# Patient Record
Sex: Female | Born: 1950
Health system: Southern US, Community
[De-identification: ages and names within clinical notes are randomized; demographics above are authoritative.]

## PROBLEM LIST (undated history)

## (undated) DIAGNOSIS — T7840XA Allergy, unspecified, initial encounter: Secondary | ICD-10-CM

## (undated) DIAGNOSIS — M545 Low back pain, unspecified: Secondary | ICD-10-CM

## (undated) DIAGNOSIS — E785 Hyperlipidemia, unspecified: Secondary | ICD-10-CM

## (undated) DIAGNOSIS — E1159 Type 2 diabetes mellitus with other circulatory complications: Secondary | ICD-10-CM

## (undated) DIAGNOSIS — K921 Melena: Secondary | ICD-10-CM

## (undated) DIAGNOSIS — R609 Edema, unspecified: Secondary | ICD-10-CM

## (undated) DIAGNOSIS — J302 Other seasonal allergic rhinitis: Secondary | ICD-10-CM

## (undated) DIAGNOSIS — M179 Osteoarthritis of knee, unspecified: Secondary | ICD-10-CM

## (undated) DIAGNOSIS — H8110 Benign paroxysmal vertigo, unspecified ear: Secondary | ICD-10-CM

## (undated) DIAGNOSIS — E669 Obesity, unspecified: Secondary | ICD-10-CM

## (undated) DIAGNOSIS — M1710 Unilateral primary osteoarthritis, unspecified knee: Secondary | ICD-10-CM

## (undated) DIAGNOSIS — K219 Gastro-esophageal reflux disease without esophagitis: Secondary | ICD-10-CM

## (undated) DIAGNOSIS — F32A Depression, unspecified: Secondary | ICD-10-CM

## (undated) DIAGNOSIS — E1169 Type 2 diabetes mellitus with other specified complication: Secondary | ICD-10-CM

## (undated) DIAGNOSIS — M7640 Tibial collateral bursitis [Pellegrini-Stieda], unspecified leg: Secondary | ICD-10-CM

## (undated) DIAGNOSIS — B029 Zoster without complications: Secondary | ICD-10-CM

## (undated) DIAGNOSIS — F329 Major depressive disorder, single episode, unspecified: Secondary | ICD-10-CM

## (undated) DIAGNOSIS — I10 Essential (primary) hypertension: Secondary | ICD-10-CM

## (undated) HISTORY — DX: Melena: K92.1

## (undated) HISTORY — DX: Low back pain: M54.5

## (undated) HISTORY — PX: OTHER SURGICAL HISTORY: SHX169

## (undated) HISTORY — PX: WISDOM TOOTH EXTRACTION: SHX21

## (undated) HISTORY — DX: Low back pain, unspecified: M54.50

## (undated) HISTORY — DX: Gastro-esophageal reflux disease without esophagitis: K21.9

## (undated) HISTORY — DX: Allergy, unspecified, initial encounter: T78.40XA

## (undated) HISTORY — DX: Unilateral primary osteoarthritis, unspecified knee: M17.10

## (undated) HISTORY — DX: Type 2 diabetes mellitus with other specified complication: E11.69

## (undated) HISTORY — DX: Essential (primary) hypertension: I10

## (undated) HISTORY — DX: Tibial collateral bursitis (Pellegrini-Stieda), unspecified leg: M76.40

## (undated) HISTORY — DX: Hyperlipidemia, unspecified: E78.5

## (undated) HISTORY — DX: Osteoarthritis of knee, unspecified: M17.9

## (undated) HISTORY — DX: Benign paroxysmal vertigo, unspecified ear: H81.10

## (undated) HISTORY — DX: Depression, unspecified: F32.A

## (undated) HISTORY — DX: Edema, unspecified: R60.9

## (undated) HISTORY — DX: Type 2 diabetes mellitus with other circulatory complications: E11.59

## (undated) HISTORY — DX: Obesity, unspecified: E66.9

## (undated) HISTORY — DX: Major depressive disorder, single episode, unspecified: F32.9

---

## 1975-08-28 HISTORY — PX: TONSILLECTOMY: SUR1361

## 1976-08-27 HISTORY — PX: TUBAL LIGATION: SHX77

## 1982-08-27 HISTORY — PX: ABDOMINAL HYSTERECTOMY: SHX81

## 1999-08-28 ENCOUNTER — Emergency Department (HOSPITAL_COMMUNITY): Admission: EM | Admit: 1999-08-28 | Discharge: 1999-08-28 | Payer: Self-pay | Admitting: Emergency Medicine

## 2000-09-20 ENCOUNTER — Emergency Department (HOSPITAL_COMMUNITY): Admission: EM | Admit: 2000-09-20 | Discharge: 2000-09-20 | Payer: Self-pay | Admitting: Emergency Medicine

## 2000-09-20 ENCOUNTER — Encounter: Payer: Self-pay | Admitting: Emergency Medicine

## 2001-11-30 ENCOUNTER — Emergency Department (HOSPITAL_COMMUNITY): Admission: EM | Admit: 2001-11-30 | Discharge: 2001-11-30 | Payer: Self-pay | Admitting: Emergency Medicine

## 2002-02-17 ENCOUNTER — Encounter: Payer: Self-pay | Admitting: Orthopedic Surgery

## 2002-02-23 ENCOUNTER — Inpatient Hospital Stay (HOSPITAL_COMMUNITY): Admission: RE | Admit: 2002-02-23 | Discharge: 2002-02-26 | Payer: Self-pay | Admitting: Orthopedic Surgery

## 2002-03-04 ENCOUNTER — Encounter: Admission: RE | Admit: 2002-03-04 | Discharge: 2002-05-07 | Payer: Self-pay | Admitting: Orthopedic Surgery

## 2002-03-06 ENCOUNTER — Ambulatory Visit (HOSPITAL_COMMUNITY): Admission: RE | Admit: 2002-03-06 | Discharge: 2002-03-06 | Payer: Self-pay | Admitting: Orthopedic Surgery

## 2002-03-23 ENCOUNTER — Inpatient Hospital Stay (HOSPITAL_COMMUNITY): Admission: RE | Admit: 2002-03-23 | Discharge: 2002-03-26 | Payer: Self-pay | Admitting: Orthopedic Surgery

## 2003-01-13 ENCOUNTER — Ambulatory Visit: Admission: RE | Admit: 2003-01-13 | Discharge: 2003-02-11 | Payer: Self-pay | Admitting: Radiation Oncology

## 2003-02-01 ENCOUNTER — Inpatient Hospital Stay (HOSPITAL_COMMUNITY): Admission: RE | Admit: 2003-02-01 | Discharge: 2003-02-05 | Payer: Self-pay | Admitting: Orthopedic Surgery

## 2003-02-10 ENCOUNTER — Encounter: Admission: RE | Admit: 2003-02-10 | Discharge: 2003-05-11 | Payer: Self-pay | Admitting: Orthopedic Surgery

## 2003-05-10 ENCOUNTER — Emergency Department (HOSPITAL_COMMUNITY): Admission: EM | Admit: 2003-05-10 | Discharge: 2003-05-11 | Payer: Self-pay | Admitting: Emergency Medicine

## 2003-05-12 ENCOUNTER — Encounter: Admission: RE | Admit: 2003-05-12 | Discharge: 2003-08-10 | Payer: Self-pay | Admitting: Orthopedic Surgery

## 2004-05-05 ENCOUNTER — Ambulatory Visit: Payer: Self-pay | Admitting: Internal Medicine

## 2004-06-01 ENCOUNTER — Ambulatory Visit (HOSPITAL_COMMUNITY): Admission: RE | Admit: 2004-06-01 | Discharge: 2004-06-01 | Payer: Self-pay | Admitting: Family Medicine

## 2004-08-04 ENCOUNTER — Ambulatory Visit: Payer: Self-pay | Admitting: Internal Medicine

## 2004-08-18 ENCOUNTER — Ambulatory Visit: Payer: Self-pay | Admitting: Internal Medicine

## 2004-09-01 ENCOUNTER — Encounter: Admission: RE | Admit: 2004-09-01 | Discharge: 2004-11-30 | Payer: Self-pay | Admitting: Internal Medicine

## 2004-12-01 ENCOUNTER — Ambulatory Visit: Payer: Self-pay | Admitting: Internal Medicine

## 2004-12-22 ENCOUNTER — Ambulatory Visit: Payer: Self-pay | Admitting: Internal Medicine

## 2005-01-09 ENCOUNTER — Ambulatory Visit: Payer: Self-pay | Admitting: Internal Medicine

## 2005-03-15 ENCOUNTER — Ambulatory Visit (HOSPITAL_COMMUNITY): Admission: RE | Admit: 2005-03-15 | Discharge: 2005-03-15 | Payer: Self-pay | Admitting: Internal Medicine

## 2005-03-15 ENCOUNTER — Ambulatory Visit: Payer: Self-pay

## 2005-06-04 ENCOUNTER — Ambulatory Visit (HOSPITAL_COMMUNITY): Admission: RE | Admit: 2005-06-04 | Discharge: 2005-06-04 | Payer: Self-pay | Admitting: Internal Medicine

## 2005-06-12 ENCOUNTER — Ambulatory Visit: Payer: Self-pay | Admitting: Internal Medicine

## 2005-06-18 ENCOUNTER — Ambulatory Visit: Payer: Self-pay | Admitting: Internal Medicine

## 2005-08-10 ENCOUNTER — Ambulatory Visit: Payer: Self-pay | Admitting: Internal Medicine

## 2005-10-11 ENCOUNTER — Ambulatory Visit: Payer: Self-pay | Admitting: Internal Medicine

## 2005-10-11 ENCOUNTER — Ambulatory Visit (HOSPITAL_COMMUNITY): Admission: RE | Admit: 2005-10-11 | Discharge: 2005-10-11 | Payer: Self-pay | Admitting: Internal Medicine

## 2005-11-17 ENCOUNTER — Emergency Department (HOSPITAL_COMMUNITY): Admission: EM | Admit: 2005-11-17 | Discharge: 2005-11-17 | Payer: Self-pay | Admitting: Emergency Medicine

## 2005-11-23 ENCOUNTER — Ambulatory Visit: Payer: Self-pay | Admitting: Hospitalist

## 2006-01-02 ENCOUNTER — Ambulatory Visit: Payer: Self-pay | Admitting: Internal Medicine

## 2006-05-22 ENCOUNTER — Ambulatory Visit: Payer: Self-pay | Admitting: Internal Medicine

## 2006-06-13 ENCOUNTER — Encounter (INDEPENDENT_AMBULATORY_CARE_PROVIDER_SITE_OTHER): Payer: Self-pay | Admitting: Pulmonary Disease

## 2006-06-13 ENCOUNTER — Ambulatory Visit: Payer: Self-pay | Admitting: Internal Medicine

## 2006-06-13 ENCOUNTER — Encounter (INDEPENDENT_AMBULATORY_CARE_PROVIDER_SITE_OTHER): Payer: Self-pay | Admitting: Internal Medicine

## 2006-06-13 LAB — CONVERTED CEMR LAB
Albumin: 4.2 g/dL (ref 3.5–5.2)
CO2: 30 meq/L (ref 19–32)
Glucose, Bld: 93 mg/dL (ref 70–99)
Nitrite: NEGATIVE
Potassium: 3.8 meq/L (ref 3.5–5.3)
Protein, ur: NEGATIVE mg/dL
Sodium: 147 meq/L — ABNORMAL HIGH (ref 135–145)
Total Protein: 7.3 g/dL (ref 6.0–8.3)
Urobilinogen, UA: 1 (ref 0.0–1.0)
WBC Urine, dipstick: NEGATIVE

## 2006-06-17 DIAGNOSIS — R609 Edema, unspecified: Secondary | ICD-10-CM

## 2006-06-17 DIAGNOSIS — Z9079 Acquired absence of other genital organ(s): Secondary | ICD-10-CM | POA: Insufficient documentation

## 2006-06-17 DIAGNOSIS — M1990 Unspecified osteoarthritis, unspecified site: Secondary | ICD-10-CM

## 2006-06-17 DIAGNOSIS — Z96659 Presence of unspecified artificial knee joint: Secondary | ICD-10-CM

## 2006-06-17 DIAGNOSIS — E1149 Type 2 diabetes mellitus with other diabetic neurological complication: Secondary | ICD-10-CM

## 2006-06-17 DIAGNOSIS — Z9089 Acquired absence of other organs: Secondary | ICD-10-CM | POA: Insufficient documentation

## 2006-06-20 ENCOUNTER — Encounter (INDEPENDENT_AMBULATORY_CARE_PROVIDER_SITE_OTHER): Payer: Self-pay | Admitting: Pulmonary Disease

## 2006-06-20 ENCOUNTER — Ambulatory Visit: Payer: Self-pay | Admitting: Internal Medicine

## 2006-06-20 LAB — CONVERTED CEMR LAB
Calcium: 9.5 mg/dL (ref 8.4–10.5)
Cholesterol: 139 mg/dL (ref 0–200)
HDL: 47 mg/dL (ref >39)
Sodium: 143 meq/L (ref 135–145)
Total CHOL/HDL Ratio: 3
Triglycerides: 87 mg/dL (ref <150)

## 2006-09-16 DIAGNOSIS — I10 Essential (primary) hypertension: Secondary | ICD-10-CM

## 2006-09-16 DIAGNOSIS — E785 Hyperlipidemia, unspecified: Secondary | ICD-10-CM

## 2006-09-16 DIAGNOSIS — K219 Gastro-esophageal reflux disease without esophagitis: Secondary | ICD-10-CM

## 2006-09-16 DIAGNOSIS — M545 Low back pain: Secondary | ICD-10-CM

## 2006-09-16 DIAGNOSIS — E1159 Type 2 diabetes mellitus with other circulatory complications: Secondary | ICD-10-CM | POA: Insufficient documentation

## 2006-09-20 ENCOUNTER — Telehealth (INDEPENDENT_AMBULATORY_CARE_PROVIDER_SITE_OTHER): Payer: Self-pay | Admitting: *Deleted

## 2006-09-20 ENCOUNTER — Telehealth: Payer: Self-pay | Admitting: *Deleted

## 2006-10-04 ENCOUNTER — Encounter (INDEPENDENT_AMBULATORY_CARE_PROVIDER_SITE_OTHER): Payer: Self-pay | Admitting: *Deleted

## 2006-10-04 ENCOUNTER — Ambulatory Visit: Payer: Self-pay | Admitting: Internal Medicine

## 2006-10-04 LAB — CONVERTED CEMR LAB
ALT: 22 units/L (ref 0–35)
BUN: 11 mg/dL (ref 6–23)
CO2: 28 meq/L (ref 19–32)
Calcium: 9.4 mg/dL (ref 8.4–10.5)
Chloride: 104 meq/L (ref 96–112)
Creatinine, Ser: 0.49 mg/dL (ref 0.40–1.20)
Hgb A1c MFr Bld: 6.1 %

## 2007-01-27 ENCOUNTER — Telehealth: Payer: Self-pay | Admitting: *Deleted

## 2007-02-06 ENCOUNTER — Telehealth: Payer: Self-pay | Admitting: *Deleted

## 2007-03-04 ENCOUNTER — Ambulatory Visit: Payer: Self-pay | Admitting: Internal Medicine

## 2007-03-04 LAB — CONVERTED CEMR LAB: Blood Glucose, Fingerstick: 102

## 2007-07-08 ENCOUNTER — Ambulatory Visit: Payer: Self-pay | Admitting: *Deleted

## 2007-07-08 LAB — CONVERTED CEMR LAB: Blood Glucose, Fingerstick: 97

## 2007-11-13 ENCOUNTER — Telehealth (INDEPENDENT_AMBULATORY_CARE_PROVIDER_SITE_OTHER): Payer: Self-pay | Admitting: Internal Medicine

## 2007-11-21 ENCOUNTER — Ambulatory Visit: Payer: Self-pay | Admitting: *Deleted

## 2008-05-17 ENCOUNTER — Telehealth (INDEPENDENT_AMBULATORY_CARE_PROVIDER_SITE_OTHER): Payer: Self-pay | Admitting: Internal Medicine

## 2008-05-27 ENCOUNTER — Ambulatory Visit: Payer: Self-pay | Admitting: Internal Medicine

## 2008-05-27 ENCOUNTER — Encounter: Payer: Self-pay | Admitting: Internal Medicine

## 2008-05-27 ENCOUNTER — Encounter (INDEPENDENT_AMBULATORY_CARE_PROVIDER_SITE_OTHER): Payer: Self-pay | Admitting: Internal Medicine

## 2008-05-27 LAB — CONVERTED CEMR LAB: Blood Glucose, Fingerstick: 101

## 2008-06-04 ENCOUNTER — Encounter: Payer: Self-pay | Admitting: Internal Medicine

## 2008-06-08 LAB — CONVERTED CEMR LAB
ALT: 29 units/L (ref 0–35)
Albumin: 4.3 g/dL (ref 3.5–5.2)
CO2: 26 meq/L (ref 19–32)
Calcium: 9.1 mg/dL (ref 8.4–10.5)
Chloride: 106 meq/L (ref 96–112)
Cholesterol: 190 mg/dL (ref 0–200)
Creatinine, Ser: 0.49 mg/dL (ref 0.40–1.20)
Total Protein: 7.1 g/dL (ref 6.0–8.3)

## 2009-02-14 ENCOUNTER — Telehealth: Payer: Self-pay | Admitting: *Deleted

## 2009-03-10 ENCOUNTER — Ambulatory Visit: Payer: Self-pay | Admitting: Internal Medicine

## 2009-03-10 ENCOUNTER — Encounter: Payer: Self-pay | Admitting: Internal Medicine

## 2009-03-10 DIAGNOSIS — F329 Major depressive disorder, single episode, unspecified: Secondary | ICD-10-CM

## 2009-03-10 LAB — CONVERTED CEMR LAB
ALT: 26 units/L (ref 0–35)
ALT: 26 units/L (ref 0–35)
AST: 17 units/L (ref 0–37)
Albumin: 4.2 g/dL (ref 3.5–5.2)
Alkaline Phosphatase: 63 units/L (ref 39–117)
Blood Glucose, Fingerstick: 155
CO2: 25 meq/L (ref 19–32)
Calcium: 9.2 mg/dL (ref 8.4–10.5)
Calcium: 9.2 mg/dL (ref 8.4–10.5)
Chloride: 105 meq/L (ref 96–112)
Chloride: 105 meq/L (ref 96–112)
Cholesterol: 129 mg/dL (ref 0–200)
Creatinine, Ser: 0.5 mg/dL (ref 0.40–1.20)
HDL: 45 mg/dL (ref >39)
LDL Cholesterol: 62 mg/dL (ref 0–99)
Microalb, Ur: 1.85 mg/dL (ref 0.00–1.89)
Potassium: 4.2 meq/L (ref 3.5–5.3)
Potassium: 4.2 meq/L (ref 3.5–5.3)
Sodium: 142 meq/L (ref 135–145)
Total CHOL/HDL Ratio: 2.9
Total CHOL/HDL Ratio: 2.9
Total Protein: 7 g/dL (ref 6.0–8.3)
VLDL: 22 mg/dL (ref 0–40)
VLDL: 22 mg/dL (ref 0–40)

## 2009-07-14 ENCOUNTER — Encounter: Payer: Self-pay | Admitting: Internal Medicine

## 2009-07-14 ENCOUNTER — Ambulatory Visit: Payer: Self-pay | Admitting: Internal Medicine

## 2009-07-14 LAB — CONVERTED CEMR LAB: Blood Glucose, Fingerstick: 97

## 2009-07-25 ENCOUNTER — Telehealth: Payer: Self-pay | Admitting: Internal Medicine

## 2009-08-10 ENCOUNTER — Encounter (INDEPENDENT_AMBULATORY_CARE_PROVIDER_SITE_OTHER): Payer: Self-pay | Admitting: *Deleted

## 2009-11-23 ENCOUNTER — Ambulatory Visit: Payer: Self-pay | Admitting: Internal Medicine

## 2009-11-23 ENCOUNTER — Encounter: Payer: Self-pay | Admitting: Internal Medicine

## 2010-03-28 ENCOUNTER — Encounter: Payer: Self-pay | Admitting: Internal Medicine

## 2010-03-28 ENCOUNTER — Ambulatory Visit: Payer: Self-pay | Admitting: Internal Medicine

## 2010-03-28 LAB — CONVERTED CEMR LAB
Blood Glucose, Fingerstick: 96
Hgb A1c MFr Bld: 7 %

## 2010-04-04 ENCOUNTER — Telehealth: Payer: Self-pay | Admitting: Internal Medicine

## 2010-04-05 ENCOUNTER — Ambulatory Visit: Payer: Self-pay | Admitting: Internal Medicine

## 2010-04-05 ENCOUNTER — Encounter: Payer: Self-pay | Admitting: Internal Medicine

## 2010-04-05 DIAGNOSIS — H00029 Hordeolum internum unspecified eye, unspecified eyelid: Secondary | ICD-10-CM

## 2010-04-05 LAB — CONVERTED CEMR LAB
AST: 17 units/L (ref 0–37)
Albumin: 3.8 g/dL (ref 3.5–5.2)
Alkaline Phosphatase: 66 units/L (ref 39–117)
Bilirubin, Direct: <0.1 mg/dL (ref 0.0–0.3)
CO2: 26 meq/L (ref 19–32)
Chloride: 106 meq/L (ref 96–112)
Creatinine, Ser: 0.46 mg/dL (ref 0.40–1.20)
HCT: 38.7 % (ref 36.0–46.0)
Hemoglobin: 13.2 g/dL (ref 12.0–15.0)
LDL Cholesterol: 95 mg/dL (ref 0–99)
MCHC: 34.1 g/dL (ref 30.0–36.0)
MCV: 89.6 fL (ref >=78.0)
Potassium: 3.6 meq/L (ref 3.5–5.3)
RBC: 4.32 M/uL (ref 3.87–5.11)
RDW: 13.4 % (ref 11.5–15.5)
Total Bilirubin: 0.7 mg/dL (ref 0.3–1.2)
Triglycerides: 108 mg/dL (ref <150)
VLDL: 22 mg/dL (ref 0–40)

## 2010-05-11 ENCOUNTER — Ambulatory Visit (HOSPITAL_COMMUNITY): Admission: RE | Admit: 2010-05-11 | Discharge: 2010-05-11 | Payer: Self-pay | Admitting: Internal Medicine

## 2010-07-18 ENCOUNTER — Ambulatory Visit: Payer: Self-pay | Admitting: Internal Medicine

## 2010-07-18 ENCOUNTER — Encounter: Payer: Self-pay | Admitting: Internal Medicine

## 2010-07-18 LAB — CONVERTED CEMR LAB
Hgb A1c MFr Bld: 6.5 %
Microalb Creat Ratio: 60.8 mg/g — ABNORMAL HIGH (ref 0.0–30.0)

## 2010-08-07 ENCOUNTER — Ambulatory Visit: Payer: Self-pay | Admitting: Family Medicine

## 2010-09-26 NOTE — Assessment & Plan Note (Signed)
Summary: EST-CK/FU/MEDS/CFB   Vital Signs:  Patient profile:   60 year old female Height:      65 inches (165.10 cm) Weight:      190.9 pounds (86.77 kg) BMI:     31.88 Temp:     98.0 degrees F (36.67 degrees C) oral Pulse rate:   75 / minute BP sitting:   111 / 73  (right arm)  Vitals Entered By: Chinita Pester RN (March 28, 2010 2:30 PM) CC: Check-up. Knee injection. Right groin tenderness. Dizziness last week with no energy and no appetite. Is Patient Diabetic? Yes Did you bring your meter with you today? No Pain Assessment Patient in pain? no      Nutritional Status BMI of > 30 = obese CBG Result 96  Have you ever been in a relationship where you felt threatened, hurt or afraid?Unable to ask   Does patient need assistance? Functional Status Self care Ambulation Normal   Diabetic Foot Exam Last Podiatry Exam Date: 11/21/2007 Foot Inspection Is there a history of a foot ulcer?              No Is there a foot ulcer now?              No Is there swelling or an abnormal foot shape?          No Are the toenails long?                No Are the toenails thick?                No Are the toenails ingrown?              No Is there heavy callous build-up?              No Is there pain in the calf muscle (Intermittent claudication) when walking?    NoIs there a claw toe deformity?              No Is there elevated skin temperature?            No Is there limited ankle dorsiflexion?            No Is there foot or ankle muscle weakness?            No  Diabetic Foot Care Education Pulse Check          Right Foot          Left Foot Posterior Tibial:        normal            normal Dorsalis Pedis:        normal            normal  High Risk Feet? No   Primary Care Provider:  [id number]  CC:  Check-up. Knee injection. Right groin tenderness. Dizziness last week with no energy and no appetite.Marland Kitchen  History of Present Illness: Follow up appointment: 1. DM --lost  10 lbs since  11/20/2009 by exercising and watching her diet. Denies any concerns. 2. Left knee OA --requests  a "steroid injection." Last one was 10/2009 --"help her a great deal."  Depression History:      The patient denies a depressed mood most of the day and a diminished interest in her usual daily activities.  The patient denies significant weight loss, significant weight gain, insomnia, hypersomnia, psychomotor agitation, psychomotor retardation, fatigue (loss of energy), feelings of worthlessness (guilt), impaired concentration (indecisiveness),  and recurrent thoughts of death or suicide.         Preventive Screening-Counseling & Management  Alcohol-Tobacco     Alcohol drinks/day: 0     Smoking Status: never  Caffeine-Diet-Exercise     Does Patient Exercise: yes     Type of exercise: walking     Exercise (avg: min/session): <30     Times/week: 3  Problems Prior to Update: 1)  Preventive Health Care  (ICD-V70.0) 2)  Depressive Disorder  (ICD-311) 3)  Aftercare, Long-term Use, Medications Nec  (ICD-V58.69) 4)  Antihyperlipidemic Use, Long Term  (ICD-V58.69) 5)  Low Back Pain  (ICD-724.2) 6)  Hypertension  (ICD-401.9) 7)  Hyperlipidemia  (ICD-272.4) 8)  Gerd  (ICD-530.81) 9)  Dependent Edema, Legs, Bilateral  (ICD-782.3) 10)  Osteoarthritis  (ICD-715.90) 11)  Knee Replacement, Hx of  (ICD-V43.65) 12)  Diabetes Mellitus, Type II  (ICD-250.00) 13)  Tubal Ligation, Hx of  (ICD-V26.51) 14)  Tonsillectomy, Hx of  (ICD-V45.79) 15)  Hysterectomy, Hx of  (ICD-V45.77)  Current Problems (verified): 1)  Special Screening For Malignant Neoplasms Colon  (ICD-V76.51) 2)  Preventive Health Care  (ICD-V70.0) 3)  Depressive Disorder  (ICD-311) 4)  Aftercare, Long-term Use, Medications Nec  (ICD-V58.69) 5)  Antihyperlipidemic Use, Long Term  (ICD-V58.69) 6)  Low Back Pain  (ICD-724.2) 7)  Hypertension  (ICD-401.9) 8)  Hyperlipidemia  (ICD-272.4) 9)  Gerd  (ICD-530.81) 10)  Dependent Edema, Legs,  Bilateral  (ICD-782.3) 11)  Osteoarthritis  (ICD-715.90) 12)  Knee Replacement, Hx of  (ICD-V43.65) 13)  Diabetes Mellitus, Type II  (ICD-250.00) 14)  Tubal Ligation, Hx of  (ICD-V26.51) 15)  Tonsillectomy, Hx of  (ICD-V45.79) 16)  Hysterectomy, Hx of  (ICD-V45.77)  Medications Prior to Update: 1)  Metformin Hcl 1000 Mg Tabs (Metformin Hcl) .... Take 1 Tablet By Mouth Two Times A Day 2)  Lisinopril 20 Mg Tabs (Lisinopril) .... Take 1 Tablet By Mouth Once A Day 3)  Simvastatin 40 Mg Tabs (Simvastatin) .... Take 1 Tab By Mouth At Bedtime 4)  Lasix 40 Mg Tabs (Furosemide) .... Take 1 Tablet By Mouth Once A Day 5)  Prilosec Otc 20 Mg  Tbec (Omeprazole Magnesium) .... Take 1 Tablet By Mouth Once A Day As Needed 6)  Tylenol Arthritis Pain 650 Mg Cr-Tabs (Acetaminophen) .... Take 1 Tablet By Mouth Four Times A Day As Needed 7)  Aspirin 81 Mg Chew (Aspirin) .... Take 1 Tablet By Mouth Once A Day With Meals For Heart Health 8)  Tylenol With Codeine #3 300-30 Mg Tabs (Acetaminophen-Codeine) .... Take One Tablet Four Times A Day As Needed  Current Medications (verified): 1)  Metformin Hcl 1000 Mg Tabs (Metformin Hcl) .... Take 1 Tablet By Mouth Two Times A Day 2)  Lisinopril 20 Mg Tabs (Lisinopril) .... Take 1 Tablet By Mouth Once A Day 3)  Simvastatin 40 Mg Tabs (Simvastatin) .... Take 1 Tab By Mouth At Bedtime 4)  Lasix 40 Mg Tabs (Furosemide) .... Take 1 Tablet By Mouth Once A Day 5)  Prilosec Otc 20 Mg  Tbec (Omeprazole Magnesium) .... Take 1 Tablet By Mouth Once A Day As Needed 6)  Tylenol Arthritis Pain 650 Mg Cr-Tabs (Acetaminophen) .... Take 1 Tablet By Mouth Four Times A Day As Needed 7)  Aspirin 81 Mg Chew (Aspirin) .... Take 1 Tablet By Mouth Once A Day With Meals For Heart Health 8)  Tylenol With Codeine #3 300-30 Mg Tabs (Acetaminophen-Codeine) .... Take One Tablet Four Times A Day As Needed  Allergies (verified): No Known Drug Allergies  Directives (verified): 1)  Full  Code   Past History:  Past Medical History: Last updated: 03/10/2009 Diabetes mellitus, type II dx0ed 2005 Osteoarthritis bilateral knees, s/p 3 total knee replacements on right (last 2005), Dr Shelle Iron GERD Hyperlipidemia Hypertension Low back pain Obesity Pellegrini-Stieda myositis ossificans  Past Surgical History: Last updated: 03/10/2009 Total knee replacement X 2 on right knee,  in 01/2002; and in 11/2005 Hysterectomy 1984 r/t DUB Tubal ligation Tonsillectomy  Family History: Last updated: 03/10/2009 Mother DM, HTN FAther Lung CA, CAD, HTN  Social History: Last updated: 03/10/2009 Married, 3 daughters. Works at a [employer]: completed high school Nikotine: never ETOH: no Drugs: no  Risk Factors: Alcohol Use: 0 (03/28/2010) Exercise: yes (03/28/2010)  Risk Factors: Smoking Status: never (03/28/2010)  Review of Systems       per HPI  Physical Exam  General:  alert, well-nourished, well-hydrated, and overweight-appearing.  alert, well-nourished, and well-hydrated.   Head:  no abnormalities observed.  no abnormalities observed.   Eyes:  PERRLA bialterally, no icterus bilaterally. Ears:  R ear normal and L ear normal.   Nose:  no external deformity and no nasal discharge.   Mouth:  good dentition, no gingival abnormalities, pharynx pink and moist, no erythema, and no exudates.   Neck:  supple, full ROM, and no masses.   Chest Wall:  no deformities.   Lungs:  normal respiratory effort and normal breath sounds.  no crackles and no wheezes.   Heart:  normal rate, regular rhythm, no murmur, and no JVD.   Abdomen:  Multiple old silvery striae; soft, non-tender, normal bowel sounds, no masses, and no hepatomegaly.   Msk:  Left knee valgus deformity is present; mild crepitus with flexionand extension; no redness over joints and no joint instability.   Pulses:  Pedal pulses 2+/4 bilaterally Extremities:  No edema or cyanosis  bilaterally. Neurologic:  alert & oriented X3  Diabetes Management Exam:    Foot Exam (with socks and/or shoes not present):       Sensory-Pinprick/Light touch:          Left medial foot (L-4): normal          Left dorsal foot (L-5): normal          Left lateral foot (S-1): normal          Right medial foot (L-4): normal          Right dorsal foot (L-5): normal          Right lateral foot (S-1): normal       Sensory-Monofilament:          Left foot: normal          Right foot: normal       Inspection:          Left foot: normal          Right foot: normal       Nails:          Left foot: normal          Right foot: normal    Eye Exam:       Eye Exam done elsewhere   Impression & Recommendations:  Problem # 1:  HYPERTENSION (ICD-401.9) Controlled. Her updated medication list for this problem includes:    Lisinopril 20 Mg Tabs (Lisinopril) .Marland Kitchen... Take 1 tablet by mouth once a day    Lasix 40 Mg Tabs (Furosemide) .Marland Kitchen... Take 1 tablet  by mouth once a day  BP today: 111/73 Prior BP: 114/73 (11/23/2009)  Labs Reviewed: K+: 4.2 (03/10/2009) Creat: : 0.50 (03/10/2009)   Chol: 129 (03/10/2009)   HDL: 45 (03/10/2009)   LDL: 62 (03/10/2009)   TG: 109 (03/10/2009)  Problem # 2:  OSTEOARTHRITIS (ICD-715.90)  Steroid injection with 40 mg/ml 1 ml left knee injection is given. Written concent obtained. All questions answere in full Her update medication list for this problem includes:    Tylenol Arthritis Pain 650 Mg Cr-tabs (Acetaminophen) .Marland Kitchen... Take 1 tablet by mouth four times a day as needed    Aspirin 81 Mg Chew (Aspirin) .Marland Kitchen... Take 1 tablet by mouth once a day with meals for heart health    Tylenol With Codeine #3 300-30 Mg Tabs (Acetaminophen-codeine) .Marland Kitchen... Take one tablet four times a day as needed  Discussed use of medications, application of heat or cold, and exercises.   Orders: Triamcinolone (Kenalog) 10mg  (J3301)  Problem # 3:  DIABETES MELLITUS, TYPE II  (ICD-250.00) Controlled. Diet, exercise and diet reviewed. Her updated medication list for this problem includes:    Metformin Hcl 1000 Mg Tabs (Metformin hcl) .Marland Kitchen... Take 1 tablet by mouth two times a day    Lisinopril 20 Mg Tabs (Lisinopril) .Marland Kitchen... Take 1 tablet by mouth once a day    Aspirin 81 Mg Chew (Aspirin) .Marland Kitchen... Take 1 tablet by mouth once a day with meals for heart health  Orders: T- Capillary Blood Glucose ([address]) T-Hgb A1C (in-house) ([id number])  Labs Reviewed: Creat: 0.50 (03/10/2009)     Last Eye Exam: normal (11/23/2009) Reviewed HgBA1c results: 7.0 (03/28/2010)  6.9 (11/23/2009)  Complete Medication List: 1)  Metformin Hcl 1000 Mg Tabs (Metformin hcl) .... Take 1 tablet by mouth two times a day 2)  Lisinopril 20 Mg Tabs (Lisinopril) .... Take 1 tablet by mouth once a day 3)  Simvastatin 40 Mg Tabs (Simvastatin) .... Take 1 tab by mouth at bedtime 4)  Lasix 40 Mg Tabs (Furosemide) .... Take 1 tablet by mouth once a day 5)  Prilosec Otc 20 Mg Tbec (Omeprazole magnesium) .... Take 1 tablet by mouth once a day as needed 6)  Tylenol Arthritis Pain 650 Mg Cr-tabs (Acetaminophen) .... Take 1 tablet by mouth four times a day as needed 7)  Aspirin 81 Mg Chew (Aspirin) .... Take 1 tablet by mouth once a day with meals for heart health 8)  Tylenol With Codeine #3 300-30 Mg Tabs (Acetaminophen-codeine) .... Take one tablet four times a day as needed  Other Orders: Joint Aspirate / Injection, Intermediate ([address]) Mammogram (Screening) (Mammo) Gastroenterology Referral (GI)  Patient Instructions: 1)  Please, come in in 3 months or sooner. 2)  Call with any questions. Prescriptions: TYLENOL WITH CODEINE #3 300-30 MG TABS (ACETAMINOPHEN-CODEINE) Take one tablet four times a day as needed  #60 x 2   Entered and Authorized by:   Deatra Robinson MD   Signed by:   Deatra Robinson MD on 03/28/2010   Method used:   Print then Give to Patient   RxID:   [id number] ASPIRIN 81 MG  CHEW (ASPIRIN) Take 1 tablet by mouth once a day with meals for heart health  #30 x 11   Entered and Authorized by:   Deatra Robinson MD   Signed by:   Deatra Robinson MD on 03/28/2010   Method used:   Faxed to ...       [facility] [phone]* (retail)  [address]       [place], [place]  [address]       Ph: [phone]       Fax: [phone]   RxID:   [id number] TYLENOL ARTHRITIS PAIN 650 MG CR-TABS (ACETAMINOPHEN) Take 1 tablet by mouth four times a day as needed  #90 x 11   Entered and Authorized by:   Deatra Robinson MD   Signed by:   Deatra Robinson MD on 03/28/2010   Method used:   Faxed to ...       [facility] [address] [phone]* (retail)       [address]       [place], [place]  [address]       Ph: [phone]       Fax: [phone]   RxID:   [id number] PRILOSEC OTC 20 MG  TBEC (OMEPRAZOLE MAGNESIUM) Take 1 tablet by mouth once a day as needed  #30 x 3   Entered and Authorized by:   Deatra Robinson MD   Signed by:   Deatra Robinson MD on 03/28/2010   Method used:   Faxed to ...       [facility] [address] [phone]* (retail)       [address]       [place], [place]  [address]       Ph: [phone]       Fax: [phone]   RxID:   [id number] LASIX 40 MG TABS (FUROSEMIDE) Take 1 tablet by mouth once a day  #90 x 3   Entered and Authorized by:   Deatra Robinson MD   Signed by:   Deatra Robinson MD on 03/28/2010   Method used:   Faxed to ...       [facility] [address] [phone]* (retail)       [address]       [place], [place]  [address]       Ph: [phone]       Fax: [phone]   RxID:   [id number] SIMVASTATIN 40 MG TABS (SIMVASTATIN) Take 1 tab by mouth at bedtime  #90 x 3   Entered and Authorized by:   Deatra Robinson MD   Signed by:   Deatra Robinson MD on 03/28/2010   Method used:   Faxed to ...       [facility] [address] [phone]* (retail)       [address]       [place], [place]  [address]       Ph: [phone]       Fax:  [phone]   RxID:   [id number] LISINOPRIL 20 MG TABS (LISINOPRIL) Take 1 tablet by mouth once a day  #90 x 3   Entered and Authorized by:   Deatra Robinson MD   Signed by:   Deatra Robinson MD on 03/28/2010   Method used:   Faxed to ...       [facility] [address] [phone]* (retail)       [address]       [place], [place]  [address]       Ph: [phone]       Fax: [phone]   RxID:   [id number] METFORMIN HCL 1000 MG TABS (METFORMIN HCL) Take 1 tablet by mouth two times a day  #180 Each x 3   Entered and Authorized by:   Deatra Robinson MD   Signed by:   Deatra Robinson MD on 03/28/2010   Method used:   Arneta Cliche  to ...       [facility] [address] [phone]* (retail)       [address]       [place], [place]  [address]       Ph: [phone]       Fax: [phone]   RxID:   [id number]   Prevention & Chronic Care Immunizations   Influenza vaccine: Fluvax 3+  (07/14/2009)   Influenza vaccine deferral: Deferred  (03/28/2010)   Influenza vaccine due: 04/27/2010    Tetanus booster: Not documented   Td booster deferral: Deferred  (03/28/2010)   Tetanus booster due: 03/11/2019    Pneumococcal vaccine: Not documented   Pneumococcal vaccine due: 03/13/2015    H. zoster vaccine: Not documented   H. zoster vaccine deferral: Not available  (03/28/2010)  Colorectal Screening   Hemoccult: Not documented    Colonoscopy: Not documented   Colonoscopy action/deferral: GI referral  (03/28/2010)  Other Screening   Pap smear: Not documented   Pap smear action/deferral: Not indicated S/P hysterectomy  (03/28/2010)    Mammogram: Not documented   Mammogram action/deferral: Ordered  (03/28/2010)    DXA bone density scan: Not documented   Smoking status: never  (03/28/2010)  Diabetes Mellitus   HgbA1C: 7.0  (03/28/2010)   Hemoglobin A1C due: 02/21/2008    Eye exam: normal  (11/23/2009)   Eye exam due: 11/2010    Foot exam: yes  (03/28/2010)   Foot exam  action/deferral: Do today   High risk foot: No  (03/28/2010)   Foot care education: Done  (03/10/2009)   Foot exam due: 03/10/2010    Urine microalbumin/creatinine ratio: 28.0  (03/10/2009)  Lipids   Total Cholesterol: 129  (03/10/2009)   LDL: 62  (03/10/2009)   LDL Direct: Not documented   HDL: 45  (03/10/2009)   Triglycerides: 109  (03/10/2009)   Lipid panel due: 06/21/2007    SGOT (AST): 17  (03/10/2009)   SGPT (ALT): 26  (03/10/2009)   Alkaline phosphatase: 63  (03/10/2009)   Total bilirubin: 0.5  (03/10/2009)  Hypertension   Last Blood Pressure: 111 / 73  (03/28/2010)   Serum creatinine: 0.50  (03/10/2009)   Serum potassium 4.2  (03/10/2009)  Self-Management Support :    Patient will work on the following items until the next clinic visit to reach self-care goals:     Medications and monitoring: bring all of my medications to every visit, examine my feet every day  (03/28/2010)     Eating: eat more vegetables, use fresh or frozen vegetables, eat foods that are low in salt, eat baked foods instead of fried foods  (03/28/2010)     Activity: take a 30 minute walk every day  (11/23/2009)    Diabetes self-management support: Written self-care plan  (03/28/2010)   Diabetes care plan printed    Hypertension self-management support: Written self-care plan  (03/28/2010)   Hypertension self-care plan printed.    Lipid self-management support: Written self-care plan  (03/28/2010)   Lipid self-care plan printed.   Nursing Instructions: Give Pneumovax today GI referral for screening colonoscopy (see order) Schedule screening mammogram (see order) Diabetic foot exam today     Laboratory Results   Blood Tests   Date/Time Received: March 28, 2010 2:45 PM  Date/Time Reported: Burke Keels  March 28, 2010 2:45 PM   HGBA1C: 7.0%   (Normal Range: Non-Diabetic - 3-6%   Control Diabetic - 6-8%) CBG Random:: 96mg /dL

## 2010-09-26 NOTE — Miscellaneous (Signed)
Summary: [facility] INFORMED CONSENT FOR MEDICAL/SURGICAL  [facility] INFORMED CONSENT FOR MEDICAL/SURGICAL   Imported By: Margie Billet 11/25/2009 11:36:40  _____________________________________________________________________  External Attachment:    Type:   Image     Comment:   External Document

## 2010-09-26 NOTE — Assessment & Plan Note (Signed)
Summary: CHECKUP/SB.   Vital Signs:  Patient profile:   60 year old female Height:      65 inches (165.10 cm) Weight:      197.6 pounds (89.82 kg) BMI:     33.00 Temp:     98.6 degrees F oral Pulse rate:   74 / minute BP sitting:   129 / 74  (right arm) Cuff size:   regular  Vitals Entered By: Chinita Pester RN (July 18, 2010 2:38 PM) CC: Check-up. Flu shot.  Steroid injection left knee. Is Patient Diabetic? Yes Did you bring your meter with you today? No Pain Assessment Patient in pain? yes     Location: left knee Intensity: 8 Type: sharp Onset of pain  Constant Nutritional Status BMI of > 30 = obese CBG Result 94  Have you ever been in a relationship where you felt threatened, hurt or afraid?No   Does patient need assistance? Functional Status Self care Ambulation Normal   CC:  Check-up. Flu shot.  Steroid injection left knee..  Depression History:      The patient denies a depressed mood most of the day and a diminished interest in her usual daily activities.         Preventive Screening-Counseling & Management  Alcohol-Tobacco     Alcohol drinks/day: 0     Smoking Status: never  Caffeine-Diet-Exercise     Does Patient Exercise: no  Allergies: No Known Drug Allergies  Social History: Does Patient Exercise:  no   Impression & Recommendations:  Problem # 1:  HYPERTENSION (ICD-401.9) Assessment Unchanged Stable. Low sodium diet discussed. Her updated medication list for this problem includes:    Lisinopril 20 Mg Tabs (Lisinopril) .Marland Kitchen... Take 1 tablet by mouth once a day    Lasix 40 Mg Tabs (Furosemide) .Marland Kitchen... Take 1 tablet by mouth once a day  Orders: Sports Medicine (Sports Med)  BP today: 129/74 Prior BP: 111/67 (04/05/2010)  Labs Reviewed: K+: 3.6 (04/05/2010) Creat: : 0.46 (04/05/2010)   Chol: 163 (04/05/2010)   HDL: 46 (04/05/2010)   LDL: 95 (04/05/2010)   TG: 108 (04/05/2010)  Problem # 2:  OSTEOARTHRITIS (ICD-715.90) Assessment:  Unchanged Per patient's requests and written consent, Left knee injected with 2 cc of dexamethasone and 2 cc of 1% lidocaine under Dr. Lamar Blinks supervision. patient tolearted procedure without any complications. Her updated medication list for this problem includes:     Aspirin 81 Mg Chew (Aspirin) .Marland Kitchen... Take 1 tablet by mouth once a day with meals for heart health    Tylenol With Codeine #3 300-30 Mg Tabs (Acetaminophen-codeine) .Marland Kitchen... Take one tablet four times a day as needed  Orders: Sports Medicine (Sports Med)  Discussed use of medications, application of heat or cold, and exercises.   Problem # 3:  DIABETES MELLITUS, TYPE II (ICD-250.00) Assessment: Unchanged Cconsider to decrease metformin down to 500 mg Po daily. Her updated medication list for this problem includes:    Metformin Hcl 1000 Mg Tabs (Metformin hcl) .Marland Kitchen... Take 1 tablet by mouth two times a day    Lisinopril 20 Mg Tabs (Lisinopril) .Marland Kitchen... Take 1 tablet by mouth once a day    Aspirin 81 Mg Chew (Aspirin) .Marland Kitchen... Take 1 tablet by mouth once a day with meals for heart health  Orders: T- Capillary Blood Glucose (16109) T-Hgb A1C (in-house) (60454UJ) T-Urine Microalbumin w/creat. ratio [phone])  Labs Reviewed: Creat: 0.46 (04/05/2010)     Last Eye Exam: normal (11/23/2009) Reviewed HgBA1c results: 6.5 (07/18/2010)  7.0 (  03/28/2010)  Complete Medication List: 1)  Metformin Hcl 1000 Mg Tabs (Metformin hcl) .... Take 1 tablet by mouth two times a day 2)  Lisinopril 20 Mg Tabs (Lisinopril) .... Take 1 tablet by mouth once a day 3)  Simvastatin 40 Mg Tabs (Simvastatin) .... Take 1 tab by mouth at bedtime 4)  Lasix 40 Mg Tabs (Furosemide) .... Take 1 tablet by mouth once a day 5)  Prilosec Otc 20 Mg Tbec (Omeprazole magnesium) .... Take 1 tablet by mouth once a day as needed 6)  Aspirin 81 Mg Chew (Aspirin) .... Take 1 tablet by mouth once a day with meals for heart health 7)  Tylenol With Codeine #3 300-30 Mg  Tabs (Acetaminophen-codeine) .... Take one tablet four times a day as needed  Other Orders: Tdap => 77yrs IM (16109) Pneumococcal Vaccine (60454) Admin 1st Vaccine (09811) Admin of Any Addtl Vaccine (91478) Influenza Vaccine NON MCR (29562)  Patient Instructions: 1)  Please, follow up with your referral for Synvisc injections at Sports Medicine. 2)  Please, return to clinic in 6 months or sooner. 3)  Call with any questions. 4)  Your next fasting blood work is in August, 20012 Prescriptions: TYLENOL WITH CODEINE #3 300-30 MG TABS (ACETAMINOPHEN-CODEINE) Take one tablet four times a day as needed  #60 x 2   Entered and Authorized by:   Deatra Robinson MD   Signed by:   Deatra Robinson MD on 07/18/2010   Method used:   Print then Give to Patient   RxID:   [id number]    Orders Added: 1)  T- Capillary Blood Glucose [82948] 2)  T-Hgb A1C (in-house) [84132GM] 3)  Sports Medicine [Sports Med] 4)  Est. Patient Level IV [01027] 5)  Tdap => 28yrs IM [90715] 6)  Pneumococcal Vaccine [90732] 7)  Admin 1st Vaccine [90471] 8)  Admin of Any Addtl Vaccine [90472] 9)  Influenza Vaccine NON MCR [00028] 10)  T-Urine Microalbumin w/creat. ratio [[id number]]   Immunizations Administered:  Tetanus Vaccine:    Vaccine Type: Tdap    Site: right deltoid    Mfr: GlaxoSmithKline    Dose: 0.5 ml    Route: IM    Given by: Chinita Pester RN    Exp. Date: 06/15/2012    Lot #: [id number]    VIS given: 07/14/08 version given July 18, 2010.  Pneumonia Vaccine:    Vaccine Type: Pneumovax    Site: left deltoid    Mfr: Merck    Dose: 0.5 ml    Route: IM    Given by: Chinita Pester RN    Exp. Date: 12/22/2011    Lot #: [id number]    VIS given: 08/01/09 version given July 18, 2010.  Influenza Vaccine # 1:    Vaccine Type: Fluvax Non-MCR    Site: left deltoid    Mfr: GlaxoSmithKline    Dose: 0.5 ml    Route: IM    Given by: Chinita Pester RN    Exp. Date: 02/24/2011    Lot #:  [id number]    VIS given: 03/21/10 version given July 18, 2010.  Flu Vaccine Consent Questions:    Do you have a history of severe allergic reactions to this vaccine? no    Any prior history of allergic reactions to egg and/or gelatin? no    Do you have a sensitivity to the preservative Thimersol? no    Do you have a past history of Guillan-Barre Syndrome? no    Do you currently have an acute  febrile illness? no    Have you ever had a severe reaction to latex? no    Vaccine information given and explained to patient? yes    Are you currently pregnant? no   Immunizations Administered:  Tetanus Vaccine:    Vaccine Type: Tdap    Site: right deltoid    Mfr: GlaxoSmithKline    Dose: 0.5 ml    Route: IM    Given by: Chinita Pester RN    Exp. Date: 06/15/2012    Lot #: [id number]    VIS given: 07/14/08 version given July 18, 2010.  Pneumonia Vaccine:    Vaccine Type: Pneumovax    Site: left deltoid    Mfr: Merck    Dose: 0.5 ml    Route: IM    Given by: Chinita Pester RN    Exp. Date: 12/22/2011    Lot #: [id number]    VIS given: 08/01/09 version given July 18, 2010.  Influenza Vaccine # 1:    Vaccine Type: Fluvax Non-MCR    Site: left deltoid    Mfr: GlaxoSmithKline    Dose: 0.5 ml    Route: IM    Given by: Chinita Pester RN    Exp. Date: 02/24/2011    Lot #: [id number]    VIS given: 03/21/10 version given July 18, 2010.  Prevention & Chronic Care Immunizations   Influenza vaccine: Fluvax Non-MCR  (07/18/2010)   Influenza vaccine deferral: Deferred  (03/28/2010)   Influenza vaccine due: 04/28/2011    Tetanus booster: 07/18/2010: Tdap   Td booster deferral: Deferred  (03/28/2010)   Tetanus booster due: 03/11/2019    Pneumococcal vaccine: Pneumovax  (07/18/2010)   Pneumococcal vaccine due: 03/13/2015    H. zoster vaccine: Not documented   H. zoster vaccine deferral: Not available  (03/28/2010)  Colorectal Screening   Hemoccult: Not documented    Hemoccult action/deferral: Not indicated  (07/18/2010)    Colonoscopy: Not documented   Colonoscopy action/deferral: GI referral  (03/28/2010)  Other Screening   Pap smear: Not documented   Pap smear action/deferral: Not indicated S/P hysterectomy  (03/28/2010)    Mammogram: ASSESSMENT: Negative - BI-RADS 1^MS DIGITAL SCREENING  (05/11/2010)   Mammogram action/deferral: Ordered  (03/28/2010)    DXA bone density scan: Not documented   Smoking status: never  (07/18/2010)  Diabetes Mellitus   HgbA1C: 6.5  (07/18/2010)   Hemoglobin A1C due: 01/16/2011    Eye exam: normal  (11/23/2009)   Eye exam due: 11/2010    Foot exam: yes  (04/05/2010)   Foot exam action/deferral: Do today   High risk foot: No  (03/28/2010)   Foot care education: Done  (03/10/2009)   Foot exam due: 07/19/2011    Urine microalbumin/creatinine ratio: 28.0  (03/10/2009)   Urine microalbumin action/deferral: Ordered   Urine microalbumin/cr due: 07/19/2011    Diabetes flowsheet reviewed?: Yes   Progress toward A1C goal: At goal    Stage of readiness to change (diabetes management): Action  Lipids   Total Cholesterol: 163  (04/05/2010)   LDL: 95  (04/05/2010)   LDL Direct: Not documented   HDL: 46  (04/05/2010)   Triglycerides: 108  (04/05/2010)   Lipid panel due: 04/06/2011    SGOT (AST): 17  (04/05/2010)   SGPT (ALT): 23  (04/05/2010)   Alkaline phosphatase: 66  (04/05/2010)   Total bilirubin: 0.7  (04/05/2010)   Liver panel due: 04/06/2011    Lipid flowsheet reviewed?: Yes   Progress toward LDL goal: Unchanged  Stage of readiness to change (lipid management): Action  Hypertension   Last Blood Pressure: 129 / 74  (07/18/2010)   Serum creatinine: 0.46  (04/05/2010)   Serum potassium 3.6  (04/05/2010)   Basic metabolic panel due: 04/06/2011    Hypertension flowsheet reviewed?: Yes   Progress toward BP goal: At goal    Stage of readiness to change (hypertension management):  Maintenance  Self-Management Support :   Personal Goals (by the next clinic visit) :     Personal A1C goal: 7  (04/05/2010)     Personal blood pressure goal: 130/80  (04/05/2010)     Personal LDL goal: 100  (04/05/2010)    Patient will work on the following items until the next clinic visit to reach self-care goals:     Medications and monitoring: take my medicines every day, bring all of my medications to every visit, examine my feet every day  (07/18/2010)     Eating: drink diet soda or water instead of juice or soda, eat more vegetables, use fresh or frozen vegetables, eat foods that are low in salt, eat baked foods instead of fried foods, eat fruit for snacks and desserts  (07/18/2010)     Activity: join a walking program  (04/05/2010)    Diabetes self-management support: Written self-care plan  (07/18/2010)   Diabetes care plan printed    Hypertension self-management support: Written self-care plan  (07/18/2010)   Hypertension self-care plan printed.    Lipid self-management support: Written self-care plan  (07/18/2010)   Lipid self-care plan printed.   Nursing Instructions: Give Flu vaccine today Give tetanus booster today Give Pneumovax today   Process Orders Check Orders Results:     [facility]: Order checked:     Deatra Robinson MD NOT AUTHORIZED TO ORDER Tests Sent for requisitioning (July 20, 2010 3:10 PM):     07/18/2010: [facility] -- T-Urine Microalbumin w/creat. ratio [[id number]] (signed)     Laboratory Results   Blood Tests   Date/Time Received: July 18, 2010 2:54 PM  Date/Time Reported: Burke Keels  July 18, 2010 2:54 PM   HGBA1C: 6.5%   (Normal Range: Non-Diabetic - 3-6%   Control Diabetic - 6-8%) CBG Random:: 94mg /dL

## 2010-09-26 NOTE — Progress Notes (Signed)
Summary: phone/gg  Phone Note Call from Patient   Caller: Patient Summary of Call: Pt called with c/o ? sty on eyelid. Will see tomorrow AM for evaluation.   She also stated she needs lab work. Please enter labs you want done. Initial call taken by: Merrie Roof RN,  April 04, 2010 11:55 AM  Follow-up for Phone Call        Provider Notified, Appt Scheduled Follow-up by: Deatra Robinson MD,  April 04, 2010 2:09 PM

## 2010-09-26 NOTE — Assessment & Plan Note (Signed)
Summary: NOT HFU-CALLED SEV TIMES/CFB   Vital Signs:  Patient profile:   60 year old female Height:      65 inches Weight:      199.9 pounds BMI:     33.39 Temp:     99.4 degrees F oral Pulse rate:   72 / minute BP sitting:   114 / 73  (right arm)  Vitals Entered By: Filomena Jungling NT II (November 23, 2009 2:39 PM) CC: NEED REFILLS, NEED SOMETHING ELSE FOR  PAIN DARVOCET TAKEN OFF THE MARKET,?KNEE INJECTION Is Patient Diabetic? Yes Did you bring your meter with you today? Yes Pain Assessment Patient in pain? yes     Location: knee Intensity: 7 Type: aching Onset of pain  Constant Nutritional Status BMI of > 30 = obese CBG Result 126  Does patient need assistance? Functional Status Self care Ambulation Normal   Primary Care Provider:  EAVWUJ  CC:  NEED REFILLS, NEED SOMETHING ELSE FOR  PAIN DARVOCET TAKEN OFF THE MARKET, and ?KNEE INJECTION.  History of Present Illness: F/u appointment for: 1. Left knee OA. Requests a "steroid injection." Last one on November 10th, 2011. 2. DM --refill on metformin. Checks her CBG's --run in 130's. Eye exam --scheduled for next Thursday. 3. Mammogram --scheduled; TAH in in 1984 2/2 DUB.   Depression History:      The patient denies a depressed mood most of the day and a diminished interest in her usual daily activities.         Preventive Screening-Counseling & Management  Alcohol-Tobacco     Alcohol drinks/day: 0     Smoking Status: never  Caffeine-Diet-Exercise     Does Patient Exercise: yes     Type of exercise: walking     Exercise (avg: min/session): <30     Times/week: 3  Problems Prior to Update: 1)  Preventive Health Care  (ICD-V70.0) 2)  Depressive Disorder  (ICD-311) 3)  Aftercare, Long-term Use, Medications Nec  (ICD-V58.69) 4)  Antihyperlipidemic Use, Long Term  (ICD-V58.69) 5)  Low Back Pain  (ICD-724.2) 6)  Hypertension  (ICD-401.9) 7)  Hyperlipidemia  (ICD-272.4) 8)  Gerd  (ICD-530.81) 9)  Dependent Edema,  Legs, Bilateral  (ICD-782.3) 10)  Osteoarthritis  (ICD-715.90) 11)  Knee Replacement, Hx of  (ICD-V43.65) 12)  Diabetes Mellitus, Type II  (ICD-250.00) 13)  Tubal Ligation, Hx of  (ICD-V26.51) 14)  Tonsillectomy, Hx of  (ICD-V45.79) 15)  Hysterectomy, Hx of  (ICD-V45.77)  Current Problems (verified): 1)  Preventive Health Care  (ICD-V70.0) 2)  Depressive Disorder  (ICD-311) 3)  Aftercare, Long-term Use, Medications Nec  (ICD-V58.69) 4)  Antihyperlipidemic Use, Long Term  (ICD-V58.69) 5)  Low Back Pain  (ICD-724.2) 6)  Hypertension  (ICD-401.9) 7)  Hyperlipidemia  (ICD-272.4) 8)  Gerd  (ICD-530.81) 9)  Dependent Edema, Legs, Bilateral  (ICD-782.3) 10)  Osteoarthritis  (ICD-715.90) 11)  Knee Replacement, Hx of  (ICD-V43.65) 12)  Diabetes Mellitus, Type II  (ICD-250.00) 13)  Tubal Ligation, Hx of  (ICD-V26.51) 14)  Tonsillectomy, Hx of  (ICD-V45.79) 15)  Hysterectomy, Hx of  (ICD-V45.77)  Medications Prior to Update: 1)  Metformin Hcl 1000 Mg Tabs (Metformin Hcl) .... Take 1 Tablet By Mouth Two Times A Day 2)  Lisinopril 20 Mg Tabs (Lisinopril) .... Take 1 Tablet By Mouth Once A Day 3)  Simvastatin 40 Mg Tabs (Simvastatin) .... Take 1 Tab By Mouth At Bedtime 4)  Lasix 40 Mg Tabs (Furosemide) .... Take 1 Tablet By Mouth Once A Day 5)  Prilosec Otc 20 Mg  Tbec (Omeprazole Magnesium) .... Take 1 Tablet By Mouth Once A Day As Needed 6)  Tylenol Arthritis Pain 650 Mg Cr-Tabs (Acetaminophen) .... Take 1 Tablet By Mouth Four Times A Day As Needed 7)  Aspirin 81 Mg Chew (Aspirin) .... Take 1 Tablet By Mouth Once A Day With Meals For Heart Health 8)  Tramadol Hcl 50 Mg Tabs (Tramadol Hcl) .... Four Times A Day Tab As Needed  For Pain  Current Medications (verified): 1)  Metformin Hcl 1000 Mg Tabs (Metformin Hcl) .... Take 1 Tablet By Mouth Two Times A Day 2)  Lisinopril 20 Mg Tabs (Lisinopril) .... Take 1 Tablet By Mouth Once A Day 3)  Simvastatin 40 Mg Tabs (Simvastatin) .... Take 1 Tab By  Mouth At Bedtime 4)  Lasix 40 Mg Tabs (Furosemide) .... Take 1 Tablet By Mouth Once A Day 5)  Prilosec Otc 20 Mg  Tbec (Omeprazole Magnesium) .... Take 1 Tablet By Mouth Once A Day As Needed 6)  Tylenol Arthritis Pain 650 Mg Cr-Tabs (Acetaminophen) .... Take 1 Tablet By Mouth Four Times A Day As Needed 7)  Aspirin 81 Mg Chew (Aspirin) .... Take 1 Tablet By Mouth Once A Day With Meals For Heart Health 8)  Fioricet 50-325-40 Mg Tabs (Butalbital-Apap-Caffeine) .... Take 1 Tablet By Mouth Four Times A Day As Needed For Pain  Allergies (verified): No Known Drug Allergies  Past History:  Family History: Last updated: 03/10/2009 Mother DM, HTN FAther Lung CA, CAD, HTN  Risk Factors: Alcohol Use: 0 (11/23/2009) Exercise: yes (11/23/2009)  Past medical, surgical, family and social histories (including risk factors) reviewed, and no changes noted (except as noted below).  Past Medical History: Reviewed history from 03/10/2009 and no changes required. Diabetes mellitus, type II dx0ed 2005 Osteoarthritis bilateral knees, s/p 3 total knee replacements on right (last 2005), Dr Shelle Iron GERD Hyperlipidemia Hypertension Low back pain Obesity Pellegrini-Stieda myositis ossificans  Past Surgical History: Reviewed history from 03/10/2009 and no changes required. Total knee replacement X 2 on right knee,  in 01/2002; and in 11/2005 Hysterectomy 1984 r/t DUB Tubal ligation Tonsillectomy  Family History: Reviewed history from 03/10/2009 and no changes required. Mother DM, HTN FAther Lung CA, CAD, HTN  Social History: Reviewed history from 03/10/2009 and no changes required. Married, 3 daughters. Works at a [employer] production pl[employer]: completed high school Nikotine: never ETOH: no Drugs: no  Review of Systems       per HPI   Physical Exam  General:  alert, well-nourished, well-hydrated, and overweight-appearing.  alert, well-nourished, and well-hydrated.   Head:  no  abnormalities observed.  no abnormalities observed.   Eyes:  PERRLA bialterally, no icterus bilaterally. Ears:  R ear normal and L ear normal.   Nose:  no external deformity and no nasal discharge.   Mouth:  good dentition, no gingival abnormalities, pharynx pink and moist, no erythema, and no exudates.   Neck:  supple, full ROM, and no masses.   Chest Wall:  no deformities.   Lungs:  normal respiratory effort and normal breath sounds.  no crackles and no wheezes.   Heart:  normal rate, regular rhythm, no murmur, and no JVD.   Abdomen:  Multiple old silvery striae; soft, non-tender, normal bowel sounds, no masses, and no hepatomegaly.   Msk:  Left knee valgus deformity is present; mild crepitus with flexionand extension; no redness over joints and no joint instability.   Pulses:  Pedal pulses 2+/4 bilaterally Extremities:  No edema or cyanosis bilaterally. Neurologic:  alert & oriented X3 Skin:  turgor normal and no rashes.   Cervical Nodes:  no anterior cervical adenopathy.   Psych:  Cognition and judgment appear intact. Alert and cooperative with normal attention span and concentration. No apparent delusions, illusions, hallucinations  Diabetes Management Exam:    Foot Exam (with socks and/or shoes not present):       Sensory-Pinprick/Light touch:          Left medial foot (L-4): normal          Left dorsal foot (L-5): normal          Left lateral foot (S-1): normal          Right medial foot (L-4): normal          Right dorsal foot (L-5): normal          Right lateral foot (S-1): normal       Sensory-Monofilament:          Left foot: normal          Right foot: normal       Inspection:          Left foot: normal          Right foot: normal       Nails:          Left foot: normal          Right foot: normal    Eye Exam:       Eye Exam done here today          Results: normal   Impression & Recommendations:  Problem # 1:  HYPERTENSION (ICD-401.9) controlled Her updated  medication list for this problem includes:    Lisinopril 20 Mg Tabs (Lisinopril) .Marland Kitchen... Take 1 tablet by mouth once a day    Lasix 40 Mg Tabs (Furosemide) .Marland Kitchen... Take 1 tablet by mouth once a day  BP today: 114/73 Prior BP: 117/72 (07/14/2009)  Labs Reviewed: K+: 4.2 (03/10/2009) Creat: : 0.50 (03/10/2009)   Chol: 129 (03/10/2009)   HDL: 45 (03/10/2009)   LDL: 62 (03/10/2009)   TG: 109 (03/10/2009)  Problem # 2:  OSTEOARTHRITIS (ICD-715.90) Intraarticluar injection with Kelog and lidocaine performed per patient's request. Concent form signed and all questions answerred in full prior to procedure. The following medications were removed from the medication list:    Tramadol Hcl 50 Mg Tabs (Tramadol hcl) .Marland Kitchen... Four times a day tab as needed  for pain Her updated medication list for this problem includes:    Tylenol Arthritis Pain 650 Mg Cr-tabs (Acetaminophen) .Marland Kitchen... Take 1 tablet by mouth four times a day as needed    Aspirin 81 Mg Chew (Aspirin) .Marland Kitchen... Take 1 tablet by mouth once a day with meals for heart health    Tylenol With Codeine #3 300-30 Mg Tabs (Acetaminophen-codeine) .Marland Kitchen... Take one tablet four times a day as needed  Orders: Joint Aspirate / Injection, Intermediate (16109)  Discussed use of medications, application of heat or cold, and exercises.   Problem # 3:  DIABETES MELLITUS, TYPE II (ICD-250.00) Weight management;diet, exercise; hypoglycemia, foot care reviewed with the patient. Her updated medication list for this problem includes:    Metformin Hcl 1000 Mg Tabs (Metformin hcl) .Marland Kitchen... Take 1 tablet by mouth two times a day    Lisinopril 20 Mg Tabs (Lisinopril) .Marland Kitchen... Take 1 tablet by mouth once a day    Aspirin 81 Mg Chew (Aspirin) .Marland Kitchen... Take 1  tablet by mouth once a day with meals for heart health  Orders: T- Capillary Blood Glucose (57846) T-Hgb A1C (in-house) (96295MW)  Labs Reviewed: Creat: 0.50 (03/10/2009)     Last Eye Exam: normal (11/23/2009) Reviewed HgBA1c  results: 6.9 (11/23/2009)  7.1 (07/14/2009)  Problem # 4:  PREVENTIVE HEALTH CARE (ICD-V70.0) Scheduled for pap; mammogram; UTD on vaccination.  Complete Medication List: 1)  Metformin Hcl 1000 Mg Tabs (Metformin hcl) .... Take 1 tablet by mouth two times a day 2)  Lisinopril 20 Mg Tabs (Lisinopril) .... Take 1 tablet by mouth once a day 3)  Simvastatin 40 Mg Tabs (Simvastatin) .... Take 1 tab by mouth at bedtime 4)  Lasix 40 Mg Tabs (Furosemide) .... Take 1 tablet by mouth once a day 5)  Prilosec Otc 20 Mg Tbec (Omeprazole magnesium) .... Take 1 tablet by mouth once a day as needed 6)  Tylenol Arthritis Pain 650 Mg Cr-tabs (Acetaminophen) .... Take 1 tablet by mouth four times a day as needed 7)  Aspirin 81 Mg Chew (Aspirin) .... Take 1 tablet by mouth once a day with meals for heart health 8)  Tylenol With Codeine #3 300-30 Mg Tabs (Acetaminophen-codeine) .... Take one tablet four times a day as needed Prescriptions: ASPIRIN 81 MG CHEW (ASPIRIN) Take 1 tablet by mouth once a day with meals for heart health  #30 x 11   Entered and Authorized by:   Deatra Robinson MD   Signed by:   Deatra Robinson MD on 11/23/2009   Method used:   Print then Give to Patient   RxID:   [id number] METFORMIN HCL 1000 MG TABS (METFORMIN HCL) Take 1 tablet by mouth two times a day  #180 Each x 3   Entered and Authorized by:   Deatra Robinson MD   Signed by:   Deatra Robinson MD on 11/23/2009   Method used:   Print then Give to Patient   RxID:   [id number] LISINOPRIL 20 MG TABS (LISINOPRIL) Take 1 tablet by mouth once a day  #90 x 3   Entered and Authorized by:   Deatra Robinson MD   Signed by:   Deatra Robinson MD on 11/23/2009   Method used:   Print then Give to Patient   RxID:   [id number] SIMVASTATIN 40 MG TABS (SIMVASTATIN) Take 1 tab by mouth at bedtime  #90 x 3   Entered and Authorized by:   Deatra Robinson MD   Signed by:   Deatra Robinson MD on 11/23/2009   Method used:   Print  then Give to Patient   RxID:   [id number] TYLENOL WITH CODEINE #3 300-30 MG TABS (ACETAMINOPHEN-CODEINE) Take one tablet four times a day as needed  #60 x 2   Entered and Authorized by:   Deatra Robinson MD   Signed by:   Deatra Robinson MD on 11/23/2009   Method used:   Print then Give to Patient   RxID:   [id number]   Prevention & Chronic Care Immunizations   Influenza vaccine: Fluvax 3+  (07/14/2009)    Tetanus booster: Not documented   Tetanus booster due: 03/11/2019    Pneumococcal vaccine: Not documented  Colorectal Screening   Hemoccult: Not documented    Colonoscopy: Not documented  Other Screening   Pap smear: Not documented    Mammogram: Not documented   Smoking status: never  (11/23/2009)  Diabetes Mellitus   HgbA1C: 6.9  (11/23/2009)   Hemoglobin A1C due: 02/21/2008    Eye exam: normal  (  11/23/2009)   Eye exam due: 11/2010    Foot exam: yes  (11/23/2009)   Foot exam action/deferral: Do today   High risk foot: No  (03/10/2009)   Foot care education: Done  (03/10/2009)   Foot exam due: 03/10/2010    Urine microalbumin/creatinine ratio: 28.0  (03/10/2009)  Lipids   Total Cholesterol: 129  (03/10/2009)   LDL: 62  (03/10/2009)   LDL Direct: Not documented   HDL: 45  (03/10/2009)   Triglycerides: 109  (03/10/2009)   Lipid panel due: 06/21/2007    SGOT (AST): 17  (03/10/2009)   SGPT (ALT): 26  (03/10/2009)   Alkaline phosphatase: 63  (03/10/2009)   Total bilirubin: 0.5  (03/10/2009)  Hypertension   Last Blood Pressure: 114 / 73  (11/23/2009)   Serum creatinine: 0.50  (03/10/2009)   Serum potassium 4.2  (03/10/2009)  Self-Management Support :    Patient will work on the following items until the next clinic visit to reach self-care goals:     Medications and monitoring: take my medicines every day, check my blood sugar  (11/23/2009)     Eating: drink diet soda or water instead of juice or soda, use fresh or frozen vegetables, eat  foods that are low in salt, eat baked foods instead of fried foods, eat fruit for snacks and desserts  (11/23/2009)     Activity: take a 30 minute walk every day  (11/23/2009)    Diabetes self-management support: Education handout, Resources for patients handout  (11/23/2009)   Diabetes education handout printed    Hypertension self-management support: Education handout, Resources for patients handout  (11/23/2009)   Hypertension education handout printed    Lipid self-management support: Education handout, Resources for patients handout  (11/23/2009)     Lipid education handout printed      Resource handout printed.     Laboratory Results   Blood Tests   Date/Time Received: November 23, 2009 2:58 PM  Date/Time Reported: Burke Keels  November 23, 2009 2:58 PM   HGBA1C: 6.9%   (Normal Range: Non-Diabetic - 3-6%   Control Diabetic - 6-8%) CBG Random:: 126mg /dL

## 2010-09-26 NOTE — Assessment & Plan Note (Signed)
Summary: ? sty on eyelid/gg   Vital Signs:  Patient profile:   60 year old female Height:      65 inches (165.10 cm) Weight:      192.5 pounds (87.50 kg) BMI:     32.15 Temp:     97.0 degrees F Pulse rate:   64 / minute BP sitting:   111 / 67  (left arm) Cuff size:   large  Vitals Entered By: Dorie Rank RN (April 05, 2010 8:52 AM)  Nutrition Counseling: Patient's BMI is greater than 25 and therefore counseled on weight management options. CC: stye OS eyelid started Sunday night - used some ice to decrease swelling - almost gone - denies dizziness now, Depression Is Patient Diabetic? Yes Did you bring your meter with you today? No Pain Assessment Patient in pain? no      Nutritional Status BMI of > 30 = obese  Does patient need assistance? Functional Status Self care Ambulation Normal   Primary Care Provider:  [place]  CC:  stye OS eyelid started Sunday night - used some ice to decrease swelling - almost gone - denies dizziness now and Depression.  History of Present Illness: 1. left eyelid "bump" for 3 days; itchy; no visual changes; pain or discharge. 2. Vaginal discharge.  Depression History:      The patient denies a depressed mood most of the day and a diminished interest in her usual daily activities.        Comments:  "just deal with my brother and mother - that's life".   Preventive Screening-Counseling & Management  Alcohol-Tobacco     Alcohol drinks/day: 0     Smoking Status: never  Caffeine-Diet-Exercise     Does Patient Exercise: yes     Type of exercise: walking     Exercise (avg: min/session): <30     Times/week: 3  Medications Prior to Update: 1)  Metformin Hcl 1000 Mg Tabs (Metformin Hcl) .... Take 1 Tablet By Mouth Two Times A Day 2)  Lisinopril 20 Mg Tabs (Lisinopril) .... Take 1 Tablet By Mouth Once A Day 3)  Simvastatin 40 Mg Tabs (Simvastatin) .... Take 1 Tab By Mouth At Bedtime 4)  Lasix 40 Mg Tabs (Furosemide) .... Take 1 Tablet By  Mouth Once A Day 5)  Prilosec Otc 20 Mg  Tbec (Omeprazole Magnesium) .... Take 1 Tablet By Mouth Once A Day As Needed 6)  Tylenol Arthritis Pain 650 Mg Cr-Tabs (Acetaminophen) .... Take 1 Tablet By Mouth Four Times A Day As Needed 7)  Aspirin 81 Mg Chew (Aspirin) .... Take 1 Tablet By Mouth Once A Day With Meals For Heart Health 8)  Tylenol With Codeine #3 300-30 Mg Tabs (Acetaminophen-Codeine) .... Take One Tablet Four Times A Day As Needed  Allergies (verified): No Known Drug Allergies  Past History:  Risk Factors: Smoking Status: never (04/05/2010)  Past medical history reviewed for relevance to current acute and chronic problems.  Past Medical History: Reviewed history from 03/10/2009 and no changes required. Diabetes mellitus, type II dx0ed 2005 Osteoarthritis bilateral knees, s/p 3 total knee replacements on right (last 2005), Dr Shelle Iron GERD Hyperlipidemia Hypertension Low back pain Obesity Pellegrini-Stieda myositis ossificans  Review of Systems       per HPI  Physical Exam  General:  Well-developed,well-nourished,in no acute distress; alert,appropriate and cooperative throughout examination Head:  no abnormalities observed.  no abnormalities observed.   Eyes:  vision grossly intact, pupils equal, pupils round, pupils reactive to light, and  chalazion to OS.   Ears:  R ear normal and L ear normal.   Nose:  no external deformity and no nasal discharge.   Mouth:  good dentition, no gingival abnormalities, pharynx pink and moist, no erythema, and no exudates.   Neck:  supple, full ROM, and no masses.   Lungs:  Normal respiratory effort, chest expands symmetrically. Lungs are clear to auscultation, no crackles or wheezes. Heart:  Normal rate and regular rhythm. S1 and S2 normal without gallop, murmur, click, rub or other extra sounds. Abdomen:  soft, non-tender, and normal bowel sounds.   Msk:  deferred Pulses:  eferred Skin:  Intact without suspicious lesions or  rashes Psych:  Cognition and judgment appear intact. Alert and cooperative with normal attention span and concentration. No apparent delusions, illusions, hallucinations  Diabetes Management Exam:    Foot Exam (with socks and/or shoes not present):       Sensory-Pinprick/Light touch:          Left medial foot (L-4): normal          Left dorsal foot (L-5): normal          Left lateral foot (S-1): normal          Right medial foot (L-4): normal          Right dorsal foot (L-5): normal          Right lateral foot (S-1): normal       Sensory-Monofilament:          Left foot: normal          Right foot: normal       Inspection:          Left foot: normal          Right foot: normal       Nails:          Left foot: normal          Right foot: normal    Foot Exam by Podiatrist:       Date: 04/05/2010       Results: no diabetic findings       Done by: Denton Meek   Impression & Recommendations:  Problem # 1:  HORDEOLUM, INTERNAL (ICD-373.12) Hygiene discussed. Warm compresses to OS. If not resolving or getting worse --may consider tol treat with augmentum for 7 days. Avoid exposure to cold temperatures (such sitting under air conditioning). follow up in 10 days if no improvement or worse. Given a Rx for Fluconazole given predisposition for moniliosis due to Abx use Called to the patient after she left to confrim that the patient understood instructions. Patient verbalized understanding of the plan of treatment.  Medications Added to Medication List This Visit: 1)  Amoxicillin-pot Clavulanate 875-125 Mg Tabs (Amoxicillin-pot clavulanate) .... Take 1 tablet by mouth two times a day with meals 2)  Fluconazole 150 Mg Tabs (Fluconazole) .... Take one tablet once after completing your treatment with antibiotics.  Complete Medication List: 1)  Metformin Hcl 1000 Mg Tabs (Metformin hcl) .... Take 1 tablet by mouth two times a day 2)  Lisinopril 20 Mg Tabs (Lisinopril) .... Take 1 tablet by  mouth once a day 3)  Simvastatin 40 Mg Tabs (Simvastatin) .... Take 1 tab by mouth at bedtime 4)  Lasix 40 Mg Tabs (Furosemide) .... Take 1 tablet by mouth once a day 5)  Prilosec Otc 20 Mg Tbec (Omeprazole magnesium) .... Take 1 tablet by mouth once a day as needed 6)  Tylenol Arthritis Pain 650 Mg Cr-tabs (Acetaminophen) .... Take 1 tablet by mouth four times a day as needed 7)  Aspirin 81 Mg Chew (Aspirin) .... Take 1 tablet by mouth once a day with meals for heart health 8)  Tylenol With Codeine #3 300-30 Mg Tabs (Acetaminophen-codeine) .... Take one tablet four times a day as needed 9)  Amoxicillin-pot Clavulanate 875-125 Mg Tabs (Amoxicillin-pot clavulanate) .... Take 1 tablet by mouth two times a day with meals 10)  Fluconazole 150 Mg Tabs (Fluconazole) .... Take one tablet once after completing your treatment with antibiotics.  Other Orders: T-Urine Microalbumin w/creat. ratio [id number])  Patient Instructions: 1)  Please, take medications as prescribed. 2)  Call with any questions. 3)  Follow up in 3-4 months or sooner. Prescriptions: FLUCONAZOLE 150 MG TABS (FLUCONAZOLE) Take one tablet once after completing your treatment with antibiotics.  #1 x 3   Entered and Authorized by:   Deatra Robinson MD   Signed by:   Deatra Robinson MD on 04/05/2010   Method used:   Electronically to        [facility] [phone]* (retail)       [address]       [place], [place]  [address]       Ph: [phone]       Fax: [phone]   RxID:   [id number] AMOXICILLIN-POT CLAVULANATE 875-125 MG TABS (AMOXICILLIN-POT CLAVULANATE) Take 1 tablet by mouth two times a day with meals  #14 x 0   Entered and Authorized by:   Deatra Robinson MD   Signed by:   Deatra Robinson MD on 04/05/2010   Method used:   Electronically to        [facility] [phone]* (retail)       [address]       [place], [place]  [address]       Ph: [phone]       Fax: [phone]   RxID:    [id number]  Process Orders Check Orders Results:     [facility]: ABN not required for this insurance Tests Sent for requisitioning (April 05, 2010 9:30 AM):     04/05/2010: [facility] -- T-Urine Microalbumin w/creat. ratio [[id number]] (signed)     Prevention & Chronic Care Immunizations   Influenza vaccine: Fluvax 3+  (07/14/2009)   Influenza vaccine deferral: Deferred  (03/28/2010)   Influenza vaccine due: 04/27/2010    Tetanus booster: Not documented   Td booster deferral: Deferred  (03/28/2010)   Tetanus booster due: 03/11/2019    Pneumococcal vaccine: Not documented   Pneumococcal vaccine due: 03/13/2015    H. zoster vaccine: Not documented   H. zoster vaccine deferral: Not available  (03/28/2010)  Colorectal Screening   Hemoccult: Not documented    Colonoscopy: Not documented   Colonoscopy action/deferral: GI referral  (03/28/2010)  Other Screening   Pap smear: Not documented   Pap smear action/deferral: Not indicated S/P hysterectomy  (03/28/2010)    Mammogram: Not documented   Mammogram action/deferral: Ordered  (03/28/2010)    DXA bone density scan: Not documented   Smoking status: never  (04/05/2010)  Diabetes Mellitus   HgbA1C: 7.0  (03/28/2010)   Hemoglobin A1C due: 02/21/2008    Eye exam: normal  (11/23/2009)   Eye exam due: 11/2010    Foot exam: yes  (04/05/2010)   Foot exam action/deferral: Do today   High risk foot: No  (03/28/2010)   Foot care education: Done  (03/10/2009)  Foot exam due: 03/29/2011    Urine microalbumin/creatinine ratio: 28.0  (03/10/2009)   Urine microalbumin action/deferral: Ordered    Diabetes flowsheet reviewed?: Yes   Progress toward A1C goal: Unchanged  Lipids   Total Cholesterol: 129  (03/10/2009)   LDL: 62  (03/10/2009)   LDL Direct: Not documented   HDL: 45  (03/10/2009)   Triglycerides: 109  (03/10/2009)   Lipid panel due: 04/06/2011    SGOT (AST): 17   (03/10/2009)   SGPT (ALT): 26  (03/10/2009)   Alkaline phosphatase: 63  (03/10/2009)   Total bilirubin: 0.5  (03/10/2009)   Liver panel due: 04/06/2011    Lipid flowsheet reviewed?: Yes   Progress toward LDL goal: Unchanged  Hypertension   Last Blood Pressure: 111 / 67  (04/05/2010)   Serum creatinine: 0.50  (03/10/2009)   Serum potassium 4.2  (03/10/2009)   Basic metabolic panel due: 04/06/2011    Hypertension flowsheet reviewed?: Yes   Progress toward BP goal: Unchanged  Self-Management Support :   Personal Goals (by the next clinic visit) :     Personal A1C goal: 7  (04/05/2010)     Personal blood pressure goal: 130/80  (04/05/2010)     Personal LDL goal: 100  (04/05/2010)    Patient will work on the following items until the next clinic visit to reach self-care goals:     Medications and monitoring: take my medicines every day, check my blood sugar, bring all of my medications to every visit, examine my feet every day  (04/05/2010)     Eating: drink diet soda or water instead of juice or soda, eat more vegetables, eat baked foods instead of fried foods, eat fruit for snacks and desserts  (04/05/2010)     Activity: join a walking program  (04/05/2010)    Diabetes self-management support: Resources for patients handout, Written self-care plan  (04/05/2010)   Diabetes care plan printed    Hypertension self-management support: Resources for patients handout, Written self-care plan  (04/05/2010)   Hypertension self-care plan printed.    Lipid self-management support: Resources for patients handout, Written self-care plan  (04/05/2010)   Lipid self-care plan printed.      Resource handout printed.   Process Orders Check Orders Results:     [facility]: ABN not required for this insurance Tests Sent for requisitioning (April 05, 2010 9:30 AM):     04/05/2010: [facility] -- T-Urine Microalbumin w/creat. ratio [[id number]] (signed)

## 2010-09-26 NOTE — Miscellaneous (Signed)
Summary: MED/SURGICAL/DIAGNOSTIC CONSENT  MED/SURGICAL/DIAGNOSTIC CONSENT   Imported By: Shon Hough 03/31/2010 13:54:59  _____________________________________________________________________  External Attachment:    Type:   Image     Comment:   External Document

## 2010-09-26 NOTE — Miscellaneous (Signed)
Summary: CONSENT FOR MEDICAL /SURGICAL   CONSENT FOR MEDICAL /SURGICAL   Imported By: Margie Billet 07/24/2010 10:48:01  _____________________________________________________________________  External Attachment:    Type:   Image     Comment:   External Document

## 2010-09-28 NOTE — Assessment & Plan Note (Signed)
Summary: NP,L KNEE PAIN,MC   Vital Signs:  Patient profile:   60 year old female BP sitting:   145 / 87  Vitals Entered By: Lillia Pauls CMA (August 07, 2010 2:01 PM)  Primary Care Ostin Mathey:  [place]   History of Present Illness: LEFT knee pain--worsening over last few months but present several years. Pain with walking, climbing stairs. Right in joint. Some stiffness. No swelling or erythema.  Was seen at [facility] [facility] and had a steroid injectin 07/17/2010. It helped for a couple of days only. has had x ray in past and she reports it was 'bone on bone'.  PERTINENT PMH/PSH: RIGHt knee TKR with multiple revisions  Allergies: No Known Drug Allergies  Past History:  Past Medical History: Last updated: 03/10/2009 Diabetes mellitus, type II dx0ed 2005 Osteoarthritis bilateral knees, s/p 3 total knee replacements on right (last 2005), Dr Shelle Iron GERD Hyperlipidemia Hypertension Low back pain Obesity Pellegrini-Stieda myositis ossificans  Past Surgical History: Last updated: 03/10/2009 Total knee replacement X 2 on right knee,  in 01/2002; and in 11/2005 Hysterectomy 1984 r/t DUB Tubal ligation Tonsillectomy  Review of Systems  The patient denies fever, weight loss, and weight gain.    Physical Exam  General:  alert, well-developed, well-nourished, and well-hydrated.   Msk:  RIGHT knee: well healed scar midline vertical knee. lacks full extension by 5 degrees, lacks full flexion by 30 degrees. no effusion. Nontender to palpation. Ligamentously intact.  LEFT KNEE: full extension and flexion. Ligamentously intact. TTP medial joint line. No effusion  Claves B soft   Impression & Recommendations:  Problem # 1:  OSTEOARTHRITIS (ICD-715.90) given her signs and report taht she previously was bone on bone x ray a few years ago, only option I would have woul dbe to try a styeroid injection, however she had shot < 1 m ago. So i woud not repeat or do another trial. Will return her  ot care of her PCp--next step woul dbe pain control and referral to ortho--she is not yet ready to consider TKR on this side. > 50% of 45 minute ov spent in education and counseling re DJD knee andoptions.  Complete Medication List: 1)  Metformin Hcl 1000 Mg Tabs (Metformin hcl) .... Take 1 tablet by mouth two times a day 2)  Lisinopril 20 Mg Tabs (Lisinopril) .... Take 1 tablet by mouth once a day 3)  Simvastatin 40 Mg Tabs (Simvastatin) .... Take 1 tab by mouth at bedtime 4)  Lasix 40 Mg Tabs (Furosemide) .... Take 1 tablet by mouth once a day 5)  Prilosec Otc 20 Mg Tbec (Omeprazole magnesium) .... Take 1 tablet by mouth once a day as needed 6)  Aspirin 81 Mg Chew (Aspirin) .... Take 1 tablet by mouth once a day with meals for heart health 7)  Tylenol With Codeine #3 300-30 Mg Tabs (Acetaminophen-codeine) .... Take one tablet four times a day as needed   Orders Added: 1)  Est. Patient Level IV [[address]]

## 2010-10-18 ENCOUNTER — Telehealth: Payer: Self-pay | Admitting: *Deleted

## 2010-10-18 NOTE — Telephone Encounter (Signed)
Pt called stating she has been breaking out in bumps over entire body.  It started before Christmas after eating pecans.  She has not had nuts since that time.   Denies new detergents.  No new foods and no new meds. Last bump yesterday, they pop up, she uses hydrocortisone cream and they resolve.  She takes benadryl for itching. Please advise Pt # [id number]

## 2010-10-18 NOTE — Telephone Encounter (Signed)
It would be good to see this rash while it is happening. If she can be seen here or at urgent care with an active rash, please arrange this. I cannot suggest anything else based on the data in your note.

## 2010-10-18 NOTE — Telephone Encounter (Signed)
Pt has been informed and will call in AM for appointment.

## 2010-11-19 LAB — GLUCOSE, CAPILLARY: Glucose-Capillary: 126 mg/dL — ABNORMAL HIGH (ref 70–99)

## 2010-11-29 ENCOUNTER — Encounter: Payer: Self-pay | Admitting: Internal Medicine

## 2010-11-29 ENCOUNTER — Telehealth: Payer: Self-pay | Admitting: *Deleted

## 2010-11-29 ENCOUNTER — Ambulatory Visit (INDEPENDENT_AMBULATORY_CARE_PROVIDER_SITE_OTHER): Payer: Self-pay | Admitting: Internal Medicine

## 2010-11-29 ENCOUNTER — Inpatient Hospital Stay (HOSPITAL_COMMUNITY)
Admission: AD | Admit: 2010-11-29 | Discharge: 2010-12-01 | DRG: 639 | Disposition: A | Discharge status: Home / Self Care | Payer: Self-pay | Source: Other Acute Inpatient Hospital | Attending: Internal Medicine | Admitting: Internal Medicine

## 2010-11-29 VITALS — BP 90/70 | HR 80 | Temp 99.0°F | Wt 175.9 lb

## 2010-11-29 DIAGNOSIS — N289 Disorder of kidney and ureter, unspecified: Secondary | ICD-10-CM | POA: Diagnosis present

## 2010-11-29 DIAGNOSIS — Z7982 Long term (current) use of aspirin: Secondary | ICD-10-CM

## 2010-11-29 DIAGNOSIS — E785 Hyperlipidemia, unspecified: Secondary | ICD-10-CM | POA: Diagnosis present

## 2010-11-29 DIAGNOSIS — I10 Essential (primary) hypertension: Secondary | ICD-10-CM | POA: Diagnosis present

## 2010-11-29 DIAGNOSIS — E119 Type 2 diabetes mellitus without complications: Secondary | ICD-10-CM

## 2010-11-29 DIAGNOSIS — F3289 Other specified depressive episodes: Secondary | ICD-10-CM | POA: Diagnosis present

## 2010-11-29 DIAGNOSIS — M1710 Unilateral primary osteoarthritis, unspecified knee: Secondary | ICD-10-CM | POA: Diagnosis present

## 2010-11-29 DIAGNOSIS — K219 Gastro-esophageal reflux disease without esophagitis: Secondary | ICD-10-CM

## 2010-11-29 DIAGNOSIS — F329 Major depressive disorder, single episode, unspecified: Secondary | ICD-10-CM | POA: Diagnosis present

## 2010-11-29 DIAGNOSIS — Z794 Long term (current) use of insulin: Secondary | ICD-10-CM

## 2010-11-29 DIAGNOSIS — E1101 Type 2 diabetes mellitus with hyperosmolarity with coma: Principal | ICD-10-CM | POA: Diagnosis present

## 2010-11-29 LAB — URINE MICROSCOPIC-ADD ON

## 2010-11-29 LAB — COMPREHENSIVE METABOLIC PANEL
ALT: 17 U/L (ref 0–35)
AST: 10 U/L (ref 0–37)
Calcium: 9.6 mg/dL (ref 8.4–10.5)
Creatinine, Ser: 0.86 mg/dL (ref 0.4–1.2)
GFR calc Af Amer: >60 mL/min (ref >60)
Sodium: 144 mEq/L (ref 135–145)
Total Protein: 7.1 g/dL (ref 6.0–8.3)

## 2010-11-29 LAB — URINALYSIS, MICROSCOPIC ONLY
Casts: NONE SEEN
Crystals: NONE SEEN
RBC / HPF: NONE SEEN RBC/hpf (ref <3)

## 2010-11-29 LAB — GLUCOSE, CAPILLARY
Glucose-Capillary: 97 mg/dL (ref 70–99)
Glucose-Capillary: >600 mg/dL (ref 70–99)
Glucose-Capillary: 284 mg/dL — ABNORMAL HIGH (ref 70–99)

## 2010-11-29 LAB — BASIC METABOLIC PANEL WITH GFR
Chloride: 98 mEq/L (ref 96–112)
Creat: 1.25 mg/dL — ABNORMAL HIGH (ref 0.40–1.20)
GFR, Est Non African American: 44 mL/min — ABNORMAL LOW (ref >60)
Potassium: 5.1 mEq/L (ref 3.5–5.3)

## 2010-11-29 LAB — URINALYSIS, ROUTINE W REFLEX MICROSCOPIC
Bilirubin Urine: NEGATIVE
Bilirubin Urine: NEGATIVE
Glucose, UA: >1000 mg/dL — AB
Hgb urine dipstick: NEGATIVE
Ketones, ur: 15 mg/dL — AB
Nitrite: NEGATIVE
Specific Gravity, Urine: 1.03 (ref 1.005–1.030)
Specific Gravity, Urine: 1.025 (ref 1.005–1.030)
Urobilinogen, UA: 1 mg/dL (ref 0.0–1.0)
pH: 5 (ref 5.0–8.0)

## 2010-11-29 LAB — CBC
MCHC: 34.7 g/dL (ref 30.0–36.0)
Platelets: 208 10*3/uL (ref 150–400)
RDW: 13.5 % (ref 11.5–15.5)

## 2010-11-29 LAB — DIFFERENTIAL
Basophils Absolute: 0.1 10*3/uL (ref 0.0–0.1)
Basophils Relative: 1 % (ref 0–1)
Eosinophils Absolute: 0 10*3/uL (ref 0.0–0.7)
Eosinophils Relative: 0 % (ref 0–5)
Monocytes Absolute: 0.8 10*3/uL (ref 0.1–1.0)
Neutro Abs: 7.3 10*3/uL (ref 1.7–7.7)

## 2010-11-29 LAB — TSH: TSH: 0.325 u[IU]/mL — ABNORMAL LOW (ref 0.350–4.500)

## 2010-11-29 LAB — HEMOGLOBIN A1C: Hgb A1c MFr Bld: 11.7 % — ABNORMAL HIGH (ref <5.7)

## 2010-11-29 MED ORDER — INSULIN ASPART 100 UNIT/ML ~~LOC~~ SOLN: 10.0000 [IU] | Freq: Once | SUBCUTANEOUS | Status: DC

## 2010-11-29 NOTE — Progress Notes (Signed)
IV started in rt hand with # 22 angiocath.   Hung 1 liter NS wide open per Dr order. Earney Hamburg RN

## 2010-11-29 NOTE — Telephone Encounter (Signed)
Pt has been not feeling well for 2 weeks. C/o   sinus infection and eye infection. She was seen today by eye MD and treated. She was told to come home and check CBG.   Checked cbg X 2  And meter  read high. Pt takes Metformin 1000 mg bid and has been taking that. Pt called in at 1130, I returned call at 1300 and told pt to come in. Appointment scheduled.

## 2010-11-29 NOTE — H&P (Signed)
Hospital Admission Note Date: 11/29/2010  Patient name: Renee Watts Medical record number: [id number] Date of birth: 1951/02/05 Age: 60 y.o. Gender: female PCP: Renee Robinson, NP  Medical Service: B-2 Medicine Teaching Service  Attending physician:  Renee Watts     Resident (R2/R3): Renee Watts   Pager: [phone] Resident (R1): Renee Watts   Pager: [phone]  Chief Complaint: "I am not feeling well."  History of Present Illness: Ms. Touch is a 60 y/o pleasant woman with a past medical history as delineated below, presents to Renee Watts at [facility] with a complaint of "not feeling well for 2 weeks." Patient reports having a sensation of cotton in her throat with increased thirst and urination. Patient states that she would drink lemonade "non-stop," 2 cartons of fruit punch, Dell fruit cups within 2 days prior to admission. She reports felling "warm," but denies any fever, chills, diaphoresis, HA, dizziness, cough, sore throat, neck pain, CP, SOB, palpitations, abdominal pain, constipation, diarrhea, dysuria, hematuria, leg swelling or rash. Patient reports one episode of blurred vision while attending Sunday mass at church on November 27, 2010. She also reports watery eyes with redness and was evaluated by an ophthalmologist "who gave her some eye drops" that relieved her symptoms. Patient denies any change in her medication regimen; compliance with all her medications, and denies any use of steroids, exposure to sick persons or recent travel. A  B-met was drawn at the clinic with blood glucose level of 718. Off note, the patient is type 2 diabetic with the last HgbA1C of 6.1 as of November, 2011.  Current Outpatient Prescriptions  Medication Sig Dispense Refill  . acetaminophen-codeine (TYLENOL #3) 300-30 MG per tablet Take 1 tablet by mouth every 6 (six) hours as needed.        Marland Kitchen aspirin 81 MG tablet Take 81 mg by mouth daily.        . furosemide (LASIX) 40 MG tablet Take 40 mg by mouth daily.        Marland Kitchen  lisinopril (PRINIVIL,ZESTRIL) 20 MG tablet Take 20 mg by mouth daily.        . metFORMIN (GLUCOPHAGE) 1000 MG tablet Take 1,000 mg by mouth 2 (two) times daily with a meal.        . omeprazole (PRILOSEC) 20 MG capsule Take 20 mg by mouth daily as needed.        . simvastatin (ZOCOR) 40 MG tablet Take 40 mg by mouth at bedtime.          Allergies: Review of patient's allergies indicates no known allergies.  Past Medical History  Diagnosis Date  . Hyperlipidemia   . Hypertension   . GERD (gastroesophageal reflux disease)   . Depression   . Arthritis   . Low back pain     Past Surgical History  Procedure Date  . Tubal ligation   . Abdominal hysterectomy   . Tonsillectomy     No family history on file.  History   Social History  . Marital Status: Married    Spouse Name: N/A    Number of Children: N/A  . Years of Education: N/A   Occupational History  . Not on file.   Social History Main Topics  . Smoking status: Not on file  . Smokeless tobacco: Not on file  . Alcohol Use: Not on file  . Drug Use: Not on file  . Sexually Active: Not on file   Other Topics Concern  . Not on file   Social History Narrative  .  No narrative on file    Review of Systems: Pertinent items are noted in HPI.  Physical Exam: VS: BP(supine): 100/70; HR 84;  (standing): 90/70 HR 80;  Temp 99.0; RR 17; Sats 97% on RA. General: Vital signs reviewed and noted. Well-developed,well-nourished but  looks dry,in no acute distress; alert,appropriate and cooperative throughout examination. Head: normocephalic, atraumatic. Eyes: EOMI and PERRL bilaterally; no conjunctival pallor or scleral icterus bilaterally. Neck: No deformities, masses, or tenderness noted. No JVD's bilaterally. Lungs: Normal respiratory effort. Clear to auscultation BL without crackles or wheezes.  Heart: RRR. S1 and S2 normal without gallop, murmur, or rubs.  Abdomen: BS normoactive. Soft, Nondistended, non-tender.  No masses  or organomegaly. Extremities: No pretibial edema. Right knee joint deformity with an overlying old and well-healed scar noted (chronic and r/t to hx of DJD). No joint erythema, effusion or tenderness bilaterally. Skin: poor turgor; no rashes. Neuro: Non-focal. Psych: memory intact to recent or remote; non-anxious or non-depressed affect.  Lab results: Na 137 K 5.1 chrolirde 98 CO2 25 Anion Gap 14 Glucose 718 BUN 24 Cr 1.25 Calcium 9.8 GFR >60   Imaging results: none  Other results: none  Assessment & Plan by Problem: 1. Patient presents with Nausea, Emesis, Orthostatic Hypotension and  Hyperglycemia with BG of 718 in a setting of DM, type 2. Unsure of an underlying cause. List of differential diagnoses include a severe pancreatic insufficiency due to a "burned out" pancrease vs pancreatitis vs gallbladder disease vs viral syndrome . Patient does not meet criteria for [facility] syndrome; Patient denies use of glucocorticoids, thiazides, OTC niacin  --therefore, worsened drug-induced hyperglycemia is unlikely. >Plan is to admit the patient and provide full supportive care with insulin SSI, IVF, Zofran; follow CBG's, electrolytes and renal function.  >Will hold Lasix and Lisinopril due to hypotension. >C-met, Lipase, TSH, HGBA1c, CBC, UA+micro and C&S. 2. Right Knee OA ->continue with Tylenol #3 on a PRN basis. 3. DVT prophylaxis: SCD's.

## 2010-11-29 NOTE — Assessment & Plan Note (Addendum)
Lab Results  Component Value Date   NA 142 04/05/2010   K 3.6 04/05/2010   CL 106 04/05/2010   CO2 26 04/05/2010   BUN 12 04/05/2010   CREATININE 0.46 04/05/2010    BP Readings from Last 3 Encounters:  11/29/10 90/70  08/07/10 145/87  07/18/10 129/74   Her BP soft, orthostatic vitals negative. Will hold her HTN meds and admit to hospital and rehydration during the hospitalization and monitor BP.

## 2010-11-29 NOTE — Assessment & Plan Note (Addendum)
Lab Results  Component Value Date   HGBA1C 11.6 11/29/2010   HGBA1C 6.5 07/18/2010   CREATININE 0.46 04/05/2010   MICROALBUR 3.59* 07/18/2010   MICRALBCREAT 60.8* 07/18/2010   CHOL 163 04/05/2010   HDL 46 04/05/2010   TRIG 108 04/05/2010    Last eye exam and foot exam: No results found for this basename: HMDIABEYEEXA, HMDIABFOOTEX   Her glucose 718 and calculated osmolality 322, has soft BP, likely due to dehydration from HHS. No significant precipitating factors, UA pending. Will give novolog 10 units now. Will admit to the hospital for rehydration and insulin while holding her metformin during her stay in hospital.

## 2010-11-29 NOTE — Progress Notes (Signed)
  Subjective:    Patient ID: Renee Watts, female    DOB: 20-Sep-1950, 60 y.o.   MRN: [id number]  HPI Patient is a 60 years old AAF with past medical history  as outlined here who comes to the Clinic for high CBG. She has been feeling polyuria, thirsty, also poor intake and fatigue, mild dizziness. She has not checked her CBG during the past 2 weeks until today and found her CBG >600. She also lost weight about 20 lbs since last visit in 06/2010. Before that she checked her CBG daily and usually less than 200. She has no fever, chill, chest pain, shortness of breath, hemoptysis, abdominal pain, nausea, vomiting, diarrhea, melena, dysuria. Denies recent smoking, alcohol or drug abuse. Has been taking all his medications as instructed.       Review of Systems Per HPI.  Current Outpatient Medications Current Outpatient Prescriptions  Medication Sig Dispense Refill  . acetaminophen-codeine (TYLENOL #3) 300-30 MG per tablet Take 1 tablet by mouth every 6 (six) hours as needed.        Marland Kitchen aspirin 81 MG tablet Take 81 mg by mouth daily.        . furosemide (LASIX) 40 MG tablet Take 40 mg by mouth daily.        Marland Kitchen lisinopril (PRINIVIL,ZESTRIL) 20 MG tablet Take 20 mg by mouth daily.        . metFORMIN (GLUCOPHAGE) 1000 MG tablet Take 1,000 mg by mouth 2 (two) times daily with a meal.        . omeprazole (PRILOSEC) 20 MG capsule Take 20 mg by mouth daily as needed.        . simvastatin (ZOCOR) 40 MG tablet Take 40 mg by mouth at bedtime.          Allergies Review of patient's allergies indicates no known allergies.  Past Medical History  Diagnosis Date  . Hyperlipidemia   . Hypertension   . GERD (gastroesophageal reflux disease)   . Depression   . Arthritis   . Low back pain     Past Surgical History  Procedure Date  . Tubal ligation   . Abdominal hysterectomy   . Tonsillectomy        Objective:   Physical Exam General: Vital signs reviewed and noted.  Well-developed,well-nourished,in no acute distress; alert,appropriate and cooperative throughout examination. Head: normocephalic, atraumatic. Neck: No deformities, masses, or tenderness noted. Lungs: Normal respiratory effort. Clear to auscultation BL without crackles or wheezes.  Heart: RRR. S1 and S2 normal without gallop, murmur, or rubs.  Abdomen: BS normoactive. Soft, Nondistended, non-tender.  No masses or organomegaly. Extremities: No pretibial edema. Neurol: A&O x3, no focal neurological findings.          Assessment & Plan:

## 2010-11-30 DIAGNOSIS — R7309 Other abnormal glucose: Secondary | ICD-10-CM

## 2010-11-30 LAB — BASIC METABOLIC PANEL
BUN: 13 mg/dL (ref 6–23)
BUN: 8 mg/dL (ref 6–23)
CO2: 26 mEq/L (ref 19–32)
CO2: 26 mEq/L (ref 19–32)
Calcium: 8.9 mg/dL (ref 8.4–10.5)
Chloride: 115 mEq/L — ABNORMAL HIGH (ref 96–112)
Creatinine, Ser: 0.73 mg/dL (ref 0.4–1.2)
Creatinine, Ser: 0.63 mg/dL (ref 0.4–1.2)
GFR calc non Af Amer: >60 mL/min (ref >60)
Glucose, Bld: 361 mg/dL — ABNORMAL HIGH (ref 70–99)
Glucose, Bld: 313 mg/dL — ABNORMAL HIGH (ref 70–99)
Potassium: 3.9 mEq/L (ref 3.5–5.1)

## 2010-11-30 LAB — GLUCOSE, CAPILLARY
Glucose-Capillary: 318 mg/dL — ABNORMAL HIGH (ref 70–99)
Glucose-Capillary: 288 mg/dL — ABNORMAL HIGH (ref 70–99)
Glucose-Capillary: 186 mg/dL — ABNORMAL HIGH (ref 70–99)

## 2010-12-01 ENCOUNTER — Other Ambulatory Visit: Payer: Self-pay | Admitting: Internal Medicine

## 2010-12-01 DIAGNOSIS — E119 Type 2 diabetes mellitus without complications: Secondary | ICD-10-CM

## 2010-12-01 DIAGNOSIS — R7309 Other abnormal glucose: Secondary | ICD-10-CM

## 2010-12-01 LAB — BASIC METABOLIC PANEL
BUN: 7 mg/dL (ref 6–23)
CO2: 24 mEq/L (ref 19–32)
Calcium: 8.6 mg/dL (ref 8.4–10.5)
Chloride: 113 mEq/L — ABNORMAL HIGH (ref 96–112)
Creatinine, Ser: 0.56 mg/dL (ref 0.4–1.2)

## 2010-12-01 LAB — URINE CULTURE
Colony Count: 75000
Culture  Setup Time: 201204042110

## 2010-12-01 LAB — GLUCOSE, CAPILLARY: Glucose-Capillary: 244 mg/dL — ABNORMAL HIGH (ref 70–99)

## 2010-12-03 NOTE — Discharge Summary (Signed)
Brief Summary of Hospitalization: 60yo W with DM2, previously well controlled on metformin, admitted with several week history of increasing fatigue and polydipsia. Serum glucose 718 with serum osmolality elevated at 322. She was volume depleted with orthostatic hypotension and acute renal insufficiency (creatinine 1.25). She was hydrated aggressively with IVF and started on subcutaneous insulin. CBGs came down to 200s, serum osmolality improved, and she was feeling better. She was given a sample Lantus flexpen from the clinic and started on 10units of Lantus QHS in addition to metformin. She was trained on use of flexpen and demonstrated ability to inject herself. She has no health insurance so, when she runs out of Lantus sample, the plan is to prescribe a 70/30 flexpen. We reviewed the cost of 70/30 flexpen with her and she says she will be able to afford it.   Labs to be drawn at follow-up: BMET  Follow-up issues to be addressed: 1. DM2. Patient discharged on 10units of Lantus QHS in addition to metformin 1000mg  po BID. She was given a sample Lantus flexpen from the clinic. Patient instructed to check CBG at home each morning before breakfast. When she follows up in clinic, plan is to prescribe a 70/30 flexpen, which she will be better able to afford than Lantus. She also needs to follow up with Jamison Neighbor as soon as possible for further diabetes education.  2. HTN. Home lisinopril (and Lasix, which it is unclear if she was taking) was held on admission because of volume depletion. She remained normotensive with SBP in the 120s despite no antihypertensives. She was discharged on no antihypertensives. On follow-up, it should be considered whether or not to restart lisinopril. 3. Acute renal insufficiency. Creatinine elevated on admission 2/2 dehydration, resolved with IVF. I recommend checking a BMET on follow-up.

## 2010-12-05 ENCOUNTER — Emergency Department (HOSPITAL_COMMUNITY)
Admission: EM | Admit: 2010-12-05 | Discharge: 2010-12-06 | Disposition: A | Discharge status: Home / Self Care | Payer: Self-pay | Attending: Emergency Medicine | Admitting: Emergency Medicine

## 2010-12-05 DIAGNOSIS — R509 Fever, unspecified: Secondary | ICD-10-CM | POA: Insufficient documentation

## 2010-12-05 DIAGNOSIS — E1169 Type 2 diabetes mellitus with other specified complication: Secondary | ICD-10-CM | POA: Insufficient documentation

## 2010-12-05 DIAGNOSIS — I10 Essential (primary) hypertension: Secondary | ICD-10-CM | POA: Insufficient documentation

## 2010-12-05 DIAGNOSIS — Z794 Long term (current) use of insulin: Secondary | ICD-10-CM | POA: Insufficient documentation

## 2010-12-05 LAB — GLUCOSE, CAPILLARY

## 2010-12-06 ENCOUNTER — Telehealth: Payer: Self-pay | Admitting: *Deleted

## 2010-12-06 LAB — GLUCOSE, CAPILLARY
Glucose-Capillary: 392 mg/dL — ABNORMAL HIGH (ref 70–99)
Glucose-Capillary: 395 mg/dL — ABNORMAL HIGH (ref 70–99)

## 2010-12-06 NOTE — Telephone Encounter (Signed)
Would have her take 15 units of Lantus today, push fluids and come in tomorrow as scheduled

## 2010-12-06 NOTE — Telephone Encounter (Signed)
Pt informed

## 2010-12-06 NOTE — Telephone Encounter (Signed)
Pt called and states she was seen in ED last night  for elevated CBG.  Reading 526 She was d/c at 0600 with CBG 186. She takes metformin 1000 mg bid and lantus 10  Daily.  She has appointment with Jamison Neighbor tomorrow and on 4/19 in clinic. She has not been feeling well and not eating much, this is not new. She states she has a foul taste in mouth.  She had insure yesterday, not sure if the elevated the CBG.  I will talk with Lupita Leash and see if she thinks pt needs to be seen sooner than the 19th. Pt has not checked CBG today.

## 2010-12-07 ENCOUNTER — Ambulatory Visit: Payer: Self-pay | Admitting: Dietician

## 2010-12-07 MED ORDER — INSULIN GLARGINE 100 UNIT/ML ~~LOC~~ SOLN
25.0000 [IU] | Freq: Every day | SUBCUTANEOUS | Status: DC
Start: 2010-12-07 — End: 2011-12-07

## 2010-12-07 NOTE — Patient Instructions (Signed)
Increase your lantus to 25 units tonight.  Try to keep carbohydrate intake between 30-60 grams for meals and 15 grams for snacks  Continue to check your blood sugar each morning.  Make follow up after you get orange card or at next week appointment

## 2010-12-07 NOTE — Progress Notes (Signed)
385 fasting this am after 15 units of lantus  Diabetes Self-Management Training (DSMT)  Initial Visit  12/07/2010 [name], identified by name and date of birth, is a 60 y.o. female with Type 2 Diabetes. Year of diabetes diagnosis: 2001 Other persons  present: no  ASSESSMENT Patient concerns are Glycemic control. Reports usual bowel movements, urine output back to normal, feels pretty good, but blood sugars still high. Eats fairly healthy diet, knows some foods that affect blood sugars, but lacking adequate knowledge. Little activity reported other than ADLs that keep her busy. Has been limiting food and carbs due to bad taste in mouth, poor appetite. Also limiting drinks with carbs since hospitalization. Has started looking at labels to see how food or beverage will affect blood sugar. Prefers to be on insulin pills, but will take insulin using pens if she has to( insisted she would not try vials) working on financial assistance.   There were no vitals taken for this visit. There is no height or weight on file to calculate BMI. Lab Results  Component Value Date   LDLCALC 95 04/05/2010   Lab Results  Component Value Date   HGBA1C  Value: 11.7 (NOTE)                                                                       According to the ADA Clinical Practice Recommendations for 2011, when HbA1c is used as a screening test:   >=6.5%   Diagnostic of Diabetes Mellitus           (if abnormal result  is confirmed)  5.7-6.4%   Increased risk of developing Diabetes Mellitus  References:Diagnosis and Classification of Diabetes Mellitus,Diabetes Care,2011,34(Suppl 1):S62-S69 and Standards of Medical Care in         Diabetes - 2011,Diabetes Care,2011,34  (Suppl 1):S11-S61.* 11/29/2010    Labs reviewed.  DIABETES BUNDLE: A1C in past 6 months? Yes.  Less than 7%? No LDL in past year? Yes.  Less than 100 mg/dL? Yes Microalbumin ratio in past year? Yes. Blood pressure less than 130/80? Yes. Foot  exam in last year? Yes. Eye exam in past year? Yes. Tobacco use? No. Pneumovax? Yes Flu vaccine? Yes Asprin? Yes  Family history of diabetes: Yes- mother, 3/8 siblings and nephew Support systems: friends and family Special needs: None Prior DM Education: Yes   Medications See Medications list.  Is interested in learning more and Needs skills/knowledge review  Patients belief/attitude about diabetes: Diabetes can be controlled.  Self foot exams daily: Yes  Diabetes Complications: None   Exercise Plan Doing walking for 30 minutesa day.   Self-Monitoring Frequency of testing: daily Breakfast: 385 this am and has been > 300  Patient reports she had stopped testing all together while on metformin   Hyperglycemia: Yes Daily Hypoglycemia: No   Meal Planning Some knowledge and No interest in improving   Assessment comments: patient reports feeling better, but not quite herself yet. Is limiting carbs for the most part currently, eating some fruit, lite foods, has lost weight from  195 to 175 in recent months. Reports recent eye infection but no retinopathy per Dr. Mitzi Davenport.   INDIVIDUAL DIABETES EDUCATION PLAN:  Nutrition management Medication _______________________________________________________________________  Intervention TOPICS COVERED  TODAY:  Nutrition management  Role of diet in the treatment of diabetes and the relationship between the three main macronutritents and blood glucose control. Food label reading, portion sizes and measuring food. Medication  Taught/evaluated insulin injection, site rotation, insulin storage and needle disposal. Reviewed patients medication for diabetes, action, purpose, timing of dose and side effects.  PATIENTS GOALS/PLAN (copy and paste in patient instructions so patient receives a copy): 1.  Learning Objective:       Patient will be able to identify food groups that contain significant carb content              Patient will  inject insulin as ordered. 2.  Behavioral Objective:         Nutrition: To improve blood glucose control I will follow meal plan of 30-60 grams carb per meal and 15 grams per snack Sometimes 25% Medications: To improve blood glucose levels, I will take my medication as prescribed Always 100%  Personalized Follow-Up Plan for Ongoing Self Management Support:  Doctor's Office, friends, family and CDE visits ______________________________________________________________________   Outcomes Expected outcomes: Demonstrated interest in learning.Expect positive changes in lifestyle.  Self-care Barriers: Lack of material resources  Education material provided: picture sheets on carb foods and portions  Patient to contact team via Phone if problems or questions.  Time in: 1015     Time out: 1100  Future DSMT - 4-6 wks   RILEY,DONNA

## 2010-12-08 ENCOUNTER — Telehealth: Payer: Self-pay | Admitting: Dietician

## 2010-12-08 NOTE — Telephone Encounter (Signed)
Called patient to follow up on blood sugars: patient says her blood sugar was in 400s last night before she took the 25 units of lantus and her 'sugar pill' (metformin). This morning her blood sugar was 254. We discussed that her lantus pen should last another 7 -10 days and she should discuss this with the doctor she is to see next Thursday as she has no insurance.

## 2010-12-14 ENCOUNTER — Ambulatory Visit (INDEPENDENT_AMBULATORY_CARE_PROVIDER_SITE_OTHER): Payer: Self-pay | Admitting: Internal Medicine

## 2010-12-14 VITALS — BP 112/77 | HR 84 | Ht 66.0 in | Wt 185.5 lb

## 2010-12-14 DIAGNOSIS — E876 Hypokalemia: Secondary | ICD-10-CM

## 2010-12-14 DIAGNOSIS — I10 Essential (primary) hypertension: Secondary | ICD-10-CM

## 2010-12-14 DIAGNOSIS — M1990 Unspecified osteoarthritis, unspecified site: Secondary | ICD-10-CM

## 2010-12-14 DIAGNOSIS — E119 Type 2 diabetes mellitus without complications: Secondary | ICD-10-CM

## 2010-12-14 DIAGNOSIS — R609 Edema, unspecified: Secondary | ICD-10-CM

## 2010-12-14 LAB — BASIC METABOLIC PANEL WITH GFR
BUN: 2 mg/dL — ABNORMAL LOW (ref 6–23)
CO2: 32 mEq/L (ref 19–32)
Calcium: 8.7 mg/dL (ref 8.4–10.5)
Chloride: 104 mEq/L (ref 96–112)
Creat: 0.45 mg/dL (ref 0.40–1.20)

## 2010-12-14 MED ORDER — POTASSIUM CHLORIDE CRYS ER 20 MEQ PO TBCR: EXTENDED_RELEASE_TABLET | ORAL | Status: DC

## 2010-12-14 MED ORDER — INSULIN GLARGINE 100 UNIT/ML ~~LOC~~ SOLN: 25.0000 [IU] | Freq: Every day | SUBCUTANEOUS | Status: DC

## 2010-12-14 MED ORDER — GLIPIZIDE 10 MG PO TABS: 10.0000 mg | ORAL_TABLET | Freq: Two times a day (BID) | ORAL | Status: DC

## 2010-12-14 NOTE — Assessment & Plan Note (Signed)
She's not taking any lisinopril at this time. This was discontinued at the time of hospital discharge. Today on her blood pressures were well controlled with Lasix alone. Lasix is given to her because of her pedal edema. On her next visit we can try to change her Lasix to at least twice a day dose and will benefit her pressure if needed. One may decide to just give small dose of lisinopril along with Lasix. This will help protect her kidneys.  As I am already changing medication for diabetes, I have kept Hypertension medications same. This would allow the patient to understand the changes and do them as instructed

## 2010-12-14 NOTE — Patient Instructions (Signed)
Return in two weeks with your meter. You are started on new medications for your diabetes control

## 2010-12-14 NOTE — Assessment & Plan Note (Signed)
Appears low worse today. She is on Lasix. Continue to follow.

## 2010-12-14 NOTE — Assessment & Plan Note (Signed)
Patient is continued on Lantus pen 25 units once nightly. Her sugars are elevated between 200-250. She has not brought her meter today. I have instructed her to do so in 2 weeks. She voices understanding. Heart was going to help her with the cost of Lantus pen. Patient does not want to use the needles and and therefore prefers Lantus pen. She's also started on glipizide. She has never been on some Augmentin in the past. This was done with the hope that it would decrease the need of insulin. This will allow her to continue her the Lantus pen. Continue to follow.

## 2010-12-14 NOTE — Progress Notes (Signed)
  Subjective:    Patient ID: Renee Watts, female    DOB: 04-Mar-1951, 60 y.o.   MRN: [id number]  HPI 60 year old female with past medical history type 2 diabetes, hypertension, hyperlipidemia and depressive disorder is here for hospital followup. She was recently admitted in the hospital for hyperosmolar diabetic nonketoic acidosis. Patient was discharged home on insulin which was started new for her. Before the patient was only taking metformin. Patient was also suffering for eye infection and allergy symptoms.  After the discharge patient has done well. She reports that she continues to feel fatigued but otherwise doing okay. Her sugars at home are as measured by the glucometer it ranges between 200-250. Patient is taking 25 units of Lantus as prescribed. She's also taking metformin. She's not taking lisinopril at this time.  Patient reports that she can afford getting Lantus pen with help of falls from her daughter. She will item good [employer] in and buy 5 pens at the time which decreases the cost. Patient also reports that she is suffering from severe arthritic pain on her left knee. In the past she has been provided Tylenol #3 and that helps. Patient inquires if she can go on prednisone as one of her friends given. In she's also been injected in the knee and for her last injection was in October of last year. The patient wonders if she can get another injection in her knee. Patient also reports that she had 3 or 4 injections done on the same knee in the past posterior last 2-3 years. She has been referred to sports medicine physician in the past and told that she might need knee replacement.   Review of Systems  All other systems reviewed and are negative.       Objective:   Physical Exam  Constitutional: She is oriented to person, place, and time. She appears well-developed and well-nourished.  HENT:  Head: Normocephalic and atraumatic.  Right Ear: External ear normal.  Left Ear:  External ear normal.  Eyes: Conjunctivae and EOM are normal. Pupils are equal, round, and reactive to light.  Neck: Normal range of motion. Neck supple. No tracheal deviation present. No thyromegaly present.  Cardiovascular: Normal rate, regular rhythm and intact distal pulses.   Pulmonary/Chest: Effort normal and breath sounds normal. No respiratory distress.  Abdominal: Soft. Bowel sounds are normal. She exhibits no distension. There is no tenderness.  Musculoskeletal: She exhibits tenderness.       Left knee: tenderness found. Medial joint line tenderness noted.  Neurological: She is alert and oriented to person, place, and time. She has normal reflexes.  Skin: Skin is warm.  Psychiatric: She has a normal mood and affect. Her behavior is normal. Thought content normal.          Assessment & Plan:

## 2010-12-14 NOTE — Assessment & Plan Note (Addendum)
This is likely related to her recent hyperglycemia. As patient has improved her glycemic control percussion was moving intracellularly creating hypokalemia. Repleted with potassium chloride tablets. I would give her total of 4 tablets to be taken every day one tablet for next 4 days. Then stop. Remeasurement on next visit. She's also on Lasix. She she might need to be repleted on a continued basis but in the past she has been hypokalemic. Decide on regular supplementation next visit and repeat metabolic profile

## 2010-12-14 NOTE — Assessment & Plan Note (Signed)
Patient has been injected several times in the left knee with steroid. The last injection according to the patient was about 6 months ago. Patient may need to receive another injection.

## 2010-12-20 ENCOUNTER — Encounter: Payer: Self-pay | Admitting: Internal Medicine

## 2010-12-20 NOTE — Discharge Summary (Signed)
Renee Watts, SABATINO             ACCOUNT NO.:  [id number]  MEDICAL RECORD NO.:  [id number]           PATIENT TYPE:  I  LOCATION:  5524                         FACILITY:  [facility]  PHYSICIAN:  C. Ulyess Mort, M.D.DATE OF BIRTH:  Sep 21, 1950  DATE OF ADMISSION:  11/29/2010 DATE OF DISCHARGE:  12/01/2010                              DISCHARGE SUMMARY   DISCHARGE DIAGNOSES: 1. Hyperosmolar hyperglycemic state. 2. Hypertension. 3. Hyperlipidemia. 4. Osteoarthritis. 5. Depression. 6. Gastroesophageal reflux disease.  DISCHARGE MEDICATIONS: 1. Lantus insulin 10 units injected subcutaneously daily at bedtime. 2. Aspirin 81 mg 1 tablet by mouth daily. 3. Metformin 1000 mg 1 tablet by mouth twice daily. 4. Prilosec 1 capsule by mouth daily as needed for acid reflux. 5. Simvastatin 40 mg 1 tablet by mouth daily. 6. Tylenol with Codeine No. 3 300/30 mg 1 tablet by mouth every 4     hours as needed for pain.  DISPOSITION AND FOLLOWUP:  Renee Watts was discharged from [facility] on December 01, 2010, in stable and improved condition.  Her symptoms of fatigue and polydipsia had improved and her blood glucose was under better control.  In addition to her previously prescribed dose of metformin, she was started on 10 units of Lantus insulin at bedtime for control of her diabetes.  She was given a sample Lantus pen from the clinic.  Because she has no health insurance, the plan is for her to switch to a 70/30 insulin FlexPen once she runs out of the sample.  The cost of the 70/30 FlexPen was discussed and the patient said that she would be able to afford it.  She will follow up in the clinic with Dr. Sherryll Burger on April 19, at which time her glycemic control and her current insulin regimen should be addressed.  She will also need a BMET to be checked at that visit and it should be considered whether or not to restart antihypertensives.  She was previously taking lisinopril 20 mg daily but  this was held on discharge.  She should also follow up with Jamison Neighbor for diabetes education as soon as possible.  CONSULTATIONS:  None.  PROCEDURES PERFORMED:  None.  ADMISSION HISTORY:  Renee Watts is a 60 year old pleasant woman with past medical history of type 2 diabetes who presented to Dr. Threasa Beards in the [facility] with complaints of not feeling well for the past 2 weeks.  She reported having a sensation of cotton in her throat with increased thirst and urination.  She stated that she would drink lemonade nonstop, 2 cartons of fruit punch, and [employer] fruit cups for the 2 days prior to admission.  She reported feeling warm but denied any fever, chills, diaphoresis, headache, dizziness, cough, sore throat, neck pain, chest pain, shortness of breath, palpitations, abdominal pain, constipation, diarrhea, dysuria, hematuria, leg swelling or rash. She reported 1 episode of blurred vision while attending a Sunday church service on April 2.  She also reported watery eyes with redness and was evaluated by an ophthalmologist who gave her some eyedrops that relieved her symptoms.  The patient denied any change in her  medication regimen, endorsed compliance with all her medications and denied any use of steroids, exposure to sick persons or recent travel.  A BMET was drawn at the clinic with blood glucose level of 718.  Of note, the patient is type 2 diabetic with last hemoglobin A1c of 6.1 in November 2011.  ADMITTING PHYSICAL EXAMINATION:  VITAL SIGNS:  Temperature 99.0, blood pressure standing 90/70, heart rate 80, blood pressure lying 100/70, heart rate 84, oxygen saturation 97% on room air, respiratory rate 17. GENERAL APPEARANCE:  Well developed, well nourished, but looks dry, in no acute distress, alert, appropriate and cooperative throughout examination. HEENT:  Head, normocephalic, atraumatic.  Eyes, extraocular motions intact and pupils equal, round and  reactive bilaterally.  No conjunctival pallor or scleral icterus bilaterally. NECK:  No deformities, masses or tenderness.  No JVD. LUNGS:  Normal respiratory effort.  Clear to auscultation bilaterally without crackles or wheezes. HEART:  Regular rate and rhythm.  S1 and S2 normal without gallop, murmur, or rub. ABDOMEN:  Bowel sounds normoactive, soft, nondistended, nontender.  No masses or organomegaly. EXTREMITIES:  No pretibial edema.  Right knee joint deformity with an overlying old and well-healed scar noted.  No joint erythema, effusion or tenderness bilaterally.  SKIN:  Poor turgor.  No rashes. NEURO:  Nonfocal. PSYCH:  Memory intact to recent and remote, nonanxious and not depressed appearing  ADMITTING LABORATORY DATA:  Sodium 137, potassium 5.1, chloride 98, bicarb 25, glucose 718, BUN 24, creatinine 1.25, calcium 9.8, anion gap 14.  HOSPITAL COURSE: 1. Hyperosmolar hyperglycemic state.  The patient presented with     symptoms of worsening fatigue and polydipsia over the past few     weeks.  She had orthostatic hypotension on exam.  Her type 2     diabetes had previously been well controlled on metformin with last     hemoglobin A1c 6.1 in November 2011.  On admission, however, serum     glucose was 718 and calculated serum osmolality was elevated at     322, anion gap was only slightly elevated at 14, hemoglobin A1c was     11.4.  She had a normal lipase.  Urinalysis, negative urine culture     and normal white blood cell count.  No precipitating etiology for     this hyperglycemic state, such as infection or medication, could be     identified; therefore, it was felt that this hyperglycemia     represented natural progression of diabetes.  She was hydrated     aggressively with IV fluids and started on subcutaneous insulin.     Her serum glucose came down to the 200s, serum osmolality improved     and she began feeling less fatigued.  Prior to discharge, she was      educated on how to inject herself with insulin using a FlexPen     which she demonstrated she could do comfortably.  She will use a     Lantus FlexPen sample initially.  However when she follows up in     the clinic, she should be prescribed a 70/30 FlexPen as this will     be more affordable for her given her lack of insurance.  She will     check her blood sugar daily in the morning before breakfast and     call the clinic if she notices any very high or low readings.  In     addition to following up with Dr. Sherryll Burger, she should  also be seen as     soon as possible by Jamison Neighbor for further diabetes education. 2. Hypertension.  The patient's home dose of lisinopril was held on     admission because of dehydration.  Blood pressure remained largely     normotensive with systolic  blood pressure in the 120s despite     holding this medication.  Therefore, she was instructed not to     restart lisinopril until she follows up in the clinic, at which     time this medication may be restarted if her blood pressure is     again elevated. 3. Osteoarthritis.  The patient continued on home dose of Tylenol 3     for pain management throughout admission. 4. Acute renal insufficiency.  On admission, creatinine was elevated to     1.25 which was felt to be prerenal secondary to dehydration,     creatinine returned to normal with IV fluid hydration.  A BMET     should be drawn to follow up on creatinine at followup in the     clinic.  DISCHARGE VITALS:  Temperature 98.0, blood pressure 122/77, heart rate 72, respiratory rate 20, oxygen saturation 97% on room air.  DISCHARGE LABORATORY DATA:  Sodium 144, potassium 4.4, chloride 113, bicarb 24, BUN 7, creatinine 0.56, glucose 304, calcium 8.6.    ______________________________ Whitney Post, MD   ______________________________ C. Ulyess Mort, M.D.    EB/MEDQ  D:  12/03/2010  T:  12/03/2010  Job:  [id number]  cc:   Dr. Otho Bellows  Electronically Signed by Whitney Post MD on 12/19/2010 05:06:57 PM Electronically Signed by Eliezer Lofts M.D. on 12/20/2010 12:43:31 PM

## 2010-12-26 ENCOUNTER — Encounter: Payer: Self-pay | Admitting: Internal Medicine

## 2010-12-26 ENCOUNTER — Ambulatory Visit (INDEPENDENT_AMBULATORY_CARE_PROVIDER_SITE_OTHER): Payer: Self-pay | Admitting: Internal Medicine

## 2010-12-26 VITALS — BP 124/79 | HR 83 | Temp 99.8°F | Ht 65.0 in | Wt 190.4 lb

## 2010-12-26 DIAGNOSIS — E119 Type 2 diabetes mellitus without complications: Secondary | ICD-10-CM

## 2010-12-26 DIAGNOSIS — E876 Hypokalemia: Secondary | ICD-10-CM

## 2010-12-26 DIAGNOSIS — I10 Essential (primary) hypertension: Secondary | ICD-10-CM

## 2010-12-26 DIAGNOSIS — M1710 Unilateral primary osteoarthritis, unspecified knee: Secondary | ICD-10-CM

## 2010-12-26 LAB — BASIC METABOLIC PANEL
BUN: 8 mg/dL (ref 6–23)
CO2: 24 mEq/L (ref 19–32)
Calcium: 9.4 mg/dL (ref 8.4–10.5)
Creat: 0.56 mg/dL (ref 0.40–1.20)
Glucose, Bld: 58 mg/dL — ABNORMAL LOW (ref 70–99)

## 2010-12-26 MED ORDER — INSULIN GLARGINE 100 UNIT/ML ~~LOC~~ SOLN: 20.0000 [IU] | Freq: Every day | SUBCUTANEOUS | Status: DC

## 2010-12-26 MED ORDER — POTASSIUM CHLORIDE CRYS ER 20 MEQ PO TBCR: 20.0000 meq | EXTENDED_RELEASE_TABLET | Freq: Every day | ORAL | Status: DC

## 2010-12-26 MED ORDER — DICLOFENAC SODIUM 75 MG PO TBEC: 75.0000 mg | DELAYED_RELEASE_TABLET | Freq: Two times a day (BID) | ORAL | Status: DC

## 2010-12-26 NOTE — Patient Instructions (Signed)
Please, reduce lantus dose to 20 units before bedtime. Please, call for lab results and further instructions on potassium in 2 days. Please, start taking diclofenac for your arthritis and call if it does not work. Follow up in 3 months and call with questions.

## 2010-12-28 ENCOUNTER — Ambulatory Visit: Payer: Self-pay | Admitting: Internal Medicine

## 2011-01-02 NOTE — Assessment & Plan Note (Signed)
Improved glycemic control with insuline therapy. Medications, diet, exercise, weight management reviewed with the patient.

## 2011-01-02 NOTE — Progress Notes (Signed)
  Subjective:    Patient ID: Renee Watts, female    DOB: Oct 04, 1950, 60 y.o.   MRN: [id number]  HPI follow up on DM. Denies any hypoglycemic or hyperglycemic events. Watches her diet and exercises daily by walking for 30-45 min/daily.    Review of Systems  Constitutional: Negative.   Eyes: Negative.   Respiratory: Negative.   Cardiovascular: Negative.   Gastrointestinal: Negative.   Genitourinary: Negative.   Musculoskeletal: Positive for arthralgias.  Neurological: Negative.   Hematological: Negative.   Psychiatric/Behavioral: Negative.        Objective:   Physical Exam     General: Vital signs reviewed and noted. Well-developed, well-nourished, in no acute distress; alert, appropriate and cooperative throughout examination.  Head: Normocephalic, atraumatic.  Neck: No deformities, masses, or tenderness noted.  Lungs:  Normal respiratory effort. Clear to auscultation BL without crackles or wheezes.  Heart: RRR. S1 and S2 normal without gallop, murmur, or rubs.  Abdomen:  BS normoactive. Soft, Nondistended, non-tender.  No masses or organomegaly.  Extremities: No pretibial edema.                               Assessment & Plan:

## 2011-01-11 ENCOUNTER — Telehealth: Payer: Self-pay | Admitting: *Deleted

## 2011-01-11 NOTE — Telephone Encounter (Signed)
Pt called to get results of lab work. Also c/o rt ankle swelling during day, swelling goes down at night. ( tender)  Denies injury. Onset 6 weeks ago. Would you want to see her for this? She also mentions that her CBG's have been running in low 90's.  She is questioning the  Insulin she is taking.    I will forward to Lupita Leash R to address insulin question. Do you want pt seen?

## 2011-01-11 NOTE — Telephone Encounter (Signed)
I schedule appointment for tomorrow as swelling has been going on for several weeks.  Pt is on lasix and K

## 2011-01-12 ENCOUNTER — Telehealth: Payer: Self-pay | Admitting: Dietician

## 2011-01-12 ENCOUNTER — Ambulatory Visit (INDEPENDENT_AMBULATORY_CARE_PROVIDER_SITE_OTHER): Payer: Self-pay | Admitting: Internal Medicine

## 2011-01-12 VITALS — BP 130/75 | HR 79 | Temp 98.7°F | Ht 65.0 in | Wt 190.0 lb

## 2011-01-12 DIAGNOSIS — R609 Edema, unspecified: Secondary | ICD-10-CM

## 2011-01-12 DIAGNOSIS — E119 Type 2 diabetes mellitus without complications: Secondary | ICD-10-CM

## 2011-01-12 DIAGNOSIS — R600 Localized edema: Secondary | ICD-10-CM

## 2011-01-12 NOTE — Discharge Summary (Signed)
[place]  Patient:    Renee Watts, Renee Watts Visit Number: [id number] MRN: [id number]          Service Type: SUR Location: 5000 5029 01 Attending Physician:  Carolan Shiver Ii Dictated by:   Jamelle Rushing, P.A.-C. Admit Date:  02/23/2002 Discharge Date: 02/26/2002   CC:         Bruce H. Swords, M.D. [facility]   Discharge Summary  ADMISSION DIAGNOSES: 1. End-stage osteoarthritis right knee. 2. Obesity.  DISCHARGE DIAGNOSES: 1. Right total knee arthroplasty. 2. Obesity.  HISTORY OF PRESENT ILLNESS:  The patient is a 60 year old black female with approximately one year history of right knee pain which has progressively worsened with time and the amount of activity.  The pain is located along the joint line.  Initially she was weak and falling due to the knee giving out. She does have popping and catching in the knee.  She does have night pain. She does have stiffness with activity and she is currently using a knee brace to assist in ambulation.  X-rays reveal medial bone on bone with slight valgus deformity.  ALLERGIES:  No known drug allergies.  MEDICATIONS: Premarin 0.625 mg p.o. q.d.  SURGICAL PROCEDURE:  On February 23, 2002, the patient was taken to the operating room by Dr. Erasmo Leventhal assisted by Dr. Vear Clock.  Under general anesthesia, the patient underwent a right LCS total knee replacement, fully cemented.  The patient tolerated the procedure well.  One medium Hemovac drain was left in place.  Operative time was one hour 10 minutes.  The patient tolerated the surgical procedure well, and the patient did receive a postoperative femoral nerve block for routine postoperative care management.  CONSULTATIONS:  Routine physical therapy, occupational therapy and case management consults were requested.  HOSPITAL COURSE: On February 23, 2002, the patient was admitted to [facility] under the care of Dr. Jonny Ruiz Rendall.  The  patient was taken to the operating room where a right LCS total knee replacement was performed.  The patient tolerated this procedure well, received a postoperative femoral nerve block and then was transferred to the recovery room and then to the orthopedic floor for routine postoperative care.  The patient then incurred a total of three days postoperative care in which she had no significant untoward events.  Her vital signs remained stable.  She remained afebrile.  Her labs and chemistries all remained in an acceptable range.  The patients wound remained benign for any signs of infection.  Her leg remained neurovascularly intact.  The patient worked well with physical therapy and was felt to be ready for discharge to home with routine outpatient physical therapy followup.  The patient was placed on a Arixtra for routine DVT prophylaxis.  LABORATORY DATA:  H&H on February 26, 2002, hemoglobin 11.8, hematocrit 34.2. Routine chemistries on February 17, 2002, sodium 140, potassium 3.8, glucose 135, BUN 10, creatinine 0.6.  Routine urinalysis was normal with the exception of a few epithelial cells. No bacteria was noted.  Electrocardiogram on admission was 86 beats per minute with some left atrial enlargement, PRT axes of 51, 38 and 12.  DISCHARGE MEDICATIONS: (Upon discharge from orthopedic floor) 1. Ambien 10 mg p.o. q.h.s. 2. Premarin 0.625 mg p.o. q.d. 3. Arixtra 2.5 mg subcu q.24h. 4. Colace 100 mg p.o. b.i.d. 5. Senokot 8.6 mg p.o. b.i.d. 6. OxyContin CR 20 mg p.o. q.12h. 7. Percocet one of two tablets every four to six hours p.r.n.  DISCHARGE INSTRUCTIONS: 1. Medications:  OxyContin CR 20 mg take one tablet every 12 hours.    Percocet take one or two tablets every four to six hours for    breakthrough pain.  Ambien 10 mg take one tablet at night for    sleep as needed.  Arixtra 2.5 mg subcutaneous injection once a day    until gone. 2. Pain management:  See the use of OxyContin and  Percocet. 3. Activity:  Weightbearing as tolerated with the use of a walker    as instructed by physical therapy. 4. Diet:  No restrictions. 5. Wound care:  Keep wound clean and dry.  Change dressing daily.  May    shower tomorrow.  Apply dry dressing after shower. 6. Followup:  See Dr. Priscille Kluver in one week.  Call for an appointment.  DISCHARGE CONDITION:  The patients condition on discharge to home is listed as good. Dictated by:   Jamelle Rushing, P.A.-C. Attending Physician:  Carolan Shiver Ii DD:  03/14/02 TD:  03/20/02 Job: [id number]

## 2011-01-12 NOTE — Op Note (Signed)
[place]. [facility]  Patient:    Renee Watts, Renee Watts Visit Number: [id number] MRN: [id number]          Service Type: SUR Location: 5000 5029 01 Attending Physician:  Carolan Shiver Ii Dictated by:   Carlisle Beers. Dorothyann Gibbs, M.D. Proc. Date: 03/23/02 Admit Date:  02/23/2002 Discharge Date: 02/26/2002                             Operative Report  PREOPERATIVE DIAGNOSIS:  Arthrofibrosis, right knee.  POSTOPERATIVE DIAGNOSIS:  Arthrofibrosis, right knee.  OPERATION PERFORMED:  Closed manipulation under general anesthesia.  SURGEON:  Excell Seltzer. Annabell Howells, M.D.  ANESTHESIA:  General.  INDICATIONS FOR PROCEDURE:  Four weeks posttotal knee with arthrofibrosis, flexion only to 58 degrees with therapy now with panic attacks.  JUSTIFICATION FOR INPATIENT STAY:  Epidural anesthesia to maintain CPM and control panic attacks with medication  DESCRIPTION OF PROCEDURE:  Under general anesthesia, the left femur was flexed at the hip and the knee flexed only to approximately 60 degrees.  Pressure was then applied to the front of the tibia below her total knee and slowly steadily flexion occurred to approximately 105 degrees.  Full extension was obtained without difficulty.  No loud crunches or pops heard as the adhesions were immature at this point.  A sterile six-inch Ace wrap was then applied and then Dr. Jacklynn Bue inserted an epidural catheter for epidural anesthesia while she is in her CPM.  The patient tolerated the procedure and returned to recovery doing well. Dictated by:   Carlisle Beers. Dorothyann Gibbs, M.D. Attending Physician:  Carolan Shiver Ii DD:  03/23/02 TD:  03/24/02 Job: [id number]

## 2011-01-12 NOTE — Discharge Summary (Signed)
NAME:  Renee Watts, Renee Watts                       ACCOUNT NO.:  [id number]   MEDICAL RECORD NO.:  [id number]                   PATIENT TYPE:  INP   LOCATION:  5027                                 FACILITY:  [facility]   PHYSICIAN:  John L. Rendall, M.D.               DATE OF BIRTH:  September 09, 1950   DATE OF ADMISSION:  02/01/2003  DATE OF DISCHARGE:  02/05/2003                                 DISCHARGE SUMMARY   Discharged to home.   ADMISSION DIAGNOSIS:  Scarred-down right total knee arthroplasty with loss  of range of motion.   DISCHARGE DIAGNOSIS:  Revision, right total knee arthroplasty with extensive  scar excision and debridement with patella poly exchange and postoperative  radiation treatment.   HISTORY OF PRESENT ILLNESS:  Patient is a 60 year old black female with a  history of right total knee arthroplasty in 2003.  Patient has had a  constant problem with pains and with range of motion due to scar tissue, had  a closed manipulation in July of 2003 with only short-term improvement.  The  patient, prior to this admission, was 0 to 30 degrees active range of motion  with extreme pain.  X-rays revealed the patella slowly traversing down  towards the tibial tray, indicating scar tissue, tightening quads, and  locking extensor mechanism.   ALLERGIES:  No known drug allergies.   CURRENT MEDICATIONS:  Premarin 0.625 mg p.o. daily.   SURGICAL PROCEDURE:  On February 01, 2003, patient was taken to the OR by Dr.  Jonny Ruiz L. Rendall, M.D., and Rexene Edison, P.A.-C.  Under general anesthesia, the  patient underwent revision of the total knee with a change out of the  patellar component and tibial tray and extensive release of arthrofibrosis.  Patient tolerated the procedure well, there were no complications, and the  patient was able to obtain 0 to 95 degrees at the end of the case without  difficulty.   CONSULTS:  The following routine consults were requested:  1. Physical therapy.  2.  Occupational therapy.  3. Case management.  4. 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   HOSPITAL COURSE:  On February 01, 2003, patient was admitted to [facility] under the care of Dr. Jonny Ruiz L. Rendall, M.D.  The patient was taken  to the OR where a revision of the right total knee arthroplasty with  exchange of patella component and tibial tray component and extensive  arthrofibrosis excision was performed.  The patient tolerated the procedure  well and was discharged to the recovery room and then to the orthopedic  floor for routine postop care.   On postop day number one, patient was evaluated by the radiation oncology  services and was radiated once with radiation to prevent reoccurrence of the  excessive scar tissue around the knee;  patient tolerated this procedure well  without any problems.  The patient then incurred a total of four days of  postoperative care on the orthopedic floor without any further problems.  The patient progressed very well with range of motion and decrease in pain.  The patient was very happy with her initial improvement in range of motion  without discomfort.  Patient's wound was benign for any signs of infection.  Her leg remained neuromotor vascularly intact.  Patient's labs also remained  within normal limits without any problems.  Patient was able to achieve 95  degrees on the CPM prior to discharge, and it was felt on postop day number  four that the patient was ready, and the patient was discharged to home with  outpatient physical therapy arrangements made.  The patient was also going  to receive an outpatient CPM for use for two weeks.   LABORATORY DATA:  CBC on February 04, 2003, WBC is 11.7, hemoglobin 12.1,  hematocrit 34.9, platelets 198.   On February 03, 2003, routine chemistries, sodium of 143, potassium of 4.0,  glucose 128,  BUN 5, creatinine 0.5.   MEDICATIONS:  Upon discharge from ortho floor:  1. ___________ 2.5 mg subcu q.24h.  2. Premarin 0.625 mg p.o. daily.  3. OxyContin CR 10 mg p.o. q.12h.  4. Percocet one or two tablets every four to six hours for breakthrough     pain.  5. Tylenol 650 mg p.o. q.4h. p.r.n.  6. Robaxin 500 mg p.o. q.6h. p.r.n.  7. Restoril 15 mg p.o. q.h.s. p.r.n.   DISCHARGE INSTRUCTIONS   MEDICATIONS:  1. Patient is to resume routine home meds.  2. OxyContin CR 10 mg one tablet twice per day for the next 10 days.  3. Percocet one or two tablets every four to six hours for breakthrough     pain.   ACTIVITY:  1. Patient may be weightbearing as tolerated.  2. Patient should continue with CPM at least 10 to 12 hours per day while at     home.  Patient is to achieve maximum amount of range of motion tolerable.   DIET:  No restrictions.   WOUND CARE:  1. Patient should keep wound clean.  2. Patient should call Dr. Sable Feil office if there are any questions of     infection.   FOLLOW UP:  Patient should call [phone] for a followup appointment with Dr.  Priscille Kluver in 14 days.   SPECIAL INSTRUCTIONS:  Patient is to call and set up outpatient physical  therapy; a prescription was provided with the patient on discharge.   CONDITION UPON DISCHARGE TO HOME:  Improved and good.     Jamelle Rushing, P.A.                      John L. Priscille Kluver, M.D.    RWK/MEDQ  D:  02/05/2003  T:  02/06/2003  Job:  [id number]

## 2011-01-12 NOTE — Op Note (Signed)
[place]. [facility]  Patient:    Renee Watts, Renee Watts Visit Number: [id number] MRN: [id number]          Service Type: SUR Location: 5000 5029 01 Attending Physician:  Carolan Shiver Ii Dictated by:   Burna Forts, M.D. Proc. Date: 03/23/02 Admit Date:  02/23/2002 Discharge Date: 02/26/2002                             Operative Report  PREOPERATIVE DIAGNOSIS:  Status post knee replacement with knee pain and decreased range of motion.  POSTOPERATIVE DIAGNOSIS:  Status post knee replacement with knee pain and decreased range of motion.  OPERATION PERFORMED:  Knee manipulation under general anesthesia.  ANESTHESIA PROCEDURE:  Placement of epidural catheter for postoperative analgesia for in hospital knee range of motion.  We discussed this with the patient Ms. Henrikson for her postoperative pain control while she will be in the continuous motion machine upstairs for the next day and a half.  She consented to placement of the epidural catheter for her postoperative analgesia.  DESCRIPTION OF PROCEDURE:  At the end of the manipulation procedure, the patient was turned to the left lateral decubitus position and sterile prep of the lumbar area was conducted using a #17 gauge Tuohy needle adjacent to the L2-3 interspace.  The epidural space was contacted with good loss of resistance technique.  The catheter threaded approximately 3 to 4 cm beyond the needle tip and the needle was removed.  After negative aspiration for obvious heme or CSF, the catheter was injected with 0.25% Marcaine.  A total of 6 cc.  She will be placed on a continuous infusion of fentanyl Marcaine for postoperative analgesia.  COMPLICATIONS:  None apparent. Dictated by:   Burna Forts, M.D. Attending Physician:  Carolan Shiver Ii DD:  03/23/02 TD:  03/24/02 Job: [id number]

## 2011-01-12 NOTE — Patient Instructions (Addendum)
We will contact you to get an ultrasound of your legs performed next week to make sure there are no blood clots. Continue to take Lasix daily.  Check your blood sugar at least twice daily. If you blood sugar continues to stay under 120 without insulin, then do not administer insulin. Please call if your blood sugars rise above 250 or drop below 70.   Please contact Rudell Cobb for information on the orange card and other financial assistance programs.

## 2011-01-12 NOTE — H&P (Signed)
NAME:  Renee Watts, Renee Watts                       ACCOUNT NO.:  [id number]   MEDICAL RECORD NO.:  [id number]                   PATIENT TYPE:  INP   LOCATION:  5036                                 FACILITY:  [facility]   PHYSICIAN:  John L. Rendall, M.D.               DATE OF BIRTH:  12-27-1950   DATE OF ADMISSION:  03/23/2002  DATE OF DISCHARGE:                                HISTORY & PHYSICAL   CHIEF COMPLAINT:  Pain.  Loss of  range of motion and panic attacks with  physical therapy.  Right total knee arthroplasty.   HISTORY OF PRESENT ILLNESS:  The patient is a 60 year old black female with  a history of right total knee arthroplasty on  February 23, 2002.  The patient  tolerated the procedure well, did well with physical therapy and was  discharged to home in good condition.  During the patient's routine postop  rehab phase, the patient developed pain and gradual loss of range of motion  to the point where she was only able to get 60 degrees with locking of the  knee.  The patient was diagnosed with arthrofibrosis of the knee with loss  of range of motion.  The patient also developed panic attacks with each  physical therapy due to discomfort.  After further evaluation and  discussion, the patient was felt to benefit from a manipulation of her right  total knee arthroplasty under anesthesia with epidural placed for  postoperative pain management for 24 to 48 hours.  The patient was to be  placed immediately in CPM to improve range of motion.   The patient's history and physical were reviewed.  There are no changes  other than the pain meds added to postoperative care.  The patient's  physical exam was unchanged with the exception of the following.   PHYSICAL EXAMINATION:  EXTREMITIES:  The right knee had a well-healed  midline surgical incision.  It was round and swollen.  It was tender  throughout.  She had no sign of erythema.  She had a trace effusion.  Range  of motion was about five  degrees short of full extension and flexion back to  60 degrees with mechanical blocking and discomfort.  The patient's calf was  nontender.  Distal leg was neurovascularly intact.   PLAN:  The patient will be admitted for a right knee closed manipulation  under anesthesia with epidural placement for postoperative pain management  and aggressive CPM and physical therapy.          Jamelle Rushing, P.A.                      John L. Priscille Kluver, M.D.    RWK/MEDQ  D:  03/24/2002  T:  03/25/2002  Job:  [id number]

## 2011-01-12 NOTE — Telephone Encounter (Signed)
Patient reports blood sugars are all 73-117 mostly < 100 before all meals during day. She takes metformin and glipizide twice daily at 730 am and 930 PM, has not taken insulin for 2 nights and CBG this am fasting was 117 but she attributes this to eating sandwich before bed last PM. Does not report symptoms of hypoglycemia.   Has 3.5 more lantus pens left, but wants to know if she can stop insulin now. Told patient that she should bring meter and records and discuss with physician this afternoon at her appointment for her foot/leg swelling. If she stops insulin - the open pen should be discarded and the others can be kept in refrigerator until expiration date.

## 2011-01-12 NOTE — Op Note (Signed)
. [facility]  Patient:    Renee Watts, Renee Watts Visit Number: [id number] MRN: [id number]          Service Type: SUR Location: 5000 5029 01 Attending Physician:  Carolan Shiver Ii Dictated by:   Carlisle Beers. Dorothyann Gibbs, M.D. Proc. Date: 02/23/02 Admit Date:  02/23/2002                             Operative Report  INDICATIONS:  Chronic pain right knee, nonremitting over the last year despite conservative measures with x-rays showing bone against bone.  PREOPERATIVE DIAGNOSIS:  End-stage osteoarthritis, right knee.  PROCEDURE:  Right LCS total knee replacement, fully cemented.  POSTOPERATIVE DIAGNOSIS:  End-stage osteoarthritis, right knee.  SURGEON:  John L. Dorothyann Gibbs, M.D.  ASSISTANTJerolyn Shin. Tresa Res, M.D.  ANESTHESIA:  General anesthesia.  PATHOLOGY:  Bone against bone right knee with end-stage osteoarthritis and good hard bone.  DESCRIPTION OF PROCEDURE:  Under general anesthesia the right leg is prepared with Duraprep and draped as a sterile field.  The leg was wrapped out with Esmarch and a sterile tourniquet is elevated at 350 mmHg.  A midline incision is made, the patella is everted, the femur is sized to the standard.  The first tibial guide is used and the proximal tibial resection is carried out. Using the first femoral guide, an intercondylar drillhole is placed.  Using the second guide, the anterior and posterior flare of the distal femur are resected with a 12/5 balanced flexion gap.  The intermedullary guide is then used and a distal femoral cut is made with a 12/5 extension gap.  The recessing guide is then used.  Lamina spreader is inserted.  Remnants of the menisci and cruciates are resected.  Spurs off the back of the femoral condyles are removed.  Following this, the McEllen Homoner inserted, the tibia is size as a 2.5.  Center peghole with fins is placed.  Trial reduction of a 2.5 tibia with a 12.5 bearing and a  standard femur reveals excellent fit, alignment, and stability.  Following this, the patella is osteotomized, three pegholes placed with the patella trial in place and all the other trial components, the retinaculum was closed with towelclips.  The knee has excellent stability and alignment.  Permanent components are obtained while bony surfaces are prepared with pulse irrigation.  Drillholes are placed in the hard bone of the tibial metaphysis to except the cement.  All components are cemented in place.  Excess cement is removed.  The tourniquet is let down at about 52 minutes.  Multiple small vessels are cauterized. A medium hemovac drain is inserted.  The wound is then closed in layers with #1 Tycron, 0 and 2-0 Vicryl, and skin clips.  Operative time 1 hour 10 minutes.  The patient tolerated the procedure well and returned to the recovery room in good condition. Dictated by:   Carlisle Beers. Dorothyann Gibbs, M.D. Attending Physician:  Carolan Shiver Ii DD:  02/23/02 TD:  02/24/02 Job: [id number]

## 2011-01-12 NOTE — Op Note (Signed)
NAME:  Renee Watts, Renee Watts                       ACCOUNT NO.:  [id number]   MEDICAL RECORD NO.:  [id number]                   PATIENT TYPE:  INP   LOCATION:  2873                                 FACILITY:  [facility]   PHYSICIAN:  John L. Rendall III, M.D.           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   OPERATION PERFORMED:  Revision of total knee with change out of patellar  component, tibial tray and extensive release arthrofibrosis.   SURGEON:  John L. Rendall, M.D.   ASSISTANT:  Madilyn Fireman, P.A.-C.   ANESTHESIA:  General.   INDICATIONS FOR PROCEDURE:  The patient is approximately one and a half to  two years postoperative to total knee replacement.  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  She at the time  of this surgery comes in with 0 to 30 degrees motion and pain from a stiff  tight knee.   DESCRIPTION OF PROCEDURE:  Under general anesthesia, the patient's leg was  prepared with DuraPrep and draped as a sterile field.  Sterile tourniquet  was applied to the proximal thigh.  0 to 30 degrees motion again is  documented.  Leg was wrapped out with the Esmarch and the tourniquet was put  up at 350 mmHg.  The previous skin incision was excised.  Dissection was  carried down to the quadriceps and joint capsule.  After careful exposure,  the old suture line is reopened and care was taken to peel fascial sleeve  from the medial tibial plateau.  The patella was found almost right at the  joint line  having migrated distally.  The patellar button was removed from  the metal backing.  The suprapatellar pouch was adherent right at the top of  the femoral component allowing no tracking whatsoever.  Extensive release of  contractures in the whole medial gutter and up to the suprapatellar pouch  was done, identical was then done laterally.  The knee only flexed  comfortably down to about 60 degrees, extensive release up the lateral  gutter and the lateral patellar release was then done allowing better  flexion.  At this point the patellar tendon had not lengthened and it was  obvious that if the standard LCS patellar component was used there was some  likelihood of it catching at the poly at the joint line.  It was then  removed and removing the metal backing from the patella, the tibial tray was  then removed and lysis of adhesions posteriorly off the back of the  femoral  condyle has been done including intercondylar adhesions as well.  With all  of these released, the knee was irrigated with antibiotic solution.  A new  tibial tray was then inserted, LCS complete RPN inserts are standard 12.5  mm.  The patella was repaired with 35 mm oval bone patella. This was then  cemented in place with Depuy 1 bone cement.  After this was hardened,  Hemovac drain was inserted.  Tourniquet was let down.  The wound was then  closed in  layers with #1 Tycron, 0 and 2-0 Vicryl and skin clips.  Operative time was  one hour.  Knee flexion 95 degrees at the end of the case.  The patient will  be sent for radiation therapy in the first 24 hours and she will receive CPM  short of 90 degrees.                                                John L. Dorothyann Gibbs, M.D.    Renato Gails  D:  02/01/2003  T:  02/01/2003  Job:  [id number]

## 2011-01-12 NOTE — Discharge Summary (Signed)
NAME:  Renee Watts, Renee Watts                       ACCOUNT NO.:  [id number]   MEDICAL RECORD NO.:  [id number]                   PATIENT TYPE:  INP   LOCATION:  5036                                 FACILITY:  [facility]   PHYSICIAN:  John L. Rendall, M.D.               DATE OF BIRTH:  10-18-1950   DATE OF ADMISSION:  03/23/2002  DATE OF DISCHARGE:  03/26/2002                                 DISCHARGE SUMMARY   ADMISSION DIAGNOSIS:  Loss of range of motion, right total knee arthroplasty  due to arthrofibrosis.   DISCHARGE DIAGNOSIS:  Closed manipulation of right total knee arthroplasty  with improved range of motion.   HISTORY OF PRESENT ILLNESS:  The patient is a 60 year old black female with  a history of right total knee arthroplasty on February 23, 2002.  The patient  initially had good results with range of motion and decreased discomfort,  but developed some loss of range of motion with her outpatient physical  therapy, and started to develop some panic attacks with physical therapy due  to discomfort.  The patient was brought back into the hospital for a closed  manipulation under anesthesia and epidural management for aggressive range  of motion on the CPM.   ALLERGIES:  No known drug allergies.   CURRENT MEDICATIONS:  Claritin p.r.n., Premarin 0.625 mg p.o. q.d., Ambien  10 mg p.o. q.h.s. p.r.n., Percocet one tablet p.o. q.4h., calcium 600 mg  p.o. t.i.d.   PROCEDURE:  On March 23, 2002, the patient was taken to the OR by Erasmo Leventhal, M.D., and under general anesthesia, the patient underwent a closed  manipulation of the right total knee arthroplasty for arthrofibrosis.  The  patient was initially only able to flex her knee to 58 degrees.  Under  anesthesia, the patient was able to reach approximately 105 degrees.  The  patient had an epidural anesthesia placement for CPM for the next 24 hours.   CONSULTATIONS:  Routine physical therapy consult was requested.   HOSPITAL COURSE:   On February 21, 2002, the patient was admitted to [facility] under the care of Erasmo Leventhal, M.D.  The patient was taken to the  OR for a closed manipulation under general anesthesia.  The patient was  manipulated an estimated 105 degrees was obtained.  Postoperatively epidural  anesthesia was placed for pain management for a burst of CPM therapy.  The  patient then incurred a total of three days postoperative care on the  orthopedic floor.  The epidural stayed in place for 24 hours and the patient  was transitioned over to p.o. pain medications on postoperative day #2 with  continued good improvement with the CPM.  On postoperative day #3, the  patient was able to obtain 100 degrees on the CPM with just some p.o.  medications, antianxiety medications, and a muscle relaxant.  Arrangements  were made for the patient to be  discharged home with continued p.o.  medications for pain, muscle relaxant, and anxiety.  The patient also was  arranged to have a CPM upon discharge.  The patient was also supposed to  have a follow up appointment the following Tuesday with Dr. Priscille Kluver.   LABORATORY DATA:  CBC on March 20, 2002 - WBC is 5.9, hemoglobin 13.0,  hematocrit 39.3, platelets 252.  Routine chemistries - sodium 141, potassium  3.9, glucose 108, BUN 5, creatinine 0.6.   DISCHARGE MEDICATIONS:  _________ , Ambien 10 mg p.o. q.h.s. p.r.n., Xanax  0.5 mg p.o. t.i.d., Percocet one or two tablets every four to six hours  p.r.n. pain, Robaxin 500 mg p.o. q.6h. P.r.n.   DISCHARGE INSTRUCTIONS:  The patient is to resume routine medications.  Xanax 0.5 mg p.o. q.8h., Robaxin 500 mg one tablet by mouth every eight  hours as needed.  Pain management.  The patient is to continue using  Percocet on a p.r.n. basis.  Activity - the patient may resume activities as  tolerated.  The patient should be in the CPM at least eight hours of day.  The patient should try to achieve the highest range of motion  possible.   DIET:  No restrictions.   WOUND CARE:  Not applicable.   FOLLOW UP:  The patient should call for a follow up appointment with Dr.  Priscille Kluver the following Tuesday after discharge.   CONDITION ON DISCHARGE:  To home is listed as improved.      Jamelle Rushing, P.A.                      John L. Priscille Kluver, M.D.    RWK/MEDQ  D:  03/26/2002  T:  04/01/2002  Job:  [id number]

## 2011-01-12 NOTE — H&P (Signed)
[place]  Patient:    ITHZEL, FEDORCHAK Visit Number: [id number] MRN: [id number]          Service Type: Attending:  Carlisle Beers. Dorothyann Gibbs, M.D. Dictated by:   Jamelle Rushing, P.A. Adm. Date:  02/23/02                           History and Physical  INCOMPLETE  DATE OF BIRTH: 1951-05-30  CHIEF COMPLAINT: Right knee pain for about one year.  HISTORY OF PRESENT ILLNESS: The patient is a 60 year old black female, who has been having problems with her right knee for a little over one year now.  The patient states it initially started with just some general weakness in the knee with occasionally her knee giving out on her, causing her to fall.  The patient indicates that she has been having some pain also with this which has intensified significantly over the last several months, causing her to the emergency room several times due to the discomfort.  The pain is located primarily along the joint line, with no significant radiation.  She describes it as sharp shooting pain with weightbearing activities.  She does have popping and catching in the knee.  She does have night pain,.  The patient states that her knee gets significantly stiff with short periods of inactivity.  She is currently using a wraparound brace to give her some support.  X-rays of the knee reveal bone-on-bone, medial compartment of right knee, with slight valgus deformity.  ALLERGIES: No known drug allergies.  CURRENT MEDICATIONS:  1. Premarin 0.625 mg p.o. q.d.  2. Multiple natural herbs.  PAST MEDICAL HISTORY: The patient denies any significant medical history at this time.  PAST SURGICAL HISTORY:  1. Tonsillectomy.  2. Tubal ligation.  3. Hysterectomy.  The patient denies any complications with any of the above-mentioned surgical procedures.  SOCIAL HISTORY: The patient is a 60 year old black slightly centrally obese female.  Denies any history of smoking or  alcohol use.  She does have several children.  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  FAMILY PHYSICIAN: Valetta Mole. Swords, M.D., [phone].  FAMILY MEDICAL HISTORY: Mother is alive at 55 years of age, with a history of hypertension, diabetes, and coronary artery disease, with a five-way bypass. Father is deceased from a heart attack and a history of cancer.  The patient has two brothers alive, both in good medical health.  The patient has six sisters alive, two of whom have diabetes - the other four in good health.  REVIEW OF SYSTEMS: Positive for reading glasses.  She does have occasional problems with reflux but it has improved since taking Coral Calcium.  She denies any other medical issues or complaints related to general, sensory, respiratory, cardiac, GI, GU, hematologic, musculoskeletal, neurologic mental status at this time.  PHYSICAL EXAMINATION:  VITAL SIGNS: Height is 5 feet 6 inches.  Weight is 210 pounds.  Pulse 88 and regular, respirations 12, temperature 98.6 degrees,  blood pressure 136/72.  GENERAL: This is a healthy-appearing, well-developed, slightly centrally obese black female.  She ambulates with a brace over her right knee, stiff-legged and with a slight limp.  She is able to get on and off the examination table without much difficulty.  HEENT: Head normocephalic, atraumatic.  Nontender over maxillary or frontal sinuses.  PERRLA.  EOMI.  Sclerae  nonicteric.  Conjunctivae pink and moist. External ears without deformities.  Canals were slightly erythematous but nontender with manipulation.  TMs were pearly gray and intact.  Gross hearing was intact.  The nasal septum was midline and mucous membranes pink and moist. No polyps noted.  Oral buccal mucosa was pink and moist without lesions. Dentition was in good repair.  Uvula midline.  The patient was able to swallow without any  difficulty.  NECK: Supple.  No palpable lymphadenopathy.  Thyroid gland was nontender.  She had good range of motion of her cervical spine without any difficulty or tenderness.  She had no tenderness along the spinous process of the entire spinal cord. Dictated by:   Jamelle Rushing, P.A. Attending:  Carlisle Beers. Dorothyann Gibbs, M.D. DD:  02/17/02 TD:  02/18/02 Job: [id number]

## 2011-01-12 NOTE — H&P (Signed)
[place]  Patient:    Renee Watts, Renee Watts Visit Number: [id number] MRN: [id number]          Service Type: Attending:  Carlisle Beers. Dorothyann Gibbs, M.D. Dictated by:   Jamelle Rushing, P.A. Adm. Date:  02/23/02                           History and Physical  CONTINUATION  DATE OF BIRTH:  01-16-1951  LUNGS:  Clear and equal bilaterally.  No wheezes, rales, rhonchi, rubs noted.  HEART:  Regular rate and rhythm.  S1, S2 auscultated.  No murmurs, rubs, or gallops noted.  ABDOMEN:  Round, soft.  Nontender to deep palpation.  No palpable hepatosplenomegaly.  CVA was nontender to percussion.  Bowel sounds were normoactive throughout.  EXTREMITIES:  Upper extremities were symmetrical in size and shape.  Excellent range of motion of her shoulders, elbows, and wrists, without any difficulty. Motor strength 5/5.  Lower extremities:  Right and left hip had full extension.  Flexion up to 120 degrees on the left, 95 degrees on the right, limited by knee pain.  There was 20 degrees of internal and external rotation without any difficulty, tenderness, or mechanical symptoms.  Right knee:  The patient had a wrap-around support brace in place.  There was no sign of erythema or ecchymosis.  She was extremely tender along the medial joint line, only slightly lateral.  No palpable effusion.  Range of motion was about 3 degrees of full extension.  Flexion back to 95 degrees.  No significant valgus or varus laxity.  No anterior or posterior drawer.  She had a significant amount of crepitus on the patella with range of motion.  The calf was nontender.  Left knee:  There was no sign of erythema or ecchymosis. There was no palpable effusion.  No medial or lateral joint line tenderness. Range of motion was normal, 0-120 degrees.  No valgus or varus laxity.  No anterior or posterior drawer.  The calf was nontender.  Bilateral ankles were symmetrical, with good  dorsoplantar flexion.  Peripheral vasculature:  Carotid pulses were 2+, no bruits.  Radial pulses 2+.  Femoral pulses nonpalpable. Dorsalis pedis and posterior tibial pulses 2+.  No significant lower extremity edema.  Various varicosities about the lower extremities.  NEUROLOGIC:  Conscious, alert, and appropriate.  Had an easy conversation with examiner.  Cranial nerves II-XII grossly intact.  Deep tendon reflexes of the upper and lower extremities were symmetrical right to left.  BREASTS, RECTAL, GENITOURINARY:  Deferred at this time.  IMPRESSION: 1. End-stage osteoarthritis right knee. 2. Obesity.  PLAN:  The patient will be admitted to [facility] on February 23, 2002, under the care of Dr. Jonny Ruiz L. Rendall.  The patient will undergo all routine laboratories and tests prior to having a right total knee arthroplasty performed. Dictated by:   Jamelle Rushing, P.A. Attending:  Carlisle Beers. Dorothyann Gibbs, M.D. DD:  02/17/02 TD:  02/18/02 Job: [id number]

## 2011-01-12 NOTE — H&P (Signed)
NAME:  Renee Watts, Renee Watts                ACCOUNT NO.:  [id number]   MEDICAL RECORD NO.:  [id number]                  PATIENT TYPE:  I   LOCATION:                                       FACILITY:  [facility]   PHYSICIAN:  John L. Rendall, M.D.               DATE OF BIRTH:  1950-10-17   DATE OF ADMISSION:  02/01/2003  DATE OF DISCHARGE:                                HISTORY & PHYSICAL   CHIEF COMPLAINT:  1. Loss of range of motion and pain.  2. Right total knee arthroplasty.   HISTORY OF PRESENT ILLNESS:  The patient is a 60 year old black female with  a history of right total knee arthroplasty in June of 2003.  The patient has  had constant problems with range of motion.  She was taken back to the OR in  July of 2003 for closed manipulation, epidural placement and extensive CPM  management of the patient's knee to maintain range of motion.  The patient  initially did fairly well, but gradually lost range of motion over this last  year to the point in which she currently only has about 30 degrees range of  motion from full extension.  The patient has pain with any attempts to force  her knee and with the minimal amounts of 30 degrees range of motion that she  has at the current time.   LABORATORY DATA:  X-rays at this time shows that on a lateral view, the  patella is slowly being forced down towards the tibial tray, indicating scar  tissue locking in the extensor mechanism and the quads to the point at which  she is unable to bend it.   ALLERGIES:  No known drug allergies.   CURRENT MEDICATIONS:  Premarin 0.625 mg p.o. every day   CURRENT MEDICAL HISTORY:  The patient denies any significant medical issues.   PAST SURGICAL HISTORY:  1. Tonsillectomy.  2. Tubal ligation.  3. Hysterectomy.  4. Right total knee arthroplasty.  5. Closed manipulation of right total knee arthroplasty.   The patient denies any complications of the above-mentioned surgical  procedures.   SOCIAL HISTORY:  This is a 60 year old black female who denies any history  of smoking or alcohol use.  She does have several children.  She lives in a  [place] house. She is currently self-employed as a Retail buyer.   FAMILY PHYSICIAN:  Valetta Mole. Swords, M.D.  [phone]).   FAMILY HISTORY:  Mother is alive, but she has a history of hypertension,  diabetes and coronary artery disease with five-vessel coronary artery bypass  graft.  Father is deceased from a heart attack and history of cancer.  The  patient has two brothers, both in good health, and six sisters, two of whom  have diabetes.  Four others are in good health.   REVIEW OF SYSTEMS:  Positive for glasses.  The patient still has problems  with occasional reflux.  Over the counter medications  do control her  symptoms.  Otherwise, all other review of systems categorized are  noncontributory.   PHYSICAL EXAMINATION:  VITAL SIGNS:  The patient's height is 5 feet, 6  inches.  Weight 216 pounds.  Pulses is 76 and regular.  Respirations 16.  Temperature 98.0.  Blood pressure is 144/88.  GENERAL:  This is a healthy-appearing, well-developed, slightly heavy-set  black female.  She ambulates with a cane in her right hand, and her right  leg, straight leg, with minimal range of motion.  She sits on the exam table  with her leg fully extended out forward, unable to fully flex her knee.  HEENT:  Head was normocephalic, atraumatic, nontender maxillary and frontal  sinuses.  Pupils equal, round and reactive, accommodating to light.  Extraocular motions are intact.  Sclerae is without icterus.  Conjunctivae  is pink and moist.  External ears without deformity, canal is patent. The  TM's were pearly gray and intact.  Gross hearing is intact.  Nasal septum is  midline.  Oral buccal mucosa is pink and moist and without lesions.  Dentition was in good repair.  Uvula is midline.  The patient is able to  swallow without any difficulty.   NECK:  Supple, no palpable lymphadenopathy.  Thyroid region was nontender.  She had excellent range of motion of the cervical spine without any  difficulty or tenderness.  She had no tenderness along the entire spinal  column to percussion.  CHEST:  Lungs sounds were clear and equal bilaterally.  No wheezes, rales,  rhonchi, or rubs noted.  HEART:  Regular rate and rhythm.  S1 and S2 auscultated.  No murmurs, rubs  or gallops noted.  ABDOMEN:  Round, soft, nontender.  Bowel sounds were normoactive in the  throat.  No hepatosplenomegaly.  Her CVA region was nontender to percussion.  EXTREMITIES:  Upper extremities were symmetric in size and shape.  Motor  strength 5/5 in all muscle groups tested.  Lower extremities right and left  hip had full extension and flexion up to 120 degrees with 20 degrees  internal and external rest.  Rotation without difficulty or mechanical  symptoms.  The right knee had a well-healed midline surgical incision with  another vertical incision along the distal aspect.  There was no palpable  effusion, no sign of erythema or ecchymosis.  Her range of motion was  limited from 0 degrees to 30 degrees, limited by mechanical locking.  Here  was no medical or collateral instability.  The calf was nontender.  The left  knee was without signs of erythema or ecchymosis.  It had good range of  motion, minimal crepitance on the patella.  She has no instability and the  calf was nontender.  The patient's ankles were symmetrical with good dorsi  plantar flexion.  PERIPHERAL VASCULAR:  Peripheral vascular and carotid pulses were 2+ bruits.  The radial pulses were 2+.  Posterior tibia and dorsalis pedis pulses were  2+.  She had no lower-extremity edema or venous stasis changes or  pigmentation changes.  NEUROLOGIC:  The patient was conscious, alert and appropriate.  Easy conversation with the examiner.  Cranial nerves II-XII were grossly intact.  The patient was grossly  intact to light touch sensation from head to toe.  There were no gross neurologic defects noted.  BREAST/RECTAL/GU:  Exams were deferred at this time.   IMPRESSION:  Failed right total knee arthroplasty secondary to excessive  scarring, locking down the quad muscles and extensor  mechanism.   DISPOSITION:  The patient will be admitted to [facility] on February 01, 2003, under the care of John L. Rendall, M.D.  The patient will undergo a  revision of her right total knee arthroplasty with extensive debridement of  scar tissue.  The patient will then undergo radiation treatment as directed  by Wynn Banker, M.D., for prevention of reoccurrence of excessive  scar tissue.  Otherwise, the patient will undergo all other routine labs and  tests prior to having her revision performed.    Jamelle Rushing, P.A.                      John L. Priscille Kluver, M.D.   RWK/MEDQ  D:  01/21/2003  T:  01/21/2003  Job:  [id number]

## 2011-01-13 ENCOUNTER — Encounter: Payer: Self-pay | Admitting: Internal Medicine

## 2011-01-13 NOTE — Assessment & Plan Note (Signed)
Patient has apparently had lower extremity edema in the past but this is clearly a dramatic change. She has significant lower extremity edema bilaterally but much greater on the R, which is most likely related to history of knee replacement on that side. She has no erythema, warmth, or pain. Although this edema is dramatic, she has no overlying skin changes, such as hemosiderosis, which further suggests that this is a fairly acute change over the past few weeks. She has no clotting history or family history of clotting; however, DVT is still possible. LFTs, including albumin, and renal function were all normal when she was admitted last month and unlikely to have changed dramatically in this time period. Discussed this case with Dr. Ulyess Mort who also examined the patient. Most likely etiology is acute worsening of chronic venous insufficiency. However, will schedule the patient for lower extremity doppler studies early next week to rule out DVT. If negative, may advise patient to use support stockings and/or increase Lasix.

## 2011-01-13 NOTE — Assessment & Plan Note (Signed)
Patient started on insulin in April during hospitalization for hyperglycemia. She had been previously well controlled on oral agents. No precipitating factor, such as infection or pancreatitis, identified at that time. Patient reports decreased need for insulin progressing over the past few weeks. Her meter could not be downloaded but I clicked through it with her and confirmed that CBGs have dropped in past few weeks from 300s to 90-120s. She has taken no insulin for the past 2 days and reports CBGs 90-120s without insulin during that time. It is possible that her insulin requirement has resolved or decreased. Patient advised to continue to check her blood sugars regularly and to administer a reduced dose of insulin (5 units) in she notes that her CBGs again creep above 130. She can further titrate this up depending on fasting am glucose. She should follow-up in 1 month for further evaluation of her need for insulin therapy. She is also instructed to call if she develops hypoglycemia (CBG<70) or hyperglycemia >250 on insulin.

## 2011-01-13 NOTE — Progress Notes (Signed)
Subjective:    Patient ID: Renee Watts, female    DOB: 1950/12/11, 60 y.o.   MRN: [id number]  HPI 60yo W with DM2 presents for acute evaluation of: 1. Lower extremity edema. For the past several weeks (since mid April), she has noted increased lower extremity edema, R>>L. She initially thought it might be secondary to eating lots of pickles but says that she quickly stopped eating them and has been careful to limit her salt consumption but the swelling has not improved. The swelling is not painful. She denies fever/chills or recent injury to either lower extremity. She has a history of R knee replacement 7 years ago. She has no history of blood clots or family history of abnormal clotting. She states that she had some lower extremity swelling in the past but never this nearly severe. She recently restarted taking Lasix 40mg  daily for the past few weeks but does not think it has helped swelling.  2. Change in blood sugars/insulin requirement. In April, she was hospitalized with hyperglycemia and started on Lantus insulin. She has been using 20units daily. Over the past few weeks, she has noticed that her sugars have been steadily trending down on their own from the 300s to 200s to 100s. She has not taken any insulin for the past 2 days because she noted blood sugars less than 100 and she was worried about developing low blood sugar.   Current Outpatient Prescriptions on File Prior to Visit  Medication Sig Dispense Refill  . aspirin 81 MG tablet Take 81 mg by mouth daily.        . diclofenac (VOLTAREN) 75 MG EC tablet Take 1 tablet (75 mg total) by mouth 2 (two) times daily with a meal.  60 tablet  2  . furosemide (LASIX) 40 MG tablet Take 40 mg by mouth daily.        Marland Kitchen glipiZIDE (GLUCOTROL) 10 MG tablet Take 1 tablet (10 mg total) by mouth 2 (two) times daily.  60 tablet  11  . insulin glargine (LANTUS SOLOSTAR) 100 UNIT/ML injection Inject 20 Units into the skin at bedtime.  3 mL  12  .  metFORMIN (GLUCOPHAGE) 1000 MG tablet Take 1,000 mg by mouth 2 (two) times daily with a meal.        . omeprazole (PRILOSEC) 20 MG capsule Take 20 mg by mouth daily as needed.        . potassium chloride SA (K-DUR,KLOR-CON) 20 MEQ tablet Take 1 tablet (20 mEq total) by mouth daily. Take one tablet every six hours today.Take one tablet daily thereafter till 4/25 and then stop.  30 tablet  11  . DISCONTD: insulin glargine (LANTUS SOLOSTAR) 100 UNIT/ML injection Inject 25 Units into the skin at bedtime.  15 mL  12    Past Medical History  Diagnosis Date  . Hyperlipidemia   . Hypertension   . GERD (gastroesophageal reflux disease)   . Depression   . Arthritis   . Low back pain   . Osteoarthritis, knee     Bilateral, s/p 3 total knee replacement surgerues on right, last 2005. Dr. Shelle Iron  . Pellegrini-Steida syndrome     Myositis ossificans  . Obesity   . Dependent edema     Bilateral    Review of Systems  Constitutional: Negative for fever, chills, activity change and fatigue.  HENT: Negative for congestion, sore throat, rhinorrhea, sneezing and neck pain.   Eyes: Negative for visual disturbance.  Respiratory: Negative for cough, chest  tightness, shortness of breath and wheezing.   Cardiovascular: Negative for chest pain and palpitations.  Gastrointestinal: Negative for nausea, vomiting, abdominal pain, diarrhea, constipation, blood in stool and abdominal distention.  Genitourinary: Negative for dysuria.  Musculoskeletal: Negative for joint swelling, arthralgias and gait problem.       [Bilateral lower extremity swelling, greatest around R ankle Skin: Negative for rash.  Neurological: Negative for dizziness, weakness and headaches.  Psychiatric/Behavioral: Negative for suicidal ideas and dysphoric mood. The patient is not nervous/anxious.   [all other systems reviewed and are negative       Objective:   Physical Exam  [vitalsreviewed. Constitutional: She is oriented to person,  place, and time. She appears well-developed and well-nourished. No distress.  HENT:  Head: Normocephalic and atraumatic.  Mouth/Throat: Oropharynx is clear and moist. No oropharyngeal exudate.  Eyes: EOM are normal. Pupils are equal, round, and reactive to light. No scleral icterus.  Neck: Normal range of motion. Neck supple. No tracheal deviation present. No thyromegaly present.  Cardiovascular: Normal rate and regular rhythm.  Exam reveals no gallop and no friction rub.   No murmur heard. Pulmonary/Chest: Effort normal and breath sounds normal. No respiratory distress. She has no wheezes. She has no rales. She exhibits no tenderness.  Abdominal: Soft. Bowel sounds are normal. There is no tenderness.  Musculoskeletal: Normal range of motion.       Pitting pretibial edema in bilateral lower legs, greater on the R. No evidence of stasis dermatitis, no hemosiderosis.   Lymphadenopathy:    She has no cervical adenopathy.  Neurological: She is alert and oriented to person, place, and time. No cranial nerve deficit.  Skin: Skin is warm and dry. No rash noted. She is not diaphoretic. No erythema.       No stasis skin changes.   Psychiatric: She has a normal mood and affect. Her behavior is normal. Judgment and thought content normal.          Assessment & Plan:

## 2011-03-20 ENCOUNTER — Ambulatory Visit (INDEPENDENT_AMBULATORY_CARE_PROVIDER_SITE_OTHER): Payer: Self-pay | Admitting: Internal Medicine

## 2011-03-20 ENCOUNTER — Encounter: Payer: Self-pay | Admitting: Internal Medicine

## 2011-03-20 VITALS — BP 117/72 | HR 74 | Temp 98.1°F | Wt 194.0 lb

## 2011-03-20 DIAGNOSIS — E119 Type 2 diabetes mellitus without complications: Secondary | ICD-10-CM

## 2011-03-20 DIAGNOSIS — R600 Localized edema: Secondary | ICD-10-CM

## 2011-03-20 DIAGNOSIS — M1990 Unspecified osteoarthritis, unspecified site: Secondary | ICD-10-CM

## 2011-03-20 DIAGNOSIS — R609 Edema, unspecified: Secondary | ICD-10-CM

## 2011-03-20 LAB — POCT GLYCOSYLATED HEMOGLOBIN (HGB A1C): Hemoglobin A1C: 6.5

## 2011-03-20 MED ORDER — METFORMIN HCL 1000 MG PO TABS: 1000.0000 mg | ORAL_TABLET | Freq: Every day | ORAL | Status: DC

## 2011-03-20 MED ORDER — HYDROCODONE-ACETAMINOPHEN 5-500 MG PO TABS: ORAL_TABLET | ORAL | Status: DC

## 2011-03-20 NOTE — Patient Instructions (Signed)
Please, return fasting for blood work

## 2011-03-20 NOTE — Assessment & Plan Note (Signed)
History of uncontrolled DM. Will check HGBA1C today. DM, etiology, complications, treatment, diet, exercise, foot and eye care reviewed with the patient.

## 2011-04-02 NOTE — Progress Notes (Signed)
  Subjective:    Patient ID: Renee Watts, female    DOB: Apr 13, 1951, 60 y.o.   MRN: [id number]  HPI 1. DM follow up. Patient states that she has improved her diet and adherent with diet and exercise; "working" on her weight management. Denies any hypoglycemia. Patient is up-to date on her preventive care. Denies any concerns.   Review of Systems  Constitutional: Negative.   Eyes: Negative for visual disturbance.  Respiratory: Negative for chest tightness and shortness of breath.   Cardiovascular: Negative.   Genitourinary: Negative for frequency.  Neurological: Negative for dizziness, weakness and numbness.       Objective:   Physical Exam General: Vital signs reviewed and noted. Well-developed, well-nourished, in no acute distress; alert, appropriate and cooperative throughout examination.  Head: Normocephalic, atraumatic.  Neck: No deformities, masses, or tenderness noted.  Lungs:  Normal respiratory effort. Clear to auscultation BL without crackles or wheezes.  Heart: RRR. S1 and S2 normal without gallop, murmur, or rubs.  Abdomen:  BS normoactive. Soft, Nondistended, non-tender.  No masses or organomegaly.  Extremities: No pretibial edema.           Assessment & Plan:  1. DM, type two. Excellent control.  I commended her dedication to her health. Encouraged to continue with diet, exercise and adherence with medication regimen. Will follow up in 6 months. Patient is encouraged to call with any questions and/or concerns.

## 2011-04-04 ENCOUNTER — Ambulatory Visit (INDEPENDENT_AMBULATORY_CARE_PROVIDER_SITE_OTHER): Payer: Self-pay | Admitting: Internal Medicine

## 2011-04-04 ENCOUNTER — Encounter: Payer: Self-pay | Admitting: Internal Medicine

## 2011-04-04 VITALS — BP 114/71 | HR 64 | Temp 98.2°F | Ht 66.0 in | Wt 192.1 lb

## 2011-04-04 DIAGNOSIS — E119 Type 2 diabetes mellitus without complications: Secondary | ICD-10-CM

## 2011-04-04 DIAGNOSIS — H579 Unspecified disorder of eye and adnexa: Secondary | ICD-10-CM | POA: Insufficient documentation

## 2011-04-04 DIAGNOSIS — M1990 Unspecified osteoarthritis, unspecified site: Secondary | ICD-10-CM

## 2011-04-04 DIAGNOSIS — I10 Essential (primary) hypertension: Secondary | ICD-10-CM

## 2011-04-04 DIAGNOSIS — E876 Hypokalemia: Secondary | ICD-10-CM

## 2011-04-04 LAB — LIPID PANEL
Cholesterol: 202 mg/dL — ABNORMAL HIGH (ref 0–200)
HDL: 41 mg/dL (ref >39)
Triglycerides: 149 mg/dL (ref <150)

## 2011-04-04 LAB — POCT GLYCOSYLATED HEMOGLOBIN (HGB A1C): Hemoglobin A1C: 6.4

## 2011-04-04 MED ORDER — METFORMIN HCL 1000 MG PO TABS: 1000.0000 mg | ORAL_TABLET | Freq: Two times a day (BID) | ORAL | Status: DC

## 2011-04-04 NOTE — Assessment & Plan Note (Signed)
The symptoms that the patient is describing may represent an allergic skin reaction, bleeding due to makeup or other lotions the patient using, although the exact source is unclear at this time. May otherwise represent just fatigue and inadequate sleep. No evidence of infectious etiology appreciated today. - Will recommend keeping eyes cleanse daily. - And use warm compress over her eyes as needed. - Asked the patient to try to identify if certain lotions or makeups may be here dictating for causing reaction to the skin surrounding her eyes. - Patient is advised to return to the clinic sooner if she is having worsening of her symptoms, or compromise of vision.

## 2011-04-04 NOTE — Progress Notes (Signed)
Subjective:    Patient ID: Renee Watts, female    DOB: 10/26/1950, 60 y.o.   MRN: [id number]  HPI Pt is a 60 y.o. female who  has a past medical history of HLD, HTN OA, DMII and presents to clinic today for the following:  1) DM - Patient checking blood sugars 2 times daily, before breakfast and dinner. Reports fasting blood sugars of  130-140 mg/dL. Currently taking metformin (Glucophage, Riomet) 1000 mg before breakfast. 0 hypoglycemic episodes since last visit. denies polyuria, polydipsia, nausea, vomiting, diarrhea.  does not request refills today.  2) HTN - Patient does not check blood pressure regularly at home. Currently taking furosemide (Lasix). denies headaches, dizziness, lightheadedness, chest pain, shortness of breath.  does not request refills today.  3) Dark circles under eyes - patient indicates that she has had darkened circles under her eyes recently, although unable to really specify duration. By evening time, will occasionally have some skin irritation and mild erythema. She does not recall using any new medications, lotions, makeups. Does not wear contacts or glasses. Denies vision changes, excessive tearing of or foriegn body sensation in eyes, vision changes, redness or pain of the eyes, eye discharge, fevers, chills.    Review of Systems Per HPI.  Current Outpatient Medications Medication Sig  . aspirin 81 MG tablet Take 81 mg by mouth daily.    . diclofenac (VOLTAREN) 75 MG EC tablet Take 1 tablet (75 mg total) by mouth 2 (two) times daily with a meal.  . furosemide (LASIX) 40 MG tablet Take 40 mg by mouth daily.    Marland Kitchen HYDROcodone-acetaminophen (VICODIN) 5-500 MG per tablet Take one tablet daily as needed.  Marland Kitchen omeprazole (PRILOSEC) 20 MG capsule Take 20 mg by mouth daily as needed.    . metFORMIN (GLUCOPHAGE) 1000 MG tablet Take 1 tablet (1,000 mg total) by mouth daily with breakfast.  . potassium chloride SA (K-DUR,KLOR-CON) 20 MEQ tablet Take 10 mEq by mouth  daily.      Allergies Review of patient's allergies indicates no known allergies.  Past Medical History  Diagnosis Date  . Hyperlipidemia   . Hypertension   . GERD (gastroesophageal reflux disease)   . Depression   . Arthritis   . Low back pain   . Osteoarthritis, knee     Bilateral, s/p 3 total knee replacement surgerues on right, last 2005. Dr. Shelle Iron  . Pellegrini-Steida syndrome     Myositis ossificans  . Obesity   . Dependent edema     Bilateral  . Diabetes mellitus     Past Surgical History  Procedure Date  . Tubal ligation   . Abdominal hysterectomy 1984  . Tonsillectomy   . Total knee arthroplasty     with multiple revisions, 2003, 2005, 2007       Objective:   Physical Exam   Filed Vitals:   04/04/11 0920  BP: 114/71  Pulse: 64  Temp: 98.2 F (36.8 C)     General: Vital signs reviewed and noted. Well-developed, well-nourished, in no acute distress; alert, appropriate and cooperative throughout examination.  Head: Normocephalic, atraumatic.  Eyes: PERRL, EOMI. Dark circle under eyes, no scleral injection, no inflammation, warmth, or tenderness of eye lids. No appreciable rash.  Ears: TM nonerythematous, not bulging, good light reflex bilaterally.  Nose: Mucous membranes moist, not inflammed, nonerythematous.  Throat: Oropharynx nonerythematous, no exudate appreciated.   Neck: No deformities, masses, or tenderness noted.  Lungs:  Normal respiratory effort. Clear to auscultation BL without  crackles or wheezes.  Heart: RRR. S1 and S2 normal without gallop, murmur, or rubs.  Abdomen:  BS normoactive. Soft, Nondistended, non-tender.  No masses or organomegaly.  Extremities: No pretibial edema.  Neurologic: A&O X3, CN II - XII are grossly intact.       Assessment & Plan:  Case and plan of care discussed with Dr. Doneen Poisson.

## 2011-04-04 NOTE — Assessment & Plan Note (Signed)
BP Readings from Last 3 Encounters:  04/04/11 114/71  03/20/11 117/72  01/12/11 130/75    Basic Metabolic Panel:    Component Value Date/Time   NA 141 12/26/2010 1621   K 3.2* 12/26/2010 1621   CL 103 12/26/2010 1621   CO2 24 12/26/2010 1621   BUN 8 12/26/2010 1621   CREATININE 0.56 12/26/2010 1621   CREATININE 0.56 12/01/2010 0601   GLUCOSE 58* 12/26/2010 1621   CALCIUM 9.4 12/26/2010 1621    Assessment: Hypertension control:   controlled  Progress toward goals:   at goal Barriers to meeting goals:  no barriers identified  Plan: Hypertension treatment:   - continue current medications - labs today to evaluate status of hypokalemia

## 2011-04-04 NOTE — Patient Instructions (Addendum)
   Please follow-up at the clinic in 1-2 months, at which time we will reevaluate your diabetes.  Keep you your great work towards weight loss.  If you are diabetic, please bring your meter to your next visit.  If symptoms worsen, or new symptoms arise, please call the clinic or go to the ER.  Diabetes  Check blood sugars 2x/ day and then if having sugars < 70, cut back to 1 pill in AM and 1/2 pill in PM - and call the clinic.  Refill sent to your pharmacy.   Please bring all of your medications in a bag to your next visit.

## 2011-04-04 NOTE — Assessment & Plan Note (Signed)
Lab Results  Component Value Date   HGBA1C 6.5 03/20/2011   HGBA1C 11.5 11/29/2010   HGBA1C 11.6 11/29/2010   Lab Results  Component Value Date   MICROALBUR 3.59* 07/18/2010   LDLCALC 95 04/05/2010   CREATININE 0.56 12/26/2010    Assessment: Diabetes control:   controlled Progress toward goals:  unable to assess  Barriers to meeting goals:  no barriers identified  Plan: Diabetes treatment:    - increase metformin to 1000 BID - will send in new prescription - check blood sugars 2x/ day and then if having sugars < 70, cut back to 1 pill in AM and 1/2 pill in PM - reminded to bring blood glucose meter & log to each visit

## 2011-04-05 LAB — COMPLETE METABOLIC PANEL WITH GFR
BUN: 12 mg/dL (ref 6–23)
CO2: 26 mEq/L (ref 19–32)
Calcium: 9.7 mg/dL (ref 8.4–10.5)
Chloride: 104 mEq/L (ref 96–112)
Creat: 0.5 mg/dL (ref 0.50–1.10)
GFR, Est African American: >60 mL/min (ref >60)
GFR, Est Non African American: >60 mL/min (ref >60)
Glucose, Bld: 116 mg/dL — ABNORMAL HIGH (ref 70–99)
Total Bilirubin: 0.6 mg/dL (ref 0.3–1.2)

## 2011-05-14 ENCOUNTER — Other Ambulatory Visit: Payer: Self-pay | Admitting: *Deleted

## 2011-05-14 DIAGNOSIS — E876 Hypokalemia: Secondary | ICD-10-CM

## 2011-05-14 DIAGNOSIS — M1710 Unilateral primary osteoarthritis, unspecified knee: Secondary | ICD-10-CM

## 2011-05-14 DIAGNOSIS — I10 Essential (primary) hypertension: Secondary | ICD-10-CM

## 2011-05-14 DIAGNOSIS — E119 Type 2 diabetes mellitus without complications: Secondary | ICD-10-CM

## 2011-05-14 DIAGNOSIS — M179 Osteoarthritis of knee, unspecified: Secondary | ICD-10-CM

## 2011-05-14 MED ORDER — DICLOFENAC SODIUM 75 MG PO TBEC: 75.0000 mg | DELAYED_RELEASE_TABLET | Freq: Two times a day (BID) | ORAL | Status: DC

## 2011-05-28 LAB — GLUCOSE, CAPILLARY: Glucose-Capillary: 101 — ABNORMAL HIGH

## 2011-06-04 ENCOUNTER — Encounter: Payer: Self-pay | Admitting: Internal Medicine

## 2011-06-04 ENCOUNTER — Ambulatory Visit (INDEPENDENT_AMBULATORY_CARE_PROVIDER_SITE_OTHER): Payer: Self-pay | Admitting: Internal Medicine

## 2011-06-04 DIAGNOSIS — M1990 Unspecified osteoarthritis, unspecified site: Secondary | ICD-10-CM

## 2011-06-04 DIAGNOSIS — Z299 Encounter for prophylactic measures, unspecified: Secondary | ICD-10-CM

## 2011-06-04 DIAGNOSIS — E119 Type 2 diabetes mellitus without complications: Secondary | ICD-10-CM

## 2011-06-04 DIAGNOSIS — Z23 Encounter for immunization: Secondary | ICD-10-CM

## 2011-06-04 MED ORDER — HYDROCODONE-ACETAMINOPHEN 5-500 MG PO TABS: ORAL_TABLET | ORAL | Status: DC

## 2011-06-04 NOTE — Patient Instructions (Signed)
Please, watch your diet and continue with walking/swimming. Please, call with any questions. Follow up in in 4-6 months.

## 2011-06-04 NOTE — Progress Notes (Signed)
Addended by: Youlanda Roys A on: 06/04/2011 04:26 PM   Modules accepted: Orders

## 2011-06-04 NOTE — Progress Notes (Signed)
  Subjective:    Patient ID: Renee Watts, female    DOB: [birth year], 60 y.o.   MRN: [id number]  HPI  1. Follow up appointment. Denies any concerns in r/t DM management. 2. Refill on pain medication for R knee OA. Denies excessive sedation. 3. Flu shot.  Review of Systems  Constitutional: Negative.   Eyes: Negative.   Respiratory: Negative.   Cardiovascular: Negative.   Genitourinary: Negative.   Musculoskeletal: Negative.   Neurological: Negative.   Hematological: Negative.   Psychiatric/Behavioral: Negative.        Objective:   Physical Exam   Vitals: reviewed General: alert, well-developed, and cooperative to examination.  Head: normocephalic and atraumatic.  Eyes: vision grossly intact, pupils equal, pupils round, pupils reactive to light, no injection and anicteric.  Mouth: pharynx pink and moist, no erythema, and no exudates.  Neck: supple, full ROM, no thyromegaly, no JVD, and no carotid bruits.  Lungs: normal respiratory effort, no accessory muscle use, normal breath sounds, no crackles, and no wheezes. Heart: normal rate, regular rhythm, no murmur, no gallop, and no rub.  Abdomen: soft, non-tender, normal bowel sounds, no distention, no guarding, no rebound tenderness, no hepatomegaly, and no splenomegaly.  Msk: no joint swelling, no joint warmth, and no redness over joints.  Pulses: 2+ DP/PT pulses bilaterally Extremities: No cyanosis, clubbing, edema Neurologic: alert & oriented X3, cranial nerves II-XII intact, strength normal in all extremities, sensation intact to light touch, and gait normal.  Skin: turgor normal and no rashes.  Psych: Oriented X3, memory intact for recent and remote, normally interactive, good eye contact, not anxious appearing, and not depressed appearing.        Assessment & Plan:  1. DM, type 2. -no recent fluctuations in CBG's -watches her diet -little exercise ->has been counseled. -continue with Metformin 1 gm PO  bid. -Urine microalbumin -flu shot

## 2011-06-05 LAB — MICROALBUMIN / CREATININE URINE RATIO
Creatinine, Urine: 50.5 mg/dL
Microalb Creat Ratio: 21.6 mg/g (ref 0.0–30.0)
Microalb, Ur: 1.09 mg/dL (ref 0.00–1.89)

## 2011-07-24 ENCOUNTER — Encounter: Payer: Self-pay | Admitting: Internal Medicine

## 2011-07-24 ENCOUNTER — Ambulatory Visit (INDEPENDENT_AMBULATORY_CARE_PROVIDER_SITE_OTHER): Payer: Self-pay | Admitting: Internal Medicine

## 2011-07-24 DIAGNOSIS — M79609 Pain in unspecified limb: Secondary | ICD-10-CM

## 2011-07-24 DIAGNOSIS — M25559 Pain in unspecified hip: Secondary | ICD-10-CM

## 2011-07-24 DIAGNOSIS — M79659 Pain in unspecified thigh: Secondary | ICD-10-CM | POA: Insufficient documentation

## 2011-07-24 DIAGNOSIS — M25551 Pain in right hip: Secondary | ICD-10-CM

## 2011-07-24 DIAGNOSIS — E119 Type 2 diabetes mellitus without complications: Secondary | ICD-10-CM

## 2011-07-24 LAB — POCT GLYCOSYLATED HEMOGLOBIN (HGB A1C): Hemoglobin A1C: 6.6

## 2011-07-24 NOTE — Assessment & Plan Note (Signed)
Patient advised to take over-the-counter Tylenol and/or Advil. Referral was made for sports medicine while we are awaiting financial assistance for orthopedic referral. Will get x-ray of bilateral hips for comparison today.

## 2011-07-24 NOTE — Progress Notes (Signed)
  Subjective:    Patient ID: Renee Watts, female    DOB: [birth year], 60 y.o.   MRN: [id number]  HPI  Patient presents today with complaints of right inner thigh pain. She reports that she's had a baseline level of right inner thigh discomfort for well over a year now. This was not a concern at her last visit in early October. 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 This requires her to physically pick them up frequently. She reports that the pain is a burning, stinging and occasionally sharp shooting pain without radiation to her back nor below her knee. She denies lower back pain but reports that she has had lower back achiness. She reports that the Vicodin prescribed for her left knee osteoarthritis helps take the "edge" off of her right thigh pain but does not totally resolve.  She reports that she has not tried any over the counter analgesics and that the Vicodin actually made her nauseous.    Review of Systems  Constitutional: Negative for fever.  HENT: Negative for neck pain.   Eyes: Negative for visual disturbance.  Cardiovascular: Negative for chest pain.  Musculoskeletal: Positive for arthralgias. Negative for back pain.       + left knee pain, + lower bach aches  Neurological: Negative for weakness.       Objective:   Physical Exam  Constitutional: She is oriented to person, place, and time. She appears well-developed and well-nourished. No distress.  HENT:  Head: Normocephalic and atraumatic.  Neck: Normal range of motion. Neck supple.  Musculoskeletal:       Right hip: She exhibits tenderness and bony tenderness. She exhibits normal range of motion, no swelling, no crepitus and no deformity.       Legs:      Normal strength in bilaterally lower extremities    Neurological: She is alert and oriented to person, place, and time. She has normal strength and normal reflexes.          Assessment & Plan:  #1 right hip pain: clinical picture concerning for trochanteric bursitis or osteoarthritis of the hip. Of concern is that she has degenerative joint disease in her left knee which is causing her to compensate when bearing weight is putting more pressure on her right hip and lower extremity. Patient is without health insurance at this time. Her last right knee surgery was in 2009. The plan is to refer her to sports medicine for further evaluation and management. We will get x-ray of bilateral hips for comparison and put in for referral to orthopedics while we consult with Jaynee Eagles for possible financial assistance. Patient will followup with clinic after evaluation by sports medicine.

## 2011-07-24 NOTE — Patient Instructions (Addendum)
Today we will XRay your hip. We will refer you to the Sports Medicine Clinic and an Orthopaedist for your hip pain.  Tylenol or Advil over the counter may help with your discomfort, especially before bed. Please continue to take your medications as prescribed.  Make an appointment to return to clinic for a follow-up after you have been to Sports Medicine.

## 2011-08-09 ENCOUNTER — Ambulatory Visit: Payer: Self-pay | Admitting: Family Medicine

## 2011-08-09 ENCOUNTER — Ambulatory Visit (INDEPENDENT_AMBULATORY_CARE_PROVIDER_SITE_OTHER): Payer: Self-pay | Admitting: Family Medicine

## 2011-08-09 VITALS — BP 138/70 | Ht 66.0 in | Wt 192.0 lb

## 2011-08-09 DIAGNOSIS — M25559 Pain in unspecified hip: Secondary | ICD-10-CM

## 2011-08-09 DIAGNOSIS — M25551 Pain in right hip: Secondary | ICD-10-CM

## 2011-08-09 MED ORDER — MELOXICAM 15 MG PO TABS: 15.0000 mg | ORAL_TABLET | Freq: Every day | ORAL | Status: DC

## 2011-08-09 NOTE — Patient Instructions (Signed)
Stop diclofenac Try meloxicam: it is only once a day  When you get Renee Watts, get hip xray, then follow-up with me after F/u 1 month

## 2011-08-12 NOTE — Progress Notes (Signed)
  Patient Name: Renee Watts Date of Birth: 10-04-1950 Age: 60 y.o. Medical Record Number: [id number] Gender: female  History of Present Illness:  Renee Watts is a 60 y.o. very pleasant female patient who presents with the following:  Pleasant pt s/p TKA on L R sided hip and groin pain ongoing for at least a month, but she has also had symptoms ongoing off and on for many months. No back pain, no buttocks pain. Mostly in the groin and limitation with motion at the R hip. No trauma. No hx of fx. Intermittently taking vicodin about 1/2 to 1 tab a day  Significant pain keeping her up at night.  No recent hip films.  Past Medical History, Surgical History, Social History, Family History, and Problem List have been reviewed in EHR and updated if relevant.  Review of Systems:  GEN: No fevers, chills. Nontoxic. Primarily MSK c/o today. MSK: Detailed in the HPI GI: tolerating PO intake without difficulty Neuro: No numbness, parasthesias, or tingling associated. Otherwise the pertinent positives of the ROS are noted above.    Physical Examination: Filed Vitals:   08/09/11 1531  BP: 138/70  Height: 5\' 6"  (1.676 m)  Weight: 192 lb (87.091 kg)    Body mass index is 30.99 kg/(m^2).   GEN: WDWN, NAD, Non-toxic, Alert & Oriented x 3 HEENT: Atraumatic, Normocephalic.  Ears and Nose: No external deformity. EXTR: No clubbing/cyanosis/edema NEURO: antalgic gait PSYCH: Normally interactive. Conversant. Not depressed or anxious appearing.  Calm demeanor.   HIP EXAM: SIDE: R ROM: Abduction, Flexion, Internal and External range of motion: markedly limited in all direction Pain with terminal IROM and EROM: very GTB: NT SLR: NEG Knees: No effusion FABER: unable to rotate enough to assess REVERSE FABER: NT, neg Piriformis: NT at direct palpation Str: flexion: 4+/5 abduction: 4+/5 adduction: +/5 Strength testing non-tender LL equal     Assessment and Plan: 1. Hip pain,  right  meloxicam (MOBIC) 15 MG tablet, DG Hip Complete Right  2. Pain, joint, pelvic region or thigh, right      Concern for significant OA or other hip pathology. Plain films to assess.  If mild-severe hip oa on film, guided injection of corticosteroid could be tried.

## 2011-08-16 ENCOUNTER — Encounter: Payer: Self-pay | Admitting: Internal Medicine

## 2011-08-16 ENCOUNTER — Ambulatory Visit (INDEPENDENT_AMBULATORY_CARE_PROVIDER_SITE_OTHER): Payer: Self-pay | Admitting: Internal Medicine

## 2011-08-16 ENCOUNTER — Ambulatory Visit (HOSPITAL_COMMUNITY)
Admission: RE | Admit: 2011-08-16 | Discharge: 2011-08-16 | Disposition: A | Discharge status: Home / Self Care | Payer: Self-pay | Source: Ambulatory Visit | Attending: Family Medicine | Admitting: Family Medicine

## 2011-08-16 VITALS — BP 129/80 | HR 65 | Temp 97.7°F | Ht 66.0 in | Wt 193.3 lb

## 2011-08-16 DIAGNOSIS — M1610 Unilateral primary osteoarthritis, unspecified hip: Secondary | ICD-10-CM | POA: Insufficient documentation

## 2011-08-16 DIAGNOSIS — M25559 Pain in unspecified hip: Secondary | ICD-10-CM | POA: Insufficient documentation

## 2011-08-16 DIAGNOSIS — I10 Essential (primary) hypertension: Secondary | ICD-10-CM

## 2011-08-16 DIAGNOSIS — M25551 Pain in right hip: Secondary | ICD-10-CM

## 2011-08-16 DIAGNOSIS — M169 Osteoarthritis of hip, unspecified: Secondary | ICD-10-CM | POA: Insufficient documentation

## 2011-08-16 DIAGNOSIS — M1990 Unspecified osteoarthritis, unspecified site: Secondary | ICD-10-CM

## 2011-08-16 LAB — GLUCOSE, CAPILLARY: Glucose-Capillary: 98 mg/dL (ref 70–99)

## 2011-08-16 MED ORDER — FUROSEMIDE 40 MG PO TABS
40.0000 mg | ORAL_TABLET | Freq: Every day | ORAL | Status: DC
Start: 2011-08-16 — End: 2012-01-23

## 2011-08-16 MED ORDER — HYDROCODONE-ACETAMINOPHEN 5-500 MG PO TABS: ORAL_TABLET | ORAL | Status: DC

## 2011-08-16 NOTE — Patient Instructions (Signed)
Please, go to a Radiology Department as was instructed for a Right hip X-ray. Please, pick up your prescription for Vicodin at your pharmacy. Please, call with any questions. And follow up in 3 months.

## 2011-08-16 NOTE — Progress Notes (Signed)
Copy of pain contract given to pt. 

## 2011-08-17 ENCOUNTER — Telehealth: Payer: Self-pay | Admitting: *Deleted

## 2011-08-17 NOTE — Telephone Encounter (Signed)
Message copied by Mora Bellman on Fri Aug 17, 2011 10:11 AM ------      Message from: Hannah Beat      Created: Thu Aug 16, 2011  1:31 PM       Call pt            Moderate degree of hip arthritis --- i think it is worthwhile trying an injection in her hip joint       Can you schedule a f/u appt at [facility] so we can try u/s guided

## 2011-08-17 NOTE — Telephone Encounter (Signed)
Spoke with pt, gave her x-ray results- scheduled her for appt for hip inj 09/11/11.

## 2011-08-24 NOTE — Progress Notes (Signed)
Patient ID: Renee Watts, female   DOB: 1951/01/19, 61 y.o.   MRN: [id number] Subjective:   Patient ID: Renee Watts female   DOB: 12/17/1950 60 y.o.   MRN: [id number]  HPI: Renee Watts is a 60 y.o.  Woman with a known history of OA. Patient is being seen by an Orthopedic surgeon. Patient is here "for  Refills on her medications." Also requests a new referral for a PT. Denies any other concerns.    Past Medical History  Diagnosis Date  . Hyperlipidemia   . Hypertension   . GERD (gastroesophageal reflux disease)   . Depression   . Arthritis   . Low back pain   . Osteoarthritis, knee     Bilateral, s/p 3 total knee replacement surgerues on right, last 2005. Dr. Shelle Iron  . Pellegrini-Steida syndrome     Myositis ossificans  . Obesity   . Dependent edema     Bilateral  . Diabetes mellitus    Current Outpatient Prescriptions  Medication Sig Dispense Refill  . aspirin 81 MG tablet Take 81 mg by mouth daily.        . furosemide (LASIX) 40 MG tablet Take 1 tablet (40 mg total) by mouth daily.  30 tablet  11  . HYDROcodone-acetaminophen (VICODIN) 5-500 MG per tablet Take one tablet  PO bid PRN  60 tablet  5  . meloxicam (MOBIC) 15 MG tablet Take 1 tablet (15 mg total) by mouth daily.  30 tablet  2  . metFORMIN (GLUCOPHAGE) 1000 MG tablet Take 1 tablet (1,000 mg total) by mouth 2 (two) times daily with a meal.  60 tablet  11  . omeprazole (PRILOSEC) 20 MG capsule Take 20 mg by mouth daily as needed.        . potassium chloride SA (K-DUR,KLOR-CON) 20 MEQ tablet Take 10 mEq by mouth daily.         Family History  Problem Relation Age of Onset  . Diabetes Mother   . Hypertension Mother   . Hypertension Father   . Cancer Father     Lung CA  . Coronary artery disease Father    History   Social History  . Marital Status: Married    Spouse Name: N/A    Number of Children: N/A  . Years of Education: N/A   Social History Main Topics  . Smoking status: Never Smoker     . Smokeless tobacco: None  . Alcohol Use: No  . Drug Use: None  . Sexually Active: None   Other Topics Concern  . None   Social History Narrative   Married, 3 daughters.Works at a Environmental consultant: completed high schoolNikotine: neverETOH: noDrugs: no   Review of Systems: Constitutional: Denies fever, chills, diaphoresis, appetite change and fatigue.  HEENT: Denies photophobia, eye pain, redness, hearing loss, ear pain, congestion, sore throat, rhinorrhea, sneezing, mouth sores, trouble swallowing, neck pain, neck stiffness and tinnitus.   Respiratory: Denies SOB, DOE, cough, chest tightness,  and wheezing.   Cardiovascular: Denies chest pain, palpitations and leg swelling.  Gastrointestinal: Denies nausea, vomiting, abdominal pain, diarrhea, constipation, blood in stool and abdominal distention.  Genitourinary: Denies dysuria, urgency, frequency, hematuria, flank pain and difficulty urinating.  Musculoskeletal: Denies myalgias, back pain, joint swelling, arthralgias and gait problem.  Skin: Denies pallor, rash and wound.  Neurological: Denies dizziness, seizures, syncope, weakness, light-headedness, numbness and headaches.  Hematological: Denies adenopathy. Easy bruising, personal or family bleeding history  Psychiatric/Behavioral: Denies suicidal ideation, mood  changes, confusion, nervousness, sleep disturbance and agitation  Objective:  Physical Exam: Filed Vitals:   08/16/11 1126  BP: 129/80  Pulse: 65  Temp: 97.7 F (36.5 C)  TempSrc: Oral  Height: 5\' 6"  (1.676 m)  Weight: 193 lb 4.8 oz (87.68 kg)   Constitutional: Vital signs reviewed.  Patient is in no acute distress and cooperative with exam. Alert and oriented x3.  Head: Normocephalic and atraumatic Ear: TM normal bilaterally Mouth: no erythema or exudates, MMM Eyes: PERRL, EOMI, conjunctivae normal, No scleral icterus.  Neck: Supple, Trachea midline normal ROM, No JVD, mass, thyromegaly, or  carotid bruit present.  Cardiovascular: RRR, S1 normal, S2 normal, no MRG, pulses symmetric and intact bilaterally Pulmonary/Chest: CTAB, no wheezes, rales, or rhonchi Abdominal: Soft. Non-tender, non-distended, bowel sounds are normal, no masses, organomegaly, or guarding present.  GU: no CVA tenderness Musculoskeletal: No joint deformities, erythema, or stiffness, ROM full and no nontender Hematology: no cervical, inginal, or axillary adenopathy.  Neurological: A&O x3, Strenght is normal and symmetric bilaterally, cranial nerve II-XII are grossly intact, no focal motor deficit, sensory intact to light touch bilaterally.  Skin: Warm, dry and intact. No rash, cyanosis, or clubbing.  Psychiatric: Normal mood and affect. speech and behavior is normal. Judgment and thought content normal. Cognition and memory are normal.   Assessment & Plan:  1. LBP/OA -sees Ortho -continue with meloxicam and vicodin (risks for taking both medications discussed with the patient). -referral to PT  2. HTN -controlled -Rf on Furosemide -B-met today -f/u in 3-4 months or sooner.

## 2011-09-11 ENCOUNTER — Ambulatory Visit (INDEPENDENT_AMBULATORY_CARE_PROVIDER_SITE_OTHER): Payer: Self-pay | Admitting: Family Medicine

## 2011-09-11 ENCOUNTER — Encounter: Payer: Self-pay | Admitting: Family Medicine

## 2011-09-11 DIAGNOSIS — M1990 Unspecified osteoarthritis, unspecified site: Secondary | ICD-10-CM

## 2011-09-11 DIAGNOSIS — M1610 Unilateral primary osteoarthritis, unspecified hip: Secondary | ICD-10-CM

## 2011-09-11 DIAGNOSIS — M1611 Unilateral primary osteoarthritis, right hip: Secondary | ICD-10-CM

## 2011-09-11 NOTE — Progress Notes (Signed)
Procedure Note:  Diagnostic Ultrasound Evalution: [employer] E, MSK ultrasound, MSK probe Anatomy scanned: R hip Indication: Pain / OA Findings: the joint spaces visualized anteriorly under sterile technique. The anterior hip was prepped with Betadine x3, then alcohol. Sterile gel was used and Tegaderm surrounding the ultrasound probe. The needle was visualized entering the joint capsule, and 8 cc of lidocaine 1% and 2 cc of Depo-Medrol 40 mg were injected without difficulty. Post procedure the patient had significantly diminished pain. No complications.  X-rays were reviewed with the patient. RIGHT hip series. There is evidence of moderate osteoarthritic change in the RIGHT hip.

## 2011-11-27 ENCOUNTER — Ambulatory Visit (HOSPITAL_COMMUNITY)
Admission: RE | Admit: 2011-11-27 | Discharge: 2011-11-27 | Disposition: A | Discharge status: Home / Self Care | Payer: Self-pay | Source: Ambulatory Visit | Attending: Internal Medicine | Admitting: Internal Medicine

## 2011-11-27 ENCOUNTER — Encounter: Payer: Self-pay | Admitting: Internal Medicine

## 2011-11-27 ENCOUNTER — Ambulatory Visit (INDEPENDENT_AMBULATORY_CARE_PROVIDER_SITE_OTHER): Payer: Self-pay | Admitting: Internal Medicine

## 2011-11-27 VITALS — BP 124/76 | HR 74 | Temp 98.1°F | Ht 66.0 in | Wt 193.8 lb

## 2011-11-27 DIAGNOSIS — G90511 Complex regional pain syndrome I of right upper limb: Secondary | ICD-10-CM

## 2011-11-27 DIAGNOSIS — M25519 Pain in unspecified shoulder: Secondary | ICD-10-CM | POA: Insufficient documentation

## 2011-11-27 DIAGNOSIS — M1990 Unspecified osteoarthritis, unspecified site: Secondary | ICD-10-CM

## 2011-11-27 DIAGNOSIS — Z79899 Other long term (current) drug therapy: Secondary | ICD-10-CM

## 2011-11-27 DIAGNOSIS — E119 Type 2 diabetes mellitus without complications: Secondary | ICD-10-CM

## 2011-11-27 MED ORDER — GABAPENTIN 100 MG PO CAPS: ORAL_CAPSULE | ORAL | Status: DC

## 2011-11-27 NOTE — Progress Notes (Signed)
Patient ID: Renee Watts, female   DOB: Jun 21, 1951, 61 y.o.   MRN: [id number] HPI:   Patient is here for: 1. Right shoulder pain, described as stiff, worse in am for approximately 40 min- 1 hr. Dull, achy, limits R shoulder ROM. Denies any trauma. Denies any paresthesias and weakness. 2. Dm,type 2. Follows her diet and medication regimen. Denies any side-effects including hypoglycemia. 3. No other concerns  Review of Systems: Negative except per history of present illness  Physical Exam:  Nursing notes and vitals reviewed General:  alert, well-developed, and cooperative to examination.   Lungs:  normal respiratory effort, no accessory muscle use, normal breath sounds, no crackles, and no wheezes. Heart:  normal rate, regular rhythm, no murmurs, no gallop, and no rub.   Abdomen:  soft, non-tender, normal bowel sounds, no distention, no guarding, no rebound tenderness, no hepatomegaly, and no splenomegaly.   Extremities:  No cyanosis, clubbing, edema; right should anterior aspect TTP; no crepitus; decreased ROM on abduction, internal and external rotations. Neurologic:  alert & oriented X3, nonfocal exam  Meds: Medications Prior to Admission  Medication Sig Dispense Refill  . aspirin 81 MG tablet Take 81 mg by mouth daily.        . furosemide (LASIX) 40 MG tablet Take 1 tablet (40 mg total) by mouth daily.  30 tablet  11  . HYDROcodone-acetaminophen (VICODIN) 5-500 MG per tablet Take one tablet  PO bid PRN  60 tablet  5  . meloxicam (MOBIC) 15 MG tablet Take 1 tablet (15 mg total) by mouth daily.  30 tablet  2  . metFORMIN (GLUCOPHAGE) 1000 MG tablet Take 1 tablet (1,000 mg total) by mouth 2 (two) times daily with a meal.  60 tablet  11  . omeprazole (PRILOSEC) 20 MG capsule Take 20 mg by mouth daily as needed.        . potassium chloride SA (K-DUR,KLOR-CON) 20 MEQ tablet Take 10 mEq by mouth daily.         No current facility-administered medications on file as of 11/27/2011.     Allergies: Review of patient's allergies indicates no known allergies. Past Medical History  Diagnosis Date  . Hyperlipidemia   . Hypertension   . GERD (gastroesophageal reflux disease)   . Depression   . Arthritis   . Low back pain   . Osteoarthritis, knee     Bilateral, s/p 3 total knee replacement surgerues on right, last 2005. Dr. Shelle Iron  . Pellegrini-Steida syndrome     Myositis ossificans  . Obesity   . Dependent edema     Bilateral  . Diabetes mellitus    Past Surgical History  Procedure Date  . Tubal ligation   . Abdominal hysterectomy 1984  . Tonsillectomy   . Total knee arthroplasty     with multiple revisions, 2003, 2005, 2007   Family History  Problem Relation Age of Onset  . Diabetes Mother   . Hypertension Mother   . Hypertension Father   . Cancer Father     Lung CA  . Coronary artery disease Father    History   Social History  . Marital Status: Married    Spouse Name: N/A    Number of Children: N/A  . Years of Education: N/A   Occupational History  . Not on file.   Social History Main Topics  . Smoking status: Never Smoker   . Smokeless tobacco: Never Used  . Alcohol Use: No  . Drug Use: Not on  file  . Sexually Active: Not on file   Other Topics Concern  . Not on file   Social History Narrative   Married, 3 daughters.Works at a Environmental consultant: completed high schoolNikotine: neverETOH: noDrugs: no    A/P: 1.Right shoulder pain, acute on chronic, likely OA - XR of right shoulder in a setting of  Worsened pain - consider intraarticular injection - Rf on Mobic  2. DM,type 2 -excellent control. - diet, exercise, foot care reviewed with the patient -S:S of hypo and hyperglycemia discussed -continue with current regimen

## 2011-11-27 NOTE — Patient Instructions (Addendum)
Please, continue with the therapy as instructed. Please, follow up with your annual eye exam and nerve conduction study. Please, call with any questions and follow up in 1-3 months or sooner.

## 2011-11-28 ENCOUNTER — Other Ambulatory Visit: Payer: Self-pay | Admitting: Internal Medicine

## 2011-11-28 DIAGNOSIS — M25551 Pain in right hip: Secondary | ICD-10-CM

## 2011-11-28 MED ORDER — MELOXICAM 15 MG PO TABS: 15.0000 mg | ORAL_TABLET | Freq: Every day | ORAL | Status: DC

## 2012-01-23 ENCOUNTER — Ambulatory Visit (INDEPENDENT_AMBULATORY_CARE_PROVIDER_SITE_OTHER): Payer: Self-pay | Admitting: Internal Medicine

## 2012-01-23 ENCOUNTER — Encounter: Payer: Self-pay | Admitting: Internal Medicine

## 2012-01-23 VITALS — BP 143/84 | HR 79 | Temp 98.4°F | Ht 66.0 in | Wt 196.5 lb

## 2012-01-23 DIAGNOSIS — M25551 Pain in right hip: Secondary | ICD-10-CM

## 2012-01-23 DIAGNOSIS — M1990 Unspecified osteoarthritis, unspecified site: Secondary | ICD-10-CM

## 2012-01-23 DIAGNOSIS — E785 Hyperlipidemia, unspecified: Secondary | ICD-10-CM

## 2012-01-23 DIAGNOSIS — I10 Essential (primary) hypertension: Secondary | ICD-10-CM

## 2012-01-23 DIAGNOSIS — M25559 Pain in unspecified hip: Secondary | ICD-10-CM

## 2012-01-23 DIAGNOSIS — E119 Type 2 diabetes mellitus without complications: Secondary | ICD-10-CM

## 2012-01-23 LAB — BASIC METABOLIC PANEL
CO2: 27 mEq/L (ref 19–32)
Chloride: 107 mEq/L (ref 96–112)
Creat: 0.47 mg/dL — ABNORMAL LOW (ref 0.50–1.10)
Sodium: 142 mEq/L (ref 135–145)

## 2012-01-23 LAB — GLUCOSE, CAPILLARY: Glucose-Capillary: 83 mg/dL (ref 70–99)

## 2012-01-23 LAB — LIPID PANEL
Cholesterol: 160 mg/dL (ref 0–200)
Triglycerides: 164 mg/dL — ABNORMAL HIGH (ref <150)
VLDL: 33 mg/dL (ref 0–40)

## 2012-01-23 MED ORDER — METFORMIN HCL 1000 MG PO TABS: 1000.0000 mg | ORAL_TABLET | Freq: Two times a day (BID) | ORAL | Status: DC

## 2012-01-23 MED ORDER — HYDROCODONE-ACETAMINOPHEN 5-500 MG PO TABS: ORAL_TABLET | ORAL | Status: DC

## 2012-01-23 MED ORDER — PRAVASTATIN SODIUM 40 MG PO TABS: 40.0000 mg | ORAL_TABLET | Freq: Every evening | ORAL | Status: DC

## 2012-01-23 MED ORDER — LISINOPRIL 20 MG PO TABS: 20.0000 mg | ORAL_TABLET | Freq: Every day | ORAL | Status: DC

## 2012-01-23 NOTE — Patient Instructions (Signed)
Please follow up with orthopedic surgery as scheduled. Please start taking pravastatin for your cholesterol. Please start taking Lisinopril for your blood pressure and kidneys.  Please follow up in 3 months.

## 2012-01-23 NOTE — Assessment & Plan Note (Signed)
Lab Results  Component Value Date   NA 142 04/04/2011   K 4.4 04/04/2011   CL 104 04/04/2011   CO2 26 04/04/2011   BUN 12 04/04/2011   CREATININE 0.50 04/04/2011   CREATININE 0.56 12/01/2010    BP Readings from Last 3 Encounters:  01/23/12 143/84  11/27/11 124/76  09/11/11 145/84    Assessment: Hypertension control:  mildly elevated  Progress toward goals:  deteriorated Barriers to meeting goals:  no barriers identified  Plan: Hypertension treatment:  Will stop Lasix since it is not a strong anti-hypertensive, no hx of CHF, or fluid overload. Will restart Lisinopril, as it will also serve as renally protective, considering history of diabetes. Have patient follow up in 2 week for BP recheck. Check BMet today.

## 2012-01-23 NOTE — Assessment & Plan Note (Addendum)
Patient with progressive osteoarthritis of the right hip. Patient already status post knee replacement of the right knee in 2003. Patient's gait abnormality is more consistent with decreased range of motion in the hip joint. History and clinical findings are not consistent with neuropathy, or myopathy. Will refer back to ortho for further evaluation. Given degree of impairment, I am concerned that she may need a hip replacement. Prescription for vicodin has been refilled.   Addendum: Patient cannot afford to see ortho. Currently in process of getting orange card. Once she gets orange card, referral can be made.

## 2012-01-23 NOTE — Progress Notes (Signed)
Subjective:     Patient ID: Renee Watts, female   DOB: [birth year], 61 y.o.   MRN: [id number]  HPI Renee Watts is a 61 year old woman with past medical history significant for type 2 diabetes, hyperlipidemia, hypertension and osteoarthritis of the knees and hip. Patient presents today for a general checkup.  1.) DM - patient reports that she is on metformin for her blood sugar control. She reports that her sugars are well controlled when she does indeed check them. She does not check her blood sugars on a daily basis. She denies any hypoglycemic episodes such as lightheadedness or dizziness.  2.) OA - patient has osteoarthritis status post 2 knee replacements of the right knee in 2003. More recently patient has developed significant right hip pain with an x-ray done in 2012 showing progressive osteoarthritis. Patient went to sports medicine where she had an injection of the right hip. Patient reports that the pain had subsided after the injection however it has returned. Patient reports that she's having significant difficulty with ambulation. She states that she often has to walk with a cane. She also reports that the speed of walking has been reduced significantly. No fevers or chills reported.   Review of Systems  Constitutional: Positive for activity change. Negative for fever, chills, appetite change and fatigue.  Musculoskeletal: Positive for joint swelling, arthralgias and gait problem.  Neurological: Negative for dizziness, speech difficulty, weakness, light-headedness and numbness.       Objective:   Physical Exam  Vitals reviewed. Constitutional: She appears well-developed.  HENT:  Head: Normocephalic and atraumatic.  Neck: Normal range of motion.  Cardiovascular: Normal rate and regular rhythm.   Pulmonary/Chest: Effort normal and breath sounds normal.  Abdominal: Soft.  Musculoskeletal:       Decreased range of motion. Trace edema bilaterally lower extremities Decreased  range of motion when moving the hip joint with internal and external rotation  Neurological: She has normal reflexes. She displays normal reflexes. No cranial nerve deficit. She exhibits normal muscle tone. Coordination normal.       Romberg negative Heel-to-shin normal Slow gait but within normal limits Proprioception intact

## 2012-01-23 NOTE — Assessment & Plan Note (Signed)
Lab Results  Component Value Date   CHOL 202* 04/04/2011   HDL 41 04/04/2011   LDLCALC 161* 04/04/2011   TRIG 149 04/04/2011   CHOLHDL 4.9 04/04/2011   Lipids are above goal considering diabetic. Will restart statin and check lipid panel today.

## 2012-01-23 NOTE — Assessment & Plan Note (Signed)
Lab Results  Component Value Date   HGBA1C 6.5 11/27/2011   HGBA1C  Value: 11.7 (NOTE)                                                                       According to the ADA Clinical Practice Recommendations for 2011, when HbA1c is used as a screening test:   >=6.5%   Diagnostic of Diabetes Mellitus           (if abnormal result  is confirmed)  5.7-6.4%   Increased risk of developing Diabetes Mellitus  References:Diagnosis and Classification of Diabetes Mellitus,Diabetes Care,2011,34(Suppl 1):S62-S69 and Standards of Medical Care in         Diabetes - 2011,Diabetes Care,2011,34  (Suppl 1):S11-S61.* 11/29/2010   CREATININE 0.50 04/04/2011   CREATININE 0.56 12/01/2010   MICROALBUR 1.09 06/04/2011   MICRALBCREAT 21.6 06/04/2011   CHOL 202* 04/04/2011   HDL 41 04/04/2011   TRIG 149 04/04/2011    Last eye exam and foot exam:    Component Value Date/Time   HMDIABEYEEXA normal 11/23/2009   HMDIABFOOTEX normal 04/05/2010    Assessment: Diabetes control: controlled Progress toward goals: at goal Barriers to meeting goals: no barriers identified  Plan: Diabetes treatment: continue current medications Refer to: none Instruction/counseling given: reminded to bring blood glucose meter & log to each visit, reminded to bring medications to each visit and discussed foot care

## 2012-01-24 NOTE — Progress Notes (Signed)
Addended by: Melida Quitter on: 01/24/2012 10:36 AM   Modules accepted: Orders

## 2012-03-10 ENCOUNTER — Encounter: Payer: Self-pay | Admitting: Internal Medicine

## 2012-03-10 ENCOUNTER — Ambulatory Visit (INDEPENDENT_AMBULATORY_CARE_PROVIDER_SITE_OTHER): Payer: Self-pay | Admitting: Internal Medicine

## 2012-03-10 ENCOUNTER — Telehealth: Payer: Self-pay | Admitting: *Deleted

## 2012-03-10 VITALS — BP 121/68 | HR 77 | Temp 97.8°F | Ht 66.0 in | Wt 188.9 lb

## 2012-03-10 DIAGNOSIS — K921 Melena: Secondary | ICD-10-CM

## 2012-03-10 DIAGNOSIS — K625 Hemorrhage of anus and rectum: Secondary | ICD-10-CM

## 2012-03-10 LAB — CBC
HCT: 37.3 % (ref 36.0–46.0)
Hemoglobin: 12.7 g/dL (ref 12.0–15.0)
MCH: 30 pg (ref 26.0–34.0)
MCHC: 34 g/dL (ref 30.0–36.0)
MCV: 88.2 fL (ref 78.0–100.0)

## 2012-03-10 NOTE — Telephone Encounter (Addendum)
Pt called with c/o bright red blood noted when she wipes herself.  None noted in stool.  No history of this.  No constipation, stool is soft. Denies pain. Pt states the only change she notes is starting a new med last week. It's OTC medicine for her joints.  Pt given appointment today for evaluation.

## 2012-03-10 NOTE — Patient Instructions (Addendum)
PLEASE STOP TAKING MELOXICAM and DO NOT TAKE ASPIRIN! PLEASE COME TO THE ER IF YOU HAVE ACTIVE BLEEDING FROM RECTUM. Please schedule a follow up appointment in 1 week for lab check . Please bring your medication bottles with your next appointment. Please take your medicines as prescribed.

## 2012-03-10 NOTE — Progress Notes (Signed)
Subjective:   Patient ID: Renee Watts female   DOB: 07-13-1951 61 y.o.   MRN: [id number]  HPI: [name] is a 61 y.o.  past medical history outlined below presents with episodes of blood in stools.  Patient states that she noticed small amount of blood for the first time when she moved her bowels on Saturday(days prior to the clinic appointment). She also noticed some blood today morning when she decided to call the clinic and she was advised to come for an evaluation. She moves her bowels about 4 times a day and her stools are usually loose she is pretty normal for her. Denies any abominant pain, constipation, weight loss, fever, family history of colon cancer or IBD.  Patient states that she has been having increased pain in her right hip for which although she was prescribed 1 tablet of meloxicam/ a day but she has been taking 2 tablets a day for last week. She also takes a daily aspirin.   Her last colonoscopy was in 2004 when she was found to have non-adenomatous polyps      Past Medical History  Diagnosis Date  . Hyperlipidemia   . Hypertension   . GERD (gastroesophageal reflux disease)   . Depression   . Arthritis   . Low back pain   . Osteoarthritis, knee     Bilateral, s/p 3 total knee replacement surgerues on right, last 2005. Dr. Shelle Iron  . Pellegrini-Steida syndrome     Myositis ossificans  . Obesity   . Dependent edema     Bilateral  . Diabetes mellitus   . KNEE REPLACEMENT, HX OF 06/17/2006    Annotation: Right multiple re-do's 2003 & 2004 Qualifier: Diagnosis of  By: Candis Musa MD, Ruben Reason.   . HYSTERECTOMY, HX OF 06/17/2006    Annotation: Secondary to Fibroids Qualifier: Diagnosis of  By: Candis Musa MD, Virgie Dad, HX OF 06/17/2006    Qualifier: Diagnosis of  By: Candis Musa MD, Ruben Reason.    Current Outpatient Prescriptions  Medication Sig Dispense Refill  . aspirin 81 MG tablet Take 81 mg by mouth daily.        Marland Kitchen gabapentin  (NEURONTIN) 100 MG capsule Please, take one tablet by mouth qhs x3 days, then 3 tablets by mouth qhs  90 capsule  2  . HYDROcodone-acetaminophen (VICODIN) 5-500 MG per tablet Take one tablet  PO bid PRN  60 tablet  3  . lisinopril (PRINIVIL,ZESTRIL) 20 MG tablet Take 1 tablet (20 mg total) by mouth daily.  30 tablet  11  . meloxicam (MOBIC) 15 MG tablet Take 1 tablet (15 mg total) by mouth daily.  30 tablet  11  . metFORMIN (GLUCOPHAGE) 1000 MG tablet Take 1 tablet (1,000 mg total) by mouth 2 (two) times daily with a meal.  60 tablet  11  . omeprazole (PRILOSEC) 20 MG capsule Take 20 mg by mouth daily as needed.        . pravastatin (PRAVACHOL) 40 MG tablet Take 1 tablet (40 mg total) by mouth every evening.  30 tablet  11   Family History  Problem Relation Age of Onset  . Diabetes Mother   . Hypertension Mother   . Hypertension Father   . Cancer Father     Lung CA  . Coronary artery disease Father    History   Social History  . Marital Status: Married    Spouse Name: N/A    Number of Children: N/A  .  Years of Education: N/A   Social History Main Topics  . Smoking status: Never Smoker   . Smokeless tobacco: Never Used  . Alcohol Use: No  . Drug Use: None  . Sexually Active: None   Other Topics Concern  . None   Social History Narrative   Married, 3 daughters.Works at a Environmental consultant: completed high schoolNikotine: neverETOH: noDrugs: no   Review of Systems: Constitutional: Denies fever, chills, diaphoresis, appetite change and fatigue.  HEENT: Denies photophobia, eye pain, redness, hearing loss, ear pain, congestion, sore throat, rhinorrhea, sneezing, mouth sores, trouble swallowing, neck pain, neck stiffness and tinnitus.   Respiratory: Denies SOB, DOE, cough, chest tightness,  and wheezing.   Cardiovascular: Denies chest pain, palpitations and leg swelling.  Gastrointestinal: Denies nausea, vomiting, abdominal pain, diarrhea, constipation, positive  for blood in stool and abdominal distention.  Genitourinary: Denies dysuria, urgency, frequency, hematuria, flank pain and difficulty urinating.  Musculoskeletal: Denies myalgias, back pain, joint swelling, arthralgias and gait problem.  Skin: Denies pallor, rash and wound.  Neurological: Denies dizziness, seizures, syncope, weakness, light-headedness, numbness and headaches.  Hematological: Denies adenopathy. Easy bruising, personal or family bleeding history  Psychiatric/Behavioral: Denies suicidal ideation, mood changes, confusion, nervousness, sleep disturbance and agitation  Objective:  Physical Exam: Filed Vitals:   03/10/12 1331  BP: 121/68  Pulse: 77  Temp: 97.8 F (36.6 C)  TempSrc: Oral  Height: 5\' 6"  (1.676 m)  Weight: 188 lb 14.4 oz (85.684 kg)  SpO2: 98%   Constitutional: Vital signs reviewed.  Patient is a well-developed and well-nourished  in no acute distress and cooperative with exam. Alert and oriented x3.  Head: Normocephalic and atraumatic Ear: TM normal bilaterally Mouth: no erythema or exudates, MMM Eyes: PERRL, EOMI, conjunctivae normal, No scleral icterus.  Neck: Supple, Trachea midline normal ROM, No JVD, mass, thyromegaly, or carotid bruit present.  Cardiovascular: RRR, S1 normal, S2 normal, no MRG, pulses symmetric and intact bilaterally Pulmonary/Chest: CTAB, no wheezes, rales, or rhonchi Abdominal: Soft. Non-tender, non-distended, bowel sounds are normal, no masses, organomegaly, or guarding present.  GU: no CVA tenderness Musculoskeletal: No joint deformities, erythema, or stiffness, ROM full and no nontender Hematology: no cervical, inginal, or axillary adenopathy.  Neurological: A&O x3, Strength is normal and symmetric bilaterally, cranial nerve II-XII are grossly intact, no focal motor deficit, sensory intact to light touch bilaterally.  Skin: Warm, dry and intact. No rash, cyanosis, or clubbing.  Psychiatric: Normal mood and affect. speech and  behavior is normal. Judgment and thought content normal. Cognition and memory are normal.  Rectal exam: normal tone, guaiac positive stools, no external hemorrhoids   Assessment & Plan:

## 2012-03-11 DIAGNOSIS — K921 Melena: Secondary | ICD-10-CM

## 2012-03-11 HISTORY — DX: Melena: K92.1

## 2012-03-11 NOTE — Assessment & Plan Note (Signed)
Patient presents with  couple of episodes of small amount of blood in her stools. On exam she was guaic positive. 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 Other differentials include diverticulosis versus internal hemorrhoids versus AVM versus colon cancer.  Her last colonoscopy report from 2004 was reviewed that showed non-adenomatous polyps. A stat CBC was obtained and her hemoglobin was found to be 12.7( her last Hb was ~ 14 about 1 year ago). Patient was advised to get a colonoscopy to rule out other possible causes of lower GI bleed. Unfortunately, she does not have any insurance or orange card. Patient states that she might be able to get a new insurance in October 2013. She was educated and explained the need to get a repeat colonoscopy at this time. She understands the situation and states that she would schedule an  appointment with our financial counselor Rudell Cobb tomorrow. -She was advised to come to the ER if she has active rectal bleeding or starts feeling dizzy, short of breath. -She was advised not to take any form of NSAID for her pain. -She was also advised not to take aspirin until this episode of bleeding is resolved( for at least 2 weeks for now) -An appointment was scheduled for next week for CBC check.

## 2012-03-17 ENCOUNTER — Other Ambulatory Visit (INDEPENDENT_AMBULATORY_CARE_PROVIDER_SITE_OTHER): Payer: Self-pay

## 2012-03-17 DIAGNOSIS — K625 Hemorrhage of anus and rectum: Secondary | ICD-10-CM

## 2012-03-17 LAB — CBC
MCH: 29.6 pg (ref 26.0–34.0)
MCHC: 34 g/dL (ref 30.0–36.0)
MCV: 87.2 fL (ref 78.0–100.0)
Platelets: 197 10*3/uL (ref 150–400)
RDW: 14.8 % (ref 11.5–15.5)

## 2012-03-18 ENCOUNTER — Telehealth: Payer: Self-pay | Admitting: Internal Medicine

## 2012-03-18 NOTE — Telephone Encounter (Signed)
I called the patient to discuss her lab results.  - She was again advised not to take NSAIDS - Physical therapy for her OA - Can resume her ASA in 1-2 weeks.

## 2012-05-05 ENCOUNTER — Telehealth: Payer: Self-pay | Admitting: *Deleted

## 2012-05-06 NOTE — Telephone Encounter (Signed)
review 

## 2012-05-07 ENCOUNTER — Ambulatory Visit (INDEPENDENT_AMBULATORY_CARE_PROVIDER_SITE_OTHER): Payer: Self-pay | Admitting: Internal Medicine

## 2012-05-07 ENCOUNTER — Encounter: Payer: Self-pay | Admitting: Internal Medicine

## 2012-05-07 VITALS — BP 129/78 | HR 70 | Temp 98.7°F | Wt 191.9 lb

## 2012-05-07 DIAGNOSIS — E119 Type 2 diabetes mellitus without complications: Secondary | ICD-10-CM

## 2012-05-07 DIAGNOSIS — M76899 Other specified enthesopathies of unspecified lower limb, excluding foot: Secondary | ICD-10-CM

## 2012-05-07 DIAGNOSIS — M1990 Unspecified osteoarthritis, unspecified site: Secondary | ICD-10-CM

## 2012-05-07 DIAGNOSIS — M7071 Other bursitis of hip, right hip: Secondary | ICD-10-CM

## 2012-05-07 DIAGNOSIS — Z23 Encounter for immunization: Secondary | ICD-10-CM

## 2012-05-07 DIAGNOSIS — Z79899 Other long term (current) drug therapy: Secondary | ICD-10-CM

## 2012-05-07 LAB — POCT GLYCOSYLATED HEMOGLOBIN (HGB A1C): Hemoglobin A1C: 6.3

## 2012-05-07 MED ORDER — NAPROXEN SODIUM 220 MG PO TABS: 220.0000 mg | ORAL_TABLET | Freq: Two times a day (BID) | ORAL | Status: DC

## 2012-05-07 MED ORDER — HYDROCODONE-ACETAMINOPHEN 5-325 MG PO TABS: 1.0000 | ORAL_TABLET | Freq: Four times a day (QID) | ORAL | Status: DC | PRN

## 2012-05-07 MED ORDER — HYDROCODONE-ACETAMINOPHEN 5-325 MG PO TABS: 1.0000 | ORAL_TABLET | Freq: Two times a day (BID) | ORAL | Status: AC | PRN

## 2012-05-07 NOTE — Patient Instructions (Signed)
1.  Take Aleve 220 mg tablets.  Take 1 tablet twice daily for your hip pain.  Take it only for 5 total days.  2.  You can use Ice

## 2012-05-07 NOTE — Progress Notes (Signed)
Subjective:   Patient ID: Renee Watts female   DOB: 05/20/1951 61 y.o.   MRN: [id number]  HPI: [name] is a 61 y.o. man who presents to clinic today for follow up on his chronic medical conditions as well as complaints about right hip pain.  See Problem focused Assessment and Plan for full details of the status of his chronic medical conditions.   He states that he has had right hip pain for the last week.  She states that the Monday prior to our visit she was unable to walk.  She describes the pain over the anterior thigh that is a jolting, sharp pain.  She states that it is worse when walking or hurts if she lays on it.  She denies fall or trauma to the leg.  She states that she has not had weakness in the leg, numbness, tingling, bowel or bladder incontinence. She has tried an OTC arthritis cream which hasn't helped.    Past Medical History  Diagnosis Date  . Hyperlipidemia   . Hypertension   . GERD (gastroesophageal reflux disease)   . Depression   . Arthritis   . Low back pain   . Osteoarthritis, knee     Bilateral, s/p 3 total knee replacement surgerues on right, last 2005. Dr. Shelle Iron  . Pellegrini-Steida syndrome     Myositis ossificans  . Obesity   . Dependent edema     Bilateral  . Diabetes mellitus   . KNEE REPLACEMENT, HX OF 06/17/2006    Annotation: Right multiple re-do's 2003 & 2004 Qualifier: Diagnosis of  By: Candis Musa MD, Ruben Reason.   . HYSTERECTOMY, HX OF 06/17/2006    Annotation: Secondary to Fibroids Qualifier: Diagnosis of  By: Candis Musa MD, Virgie Dad, HX OF 06/17/2006    Qualifier: Diagnosis of  By: Candis Musa MD, Ruben Reason.    Current Outpatient Prescriptions  Medication Sig Dispense Refill  . gabapentin (NEURONTIN) 100 MG capsule Please, take one tablet by mouth qhs x3 days, then 3 tablets by mouth qhs  90 capsule  2  . HYDROcodone-acetaminophen (NORCO/VICODIN) 5-325 MG per tablet Take 1 tablet by mouth every 6 (six)  hours as needed for pain.  30 tablet  0  . lisinopril (PRINIVIL,ZESTRIL) 20 MG tablet Take 1 tablet (20 mg total) by mouth daily.  30 tablet  11  . metFORMIN (GLUCOPHAGE) 1000 MG tablet Take 1 tablet (1,000 mg total) by mouth 2 (two) times daily with a meal.  60 tablet  11  . naproxen sodium (ALEVE) 220 MG tablet Take 1 tablet (220 mg total) by mouth 2 (two) times daily with a meal.      . omeprazole (PRILOSEC) 20 MG capsule Take 20 mg by mouth daily as needed.        . pravastatin (PRAVACHOL) 40 MG tablet Take 1 tablet (40 mg total) by mouth every evening.  30 tablet  11   Family History  Problem Relation Age of Onset  . Diabetes Mother   . Hypertension Mother   . Hypertension Father   . Cancer Father     Lung CA  . Coronary artery disease Father    History   Social History  . Marital Status: Married    Spouse Name: N/A    Number of Children: N/A  . Years of Education: N/A   Social History Main Topics  . Smoking status: Never Smoker   . Smokeless tobacco: Never Used  . Alcohol Use:  No  . Drug Use: None  . Sexually Active: None   Other Topics Concern  . None   Social History Narrative   Married, 3 daughters.Works at a Environmental consultant: completed high schoolNikotine: neverETOH: noDrugs: no   Review of Systems: Constitutional: Denies fever, chills, diaphoresis, appetite change and fatigue.  HEENT: Denies photophobia, eye pain, redness, hearing loss, ear pain, congestion, sore throat, rhinorrhea, sneezing, mouth sores, trouble swallowing, neck pain, neck stiffness and tinnitus.   Respiratory: Denies SOB, DOE, cough, chest tightness,  and wheezing.   Cardiovascular: Denies chest pain, palpitations and leg swelling.  Gastrointestinal: Denies nausea, vomiting, abdominal pain, diarrhea, constipation, blood in stool and abdominal distention.  Genitourinary: Denies dysuria, urgency, frequency, hematuria, flank pain and difficulty urinating.  Musculoskeletal:  Denies myalgias, back pain, joint swelling, arthralgias and gait problem.  Skin: Denies pallor, rash and wound.  Neurological: Denies dizziness, seizures, syncope, weakness, light-headedness, numbness and headaches.  Hematological: Denies adenopathy. Easy bruising, personal or family bleeding history  Psychiatric/Behavioral: Denies suicidal ideation, mood changes, confusion, nervousness, sleep disturbance and agitation  Objective:  Physical Exam: Filed Vitals:   05/07/12 1045  BP: 129/78  Pulse: 70  Temp: 98.7 F (37.1 C)  TempSrc: Oral  Weight: 191 lb 14.4 oz (87.045 kg)   Constitutional: Vital signs reviewed.  Patient is a well-developed and well-nourished woman in no acute distress and cooperative with exam. Alert and oriented x3.  Head: Normocephalic and atraumatic Ear: TM normal bilaterally Mouth: no erythema or exudates, MMM Eyes: PERRL, EOMI, conjunctivae normal, No scleral icterus.  Neck: Supple, Trachea midline normal ROM, No JVD, mass, thyromegaly, or carotid bruit present.  Cardiovascular: RRR, S1 normal, S2 normal, no MRG, pulses symmetric and intact bilaterally Pulmonary/Chest: CTAB, no wheezes, rales, or rhonchi Abdominal: Soft. Non-tender, non-distended, bowel sounds are normal, no masses, organomegaly, or guarding present.  GU: no CVA tenderness Musculoskeletal: There is moderate tenderness to palpation over the right greater trochanteric bursa.  ROM of the right and left hamstrings is less then 90 degrees.  There is no pain with internal or external rotation of the left hip.  There is mild pain with internal as well as external rotation on the right hip.  Knee ROM is full bilaterally.  SLR negative bilaterally.   Hematology: no cervical, inginal, or axillary adenopathy.  Neurological: A&O x3, Strength is normal and symmetric bilaterally, cranial nerve II-XII are grossly intact, no focal motor deficit, sensory intact to light touch bilaterally.  Skin: Warm, dry and  intact. No rash, cyanosis, or clubbing.  Psychiatric: Normal mood and affect. speech and behavior is normal. Judgment and thought content normal. Cognition and memory are normal.   Assessment & Plan:

## 2012-07-10 ENCOUNTER — Ambulatory Visit (INDEPENDENT_AMBULATORY_CARE_PROVIDER_SITE_OTHER): Payer: Self-pay | Admitting: Internal Medicine

## 2012-07-10 ENCOUNTER — Telehealth: Payer: Self-pay | Admitting: *Deleted

## 2012-07-10 ENCOUNTER — Encounter: Payer: Self-pay | Admitting: Internal Medicine

## 2012-07-10 VITALS — BP 121/71 | HR 72 | Temp 97.0°F | Wt 188.9 lb

## 2012-07-10 DIAGNOSIS — B029 Zoster without complications: Secondary | ICD-10-CM

## 2012-07-10 DIAGNOSIS — M1990 Unspecified osteoarthritis, unspecified site: Secondary | ICD-10-CM

## 2012-07-10 DIAGNOSIS — E119 Type 2 diabetes mellitus without complications: Secondary | ICD-10-CM

## 2012-07-10 DIAGNOSIS — I10 Essential (primary) hypertension: Secondary | ICD-10-CM

## 2012-07-10 HISTORY — DX: Zoster without complications: B02.9

## 2012-07-10 MED ORDER — ACYCLOVIR 200 MG PO CAPS: 800.0000 mg | ORAL_CAPSULE | Freq: Every day | ORAL | Status: DC

## 2012-07-10 MED ORDER — ACETAMINOPHEN 325 MG PO TABS: 650.0000 mg | ORAL_TABLET | Freq: Four times a day (QID) | ORAL | Status: DC | PRN

## 2012-07-10 MED ORDER — DICLOFENAC SODIUM 1 % TD GEL: 2.0000 g | Freq: Four times a day (QID) | TRANSDERMAL | Status: DC

## 2012-07-10 NOTE — Patient Instructions (Addendum)
Please take Acyclovir for the treatment of shingles. Please apply diclofenac gel on your right hip 3 times daily for pain Please take acetaminophen 325 mg 6 hourly as needed for pain We will continue to explore the options for the treatment of your right hip pain with possible referrals to [facility] for further care. Please stop taking the fluid pill, Furosemide. You do not need it now. Please come back to the clinic in 2 weeks for review

## 2012-07-10 NOTE — Assessment & Plan Note (Signed)
Mr Renee Watts has been struggling with pain in her right hip due to osteoarthritis. Unfortunately, she does not have insurance and, therefore, she can't be evaluated by orthopedics at this time. She tried to apply for the orange card, but she did not qualify.  Plan -Have written referral to [facility] as she might be able to see an orthopedic surgeon for her without insurance. -In the meantime, she will start Tylenol and diclofenac gel.

## 2012-07-10 NOTE — Telephone Encounter (Signed)
Pt calls and states she tried a new soap, in fact a body wash- caress body wash- and after using twice she developed a rash on her trunk, itchy, red welts, she has used benadryl cream with little relief, she wishes to be seen. appt per [place]. At 1315 11/14 dr Zada Girt

## 2012-07-10 NOTE — Assessment & Plan Note (Signed)
BP is will controlled on Lisinopril.   Plan  Will continue with Lisinopril  D/c Furosemide

## 2012-07-10 NOTE — Assessment & Plan Note (Addendum)
Clinical history and physical findings are in keeping with acute herpes zoster infection of the right T10-T11 dermatome. There is evidence of new vesicular eruptions.  Plan - Acyclovir 800 mg 5 times daily for 7 days - Acetaminophen  -review in 2 weeks

## 2012-07-10 NOTE — Progress Notes (Signed)
Patient ID: Renee Watts, female   DOB: 09/27/1950, 61 y.o.   MRN: [id number]  Subjective:   Patient ID: Renee Watts female   DOB: 1951-01-02 61 y.o.   MRN: [id number]  HPI: [name] is a 61 y.o. woman with past medical history of diabetes, hyperlipidemia, chronic moderate right hip osteoarthritis, and, hypertension presents to the clinic today with a history of a rash on her abdomen and back for 4 days.  Rash: She reports that rash was abrupt and she woke one morning and noticed that a rash had erupted on the right side of her torso. She reports that she noticed more new lesions popping out yesterday. She does not have fever or chills. She has not been in contact with another person, with a similar rash. She has been experiencing an urge to scratch the rash, but she has not scratched it. She has not used any medications for treating this rash. She experiences pain around the site of the distribution of the rash that his around 6/10 in severity.  Right hip pain: She has continued to experience moderate pain in her right hip which has affected her mobility. She now reports to be using a cane. She has been tried on the naproxen with some relief, but she ran out of it.    She has no other complaints, and she reports to be compliant with her medications. Further history also indicates that she is taking furosemide 20 mg once daily. She denies history of swelling of her lower extremities.    Past Medical History  Diagnosis Date  . Hyperlipidemia   . Hypertension   . GERD (gastroesophageal reflux disease)   . Depression   . Arthritis   . Low back pain   . Osteoarthritis, knee     Bilateral, s/p 3 total knee replacement surgerues on right, last 2005. Dr. Shelle Iron  . Pellegrini-Steida syndrome     Myositis ossificans  . Obesity   . Dependent edema     Bilateral  . Diabetes mellitus   . KNEE REPLACEMENT, HX OF 06/17/2006    Annotation: Right multiple re-do's 2003 & 2004  Qualifier: Diagnosis of  By: Candis Musa MD, Ruben Reason.   . HYSTERECTOMY, HX OF 06/17/2006    Annotation: Secondary to Fibroids Qualifier: Diagnosis of  By: Candis Musa MD, Virgie Dad, HX OF 06/17/2006    Qualifier: Diagnosis of  By: Candis Musa MD, Ruben Reason.    Current Outpatient Prescriptions  Medication Sig Dispense Refill  . furosemide (LASIX) 20 MG tablet Take 20 mg by mouth 2 (two) times daily.      Marland Kitchen lisinopril (PRINIVIL,ZESTRIL) 20 MG tablet Take 1 tablet (20 mg total) by mouth daily.  30 tablet  11  . metFORMIN (GLUCOPHAGE) 1000 MG tablet Take 1 tablet (1,000 mg total) by mouth 2 (two) times daily with a meal.  60 tablet  11  . omeprazole (PRILOSEC) 20 MG capsule Take 20 mg by mouth daily as needed.        . pravastatin (PRAVACHOL) 40 MG tablet Take 1 tablet (40 mg total) by mouth every evening.  30 tablet  11  . acetaminophen (TYLENOL) 325 MG tablet Take 2 tablets (650 mg total) by mouth every 6 (six) hours as needed for pain.  100 tablet  2  . acyclovir (ZOVIRAX) 200 MG capsule Take 4 capsules (800 mg total) by mouth 5 (five) times daily.  140 capsule  0  . diclofenac sodium (VOLTAREN) 1 %  GEL Apply 2 g topically 4 (four) times daily.  1 Tube  2  . gabapentin (NEURONTIN) 100 MG capsule Please, take one tablet by mouth qhs x3 days, then 3 tablets by mouth qhs  90 capsule  2  . naproxen sodium (ALEVE) 220 MG tablet Take 1 tablet (220 mg total) by mouth 2 (two) times daily with a meal.       Family History  Problem Relation Age of Onset  . Diabetes Mother   . Hypertension Mother   . Hypertension Father   . Cancer Father     Lung CA  . Coronary artery disease Father    History   Social History  . Marital Status: Married    Spouse Name: N/A    Number of Children: N/A  . Years of Education: N/A   Social History Main Topics  . Smoking status: Never Smoker   . Smokeless tobacco: Never Used  . Alcohol Use: No  . Drug Use: None  . Sexually Active: None    Other Topics Concern  . None   Social History Narrative   Married, 3 daughters.Works at a Environmental consultant: completed high schoolNikotine: neverETOH: noDrugs: no   Review of Systems: Review of Systems  Constitutional: Negative for fever, chills, weight loss, malaise/fatigue and diaphoresis.  HENT: Negative.  Negative for neck pain.   Eyes: Negative for blurred vision, double vision, photophobia, pain, discharge and redness.  Respiratory: Negative.   Cardiovascular: Negative.   Gastrointestinal: Negative.   Genitourinary: Negative.   Musculoskeletal: Positive for joint pain. Negative for myalgias, back pain and falls.  Skin: Positive for itching and rash.  Neurological: Negative.  Negative for weakness.  Endo/Heme/Allergies: Negative.     Objective:  Physical Exam: Filed Vitals:   07/10/12 1321  BP: 121/71  Pulse: 72  Temp: 97 F (36.1 C)  TempSrc: Oral  Weight: 188 lb 14.4 oz (85.684 kg)  SpO2: 97%  Physical Exam  Constitutional: She is oriented to person, place, and time and well-developed, well-nourished, and in no distress. No distress.  HENT:  Head: Normocephalic and atraumatic.  Eyes: Pupils are equal, round, and reactive to light.  Cardiovascular: Normal rate, regular rhythm, normal heart sounds and intact distal pulses.  Exam reveals no gallop and no friction rub.   No murmur heard. Pulmonary/Chest: Effort normal and breath sounds normal. No respiratory distress. She has no wheezes. She has no rales. She exhibits no tenderness.  Abdominal: Soft. Bowel sounds are normal.  Musculoskeletal: She exhibits tenderness. She exhibits no edema.  Neurological: She is alert and oriented to person, place, and time.  Skin: Rash noted. No purpura noted. Rash is vesicular. Rash is not papular and not nodular. She is not diaphoretic.       She has a vesicular rash in the in right T10 /T11 dermatome. There appears to be some new erupting vesicles.  - See picture         Assessment & Plan:  I have discussed the assessment and plan for the care of this patient with Dr. Rogelia Boga as documented in the problem-based chart.  In brief, the patient will be started on acyclovir for 5 days for the treatment of shingles. I have discontinued her Lasix. I have prescribed for her Tylenol and Diclofenac gel for the treatment of the hip pain. She will be referred to orthopedic service at [facility] as she might be able to get some assistance there. She is still working on her insurance. She  does not qualify for the orange card. She will come back to the clinic in 2 weeks for review.

## 2012-07-29 ENCOUNTER — Encounter: Payer: Self-pay | Admitting: Gastroenterology

## 2012-07-31 ENCOUNTER — Ambulatory Visit: Payer: Self-pay | Admitting: Radiation Oncology

## 2012-08-06 ENCOUNTER — Ambulatory Visit (INDEPENDENT_AMBULATORY_CARE_PROVIDER_SITE_OTHER): Payer: Self-pay | Admitting: Radiation Oncology

## 2012-08-06 ENCOUNTER — Encounter: Payer: Self-pay | Admitting: Radiation Oncology

## 2012-08-06 VITALS — BP 103/66 | HR 65 | Temp 98.0°F | Resp 20 | Ht 66.5 in | Wt 184.3 lb

## 2012-08-06 DIAGNOSIS — E119 Type 2 diabetes mellitus without complications: Secondary | ICD-10-CM

## 2012-08-06 DIAGNOSIS — I10 Essential (primary) hypertension: Secondary | ICD-10-CM

## 2012-08-06 DIAGNOSIS — B029 Zoster without complications: Secondary | ICD-10-CM

## 2012-08-06 DIAGNOSIS — Z79899 Other long term (current) drug therapy: Secondary | ICD-10-CM

## 2012-08-06 DIAGNOSIS — N390 Urinary tract infection, site not specified: Secondary | ICD-10-CM

## 2012-08-06 DIAGNOSIS — M1990 Unspecified osteoarthritis, unspecified site: Secondary | ICD-10-CM

## 2012-08-06 MED ORDER — HYDROCODONE-ACETAMINOPHEN 5-325 MG PO TABS: 1.0000 | ORAL_TABLET | Freq: Four times a day (QID) | ORAL | Status: DC | PRN

## 2012-08-06 NOTE — Assessment & Plan Note (Addendum)
Patient's signs/symptoms are much improved since previous exam on 07/10/2012. Patient complains only of very mild postherpetic neuralgia symptoms. She is taking gabapentin, and is also being prescribed norco today for osteoarthritis. Pt instructed to contact clinic after she receives insurance so that she may be prescribed the herpes zoster vaccination.

## 2012-08-06 NOTE — Assessment & Plan Note (Addendum)
Patient states that her pain in the right hip continues to limit her ambulation, and that Tylenol is minimally effective in reducing her symptoms. In the past, the patient has been prescribed Vicodin for her chronic pain, which appears to have been discontinued earlier this year for clear reasons. As the patient has not required chronic opioid therapy, we will try to avoid this if possible, and she'll be prescribed only a short course of Norco, with referral to sports medicine in 08/2012. If this does not resolve symptoms, however, patient may need to be on opioids long-term(little-to-no concern for narcotic seeking behavior in this patient, particularly as she does not request opioids). -Norco PRN -Continue diclofenac gel -Referral to sports medicine in 08/2012 (after patient obtains insurance) -Followup in 3 months

## 2012-08-06 NOTE — Assessment & Plan Note (Signed)
BP Readings from Last 3 Encounters:  08/06/12 103/66  07/10/12 121/71  05/07/12 129/78    Lab Results  Component Value Date   NA 142 01/23/2012   K 4.1 01/23/2012   CREATININE 0.47* 01/23/2012    Assessment:  Blood pressure control: controlled  Progress toward BP goal:  at goal   Plan:  Medications:  continue current medications

## 2012-08-06 NOTE — Progress Notes (Signed)
  Subjective:    Patient ID: Renee Watts, female    DOB: 1951-03-25, 61 y.o.   MRN: [id number]  HPI Patient is a 61 year old woman with past medical history of herpes zoster, osteoarthritis and chronic right hip pain who presents to clinic for followup of a recent herpes zoster episode. The patient states that she took her acyclovir as prescribed, and that her symptoms are nearly resolved. She complains only of mild discomfort caused by her clothes rubbing against the skin of her right abdomen (the involved shingles area). She states that her rash has very much improved.  The patient has not received a herpes zoster vaccination, but states that she will pursue this when she is able to obtain insurance in 08/2012. The patient complains today of worsening right hip osteoarthritic pain, which she states is not very well controlled with Tylenol. The patient states she would like to be referred to sports medicine for a possible joint injection(which she believes she may have had in the past). Patient also complains of some dysuria yesterday, and also a drop or 2 of blood with urination once yesterday as well. She denies any other complaints   Review of Systems  Constitutional: Negative for fever and chills.  HENT: Negative.   Eyes: Negative.   Respiratory: Negative for shortness of breath and wheezing.   Cardiovascular: Negative for chest pain and leg swelling.  Gastrointestinal: Negative for nausea, vomiting, diarrhea and blood in stool.  Genitourinary: Positive for dysuria and hematuria.  Musculoskeletal: Positive for arthralgias.  Neurological: Negative.   Hematological: Negative.   Psychiatric/Behavioral: Negative.        Objective:   Physical Exam  Constitutional: She is oriented to person, place, and time. She appears well-developed and well-nourished. No distress.  HENT:  Head: Normocephalic and atraumatic.  Mouth/Throat: Oropharynx is clear and moist.  Eyes: Pupils are equal,  round, and reactive to light. No scleral icterus.  Neck: Normal range of motion. Neck supple. No tracheal deviation present. No thyromegaly present.  Cardiovascular: Normal rate and regular rhythm.   No murmur heard. Pulmonary/Chest: Effort normal. She has no wheezes. She has no rales.  Abdominal: Soft. Bowel sounds are normal. She exhibits no distension. There is no tenderness.       Minimal vesiculation of skin overlying R abdomen, much improved from previous exam. No TTP.   Musculoskeletal: Normal range of motion. She exhibits no edema.  Neurological: She is alert and oriented to person, place, and time.  Skin: Skin is warm and dry.  Psychiatric: She has a normal mood and affect. Her behavior is normal.          Assessment & Plan:

## 2012-08-06 NOTE — Patient Instructions (Addendum)
Osteoarthritis Osteoarthritis is the most common form of arthritis. It is redness, soreness, and swelling (inflammation) affecting the cartilage. Cartilage acts as a cushion, covering the ends of bones where they meet to form a joint. CAUSES  Over time, the cartilage begins to wear away. This causes bone to rub on bone. This produces pain and stiffness in the affected joints. Factors that contribute to this problem are:  Excessive body weight.  Age.  Overuse of joints. SYMPTOMS   People with osteoarthritis usually experience joint pain, swelling, or stiffness.  Over time, the joint may lose its normal shape.  Small deposits of bone (osteophytes) may grow on the edges of the joint.  Bits of bone or cartilage can break off and float inside the joint space. This may cause more pain and damage.  Osteoarthritis can lead to depression, anxiety, feelings of helplessness, and limitations on daily activities. The most commonly affected joints are in the:  Ends of the fingers.  Thumbs.  Neck.  Lower back.  Knees.  Hips. DIAGNOSIS  Diagnosis is mostly based on your symptoms and exam. Tests may be helpful, including:  X-rays of the affected joint.  A computerized magnetic scan (MRI).  Blood tests to rule out other types of arthritis.  Joint fluid tests. This involves using a needle to draw fluid from the joint and examining the fluid under a microscope. TREATMENT  Goals of treatment are to control pain, improve joint function, maintain a normal body weight, and maintain a healthy lifestyle. Treatment approaches may include:  A prescribed exercise program with rest and joint relief.  Weight control with nutritional education.  Pain relief techniques such as:  Properly applied heat and cold.  Electric pulses delivered to nerve endings under the skin (transcutaneous electrical nerve stimulation, TENS).  Massage.  Certain supplements. Ask your caregiver before using any  supplements, especially in combination with prescribed drugs.  Medicines to control pain, such as:  Acetaminophen.  Nonsteroidal anti-inflammatory drugs (NSAIDs), such as naproxen.  Narcotic or central-acting agents, such as tramadol. This drug carries a risk of addiction and is generally prescribed for short-term use.  Corticosteroids. These can be given orally or as injection. This is a short-term treatment, not recommended for routine use.  Surgery to reposition the bones and relieve pain (osteotomy) or to remove loose pieces of bone and cartilage. Joint replacement may be needed in advanced states of osteoarthritis. HOME CARE INSTRUCTIONS  Your caregiver can recommend specific types of exercise. These may include:  Strengthening exercises. These are done to strengthen the muscles that support joints affected by arthritis. They can be performed with weights or with exercise bands to add resistance.  Aerobic activities. These are exercises, such as brisk walking or low-impact aerobics, that get your heart pumping. They can help keep your lungs and circulatory system in shape.  Range-of-motion activities. These keep your joints limber.  Balance and agility exercises. These help you maintain daily living skills. Learning about your condition and being actively involved in your care will help improve the course of your osteoarthritis. SEEK MEDICAL CARE IF:   You feel hot or your skin turns red.  You develop a rash in addition to your joint pain.  You have an oral temperature above 102 F (38.9 C). FOR MORE INFORMATION  National Institute of Arthritis and Musculoskeletal and Skin Diseases: www.niams.nih.gov National Institute on Aging: www.nia.nih.gov American College of Rheumatology: www.rheumatology.org Document Released: 08/13/2005 Document Revised: 11/05/2011 Document Reviewed: 11/24/2009 [employer] Patient Information 2013 [employer].    Shingles Shingles is caused by the  same virus that causes chickenpox. The first feelings may be pain or tingling. A rash will follow in a couple days. The rash may occur on any area of the body. Long-lasting pain is more likely in an elderly person. It can last months to years. There are medicines that can help prevent pain if you start taking them early. HOME CARE   Place cool cloths on the rash.  Only take medicine as told by your doctor.  You may use calamine lotion to relieve itchy skin.  Avoid touching:  Babies.  Children with inflamed skin (eczema).  People who have gotten transplanted organs.  People with chronic illnesses, such as leukemia and AIDS.  If the rash is on the face, you may need to see a specialist. Keep all appointments. Shingles must be kept away from the eyes, if possible.  Keep all appointments.  Avoid touching the eyes or eye area, if possible. GET HELP RIGHT AWAY IF:   You have any pain on the face or eye.  Your medicines do not help.  Your redness or puffiness (swelling) spreads.  You have a fever.  You notice any red lines going away from the rash area. MAKE SURE YOU:   Understand these instructions.  Will watch your condition.  Will get help right away if you are not doing well or get worse. Document Released: 01/30/2008 Document Revised: 11/05/2011 Document Reviewed: 01/30/2008 [facility] Patient Information 2013 [place], [place].

## 2012-08-06 NOTE — Assessment & Plan Note (Signed)
Lab Results  Component Value Date   HGBA1C 6.3 08/06/2012   HGBA1C 6.3 05/07/2012   HGBA1C 6.5 11/27/2011     Assessment:  Diabetes control: good control (HgbA1C at goal)  Progress toward A1C goal:  at goal   Plan:  Medications:  continue current medications  Instruction/counseling given: discussed foot care

## 2012-08-07 ENCOUNTER — Telehealth: Payer: Self-pay | Admitting: *Deleted

## 2012-08-07 DIAGNOSIS — N390 Urinary tract infection, site not specified: Secondary | ICD-10-CM | POA: Insufficient documentation

## 2012-08-07 LAB — URINALYSIS, ROUTINE W REFLEX MICROSCOPIC
Glucose, UA: NEGATIVE mg/dL
Protein, ur: 30 mg/dL — AB
Urobilinogen, UA: 0.2 mg/dL (ref 0.0–1.0)

## 2012-08-07 LAB — URINALYSIS, MICROSCOPIC ONLY

## 2012-08-07 MED ORDER — SULFAMETHOXAZOLE-TRIMETHOPRIM 800-160 MG PO TABS: 1.0000 | ORAL_TABLET | Freq: Two times a day (BID) | ORAL | Status: AC

## 2012-08-07 NOTE — Telephone Encounter (Signed)
I'll be sure to call the patient this AM regarding the issue. Thank you.

## 2012-08-07 NOTE — Telephone Encounter (Signed)
Pt calls and states she gave a urine sample 12/11 and had 2 more episodes of "spotting" after leaving the office, 1 last evening, 1 this am at 0300. She is concerned and would like a call from you, thanks, please advise

## 2012-09-01 ENCOUNTER — Ambulatory Visit (INDEPENDENT_AMBULATORY_CARE_PROVIDER_SITE_OTHER): Payer: Self-pay | Admitting: Internal Medicine

## 2012-09-01 ENCOUNTER — Other Ambulatory Visit: Payer: Self-pay | Admitting: Internal Medicine

## 2012-09-01 ENCOUNTER — Encounter: Payer: Self-pay | Admitting: Internal Medicine

## 2012-09-01 VITALS — BP 119/67 | HR 79 | Temp 99.0°F | Ht 66.5 in | Wt 184.8 lb

## 2012-09-01 DIAGNOSIS — I10 Essential (primary) hypertension: Secondary | ICD-10-CM

## 2012-09-01 DIAGNOSIS — Z0000 Encounter for general adult medical examination without abnormal findings: Secondary | ICD-10-CM

## 2012-09-01 DIAGNOSIS — E1142 Type 2 diabetes mellitus with diabetic polyneuropathy: Secondary | ICD-10-CM | POA: Insufficient documentation

## 2012-09-01 DIAGNOSIS — E785 Hyperlipidemia, unspecified: Secondary | ICD-10-CM

## 2012-09-01 DIAGNOSIS — E119 Type 2 diabetes mellitus without complications: Secondary | ICD-10-CM

## 2012-09-01 NOTE — Assessment & Plan Note (Signed)
-  Patient would like to postpone her mammogram and eye examination until she gets her insurance.

## 2012-09-01 NOTE — Assessment & Plan Note (Signed)
Lab Results  Component Value Date   HGBA1C 6.3 08/06/2012   HGBA1C 6.3 05/07/2012   HGBA1C 6.5 11/27/2011     Assessment:  Diabetes control: good control (HgbA1C at goal)  Progress toward A1C goal:  at goal  Comments:   Plan:  Medications:  continue current medications  Home glucose monitoring:   Frequency:     Timing:    Instruction/counseling given: reminded to bring blood glucose meter & log to each visit  Educational resources provided: brochure  Self management tools provided:    Other plans: will continue current regimen

## 2012-09-01 NOTE — Patient Instructions (Signed)
1. You have done great job in taking all your medications. I appreciate it very much. Please continue doing that. 2. Please take all medications as prescribed.  3. If you have worsening of your symptoms or new symptoms arise, please call the clinic ([phone]), or go to the ER immediately if symptoms are severe.     

## 2012-09-01 NOTE — Progress Notes (Signed)
Patient ID: Renee Watts, female   DOB: 02/18/1951, 62 y.o.   MRN: [id number]  Subjective:   Patient ID: Renee Watts female   DOB: 1951-02-03 62 y.o.   MRN: [id number]  HPI: Renee Watts is a 62 y.o. with past medical history as outlined below, who presents for a followup visit.  Regarding her DM-II, she is taking metformin. She does not have symptoms for hypo-or hyperglycemia. Her last A1c was 6.3 at 08/06/12. Patient had peripheral neuropathy. She has mild bilateral lower leg tingling sensations. She supposed to take gabapentin 100 mg 3 times a day, but she reports that she is taking it occasionally since her symptoms is very mild.  Regarding her HTN, she is taking Lasix and lisinopril. Her blood pressure is well controlled. Today blood pressure is normal.   Regarding her HLD, Patient is taking pravastatin 40 mg daily. Her recent LDL was 87 at 01/23/12. Her AST and ALT were normal at 04/04/11. She did not notice any effects,  such as muscle pain.  Denies fever, chills, fatigue, headaches,  cough, chest pain, SOB,  abdominal pain,diarrhea, constipation, dysuria, urgency, frequency or hematuria.     Past Medical History  Diagnosis Date  . Hyperlipidemia   . Hypertension   . GERD (gastroesophageal reflux disease)   . Depression   . Arthritis   . Low back pain   . Osteoarthritis, knee     Bilateral, s/p 3 total knee replacement surgerues on right, last 2005. Dr. Shelle Iron  . Pellegrini-Steida syndrome     Myositis ossificans  . Obesity   . Dependent edema     Bilateral  . Diabetes mellitus   . KNEE REPLACEMENT, HX OF 06/17/2006    Annotation: Right multiple re-do's 2003 & 2004 Qualifier: Diagnosis of  By: Candis Musa MD, Ruben Reason.   . HYSTERECTOMY, HX OF 06/17/2006    Annotation: Secondary to Fibroids Qualifier: Diagnosis of  By: Candis Musa MD, Virgie Dad, HX OF 06/17/2006    Qualifier: Diagnosis of  By: Candis Musa MD, Ruben Reason.    Current Outpatient  Prescriptions  Medication Sig Dispense Refill  . diclofenac sodium (VOLTAREN) 1 % GEL Apply 2 g topically 4 (four) times daily.  1 Tube  2  . furosemide (LASIX) 20 MG tablet Take 20 mg by mouth 2 (two) times daily.      Marland Kitchen gabapentin (NEURONTIN) 100 MG capsule Please, take one tablet by mouth qhs x3 days, then 3 tablets by mouth qhs  90 capsule  2  . HYDROcodone-acetaminophen (NORCO) 5-325 MG per tablet Take 1 tablet by mouth every 6 (six) hours as needed for pain.  60 tablet  0  . lisinopril (PRINIVIL,ZESTRIL) 20 MG tablet Take 1 tablet (20 mg total) by mouth daily.  30 tablet  11  . metFORMIN (GLUCOPHAGE) 1000 MG tablet Take 1 tablet (1,000 mg total) by mouth 2 (two) times daily with a meal.  60 tablet  11  . naproxen sodium (ALEVE) 220 MG tablet Take 1 tablet (220 mg total) by mouth 2 (two) times daily with a meal.      . omeprazole (PRILOSEC) 20 MG capsule Take 20 mg by mouth daily as needed.        . pravastatin (PRAVACHOL) 40 MG tablet Take 1 tablet (40 mg total) by mouth every evening.  30 tablet  11  . acyclovir (ZOVIRAX) 200 MG capsule Take 4 capsules (800 mg total) by mouth 5 (five) times daily.  140 capsule  0   Family History  Problem Relation Age of Onset  . Diabetes Mother   . Hypertension Mother   . Hypertension Father   . Cancer Father     Lung CA  . Coronary artery disease Father    History   Social History  . Marital Status: Married    Spouse Name: N/A    Number of Children: N/A  . Years of Education: N/A   Social History Main Topics  . Smoking status: Never Smoker   . Smokeless tobacco: Never Used  . Alcohol Use: No  . Drug Use: None  . Sexually Active: None   Other Topics Concern  . None   Social History Narrative   Married, 3 daughters.Works at a Environmental consultant: completed high schoolNikotine: neverETOH: noDrugs: no   Review of Systems: As per HPI  Objective:  Physical Exam: Filed Vitals:   09/01/12 0947  BP: 119/67  Pulse: 79    Temp: 99 F (37.2 C)  TempSrc: Oral  Height: 5' 6.5" (1.689 m)  Weight: 184 lb 12.8 oz (83.825 kg)  SpO2: 98%   General: resting in bed, not in acute distress HEENT: PERRL, EOMI, no scleral icterus Cardiac: S1/S2, RRR, No murmurs, gallops or rubs Pulm: Good air movement bilaterally, Clear to auscultation bilaterally, No rales, wheezing, rhonchi or rubs. Abd: Soft,  nondistended, nontender, no rebound pain, no organomegaly, BS present Ext: No rashes or edema, 2+DP/PT pulse bilaterally Musculoskeletal: No joint deformities, erythema, or stiffness, ROM full and nontender Skin: no rashes. No skin bruise. Neuro: alert and oriented X3, cranial nerves II-XII grossly intact, muscle strength 5/5 in all extremeties,  sensation to light touch intact.  Psych.: patient is not psychotic, no suicidal or hemocidal ideation.   Assessment & Plan:

## 2012-09-01 NOTE — Assessment & Plan Note (Signed)
Currently patient is on pravastatin 40 mg daily. She did not notice any side effects, such as muscle pain. Previous LDL was 87 at 01/23/12. Liver function is normal.  We'll continue current regimen.

## 2012-09-01 NOTE — Assessment & Plan Note (Signed)
BP Readings from Last 3 Encounters:  09/01/12 119/67  08/06/12 103/66  07/10/12 121/71    Lab Results  Component Value Date   NA 142 01/23/2012   K 4.1 01/23/2012   CREATININE 0.47* 01/23/2012    Assessment:  Blood pressure control: controlled  Progress toward BP goal:  at goal  Comments:   Plan:  Medications:  continue current medications  Educational resources provided: brochure  Self management tools provided: home blood pressure logbook  Other plans: continue current regimen

## 2012-09-08 NOTE — Assessment & Plan Note (Addendum)
05/07/2012:  She has documented Osteoarthritis in the right hip on X-ray in 2012.  She has been occasionally using her Norco to help with pain.  We will refill it for her today and discuss continuation of her chronic opiate therapy

## 2012-09-08 NOTE — Assessment & Plan Note (Signed)
She has point tenderness over the right greater trochanteric bursa.  We discussed conservative treatment including NSAID therapy as well as ice, heat, and stretching.  We will try Naproxen with the exercises and reassess her pain at her follow up.

## 2012-09-08 NOTE — Assessment & Plan Note (Signed)
Hemoglobin A1C (%)  Date Value  05/07/2012 6.3   11/27/2011 6.5   11/29/2010 11.7 (NOTE)                                       07/18/2010 6.5   03/28/2010 7.0    Her A1C has been well controlled.  We will continue to monitor.  She was encouraged to work on getting her orange card so we can get her over for an eye exam.

## 2012-09-10 ENCOUNTER — Other Ambulatory Visit: Payer: Self-pay

## 2012-09-10 ENCOUNTER — Encounter (HOSPITAL_COMMUNITY): Payer: Self-pay | Admitting: *Deleted

## 2012-09-10 ENCOUNTER — Emergency Department (INDEPENDENT_AMBULATORY_CARE_PROVIDER_SITE_OTHER)
Admission: EM | Admit: 2012-09-10 | Discharge: 2012-09-10 | Disposition: A | Discharge status: Home / Self Care | Payer: Self-pay | Source: Home / Self Care | Attending: Family Medicine | Admitting: Family Medicine

## 2012-09-10 ENCOUNTER — Telehealth: Payer: Self-pay | Admitting: *Deleted

## 2012-09-10 DIAGNOSIS — R1110 Vomiting, unspecified: Secondary | ICD-10-CM

## 2012-09-10 DIAGNOSIS — E860 Dehydration: Secondary | ICD-10-CM

## 2012-09-10 LAB — COMPREHENSIVE METABOLIC PANEL
Alkaline Phosphatase: 75 U/L (ref 39–117)
BUN: 7 mg/dL (ref 6–23)
Creatinine, Ser: 0.37 mg/dL — ABNORMAL LOW (ref 0.50–1.10)
GFR calc Af Amer: >90 mL/min (ref >90)
Glucose, Bld: 151 mg/dL — ABNORMAL HIGH (ref 70–99)
Potassium: 3.8 mEq/L (ref 3.5–5.1)
Total Protein: 8.6 g/dL — ABNORMAL HIGH (ref 6.0–8.3)

## 2012-09-10 LAB — POCT URINALYSIS DIP (DEVICE)
Glucose, UA: NEGATIVE mg/dL
Nitrite: NEGATIVE
Specific Gravity, Urine: >=1.03 (ref 1.005–1.030)
Urobilinogen, UA: 0.2 mg/dL (ref 0.0–1.0)

## 2012-09-10 LAB — POCT I-STAT, CHEM 8
Calcium, Ion: 1.19 mmol/L (ref 1.13–1.30)
Chloride: 104 mEq/L (ref 96–112)
HCT: 44 % (ref 36.0–46.0)
Sodium: 143 mEq/L (ref 135–145)
TCO2: 30 mmol/L (ref 0–100)

## 2012-09-10 LAB — LIPASE, BLOOD: Lipase: 25 U/L (ref 11–59)

## 2012-09-10 MED ORDER — SODIUM CHLORIDE 0.9 % IV BOLUS (SEPSIS)
1000.0000 mL | Freq: Once | INTRAVENOUS | Status: AC
  Administered 2012-09-10: 1000 mL via INTRAVENOUS

## 2012-09-10 MED ORDER — ONDANSETRON HCL 4 MG/2ML IJ SOLN
4.0000 mg | Freq: Once | INTRAMUSCULAR | Status: AC
  Administered 2012-09-10: 4 mg via INTRAVENOUS

## 2012-09-10 MED ORDER — ONDANSETRON 4 MG PO TBDP
ORAL_TABLET | ORAL | Status: AC
  Filled 2012-09-10: qty 1

## 2012-09-10 MED ORDER — OMEPRAZOLE 20 MG PO CPDR: DELAYED_RELEASE_CAPSULE | ORAL | Status: DC

## 2012-09-10 MED ORDER — ONDANSETRON 4 MG PO TBDP: 4.0000 mg | ORAL_TABLET | Freq: Three times a day (TID) | ORAL | Status: DC | PRN

## 2012-09-10 MED ORDER — ONDANSETRON HCL 4 MG/2ML IJ SOLN
INTRAMUSCULAR | Status: AC
  Filled 2012-09-10: qty 2

## 2012-09-10 NOTE — Telephone Encounter (Signed)
Agree, thank you

## 2012-09-10 NOTE — ED Notes (Signed)
Pt given ginger ale for po challenge - no episodes of nausea since iv fluids started and zofran given

## 2012-09-10 NOTE — Telephone Encounter (Signed)
Pt's daughter called stating pt has been having N&V > 24 hrs, nothing helps, eating makes worse, does not if fevers. Has not taken any new meds to help, nothing in routine has changed. No appts available in clinic, referred to urg care

## 2012-09-10 NOTE — ED Provider Notes (Addendum)
History     CSN: [id number]  Arrival date & time 09/10/12  1509   First MD Initiated Contact with Patient 09/10/12 1523      Chief Complaint  Patient presents with  . Nausea  . Emesis    (Consider location/radiation/quality/duration/timing/severity/associated sxs/prior treatment) HPI Comments: 62 year old female with history of diabetes among other comorbidities. Here complaining of nausea vomiting since 5 p.m. last night. Symptoms associated with dizziness, generalized malaise, subjective fever and chills since yesterday. Patient has been trying to drink fluids but has had more than 15 episodes of liquids emesis with no blood since last evening. Also reports diffuse abdominal pain although denies diarrhea. Patient had last bowel movement today normal color and consistency denies straining or constipation. Denies dysuria or hematuria. No known sick contacts with similar symptoms. Denies chest pain or shortness of breath.   Past Medical History  Diagnosis Date  . Hyperlipidemia   . Hypertension   . GERD (gastroesophageal reflux disease)   . Depression   . Arthritis   . Low back pain   . Osteoarthritis, knee     Bilateral, s/p 3 total knee replacement surgerues on right, last 2005. Dr. Shelle Iron  . Pellegrini-Steida syndrome     Myositis ossificans  . Obesity   . Dependent edema     Bilateral  . Diabetes mellitus   . KNEE REPLACEMENT, HX OF 06/17/2006    Annotation: Right multiple re-do's 2003 & 2004 Qualifier: Diagnosis of  By: Candis Musa MD, Ruben Reason.   . HYSTERECTOMY, HX OF 06/17/2006    Annotation: Secondary to Fibroids Qualifier: Diagnosis of  By: Candis Musa MD, Virgie Dad, HX OF 06/17/2006    Qualifier: Diagnosis of  By: Candis Musa MD, Ruben Reason.     Past Surgical History  Procedure Date  . Tubal ligation   . Abdominal hysterectomy 1984  . Tonsillectomy   . Total knee arthroplasty     with multiple revisions, 2003, 2005, 2007    Family History    Problem Relation Age of Onset  . Diabetes Mother   . Hypertension Mother   . Hypertension Father   . Cancer Father     Lung CA  . Coronary artery disease Father     History  Substance Use Topics  . Smoking status: Never Smoker   . Smokeless tobacco: Never Used  . Alcohol Use: No    OB History    Grav Para Term Preterm Abortions TAB SAB Ect Mult Living                  Review of Systems  Constitutional: Positive for chills.  HENT: Positive for congestion. Negative for sore throat, neck pain and neck stiffness.   Respiratory: Negative for chest tightness, shortness of breath and wheezing.   Cardiovascular: Negative for chest pain and leg swelling.  Gastrointestinal: Positive for nausea, vomiting and abdominal pain. Negative for diarrhea.  Genitourinary: Negative for dysuria, urgency, frequency, hematuria, flank pain, vaginal bleeding, vaginal discharge, difficulty urinating and pelvic pain.  Skin: Negative for rash.    Allergies  Review of patient's allergies indicates no known allergies.  Home Medications   Current Outpatient Rx  Name  Route  Sig  Dispense  Refill  . ACYCLOVIR 200 MG PO CAPS   Oral   Take 4 capsules (800 mg total) by mouth 5 (five) times daily.   140 capsule   0   . DICLOFENAC SODIUM 1 % TD GEL   Topical  Apply 2 g topically 4 (four) times daily.   1 Tube   2   . FUROSEMIDE 20 MG PO TABS   Oral   Take 20 mg by mouth 2 (two) times daily.         Marland Kitchen GABAPENTIN 100 MG PO CAPS      TAKE ONE CAPSULE BY MOUTH EVERY NIGHT AT BEDTIME FOR 3 DAYS THEN 3 CAPSULES BY MOUTH EVERY NIGHT AT BEDTIME   90 capsule   0   . HYDROCODONE-ACETAMINOPHEN 5-325 MG PO TABS   Oral   Take 1 tablet by mouth every 6 (six) hours as needed for pain.   60 tablet   0   . LISINOPRIL 20 MG PO TABS   Oral   Take 1 tablet (20 mg total) by mouth daily.   30 tablet   11   . METFORMIN HCL 1000 MG PO TABS   Oral   Take 1 tablet (1,000 mg total) by mouth 2 (two)  times daily with a meal.   60 tablet   11   . NAPROXEN SODIUM 220 MG PO TABS   Oral   Take 1 tablet (220 mg total) by mouth 2 (two) times daily with a meal.         . OMEPRAZOLE 20 MG PO CPDR   Oral   Take 20 mg by mouth daily as needed.           Marland Kitchen OMEPRAZOLE 20 MG PO CPDR      Take twice a day for the next week then daily   30 capsule   0   . ONDANSETRON 4 MG PO TBDP   Oral   Take 1 tablet (4 mg total) by mouth every 8 (eight) hours as needed for nausea.   10 tablet   0   . PRAVASTATIN SODIUM 40 MG PO TABS   Oral   Take 1 tablet (40 mg total) by mouth every evening.   30 tablet   11     BP 148/81  Pulse 111  Temp 99.2 F (37.3 C) (Oral)  Resp 16  SpO2 100%  Physical Exam  Nursing note and vitals reviewed. Constitutional: She is oriented to person, place, and time. No distress.       orthostatic  HENT:  Head: Normocephalic and atraumatic.  Right Ear: External ear normal.  Left Ear: External ear normal.  Mouth/Throat: Oropharynx is clear and moist. No oropharyngeal exudate.       Nasal congestion no rhinorrhea.  Eyes: Conjunctivae normal are normal. Right eye exhibits no discharge. Left eye exhibits no discharge. No scleral icterus.  Neck: Neck supple. No JVD present. No thyromegaly present.  Cardiovascular: Normal rate, regular rhythm and normal heart sounds.  Exam reveals no gallop and no friction rub.   No murmur heard. Pulmonary/Chest: Effort normal and breath sounds normal. No respiratory distress. She has no wheezes. She has no rales. She exhibits no tenderness.  Abdominal: Soft. Bowel sounds are normal. She exhibits no mass.       diffused tenderness with palpation worse at epigastric area with deep palpation. No guarding or rebound.  Lymphadenopathy:    She has no cervical adenopathy.  Neurological: She is alert and oriented to person, place, and time.  Skin: No rash noted. She is not diaphoretic.    ED Course  Procedures (including critical  care time)  Labs Reviewed  COMPREHENSIVE METABOLIC PANEL - Abnormal; Notable for the following:    Glucose, Bld  151 (*)     Creatinine, Ser 0.37 (*)     Total Protein 8.6 (*)     All other components within normal limits  POCT I-STAT, CHEM 8 - Abnormal; Notable for the following:    BUN 5 (*)     Glucose, Bld 149 (*)     All other components within normal limits  POCT URINALYSIS DIP (DEVICE) - Abnormal; Notable for the following:    Bilirubin Urine SMALL (*)     Ketones, ur 40 (*)     Hgb urine dipstick MODERATE (*)     Protein, ur >=300 (*)     Leukocytes, UA SMALL (*)  Biochemical Testing Only. Please order routine urinalysis from main lab if confirmatory testing is needed.   All other components within normal limits  LIPASE, BLOOD  LAB REPORT - SCANNED  URINE CULTURE   No results found.   1. Dehydration   2. Vomiting       MDM  63 year old diabetic female here with nausea vomiting since last evening. Orthostatic with signs of mild to moderate dehydration here. No findings on examination suggestive of acute abdomen. Normal electrolytes and glucose on complete metabolic panel, normal lipase. Patient had moderate blood and small LE in point-of-care urinalysis but no urinary symptoms. Urine was sent for urine with micro- and urine culture. Patient had administered 1 L of normal saline through an IV line. Also ondansetron 4 mg IV x1 was provided here. Patient symptoms significantly improved she was able to tolerate oral fluids by the time of discharge. Prescribed ondansetron and omeprazole. Supportive care and that should prompt her return to medical attention were discussed with patient her daughter and provided in writing. Specifically it was instructed to take patient to the emergency department if new or worsening symptoms like recurrent vomiting or abdominal pain despite following treatment.      Sharin Grave, MD 09/11/12 1035  Addendum note: Called patient home  to see how she was doing this morning talk to her daughter patient reports feeling better, is not longer vomiting, nausea has resolved; she still has some diffuse abdominal discomfort and headache. Denies urinary symptoms. Decided to cover with Keflex for possible UTI (urinalysis with micro-was canceled in the system last night) and urine culture still pending with no preliminary results yet. Patient and daughter agreeable to fill the prescription that was electronically sent to the pharmacy.   Sharin Grave, MD 09/11/12 1043

## 2012-09-10 NOTE — ED Notes (Signed)
Pt reports body aches,vomiting, fever , chills since yesterday

## 2012-09-11 MED ORDER — CEPHALEXIN 500 MG PO CAPS: 500.0000 mg | ORAL_CAPSULE | Freq: Three times a day (TID) | ORAL | Status: DC

## 2012-09-16 ENCOUNTER — Other Ambulatory Visit (HOSPITAL_COMMUNITY): Payer: Self-pay | Admitting: Orthopaedic Surgery

## 2012-09-22 ENCOUNTER — Telehealth: Payer: Self-pay | Admitting: *Deleted

## 2012-09-22 NOTE — Telephone Encounter (Signed)
Pt called to let you know she was seen by Dr Magnus Ivan for hip pain.   Pt was informed she needed a hip replacement and is scheduled for surgery on 2/14. She wanted to let you know.

## 2012-09-22 NOTE — Telephone Encounter (Signed)
Thank you. Renee Watts

## 2012-09-29 ENCOUNTER — Encounter (HOSPITAL_COMMUNITY): Payer: Self-pay | Admitting: Pharmacy Technician

## 2012-10-01 ENCOUNTER — Encounter (HOSPITAL_COMMUNITY)
Admission: RE | Admit: 2012-10-01 | Discharge: 2012-10-01 | Disposition: A | Discharge status: Home / Self Care | Payer: BC Managed Care – PPO | Source: Ambulatory Visit | Attending: Orthopaedic Surgery | Admitting: Orthopaedic Surgery

## 2012-10-01 ENCOUNTER — Encounter (HOSPITAL_COMMUNITY): Payer: Self-pay

## 2012-10-01 ENCOUNTER — Ambulatory Visit (HOSPITAL_COMMUNITY)
Admission: RE | Admit: 2012-10-01 | Discharge: 2012-10-01 | Disposition: A | Discharge status: Home / Self Care | Payer: BC Managed Care – PPO | Source: Ambulatory Visit | Attending: Orthopaedic Surgery | Admitting: Orthopaedic Surgery

## 2012-10-01 DIAGNOSIS — Z01818 Encounter for other preprocedural examination: Secondary | ICD-10-CM | POA: Insufficient documentation

## 2012-10-01 DIAGNOSIS — I10 Essential (primary) hypertension: Secondary | ICD-10-CM | POA: Insufficient documentation

## 2012-10-01 DIAGNOSIS — Z01812 Encounter for preprocedural laboratory examination: Secondary | ICD-10-CM | POA: Insufficient documentation

## 2012-10-01 HISTORY — DX: Zoster without complications: B02.9

## 2012-10-01 LAB — URINALYSIS, ROUTINE W REFLEX MICROSCOPIC
Bilirubin Urine: NEGATIVE
Glucose, UA: NEGATIVE mg/dL
Hgb urine dipstick: NEGATIVE
Ketones, ur: NEGATIVE mg/dL
Protein, ur: NEGATIVE mg/dL

## 2012-10-01 LAB — BASIC METABOLIC PANEL
BUN: 8 mg/dL (ref 6–23)
CO2: 27 mEq/L (ref 19–32)
GFR calc non Af Amer: >90 mL/min (ref >90)
Glucose, Bld: 95 mg/dL (ref 70–99)
Potassium: 3.9 mEq/L (ref 3.5–5.1)

## 2012-10-01 LAB — CBC
Hemoglobin: 12 g/dL (ref 12.0–15.0)
MCH: 29.3 pg (ref 26.0–34.0)
MCHC: 33 g/dL (ref 30.0–36.0)
MCV: 89 fL (ref 78.0–100.0)

## 2012-10-01 LAB — SURGICAL PCR SCREEN
MRSA, PCR: NEGATIVE
Staphylococcus aureus: NEGATIVE

## 2012-10-01 LAB — ABO/RH: ABO/RH(D): O POS

## 2012-10-01 LAB — URINE MICROSCOPIC-ADD ON

## 2012-10-01 NOTE — Patient Instructions (Signed)
YOUR SURGERY IS SCHEDULED AT [facility]  ON:  Friday  2/14  REPORT TO [place] [facility] AT:  9:30 AM      PHONE # FOR SHORT STAY IS [phone]  DO NOT EAT  ANYTHING AFTER MIDNIGHT THE NIGHT BEFORE YOUR SURGERY.  NO FOOD, NO CHEWING GUM, NO MINTS, NO CANDIES, NO CHEWING TOBACCO. YOU MAY HAVE CLEAR LIQUIDS TO DRINK FROM MIDNIGHT UNTIL 6:00 AM DAY OF SURGERY --LIKE WATER, SODA.       NOTHING TO DRINK AFTER 6:OO AM DAY OF SURGERY.  PLEASE TAKE THE FOLLOWING MEDICATIONS THE AM OF YOUR SURGERY WITH A FEW SIPS OF WATER:  OMEPRAZOLE  IF YOU USE INHALERS--USE YOUR INHALERS THE AM OF YOUR SURGERY AND BRING INHALERS TO THE HOSPITAL -TAKE TO SURGERY.    IF YOU ARE DIABETIC:  DO NOT TAKE ANY DIABETIC MEDICATIONS THE AM OF YOUR SURGERY.  IF YOU TAKE INSULIN IN THE EVENINGS--PLEASE ONLY TAKE 1/2 NORMAL EVENING DOSE THE NIGHT BEFORE YOUR SURGERY.  NO INSULIN THE AM OF YOUR SURGERY.  IF YOU HAVE SLEEP APNEA AND USE CPAP OR BIPAP--PLEASE BRING THE MASK AND THE TUBING.  DO NOT BRING YOUR MACHINE.  DO NOT BRING VALUABLES, MONEY, CREDIT CARDS.  DO NOT WEAR JEWELRY, MAKE-UP, NAIL POLISH AND NO METAL PINS OR CLIPS IN YOUR HAIR. CONTACT LENS, DENTURES / PARTIALS, GLASSES SHOULD NOT BE WORN TO SURGERY AND IN MOST CASES-HEARING AIDS WILL NEED TO BE REMOVED.  BRING YOUR GLASSES CASE, ANY EQUIPMENT NEEDED FOR YOUR CONTACT LENS. FOR PATIENTS ADMITTED TO THE HOSPITAL--CHECK OUT TIME THE DAY OF DISCHARGE IS 11:00 AM.  ALL INPATIENT ROOMS ARE PRIVATE - WITH BATHROOM, TELEPHONE, TELEVISION AND WIFI INTERNET.  IF YOU ARE BEING DISCHARGED THE SAME DAY OF YOUR SURGERY--YOU CAN NOT DRIVE YOURSELF HOME--AND SHOULD NOT GO HOME ALONE BY TAXI OR BUS.  NO DRIVING OR OPERATING MACHINERY FOR 24 HOURS FOLLOWING ANESTHESIA / PAIN MEDICATIONS.  PLEASE MAKE ARRANGEMENTS FOR SOMEONE TO BE WITH YOU AT HOME THE FIRST 24 HOURS AFTER SURGERY. RESPONSIBLE DRIVER'S NAME___________________________                    PHONE #   _______________________                               PLEASE READ OVER ANY  FACT SHEETS THAT YOU WERE GIVEN: MRSA INFORMATION, BLOOD TRANSFUSION INFORMATION, INCENTIVE SPIROMETER INFORMATION. FAILURE TO FOLLOW THESE INSTRUCTIONS MAY RESULT IN THE CANCELLATION OF YOUR SURGERY.   PATIENT SIGNATURE_________________________________

## 2012-10-01 NOTE — Pre-Procedure Instructions (Signed)
PREOP CBC, BMET, PT, PTT, UA, T/S AND CXR WERE DONE TODAY AT [facility] AS PER ORDERS DR. Maureen Ralphs AND ANESTHESIOLOGIST'S GUIDELINES.  PT HAS EKG REPORT IN EPIC FROM 09/10/12.

## 2012-10-02 LAB — URINE CULTURE

## 2012-10-02 NOTE — Addendum Note (Signed)
Addended by: Bufford Spikes on: 10/02/2012 10:01 AM   Modules accepted: Orders

## 2012-10-03 NOTE — Pre-Procedure Instructions (Signed)
PT'S PREOP UA AND URINE MICROSCOPIC REPORTS WERE FAXED TO DR. CEliberto Ivory OFFICE ON 10/01/12 -AND SHERRIE AT THE OFFICE NOTIFIED.  URINE CULTURE RESULTS BACK-FAXED REPORT TO DR. Alben Spittle OFFICE TODAY.

## 2012-10-10 ENCOUNTER — Inpatient Hospital Stay (HOSPITAL_COMMUNITY): Payer: BC Managed Care – PPO

## 2012-10-10 ENCOUNTER — Encounter (HOSPITAL_COMMUNITY): Payer: Self-pay | Admitting: *Deleted

## 2012-10-10 ENCOUNTER — Inpatient Hospital Stay (HOSPITAL_COMMUNITY): Payer: BC Managed Care – PPO | Admitting: *Deleted

## 2012-10-10 ENCOUNTER — Inpatient Hospital Stay (HOSPITAL_COMMUNITY)
Admission: RE | Admit: 2012-10-10 | Discharge: 2012-10-13 | DRG: 818 | Disposition: A | Discharge status: Home Health | Payer: BC Managed Care – PPO | Source: Ambulatory Visit | Attending: Orthopaedic Surgery | Admitting: Orthopaedic Surgery

## 2012-10-10 ENCOUNTER — Encounter (HOSPITAL_COMMUNITY)
Admission: RE | Disposition: A | Discharge status: Home Health | Payer: Self-pay | Source: Ambulatory Visit | Attending: Orthopaedic Surgery

## 2012-10-10 DIAGNOSIS — M169 Osteoarthritis of hip, unspecified: Secondary | ICD-10-CM

## 2012-10-10 DIAGNOSIS — D62 Acute posthemorrhagic anemia: Secondary | ICD-10-CM | POA: Diagnosis not present

## 2012-10-10 DIAGNOSIS — E119 Type 2 diabetes mellitus without complications: Secondary | ICD-10-CM | POA: Diagnosis present

## 2012-10-10 DIAGNOSIS — I10 Essential (primary) hypertension: Secondary | ICD-10-CM | POA: Diagnosis present

## 2012-10-10 DIAGNOSIS — M1610 Unilateral primary osteoarthritis, unspecified hip: Principal | ICD-10-CM | POA: Diagnosis present

## 2012-10-10 HISTORY — PX: TOTAL HIP ARTHROPLASTY: SHX124

## 2012-10-10 LAB — GLUCOSE, CAPILLARY
Glucose-Capillary: 87 mg/dL (ref 70–99)
Glucose-Capillary: 204 mg/dL — ABNORMAL HIGH (ref 70–99)
Glucose-Capillary: 140 mg/dL — ABNORMAL HIGH (ref 70–99)

## 2012-10-10 SURGERY — ARTHROPLASTY, HIP, TOTAL, ANTERIOR APPROACH
Anesthesia: General | Site: Hip | Laterality: Right | Wound class: Clean

## 2012-10-10 MED ORDER — METHOCARBAMOL 100 MG/ML IJ SOLN
500.0000 mg | Freq: Four times a day (QID) | INTRAVENOUS | Status: DC | PRN
  Administered 2012-10-10: 500 mg via INTRAVENOUS
  Filled 2012-10-10: qty 5

## 2012-10-10 MED ORDER — CEFAZOLIN SODIUM-DEXTROSE 2-3 GM-% IV SOLR
2.0000 g | INTRAVENOUS | Status: AC
  Administered 2012-10-10: 2 g via INTRAVENOUS

## 2012-10-10 MED ORDER — ROCURONIUM BROMIDE 100 MG/10ML IV SOLN
INTRAVENOUS | Status: DC | PRN
  Administered 2012-10-10: 50 mg via INTRAVENOUS
  Administered 2012-10-10: 10 mg via INTRAVENOUS

## 2012-10-10 MED ORDER — ACETAMINOPHEN 650 MG RE SUPP: 650.0000 mg | Freq: Four times a day (QID) | RECTAL | Status: DC | PRN

## 2012-10-10 MED ORDER — ZOLPIDEM TARTRATE 5 MG PO TABS: 5.0000 mg | ORAL_TABLET | Freq: Every evening | ORAL | Status: DC | PRN

## 2012-10-10 MED ORDER — FUROSEMIDE 20 MG PO TABS
20.0000 mg | ORAL_TABLET | Freq: Every day | ORAL | Status: DC
  Administered 2012-10-11 – 2012-10-13 (×3): 20 mg via ORAL
  Filled 2012-10-10 (×5): qty 1

## 2012-10-10 MED ORDER — LACTATED RINGERS IV SOLN: INTRAVENOUS | Status: DC

## 2012-10-10 MED ORDER — ASPIRIN EC 325 MG PO TBEC
325.0000 mg | DELAYED_RELEASE_TABLET | Freq: Two times a day (BID) | ORAL | Status: DC
  Administered 2012-10-11 – 2012-10-13 (×5): 325 mg via ORAL
  Filled 2012-10-10 (×7): qty 1

## 2012-10-10 MED ORDER — 0.9 % SODIUM CHLORIDE (POUR BTL) OPTIME
TOPICAL | Status: DC | PRN
  Administered 2012-10-10: 1000 mL

## 2012-10-10 MED ORDER — NEOSTIGMINE METHYLSULFATE 1 MG/ML IJ SOLN
INTRAMUSCULAR | Status: DC | PRN
  Administered 2012-10-10: 5 mg via INTRAVENOUS

## 2012-10-10 MED ORDER — HYDROMORPHONE HCL PF 1 MG/ML IJ SOLN
INTRAMUSCULAR | Status: AC
  Administered 2012-10-10: 1 mg via INTRAVENOUS
  Filled 2012-10-10: qty 1

## 2012-10-10 MED ORDER — MENTHOL 3 MG MT LOZG: 1.0000 | LOZENGE | OROMUCOSAL | Status: DC | PRN

## 2012-10-10 MED ORDER — HYDROMORPHONE HCL PF 1 MG/ML IJ SOLN
0.2500 mg | INTRAMUSCULAR | Status: DC | PRN
  Administered 2012-10-10: 0.5 mg via INTRAVENOUS
  Administered 2012-10-10 (×2): 0.25 mg via INTRAVENOUS

## 2012-10-10 MED ORDER — FERROUS SULFATE 325 (65 FE) MG PO TABS
325.0000 mg | ORAL_TABLET | Freq: Three times a day (TID) | ORAL | Status: DC
  Administered 2012-10-10 – 2012-10-13 (×7): 325 mg via ORAL
  Filled 2012-10-10 (×12): qty 1

## 2012-10-10 MED ORDER — MIDAZOLAM HCL 5 MG/5ML IJ SOLN
INTRAMUSCULAR | Status: DC | PRN
Start: 2012-10-10 — End: 2012-10-10
  Administered 2012-10-10: 2 mg via INTRAVENOUS

## 2012-10-10 MED ORDER — ONDANSETRON HCL 4 MG/2ML IJ SOLN: 4.0000 mg | Freq: Four times a day (QID) | INTRAMUSCULAR | Status: DC | PRN

## 2012-10-10 MED ORDER — METOCLOPRAMIDE HCL 10 MG PO TABS: 5.0000 mg | ORAL_TABLET | Freq: Three times a day (TID) | ORAL | Status: DC | PRN

## 2012-10-10 MED ORDER — LACTATED RINGERS IV SOLN
INTRAVENOUS | Status: DC
  Administered 2012-10-10 (×3): via INTRAVENOUS

## 2012-10-10 MED ORDER — PHENOL 1.4 % MT LIQD: 1.0000 | OROMUCOSAL | Status: DC | PRN

## 2012-10-10 MED ORDER — SODIUM CHLORIDE 0.9 % IR SOLN
Status: DC | PRN
  Administered 2012-10-10: 1000 mL

## 2012-10-10 MED ORDER — ONDANSETRON HCL 4 MG/2ML IJ SOLN
INTRAMUSCULAR | Status: DC | PRN
  Administered 2012-10-10: 4 mg via INTRAVENOUS

## 2012-10-10 MED ORDER — LISINOPRIL 20 MG PO TABS
20.0000 mg | ORAL_TABLET | Freq: Every evening | ORAL | Status: DC
  Administered 2012-10-10 – 2012-10-12 (×3): 20 mg via ORAL
  Filled 2012-10-10 (×4): qty 1

## 2012-10-10 MED ORDER — METOCLOPRAMIDE HCL 5 MG/ML IJ SOLN
INTRAMUSCULAR | Status: DC | PRN
  Administered 2012-10-10: 10 mg via INTRAVENOUS

## 2012-10-10 MED ORDER — KETOROLAC TROMETHAMINE 15 MG/ML IJ SOLN
15.0000 mg | Freq: Four times a day (QID) | INTRAMUSCULAR | Status: AC
  Administered 2012-10-10 – 2012-10-11 (×3): 15 mg via INTRAVENOUS
  Filled 2012-10-10 (×3): qty 1

## 2012-10-10 MED ORDER — METHOCARBAMOL 500 MG PO TABS
500.0000 mg | ORAL_TABLET | Freq: Four times a day (QID) | ORAL | Status: DC | PRN
  Administered 2012-10-10 – 2012-10-13 (×3): 500 mg via ORAL
  Filled 2012-10-10 (×3): qty 1

## 2012-10-10 MED ORDER — KETAMINE HCL 50 MG/ML IJ SOLN
INTRAMUSCULAR | Status: DC | PRN
  Administered 2012-10-10 (×2): 2 mg via INTRAMUSCULAR
  Administered 2012-10-10: 2 mg via INTRAVENOUS
  Administered 2012-10-10: 2 mg via INTRAMUSCULAR
  Administered 2012-10-10: 1 mg via INTRAMUSCULAR
  Administered 2012-10-10: 2 mg via INTRAMUSCULAR
  Administered 2012-10-10: 1 mg via INTRAMUSCULAR

## 2012-10-10 MED ORDER — DOCUSATE SODIUM 100 MG PO CAPS
100.0000 mg | ORAL_CAPSULE | Freq: Two times a day (BID) | ORAL | Status: DC
  Administered 2012-10-10 – 2012-10-13 (×7): 100 mg via ORAL
  Filled 2012-10-10: qty 1

## 2012-10-10 MED ORDER — GABAPENTIN 300 MG PO CAPS
300.0000 mg | ORAL_CAPSULE | Freq: Every evening | ORAL | Status: DC
  Administered 2012-10-10 – 2012-10-12 (×3): 300 mg via ORAL
  Filled 2012-10-10 (×4): qty 1

## 2012-10-10 MED ORDER — INSULIN ASPART 100 UNIT/ML ~~LOC~~ SOLN
0.0000 [IU] | Freq: Three times a day (TID) | SUBCUTANEOUS | Status: DC
  Administered 2012-10-10: 5 [IU] via SUBCUTANEOUS

## 2012-10-10 MED ORDER — PANTOPRAZOLE SODIUM 40 MG PO TBEC
40.0000 mg | DELAYED_RELEASE_TABLET | Freq: Every day | ORAL | Status: DC
  Administered 2012-10-11 – 2012-10-13 (×3): 40 mg via ORAL
  Filled 2012-10-10 (×3): qty 1

## 2012-10-10 MED ORDER — CEFAZOLIN SODIUM 1-5 GM-% IV SOLN
1.0000 g | Freq: Four times a day (QID) | INTRAVENOUS | Status: AC
  Administered 2012-10-10 (×2): 1 g via INTRAVENOUS
  Filled 2012-10-10 (×2): qty 50

## 2012-10-10 MED ORDER — PROPOFOL 10 MG/ML IV BOLUS
INTRAVENOUS | Status: DC | PRN
  Administered 2012-10-10: 150 mg via INTRAVENOUS

## 2012-10-10 MED ORDER — OXYCODONE HCL 5 MG PO TABS
5.0000 mg | ORAL_TABLET | ORAL | Status: DC | PRN
  Administered 2012-10-11 – 2012-10-13 (×4): 10 mg via ORAL
  Filled 2012-10-10 (×5): qty 2

## 2012-10-10 MED ORDER — SIMVASTATIN 20 MG PO TABS
20.0000 mg | ORAL_TABLET | Freq: Every day | ORAL | Status: DC
  Administered 2012-10-10 – 2012-10-12 (×3): 20 mg via ORAL
  Filled 2012-10-10 (×4): qty 1

## 2012-10-10 MED ORDER — ACETAMINOPHEN 325 MG PO TABS: 650.0000 mg | ORAL_TABLET | Freq: Four times a day (QID) | ORAL | Status: DC | PRN

## 2012-10-10 MED ORDER — METFORMIN HCL 500 MG PO TABS
1000.0000 mg | ORAL_TABLET | Freq: Two times a day (BID) | ORAL | Status: DC
  Administered 2012-10-10 – 2012-10-13 (×6): 1000 mg via ORAL
  Filled 2012-10-10 (×9): qty 2

## 2012-10-10 MED ORDER — DIPHENHYDRAMINE HCL 12.5 MG/5ML PO ELIX: 12.5000 mg | ORAL_SOLUTION | ORAL | Status: DC | PRN

## 2012-10-10 MED ORDER — ALUM & MAG HYDROXIDE-SIMETH 200-200-20 MG/5ML PO SUSP
30.0000 mL | ORAL | Status: DC | PRN
  Administered 2012-10-12: 30 mL via ORAL
  Filled 2012-10-10: qty 30

## 2012-10-10 MED ORDER — INSULIN ASPART 100 UNIT/ML ~~LOC~~ SOLN: 0.0000 [IU] | Freq: Every day | SUBCUTANEOUS | Status: DC

## 2012-10-10 MED ORDER — ACETAMINOPHEN 10 MG/ML IV SOLN
INTRAVENOUS | Status: DC | PRN
  Administered 2012-10-10: 1000 mg via INTRAVENOUS

## 2012-10-10 MED ORDER — SODIUM CHLORIDE 0.9 % IV SOLN
INTRAVENOUS | Status: DC
  Administered 2012-10-10: 15:00:00 via INTRAVENOUS

## 2012-10-10 MED ORDER — GLYCOPYRROLATE 0.2 MG/ML IJ SOLN
INTRAMUSCULAR | Status: DC | PRN
  Administered 2012-10-10: 0.6 mg via INTRAVENOUS

## 2012-10-10 MED ORDER — FENTANYL CITRATE 0.05 MG/ML IJ SOLN
INTRAMUSCULAR | Status: DC | PRN
  Administered 2012-10-10 (×2): 25 ug via INTRAVENOUS
  Administered 2012-10-10 (×2): 100 ug via INTRAVENOUS
  Administered 2012-10-10 (×2): 50 ug via INTRAVENOUS

## 2012-10-10 MED ORDER — OXYCODONE HCL ER 10 MG PO T12A
10.0000 mg | EXTENDED_RELEASE_TABLET | Freq: Two times a day (BID) | ORAL | Status: DC
  Administered 2012-10-10 – 2012-10-13 (×7): 10 mg via ORAL
  Filled 2012-10-10 (×7): qty 1

## 2012-10-10 MED ORDER — METOCLOPRAMIDE HCL 5 MG/ML IJ SOLN: 5.0000 mg | Freq: Three times a day (TID) | INTRAMUSCULAR | Status: DC | PRN

## 2012-10-10 MED ORDER — ONDANSETRON HCL 4 MG PO TABS: 4.0000 mg | ORAL_TABLET | Freq: Four times a day (QID) | ORAL | Status: DC | PRN

## 2012-10-10 MED ORDER — DEXAMETHASONE SODIUM PHOSPHATE 4 MG/ML IJ SOLN
INTRAMUSCULAR | Status: DC | PRN
  Administered 2012-10-10: 10 mg via INTRAVENOUS

## 2012-10-10 MED ORDER — LABETALOL HCL 5 MG/ML IV SOLN
INTRAVENOUS | Status: DC | PRN
  Administered 2012-10-10 (×2): 5 mg via INTRAVENOUS

## 2012-10-10 MED ORDER — HYDROMORPHONE HCL PF 1 MG/ML IJ SOLN
INTRAMUSCULAR | Status: DC | PRN
  Administered 2012-10-10: 2 mg via INTRAVENOUS

## 2012-10-10 MED ORDER — HYDROMORPHONE HCL PF 1 MG/ML IJ SOLN
1.0000 mg | INTRAMUSCULAR | Status: DC | PRN
  Administered 2012-10-10: 1 mg via INTRAVENOUS
  Filled 2012-10-10: qty 1

## 2012-10-10 SURGICAL SUPPLY — 45 items
ADH SKN CLS APL DERMABOND .7 (GAUZE/BANDAGES/DRESSINGS) ×1
BAG SPEC THK2 15X12 ZIP CLS (MISCELLANEOUS) ×2
BAG ZIPLOCK 12X15 (MISCELLANEOUS) ×4 IMPLANT
BLADE SAW SGTL 18X1.27X75 (BLADE) ×2 IMPLANT
CELLS DAT CNTRL 66122 CELL SVR (MISCELLANEOUS) ×1 IMPLANT
CLOTH BEACON ORANGE TIMEOUT ST (SAFETY) ×2 IMPLANT
DERMABOND ADVANCED (GAUZE/BANDAGES/DRESSINGS) ×1
DERMABOND ADVANCED .7 DNX12 (GAUZE/BANDAGES/DRESSINGS) ×1 IMPLANT
DRAPE C-ARM 42X72 X-RAY (DRAPES) ×2 IMPLANT
DRAPE STERI IOBAN 125X83 (DRAPES) ×2 IMPLANT
DRAPE U-SHAPE 47X51 STRL (DRAPES) ×6 IMPLANT
DRSG AQUACEL AG ADV 3.5X10 (GAUZE/BANDAGES/DRESSINGS) ×2 IMPLANT
DURAPREP 26ML APPLICATOR (WOUND CARE) ×2 IMPLANT
ELECT BLADE TIP CTD 4 INCH (ELECTRODE) ×2 IMPLANT
ELECT REM PT RETURN 9FT ADLT (ELECTROSURGICAL) ×2
ELECTRODE REM PT RTRN 9FT ADLT (ELECTROSURGICAL) ×1 IMPLANT
FACESHIELD LNG OPTICON STERILE (SAFETY) ×8 IMPLANT
GLOVE BIO SURGEON STRL SZ7 (GLOVE) ×2 IMPLANT
GLOVE BIO SURGEON STRL SZ7.5 (GLOVE) ×2 IMPLANT
GLOVE BIOGEL PI IND STRL 7.5 (GLOVE) IMPLANT
GLOVE BIOGEL PI IND STRL 8 (GLOVE) ×1 IMPLANT
GLOVE BIOGEL PI INDICATOR 7.5 (GLOVE) ×1
GLOVE BIOGEL PI INDICATOR 8 (GLOVE) ×1
GLOVE ECLIPSE 7.0 STRL STRAW (GLOVE) ×2 IMPLANT
GLOVE SURG SS PI 7.0 STRL IVOR (GLOVE) ×3 IMPLANT
GOWN STRL REIN XL XLG (GOWN DISPOSABLE) ×6 IMPLANT
HANDPIECE INTERPULSE COAX TIP (DISPOSABLE) ×2
KIT BASIN OR (CUSTOM PROCEDURE TRAY) ×2 IMPLANT
PACK TOTAL JOINT (CUSTOM PROCEDURE TRAY) ×2 IMPLANT
PADDING CAST COTTON 6X4 STRL (CAST SUPPLIES) ×2 IMPLANT
RETRACTOR WND ALEXIS 18 MED (MISCELLANEOUS) ×1 IMPLANT
RTRCTR WOUND ALEXIS 18CM MED (MISCELLANEOUS) ×2
SET HNDPC FAN SPRY TIP SCT (DISPOSABLE) ×1 IMPLANT
SUT ETHIBOND NAB CT1 #1 30IN (SUTURE) ×3 IMPLANT
SUT MNCRL AB 4-0 PS2 18 (SUTURE) ×2 IMPLANT
SUT VIC AB 0 CT1 27 (SUTURE) ×2
SUT VIC AB 0 CT1 27XBRD ANTBC (SUTURE) IMPLANT
SUT VIC AB 1 CT1 36 (SUTURE) ×3 IMPLANT
SUT VIC AB 2-0 CT1 27 (SUTURE) ×4
SUT VIC AB 2-0 CT1 TAPERPNT 27 (SUTURE) ×2 IMPLANT
SUT VLOC 180 0 24IN GS25 (SUTURE) ×2 IMPLANT
TOWEL OR 17X26 10 PK STRL BLUE (TOWEL DISPOSABLE) ×4 IMPLANT
TOWEL OR NON WOVEN STRL DISP B (DISPOSABLE) ×2 IMPLANT
TRAY FOLEY CATH 14FRSI W/METER (CATHETERS) ×2 IMPLANT
WATER STERILE IRR 1500ML POUR (IV SOLUTION) ×2 IMPLANT

## 2012-10-10 NOTE — Care Management Note (Signed)
  Page 1 of 1   10/10/2012     4:33:10 PM   CARE MANAGEMENT NOTE 10/10/2012  Patient:  Renee Watts, Renee Watts   Account Number:  [id number]  Date Initiated:  10/10/2012  Documentation initiated by:  Colleen Can  Subjective/Objective Assessment:   dx total rt hip replacemnt-anterior approach 10/10/2012     Action/Plan:   PT is pending- CM will follow after pt/ot eval completed.   Anticipated DC Date:  10/13/2012   Anticipated DC Plan:           Choice offered to / List presented to:             Status of service:  In process, will continue to follow Medicare Important Message given?   (If response is "NO", the following Medicare IM given date fields will be blank) Date Medicare IM given:   Date Additional Medicare IM given:    Discharge Disposition:    Per UR Regulation:  Reviewed for med. necessity/level of care/duration of stay  If discussed at Long Length of Stay Meetings, dates discussed:    Comments:

## 2012-10-10 NOTE — Progress Notes (Signed)
PT NOTE:  PT deferred POD 0 at request of RN 2* pt elevated pain level.  Will follow in am.

## 2012-10-10 NOTE — H&P (Signed)
TOTAL HIP ADMISSION H&P  Patient is admitted for right total hip arthroplasty.  Subjective:  Chief Complaint: right hip pain  HPI: Renee Watts, 62 y.o. female, has a history of pain and functional disability in the right hip(s) due to arthritis and patient has failed non-surgical conservative treatments for greater than 12 weeks to include activity modifaction and weight loss.  Onset of symptoms was gradual starting 6 years ago with gradually worsening course since that time.The patient noted no past surgery on the right hip(s).  Patient currently rates pain in the right hip at 10 out of 10 with activity. Patient has night pain, worsening of pain with activity and weight bearing, trendelenberg gait, pain that interfers with activities of daily living and pain with passive range of motion. Patient has evidence of subchondral cysts, subchondral sclerosis, periarticular osteophytes and joint space narrowing by imaging studies. This condition presents safety issues increasing the risk of falls.  There is no current active infection.  Patient Active Problem List   Diagnosis Date Noted  . Degenerative arthritis of hip 10/10/2012  . Diabetic peripheral neuropathy 09/01/2012  . Health care maintenance 09/01/2012  . Shingles rash 07/10/2012  . Bursitis of hip, right 05/07/2012  . Blood in stool 03/11/2012  . Pain, joint, pelvic region or thigh, right 07/24/2011  . HYPERLIPIDEMIA 09/16/2006  . HYPERTENSION 09/16/2006  . GERD 09/16/2006  . DIABETES MELLITUS, TYPE II 06/17/2006  . OSTEOARTHRITIS 06/17/2006  . DEPENDENT EDEMA, LEGS, BILATERAL 06/17/2006   Past Medical History  Diagnosis Date  . Hyperlipidemia   . Hypertension   . GERD (gastroesophageal reflux disease)   . Depression   . Arthritis   . Low back pain   . Osteoarthritis, knee     Bilateral, s/p 3 total knee replacement surgerues on right, last 2005. Dr. Shelle Iron  . Pellegrini-Steida syndrome     Myositis ossificans  . Obesity    . Dependent edema     Bilateral  . KNEE REPLACEMENT, HX OF 06/17/2006    Annotation: Right multiple re-do's 2003 & 2004 Qualifier: Diagnosis of  By: Candis Musa MD, Ruben Reason.   . HYSTERECTOMY, HX OF 06/17/2006    Annotation: Secondary to Fibroids Qualifier: Diagnosis of  By: Candis Musa MD, Ruben Reason TONSILLECTOMY, HX OF 06/17/2006    Qualifier: Diagnosis of  By: Candis Musa MD, Veronique D.   . Diabetes mellitus     ORAL MEDICATION  . Shingles Jul 10 2012    LESIONS CLEARED BUT NOT TOLERATING ANY THING TIGHT AGAINST BODY--SHINGLES WERE AROSS BACK AND ABDOMEN    Past Surgical History  Procedure Laterality Date  . Tubal ligation    . Abdominal hysterectomy  1984  . Tonsillectomy    . Total knee arthroplasty      with multiple revisions, 2003, 2005, 2007  . Joint replacement      No prescriptions prior to admission   No Known Allergies  History  Substance Use Topics  . Smoking status: Never Smoker   . Smokeless tobacco: Never Used  . Alcohol Use: No    Family History  Problem Relation Age of Onset  . Diabetes Mother   . Hypertension Mother   . Hypertension Father   . Cancer Father     Lung CA  . Coronary artery disease Father      Review of Systems  Musculoskeletal: Positive for joint pain.  All other systems reviewed and are negative.    Objective:  Physical Exam  Constitutional: She  is oriented to person, place, and time. She appears well-developed and well-nourished.  HENT:  Head: Normocephalic and atraumatic.  Eyes: EOM are normal. Pupils are equal, round, and reactive to light.  Neck: Normal range of motion. Neck supple.  Cardiovascular: Normal rate and regular rhythm.   Respiratory: Effort normal and breath sounds normal.  GI: Soft. Bowel sounds are normal.  Musculoskeletal:       Right hip: She exhibits decreased range of motion, decreased strength, bony tenderness and crepitus.  Neurological: She is alert and oriented to person, place, and time.   Skin: Skin is warm and dry.  Psychiatric: She has a normal mood and affect.    Vital signs in last 24 hours:    Labs:   Estimated body mass index is 29.38 kg/(m^2) as calculated from the following:   Height as of 09/01/12: 5' 6.5" (1.689 m).   Weight as of 09/01/12: 83.825 kg (184 lb 12.8 oz).   Imaging Review Plain radiographs demonstrate severe degenerative joint disease of the right hip(s). The bone quality appears to be good for age and reported activity level.  Assessment/Plan:  End stage arthritis, right hip(s)  The patient history, physical examination, clinical judgement of the provider and imaging studies are consistent with end stage degenerative joint disease of the right hip(s) and total hip arthroplasty is deemed medically necessary. The treatment options including medical management, injection therapy, arthroscopy and arthroplasty were discussed at length. The risks and benefits of total hip arthroplasty were presented and reviewed. The risks due to aseptic loosening, infection, stiffness, dislocation/subluxation,  thromboembolic complications and other imponderables were discussed.  The patient acknowledged the explanation, agreed to proceed with the plan and consent was signed. Patient is being admitted for inpatient treatment for surgery, pain control, PT, OT, prophylactic antibiotics, VTE prophylaxis, progressive ambulation and ADL's and discharge planning.The patient is planning to be discharged home with home health services

## 2012-10-10 NOTE — Transfer of Care (Signed)
Immediate Anesthesia Transfer of Care Note  Patient: Renee Watts  Procedure(s) Performed: Procedure(s) with comments: TOTAL HIP ARTHROPLASTY ANTERIOR APPROACH (Right) - Right Total Hip Arthroplasty, Anterior Approach (C-Arm)  Patient Location: PACU  Anesthesia Type:General  Level of Consciousness: awake, patient cooperative and responds to stimulation, drowsey, ventilating well  Airway & Oxygen Therapy: Patient Spontanous Breathing and Patient connected to face mask oxygen  Post-op Assessment: Report given to PACU RN, Post -op Vital signs reviewed and stable and Patient moving all extremities X 4  Post vital signs: Reviewed and stable  Complications: No apparent anesthesia complications

## 2012-10-10 NOTE — Preoperative (Signed)
Beta Blockers   Reason not to administer Beta Blockers:Not Applicable, not on home BB 

## 2012-10-10 NOTE — Anesthesia Postprocedure Evaluation (Signed)
  Anesthesia Post-op Note  Patient: Renee Watts  Procedure(s) Performed: Procedure(s) (LRB): TOTAL HIP ARTHROPLASTY ANTERIOR APPROACH (Right)  Patient Location: PACU  Anesthesia Type: General  Level of Consciousness: awake and alert   Airway and Oxygen Therapy: Patient Spontanous Breathing  Post-op Pain: mild  Post-op Assessment: Post-op Vital signs reviewed, Patient's Cardiovascular Status Stable, Respiratory Function Stable, Patent Airway and No signs of Nausea or vomiting  Last Vitals:  Filed Vitals:   10/10/12 1410  BP: 156/72  Pulse:   Temp: 37.3 C  Resp: 14    Post-op Vital Signs: stable   Complications: No apparent anesthesia complications

## 2012-10-10 NOTE — Op Note (Signed)
NAMETAMZIN, BERTLING NO.:  [id number]  MEDICAL RECORD NO.:  [id number]  LOCATION:  1614                         FACILITY:  [facility]  PHYSICIAN:  Vanita Panda. Magnus Ivan, M.D.DATE OF BIRTH:  07/20/1951  DATE OF PROCEDURE:  10/10/2012 DATE OF DISCHARGE:                              OPERATIVE REPORT   PREOPERATIVE DIAGNOSIS:  Severe end-stage arthritis, right hip.  POSTOPERATIVE DIAGNOSIS:  Severe end-stage arthritis, right hip.  PROCEDURE:  Right total hip arthroplasty through direct anterior approach.  IMPLANTS:  DePuy Sector Gription acetabular component size 50 with an apex hole eliminator guide at and 1 screw, size 32+ 4 neutral polyethylene liner, size 10 Corail femoral component with varus offset (KLA), size 32+ 1 ceramic hip ball.  SURGEON:  Kathryne Hitch, MD.  ASSISTANT:  Maud Deed, PA-C.  ANESTHESIA:  General.  BLOOD LOSS:  Less than 500 mL.  ANTIBIOTICS:  IV Ancef 2 g.  COMPLICATIONS:  None.  INDICATIONS:  This patient is a 62 year old female, who has severe end- stage arthritis of her right hip.  This has been verified with x-rays. Her daily activities are severely diminished because of her severe pain. At this point, she wished to proceed with a total hip arthroplasty.  The risks and benefits of this have been explained to her.  A well understood and documented in the history and physical exam.  She does wish to proceed.  PROCEDURE DESCRIPTION:  After informed consent was obtained, appropriate right hip was marked.  She was brought to the operating room.  General anesthesia was obtained while she was on her stretcher.  A Foley catheter was placed and then traction boots were placed on both her feet.  She was then placed supine on the Hana fracture table with the perineal post in place and both legs inline skeletal traction, but no traction applied.  We then assessed her right and left hips under direct fluoroscopy and gained  her pelvis center for assessing leg lengths.  We then prepped the right hip with DuraPrep and sterile drape.  A time-out was called to identify the correct patient, correct right hip.  I then made an incision just anterior posterior to the anterior superior iliac spine and carried this down to the tensor fascia lata.  I then divided the tissue just posterior to extensor fascia lata and did get into the IT band and once I recognized this, I closed that small incision, and then advanced my deep incision of the tensor fascia lata anteriorly and made a new deep incision.  Again this was all deep and not the superficial skin incision which was a single incision.  I then took a plane between the tensor fascia lata in the sartorius and was able to proceed with a direct anterior approach to the hip.  A Cobra retractor was placed around the lateral neck and one up underneath the rectus femoris, underneath the medial neck, I cauterized the lateral femoral circumflex vessels.  I then opened the hip capsule.  A final large joint effusion.  I then placed a Cobra retractor within the hip capsule.  Made my femoral neck cut just proximal to the lesser trochanter, with an oscillating saw  and completed this with an osteotome.  I then removed the femoral head in its entirety and found it to be significantly soft and devoid of cartilage and just crumbling away.  Next, I placed a bent Hohmann medially and a Cobra retractor laterally. I cleaned the acetabular debris and began reaming from a size 42 reamer in 2 mm increments up to size 50 with all reamers placed under direct visualization.  The last reamer also placed under direct fluoroscopy. Once, I felt comfort with of reaming my inclination anteversion.  I placed the real DePuy sector acetabular component size 50, I placed the apex hole eliminator guide we drilled for a single screw just fracture superior and this was easily done.  I then placed the real +4  neutral polyethylene liner.  Attention was then turned to the femur.  We externally rotated the leg to 90 degrees, extended and adducted.  I placed a Mueller retractor medially and a bent Hohmann behind the greater trochanter.  I released lateral joint capsule on the piriformis and delivered the leg a little more.  I then used a box of cutting guide to open the femoral canal and then broached from a size 8 broached up to a size 10, we used a calcar planer of size 10 and then trialed a standard neck, followed by a varus neck to short her up a little bit tight in her laterally.  I then trialed a 32+ 1 hip bone reduces in the acetabulum.  I did not feel like she was just slightly longer on that side, but stability was important.  It was definitely stable with internal and external rotation.  I then removed the trial components, dislocated the hip and placed a real size 10 Corail KLA femoral component and the real 32+ 1 ceramic hip ball.  We reduced this back in the acetabulum, it was stable.  We used pulsatile lavage to copiously irrigated the tissues.  I closed the capsule with #1 Ethibond suture followed by a running #0 V-Loc suture in the tensor fascia, #0 Vicryl deep, 2-0 Vicryl subcutaneous, and 4-0 Monocryl subcuticular stitch, Dermabond, and Aquacel dressing.  She was then taken off the Hana table, awakened, extubated, and taken to recovery room in stable condition. All final counts correct.  There were no complications noted.  Of note, Maud Deed, [facility], was present and assisted the entire case, and her assistance was crucial and the exposure and placement of implants and then closure of the wound.     Vanita Panda. Magnus Ivan, M.D.     CYB/MEDQ  D:  10/10/2012  T:  10/10/2012  Job:  [id number]

## 2012-10-10 NOTE — Brief Op Note (Signed)
10/10/2012  12:40 PM  PATIENT:  Lenetta Quaker  62 y.o. female  PRE-OPERATIVE DIAGNOSIS:  Severe osteoarthritis right hip  POST-OPERATIVE DIAGNOSIS:  Severe osteoarthritis right hip  PROCEDURE:  Procedure(s) with comments: TOTAL HIP ARTHROPLASTY ANTERIOR APPROACH (Right) - Right Total Hip Arthroplasty, Anterior Approach (C-Arm)  SURGEON:  Surgeon(s) and Role:    * Kathryne Hitch, MD - Primary  PHYSICIAN ASSISTANT:   ASSISTANTS: Maud Deed, PA-C   ANESTHESIA:   general  EBL:  Total I/O In: 2000 [I.V.:2000] Out: 950 [Urine:450; Blood:500]  BLOOD ADMINISTERED:none  DRAINS: none   LOCAL MEDICATIONS USED:  NONE  SPECIMEN:  No Specimen  DISPOSITION OF SPECIMEN:  N/A  COUNTS:  YES  TOURNIQUET:  * No tourniquets in log *  DICTATION: .Other Dictation: Dictation Number [id number]  PLAN OF CARE: Admit to inpatient   PATIENT DISPOSITION:  PACU - hemodynamically stable.   Delay start of Pharmacological VTE agent (>24hrs) due to surgical blood loss or risk of bleeding: no

## 2012-10-10 NOTE — Anesthesia Preprocedure Evaluation (Signed)
Anesthesia Evaluation  Patient identified by MRN, date of birth, ID band Patient awake    Reviewed: Allergy & Precautions, H&P , NPO status , Patient's Chart, lab work & pertinent test results  Airway Mallampati: II TM Distance: >3 FB Neck ROM: full    Dental no notable dental hx. (+) Teeth Intact and Dental Advisory Given   Pulmonary neg pulmonary ROS,  breath sounds clear to auscultation  Pulmonary exam normal       Cardiovascular hypertension, Pt. on medications Rhythm:regular Rate:Normal     Neuro/Psych negative neurological ROS  negative psych ROS   GI/Hepatic negative GI ROS, Neg liver ROS,   Endo/Other  diabetes, Well Controlled, Type 2, Oral Hypoglycemic Agents  Renal/GU negative Renal ROS  negative genitourinary   Musculoskeletal   Abdominal   Peds  Hematology negative hematology ROS (+)   Anesthesia Other Findings   Reproductive/Obstetrics negative OB ROS                           Anesthesia Physical Anesthesia Plan  ASA: III  Anesthesia Plan: General   Post-op Pain Management:    Induction:   Airway Management Planned:   Additional Equipment:   Intra-op Plan:   Post-operative Plan:   Informed Consent: I have reviewed the patients History and Physical, chart, labs and discussed the procedure including the risks, benefits and alternatives for the proposed anesthesia with the patient or authorized representative who has indicated his/her understanding and acceptance.   Dental Advisory Given  Plan Discussed with: CRNA  Anesthesia Plan Comments:         Anesthesia Quick Evaluation

## 2012-10-11 LAB — GLUCOSE, CAPILLARY
Glucose-Capillary: 108 mg/dL — ABNORMAL HIGH (ref 70–99)
Glucose-Capillary: 118 mg/dL — ABNORMAL HIGH (ref 70–99)

## 2012-10-11 LAB — BASIC METABOLIC PANEL
BUN: 6 mg/dL (ref 6–23)
Chloride: 106 mEq/L (ref 96–112)
GFR calc Af Amer: >90 mL/min (ref >90)
GFR calc non Af Amer: >90 mL/min (ref >90)
Potassium: 3.9 mEq/L (ref 3.5–5.1)

## 2012-10-11 LAB — CBC
HCT: 30 % — ABNORMAL LOW (ref 36.0–46.0)
MCHC: 34 g/dL (ref 30.0–36.0)
Platelets: 164 10*3/uL (ref 150–400)
RDW: 13.3 % (ref 11.5–15.5)
WBC: 9.1 10*3/uL (ref 4.0–10.5)

## 2012-10-11 NOTE — Evaluation (Signed)
Occupational Therapy Evaluation Patient Details Name: Renee Watts MRN: [id number] DOB: 06-08-1951 Today's Date: 10/11/2012 Time: 4098-1191 OT Time Calculation (min): 36 min  OT Assessment / Plan / Recommendation Clinical Impression  This 62 y.o. female admitted for Rt. THA (direct anterior approach).  Pt. demonstrates the below listed deficits and will benefit from OT to maximize safety and independence with BADLs to allow her to return home with supervision from family    OT Assessment  Patient needs continued OT Services    Follow Up Recommendations  No OT follow up;Supervision - Intermittent    Barriers to Discharge None    Equipment Recommendations  3 in 1 bedside comode    Recommendations for Other Services    Frequency  Min 2X/week    Precautions / Restrictions Precautions Precautions: None Restrictions Weight Bearing Restrictions: No Other Position/Activity Restrictions: WBAT R LE       ADL  Eating/Feeding: Independent Where Assessed - Eating/Feeding: Bed level Grooming: Wash/dry hands;Wash/dry face;Teeth care;Minimal assistance Where Assessed - Grooming: Supported standing Upper Body Bathing: Set up Where Assessed - Upper Body Bathing: Supported sitting Lower Body Bathing: Moderate assistance Where Assessed - Lower Body Bathing: Supported sit to stand Upper Body Dressing: Set up Where Assessed - Upper Body Dressing: Unsupported sitting Lower Body Dressing: Moderate assistance Where Assessed - Lower Body Dressing: Supported sit to Pharmacist, hospital: Moderate assistance Toilet Transfer Method: Sit to stand Toilet Transfer Equipment: Comfort height toilet;Grab bars Toileting - Clothing Manipulation and Hygiene: Minimal assistance Where Assessed - Glass blower/designer Manipulation and Hygiene: Standing Equipment Used: Rolling walker Transfers/Ambulation Related to ADLs: Pt ambulates with min guard assist into BR ADL Comments: Pt unable to access Rt. foot  due to pain.  She used a back scratcher to don socks PTA    OT Diagnosis: Generalized weakness;Acute pain  OT Problem List: Decreased strength;Decreased knowledge of use of DME or AE;Pain OT Treatment Interventions: Self-care/ADL training;DME and/or AE instruction;Therapeutic activities;Patient/family education   OT Goals Acute Rehab OT Goals OT Goal Formulation: With patient Time For Goal Achievement: 10/18/12 Potential to Achieve Goals: Good ADL Goals Pt Will Perform Grooming: with supervision;Standing at sink ADL Goal: Grooming - Progress: Goal set today Pt Will Perform Lower Body Bathing: with supervision;Sit to stand from chair;Sit to stand from bed ADL Goal: Lower Body Bathing - Progress: Goal set today Pt Will Perform Lower Body Dressing: with supervision;Sit to stand from chair;Sit to stand from bed;with adaptive equipment ADL Goal: Lower Body Dressing - Progress: Goal set today Pt Will Transfer to Toilet: with supervision;Ambulation;Maintaining hip precautions;3-in-1 ADL Goal: Toilet Transfer - Progress: Goal set today Pt Will Perform Toileting - Clothing Manipulation: with supervision;Standing ADL Goal: Toileting - Clothing Manipulation - Progress: Goal set today Pt Will Perform Tub/Shower Transfer: with supervision;Ambulation;with DME ADL Goal: Tub/Shower Transfer - Progress: Goal set today  Visit Information  Last OT Received On: 10/11/12 Assistance Needed: +1    Subjective Data  Subjective: "I'Watts going to do whatever I can do get better" Patient Stated Goal: To regain independence, pain free   Prior Functioning     Home Living Lives With: Spouse Available Help at Discharge: Family Type of Home: House Home Access: Stairs to enter Secretary/administrator of Steps: 2 Entrance Stairs-Rails: None Home Layout: One level Bathroom Shower/Tub: Health visitor: Handicapped height Home Adaptive Equipment: None Prior Function Level of Independence:  Independent with assistive device(s) Able to Take Stairs?: Yes Driving: Yes 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 Communication Communication: No difficulties         Vision/Perception     Cognition  Cognition Overall Cognitive Status: Appears within functional limits for tasks assessed/performed Arousal/Alertness: Awake/alert Orientation Level: Appears intact for tasks assessed Behavior During Session: [facility] for tasks performed    Extremity/Trunk Assessment Right Upper Extremity Assessment RUE ROM/Strength/Tone: [facility] for tasks assessed RUE Coordination: WFL - gross/fine motor Left Upper Extremity Assessment LUE ROM/Strength/Tone: WFL for tasks assessed LUE Coordination: WFL - gross/fine motor Trunk Assessment Trunk Assessment: Normal     Mobility Bed Mobility Bed Mobility: Supine to Sit;Sitting - Scoot to Edge of Bed;Sit to Supine Supine to Sit: 4: Min assist;HOB flat Sitting - Scoot to Edge of Bed: 5: Supervision Sit to Supine: 4: Min assist;HOB flat Details for Bed Mobility Assistance: assist for R LE, verbal cues for technique Transfers Transfers: Sit to Stand;Stand to Sit Sit to Stand: 4: Min assist;3: Mod assist;From toilet;From bed;With upper extremity assist Stand to Sit: 4: Min assist;To bed;With upper extremity assist;3: Mod assist;To toilet Details for Transfer Assistance: Pt requires min A from bed with the height elevated and mod A from comfort height toilet.  Pt fatigued     Exercise Total Joint Exercises Ankle Circles/Pumps: AROM;Both;20 reps Quad Sets: AROM;Both;15 reps Gluteal Sets: AROM;Both;10 reps Short Arc Quad: AROM;15 reps;Right Heel Slides: AAROM;Right;15 reps Hip ABduction/ADduction: AROM;Right;15 reps Straight Leg Raises: AAROM;Right;10 reps   Balance     End of Session OT - End of Session Activity Tolerance: Patient tolerated treatment well Patient left: in bed;with call bell/phone within reach  GO      Renee Watts 10/11/2012, 4:11 PM

## 2012-10-11 NOTE — Progress Notes (Signed)
Physical Therapy Treatment Note   10/11/12 1400  PT Visit Information  Last PT Received On 10/11/12  Assistance Needed +1  PT Time Calculation  PT Start Time 1345  PT Stop Time 1413  PT Time Calculation (min) 28 min  Subjective Data  Subjective I'm gonna do whatever I need to to get better.  Precautions  Precautions None  Restrictions  Other Position/Activity Restrictions WBAT R LE  Cognition  Overall Cognitive Status Appears within functional limits for tasks assessed/performed  Arousal/Alertness Awake/alert  Bed Mobility  Bed Mobility Sit to Supine  Sit to Supine 4: Min assist;HOB flat  Details for Bed Mobility Assistance assist for R LE, verbal cues for technique  Transfers  Transfers Sit to Stand;Stand to Sit  Sit to Stand 4: Min guard;With upper extremity assist;From chair/3-in-1  Stand to Sit 4: Min guard;With upper extremity assist;To bed  Details for Transfer Assistance verbal cues for technique  Ambulation/Gait  Ambulation/Gait Assistance 4: Min guard  Ambulation Distance (Feet) 65 Feet  Assistive device Rolling walker  Ambulation/Gait Assistance Details verbal cues for step length and sequence  Gait Pattern Step-to pattern;Decreased stance time - right  Gait velocity decreased  Exercises  Exercises Total Joint  Total Joint Exercises  Ankle Circles/Pumps AROM;Both;20 reps  Quad Sets AROM;Both;15 reps  Gluteal Sets AROM;Both;10 reps  Short Arc Quad AROM;15 reps;Right  Heel Slides AAROM;Right;15 reps  Hip ABduction/ADduction AROM;Right;15 reps  Straight Leg Raises AAROM;Right;10 reps  PT - End of Session  Activity Tolerance Patient tolerated treatment well  Patient left in bed;with call bell/phone within reach  PT - Assessment/Plan  Comments on Treatment Session Pt ambulated again this afternoon and then performed exercises in supine.    PT Plan Discharge plan remains appropriate;Frequency remains appropriate  Follow Up Recommendations Home health PT  PT  equipment Rolling walker with 5" wheels  Acute Rehab PT Goals  PT Goal: Sit to Supine/Side - Progress Progressing toward goal  PT Goal: Sit to Stand - Progress Progressing toward goal  PT Goal: Stand to Sit - Progress Progressing toward goal  PT Goal: Perform Home Exercise Program - Progress Progressing toward goal  PT General Charges  $$ ACUTE PT VISIT 1 Procedure  PT Treatments  $Gait Training 8-22 mins  $Therapeutic Exercise 8-22 mins   Zenovia Jarred, PT, DPT 10/11/2012 Pager: [phone]

## 2012-10-11 NOTE — Progress Notes (Signed)
Patient ID: Renee Watts, female   DOB: 11/22/1950, 62 y.o.   MRN: [id number] PATIENT ID: Renee Watts        MRN:  [id number]          DOB/AGE: March 17, 1951 / 62 y.o.    [name], MD   Jacqualine Code, PA-C [address] [place], [place]  [address]                             [phone]   PROGRESS NOTE  Subjective:  negative for Chest Pain  negative for Shortness of Breath  positive for Nausea/Vomiting   negative for Calf Pain    Tolerating Diet: yes         Patient reports pain as moderate.     Some nausea this am, better now, without SOB or chest pain, sitting up in chair   Objective: Vital signs in last 24 hours:   Patient Vitals for the past 24 hrs:  BP Temp Temp src Pulse Resp SpO2  10/12/12 1154 - - - - 20 96 %  10/12/12 0800 - - - - 20 94 %  10/12/12 0500 153/82 mmHg 98.4 F (36.9 C) Oral 79 20 93 %  10/12/12 0450 105/66 mmHg 98.4 F (36.9 C) Oral 70 20 96 %  10/12/12 0400 - - - - 18 94 %  10/12/12 0000 - - - - 16 98 %  10/11/12 2251 124/65 mmHg 99.2 F (37.3 C) Oral 88 17 97 %  10/11/12 2000 - - - - 16 98 %  10/11/12 1600 - - - - 18 98 %  10/11/12 1410 121/72 mmHg 98.2 F (36.8 C) Oral 89 18 97 %      Intake/Output from previous day:   02/15 0701 - 02/16 0700 In: 1320 [P.O.:1320] Out: 1250 [Urine:1250]   Intake/Output this shift:   02/16 0701 - 02/16 1900 In: 120 [P.O.:120] Out: -    Intake/Output     02/15 0701 - 02/16 0700 02/16 0701 - 02/17 0700   P.O. 1320 120   I.V. (mL/kg)     IV Piggyback     Total Intake(mL/kg) 1320 (16.3) 120 (1.5)   Urine (mL/kg/hr) 1250 (0.6)    Blood     Total Output 1250     Net +70 +120        Urine Occurrence 2 x 2 x   Stool Occurrence        LABORATORY DATA:  Recent Labs  10/11/12 0449 10/12/12 0415  WBC 9.1 8.6  HGB 10.2* 9.7*  HCT 30.0* 28.6*  PLT 164 160    Recent Labs  10/11/12 0449  NA 142  K 3.9  CL 106  CO2 28  BUN 6  CREATININE 0.39*  GLUCOSE 127*  CALCIUM 8.9    Lab Results  Component Value Date   INR 0.97 10/01/2012    Recent Radiographic Studies :  Dg Chest 2 View  10/01/2012  *RADIOLOGY REPORT*  Clinical Data: Right total hip arthroplasty.  Hypertension.  CHEST - 2 VIEW  Comparison: None  Findings: Heart size is upper limits of normal in size.  Lung volumes are normal.  No pleural effusion or edema.  No airspace consolidation identified.  Review of the visualized osseous structures is unremarkable.  IMPRESSION:  1.  No acute findings.   Original Report Authenticated By: Signa Kell, M.D.    Dg Pelvis Portable  10/10/2012  *RADIOLOGY REPORT*  Clinical Data: Postop  PORTABLE PELVIS  Comparison: C-arm views of the right hip  Findings: A right total hip replacement is in good position.  No complicating features are noted.  Air is noted in the soft tissues around the right hip postoperatively.  The pelvic rami appear intact.  The left hip joint space is unremarkable.  IMPRESSION: Right total hip replacement.  No complicating features.   Original Report Authenticated By: Dwyane Dee, M.D.    Dg Hip Portable 1 View Right  10/10/2012  *RADIOLOGY REPORT*  Clinical Data: Postop hip replacement  PORTABLE RIGHT HIP - 1 VIEW  Comparison: C-arm intraoperative films of 10/10/2012  Findings: A cross-table lateral view shows the femoral and acetabular components of the right total hip replacement to be in good position.  Alignment is normal.  No complicating features are seen.  IMPRESSION: Good position of right total hip replacement.  No complicating features.   Original Report Authenticated By: Dwyane Dee, M.D.    Dg C-arm 61-120 Min-no Report  10/10/2012  CLINICAL DATA: Surgery   C-ARM 61-120 MINUTES  Fluoroscopy was utilized by the requesting physician.  No radiographic  interpretation.       Examination:  General appearance: alert and mild distress  Wound Exam: clean, dry, intact   Drainage:  None: wound tissue dry  Motor Exam: EHL, FHL, Anterior Tibial  and Posterior Tibial Intact  Sensory Exam: Superficial Peroneal, Deep Peroneal and Tibial normal  Vascular Exam: Normal  Assessment:    2 Days Post-Op  Procedure(s) (LRB): TOTAL HIP ARTHROPLASTY ANTERIOR APPROACH (Right)  ADDITIONAL DIAGNOSIS:  Principal Problem:   Degenerative arthritis of hip    Plan: Physical Therapy as ordered Weight Bearing as Tolerated (WBAT)  DVT Prophylaxis:  Aspirin  DISCHARGE PLAN: Home  DISCHARGE NEEDS: HHPT and Walker  Hope for D/C in am pending ambulatory status       [name] W 10/12/2012 12:15 PM

## 2012-10-11 NOTE — Evaluation (Signed)
Physical Therapy Evaluation Patient Details Name: Renee Watts MRN: [id number] DOB: 11/03/1950 Today's Date: 10/11/2012 Time: 0272-5366 PT Time Calculation (min): 26 min  PT Assessment / Plan / Recommendation Clinical Impression  Pt s/p R direct anterior THR.  Pt would benefit from acute PT services in order to improve transfers, ambulation and stairs to prepare for d/c home with spouse and daughters to assist as needed.    PT Assessment  Patient needs continued PT services    Follow Up Recommendations  Home health PT    Does the patient have the potential to tolerate intense rehabilitation      Barriers to Discharge        Equipment Recommendations  Rolling walker with 5" wheels    Recommendations for Other Services     Frequency 7X/week    Precautions / Restrictions Precautions Precautions: None Restrictions Other Position/Activity Restrictions: WBAT R LE   Pertinent Vitals/Pain Premedicated, ice applied, repositioned in recliner      Mobility  Bed Mobility Bed Mobility: Supine to Sit Supine to Sit: 4: Min assist;HOB elevated Details for Bed Mobility Assistance: assist for R LE, verbal cues for technique Transfers Transfers: Sit to Stand;Stand to Sit Sit to Stand: 4: Min guard;With upper extremity assist;From bed Stand to Sit: 4: Min guard;With upper extremity assist;To chair/3-in-1 Details for Transfer Assistance: verbal cues for technique Ambulation/Gait Ambulation/Gait Assistance: 4: Min guard Ambulation Distance (Feet): 56 Feet Assistive device: Rolling walker Ambulation/Gait Assistance Details: verbal cues for sequence, RW distance, step length, pt with difficulty advancing R LE due to hip flexors Gait Pattern: Step-to pattern;Decreased stance time - right Gait velocity: decreased    Exercises     PT Diagnosis: Difficulty walking;Acute pain  PT Problem List: Decreased mobility;Pain;Decreased strength;Decreased range of motion;Decreased knowledge  of use of DME PT Treatment Interventions: DME instruction;Gait training;Functional mobility training;Therapeutic activities;Stair training;Therapeutic exercise;Patient/family education   PT Goals Acute Rehab PT Goals PT Goal Formulation: With patient Time For Goal Achievement: 10/18/12 Potential to Achieve Goals: Good Pt will go Supine/Side to Sit: with modified independence PT Goal: Supine/Side to Sit - Progress: Goal set today Pt will go Sit to Supine/Side: with modified independence PT Goal: Sit to Supine/Side - Progress: Goal set today Pt will go Sit to Stand: with modified independence PT Goal: Sit to Stand - Progress: Goal set today Pt will go Stand to Sit: with modified independence PT Goal: Stand to Sit - Progress: Goal set today Pt will Go Up / Down Stairs: 1-2 stairs;with least restrictive assistive device;with supervision PT Goal: Up/Down Stairs - Progress: Goal set today Pt will Perform Home Exercise Program: with supervision, verbal cues required/provided PT Goal: Perform Home Exercise Program - Progress: Goal set today  Visit Information  Last PT Received On: 10/11/12 Assistance Needed: +1    Subjective Data  Subjective: I'm on your time. Patient Stated Goal: home   Prior Functioning  Home Living Lives With: Spouse Available Help at Discharge: Family Type of Home: House Home Access: Stairs to enter Secretary/administrator of Steps: 2 Entrance Stairs-Rails: None Home Layout: One level Home Adaptive Equipment: None Prior Function Level of Independence: Independent with assistive device(s) Comments: Pt reports she was using her mother's cane prior to surgery Communication Communication: No difficulties    Cognition  Cognition Overall Cognitive Status: Appears within functional limits for tasks assessed/performed Arousal/Alertness: Awake/alert Orientation Level: Appears intact for tasks assessed Behavior During Session: [facility] for tasks performed     Extremity/Trunk Assessment Right Upper Extremity Assessment  RUE ROM/Strength/Tone: [facility] for tasks assessed Left Upper Extremity Assessment LUE ROM/Strength/Tone: WFL for tasks assessed Right Lower Extremity Assessment RLE ROM/Strength/Tone: Deficits RLE ROM/Strength/Tone Deficits: unable to lift well against gravity, at least 2+/5 hip flexion, assist for bed mobility Left Lower Extremity Assessment LLE ROM/Strength/Tone: [facility] for tasks assessed   Balance    End of Session PT - End of Session Activity Tolerance: Patient tolerated treatment well Patient left: with call bell/phone within reach;in chair  GP     Renee Watts,KATHrine E 10/11/2012, 9:47 AM Zenovia Jarred, PT, DPT 10/11/2012 Pager: [phone]

## 2012-10-11 NOTE — Progress Notes (Signed)
Patient ID: Renee Watts, female   DOB: 04/18/1951, 62 y.o.   MRN: [id number] PATIENT ID: Renee Watts        MRN:  [id number]          DOB/AGE: May 09, 1951 / 62 y.o.    Renee Campbell, MD   Jacqualine Code, PA-C [address] [place], [place]  [address]                             [phone]   PROGRESS NOTE  Subjective:  negative for Chest Pain  negative for Shortness of Breath  negative for Nausea/Vomiting   negative for Calf Pain    Tolerating Diet: yes         Patient reports pain as mild.    Sitting in chair eating meal without problem,"already feel better"  Objective: Vital signs in last 24 hours:   Patient Vitals for the past 24 hrs:  BP Temp Temp src Pulse Resp SpO2 Height Weight  10/11/12 1152 - - - - 18 100 % - -  10/11/12 1000 125/53 mmHg 98 F (36.7 C) Oral 64 17 100 % - -  10/11/12 0739 - - - - 18 100 % - -  10/11/12 0500 139/68 mmHg 98.9 F (37.2 C) Oral 71 18 100 % - -  10/11/12 0400 - - - - 18 94 % - -  10/11/12 0234 121/69 mmHg 98.1 F (36.7 C) Oral 69 18 100 % - -  10/11/12 0000 - - - - 20 100 % - -  10/10/12 2200 125/67 mmHg 98.1 F (36.7 C) Oral 67 18 100 % - -  10/10/12 1937 - - - - 20 100 % - -  10/10/12 1810 125/68 mmHg 98.7 F (37.1 C) Oral 97 16 100 % - -  10/10/12 1710 137/68 mmHg 98.5 F (36.9 C) Oral 61 16 100 % - -  10/10/12 1610 158/60 mmHg 98.9 F (37.2 C) Oral 72 16 100 % - -  10/10/12 1524 - - - - 16 100 % - -  10/10/12 1510 157/71 mmHg 97.9 F (36.6 C) Oral 61 15 100 % - -  10/10/12 1410 156/72 mmHg 99.1 F (37.3 C) Oral - 14 100 % 5\' 6"  (1.676 m) 81.194 kg (179 lb)  10/10/12 1345 161/64 mmHg 97.7 F (36.5 C) - 53 14 99 % - -  10/10/12 1330 156/70 mmHg - - 55 15 100 % - -  10/10/12 1315 157/48 mmHg - - 61 12 100 % - -  10/10/12 1300 137/76 mmHg - - 88 13 100 % - -  10/10/12 1256 151/78 mmHg 97.7 F (36.5 C) - 91 12 100 % - -      Intake/Output from previous day:   02/14 0701 - 02/15 0700 In: 2640 [P.O.:240;  I.V.:2295] Out: 2740 [Urine:2240]   Intake/Output this shift:   02/15 0701 - 02/15 1900 In: 240 [P.O.:240] Out: -    Intake/Output     02/14 0701 - 02/15 0700 02/15 0701 - 02/16 0700   P.O. 240 240   I.V. (mL/kg) 2295 (28.3)    IV Piggyback 105    Total Intake(mL/kg) 2640 (32.5) 240 (3)   Urine (mL/kg/hr) 2240    Blood 500    Total Output 2740     Net -100 +240           LABORATORY DATA:  Recent Labs  10/11/12 0449  WBC 9.1  HGB 10.2*  HCT 30.0*  PLT 164    Recent Labs  10/11/12 0449  NA 142  K 3.9  CL 106  CO2 28  BUN 6  CREATININE 0.39*  GLUCOSE 127*  CALCIUM 8.9   Lab Results  Component Value Date   INR 0.97 10/01/2012    Recent Radiographic Studies :  Dg Chest 2 View  10/01/2012  *RADIOLOGY REPORT*  Clinical Data: Right total hip arthroplasty.  Hypertension.  CHEST - 2 VIEW  Comparison: None  Findings: Heart size is upper limits of normal in size.  Lung volumes are normal.  No pleural effusion or edema.  No airspace consolidation identified.  Review of the visualized osseous structures is unremarkable.  IMPRESSION:  1.  No acute findings.   Original Report Authenticated By: Signa Kell, M.D.    Dg Pelvis Portable  10/10/2012  *RADIOLOGY REPORT*  Clinical Data: Postop  PORTABLE PELVIS  Comparison: C-arm views of the right hip  Findings: A right total hip replacement is in good position.  No complicating features are noted.  Air is noted in the soft tissues around the right hip postoperatively.  The pelvic rami appear intact.  The left hip joint space is unremarkable.  IMPRESSION: Right total hip replacement.  No complicating features.   Original Report Authenticated By: Dwyane Dee, M.D.    Dg Hip Portable 1 View Right  10/10/2012  *RADIOLOGY REPORT*  Clinical Data: Postop hip replacement  PORTABLE RIGHT HIP - 1 VIEW  Comparison: C-arm intraoperative films of 10/10/2012  Findings: A cross-table lateral view shows the femoral and acetabular components of the  right total hip replacement to be in good position.  Alignment is normal.  No complicating features are seen.  IMPRESSION: Good position of right total hip replacement.  No complicating features.   Original Report Authenticated By: Dwyane Dee, M.D.    Dg C-arm 61-120 Min-no Report  10/10/2012  CLINICAL DATA: Surgery   C-ARM 61-120 MINUTES  Fluoroscopy was utilized by the requesting physician.  No radiographic  interpretation.       Examination:  General appearance: alert and no distress  Wound Exam: clean, dry, intact   Drainage:  None: wound tissue dry  Motor Exam: EHL, FHL, Anterior Tibial and Posterior Tibial Intact  Sensory Exam: Superficial Peroneal, Deep Peroneal and Tibial normal  Vascular Exam: Normal  Assessment:    1 Day Post-Op  Procedure(s) (LRB): TOTAL HIP ARTHROPLASTY ANTERIOR APPROACH (Right)  ADDITIONAL DIAGNOSIS:  Principal Problem:   Degenerative arthritis of hip     Plan: Physical Therapy as ordered Weight Bearing as Tolerated (WBAT)  DVT Prophylaxis:  Aspirin  DISCHARGE PLAN: Home  DISCHARGE NEEDS: HHPT and Walker   no post op problems,OOB with PT      Renee Watts 10/11/2012 12:32 PM

## 2012-10-12 LAB — GLUCOSE, CAPILLARY: Glucose-Capillary: 118 mg/dL — ABNORMAL HIGH (ref 70–99)

## 2012-10-12 LAB — CBC
HCT: 28.6 % — ABNORMAL LOW (ref 36.0–46.0)
MCHC: 33.9 g/dL (ref 30.0–36.0)
MCV: 88.5 fL (ref 78.0–100.0)
RDW: 13.4 % (ref 11.5–15.5)

## 2012-10-12 NOTE — Progress Notes (Addendum)
Physical Therapy Treatment Patient Details Name: Renee Watts MRN: [id number] DOB: March 17, 1951 Today's Date: 10/12/2012 Time: 4098-1191 PT Time Calculation (min): 26 min  PT Assessment / Plan / Recommendation Comments on Treatment Session  Good progress with gait distance, stair training completed, ther ex completed. Expect will be ready to DC tomorrow     Follow Up Recommendations  Home health PT     Does the patient have the potential to tolerate intense rehabilitation     Barriers to Discharge        Equipment Recommendations  Rolling walker with 5" wheels    Recommendations for Other Services    Frequency     Plan Discharge plan remains appropriate;Frequency remains appropriate    Precautions / Restrictions Precautions Precautions: None Restrictions Other Position/Activity Restrictions: WBAT R LE   Pertinent Vitals/Pain **6/10 R hip Premedicated, ice applied*    Mobility  Bed Mobility Bed Mobility: Not assessed Transfers Transfers: Sit to Stand;Stand to Sit Sit to Stand: With upper extremity assist;5: Supervision;From chair/3-in-1;With armrests Stand to Sit: 5: Supervision;To chair/3-in-1;With upper extremity assist Details for Transfer Assistance: supervision for safety Ambulation/Gait Ambulation/Gait Assistance: 5: Supervision Ambulation Distance (Feet): 125 Feet Assistive device: Rolling walker Gait Pattern: Step-to pattern General Gait Details: frequent VCs for sequencing, VCs to correct flexed neck Stairs: Yes Stairs Assistance: 4: Min guard Stair Management Technique: No rails;Backwards;With walker;Step to pattern Number of Stairs: 2    Exercises Total Joint Exercises Ankle Circles/Pumps: AROM;Both;20 reps Quad Sets: AROM;Both;15 reps Short Arc Quad: AROM;15 reps;Right Heel Slides: AAROM;Right;15 reps Hip ABduction/ADduction: AROM;Right;15 reps Long Arc Quad: AROM;Right;10 reps   PT Diagnosis:    PT Problem List:   PT Treatment  Interventions:     PT Goals Acute Rehab PT Goals PT Goal Formulation: With patient Time For Goal Achievement: 10/18/12 Potential to Achieve Goals: Good Pt will go Supine/Side to Sit: with modified independence Pt will go Sit to Supine/Side: with modified independence Pt will go Sit to Stand: with modified independence PT Goal: Sit to Stand - Progress: Progressing toward goal Pt will go Stand to Sit: with modified independence PT Goal: Stand to Sit - Progress: Progressing toward goal Pt will Go Up / Down Stairs: 1-2 stairs;with least restrictive assistive device;with supervision PT Goal: Up/Down Stairs - Progress: Progressing toward goal Pt will Perform Home Exercise Program: with supervision, verbal cues required/provided PT Goal: Perform Home Exercise Program - Progress: Progressing toward goal  Visit Information  Last PT Received On: 10/12/12 Assistance Needed: +1    Subjective Data  Subjective: I'm motiviated. Patient Stated Goal: to go grocery shopping and to church without needing help   Cognition  Cognition Overall Cognitive Status: Appears within functional limits for tasks assessed/performed Arousal/Alertness: Awake/alert Orientation Level: Appears intact for tasks assessed Behavior During Session: [facility] for tasks performed    Balance     End of Session PT - End of Session Activity Tolerance: Patient tolerated treatment well Patient left: in bed;with call bell/phone within reach   GP     Tamala Ser 10/12/2012, 11:50 AM

## 2012-10-12 NOTE — Progress Notes (Signed)
Occupational Therapy Treatment Patient Details Name: EMBRIE MIKKELSEN MRN: [id number] DOB: 1950-09-17 Today's Date: 10/12/2012 Time: 7829-5621 OT Time Calculation (min): 27 min  OT Assessment / Plan / Recommendation Comments on Treatment Session Pt is very motivated and making excellent progress with OT    Follow Up Recommendations  No OT follow up;Supervision - Intermittent    Barriers to Discharge       Equipment Recommendations  3 in 1 bedside comode    Recommendations for Other Services    Frequency Min 2X/week   Plan Discharge plan remains appropriate    Precautions / Restrictions Precautions Precautions: None Restrictions Weight Bearing Restrictions: No Other Position/Activity Restrictions: WBAT R LE   Pertinent Vitals/Pain     ADL  Grooming: Wash/dry hands;Supervision/safety Where Assessed - Grooming: Supported standing Lower Body Bathing: Minimal assistance Where Assessed - Lower Body Bathing: Supported sit to stand Lower Body Dressing: Minimal assistance Where Assessed - Lower Body Dressing: Supported sit to Pharmacist, hospital: Radiographer, therapeutic Method: Sit to Barista: Raised toilet seat with arms (or 3-in-1 over toilet) Toileting - Clothing Manipulation and Hygiene: Supervision/safety Where Assessed - Engineer, mining and Hygiene: Standing Tub/Shower Transfer: Insurance risk surveyor Method: Science writer: Walk in shower (3-in-1) Equipment Used: Rolling walker Transfers/Ambulation Related to ADLs: supervision ADL Comments: Pt able to access top of Rt. foot to assist with donnind socks, pants (Discussed AE; however, pt feels like she has a viable option)    OT Diagnosis:    OT Problem List:   OT Treatment Interventions:     OT Goals ADL Goals ADL Goal: Grooming - Progress: Progressing toward goals ADL Goal: Lower Body Dressing - Progress: Progressing toward  goals ADL Goal: Toilet Transfer - Progress: Progressing toward goals ADL Goal: Toileting - Clothing Manipulation - Progress: Progressing toward goals ADL Goal: Tub/Shower Transfer - Progress: Progressing toward goals  Visit Information  Last OT Received On: 10/12/12 Assistance Needed: +1    Subjective Data      Prior Functioning       Cognition  Cognition Overall Cognitive Status: Appears within functional limits for tasks assessed/performed Arousal/Alertness: Awake/alert Orientation Level: Appears intact for tasks assessed Behavior During Session: [facility] for tasks performed    Mobility  Bed Mobility Bed Mobility: Not assessed Transfers Transfers: Sit to Stand;Stand to Sit Sit to Stand: 5: Supervision;With upper extremity assist;From chair/3-in-1 Stand to Sit: 5: Supervision;To chair/3-in-1;With upper extremity assist Details for Transfer Assistance: supervision for safety    Exercises  Total Joint Exercises Ankle Circles/Pumps: AROM;Both;20 reps Quad Sets: AROM;Both;15 reps Short Arc Quad: AROM;15 reps;Right Heel Slides: AAROM;Right;15 reps Hip ABduction/ADduction: AROM;Right;15 reps Long Arc Quad: AROM;Right;10 reps   Balance     End of Session OT - End of Session Activity Tolerance: Patient tolerated treatment well Patient left: in chair;with call bell/phone within reach  GO     Sherran Margolis M 10/12/2012, 11:55 AM

## 2012-10-12 NOTE — Progress Notes (Signed)
Physical Therapy Treatment Patient Details Name: Renee Watts MRN: [id number] DOB: [birth year] Today's Date: 10/12/2012 Time: 1308-6578 PT Time Calculation (min): 25 min  PT Assessment / Plan / Recommendation Comments on Treatment Session  Progressing well, ambulated 120' with RW, expect will be ready to DC home tomorrow.    Follow Up Recommendations  Home health PT     Does the patient have the potential to tolerate intense rehabilitation     Barriers to Discharge        Equipment Recommendations  Rolling walker with 5" wheels    Recommendations for Other Services    Frequency 7X/week   Plan Discharge plan remains appropriate    Precautions / Restrictions Precautions Precautions: None Restrictions Weight Bearing Restrictions: No Other Position/Activity Restrictions: WBAT R LE   Pertinent Vitals/Pain *7/10 R hip, ice applied, pain meds requested**    Mobility  Bed Mobility Bed Mobility: Not assessed Transfers Transfers: Sit to Stand;Stand to Sit Sit to Stand: With upper extremity assist;From chair/3-in-1;6: Modified independent (Device/Increase time);With armrests Stand to Sit: To chair/3-in-1;With upper extremity assist;6: Modified independent (Device/Increase time);With armrests Details for Transfer Assistance: supervision for safety Ambulation/Gait Ambulation/Gait Assistance: 5: Supervision Ambulation Distance (Feet): 120 Feet Assistive device: Rolling walker Gait Pattern: Step-to pattern Gait velocity: decreased General Gait Details: VCs sequencing and for flexed neck Stairs: Yes Stairs Assistance: 4: Min guard Stair Management Technique: No rails;Backwards;With walker;Step to pattern Number of Stairs: 2    Exercises Total Joint Exercises Ankle Circles/Pumps: AROM;Both;20 reps Quad Sets: AROM;Both;15 reps Short Arc Quad: AROM;15 reps;Right Heel Slides: AAROM;Right;15 reps Hip ABduction/ADduction: AROM;Right;15 reps Long Arc Quad: AROM;Right;10 reps    PT Diagnosis:    PT Problem List:   PT Treatment Interventions:     PT Goals Acute Rehab PT Goals PT Goal Formulation: With patient Time For Goal Achievement: 10/18/12 Potential to Achieve Goals: Good Pt will go Supine/Side to Sit: with modified independence Pt will go Sit to Supine/Side: with modified independence Pt will go Sit to Stand: with modified independence PT Goal: Sit to Stand - Progress: Met Pt will go Stand to Sit: with modified independence PT Goal: Stand to Sit - Progress: Met Pt will Go Up / Down Stairs: 1-2 stairs;with least restrictive assistive device;with supervision PT Goal: Up/Down Stairs - Progress: Progressing toward goal Pt will Perform Home Exercise Program: with supervision, verbal cues required/provided PT Goal: Perform Home Exercise Program - Progress: Progressing toward goal  Visit Information  Last PT Received On: 10/12/12 Assistance Needed: +1    Subjective Data  Subjective: I'm gonna do whatever I need to to get better. Patient Stated Goal: to go grocery shopping and to church without help   Cognition  Cognition Overall Cognitive Status: Appears within functional limits for tasks assessed/performed Arousal/Alertness: Awake/alert Orientation Level: Appears intact for tasks assessed Behavior During Session: [facility] for tasks performed    Balance     End of Session PT - End of Session Activity Tolerance: Patient tolerated treatment well Patient left: in chair;with call bell/phone within reach;with family/visitor present Nurse Communication: Mobility status   GP     [name] [name] 10/12/2012, 2:28 PM [phone]

## 2012-10-12 NOTE — Care Management (Signed)
CARE MANAGEMENT NOTE 10/12/2012  Patient:  Renee Watts, Renee Watts   Account Number:  [id number]  Date Initiated:  10/10/2012  Documentation initiated by:  Colleen Can  Subjective/Objective Assessment:   dx total rt hip replacemnt-anterior approach 10/10/2012     Action/Plan:   PT is pending- CM will follow after pt/ot eval completed.   Anticipated DC Date:  10/13/2012   Anticipated DC Plan:        DC Planning Services  CM consult      Choice offered to / List presented to:  C-1 Patient   DME arranged  3-N-1  WALKER - ROLLING  [facility]        HH arranged  HH-2 PT      [facility] agency  [facility]   Status of service:  Completed, signed off Medicare Important Message given?   (If response is "NO", the following Medicare IM given date fields will be blank) Date Medicare IM given:   Date Additional Medicare IM given:    Discharge Disposition:    Per UR Regulation:  Reviewed for med. necessity/level of care/duration of stay  If discussed at Long Length of Stay Meetings, dates discussed:    Comments:  10/12/12 1400 Braidon Chermak,RN,BSN [phone] Cm spoke with patient concerning discharge planning. Per pt choice [facility] to provide [facility] services upon discharge. Per [place] rep spoke with pt on 10/11/12. Pt request RW,BSC, [facility]. [facility] notified of DME referral. Orders faxed to [facility] at [phone]. confirmation received. Pt states adult children to assist with homecare.

## 2012-10-13 ENCOUNTER — Encounter (HOSPITAL_COMMUNITY): Payer: Self-pay | Admitting: Orthopaedic Surgery

## 2012-10-13 LAB — CBC
HCT: 27 % — ABNORMAL LOW (ref 36.0–46.0)
Hemoglobin: 9.1 g/dL — ABNORMAL LOW (ref 12.0–15.0)
RBC: 3.04 MIL/uL — ABNORMAL LOW (ref 3.87–5.11)
WBC: 8.7 10*3/uL (ref 4.0–10.5)

## 2012-10-13 MED ORDER — ASPIRIN 325 MG PO TBEC: 325.0000 mg | DELAYED_RELEASE_TABLET | Freq: Two times a day (BID) | ORAL | Status: DC

## 2012-10-13 MED ORDER — METHOCARBAMOL 500 MG PO TABS: 500.0000 mg | ORAL_TABLET | Freq: Four times a day (QID) | ORAL | Status: DC | PRN

## 2012-10-13 MED ORDER — OXYCODONE-ACETAMINOPHEN 5-325 MG PO TABS: 1.0000 | ORAL_TABLET | ORAL | Status: DC | PRN

## 2012-10-13 NOTE — Progress Notes (Signed)
Physical Therapy Treatment Patient Details Name: Renee Watts MRN: [id number] DOB: [birth year] Today's Date: 10/13/2012 Time: 4098-1191 PT Time Calculation (min): 25 min  PT Assessment / Plan / Recommendation Comments on Treatment Session  Continues to progress well with mobility. Ready to DC home with HHPT.    Follow Up Recommendations  Home health PT     Does the patient have the potential to tolerate intense rehabilitation     Barriers to Discharge        Equipment Recommendations  Rolling walker with 5" wheels    Recommendations for Other Services    Frequency 7X/week   Plan Discharge plan remains appropriate    Precautions / Restrictions Precautions Precautions: None Restrictions Weight Bearing Restrictions: No Other Position/Activity Restrictions: WBAT R LE   Pertinent Vitals/Pain *3/10 R hip Ice applied**    Mobility  Bed Mobility Bed Mobility: Supine to Sit;Sitting - Scoot to Edge of Bed;Sit to Supine Supine to Sit: HOB elevated;6: Modified independent (Device/Increase time) Sitting - Scoot to Edge of Bed: 5: Supervision;7: Independent Transfers Sit to Stand: With upper extremity assist;6: Modified independent (Device/Increase time);From chair/3-in-1;With armrests;From bed Stand to Sit: With upper extremity assist;To toilet;6: Modified independent (Device/Increase time);To chair/3-in-1;With armrests Ambulation/Gait Ambulation/Gait Assistance: 5: Supervision Ambulation Distance (Feet): 140 Feet Assistive device: Rolling walker Gait Pattern: Step-to pattern Gait velocity: decreased General Gait Details: VCs sequencing and for flexed neck Stairs: No    Exercises Total Joint Exercises Short Arc Quad: AROM;Right;20 reps Heel Slides: AAROM;Right;15 reps Hip ABduction/ADduction: AROM;Right;15 reps Long Arc Quad: AROM;Right;20 reps   PT Diagnosis:    PT Problem List:   PT Treatment Interventions:     PT Goals Acute Rehab PT Goals PT Goal Formulation:  With patient Time For Goal Achievement: 10/18/12 Potential to Achieve Goals: Good Pt will go Supine/Side to Sit: with modified independence PT Goal: Supine/Side to Sit - Progress: Met Pt will go Sit to Supine/Side: with modified independence Pt will go Sit to Stand: with modified independence PT Goal: Sit to Stand - Progress: Met Pt will go Stand to Sit: with modified independence PT Goal: Stand to Sit - Progress: Met Pt will Go Up / Down Stairs: 1-2 stairs;with least restrictive assistive device;with supervision Pt will Perform Home Exercise Program: with supervision, verbal cues required/provided PT Goal: Perform Home Exercise Program - Progress: Progressing toward goal  Visit Information  Last PT Received On: 10/13/12 Assistance Needed: +1    Subjective Data  Subjective: I'm gonna do whatever I need to to get better. Patient Stated Goal: to go grocery shopping and to church without help   Cognition  Cognition Overall Cognitive Status: Appears within functional limits for tasks assessed/performed Arousal/Alertness: Awake/alert Orientation Level: Appears intact for tasks assessed Behavior During Session: [facility] for tasks performed    Balance     End of Session PT - End of Session Activity Tolerance: Patient tolerated treatment well Patient left: in chair;with call bell/phone within reach   GP     Tamala Ser 10/13/2012, 9:56 AM [phone]

## 2012-10-13 NOTE — Discharge Summary (Signed)
Patient ID: Renee Watts MRN: [id number] DOB/AGE: 12-09-1950 62 y.o.  Admit date: 10/10/2012 Discharge date: 10/13/2012  Admission Diagnoses:  Principal Problem:   Degenerative arthritis of hip   Discharge Diagnoses:  Same  Past Medical History  Diagnosis Date  . Hyperlipidemia   . Hypertension   . GERD (gastroesophageal reflux disease)   . Depression   . Arthritis   . Low back pain   . Osteoarthritis, knee     Bilateral, s/p 3 total knee replacement surgerues on right, last 2005. Dr. Shelle Iron  . Pellegrini-Steida syndrome     Myositis ossificans  . Obesity   . Dependent edema     Bilateral  . KNEE REPLACEMENT, HX OF 06/17/2006    Annotation: Right multiple re-do's 2003 & 2004 Qualifier: Diagnosis of  By: Candis Musa MD, Ruben Reason.   . HYSTERECTOMY, HX OF 06/17/2006    Annotation: Secondary to Fibroids Qualifier: Diagnosis of  By: Candis Musa MD, Ruben Reason TONSILLECTOMY, HX OF 06/17/2006    Qualifier: Diagnosis of  By: Candis Musa MD, Veronique D.   . Diabetes mellitus     ORAL MEDICATION  . Shingles Jul 10 2012    LESIONS CLEARED BUT NOT TOLERATING ANY THING TIGHT AGAINST BODY--SHINGLES WERE AROSS BACK AND ABDOMEN    Surgeries: Procedure(s): TOTAL HIP ARTHROPLASTY ANTERIOR APPROACH on 10/10/2012   Consultants:    Discharged Condition: Improved  Hospital Course: Renee Watts is an 63 y.o. female who was admitted 10/10/2012 for operative treatment ofDegenerative arthritis of hip. Patient has severe unremitting pain that affects sleep, daily activities, and work/hobbies. After pre-op clearance the patient was taken to the operating room on 10/10/2012 and underwent  Procedure(s): TOTAL HIP ARTHROPLASTY ANTERIOR APPROACH.    Patient was given perioperative antibiotics: Anti-infectives   Start     Dose/Rate Route Frequency Ordered Stop   10/10/12 1700  ceFAZolin (ANCEF) IVPB 1 g/50 mL premix    Comments:  AET: 2/14 @ 1257.  Pre-op abx given at 1053   1 g 100 mL/hr  over 30 Minutes Intravenous Every 6 hours 10/10/12 1415 10/10/12 2314   10/10/12 0906  ceFAZolin (ANCEF) IVPB 2 g/50 mL premix     2 g 100 mL/hr over 30 Minutes Intravenous On call to O.R. 10/10/12 0906 10/10/12 1053       Patient was given sequential compression devices, early ambulation, and chemoprophylaxis to prevent DVT.  Patient benefited maximally from hospital stay and there were no complications.    Recent vital signs: Patient Vitals for the past 24 hrs:  BP Temp Temp src Pulse Resp SpO2  10/13/12 0530 135/75 mmHg 98.5 F (36.9 C) Oral 88 20 98 %  10/12/12 2158 133/62 mmHg 100.1 F (37.8 C) Oral 107 20 97 %  10/12/12 1600 - - - - 20 98 %  10/12/12 1447 136/68 mmHg 98.8 F (37.1 C) Oral 100 20 98 %  10/12/12 1154 - - - - 20 96 %  10/12/12 0800 - - - - 20 94 %     Recent laboratory studies:  Recent Labs  10/11/12 0449 10/12/12 0415 10/13/12 0422  WBC 9.1 8.6 8.7  HGB 10.2* 9.7* 9.1*  HCT 30.0* 28.6* 27.0*  PLT 164 160 168  NA 142  --   --   K 3.9  --   --   CL 106  --   --   CO2 28  --   --   BUN 6  --   --  CREATININE 0.39*  --   --   GLUCOSE 127*  --   --   CALCIUM 8.9  --   --      Discharge Medications:     Medication List    TAKE these medications       aspirin 325 MG EC tablet  Take 1 tablet (325 mg total) by mouth 2 (two) times daily.     furosemide 20 MG tablet  Commonly known as:  LASIX  Take 20 mg by mouth daily before breakfast.     gabapentin 100 MG capsule  Commonly known as:  NEURONTIN  Take 300 mg by mouth every evening.     lisinopril 20 MG tablet  Commonly known as:  PRINIVIL,ZESTRIL  Take 20 mg by mouth every evening.     metFORMIN 1000 MG tablet  Commonly known as:  GLUCOPHAGE  Take 1,000 mg by mouth 2 (two) times daily with a meal.     methocarbamol 500 MG tablet  Commonly known as:  ROBAXIN  Take 1 tablet (500 mg total) by mouth every 6 (six) hours as needed.     omeprazole 20 MG capsule  Commonly known as:   PRILOSEC  Take 20 mg by mouth daily as needed. Acid reflux     ondansetron 4 MG disintegrating tablet  Commonly known as:  ZOFRAN-ODT  Take 1 tablet (4 mg total) by mouth every 8 (eight) hours as needed for nausea.     oxyCODONE-acetaminophen 5-325 MG per tablet  Commonly known as:  ROXICET  Take 1-2 tablets by mouth every 4 (four) hours as needed for pain.     pravastatin 40 MG tablet  Commonly known as:  PRAVACHOL  Take 40 mg by mouth every evening.        Diagnostic Studies: Dg Chest 2 View  10/01/2012  *RADIOLOGY REPORT*  Clinical Data: Right total hip arthroplasty.  Hypertension.  CHEST - 2 VIEW  Comparison: None  Findings: Heart size is upper limits of normal in size.  Lung volumes are normal.  No pleural effusion or edema.  No airspace consolidation identified.  Review of the visualized osseous structures is unremarkable.  IMPRESSION:  1.  No acute findings.   Original Report Authenticated By: Signa Kell, M.D.    Dg Pelvis Portable  10/10/2012  *RADIOLOGY REPORT*  Clinical Data: Postop  PORTABLE PELVIS  Comparison: C-arm views of the right hip  Findings: A right total hip replacement is in good position.  No complicating features are noted.  Air is noted in the soft tissues around the right hip postoperatively.  The pelvic rami appear intact.  The left hip joint space is unremarkable.  IMPRESSION: Right total hip replacement.  No complicating features.   Original Report Authenticated By: Dwyane Dee, M.D.    Dg Hip Portable 1 View Right  10/10/2012  *RADIOLOGY REPORT*  Clinical Data: Postop hip replacement  PORTABLE RIGHT HIP - 1 VIEW  Comparison: C-arm intraoperative films of 10/10/2012  Findings: A cross-table lateral view shows the femoral and acetabular components of the right total hip replacement to be in good position.  Alignment is normal.  No complicating features are seen.  IMPRESSION: Good position of right total hip replacement.  No complicating features.   Original  Report Authenticated By: Dwyane Dee, M.D.    Dg C-arm 61-120 Min-no Report  10/10/2012  CLINICAL DATA: Surgery   C-ARM 61-120 MINUTES  Fluoroscopy was utilized by the requesting physician.  No radiographic  interpretation.  Disposition: 01-Home or Self Care      Discharge Orders   Future Orders Complete By Expires     Call MD / Call 911  As directed     Comments:      If you experience chest pain or shortness of breath, CALL 911 and be transported to the hospital emergency room.  If you develope a fever above 101 F, pus (white drainage) or increased drainage or redness at the wound, or calf pain, call your surgeon's office.    Constipation Prevention  As directed     Comments:      Drink plenty of fluids.  Prune juice may be helpful.  You may use a stool softener, such as Colace (over the counter) 100 mg twice a day.  Use MiraLax (over the counter) for constipation as needed.    Diet - low sodium heart healthy  As directed     Discharge instructions  As directed     Comments:      Increase your activities as comfort allows. Expect right thigh and leg swelling. Leave your current dressing in place on your right hip incision until this Thursday am 10/16/12. On Thurday, you can remove your dressing and get your incision wet in the shower, then a new dry dressing daily.    Discharge patient  As directed     Increase activity slowly as tolerated  As directed        Follow-up Information   Follow up with Kathryne Hitch, MD In 2 weeks.   Contact information:   [address] [place] [address] [phone]        Signed: Kathryne Hitch 10/13/2012, 7:11 AM

## 2012-10-13 NOTE — Progress Notes (Signed)
Subjective: 3 Days Post-Op Procedure(s) (LRB): TOTAL HIP ARTHROPLASTY ANTERIOR APPROACH (Right) Patient reports pain as mild.  Asymptomatic acute blood loss anemia.  Objective: Vital signs in last 24 hours: Temp:  [98.5 F (36.9 C)-100.1 F (37.8 C)] 98.5 F (36.9 C) (02/17 0530) Pulse Rate:  [88-107] 88 (02/17 0530) Resp:  [20] 20 (02/17 0530) BP: (133-136)/(62-75) 135/75 mmHg (02/17 0530) SpO2:  [94 %-98 %] 98 % (02/17 0530)  Intake/Output from previous day: 02/16 0701 - 02/17 0700 In: 480 [P.O.:480] Out: -  Intake/Output this shift:     Recent Labs  10/11/12 0449 10/12/12 0415 10/13/12 0422  HGB 10.2* 9.7* 9.1*    Recent Labs  10/12/12 0415 10/13/12 0422  WBC 8.6 8.7  RBC 3.23* 3.04*  HCT 28.6* 27.0*  PLT 160 168    Recent Labs  10/11/12 0449  NA 142  K 3.9  CL 106  CO2 28  BUN 6  CREATININE 0.39*  GLUCOSE 127*  CALCIUM 8.9   No results found for this basename: LABPT, INR,  in the last 72 hours  Sensation intact distally Intact pulses distally Dorsiflexion/Plantar flexion intact Incision: dressing C/D/I  Assessment/Plan: 3 Days Post-Op Procedure(s) (LRB): TOTAL HIP ARTHROPLASTY ANTERIOR APPROACH (Right) Discharge home with home health  Renee Watts 10/13/2012, 7:07 AM

## 2012-10-28 ENCOUNTER — Telehealth: Payer: Self-pay | Admitting: *Deleted

## 2012-10-28 NOTE — Telephone Encounter (Signed)
Pt calls and c/o increased acid reflux since her hip replacement surg in feb, she states she cannot get out until 3/11 or after that date. She states she has doubled her prilosec and it does not work, omeprazole does not work either. She wonders if you can call something else in for her. Please advise

## 2012-10-28 NOTE — Telephone Encounter (Signed)
Renee Watts, if taking a BID PPI is not working, she needs to be seen by a physician as soon as possible. I say this because she is post op and has DM, we need to make sure this discomfort is not an anginal equivalent. I have never met her so it is difficult to make this assessment based on this information. Thanks.

## 2012-10-29 NOTE — Telephone Encounter (Signed)
Ok thanks, as long as she understands recommendation to be evaluated as soon as possible.

## 2012-10-29 NOTE — Telephone Encounter (Signed)
ok 

## 2012-10-29 NOTE — Telephone Encounter (Signed)
Pt called back, she was told it would be fine to come to clinic, she is scheduled for 3/6 at 0915 dr Virgina Organ

## 2012-10-29 NOTE — Telephone Encounter (Signed)
i had called and left a message for pt to call me back, didn't hear from her so i continued to call til she answered, i ask her to come in for an appt, explained dr Kem Kays and klima's reasoning that she needed to be evaluated for all issues that could cause the issue, she states she will call the orthosurg. And ask him if she could make the trip to clinic, she will call thurs 3/6 and give his answer.

## 2012-10-30 ENCOUNTER — Encounter: Payer: Self-pay | Admitting: Internal Medicine

## 2012-10-30 ENCOUNTER — Ambulatory Visit (INDEPENDENT_AMBULATORY_CARE_PROVIDER_SITE_OTHER): Payer: BC Managed Care – PPO | Admitting: Internal Medicine

## 2012-10-30 VITALS — BP 121/68 | HR 66 | Temp 97.2°F | Wt 178.2 lb

## 2012-10-30 DIAGNOSIS — Z96649 Presence of unspecified artificial hip joint: Secondary | ICD-10-CM

## 2012-10-30 DIAGNOSIS — K219 Gastro-esophageal reflux disease without esophagitis: Secondary | ICD-10-CM

## 2012-10-30 DIAGNOSIS — E119 Type 2 diabetes mellitus without complications: Secondary | ICD-10-CM

## 2012-10-30 DIAGNOSIS — Z79899 Other long term (current) drug therapy: Secondary | ICD-10-CM

## 2012-10-30 DIAGNOSIS — Z1231 Encounter for screening mammogram for malignant neoplasm of breast: Secondary | ICD-10-CM

## 2012-10-30 DIAGNOSIS — Z1239 Encounter for other screening for malignant neoplasm of breast: Secondary | ICD-10-CM

## 2012-10-30 MED ORDER — SUCRALFATE 1 G PO TABS: 1.0000 g | ORAL_TABLET | Freq: Four times a day (QID) | ORAL | Status: DC

## 2012-10-30 MED ORDER — ESOMEPRAZOLE MAGNESIUM 40 MG PO CPDR: 40.0000 mg | DELAYED_RELEASE_CAPSULE | Freq: Every day | ORAL | Status: DC

## 2012-10-30 NOTE — Assessment & Plan Note (Signed)
Lab Results  Component Value Date   HGBA1C 6.1 10/30/2012   HGBA1C 6.3 08/06/2012   HGBA1C 6.3 05/07/2012    Assessment:  Diabetes control: good control (HgbA1C at goal)  Progress toward A1C goal:  at goal  Plan:  Medications:  Continue current medications: Metformin 1000mg  bid  Home glucose monitoring:daily  Instruction/counseling given: continue current regimen, reminded about foot care  Educational resources provided: brochure  Self management tools provided: home glucose logbook

## 2012-10-30 NOTE — Patient Instructions (Addendum)
Please stop taking Prilosec  Please START taking nexium daily and Carafate to help with acid reflux.  Try for one week, if no improvement or worsening, trouble swallowing, or pain, call clinic [phone] and we will refer to GI doctor  If you notice any diarrhea, call the clinic right away and stop taking the nexium  We have referred you for mammogram  I am glad you are doing well in your recovery! Please take care of yourself.   Thank you for coming in.

## 2012-10-30 NOTE — Progress Notes (Signed)
Subjective:   Patient ID: Renee Watts female   DOB: December 11, 1950 62 y.o.   MRN: [id number]  HPI: [name] is a 61 y.o. female with PMH of HTN, DM2, and GERD who is presenting to clinic today s/p total R hip arthroplasty 10/10/2012 for complaints of worsening acid reflux since surgery.  She claims she has had no relief with prilosec and has even tried doubling her daily dose. She had infrequent acid reflux in the past but since surgery it has just been getting worse.  Denies chest pain, odynophagia, trouble swallowing, but does report belching and burning sensation in esophagus region but not substernal and non-radiating.  Worse mid-afternoon and improves slightly during the day.  She denies eating fatty or fried foods, no coffee or chocolate, and has stopped drinking diet sodas as well.  She claims sometimes even certain smells can trigger the acid reflux.  She claims that prior to her surgery she had two days of nausea and vomiting which subsequently resolved and then after surgery she had some discomfort post extubation but did not think anything of it.  She then had spaghetti post-op and has been having acid reflux since then.    Her HTN and DM2 is well controlled.  Since surgery, she claims to be doing really well, improving daily, dressings have been removed and follows with her surgeon regularly.  She currently uses a wheelchair and cane for support and has been instructed for limited mobility at this time.  Her pain is well controlled.     She denies any chest pain, N/V/D, shortness of breath, abdominal pain, or any urinary complaints at this time.    Past Medical History  Diagnosis Date  . Hyperlipidemia   . Hypertension   . GERD (gastroesophageal reflux disease)   . Depression   . Arthritis   . Low back pain   . Osteoarthritis, knee     Bilateral, s/p 3 total knee replacement surgerues on right, last 2005. Dr. Shelle Iron  . Pellegrini-Steida syndrome     Myositis ossificans   . Obesity   . Dependent edema     Bilateral  . KNEE REPLACEMENT, HX OF 06/17/2006    Annotation: Right multiple re-do's 2003 & 2004 Qualifier: Diagnosis of  By: Candis Musa MD, Ruben Reason.   . HYSTERECTOMY, HX OF 06/17/2006    Annotation: Secondary to Fibroids Qualifier: Diagnosis of  By: Candis Musa MD, Ruben Reason TONSILLECTOMY, HX OF 06/17/2006    Qualifier: Diagnosis of  By: Candis Musa MD, Veronique D.   . Diabetes mellitus     ORAL MEDICATION  . Shingles Jul 10 2012    LESIONS CLEARED BUT NOT TOLERATING ANY THING TIGHT AGAINST BODY--SHINGLES WERE AROSS BACK AND ABDOMEN   Current Outpatient Prescriptions  Medication Sig Dispense Refill  . aspirin EC 325 MG EC tablet Take 1 tablet (325 mg total) by mouth 2 (two) times daily.  30 tablet  0  . furosemide (LASIX) 20 MG tablet Take 20 mg by mouth daily before breakfast.      . gabapentin (NEURONTIN) 100 MG capsule Take 300 mg by mouth every evening.      Marland Kitchen lisinopril (PRINIVIL,ZESTRIL) 20 MG tablet Take 20 mg by mouth every evening.      . metFORMIN (GLUCOPHAGE) 1000 MG tablet Take 1,000 mg by mouth 2 (two) times daily with a meal.      . methocarbamol (ROBAXIN) 500 MG tablet Take 1 tablet (500 mg total) by mouth every 6 (  six) hours as needed.  60 tablet  0  . omeprazole (PRILOSEC) 20 MG capsule Take 20 mg by mouth daily as needed. Acid reflux      . ondansetron (ZOFRAN-ODT) 4 MG disintegrating tablet Take 1 tablet (4 mg total) by mouth every 8 (eight) hours as needed for nausea.  10 tablet  0  . oxyCODONE-acetaminophen (ROXICET) 5-325 MG per tablet Take 1-2 tablets by mouth every 4 (four) hours as needed for pain.  60 tablet  0  . pravastatin (PRAVACHOL) 40 MG tablet Take 40 mg by mouth every evening.       No current facility-administered medications for this visit.   Family History  Problem Relation Age of Onset  . Diabetes Mother   . Hypertension Mother   . Hypertension Father   . Cancer Father     Lung CA  . Coronary artery  disease Father    History   Social History  . Marital Status: Married    Spouse Name: N/A    Number of Children: N/A  . Years of Education: N/A   Social History Main Topics  . Smoking status: Never Smoker   . Smokeless tobacco: Never Used  . Alcohol Use: No  . Drug Use: No  . Sexually Active: Not on file   Other Topics Concern  . Not on file   Social History Narrative   Married, 3 daughters.   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  HEENT: Denies photophobia, eye pain, redness, hearing loss, ear pain, congestion, sore throat, rhinorrhea, sneezing, mouth sores, trouble swallowing, neck pain, neck stiffness and tinnitus.   Respiratory: Denies SOB, DOE, cough, chest tightness,  and wheezing.   Cardiovascular: Denies chest pain, palpitations and leg swelling.  Gastrointestinal: acid reflux.  Denies nausea, vomiting, abdominal pain, diarrhea, constipation, blood in stool and abdominal distention.  Genitourinary: Denies dysuria, urgency, frequency, hematuria, flank pain and difficulty urinating.  Musculoskeletal: s/p hip arthroplasty.   Skin: Denies pallor, rash and wound.  Neurological: Denies dizziness, seizures, syncope, weakness, light-headedness, numbness and headaches.  Hematological: Denies adenopathy. Easy bruising, personal or family bleeding history  Psychiatric/Behavioral: Denies suicidal ideation, mood changes, confusion, nervousness, sleep disturbance and agitation  Objective:  Physical Exam: Filed Vitals:   10/30/12 0935  BP: 121/68  Pulse: 66  Temp: 97.2 F (36.2 C)  TempSrc: Oral  Weight: 178 lb 3.2 oz (80.831 kg)   Constitutional: Vital signs reviewed.  Patient is a well-developed and well-nourished female in no acute distress and cooperative with exam. Alert and oriented x3.   Head: Normocephalic and atraumatic Ear: TM normal bilaterally Mouth: no erythema or exudates, MMM.    Eyes: PERRL, EOMI, conjunctivae normal, No scleral icterus.  Neck: Supple, Trachea midline normal ROM, No mass.  Cardiovascular: RRR, S1 normal, S2 normal, no MRG, pulses symmetric and intact bilaterally, non-tender to palpation Pulmonary/Chest: CTAB, no wheezes, rales, or rhonchi Abdominal: Soft. Non-tender, non-distended, bowel sounds are normal, no masses, organomegaly, or guarding present.  GU: no CVA tenderness Musculoskeletal: No joint deformities, erythema, or stiffness, ROM full and nontender Hematology: no cervical, inginal, or axillary adenopathy.  Neurological: A&O x3, Strength is normal and symmetric bilaterally, cranial nerve II-XII are grossly intact, no focal motor deficit, sensory intact to light touch bilaterally.  Skin: Warm, dry and intact. No rash, cyanosis,  or clubbing.  Psychiatric: Normal mood and affect. speech and behavior is normal. Judgment and thought content normal. Cognition and memory are normal.   Assessment & Plan:  Discussed with Dr. Criselda Peaches  ?Erosive esophagitis--will try nexium qd and carafate.  No relief in 1 week will refer to GI

## 2012-10-30 NOTE — Assessment & Plan Note (Addendum)
Complaining of worsening acid reflux s/p hip surgery last month.  Burning sensation in throat, non radiating, not noted to be midsternal. Denies any chest pain, nausea, or vomiting.  +belching. Taking pain medication s/p surgery.  No relief with bid prilosec.  Denies odynophagia or any trouble swallowing or abdominal pain.    -concern for erosive esophagitis s/p surgery -will d/c prilosec -will do 1 week trial of 40mg  nexium daily, and bid if needed along with carafate -instructed if no relief or worsening of symptoms, to call clinic and will need GI consult.

## 2012-11-17 ENCOUNTER — Other Ambulatory Visit: Payer: Self-pay | Admitting: Internal Medicine

## 2012-11-18 ENCOUNTER — Other Ambulatory Visit: Payer: Self-pay | Admitting: *Deleted

## 2012-11-18 MED ORDER — FUROSEMIDE 20 MG PO TABS: 20.0000 mg | ORAL_TABLET | Freq: Every day | ORAL | Status: DC

## 2012-12-04 ENCOUNTER — Ambulatory Visit
Admission: RE | Admit: 2012-12-04 | Discharge: 2012-12-04 | Disposition: A | Discharge status: Home / Self Care | Payer: BC Managed Care – PPO | Source: Ambulatory Visit | Attending: Internal Medicine | Admitting: Internal Medicine

## 2012-12-04 DIAGNOSIS — Z1231 Encounter for screening mammogram for malignant neoplasm of breast: Secondary | ICD-10-CM

## 2013-01-01 ENCOUNTER — Encounter (HOSPITAL_COMMUNITY): Payer: Self-pay | Admitting: Pharmacy Technician

## 2013-01-01 ENCOUNTER — Other Ambulatory Visit (HOSPITAL_COMMUNITY): Payer: Self-pay | Admitting: Orthopaedic Surgery

## 2013-01-02 ENCOUNTER — Other Ambulatory Visit (HOSPITAL_COMMUNITY): Payer: Self-pay | Admitting: *Deleted

## 2013-01-05 ENCOUNTER — Encounter (HOSPITAL_COMMUNITY)
Admission: RE | Admit: 2013-01-05 | Discharge: 2013-01-05 | Disposition: A | Discharge status: Home / Self Care | Payer: BC Managed Care – PPO | Source: Ambulatory Visit | Attending: Orthopaedic Surgery | Admitting: Orthopaedic Surgery

## 2013-01-05 ENCOUNTER — Encounter (HOSPITAL_COMMUNITY): Payer: Self-pay

## 2013-01-05 DIAGNOSIS — Z01812 Encounter for preprocedural laboratory examination: Secondary | ICD-10-CM | POA: Insufficient documentation

## 2013-01-05 DIAGNOSIS — M1710 Unilateral primary osteoarthritis, unspecified knee: Secondary | ICD-10-CM | POA: Insufficient documentation

## 2013-01-05 HISTORY — DX: Other seasonal allergic rhinitis: J30.2

## 2013-01-05 LAB — BASIC METABOLIC PANEL
BUN: 6 mg/dL (ref 6–23)
Calcium: 9.5 mg/dL (ref 8.4–10.5)
Chloride: 105 mEq/L (ref 96–112)
Creatinine, Ser: 0.41 mg/dL — ABNORMAL LOW (ref 0.50–1.10)
GFR calc Af Amer: >90 mL/min (ref >90)

## 2013-01-05 LAB — URINALYSIS, ROUTINE W REFLEX MICROSCOPIC
Glucose, UA: NEGATIVE mg/dL
Hgb urine dipstick: NEGATIVE
Specific Gravity, Urine: 1.019 (ref 1.005–1.030)

## 2013-01-05 LAB — SURGICAL PCR SCREEN
MRSA, PCR: NEGATIVE
Staphylococcus aureus: NEGATIVE

## 2013-01-05 LAB — APTT: aPTT: 31 seconds (ref 24–37)

## 2013-01-05 LAB — PROTIME-INR: Prothrombin Time: 12.5 seconds (ref 11.6–15.2)

## 2013-01-05 LAB — CBC
HCT: 35.8 % — ABNORMAL LOW (ref 36.0–46.0)
MCHC: 33.2 g/dL (ref 30.0–36.0)
MCV: 81.5 fL (ref 78.0–100.0)
Platelets: 197 10*3/uL (ref 150–400)
RDW: 13.7 % (ref 11.5–15.5)
WBC: 5.9 10*3/uL (ref 4.0–10.5)

## 2013-01-05 LAB — URINE MICROSCOPIC-ADD ON

## 2013-01-05 NOTE — Patient Instructions (Addendum)
20 Renee Watts  01/05/2013   Your procedure is scheduled on: 01/09/13  Report to [facility] at 0515 AM.  Call this number if you have problems the morning of surgery [phone]: [phone]   Remember:   Do not eat food or drink liquids After Midnight.   Do not wear jewelry, make-up or nail polish.  Do not wear lotions, powders, or perfumes. You may wear deodorant.  Do not shave 48 hours prior to surgery. Men may shave face and neck.  Do not bring valuables to the hospital.  Contacts, dentures or bridgework may not be worn into surgery.  Leave suitcase in the car. After surgery it may be brought to your room.  For patients admitted to the hospital, checkout time is 11:00 AM the day of discharge.    Please read over the following fact sheets that you were given: MRSA Information, blood fact sheet Birdie Sons, RN  pre op nurse call if needed [phone]    FAILURE TO FOLLOW THESE INSTRUCTIONS MAY RESULT IN CANCELLATION OF YOUR SURGERY   Patient Signature: ___________________________________________

## 2013-01-05 NOTE — Progress Notes (Signed)
EKG 09/10/12 on EPIC, Chest x-ray 10/01/12 on EPIC

## 2013-01-06 LAB — URINE CULTURE

## 2013-01-08 NOTE — Anesthesia Preprocedure Evaluation (Addendum)
Anesthesia Evaluation  Patient identified by MRN, date of birth, ID band Patient awake    Reviewed: Allergy & Precautions, H&P , NPO status , Patient's Chart, lab work & pertinent test results  Airway Mallampati: II TM Distance: >3 FB Neck ROM: full    Dental no notable dental hx. (+) Teeth Intact and Dental Advisory Given,    Pulmonary neg pulmonary ROS,  breath sounds clear to auscultation  Pulmonary exam normal       Cardiovascular hypertension, Pt. on medications Rhythm:regular Rate:Normal     Neuro/Psych Depression  Neuromuscular disease negative neurological ROS     GI/Hepatic negative GI ROS, Neg liver ROS, GERD-  ,  Endo/Other  diabetes, Well Controlled, Type 2, Oral Hypoglycemic Agents  Renal/GU negative Renal ROS  negative genitourinary   Musculoskeletal   Abdominal   Peds  Hematology negative hematology ROS (+)   Anesthesia Other Findings   Reproductive/Obstetrics                          Anesthesia Physical Anesthesia Plan  ASA: II  Anesthesia Plan: General   Post-op Pain Management:    Induction: Intravenous  Airway Management Planned: Oral ETT  Additional Equipment:   Intra-op Plan:   Post-operative Plan: Extubation in OR  Informed Consent: I have reviewed the patients History and Physical, chart, labs and discussed the procedure including the risks, benefits and alternatives for the proposed anesthesia with the patient or authorized representative who has indicated his/her understanding and acceptance.   Dental advisory given  Plan Discussed with:   Anesthesia Plan Comments:        Anesthesia Quick Evaluation

## 2013-01-09 ENCOUNTER — Encounter (HOSPITAL_COMMUNITY): Payer: Self-pay | Admitting: Anesthesiology

## 2013-01-09 ENCOUNTER — Inpatient Hospital Stay (HOSPITAL_COMMUNITY): Payer: BC Managed Care – PPO

## 2013-01-09 ENCOUNTER — Encounter (HOSPITAL_COMMUNITY)
Admission: RE | Disposition: A | Discharge status: Home Health | Payer: Self-pay | Source: Ambulatory Visit | Attending: Orthopaedic Surgery

## 2013-01-09 ENCOUNTER — Inpatient Hospital Stay (HOSPITAL_COMMUNITY)
Admission: RE | Admit: 2013-01-09 | Discharge: 2013-01-12 | DRG: 209 | Disposition: A | Discharge status: Home Health | Payer: BC Managed Care – PPO | Source: Ambulatory Visit | Attending: Orthopaedic Surgery | Admitting: Orthopaedic Surgery

## 2013-01-09 ENCOUNTER — Encounter (HOSPITAL_COMMUNITY): Payer: Self-pay | Admitting: *Deleted

## 2013-01-09 ENCOUNTER — Inpatient Hospital Stay (HOSPITAL_COMMUNITY): Payer: BC Managed Care – PPO | Admitting: Anesthesiology

## 2013-01-09 DIAGNOSIS — E871 Hypo-osmolality and hyponatremia: Secondary | ICD-10-CM | POA: Diagnosis not present

## 2013-01-09 DIAGNOSIS — M545 Low back pain, unspecified: Secondary | ICD-10-CM | POA: Diagnosis present

## 2013-01-09 DIAGNOSIS — I10 Essential (primary) hypertension: Secondary | ICD-10-CM | POA: Diagnosis present

## 2013-01-09 DIAGNOSIS — D62 Acute posthemorrhagic anemia: Secondary | ICD-10-CM | POA: Diagnosis not present

## 2013-01-09 DIAGNOSIS — F411 Generalized anxiety disorder: Secondary | ICD-10-CM | POA: Diagnosis present

## 2013-01-09 DIAGNOSIS — K219 Gastro-esophageal reflux disease without esophagitis: Secondary | ICD-10-CM | POA: Diagnosis present

## 2013-01-09 DIAGNOSIS — E669 Obesity, unspecified: Secondary | ICD-10-CM | POA: Diagnosis present

## 2013-01-09 DIAGNOSIS — R000 Tachycardia, unspecified: Secondary | ICD-10-CM | POA: Diagnosis not present

## 2013-01-09 DIAGNOSIS — M1710 Unilateral primary osteoarthritis, unspecified knee: Principal | ICD-10-CM | POA: Diagnosis present

## 2013-01-09 DIAGNOSIS — F329 Major depressive disorder, single episode, unspecified: Secondary | ICD-10-CM | POA: Diagnosis present

## 2013-01-09 DIAGNOSIS — Z01812 Encounter for preprocedural laboratory examination: Secondary | ICD-10-CM

## 2013-01-09 DIAGNOSIS — M7640 Tibial collateral bursitis [Pellegrini-Stieda], unspecified leg: Secondary | ICD-10-CM | POA: Diagnosis present

## 2013-01-09 DIAGNOSIS — E785 Hyperlipidemia, unspecified: Secondary | ICD-10-CM | POA: Diagnosis present

## 2013-01-09 DIAGNOSIS — M1712 Unilateral primary osteoarthritis, left knee: Secondary | ICD-10-CM

## 2013-01-09 DIAGNOSIS — E119 Type 2 diabetes mellitus without complications: Secondary | ICD-10-CM | POA: Diagnosis present

## 2013-01-09 DIAGNOSIS — F3289 Other specified depressive episodes: Secondary | ICD-10-CM | POA: Diagnosis present

## 2013-01-09 HISTORY — PX: TOTAL KNEE ARTHROPLASTY: SHX125

## 2013-01-09 LAB — TYPE AND SCREEN

## 2013-01-09 LAB — GLUCOSE, CAPILLARY
Glucose-Capillary: 117 mg/dL — ABNORMAL HIGH (ref 70–99)
Glucose-Capillary: 172 mg/dL — ABNORMAL HIGH (ref 70–99)
Glucose-Capillary: 189 mg/dL — ABNORMAL HIGH (ref 70–99)
Glucose-Capillary: 115 mg/dL — ABNORMAL HIGH (ref 70–99)

## 2013-01-09 SURGERY — ARTHROPLASTY, KNEE, TOTAL
Anesthesia: General | Site: Knee | Laterality: Left | Wound class: Clean

## 2013-01-09 MED ORDER — MENTHOL 3 MG MT LOZG: 1.0000 | LOZENGE | OROMUCOSAL | Status: DC | PRN

## 2013-01-09 MED ORDER — PROMETHAZINE HCL 25 MG/ML IJ SOLN: 6.2500 mg | INTRAMUSCULAR | Status: DC | PRN

## 2013-01-09 MED ORDER — FENTANYL CITRATE 0.05 MG/ML IJ SOLN
INTRAMUSCULAR | Status: DC | PRN
  Administered 2013-01-09: 50 ug via INTRAVENOUS
  Administered 2013-01-09: 25 ug via INTRAVENOUS
  Administered 2013-01-09: 50 ug via INTRAVENOUS
  Administered 2013-01-09 (×2): 25 ug via INTRAVENOUS
  Administered 2013-01-09: 50 ug via INTRAVENOUS
  Administered 2013-01-09 (×5): 25 ug via INTRAVENOUS

## 2013-01-09 MED ORDER — MIDAZOLAM HCL 5 MG/5ML IJ SOLN
INTRAMUSCULAR | Status: DC | PRN
  Administered 2013-01-09: 2 mg via INTRAVENOUS

## 2013-01-09 MED ORDER — SODIUM CHLORIDE 0.9 % IR SOLN
Status: DC | PRN
  Administered 2013-01-09: 1000 mL

## 2013-01-09 MED ORDER — ONDANSETRON HCL 4 MG PO TABS: 4.0000 mg | ORAL_TABLET | Freq: Four times a day (QID) | ORAL | Status: DC | PRN

## 2013-01-09 MED ORDER — ACETAMINOPHEN 325 MG PO TABS: 650.0000 mg | ORAL_TABLET | Freq: Four times a day (QID) | ORAL | Status: DC | PRN

## 2013-01-09 MED ORDER — LISINOPRIL 20 MG PO TABS
20.0000 mg | ORAL_TABLET | Freq: Every day | ORAL | Status: DC
  Administered 2013-01-09 – 2013-01-11 (×3): 20 mg via ORAL
  Filled 2013-01-09 (×4): qty 1

## 2013-01-09 MED ORDER — CEFAZOLIN SODIUM 1-5 GM-% IV SOLN
1.0000 g | Freq: Four times a day (QID) | INTRAVENOUS | Status: AC
  Administered 2013-01-09 (×2): 1 g via INTRAVENOUS
  Filled 2013-01-09 (×2): qty 50

## 2013-01-09 MED ORDER — GABAPENTIN 300 MG PO CAPS
300.0000 mg | ORAL_CAPSULE | Freq: Every day | ORAL | Status: DC
  Administered 2013-01-09 – 2013-01-11 (×3): 300 mg via ORAL
  Filled 2013-01-09 (×4): qty 1

## 2013-01-09 MED ORDER — ROCURONIUM BROMIDE 100 MG/10ML IV SOLN
INTRAVENOUS | Status: DC | PRN
  Administered 2013-01-09: 30 mg via INTRAVENOUS

## 2013-01-09 MED ORDER — HYDROMORPHONE HCL PF 1 MG/ML IJ SOLN
INTRAMUSCULAR | Status: DC | PRN
  Administered 2013-01-09 (×2): 1 mg via INTRAVENOUS

## 2013-01-09 MED ORDER — PROPOFOL 10 MG/ML IV BOLUS
INTRAVENOUS | Status: DC | PRN
  Administered 2013-01-09: 120 mg via INTRAVENOUS

## 2013-01-09 MED ORDER — NEOSTIGMINE METHYLSULFATE 1 MG/ML IJ SOLN
INTRAMUSCULAR | Status: DC | PRN
  Administered 2013-01-09: 5 mg via INTRAVENOUS

## 2013-01-09 MED ORDER — OXYCODONE HCL ER 10 MG PO T12A
10.0000 mg | EXTENDED_RELEASE_TABLET | Freq: Two times a day (BID) | ORAL | Status: DC
  Administered 2013-01-09 – 2013-01-12 (×7): 10 mg via ORAL
  Filled 2013-01-09 (×7): qty 1

## 2013-01-09 MED ORDER — LACTATED RINGERS IV SOLN: INTRAVENOUS | Status: DC

## 2013-01-09 MED ORDER — CEFAZOLIN SODIUM-DEXTROSE 2-3 GM-% IV SOLR
2.0000 g | INTRAVENOUS | Status: AC
  Administered 2013-01-09: 2 g via INTRAVENOUS

## 2013-01-09 MED ORDER — GLYCOPYRROLATE 0.2 MG/ML IJ SOLN
INTRAMUSCULAR | Status: DC | PRN
  Administered 2013-01-09: 0.6 mg via INTRAVENOUS

## 2013-01-09 MED ORDER — INSULIN ASPART 100 UNIT/ML ~~LOC~~ SOLN
0.0000 [IU] | Freq: Three times a day (TID) | SUBCUTANEOUS | Status: DC
  Administered 2013-01-10: 3 [IU] via SUBCUTANEOUS
  Administered 2013-01-10: 2 [IU] via SUBCUTANEOUS

## 2013-01-09 MED ORDER — METFORMIN HCL 500 MG PO TABS
1000.0000 mg | ORAL_TABLET | Freq: Two times a day (BID) | ORAL | Status: DC
  Administered 2013-01-09 – 2013-01-12 (×6): 1000 mg via ORAL
  Filled 2013-01-09 (×8): qty 2

## 2013-01-09 MED ORDER — HYDROMORPHONE HCL PF 1 MG/ML IJ SOLN
0.2500 mg | INTRAMUSCULAR | Status: DC | PRN
  Administered 2013-01-09 (×4): 0.5 mg via INTRAVENOUS

## 2013-01-09 MED ORDER — ACETAMINOPHEN 650 MG RE SUPP: 650.0000 mg | Freq: Four times a day (QID) | RECTAL | Status: DC | PRN

## 2013-01-09 MED ORDER — SODIUM CHLORIDE 0.9 % IV SOLN
INTRAVENOUS | Status: DC
  Administered 2013-01-09: 75 mL/h via INTRAVENOUS
  Administered 2013-01-11: 15:00:00 via INTRAVENOUS

## 2013-01-09 MED ORDER — DEXAMETHASONE SODIUM PHOSPHATE 4 MG/ML IJ SOLN
INTRAMUSCULAR | Status: DC | PRN
  Administered 2013-01-09: 4 mg via INTRAVENOUS

## 2013-01-09 MED ORDER — ONDANSETRON HCL 4 MG/2ML IJ SOLN
INTRAMUSCULAR | Status: DC | PRN
  Administered 2013-01-09: 4 mg via INTRAVENOUS

## 2013-01-09 MED ORDER — OXYCODONE HCL 5 MG PO TABS
5.0000 mg | ORAL_TABLET | ORAL | Status: DC | PRN
  Administered 2013-01-09: 10 mg via ORAL
  Administered 2013-01-09: 5 mg via ORAL
  Administered 2013-01-09 (×2): 10 mg via ORAL
  Administered 2013-01-09 – 2013-01-10 (×2): 5 mg via ORAL
  Administered 2013-01-10 – 2013-01-12 (×13): 10 mg via ORAL
  Filled 2013-01-09 (×8): qty 2
  Filled 2013-01-09: qty 1
  Filled 2013-01-09 (×4): qty 2
  Filled 2013-01-09: qty 1
  Filled 2013-01-09 (×5): qty 2

## 2013-01-09 MED ORDER — ACETAMINOPHEN 10 MG/ML IV SOLN
INTRAVENOUS | Status: DC | PRN
  Administered 2013-01-09: 1000 mg via INTRAVENOUS

## 2013-01-09 MED ORDER — LACTATED RINGERS IV SOLN
INTRAVENOUS | Status: DC
  Administered 2013-01-09: 1000 mL via INTRAVENOUS

## 2013-01-09 MED ORDER — ALUM & MAG HYDROXIDE-SIMETH 200-200-20 MG/5ML PO SUSP: 30.0000 mL | ORAL | Status: DC | PRN

## 2013-01-09 MED ORDER — HYDROMORPHONE HCL PF 1 MG/ML IJ SOLN
1.0000 mg | INTRAMUSCULAR | Status: DC | PRN
  Administered 2013-01-09 – 2013-01-10 (×4): 1 mg via INTRAVENOUS
  Filled 2013-01-09 (×4): qty 1

## 2013-01-09 MED ORDER — INSULIN ASPART 100 UNIT/ML ~~LOC~~ SOLN: 0.0000 [IU] | Freq: Every day | SUBCUTANEOUS | Status: DC

## 2013-01-09 MED ORDER — SIMVASTATIN 20 MG PO TABS
20.0000 mg | ORAL_TABLET | Freq: Every day | ORAL | Status: DC
  Administered 2013-01-09 – 2013-01-11 (×3): 20 mg via ORAL
  Filled 2013-01-09 (×4): qty 1

## 2013-01-09 MED ORDER — METOCLOPRAMIDE HCL 5 MG/ML IJ SOLN
INTRAMUSCULAR | Status: DC | PRN
  Administered 2013-01-09: 10 mg via INTRAVENOUS

## 2013-01-09 MED ORDER — RIVAROXABAN 10 MG PO TABS
10.0000 mg | ORAL_TABLET | Freq: Every day | ORAL | Status: DC
  Filled 2013-01-09: qty 1

## 2013-01-09 MED ORDER — ONDANSETRON HCL 4 MG/2ML IJ SOLN
4.0000 mg | Freq: Four times a day (QID) | INTRAMUSCULAR | Status: DC | PRN
  Administered 2013-01-10: 4 mg via INTRAVENOUS
  Filled 2013-01-09: qty 2

## 2013-01-09 MED ORDER — LACTATED RINGERS IV SOLN
INTRAVENOUS | Status: DC | PRN
  Administered 2013-01-09 (×2): via INTRAVENOUS

## 2013-01-09 MED ORDER — RIVAROXABAN 10 MG PO TABS
10.0000 mg | ORAL_TABLET | Freq: Every day | ORAL | Status: DC
  Administered 2013-01-10 – 2013-01-12 (×3): 10 mg via ORAL
  Filled 2013-01-09 (×4): qty 1

## 2013-01-09 MED ORDER — STERILE WATER FOR IRRIGATION IR SOLN
Status: DC | PRN
  Administered 2013-01-09 (×2): 1500 mL

## 2013-01-09 MED ORDER — FUROSEMIDE 40 MG PO TABS
40.0000 mg | ORAL_TABLET | Freq: Every day | ORAL | Status: DC
  Administered 2013-01-09 – 2013-01-12 (×4): 40 mg via ORAL
  Filled 2013-01-09 (×4): qty 1

## 2013-01-09 MED ORDER — METOCLOPRAMIDE HCL 5 MG/ML IJ SOLN: 5.0000 mg | Freq: Three times a day (TID) | INTRAMUSCULAR | Status: DC | PRN

## 2013-01-09 MED ORDER — PHENOL 1.4 % MT LIQD: 1.0000 | OROMUCOSAL | Status: DC | PRN

## 2013-01-09 MED ORDER — METOCLOPRAMIDE HCL 5 MG PO TABS
5.0000 mg | ORAL_TABLET | Freq: Three times a day (TID) | ORAL | Status: DC | PRN
  Filled 2013-01-09: qty 2

## 2013-01-09 MED ORDER — KETAMINE HCL 10 MG/ML IJ SOLN
INTRAMUSCULAR | Status: DC | PRN
  Administered 2013-01-09 (×5): 1 mg via INTRAVENOUS

## 2013-01-09 SURGICAL SUPPLY — 58 items
ADH SKN CLS APL DERMABOND .7 (GAUZE/BANDAGES/DRESSINGS) ×1
BAG SPEC THK2 15X12 ZIP CLS (MISCELLANEOUS) ×1
BAG ZIPLOCK 12X15 (MISCELLANEOUS) ×2 IMPLANT
BANDAGE ELASTIC 6 VELCRO ST LF (GAUZE/BANDAGES/DRESSINGS) ×2 IMPLANT
BANDAGE ESMARK 6X9 LF (GAUZE/BANDAGES/DRESSINGS) ×1 IMPLANT
BLADE SAG 18X100X1.27 (BLADE) ×2 IMPLANT
BLADE SAW SGTL 13.0X1.19X90.0M (BLADE) ×2 IMPLANT
BNDG CMPR 9X6 STRL LF SNTH (GAUZE/BANDAGES/DRESSINGS) ×1
BNDG ESMARK 6X9 LF (GAUZE/BANDAGES/DRESSINGS) ×2
BOWL SMART MIX CTS (DISPOSABLE) ×2 IMPLANT
CEMENT HV SMART SET (Cement) ×4 IMPLANT
CLOTH BEACON ORANGE TIMEOUT ST (SAFETY) ×2 IMPLANT
CLSR STERI-STRIP ANTIMIC 1/2X4 (GAUZE/BANDAGES/DRESSINGS) ×4 IMPLANT
CUFF TOURN SGL QUICK 34 (TOURNIQUET CUFF) ×2
CUFF TRNQT CYL 34X4X40X1 (TOURNIQUET CUFF) ×1 IMPLANT
DERMABOND ADVANCED (GAUZE/BANDAGES/DRESSINGS) ×1
DERMABOND ADVANCED .7 DNX12 (GAUZE/BANDAGES/DRESSINGS) IMPLANT
DRAPE EXTREMITY T 121X128X90 (DRAPE) ×2 IMPLANT
DRAPE LG THREE QUARTER DISP (DRAPES) ×2 IMPLANT
DRAPE POUCH INSTRU U-SHP 10X18 (DRAPES) ×2 IMPLANT
DRAPE U-SHAPE 47X51 STRL (DRAPES) ×2 IMPLANT
DRSG PAD ABDOMINAL 8X10 ST (GAUZE/BANDAGES/DRESSINGS) ×2 IMPLANT
DURAPREP 26ML APPLICATOR (WOUND CARE) ×2 IMPLANT
ELECT REM PT RETURN 9FT ADLT (ELECTROSURGICAL) ×2
ELECTRODE REM PT RTRN 9FT ADLT (ELECTROSURGICAL) ×1 IMPLANT
EVACUATOR 1/8 PVC DRAIN (DRAIN) ×2 IMPLANT
FACESHIELD LNG OPTICON STERILE (SAFETY) ×10 IMPLANT
GAUZE XEROFORM 5X9 LF (GAUZE/BANDAGES/DRESSINGS) ×2 IMPLANT
GLOVE BIO SURGEON STRL SZ7.5 (GLOVE) ×2 IMPLANT
GLOVE BIOGEL PI IND STRL 8 (GLOVE) ×2 IMPLANT
GLOVE BIOGEL PI INDICATOR 8 (GLOVE) ×2
GLOVE ECLIPSE 8.0 STRL XLNG CF (GLOVE) ×2 IMPLANT
GOWN STRL REIN XL XLG (GOWN DISPOSABLE) ×4 IMPLANT
HANDPIECE INTERPULSE COAX TIP (DISPOSABLE) ×2
IMMOBILIZER KNEE 20 (SOFTGOODS)
IMMOBILIZER KNEE 20 THIGH 36 (SOFTGOODS) IMPLANT
KIT BASIN OR (CUSTOM PROCEDURE TRAY) ×2 IMPLANT
NS IRRIG 1000ML POUR BTL (IV SOLUTION) ×2 IMPLANT
PACK TOTAL JOINT (CUSTOM PROCEDURE TRAY) ×2 IMPLANT
PADDING CAST COTTON 6X4 STRL (CAST SUPPLIES) ×4 IMPLANT
POSITIONER SURGICAL ARM (MISCELLANEOUS) ×2 IMPLANT
SET HNDPC FAN SPRY TIP SCT (DISPOSABLE) ×1 IMPLANT
SET PAD KNEE POSITIONER (MISCELLANEOUS) ×2 IMPLANT
SPONGE GAUZE 4X4 12PLY (GAUZE/BANDAGES/DRESSINGS) ×2 IMPLANT
SPONGE LAP 18X18 X RAY DECT (DISPOSABLE) ×2 IMPLANT
SUCTION FRAZIER 12FR DISP (SUCTIONS) ×2 IMPLANT
SUT MNCRL AB 4-0 PS2 18 (SUTURE) ×2 IMPLANT
SUT VIC AB 0 CT1 27 (SUTURE) ×2
SUT VIC AB 0 CT1 27XBRD ANTBC (SUTURE) ×2 IMPLANT
SUT VIC AB 1 CT1 27 (SUTURE) ×2
SUT VIC AB 1 CT1 27XBRD ANTBC (SUTURE) ×3 IMPLANT
SUT VIC AB 2-0 CT1 27 (SUTURE) ×6
SUT VIC AB 2-0 CT1 TAPERPNT 27 (SUTURE) ×2 IMPLANT
TOWEL OR 17X26 10 PK STRL BLUE (TOWEL DISPOSABLE) ×4 IMPLANT
TOWEL OR NON WOVEN STRL DISP B (DISPOSABLE) ×2 IMPLANT
TRAY FOLEY CATH 14FRSI W/METER (CATHETERS) ×2 IMPLANT
WATER STERILE IRR 1500ML POUR (IV SOLUTION) ×3 IMPLANT
WRAP KNEE MAXI GEL POST OP (GAUZE/BANDAGES/DRESSINGS) ×2 IMPLANT

## 2013-01-09 NOTE — H&P (Signed)
TOTAL KNEE ADMISSION H&P  Patient is being admitted for left total knee arthroplasty.  Subjective:  Chief Complaint:left knee pain.  HPI: Renee Watts, 62 y.o. female, has a history of pain and functional disability in the left knee due to arthritis and has failed non-surgical conservative treatments for greater than 12 weeks to includeNSAID's and/or analgesics, corticosteriod injections, use of assistive devices and activity modification.  Onset of symptoms was gradual, starting 5 years ago with gradually worsening course since that time. The patient noted no past surgery on the left knee(s).  Patient currently rates pain in the left knee(s) at 7 out of 10 with activity. Patient has night pain, worsening of pain with activity and weight bearing, pain that interferes with activities of daily living, pain with passive range of motion, crepitus and joint swelling.  Patient has evidence of subchondral sclerosis, periarticular osteophytes and joint space narrowing by imaging studies. There is no active infection.  Patient Active Problem List   Diagnosis Date Noted  . Arthritis of knee, left 01/09/2013  . S/P total hip arthroplasty 10/30/2012  . Diabetic peripheral neuropathy 09/01/2012  . Health care maintenance 09/01/2012  . Shingles rash 07/10/2012  . Bursitis of hip, right 05/07/2012  . Blood in stool 03/11/2012  . Pain, joint, pelvic region or thigh, right 07/24/2011  . HYPERLIPIDEMIA 09/16/2006  . HYPERTENSION 09/16/2006  . GERD 09/16/2006  . DIABETES MELLITUS, TYPE II 06/17/2006  . OSTEOARTHRITIS 06/17/2006  . DEPENDENT EDEMA, LEGS, BILATERAL 06/17/2006   Past Medical History  Diagnosis Date  . Hyperlipidemia   . Hypertension   . GERD (gastroesophageal reflux disease)   . Depression   . Arthritis   . Low back pain   . Osteoarthritis, knee     Bilateral, s/p 3 total knee replacement surgerues on right, last 2005. Dr. Shelle Iron  . Pellegrini-Steida syndrome     Myositis  ossificans  . Obesity   . Dependent edema     Bilateral  . KNEE REPLACEMENT, HX OF 06/17/2006    Annotation: Right multiple re-do's 2003 & 2004 Qualifier: Diagnosis of  By: Candis Musa MD, Ruben Reason.   . HYSTERECTOMY, HX OF 06/17/2006    Annotation: Secondary to Fibroids Qualifier: Diagnosis of  By: Candis Musa MD, Ruben Reason TONSILLECTOMY, HX OF 06/17/2006    Qualifier: Diagnosis of  By: Candis Musa MD, Veronique D.   . Diabetes mellitus     ORAL MEDICATION  . Shingles Jul 10 2012    LESIONS CLEARED BUT NOT TOLERATING ANY THING TIGHT AGAINST BODY--SHINGLES WERE AROSS BACK AND ABDOMEN  . Seasonal allergies     Past Surgical History  Procedure Laterality Date  . Tubal ligation    . Abdominal hysterectomy  1984  . Tonsillectomy    . Total knee arthroplasty Right     with multiple revisions, 2003, 2005  . Joint replacement    . Total hip arthroplasty Right 10/10/2012    Procedure: R TOTAL HIP ARTHROPLASTY ANTERIOR APPROACH;  Surgeon: Kathryne Hitch, MD;  Location: [facility];  Service: Orthopedics;  Laterality: Right;  Right Total Hip Arthroplasty, Anterior Approach (C-Arm)  . Colonoscopy      Prescriptions prior to admission  Medication Sig Dispense Refill  . furosemide (LASIX) 40 MG tablet Take 40 mg by mouth daily.      Marland Kitchen gabapentin (NEURONTIN) 100 MG capsule Take 300 mg by mouth at bedtime.      Marland Kitchen lisinopril (PRINIVIL,ZESTRIL) 20 MG tablet Take 20 mg by mouth every  evening.      . metFORMIN (GLUCOPHAGE) 1000 MG tablet Take 1,000 mg by mouth 2 (two) times daily with a meal.      . pravastatin (PRAVACHOL) 40 MG tablet Take 40 mg by mouth every evening.      Marland Kitchen omeprazole (PRILOSEC) 10 MG capsule Take 10 mg by mouth daily as needed.       No Known Allergies  History  Substance Use Topics  . Smoking status: Never Smoker   . Smokeless tobacco: Never Used  . Alcohol Use: No    Family History  Problem Relation Age of Onset  . Diabetes Mother   . Hypertension Mother   .  Hypertension Father   . Cancer Father     Lung CA  . Coronary artery disease Father      Review of Systems  Musculoskeletal: Positive for joint pain.  All other systems reviewed and are negative.    Objective:  Physical Exam  Constitutional: She is oriented to person, place, and time. She appears well-developed and well-nourished.  HENT:  Head: Normocephalic and atraumatic.  Eyes: EOM are normal. Pupils are equal, round, and reactive to light.  Neck: Normal range of motion. Neck supple.  Cardiovascular: Normal rate and regular rhythm.   Respiratory: Effort normal and breath sounds normal.  GI: Soft. Bowel sounds are normal.  Musculoskeletal:       Left knee: She exhibits decreased range of motion and effusion. Tenderness found. Medial joint line and lateral joint line tenderness noted.  Neurological: She is alert and oriented to person, place, and time.  Skin: Skin is warm and dry.  Psychiatric: She has a normal mood and affect.    Vital signs in last 24 hours: Temp:  [98.2 F (36.8 C)] 98.2 F (36.8 C) (05/16 0541) Pulse Rate:  [68] 68 (05/16 0541) Resp:  [18] 18 (05/16 0541) BP: (126)/(68) 126/68 mmHg (05/16 0541) SpO2:  [98 %] 98 % (05/16 0541)  Labs:   Estimated body mass index is 28.78 kg/(m^2) as calculated from the following:   Height as of 01/05/13: 5\' 6"  (1.676 m).   Weight as of 10/30/12: 80.831 kg (178 lb 3.2 oz).   Imaging Review Plain radiographs demonstrate moderate degenerative joint disease of the left knee(s). The overall alignment ismild varus. The bone quality appears to be good for age and reported activity level.  Assessment/Plan:  End stage arthritis, left knee   The patient history, physical examination, clinical judgment of the provider and imaging studies are consistent with end stage degenerative joint disease of the left knee(s) and total knee arthroplasty is deemed medically necessary. The treatment options including medical management,  injection therapy arthroscopy and arthroplasty were discussed at length. The risks and benefits of total knee arthroplasty were presented and reviewed. The risks due to aseptic loosening, infection, stiffness, patella tracking problems, thromboembolic complications and other imponderables were discussed. The patient acknowledged the explanation, agreed to proceed with the plan and consent was signed. Patient is being admitted for inpatient treatment for surgery, pain control, PT, OT, prophylactic antibiotics, VTE prophylaxis, progressive ambulation and ADL's and discharge planning. The patient is planning to be discharged to skilled nursing facility

## 2013-01-09 NOTE — Anesthesia Postprocedure Evaluation (Signed)
Anesthesia Post Note  Patient: Renee Watts  Procedure(s) Performed: Procedure(s) (LRB): LEFT TOTAL KNEE ARTHROPLASTY (Left)  Anesthesia type: General  Patient location: PACU  Post pain: Pain level controlled  Post assessment: Post-op Vital signs reviewed  Last Vitals:  Filed Vitals:   01/09/13 1228  BP: 173/82  Pulse: 61  Temp: 36.9 C  Resp: 16    Post vital signs: Reviewed  Level of consciousness: sedated  Complications: No apparent anesthesia complications

## 2013-01-09 NOTE — Transfer of Care (Signed)
Immediate Anesthesia Transfer of Care Note  Patient: Renee Watts  Procedure(s) Performed: Procedure(s) (LRB): LEFT TOTAL KNEE ARTHROPLASTY (Left)  Patient Location: PACU  Anesthesia Type: General  Level of Consciousness: sedated, patient cooperative and responds to stimulaton  Airway & Oxygen Therapy: Patient Spontanous Breathing and Patient connected to face mask oxgen  Post-op Assessment: Report given to PACU RN and Post -op Vital signs reviewed and stable  Post vital signs: Reviewed and stable  Complications: No apparent anesthesia complications

## 2013-01-09 NOTE — Progress Notes (Signed)
Patient presents from PACU with SBP >190. Patient sleepy and having trouble keeping eyes open. Patient states pain is a 9 on numeric pain scale. Dr. Magnus Ivan notified about BP. No new orders given.

## 2013-01-09 NOTE — Brief Op Note (Signed)
01/09/2013  9:16 AM  PATIENT:  Renee Watts  62 y.o. female  PRE-OPERATIVE DIAGNOSIS:  Severe osteoarthritis left knee  POST-OPERATIVE DIAGNOSIS:  Severe osteoarthritis left knee  PROCEDURE:  Procedure(s): LEFT TOTAL KNEE ARTHROPLASTY (Left)  SURGEON:  Surgeon(s) and Role:    * Kathryne Hitch, MD - Primary  PHYSICIAN ASSISTANT: Rexene Edison, PA-C  ANESTHESIA:   regional and general  EBL:  Total I/O In: 1000 [I.V.:1000] Out: 300 [Urine:300]  BLOOD ADMINISTERED:none  DRAINS: (medium) Hemovact drain(s) in the knee joint with  Suction Open   LOCAL MEDICATIONS USED:  NONE  SPECIMEN:  No Specimen  DISPOSITION OF SPECIMEN:  N/A  COUNTS:  YES  TOURNIQUET:   Total Tourniquet Time Documented: Thigh (Left) - 50 minutes Total: Thigh (Left) - 50 minutes   DICTATION: .Other Dictation: Dictation Number [id number]  PLAN OF CARE: Admit to inpatient   PATIENT DISPOSITION:  PACU - hemodynamically stable.   Delay start of Pharmacological VTE agent (>24hrs) due to surgical blood loss or risk of bleeding: no

## 2013-01-09 NOTE — Progress Notes (Signed)
Utilization review completed.  

## 2013-01-09 NOTE — Preoperative (Signed)
Beta Blockers   Reason not to administer Beta Blockers:Not Applicable, not on home BB 

## 2013-01-10 LAB — GLUCOSE, CAPILLARY
Glucose-Capillary: 128 mg/dL — ABNORMAL HIGH (ref 70–99)
Glucose-Capillary: 133 mg/dL — ABNORMAL HIGH (ref 70–99)

## 2013-01-10 LAB — BASIC METABOLIC PANEL
Calcium: 9.3 mg/dL (ref 8.4–10.5)
Creatinine, Ser: 0.37 mg/dL — ABNORMAL LOW (ref 0.50–1.10)
GFR calc Af Amer: >90 mL/min (ref >90)
GFR calc non Af Amer: >90 mL/min (ref >90)

## 2013-01-10 LAB — CBC
MCH: 26.8 pg (ref 26.0–34.0)
MCHC: 33.6 g/dL (ref 30.0–36.0)
MCV: 79.7 fL (ref 78.0–100.0)
Platelets: 185 10*3/uL (ref 150–400)
RDW: 14.1 % (ref 11.5–15.5)

## 2013-01-10 MED ORDER — METHOCARBAMOL 500 MG PO TABS
500.0000 mg | ORAL_TABLET | Freq: Four times a day (QID) | ORAL | Status: DC | PRN
  Administered 2013-01-10 – 2013-01-12 (×8): 500 mg via ORAL
  Filled 2013-01-10 (×8): qty 1

## 2013-01-10 MED ORDER — POLYETHYLENE GLYCOL 3350 17 G PO PACK
17.0000 g | PACK | Freq: Two times a day (BID) | ORAL | Status: DC
  Administered 2013-01-10 – 2013-01-11 (×3): 17 g via ORAL

## 2013-01-10 NOTE — Progress Notes (Signed)
Physical Therapy Treatment Patient Details Name: Renee Watts MRN: [id number] DOB: Jun 04, 1951 Today's Date: 01/10/2013 Time: 6213-0865 PT Time Calculation (min): 23 min  PT Assessment / Plan / Recommendation Comments on Treatment Session  Pt ambulated in hall until c/o dizziness.  Transported back to room in chair and assisted to bed.    Follow Up Recommendations  Home health PT     Does the patient have the potential to tolerate intense rehabilitation     Barriers to Discharge        Equipment Recommendations  None recommended by PT    Recommendations for Other Services OT consult  Frequency 7X/week   Plan Discharge plan remains appropriate    Precautions / Restrictions Precautions Precautions: Knee Required Braces or Orthoses: Knee Immobilizer - Left Knee Immobilizer - Left: Discontinue once straight leg raise with < 10 degree lag Restrictions Weight Bearing Restrictions: No Other Position/Activity Restrictions: WBAT   Pertinent Vitals/Pain 6/10 at end of session    Mobility  Bed Mobility Bed Mobility: Sit to Supine Sit to Supine: 4: Min assist;3: Mod assist Details for Bed Mobility Assistance: cues for sequence and use of R LE to self assist Transfers Transfers: Sit to Stand;Stand to Sit Sit to Stand: 3: Mod assist;With upper extremity assist;With armrests;From chair/3-in-1 Stand to Sit: 3: Mod assist;With armrests;Without upper extremity assist;To bed;To chair/3-in-1 Details for Transfer Assistance: cues for LE position and use of UEs to self assist - pt limited by ROM loss at R knee Ambulation/Gait Ambulation/Gait Assistance: 3: Mod assist Ambulation Distance (Feet): 28 Feet (and 4) Assistive device: Rolling walker Ambulation/Gait Assistance Details: cues for posture, stride length and position from RW Gait Pattern: Step-to pattern;Decreased step length - right;Antalgic Gait velocity: slow General Gait Details: Pt ltd by c/o dizziness and fatigue Stairs:  No    Exercises     PT Diagnosis:    PT Problem List:   PT Treatment Interventions:     PT Goals Acute Rehab PT Goals PT Goal Formulation: With patient Time For Goal Achievement: 01/10/13 Potential to Achieve Goals: Good Pt will go Supine/Side to Sit: with supervision PT Goal: Supine/Side to Sit - Progress: Goal set today Pt will go Sit to Supine/Side: with supervision PT Goal: Sit to Supine/Side - Progress: Goal set today Pt will go Sit to Stand: with supervision PT Goal: Sit to Stand - Progress: Goal set today Pt will go Stand to Sit: with supervision PT Goal: Stand to Sit - Progress: Goal set today Pt will Ambulate: 51 - 150 feet;with supervision;with rolling walker PT Goal: Ambulate - Progress: Goal set today Pt will Go Up / Down Stairs: 1-2 stairs;with least restrictive assistive device;with min assist PT Goal: Up/Down Stairs - Progress: Goal set today  Visit Information  Last PT Received On: 01/10/13 Assistance Needed: +1    Subjective Data  Subjective: I'm ready to get back to bed Patient Stated Goal: Home   Cognition  Cognition Arousal/Alertness: Awake/alert Behavior During Therapy: WFL for tasks assessed/performed Overall Cognitive Status: Within Functional Limits for tasks assessed    Balance     End of Session PT - End of Session Equipment Utilized During Treatment: Gait belt;Left knee immobilizer Activity Tolerance: Patient limited by fatigue;Patient limited by pain Patient left: in bed;with call bell/phone within reach;with family/visitor present Nurse Communication: Mobility status CPM Left Knee CPM Left Knee: On   GP     Renee Watts 01/10/2013, 5:25 PM

## 2013-01-10 NOTE — Op Note (Signed)
NAMENICKY, MILHOUSE NO.:  [id number]  MEDICAL RECORD NO.:  [id number]  LOCATION:  1614                         FACILITY:  [facility]  PHYSICIAN:  Vanita Panda. Magnus Ivan, M.D.DATE OF BIRTH:  [birth year]  DATE OF PROCEDURE:  01/09/2013 DATE OF DISCHARGE:                              OPERATIVE REPORT   PREOPERATIVE DIAGNOSIS:  Severe end-stage arthritis and degenerative joint disease of the left knee.  POSTOPERATIVE DIAGNOSES:  Severe end-stage arthritis and degenerative joint disease of the left knee.  PROCEDURE:  Left total knee arthroplasty.  IMPLANTS:  Stryker Triathlon knee with size 3 femur, size 4 tibial tray, 11-mm polyethylene insert, size 32 patellar button.  SURGEON:  Vanita Panda. Magnus Ivan, M.D.  ASSISTING:  Richardean Canal, P.A.  ANESTHESIA: 1. Left leg femoral nerve block. 2. General.  BLOOD LOSS:  Less than 200 mL.  TOURNIQUET TIME:  Less than 15 minutes.  COMPLICATIONS:  None.  INDICATIONS:  Ms. Riso is a 62 year old female with severe end-stage arthritis of her left knee.  Her x-rays show varus deformity with severe arthritic changes.  There is medial joint space narrowing with almost complete loss of the joint space.  There are subchondral sclerosis and periarticular osteophyte and cystic changes.  She has a failure of conservative treatment including injections, walking with assisted device and activity modification.  At this point, she wished to proceed with a total knee arthroplasty due to her daily pain, the severity of her pain, her decreased mobility and her decreased quality of life.  The risks are acute blood loss anemia, nerve and vessel injury, DVT, infection and fracture.  The goals are to improve mobility, decrease pain and improve quality of life.  PROCEDURE DESCRIPTION:  After informed consent was obtained, appropriate left leg was marked, anesthesia obtained in the femoral nerve block. She was brought to the  operating room and placed supine on the operating table.  General anesthesia was then obtained.  A nonsterile tourniquet was placed around her upper left leg.  The left leg was prepped from the thigh down the toes with DuraPrep and sterile drapes including a sterile stockinette.  A time-out was called and she was identified as correct patient and correct left knee.  I then used an Esmarch to wrap out the leg and tourniquet was inflated to 300 mm of pressure.  We then made a midline incision directly over the patella and carried this proximally and distally.  We dissected down to the knee joint and performed a medial parapatellar arthrotomy.  We then flexed the knee and everted the patella.  We found significant osteophytes and complete loss of cartilage throughout her knee.  There was hard sclerotic bone as well. We removed osteophytes from the knee as well as remnants of the ACL, PCL, medial and lateral meniscus.  Next, with the knee flexed, we used the external tibial referencing and cutting guide to place our tibial block and cutting about 9 mm off the high side of the tibia and correcting her varus and valgus with a negative slope.  We made our tibial cut.  Once we made our tibial cut, we turned direction to the femur.  We used intramedullary guide to the  femur with a drill to hole in the intercondylar area and then placed a 5-degree externally rotate cutting guide for taking 10 mm off the distal femoral cut.  We made our distal femoral cut and then brought our knee back down an extension.  We placed a 9-mm extension block and she just hyperextended barely.  We then cleaned the knee of further debris and went back to the femur.  We placed the femoral sizing guide based off the epicondylar axis and tapped this into place.  We chose a size 3 tibia.  We then did our anterior and posterior cuts as well as our chamfer cuts after we placed our 4-in-1 cutting block for a size 3 left femur.   We then made our femoral box cut.  Attention was then turned to the tibia.  We trialed a size 3 tibia, but I felt that 4 was covered the tibial plateau better. We based this off of the tibial tubercle and just medial to this setting the rotation.  We placed the trial and then cut the keel for this.  With the trial components in place, put the knee through range of motion using a 9-mm polyethylene followed by 11 and I felt the 11 give her more stability.  We then made our patellar cut and placed drill holes for a 32 patella.  I removed all trial instrumentation and copiously irrigated the knee with normal saline solution using pulsatile lavage.  We then cemented the real Stryker Triathlon tibial tray size 4 followed by the real size 3 femur.  We placed the real fix-bearing 11-mm polyethylene insert and cemented the 32 patellar button.  Once the cement had hardened, we let the tourniquet down and hemostasis was obtained with electrocautery, placed a medium Hemovac in the arthrotomy and closed the arthrotomy with interrupted #1 Vicryl suture followed by 2-0 Vicryl in the subcutaneous tissue, 4-0 Monocryl subcuticular stitch, and Dermabond on the skin.  Well-padded sterile dressing was applied.  She was awakened, extubated, and taken to recovery room in stable condition. All final counts were correct.  There were no complications noted.  Of note, Richardean Canal, [facility] was present for the entire case and his presence was crucial for exposure to the knee joint, assisting with placement of components and closure of the wound.     Vanita Panda. Magnus Ivan, M.D.     CYB/MEDQ  D:  01/09/2013  T:  01/09/2013  Job:  [id number]

## 2013-01-10 NOTE — Care Management Note (Signed)
Cm spoke with patient at bedside with adult daughter present concerning discharge planning. Pt offered choice for [facility] agencies in [place]. Per pt choice [facility] to provide [facility] services upon discharge. Pt request a specific physical therapist whose name is Reggie. Cherly Anderson notified of referral and pt's request at [phone]. Pt states having RW & BSC at home from previous surgery. No other needs identified. Family to assist in home care. MD orders entered.   Roxy Manns Lindsey Hommel,RN,BSN [phone]

## 2013-01-10 NOTE — Evaluation (Signed)
Physical Therapy Evaluation Patient Details Name: Renee Watts MRN: [id number] DOB: 16-Jul-1951 Today's Date: 01/10/2013 Time: 1012-1050 PT Time Calculation (min): 38 min  PT Assessment / Plan / Recommendation Clinical Impression  Pt s/p L TKR presents with decreased L LE strength/ROM, decreased R knee ROM and post op pain limiting functional mobility    PT Assessment  Patient needs continued PT services    Follow Up Recommendations  Home health PT    Does the patient have the potential to tolerate intense rehabilitation      Barriers to Discharge None      Equipment Recommendations  None recommended by PT    Recommendations for Other Services OT consult   Frequency 7X/week    Precautions / Restrictions Precautions Precautions: Knee Required Braces or Orthoses: Knee Immobilizer - Left Knee Immobilizer - Left: Discontinue once straight leg raise with < 10 degree lag Restrictions Weight Bearing Restrictions: No Other Position/Activity Restrictions: WBAT   Pertinent Vitals/Pain 6/10 at session end, pt premedicated, cold packs provided      Mobility  Bed Mobility Bed Mobility: Supine to Sit Supine to Sit: 3: Mod assist Details for Bed Mobility Assistance: cues for sequence and use of R LE to self assist Transfers Transfers: Sit to Stand;Stand to Sit Sit to Stand: 3: Mod assist Stand to Sit: 3: Mod assist Details for Transfer Assistance: cues for LE position and use of UEs to self assist - pt limited by ROM loss at R knee Ambulation/Gait Ambulation/Gait Assistance: 3: Mod assist Ambulation Distance (Feet): 22 Feet Assistive device: Rolling walker Ambulation/Gait Assistance Details: cues for sequence, posture and position from RW Gait Pattern: Step-to pattern;Decreased step length - right;Antalgic Gait velocity: slow Stairs: No    Exercises Total Joint Exercises Ankle Circles/Pumps: AROM;10 reps;Supine;Both Quad Sets: AROM;Both;10 reps;Supine Heel Slides:  AAROM;10 reps;Supine;Left Straight Leg Raises: AAROM;Left;10 reps;Supine   PT Diagnosis: Difficulty walking  PT Problem List: Decreased strength;Decreased range of motion;Decreased activity tolerance;Decreased mobility;Decreased knowledge of use of DME;Pain PT Treatment Interventions: DME instruction;Gait training;Stair training;Functional mobility training;Therapeutic activities;Therapeutic exercise;Patient/family education   PT Goals Acute Rehab PT Goals PT Goal Formulation: With patient Time For Goal Achievement: 01/10/13 Potential to Achieve Goals: Good Pt will go Supine/Side to Sit: with supervision PT Goal: Supine/Side to Sit - Progress: Goal set today Pt will go Sit to Supine/Side: with supervision PT Goal: Sit to Supine/Side - Progress: Goal set today Pt will go Sit to Stand: with supervision PT Goal: Sit to Stand - Progress: Goal set today Pt will go Stand to Sit: with supervision PT Goal: Stand to Sit - Progress: Goal set today Pt will Ambulate: 51 - 150 feet;with supervision;with rolling walker PT Goal: Ambulate - Progress: Goal set today Pt will Go Up / Down Stairs: 1-2 stairs;with least restrictive assistive device;with min assist PT Goal: Up/Down Stairs - Progress: Goal set today  Visit Information  Last PT Received On: 01/10/13 Assistance Needed: +1    Subjective Data  Subjective: I know the first step will be the hard one.  I just need to get that in Patient Stated Goal: Home   Prior Functioning  Home Living Lives With: Spouse;Family Available Help at Discharge: Family Type of Home: House Home Access: Stairs to enter Secretary/administrator of Steps: 2 Entrance Stairs-Rails: None Home Layout: One level Home Adaptive Equipment: Walker - rolling Prior Function Level of Independence: Independent;Independent with assistive device(s) Able to Take Stairs?: Yes Driving: Yes Vocation: Full time employment Communication Communication: No difficulties  Cognition  Cognition Arousal/Alertness: Awake/alert Behavior During Therapy: WFL for tasks assessed/performed Overall Cognitive Status: Within Functional Limits for tasks assessed    Extremity/Trunk Assessment Right Upper Extremity Assessment RUE ROM/Strength/Tone: WFL for tasks assessed Left Upper Extremity Assessment LUE ROM/Strength/Tone: WFL for tasks assessed Right Lower Extremity Assessment RLE ROM/Strength/Tone: Deficits RLE ROM/Strength/Tone Deficits: AROM at knee to 80 with strength WFL - pt limited since previous knee surgeries Left Lower Extremity Assessment LLE ROM/Strength/Tone: [facility] for tasks assessed Trunk Assessment Trunk Assessment: Normal   Balance    End of Session PT - End of Session Equipment Utilized During Treatment: Gait belt;Left knee immobilizer Activity Tolerance: Patient tolerated treatment well Patient left: in chair;with call bell/phone within reach Nurse Communication: Mobility status CPM Left Knee CPM Left Knee: Off  GP     Dajae Kizer 01/10/2013, 12:39 PM

## 2013-01-10 NOTE — Progress Notes (Signed)
Subjective: 1 Day Post-Op Procedure(s) (LRB): LEFT TOTAL KNEE ARTHROPLASTY (Left) Patient reports pain as moderate.  No other complaints. About to get up with PT.  Objective: Vital signs in last 24 hours: Temp:  [97.9 F (36.6 C)-99.4 F (37.4 C)] 98.4 F (36.9 C) (05/17 0900) Pulse Rate:  [59-98] 97 (05/17 0900) Resp:  [14-18] 16 (05/17 0900) BP: (131-184)/(67-84) 131/67 mmHg (05/17 0900) SpO2:  [94 %-100 %] 97 % (05/17 0900)  Intake/Output from previous day: 05/16 0701 - 05/17 0700 In: 3330 [I.V.:3330] Out: 4275 [Urine:4225; Blood:50] Intake/Output this shift:     Recent Labs  01/10/13 0547  HGB 10.7*    Recent Labs  01/10/13 0547  WBC 10.2  RBC 3.99  HCT 31.8*  PLT 185    Recent Labs  01/10/13 0547  NA 134*  K 3.7  CL 98  CO2 29  BUN 6  CREATININE 0.37*  GLUCOSE 154*  CALCIUM 9.3   No results found for this basename: LABPT, INR,  in the last 72 hours  Left lower extremity: Neurovascular intact Dorsiflexion/Plantar flexion intact Dressing clean dry and intact  Assessment/Plan: 1 Day Post-Op Procedure(s) (LRB): LEFT TOTAL KNEE ARTHROPLASTY (Left) Up with therapy Hyponatremia mild will monitor ABLA anemia asymptomatic will monitor  Will d/c foley once up with PT and ambulating  Renee Watts 01/10/2013, 10:35 AM

## 2013-01-11 LAB — CBC
HCT: 30.6 % — ABNORMAL LOW (ref 36.0–46.0)
Hemoglobin: 10 g/dL — ABNORMAL LOW (ref 12.0–15.0)
MCH: 26.3 pg (ref 26.0–34.0)
MCV: 80.5 fL (ref 78.0–100.0)
RBC: 3.8 MIL/uL — ABNORMAL LOW (ref 3.87–5.11)

## 2013-01-11 LAB — GLUCOSE, CAPILLARY: Glucose-Capillary: 117 mg/dL — ABNORMAL HIGH (ref 70–99)

## 2013-01-11 MED ORDER — METOPROLOL TARTRATE 25 MG PO TABS
25.0000 mg | ORAL_TABLET | Freq: Once | ORAL | Status: AC
  Administered 2013-01-11: 25 mg via ORAL
  Filled 2013-01-11: qty 1

## 2013-01-11 MED ORDER — SODIUM CHLORIDE 0.9 % IV BOLUS (SEPSIS)
500.0000 mL | Freq: Once | INTRAVENOUS | Status: AC
  Administered 2013-01-11: 500 mL via INTRAVENOUS

## 2013-01-11 NOTE — Progress Notes (Signed)
OT Cancellation Note  Patient Details Name: Renee Watts MRN: [id number] DOB: [birth year]   Cancelled Treatment:    Reason Eval/Treat Not Completed: Patient not medically ready (HR at rest is 130 hold until RN okays for mobility)   Lucile Shutters Pager: [phone]  01/11/2013, 9:27 AM

## 2013-01-11 NOTE — Progress Notes (Signed)
Orthopedic Tech Progress Note Patient Details:  Renee Watts 10/21/1950 [id number] Patient placed in CPM. Tolerated well.  CPM Left Knee CPM Left Knee: On Left Knee Flexion (Degrees): 50 Left Knee Extension (Degrees): 0   Renee Watts 01/11/2013, 5:46 PM

## 2013-01-11 NOTE — Progress Notes (Signed)
PT Cancellation Note  Patient Details Name: MARISELA LINE MRN: [id number] DOB: 09/18/1950   Cancelled Treatment:    Reason Eval/Treat Not Completed: Medical issues which prohibited therapy (HR 132 at rest, RN notified). Will follow.    [name] [name] 01/11/2013, 9:19 AM [phone]

## 2013-01-11 NOTE — Progress Notes (Signed)
Physical Therapy Treatment Patient Details Name: Renee Watts MRN: [id number] DOB: [birth year] Today's Date: 01/11/2013 Time: 4098-1191 PT Time Calculation (min): 40 min  PT Assessment / Plan / Recommendation Comments on Treatment Session  Pt ambulated 45' with RW, distance limited by pain/fatigue. HR was 132 this morning. Pt received Lopressor. Now HR 90 at rest, 115 with walking.     Follow Up Recommendations  Home health PT     Does the patient have the potential to tolerate intense rehabilitation     Barriers to Discharge        Equipment Recommendations  None recommended by PT    Recommendations for Other Services OT consult  Frequency 7X/week   Plan Discharge plan remains appropriate    Precautions / Restrictions Precautions Precautions: Knee Required Braces or Orthoses: Knee Immobilizer - Left Knee Immobilizer - Left: Discontinue once straight leg raise with < 10 degree lag Restrictions Weight Bearing Restrictions: No Other Position/Activity Restrictions: WBAT   Pertinent Vitals/Pain **6/10 L knee with activity Premedicated, ice applied*    Mobility  Bed Mobility Bed Mobility: Sit to Supine Supine to Sit: 4: Min assist Details for Bed Mobility Assistance: cues for sequence and use of R LE to self assist, assist for LLE Transfers Transfers: Sit to Stand;Stand to Sit Sit to Stand: With upper extremity assist;With armrests;From chair/3-in-1;4: Min assist Stand to Sit: With armrests;To chair/3-in-1;4: Min assist;With upper extremity assist Details for Transfer Assistance: cues for LE position and use of UEs to self assist - pt limited by ROM loss at R knee Ambulation/Gait Ambulation/Gait Assistance: 4: Min assist Ambulation Distance (Feet): 45 Feet Assistive device: Rolling walker Gait Pattern: Step-to pattern;Decreased step length - right;Antalgic Gait velocity: slow General Gait Details: Distance limited by HR up to 115 with walking, was 90 at rest. HR had  been 132 at rest this morning.  Stairs: No    Exercises Total Joint Exercises Ankle Circles/Pumps: AROM;10 reps;Supine;Both Quad Sets: AROM;Both;10 reps;Supine Short Arc Quad: AAROM;Left;10 reps;Supine Heel Slides: AAROM;10 reps;Supine;Left Straight Leg Raises: AAROM;Left;10 reps;Supine          L Knee Flexion AAROM 30*, ext 0* PT Diagnosis:    PT Problem List:   PT Treatment Interventions:     PT Goals Acute Rehab PT Goals PT Goal Formulation: With patient Time For Goal Achievement: 01/10/13 Potential to Achieve Goals: Good Pt will go Supine/Side to Sit: with supervision PT Goal: Supine/Side to Sit - Progress: Progressing toward goal Pt will go Sit to Supine/Side: with supervision Pt will go Sit to Stand: with supervision PT Goal: Sit to Stand - Progress: Progressing toward goal Pt will go Stand to Sit: with supervision PT Goal: Stand to Sit - Progress: Progressing toward goal Pt will Ambulate: 51 - 150 feet;with supervision;with rolling walker PT Goal: Ambulate - Progress: Progressing toward goal Pt will Go Up / Down Stairs: 1-2 stairs;with least restrictive assistive device;with min assist  Visit Information  Last PT Received On: 01/11/13 Assistance Needed: +1    Subjective Data  Subjective: I want to walk. This is harder than my hip was.  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

## 2013-01-11 NOTE — Progress Notes (Signed)
Subjective: 2 Days Post-Op Procedure(s) (LRB): LEFT TOTAL KNEE ARTHROPLASTY (Left) Patient reports pain as moderate.  Reports she did not receive pain meds on time last night. Pain currently under control states she is feeling fine this AM. Denies chest pain SOB. No dizziness.  Objective: Vital signs in last 24 hours: Temp:  [99.1 F (37.3 C)-100.3 F (37.9 C)] 99.1 F (37.3 C) (05/18 0540) Pulse Rate:  [113-134] 132 (05/18 0918) Resp:  [14-20] 20 (05/18 0540) BP: (124-135)/(64-76) 124/76 mmHg (05/18 0540) SpO2:  [93 %-94 %] 94 % (05/18 0540)  Intake/Output from previous day: 05/17 0701 - 05/18 0700 In: 0  Out: 950 [Urine:950] Intake/Output this shift:     Recent Labs  01/10/13 0547 01/11/13 0503  HGB 10.7* 10.0*    Recent Labs  01/10/13 0547 01/11/13 0503  WBC 10.2 13.6*  RBC 3.99 3.80*  HCT 31.8* 30.6*  PLT 185 164    Recent Labs  01/10/13 0547  NA 134*  K 3.7  CL 98  CO2 29  BUN 6  CREATININE 0.37*  GLUCOSE 154*  CALCIUM 9.3   No results found for this basename: LABPT, INR,  in the last 72 hours  Neurologically intact Sensation intact distally Intact pulses distally Incision: scant drainage wound benign Left calf non tender   Assessment/Plan: 2 Days Post-Op Procedure(s) (LRB): LEFT TOTAL KNEE ARTHROPLASTY (Left) Up with therapy Monitor tachycardia , EKG HR 121 sinus , one time dose of Lopressor and fluid bolus 500cc.   Richardean Canal 01/11/2013, 9:49 AM

## 2013-01-12 ENCOUNTER — Encounter (HOSPITAL_COMMUNITY): Payer: Self-pay | Admitting: Orthopaedic Surgery

## 2013-01-12 LAB — CBC
HCT: 26.8 % — ABNORMAL LOW (ref 36.0–46.0)
MCH: 26 pg (ref 26.0–34.0)
MCV: 80.2 fL (ref 78.0–100.0)
RBC: 3.34 MIL/uL — ABNORMAL LOW (ref 3.87–5.11)
WBC: 11.5 10*3/uL — ABNORMAL HIGH (ref 4.0–10.5)

## 2013-01-12 LAB — GLUCOSE, CAPILLARY: Glucose-Capillary: 114 mg/dL — ABNORMAL HIGH (ref 70–99)

## 2013-01-12 MED ORDER — METHOCARBAMOL 500 MG PO TABS: 500.0000 mg | ORAL_TABLET | Freq: Four times a day (QID) | ORAL | Status: DC | PRN

## 2013-01-12 MED ORDER — ASPIRIN EC 325 MG PO TBEC: 325.0000 mg | DELAYED_RELEASE_TABLET | Freq: Two times a day (BID) | ORAL | Status: DC

## 2013-01-12 MED ORDER — OXYCODONE-ACETAMINOPHEN 5-325 MG PO TABS: 1.0000 | ORAL_TABLET | ORAL | Status: DC | PRN

## 2013-01-12 MED ORDER — FERROUS SULFATE 325 (65 FE) MG PO TABS: 325.0000 mg | ORAL_TABLET | Freq: Every day | ORAL | Status: DC

## 2013-01-12 NOTE — Discharge Summary (Signed)
Patient ID: Renee Watts MRN: [id number] DOB/AGE: Jan 03, 1951 62 y.o.  Admit date: 01/09/2013 Discharge date: 01/12/2013  Admission Diagnoses:  Principal Problem:   Arthritis of knee, left   Discharge Diagnoses:  Same Tachycardia resolved ( possible etiology anxiety and pain ) Acute blood loss anemia   Past Medical History  Diagnosis Date  . Hyperlipidemia   . Hypertension   . GERD (gastroesophageal reflux disease)   . Depression   . Arthritis   . Low back pain   . Osteoarthritis, knee     Bilateral, s/p 3 total knee replacement surgerues on right, last 2005. Dr. Shelle Iron  . Pellegrini-Steida syndrome     Myositis ossificans  . Obesity   . Dependent edema     Bilateral  . KNEE REPLACEMENT, HX OF 06/17/2006    Annotation: Right multiple re-do's 2003 & 2004 Qualifier: Diagnosis of  By: Candis Musa MD, Ruben Reason.   . HYSTERECTOMY, HX OF 06/17/2006    Annotation: Secondary to Fibroids Qualifier: Diagnosis of  By: Candis Musa MD, Ruben Reason TONSILLECTOMY, HX OF 06/17/2006    Qualifier: Diagnosis of  By: Candis Musa MD, Veronique D.   . Diabetes mellitus     ORAL MEDICATION  . Shingles Jul 10 2012    LESIONS CLEARED BUT NOT TOLERATING ANY THING TIGHT AGAINST BODY--SHINGLES WERE AROSS BACK AND ABDOMEN  . Seasonal allergies     Surgeries: Procedure(s): LEFT TOTAL KNEE ARTHROPLASTY on 01/09/2013   Consultants:  PT  Discharged Condition: Improved  Hospital Course: Renee Watts is an 62 y.o. female who was admitted 01/09/2013 for operative treatment ofArthritis of knee, left. Patient has severe unremitting pain that affects sleep, daily activities, and work/hobbies. After pre-op clearance the patient was taken to the operating room on 01/09/2013 and underwent  Procedure(s): LEFT TOTAL KNEE ARTHROPLASTY.    Patient was given perioperative antibiotics: Anti-infectives   Start     Dose/Rate Route Frequency Ordered Stop   01/09/13 1400  ceFAZolin (ANCEF) IVPB 1 g/50 mL  premix     1 g 100 mL/hr over 30 Minutes Intravenous Every 6 hours 01/09/13 1040 01/09/13 1953   01/09/13 0600  ceFAZolin (ANCEF) IVPB 2 g/50 mL premix     2 g 100 mL/hr over 30 Minutes Intravenous On call to O.R. 01/09/13 0600 01/09/13 0739       Patient was given sequential compression devices, early ambulation, and chemoprophylaxis to prevent DVT.  Patient benefited maximally from hospital stay and there were no complications.   Tachycardia post-op possibly secondary to anxiety and pain. One time dose Lopressor given. ABLA 2ndary to surgery will start iron at home.  Recent vital signs: Patient Vitals for the past 24 hrs:  BP Temp Temp src Pulse Resp SpO2  01/12/13 0457 114/70 mmHg 98 F (36.7 C) Oral 85 16 95 %  01/11/13 2136 104/66 mmHg 99.9 F (37.7 C) Oral 114 20 95 %  01/11/13 1350 107/65 mmHg 98.6 F (37 C) - 97 18 96 %  01/11/13 1328 - - - 90 - -  01/11/13 0918 - - - 132 - -     Recent laboratory studies:  Recent Labs  01/10/13 0547 01/11/13 0503 01/12/13 0500  WBC 10.2 13.6* 11.5*  HGB 10.7* 10.0* 8.7*  HCT 31.8* 30.6* 26.8*  PLT 185 164 148*  NA 134*  --   --   K 3.7  --   --   CL 98  --   --   CO2 29  --   --  BUN 6  --   --   CREATININE 0.37*  --   --   GLUCOSE 154*  --   --   CALCIUM 9.3  --   --      Discharge Medications:     Medication List    TAKE these medications       aspirin EC 325 MG tablet  Take 1 tablet (325 mg total) by mouth 2 (two) times daily.     ferrous sulfate 325 (65 FE) MG tablet  Commonly known as:  FERROUSUL  Take 1 tablet (325 mg total) by mouth daily with breakfast.     furosemide 40 MG tablet  Commonly known as:  LASIX  Take 40 mg by mouth daily.     gabapentin 100 MG capsule  Commonly known as:  NEURONTIN  Take 300 mg by mouth at bedtime.     lisinopril 20 MG tablet  Commonly known as:  PRINIVIL,ZESTRIL  Take 20 mg by mouth every evening.     metFORMIN 1000 MG tablet  Commonly known as:  GLUCOPHAGE   Take 1,000 mg by mouth 2 (two) times daily with a meal.     methocarbamol 500 MG tablet  Commonly known as:  ROBAXIN  Take 1 tablet (500 mg total) by mouth every 6 (six) hours as needed.     omeprazole 10 MG capsule  Commonly known as:  PRILOSEC  Take 10 mg by mouth daily as needed.     oxyCODONE-acetaminophen 5-325 MG per tablet  Commonly known as:  ROXICET  Take 1-2 tablets by mouth every 4 (four) hours as needed for pain.     pravastatin 40 MG tablet  Commonly known as:  PRAVACHOL  Take 40 mg by mouth every evening.        Diagnostic Studies: Dg Knee Left Port  01/09/2013   *RADIOLOGY REPORT*  Clinical Data: Postop total left knee replacement.  PORTABLE LEFT KNEE - 1-2 VIEW  Comparison: 11/17/2005.  Findings: Post total left knee replacement.  This appears in satisfactory position without complication noted.  Prominence of the soft tissue with subcutaneous air.  Overlying splint and bandage slightly limits evaluation.  IMPRESSION: Post total left knee replacement.  This appears in satisfactory position without complication noted.  Prominence of the soft tissue with subcutaneous air.  Overlying splint and bandage slightly limits evaluation.   Original Report Authenticated By: Lacy Duverney, M.D.    Disposition: 06-Home-Health Care Svc      Discharge Orders   Future Orders Complete By Expires     Discharge wound care:  As directed     Comments:      Keep dressing clean dry and intact until Thursday then remove dressing and shower. Apply clean dressing after showering    Weight bearing as tolerated  As directed        Follow-up Information   Follow up with Kathryne Hitch, MD. Schedule an appointment as soon as possible for a visit in 2 weeks.   Contact information:   [address] [place] [address] [phone]        Signed: Richardean Canal 01/12/2013, 8:00 AM

## 2013-01-12 NOTE — Progress Notes (Signed)
OT  Note  Patient Details Name: MESHAWN OCONNOR MRN: [id number] DOB: 1951/07/03   Cancelled Treatment:    Reason Eval/Treat Not Completed: Other (comment) (no OT needs) Per patient.  Alba Cory 01/12/2013, 12:08 PM

## 2013-01-12 NOTE — Progress Notes (Signed)
Subjective: 3 Days Post-Op Procedure(s) (LRB): LEFT TOTAL KNEE ARTHROPLASTY (Left) Patient reports pain as mild.   Patient doing well no chest pain or SOB.  Objective: Vital signs in last 24 hours: Temp:  [98 F (36.7 C)-99.9 F (37.7 C)] 98 F (36.7 C) (05/19 0457) Pulse Rate:  [85-132] 85 (05/19 0457) Resp:  [16-20] 16 (05/19 0457) BP: (104-114)/(65-70) 114/70 mmHg (05/19 0457) SpO2:  [95 %-96 %] 95 % (05/19 0457)  Intake/Output from previous day: 05/18 0701 - 05/19 0700 In: 1073.7 [P.O.:780; I.V.:293.7] Out: 350 [Urine:350] Intake/Output this shift:     Recent Labs  01/10/13 0547 01/11/13 0503 01/12/13 0500  HGB 10.7* 10.0* 8.7*    Recent Labs  01/11/13 0503 01/12/13 0500  WBC 13.6* 11.5*  RBC 3.80* 3.34*  HCT 30.6* 26.8*  PLT 164 148*    Recent Labs  01/10/13 0547  NA 134*  K 3.7  CL 98  CO2 29  BUN 6  CREATININE 0.37*  GLUCOSE 154*  CALCIUM 9.3   No results found for this basename: LABPT, INR,  in the last 72 hours  Left lower leg: Neurovascular intact Intact pulses distally Incision: dressing C/D/I Compartment soft  Assessment/Plan: 3 Days Post-Op Procedure(s) (LRB): LEFT TOTAL KNEE ARTHROPLASTY (Left) Up with therapy Discharge home with home health Tachycardia resolved ABLA secondary to surgery monitor for symptoms, iron replacement. Aspirin for DVT prophylaxis  Renee Watts 01/12/2013, 7:49 AM

## 2013-01-12 NOTE — Progress Notes (Signed)
Discharge summary sent to payer through [facility]  

## 2013-01-12 NOTE — Progress Notes (Signed)
Pt to d/c home with [facility]. No DME needed at d/c. AVS reviewed and "My Chart" discussed with pt. Pt capable of verbalizing medications and follow-up appointments. Remains hemodynamically stable. No signs and symptoms of distress. Educated pt to return to ER in the case of SOB, dizziness, or chest pain.

## 2013-01-12 NOTE — Progress Notes (Signed)
Physical Therapy Treatment Patient Details Name: Renee Watts MRN: [id number] DOB: February 28, 1951 Today's Date: 01/12/2013 Time: 0930-1002 PT Time Calculation (min): 32 min  PT Assessment / Plan / Recommendation Comments on Treatment Session  Pt ambulated 180' with RW with supervision, stair training completed, doing well with ther ex. Ready to DC home from PT standpoint. HR 90 at rest.     Follow Up Recommendations  Home health PT     Does the patient have the potential to tolerate intense rehabilitation     Barriers to Discharge        Equipment Recommendations  None recommended by PT    Recommendations for Other Services OT consult  Frequency 7X/week   Plan Discharge plan remains appropriate    Precautions / Restrictions Precautions Precautions: Knee Required Braces or Orthoses: Knee Immobilizer - Left Knee Immobilizer - Left: Discontinue once straight leg raise with < 10 degree lag Restrictions Weight Bearing Restrictions: No Other Position/Activity Restrictions: WBAT   Pertinent Vitals/Pain *6/10 L knee with walking Premedicated, ice applied**    Mobility  Bed Mobility Bed Mobility: Supine to Sit Supine to Sit: 4: Min assist Details for Bed Mobility Assistance:  assist for LLE Transfers Transfers: Sit to Stand;Stand to Sit Sit to Stand: With upper extremity assist;5: Supervision;From bed Stand to Sit: With armrests;To chair/3-in-1;With upper extremity assist;5: Supervision Details for Transfer Assistance: cues for LE position and use of UEs to self assist - pt limited by ROM loss at R knee Ambulation/Gait Ambulation/Gait Assistance: 5: Supervision Ambulation Distance (Feet): 180 Feet Assistive device: Rolling walker Gait Pattern: Step-to pattern;Decreased step length - right;Antalgic Gait velocity: slow General Gait Details: steady, no LOB, VCs to correct flexed neck Stairs: Yes Stairs Assistance: 5: Supervision Stairs Assistance Details (indicate cue type  and reason): VCs seqeuncing Stair Management Technique: No rails;Backwards Number of Stairs: 1    Exercises Total Joint Exercises Ankle Circles/Pumps: AROM;10 reps;Supine;Both Quad Sets: AROM;Both;10 reps;Supine Towel Squeeze: AROM;Both;10 reps Short Arc QuadBarbaraann Watts;Left;10 reps;Supine Heel Slides: AAROM;10 reps;Supine;Left Hip ABduction/ADduction: AAROM;Left;10 reps Straight Leg Raises: AAROM;Left;10 reps;Supine   PT Diagnosis:    PT Problem List:   PT Treatment Interventions:     PT Goals Acute Rehab PT Goals PT Goal Formulation: With patient Time For Goal Achievement: 01/10/13 Potential to Achieve Goals: Good Pt will go Supine/Side to Sit: with supervision PT Goal: Supine/Side to Sit - Progress: Progressing toward goal Pt will go Sit to Supine/Side: with supervision Pt will go Sit to Stand: with supervision PT Goal: Sit to Stand - Progress: Met Pt will go Stand to Sit: with supervision PT Goal: Stand to Sit - Progress: Met Pt will Ambulate: 51 - 150 feet;with supervision;with rolling walker PT Goal: Ambulate - Progress: Met Pt will Go Up / Down Stairs: 1-2 stairs;with least restrictive assistive device;with min assist PT Goal: Up/Down Stairs - Progress: Met  Visit Information  Last PT Received On: 01/12/13    Subjective Data  Subjective: I'm feeling much better today. 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

## 2013-02-12 ENCOUNTER — Encounter: Payer: BC Managed Care – PPO | Admitting: Internal Medicine

## 2013-02-12 ENCOUNTER — Encounter: Payer: Self-pay | Admitting: Internal Medicine

## 2013-02-12 ENCOUNTER — Other Ambulatory Visit: Payer: Self-pay | Admitting: Internal Medicine

## 2013-02-12 ENCOUNTER — Telehealth: Payer: Self-pay | Admitting: Dietician

## 2013-02-12 NOTE — Telephone Encounter (Signed)
Having problems with her knee after surgery, had to cancel because of upcoming outpatient procedure( says knee is not moving right) . Her blood sugar are < 117 every morning and she will call Dr. Mitzi Davenport  and [place] for appointments once she is feeling better.

## 2013-02-23 ENCOUNTER — Telehealth: Payer: Self-pay | Admitting: *Deleted

## 2013-02-25 NOTE — Telephone Encounter (Signed)
Empty request 

## 2013-03-02 ENCOUNTER — Other Ambulatory Visit: Payer: Self-pay | Admitting: Internal Medicine

## 2013-03-03 NOTE — Telephone Encounter (Signed)
Needs an appointment for F/U DM.

## 2013-03-05 ENCOUNTER — Other Ambulatory Visit: Payer: Self-pay

## 2013-03-11 ENCOUNTER — Telehealth: Payer: Self-pay | Admitting: *Deleted

## 2013-03-11 NOTE — Telephone Encounter (Signed)
Pt called with c/o bil ankle swelling. Onset 5 days ago. Denies SOB. Pt did eat large amount of bugles, 2 bags a day last week.  She stopped eating them on Sunday.  She is drinking a lot of water. Knee surgery in May, with manipulation 3 weeks ago.   Will see tomorrow for evaluation

## 2013-03-12 ENCOUNTER — Ambulatory Visit (INDEPENDENT_AMBULATORY_CARE_PROVIDER_SITE_OTHER): Payer: BC Managed Care – PPO | Admitting: Internal Medicine

## 2013-03-12 ENCOUNTER — Encounter: Payer: Self-pay | Admitting: Internal Medicine

## 2013-03-12 VITALS — BP 132/75 | HR 68 | Temp 97.8°F | Ht 66.0 in | Wt 179.2 lb

## 2013-03-12 DIAGNOSIS — I10 Essential (primary) hypertension: Secondary | ICD-10-CM

## 2013-03-12 DIAGNOSIS — E1142 Type 2 diabetes mellitus with diabetic polyneuropathy: Secondary | ICD-10-CM

## 2013-03-12 DIAGNOSIS — D649 Anemia, unspecified: Secondary | ICD-10-CM | POA: Insufficient documentation

## 2013-03-12 DIAGNOSIS — R609 Edema, unspecified: Secondary | ICD-10-CM

## 2013-03-12 DIAGNOSIS — E1149 Type 2 diabetes mellitus with other diabetic neurological complication: Secondary | ICD-10-CM

## 2013-03-12 DIAGNOSIS — E785 Hyperlipidemia, unspecified: Secondary | ICD-10-CM

## 2013-03-12 DIAGNOSIS — Z0000 Encounter for general adult medical examination without abnormal findings: Secondary | ICD-10-CM

## 2013-03-12 LAB — LIPID PANEL: LDL Cholesterol: 80 mg/dL (ref 0–99)

## 2013-03-12 LAB — CBC
Hemoglobin: 10 g/dL — ABNORMAL LOW (ref 12.0–15.0)
MCH: 26.7 pg (ref 26.0–34.0)
MCV: 82.9 fL (ref 78.0–100.0)
Platelets: 215 10*3/uL (ref 150–400)
RBC: 3.74 MIL/uL — ABNORMAL LOW (ref 3.87–5.11)
RDW: 17.4 % — ABNORMAL HIGH (ref 11.5–15.5)
WBC: 4.8 10*3/uL (ref 4.0–10.5)

## 2013-03-12 LAB — GLUCOSE, CAPILLARY: Glucose-Capillary: 78 mg/dL (ref 70–99)

## 2013-03-12 LAB — POCT GLYCOSYLATED HEMOGLOBIN (HGB A1C): Hemoglobin A1C: 5.6

## 2013-03-12 NOTE — Assessment & Plan Note (Addendum)
B/l lower extremity mild pitting edema x5 days.  Recent L knee repair 12/2012.  Undergoing physical therapy. Hx of HTN and well controlled Diabetes.  Took lasix 40mg  yesterday.  Admits to increase salt intake and not able to elevate her legs as much.  No tenderness to palpation of lower extremities.    -increase lasix to 60mg  x3 days and return to clinic next week for check bmet and advised to call clinic to report if improvement or worsening of edema -cautioned on alarm signs including worsening edema, pain, redness, chest pain, or shortness of breath to notify us right away or if severe to go ED -advised to decrease salt intake and use her compression stockings as well

## 2013-03-12 NOTE — Assessment & Plan Note (Signed)
BP Readings from Last 3 Encounters:  03/12/13 132/75  01/12/13 112/64  01/12/13 112/64   Lab Results  Component Value Date   NA 134* 01/10/2013   K 3.7 01/10/2013   CREATININE 0.37* 01/10/2013   Assessment: Blood pressure control: controlled Progress toward BP goal:  at goal  Plan: Medications:  continue current medications but increase lasix to 60mg  qd x3 days and continue lisinopril Educational resources provided: brochure;handout;video *

## 2013-03-12 NOTE — Progress Notes (Signed)
Subjective:   Patient ID: Renee Watts female   DOB: 1951-07-27 62 y.o.   MRN: [id number]  HPI: [name] is a 62 y.o. African American pleasant female with PMH of HTN, DM2, GERD, s/p b/l knee replacements (2 on right ~11 years ago), s/p total R hip arthroplasty 10/10/2012, and most recently s/p L total knee replacement 12/2012 presenting to clinic today with complaints of b/l lower extremity swelling x5 days.  She is noted to have a hx of lower extremity edema on chart review and is on lasix as needed at home.  She reports taking 40mg  of lasix yesterday that she takes approximately every other day. She reports mild improvement in swelling today and endorses eating lots of potato chips last week that she thinks may have caused the swelling.  She is also undergoing physical therapy after knee replacement and has been unable to elevate her legs as much due to stiffness noted in knee.   Past Medical History  Diagnosis Date  . Hyperlipidemia   . Hypertension   . GERD (gastroesophageal reflux disease)   . Depression   . Arthritis   . Low back pain   . Osteoarthritis, knee     Bilateral, s/p 3 total knee replacement surgerues on right, last 2005. Dr. Shelle Watts  . Pellegrini-Steida syndrome     Myositis ossificans  . Obesity   . Dependent edema     Bilateral  . KNEE REPLACEMENT, HX OF 06/17/2006    Annotation: Right multiple re-do's 2003 & 2004 Qualifier: Diagnosis of  By: Renee Musa MD, Renee Watts.   . HYSTERECTOMY, HX OF 06/17/2006    Annotation: Secondary to Fibroids Qualifier: Diagnosis of  By: Renee Musa MD, Renee Watts TONSILLECTOMY, HX OF 06/17/2006    Qualifier: Diagnosis of  By: Renee Musa MD, Renee Watts.   . Diabetes mellitus     ORAL MEDICATION  . Shingles Jul 10 2012    LESIONS CLEARED BUT NOT TOLERATING ANY THING TIGHT AGAINST BODY--SHINGLES WERE AROSS BACK AND ABDOMEN  . Seasonal allergies    Current Outpatient Prescriptions  Medication Sig Dispense Refill  .  furosemide (LASIX) 40 MG tablet Take 40 mg by mouth daily.      Marland Kitchen gabapentin (NEURONTIN) 100 MG capsule Take 300 mg by mouth at bedtime.      Marland Kitchen lisinopril (PRINIVIL,ZESTRIL) 20 MG tablet TAKE 1 TABLET BY MOUTH DAILY  30 tablet  1  . metFORMIN (GLUCOPHAGE) 1000 MG tablet TAKE 1 TABLET BY MOUTH TWICE DAILY WITH A MEAL  60 tablet  1  . omeprazole (PRILOSEC) 10 MG capsule Take 10 mg by mouth daily as needed.      . pravastatin (PRAVACHOL) 40 MG tablet TAKE 1 TABLET BY MOUTH EVERY EVENING  30 tablet  1  . aspirin 81 MG tablet Take 81 mg by mouth daily.      . ferrous sulfate (FERROUSUL) 325 (65 FE) MG tablet Take 1 tablet (325 mg total) by mouth daily with breakfast.  50 tablet  0  . methocarbamol (ROBAXIN) 500 MG tablet Take 1 tablet (500 mg total) by mouth every 6 (six) hours as needed.  50 tablet  2  . oxyCODONE-acetaminophen (ROXICET) 5-325 MG per tablet Take 1-2 tablets by mouth every 4 (four) hours as needed for pain.  40 tablet  0   No current facility-administered medications for this visit.   Family History  Problem Relation Age of Onset  . Diabetes Mother   . Hypertension Mother   .  Hypertension Father   . Cancer Father     Lung CA  . Coronary artery disease Father    History   Social History  . Marital Status: Married    Spouse Name: N/A    Number of Children: N/A  . Years of Education: N/A   Social History Main Topics  . Smoking status: Never Smoker   . Smokeless tobacco: Never Used  . Alcohol Use: No  . Drug Use: No  . Sexually Active: None   Other Topics Concern  . None   Social History Narrative   Married, 3 daughters.   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   Nikotine: never   ETOH: no   Drugs: no   Review of Systems:  Constitutional:  Denies fever, chills, diaphoresis, appetite change and fatigue.   HEENT:  Denies congestion, sore throat, rhinorrhea, sneezing, mouth sores, trouble swallowing, neck pain   Respiratory:   Denies SOB, DOE, cough, and wheezing.   Cardiovascular:  Denies chest pain, palpitations.  Gastrointestinal:  Denies nausea, vomiting, abdominal pain, diarrhea, constipation, blood in stool and abdominal distention.   Genitourinary:  Denies dysuria, urgency, frequency, hematuria, flank pain and difficulty urinating.   Musculoskeletal:  B/l ankle and leg swelling.  Denies myalgias, back pain, arthralgias and gait problem.   Skin:  Denies pallor, rash and wound.   Neurological:  Denies dizziness, seizures, syncope, weakness, light-headedness, numbness and headaches.    Objective:  Physical Exam: Filed Vitals:   03/12/13 1049  BP: 132/75  Pulse: 68  Temp: 97.8 F (36.6 C)  TempSrc: Oral  Height: 5\' 6"  (1.676 m)  Weight: 179 lb 3.2 oz (81.285 kg)  SpO2: 99%   Vitals reviewed. General: sitting in chair, NAD HEENT: PERRL, EOMI, no scleral icterus Cardiac: RRR, no rubs, murmurs or gallops Pulm: clear to auscultation bilaterally, no wheezes, rales, or rhonchi Abd: soft, nontender, nondistended, BS present Ext: warm and well perfused, +mild b/l pitting edema, no tenderness to palpation, swelling around ankles, +2DP B/L, well healed b/l knee surgical scars Neuro: alert and oriented X3, cranial nerves II-XII grossly intact, strength and sensation to light touch equal in bilateral upper and lower extremities  Assessment & Plan:  Discussed with Dr. Criselda Peaches Lower extremity edema: will increase lasix to 60mg  x3 days, compression stockings, decrease salt intake, elevate as tolerated. Recheck bmet next week.

## 2013-03-12 NOTE — Patient Instructions (Addendum)
General Instructions: Please decrease your salt intake and elevate your legs if possible while resting.  Try your compression stockings to see if it helps with the swelling  Please take 60mg  of lasix daily for the next three days and see if the swelling improves.  Call the clinic [phone] and let us know if it gets better or worse or no change  If you start noticing redness, pain, or drainage or more swelling and pain in one leg vs the other please let us know right away as well  You will need to return to clinic for a check of your electrolytes after the lasix next week for a lab visit, if your swelling has gotten worse or not improved you will need to see your pcp.  Please reschedule your eye exam  Treatment Goals:  Goals (1 Years of Data) as of 03/12/13         As of Today 01/12/13 01/12/13 01/11/13 01/11/13     Blood Pressure    . Blood Pressure < 140/90  132/75 112/64 114/70 104/66 107/65     Result Component    . HEMOGLOBIN A1C < 7.0  5.6        . LDL CALC < 100           Progress Toward Treatment Goals:  Treatment Goal 03/12/2013  Hemoglobin A1C improved  Blood pressure at goal   Self Care Goals & Plans:  Self Care Goal 03/12/2013  Manage my medications take my medicines as prescribed; bring my medications to every visit; refill my medications on time; follow the sick day instructions if I am sick  Monitor my health keep track of my blood glucose; keep track of my blood pressure; keep track of my weight; check my feet daily  Eat healthy foods eat more vegetables; eat fruit for snacks and desserts; eat foods that are low in salt; eat baked foods instead of fried foods; eat smaller portions; drink diet soda or water instead of juice or soda  Be physically active find an activity I enjoy; take a walk every day    Home Blood Glucose Monitoring 03/12/2013  Check my blood sugar 3 times a day  When to check my blood sugar before meals   Care Management & Community Referrals:    Edema Edema is an abnormal build-up of fluids in tissues. Because this is partly dependent on gravity (water flows to the lowest place), it is more common in the legs and thighs (lower extremities). It is also common in the looser tissues, like around the eyes. Painless swelling of the feet and ankles is common and increases as a person ages. It may affect both legs and may include the calves or even thighs. When squeezed, the fluid may move out of the affected area and may leave a dent for a few moments. CAUSES   Prolonged standing or sitting in one place for extended periods of time. Movement helps pump tissue fluid into the veins, and absence of movement prevents this, resulting in edema.  Varicose veins. The valves in the veins do not work as well as they should. This causes fluid to leak into the tissues.  Fluid and salt overload.  Injury, burn, or surgery to the leg, ankle, or foot, may damage veins and allow fluid to leak out.  Sunburn damages vessels. Leaky vessels allow fluid to go out into the sunburned tissues.  Allergies (from insect bites or stings, medications or chemicals) cause swelling by allowing vessels  to become leaky.  Protein in the blood helps keep fluid in your vessels. Low protein, as in malnutrition, allows fluid to leak out.  Hormonal changes, including pregnancy and menstruation, cause fluid retention. This fluid may leak out of vessels and cause edema.  Medications that cause fluid retention. Examples are sex hormones, blood pressure medications, steroid treatment, or anti-depressants.  Some illnesses cause edema, especially heart failure, kidney disease, or liver disease.  Surgery that cuts veins or lymph nodes, such as surgery done for the heart or for breast cancer, may result in edema. DIAGNOSIS  Your caregiver is usually easily able to determine what is causing your swelling (edema) by simply asking what is wrong (getting a history) and examining you (doing  a physical). Sometimes x-rays, EKG (electrocardiogram or heart tracing), and blood work may be done to evaluate for underlying medical illness. TREATMENT  General treatment includes:  Leg elevation (or elevation of the affected body part).  Restriction of fluid intake.  Prevention of fluid overload.  Compression of the affected body part. Compression with elastic bandages or support stockings squeezes the tissues, preventing fluid from entering and forcing it back into the blood vessels.  Diuretics (also called water pills or fluid pills) pull fluid out of your body in the form of increased urination. These are effective in reducing the swelling, but can have side effects and must be used only under your caregiver's supervision. Diuretics are appropriate only for some types of edema. The specific treatment can be directed at any underlying causes discovered. Heart, liver, or kidney disease should be treated appropriately. HOME CARE INSTRUCTIONS   Elevate the legs (or affected body part) above the level of the heart, while lying down.  Avoid sitting or standing still for prolonged periods of time.  Avoid putting anything directly under the knees when lying down, and do not wear constricting clothing or garters on the upper legs.  Exercising the legs causes the fluid to work back into the veins and lymphatic channels. This may help the swelling go down.  The pressure applied by elastic bandages or support stockings can help reduce ankle swelling.  A low-salt diet may help reduce fluid retention and decrease the ankle swelling.  Take any medications exactly as prescribed. SEEK MEDICAL CARE IF:  Your edema is not responding to recommended treatments. SEEK IMMEDIATE MEDICAL CARE IF:   You develop shortness of breath or chest pain.  You cannot breathe when you lay down; or if, while lying down, you have to get up and go to the window to get your breath.  You are having increasing  swelling without relief from treatment.  You develop a fever over 102 F (38.9 C).  You develop pain or redness in the areas that are swollen.  Tell your caregiver right away if you have gained 3 lb/1.4 kg in 1 day or 5 lb/2.3 kg in a week. MAKE SURE YOU:   Understand these instructions.  Will watch your condition.  Will get help right away if you are not doing well or get worse. Document Released: 08/13/2005 Document Revised: 02/12/2012 Document Reviewed: 03/31/2008 [facility] Patient Information 2014 [place], [place].

## 2013-03-12 NOTE — Assessment & Plan Note (Signed)
12/2012 Hb 8.7 with MCV 80.2, down from 12 10/01/12 .  Reports being started on iron supplementation.  Occasional fatigue but denies any weakness, shortness of breath, chest pain.    -f/u repeat cbc and iron panel

## 2013-03-12 NOTE — Assessment & Plan Note (Signed)
Lab Results  Component Value Date   HGBA1C 5.6 03/12/2013   HGBA1C 6.1 10/30/2012   HGBA1C 6.3 08/06/2012    Assessment: Diabetes control: good control (HgbA1C at goal) Progress toward A1C goal:  improved  Plan: Medications:  continue current medications metformin 1000mg  Home glucose monitoring: Frequency: 3 times a day Timing: before meals Instruction/counseling given: reminded to get eye exam, reminded to bring blood glucose meter & log to each visit, reminded to bring medications to each visit, discussed foot care and discussed diet Educational resources provided: brochure;handout S

## 2013-03-12 NOTE — Assessment & Plan Note (Signed)
Checked lipid panel and a1c today and she will reschedule her eye appointment

## 2013-03-13 NOTE — Progress Notes (Signed)
Case discussed with Dr. Qureshi at the time of the visit.  We reviewed the resident's history and exam and pertinent patient test results.  I agree with the assessment, diagnosis, and plan of care documented in the resident's note. 

## 2013-03-19 ENCOUNTER — Encounter: Payer: Self-pay | Admitting: Internal Medicine

## 2013-03-19 ENCOUNTER — Other Ambulatory Visit: Payer: BC Managed Care – PPO

## 2013-03-24 ENCOUNTER — Ambulatory Visit (INDEPENDENT_AMBULATORY_CARE_PROVIDER_SITE_OTHER): Payer: BC Managed Care – PPO | Admitting: Internal Medicine

## 2013-03-24 ENCOUNTER — Encounter: Payer: Self-pay | Admitting: Internal Medicine

## 2013-03-24 VITALS — BP 128/71 | HR 62 | Temp 97.2°F | Ht 66.0 in | Wt 175.6 lb

## 2013-03-24 DIAGNOSIS — R609 Edema, unspecified: Secondary | ICD-10-CM

## 2013-03-24 DIAGNOSIS — E1149 Type 2 diabetes mellitus with other diabetic neurological complication: Secondary | ICD-10-CM

## 2013-03-24 LAB — GLUCOSE, CAPILLARY

## 2013-03-24 NOTE — Patient Instructions (Addendum)
General Instructions: Please make an appointment to come back some time in October (2-3 months) to check your A1c and for diabetes follow up. If lower extremity swelling returns and becomes worrisome or involves pain, increased warmth, fever, or shortness of breath, please come see [place]. Follow up with orthopedic surgeon. Make an eye appointment in the near future (if you have not done so already). So nice to meet you!  Treatment Goals:  Goals (1 Years of Data) as of 03/24/13         As of Today 03/12/13 01/12/13 01/12/13 01/11/13     Blood Pressure    . Blood Pressure < 140/90  128/71 132/75 112/64 114/70 104/66     Result Component    . HEMOGLOBIN A1C < 7.0   5.6       . LDL CALC < 100   80         Progress Toward Treatment Goals:  Treatment Goal 03/24/2013  Hemoglobin A1C at goal  Blood pressure at goal    Self Care Goals & Plans:  Self Care Goal 03/24/2013  Manage my medications take my medicines as prescribed; bring my medications to every visit; refill my medications on time  Monitor my health keep track of my blood glucose; bring my glucose meter and log to each visit  Eat healthy foods drink diet soda or water instead of juice or soda; eat more vegetables; eat foods that are low in salt; eat baked foods instead of fried foods; eat fruit for snacks and desserts  Be physically active -  Meeting treatment goals maintain the current self-care plan    Home Blood Glucose Monitoring 03/24/2013  Check my blood sugar once a day  When to check my blood sugar before breakfast    Care Management & Community Referrals:  Referral 03/24/2013  Referrals made to community resources none

## 2013-03-24 NOTE — Assessment & Plan Note (Signed)
Her diabetes is very well controlled and her last A1c was 5.6. She is currently on 1000 mg bid of Metformin. Her sugars run from 70-125 when she checks before breakfast.  Eye appointment scheduled for next month. Urine albumin/creatinine was checked today since her last was done in 2012.

## 2013-03-24 NOTE — Progress Notes (Signed)
Patient ID: Renee Watts, female   DOB: [birth year], 62 y.o.   MRN: [id number]   Subjective:   Patient ID: Renee Watts female   DOB: [birth year] 62 y.o.   MRN: [id number]  HPI: Ms. [name] is a 62 y.o. y/o female w/ PMHx of HTN, DM2, GERD, s/p b/l knee replacements (2 on right ~11 years ago), s/p total R hip arthroplasty (10/10/2012), and most recently s/p L total knee replacement (12/2012), presents to the clinic today for follow up. She came in to the clinic and saw Dr. Virgina Organ on 03/12/13 for bilateral LE swelling/edema on 03/12/13. She was taking lasix 40 mg every other day and was told to take 60 mg for three days for her LE swelling. Today, she has no complaints of swelling in her legs and says they are much better than her last visit. She denies any pain or warmth associated with this previous complaint. She has no other issues at this time. She denies any chest pain, SOB, fever, chills, nausea, vomiting, abdominal pain, or diarrhea.  Her blood glucose at her appointment was in the 70's, which she says is a relatively normal value for her. She denies any symptoms of hypoglycemia such as palpitations, lightheadedness, dizziness, diaphoresis, or tremors. She says she checks her blood sugar every three days before breakfast and her sugars run from 70's-120's. Her last A1c on 03/12/13 was 5.6. Her diabetes has been very well controlled on metformin 1000 mg bid.  She is also s/p L knee replacement in 5/14 and R hip replacement in 2/14. She is currently seeing PT every day and following up with her orthopedic surgeon tomorrow.   Past Medical History  Diagnosis Date  . Hyperlipidemia   . Hypertension   . GERD (gastroesophageal reflux disease)   . Depression   . Arthritis   . Low back pain   . Osteoarthritis, knee     Bilateral, s/p 3 total knee replacement surgerues on right, last 2005. Dr. Shelle Iron  . Pellegrini-Steida syndrome     Myositis ossificans  . Obesity   . Dependent edema      Bilateral  . KNEE REPLACEMENT, HX OF 06/17/2006    Annotation: Right multiple re-do's 2003 & 2004 Qualifier: Diagnosis of  By: Candis Musa MD, Ruben Reason.   . HYSTERECTOMY, HX OF 06/17/2006    Annotation: Secondary to Fibroids Qualifier: Diagnosis of  By: Candis Musa MD, Ruben Reason TONSILLECTOMY, HX OF 06/17/2006    Qualifier: Diagnosis of  By: Candis Musa MD, Veronique D.   . Diabetes mellitus     ORAL MEDICATION  . Shingles Jul 10 2012    LESIONS CLEARED BUT NOT TOLERATING ANY THING TIGHT AGAINST BODY--SHINGLES WERE AROSS BACK AND ABDOMEN  . Seasonal allergies    Current Outpatient Prescriptions  Medication Sig Dispense Refill  . aspirin 81 MG tablet Take 81 mg by mouth daily.      . ferrous sulfate (FERROUSUL) 325 (65 FE) MG tablet Take 1 tablet (325 mg total) by mouth daily with breakfast.  50 tablet  0  . furosemide (LASIX) 40 MG tablet Take 40 mg by mouth daily.      Marland Kitchen gabapentin (NEURONTIN) 100 MG capsule Take 300 mg by mouth at bedtime.      Marland Kitchen lisinopril (PRINIVIL,ZESTRIL) 20 MG tablet TAKE 1 TABLET BY MOUTH DAILY  30 tablet  1  . metFORMIN (GLUCOPHAGE) 1000 MG tablet TAKE 1 TABLET BY MOUTH TWICE DAILY WITH A MEAL  60 tablet  1  . methocarbamol (ROBAXIN) 500 MG tablet Take 1 tablet (500 mg total) by mouth every 6 (six) hours as needed.  50 tablet  2  . omeprazole (PRILOSEC) 10 MG capsule Take 10 mg by mouth daily as needed.      Marland Kitchen oxyCODONE-acetaminophen (ROXICET) 5-325 MG per tablet Take 1-2 tablets by mouth every 4 (four) hours as needed for pain.  40 tablet  0  . pravastatin (PRAVACHOL) 40 MG tablet TAKE 1 TABLET BY MOUTH EVERY EVENING  30 tablet  1   No current facility-administered medications for this visit.   Family History  Problem Relation Age of Onset  . Diabetes Mother   . Hypertension Mother   . Hypertension Father   . Cancer Father     Lung CA  . Coronary artery disease Father    History   Social History  . Marital Status: Married    Spouse Name: N/A     Number of Children: N/A  . Years of Education: N/A   Social History Main Topics  . Smoking status: Never Smoker   . Smokeless tobacco: Never Used  . Alcohol Use: No  . Drug Use: No  . Sexually Active: None   Other Topics Concern  . None   Social History Narrative   Married, 3 daughters.   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  HEENT: Denies change in vision, eye pain, redness, hearing loss, congestion, sore throat, rhinorrhea, sneezing, mouth sores, trouble swallowing, neck pain, neck stiffness and tinnitus.   Respiratory: Denies SOB, DOE, cough, chest tightness, and wheezing.   Cardiovascular: Denies chest pain, palpitations and leg swelling.  Gastrointestinal: Denies nausea, vomiting, abdominal pain, diarrhea, constipation, blood in stool and abdominal distention.  Genitourinary: Denies dysuria, urgency, frequency, hematuria, flank pain and difficulty urinating.  Endocrine: Denies hot or cold intolerance, sweats, polyuria, polydipsia. Musculoskeletal: Has left knee pain, 2/2 her recent replacement. No swelling. Skin: Denies pallor, rash and wounds.  Neurological: Denies dizziness, seizures, syncope, weakness, lightheadedness, numbness and headaches.  Hematological: Denies adenopathy,easy bruising, personal or family bleeding history.  Psychiatric/Behavioral: Denies mood changes, confusion, nervousness, sleep disturbance and agitation.  Objective:  Physical Exam: Filed Vitals:   03/24/13 1548  BP: 128/71  Pulse: 62  Temp: 97.2 F (36.2 C)  TempSrc: Oral  Height: 5\' 6"  (1.676 m)  Weight: 175 lb 9.6 oz (79.652 kg)  SpO2: 98%   General: Vital signs reviewed.  Patient is a well-developed and well-nourished, in no acute distress and cooperative with exam. Alert and oriented x3.  Head: Normocephalic and  atraumatic. Nose: No erythema or drainage noted.  Turbinates normal. Mouth: No erythema, exudates, sores, or ulcerations. Moist mucus membranes. Eyes: PERRL, EOMI, conjunctivae normal, No scleral icterus.  Neck: Supple, trachea midline, normal ROM, No JVD, masses, thyromegaly, or carotid bruit present.  Cardiovascular: RRR, S1 normal, S2 normal, no murmurs, gallops, or rubs. Pulmonary/Chest: normal respiratory effort, CTAB, no wheezes, rales, or rhonchi. Abdominal: Soft. Non-tender, non-distended, bowel sounds are normal, no masses, organomegaly, or guarding present.  Musculoskeletal: No joint deformities, erythema, or stiffness, ROM full and no nontender. Bilateral scars on knees. L knee w/ minimal swelling, non tender on exam. Extremities: Trace pitting edema in LE's bilaterally,  pulses symmetric and intact bilaterally. No cyanosis or clubbing. Neurological: A&O x3, Strength is normal  and symmetric bilaterally, cranial nerve II-XII are grossly intact, no focal motor deficit, sensory intact to light touch bilaterally.  Skin: Warm, dry and intact. No rashes or erythema. Psychiatric: Normal mood and affect. speech and behavior is normal. Cognition and memory are normal.   Assessment & Plan:   Please see problem based assessment and plan.

## 2013-03-24 NOTE — Assessment & Plan Note (Signed)
Edema much improves since last visit, according to the patient. Only trace pitting edema. She is currently in PT for recent L knee replacement and her limitations 2/2 her recent surgery are most likely the reason for her edema.  She is following up w/ her orthopedic surgeon tomorrow.  BMET checked today. Currently taking Lasix 40 every other day.

## 2013-03-25 LAB — BASIC METABOLIC PANEL
BUN: 10 mg/dL (ref 6–23)
Chloride: 106 mEq/L (ref 96–112)
Glucose, Bld: 79 mg/dL (ref 70–99)
Potassium: 4 mEq/L (ref 3.5–5.3)
Sodium: 141 mEq/L (ref 135–145)

## 2013-03-25 LAB — MICROALBUMIN / CREATININE URINE RATIO
Creatinine, Urine: 64.9 mg/dL
Microalb Creat Ratio: 30.2 mg/g — ABNORMAL HIGH (ref 0.0–30.0)

## 2013-03-25 NOTE — Progress Notes (Signed)
Case discussed with Dr. Jones (at time of visit, soon after the resident saw the patient).  We reviewed the resident's history and exam and pertinent patient test results.  I agree with the assessment, diagnosis, and plan of care documented in the resident's note. 

## 2013-04-13 ENCOUNTER — Telehealth: Payer: Self-pay | Admitting: *Deleted

## 2013-04-13 NOTE — Telephone Encounter (Signed)
Pt called with concern about  bumps she has had over the past month.  The first one appeared on neck area around 7/17, it was like a mosquito bite, the little bump had pus in it then dried up and the skin peeled off and skin returned to normal. Then yesterday another one appeared on knee .  She treated with Mupirocin cream. The areas itch and last about 1 week.  Grandaughter had same looking area's and was told it was from a Mosquito.   Denies pain or burning. She is concern because she is a diabetic. Pt # [id number]  Last visit 7/29 and will be seen again 9/30 Please advise.

## 2013-04-13 NOTE — Telephone Encounter (Signed)
She would need an appt so that we can look at the bumps.

## 2013-04-14 NOTE — Telephone Encounter (Signed)
Will see tomorrow AM

## 2013-04-15 ENCOUNTER — Ambulatory Visit (INDEPENDENT_AMBULATORY_CARE_PROVIDER_SITE_OTHER): Payer: BC Managed Care – PPO | Admitting: Internal Medicine

## 2013-04-15 VITALS — BP 136/72 | HR 70 | Temp 97.7°F | Wt 176.6 lb

## 2013-04-15 DIAGNOSIS — B354 Tinea corporis: Secondary | ICD-10-CM

## 2013-04-15 DIAGNOSIS — M1710 Unilateral primary osteoarthritis, unspecified knee: Secondary | ICD-10-CM

## 2013-04-15 DIAGNOSIS — M1712 Unilateral primary osteoarthritis, left knee: Secondary | ICD-10-CM

## 2013-04-15 MED ORDER — BETAMETHASONE VALERATE 0.1 % EX OINT: TOPICAL_OINTMENT | Freq: Two times a day (BID) | CUTANEOUS | Status: DC

## 2013-04-15 MED ORDER — CLOTRIMAZOLE 1 % EX CREA: TOPICAL_CREAM | Freq: Two times a day (BID) | CUTANEOUS | Status: DC

## 2013-04-15 MED ORDER — TRAMADOL HCL 50 MG PO TABS: 50.0000 mg | ORAL_TABLET | Freq: Four times a day (QID) | ORAL | Status: DC | PRN

## 2013-04-15 NOTE — Progress Notes (Signed)
This is a Psychologist, occupational Note.  The care of the patient was discussed with Dr. Dalphine Handing and the assessment and plan was formulated with their assistance.  Please see their note for official documentation of the patient encounter.   Subjective:   Patient ID: Renee Watts female   DOB: 1951/03/23 62 y.o.   MRN: [id number]  CC: Skin lesions and lower back pain.   HPI: Renee Watts is a 62 y.o. female with past medical history significant for OA, DM, shingles and lower back pain who presents for evaluation of multiple skin lesion and lower back pain. She reports that she has had a lesion similar in the past but they resolved without complications. She reports that she has two lesions currently. One is present on her right upper chest and developed following a mosquito bite 2-3 weeks ago. She reports that area is very itchy and she scratched the area heavily. The area gradually grew in size. The area is not painful and she denies drainage or purulence from the lesion. She reports a similar area developed on her right starting Sunday (8/17) with identical symptoms. She has been itching it since that time and it has continued to grow to nearly the same size as the lesion on her right chest. She reports that she continues to scratch both of the areas. She reports applying Mupirocin to both of the lesions without improvements. She reports that her grand daughter who she helps take care of had a similar lesion last year that resolved.She denies recent travel or changes to her daily routine and she denies any recent activity in wooded areas. She does not report recent fatigue, sweating, chills, N/V or wight loss/gain. The only change over the past year is that she had right hip replacement in February and right knee replacement in April.   She also reports pain along her posterior legs that radiates superiorly extending into her lower back for the last month or two. She describes the pain 6/10 that  is described as stiff pain initially with occasional shooting pain. She reports that after moving for a while the pain resolves. She has tried using ibuprofen and advil without improvement in symptoms, but claims that percocet reduces pain to 0/10. She has continued to undergo PT for her knee replacement.      Past Medical History  Diagnosis Date   Hyperlipidemia    Hypertension    GERD (gastroesophageal reflux disease)    Depression    Arthritis    Low back pain    Osteoarthritis, knee     Bilateral, s/p 3 total knee replacement surgerues on right, last 2005. Dr. Shelle Iron   Pellegrini-Steida syndrome     Myositis ossificans   Obesity    Dependent edema     Bilateral   KNEE REPLACEMENT, HX OF 06/17/2006    Annotation: Right multiple re-do's 2003 & 2004 Qualifier: Diagnosis of  By: Candis Musa MD, Ruben Reason.    HYSTERECTOMY, HX OF 06/17/2006    Annotation: Secondary to Fibroids Qualifier: Diagnosis of  By: Candis Musa MD, Ruben Reason    TONSILLECTOMY, HX OF 06/17/2006    Qualifier: Diagnosis of  By: Candis Musa MD, Ruben Reason.    Diabetes mellitus     ORAL MEDICATION   Shingles Jul 10 2012    LESIONS CLEARED BUT NOT TOLERATING ANY THING TIGHT AGAINST BODY--SHINGLES WERE AROSS BACK AND ABDOMEN   Seasonal allergies    Current Outpatient Prescriptions  Medication Sig Dispense Refill  aspirin 81 MG tablet Take 81 mg by mouth daily.       betamethasone valerate ointment (VALISONE) 0.1 % Apply topically 2 (two) times daily.  30 g  0   clotrimazole (LOTRIMIN) 1 % cream Apply topically 2 (two) times daily.  30 g  3   ferrous sulfate (FERROUSUL) 325 (65 FE) MG tablet Take 1 tablet (325 mg total) by mouth daily with breakfast.  50 tablet  0   furosemide (LASIX) 40 MG tablet Take 40 mg by mouth daily.       gabapentin (NEURONTIN) 100 MG capsule Take 300 mg by mouth at bedtime.       lisinopril (PRINIVIL,ZESTRIL) 20 MG tablet TAKE 1 TABLET BY MOUTH DAILY  30 tablet  1    metFORMIN (GLUCOPHAGE) 1000 MG tablet TAKE 1 TABLET BY MOUTH TWICE DAILY WITH A MEAL  60 tablet  1   methocarbamol (ROBAXIN) 500 MG tablet Take 1 tablet (500 mg total) by mouth every 6 (six) hours as needed.  50 tablet  2   omeprazole (PRILOSEC) 10 MG capsule Take 10 mg by mouth daily as needed.       oxyCODONE-acetaminophen (ROXICET) 5-325 MG per tablet Take 1-2 tablets by mouth every 4 (four) hours as needed for pain.  40 tablet  0   pravastatin (PRAVACHOL) 40 MG tablet TAKE 1 TABLET BY MOUTH EVERY EVENING  30 tablet  1   traMADol (ULTRAM) 50 MG tablet Take 1 tablet (50 mg total) by mouth every 6 (six) hours as needed for pain.  20 tablet  0   No current facility-administered medications for this visit.   Family History  Problem Relation Age of Onset   Diabetes Mother    Hypertension Mother    Hypertension Father    Cancer Father     Lung CA   Coronary artery disease Father    History   Social History   Marital Status: Married    Spouse Name: N/A    Number of Children: N/A   Years of Education: N/A   Social History Main Topics   Smoking status: Never Smoker    Smokeless tobacco: Never Used   Alcohol Use: No   Drug Use: No   Sexual Activity: Not on file   Other Topics Concern   Not on file   Social History Narrative   Married, 3 daughters.   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   Objective:  Physical Exam: Filed Vitals:   04/15/13 1011  BP: 136/72  Pulse: 70  Temp: 97.7 F (36.5 C)  TempSrc: Oral  Weight: 176 lb 9.6 oz (80.105 kg)  SpO2: 99%   General appearance: alert, cooperative and no distress Head: Normocephalic, without obvious abnormality, atraumatic Eyes: conjunctivae/corneas clear. PERRL, EOM's intact. Fundi benign. Ears: normal exterior ears without  loss of hearing.  Nose: Nares normal. Septum midline. Mucosa normal. No drainage or sinus tenderness. Throat: lips, mucosa, and tongue normal; teeth and gums normal Back: no tenderness to percussion or palpation, lef lift test negative for sciatica.  Lungs: clear to auscultation bilaterally Heart: regular rate and rhythm Abdomen: soft, non-tender; bowel sounds normal; no masses,  no organomegaly Extremities: extremities normal, atraumatic, no cyanosis or edema Skin: erythematous lesions present on the right upper chest and right  anterior knee. The areas are similar in appearence and appear to be 1.5 inch round erythematous lesions with raised peripheral border. The margins are well demarcated and the lesion does not appear to be sloughing. They are not warm  to the touch.   Assessment & Plan:  Considering patients symptoms and clinical impression the most likely diagnosis is tenia corporis. Differential diagnosis include contact dermatitis however this is unlikely due to patient's reported history and lesion duration. Plan to treat assumed Tinea Corporis with Clotrimazole 1% until symptoms and lesion resolve as well as betamethasone for no longer than 7 days. Patient advised to Call in the next 2-3 weeks if lesions to not begin to improve and she will be referred to a dermatologist.   Back Pain- pain thought to be associated with OA exacerbated by recent PT for knee replacement. Patient advised to use warm compresses, ice packs an to gradually begin exercising to improve symptoms. Patient prescribed tramadol for temporary pain alleviation.  Disposition: Patient will call in 2-3 weeks if lesions do not resolve with antifungal cream and will require referral to dermatologist.

## 2013-04-15 NOTE — Patient Instructions (Signed)
It was a pleasure to meet you today! - We will prescribe a topical antifungal cream and topical steroid cream. Do not use the steroid cream for more than 7 days. You may continue to use the antifungal cream until the areas heal completely.  - If the areas do not start to improve in the next 2 weeks, please call to let us know and we will refer you to a dermatologist.  - Your back pain is likely associated with osteoarthritis. We will prescribe you a pain medication to take as needed for symptoms. You can also apply ice and heat to the area as needed. Continue to gradually increase exercise in the future.

## 2013-04-15 NOTE — Progress Notes (Signed)
Subjective:     Patient ID: DANICA CAMARENA, female   DOB: [birth year], 62 y.o.   MRN: [id number]  Back Pain   Ms Ow is a 62 year old female who comes in with the chief complaint of a rash present for the past 2.5 weeks, and has spread to another location where a new rash erupted past Sunday. She suspects a young child in her household had fungal infection and she contracted it from there. The rash is extremely itchy.   The patient also complains about leg pain which starts in her calves and travels upwards to her back. She has osteoarthrtis in left knee and right hip. She denies any trauma, or new medications.  Overall, she denies new chest pain, palpitations, shortness of breath, diarrhea, constipation, dizziness, lightheadedness.   Review of Systems  Musculoskeletal: Positive for back pain.   As per HPI    Objective:   Physical Exam  Skin:     Skin rash, at 2 locations as shown. About 1-2 cm diameter at both places. Erythematous round lesions, with firm indurated well demarcated borders, no scaling, no silvery patches, no velvety appearance.        Assessment:     Tinea Corporis - Clotrimazole 1% skin cream to apply. Betamethasone oint 0.1% to use only in the period of intense itch. Not to be used for more than 7 days at a stetch, explained to patient.  If the patient does not see ANY improvement what so ever in 2 weeks and none in 4 weeks, it is likely there is alternative diagnosis and referral to dermatogy needs to given.   Leg pain + back pain - Tramadol for now, patient reports no relief from Advil like treatments.

## 2013-04-20 ENCOUNTER — Encounter: Payer: Self-pay | Admitting: Gastroenterology

## 2013-05-01 ENCOUNTER — Other Ambulatory Visit: Payer: Self-pay | Admitting: *Deleted

## 2013-05-01 MED ORDER — PRAVASTATIN SODIUM 40 MG PO TABS: ORAL_TABLET | ORAL | Status: DC

## 2013-05-01 MED ORDER — LISINOPRIL 20 MG PO TABS: ORAL_TABLET | ORAL | Status: DC

## 2013-05-01 MED ORDER — METFORMIN HCL 1000 MG PO TABS: ORAL_TABLET | ORAL | Status: DC

## 2013-05-26 ENCOUNTER — Encounter: Payer: Self-pay | Admitting: Internal Medicine

## 2013-05-26 ENCOUNTER — Ambulatory Visit (INDEPENDENT_AMBULATORY_CARE_PROVIDER_SITE_OTHER): Payer: BC Managed Care – PPO | Admitting: Internal Medicine

## 2013-05-26 VITALS — BP 153/77 | HR 70 | Temp 97.8°F | Ht 66.0 in | Wt 174.1 lb

## 2013-05-26 DIAGNOSIS — Z0000 Encounter for general adult medical examination without abnormal findings: Secondary | ICD-10-CM

## 2013-05-26 DIAGNOSIS — R609 Edema, unspecified: Secondary | ICD-10-CM

## 2013-05-26 DIAGNOSIS — E1149 Type 2 diabetes mellitus with other diabetic neurological complication: Secondary | ICD-10-CM

## 2013-05-26 DIAGNOSIS — M1990 Unspecified osteoarthritis, unspecified site: Secondary | ICD-10-CM

## 2013-05-26 DIAGNOSIS — Z23 Encounter for immunization: Secondary | ICD-10-CM

## 2013-05-26 DIAGNOSIS — I10 Essential (primary) hypertension: Secondary | ICD-10-CM

## 2013-05-26 MED ORDER — METFORMIN HCL 500 MG PO TABS: ORAL_TABLET | ORAL | Status: DC

## 2013-05-26 NOTE — Assessment & Plan Note (Signed)
Slightly elevated at 153/77 today. Patient has been compliant with her medications and is usually with low/normal pressure in the clinic.  -Will re-evaluate next visit and determine if a change needs to be made to her medications.

## 2013-05-26 NOTE — Assessment & Plan Note (Signed)
Still with pain, mostly lower back pain. She feels she is improving and is considering stopping use of the cane. She will follow up with Ortho on 06/24/13.  -No pain medication given at this time as patient is not expressing a significant amount of pain.

## 2013-05-26 NOTE — Progress Notes (Signed)
Patient ID: Renee Watts, female   DOB: November 02, 1950, 62 y.o.   MRN: [id number]   Subjective:   Patient ID: Renee Watts female   DOB: 04/21/1951 62 y.o.   MRN: [id number]  HPI: Renee Watts is a 62 y.o. female w/ PMHx of HTN, DM2, GERD, s/p b/l knee replacements (right ~11 years ago, left 12/2012), and total R hip arthroplasty (10/10/2012), presents to the clinic today for follow up. She says she has been feeling very well recently except for some mild, intermittent lower back pain. This has been an issue for her for quite some time as she has recently been trying to get back to normal after her most recent knee replacement and is currently using a cane to help her walk. She claims this pain is only when she bends over to pick something up, or has to be on her feet for a long period of time. She does seem to think it is getting better and feels she is actually getting stronger. She is attempting to stop using the cane. She is going to follow up with her orthopedic surgeon on 06/24/13. Otherwise, she has been feeling great. She does claim to have had an episode of dizziness yesterday morning. On further review, the patient has been shown to have some low blood sugars when she visits the clinic and her last HbA1c was 5.6 in July. She is currently taking Metformin 1000 mg bid. She denies any other symptoms such as headache, blurry vision, palpitations, diaphoresis, tremulousness, or lightheadedness.   Past Medical History  Diagnosis Date  . Hyperlipidemia   . Hypertension   . GERD (gastroesophageal reflux disease)   . Depression   . Arthritis   . Low back pain   . Osteoarthritis, knee     Bilateral, s/p 3 total knee replacement surgerues on right, last 2005. Dr. Shelle Iron  . Pellegrini-Steida syndrome     Myositis ossificans  . Obesity   . Dependent edema     Bilateral  . KNEE REPLACEMENT, HX OF 06/17/2006    Annotation: Right multiple re-do's 2003 & 2004 Qualifier: Diagnosis of  By:  Candis Musa MD, Ruben Reason.   . HYSTERECTOMY, HX OF 06/17/2006    Annotation: Secondary to Fibroids Qualifier: Diagnosis of  By: Candis Musa MD, Ruben Reason TONSILLECTOMY, HX OF 06/17/2006    Qualifier: Diagnosis of  By: Candis Musa MD, Veronique D.   . Diabetes mellitus     ORAL MEDICATION  . Shingles Jul 10 2012    LESIONS CLEARED BUT NOT TOLERATING ANY THING TIGHT AGAINST BODY--SHINGLES WERE AROSS BACK AND ABDOMEN  . Seasonal allergies    Current Outpatient Prescriptions  Medication Sig Dispense Refill  . aspirin 81 MG tablet Take 81 mg by mouth daily.      . betamethasone valerate ointment (VALISONE) 0.1 % Apply topically 2 (two) times daily.  30 g  0  . clotrimazole (LOTRIMIN) 1 % cream Apply topically 2 (two) times daily.  30 g  3  . ferrous sulfate (FERROUSUL) 325 (65 FE) MG tablet Take 1 tablet (325 mg total) by mouth daily with breakfast.  50 tablet  0  . furosemide (LASIX) 40 MG tablet Take 40 mg by mouth daily.      Marland Kitchen gabapentin (NEURONTIN) 100 MG capsule Take 300 mg by mouth at bedtime.      Marland Kitchen lisinopril (PRINIVIL,ZESTRIL) 20 MG tablet TAKE 1 TABLET BY MOUTH DAILY  30 tablet  6  . metFORMIN (GLUCOPHAGE) 500  MG tablet TAKE 1 TABLET BY MOUTH TWICE DAILY WITH A MEAL  60 tablet  6  . methocarbamol (ROBAXIN) 500 MG tablet Take 1 tablet (500 mg total) by mouth every 6 (six) hours as needed.  50 tablet  2  . omeprazole (PRILOSEC) 10 MG capsule Take 10 mg by mouth daily as needed.      Marland Kitchen oxyCODONE-acetaminophen (ROXICET) 5-325 MG per tablet Take 1-2 tablets by mouth every 4 (four) hours as needed for pain.  40 tablet  0  . pravastatin (PRAVACHOL) 40 MG tablet TAKE 1 TABLET BY MOUTH EVERY EVENING  30 tablet  6  . traMADol (ULTRAM) 50 MG tablet Take 1 tablet (50 mg total) by mouth every 6 (six) hours as needed for pain.  20 tablet  0   No current facility-administered medications for this visit.   Family History  Problem Relation Age of Onset  . Diabetes Mother   . Hypertension Mother    . Hypertension Father   . Cancer Father     Lung CA  . Coronary artery disease Father    History   Social History  . Marital Status: Married    Spouse Name: N/A    Number of Children: N/A  . Years of Education: 12th grade   Occupational History  .     Social History Main Topics  . Smoking status: Never Smoker   . Smokeless tobacco: Never Used  . Alcohol Use: No  . Drug Use: No  . Sexual Activity: None   Other Topics Concern  . None   Social History Narrative   Married, 3 daughters.   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  Respiratory: Denies SOB, DOE, cough, chest tightness, and wheezing.   Cardiovascular: Denies chest pain, palpitations and leg swelling.  Gastrointestinal: Denies nausea, vomiting, abdominal pain, diarrhea, constipation, blood in stool and abdominal distention.  Genitourinary: Denies dysuria, urgency, frequency, hematuria, flank pain and difficulty urinating.  Endocrine: Denies hot or cold intolerance, sweats, polyuria, polydipsia. Musculoskeletal: Denies myalgias, back pain, joint swelling, arthralgias and gait problem.  Skin: Denies pallor, rash and wounds.  Neurological: Denies dizziness, seizures, syncope, weakness, lightheadedness, numbness and headaches.  Psychiatric/Behavioral: Denies mood changes, confusion, nervousness, sleep disturbance and agitation.  Objective:  Physical Exam: Filed Vitals:   05/26/13 1040  BP: 153/77  Pulse: 70  Temp: 97.8 F (36.6 C)  TempSrc: Oral  Height: 5\' 6"  (1.676 m)  Weight: 174 lb 1.6 oz (78.971 kg)  SpO2: 98%   General: Vital signs reviewed. Patient is a well-developed and well-nourished, in no acute distress and cooperative with exam. Alert and oriented x3.  Head: Normocephalic and atraumatic.  Eyes: PERRL, EOMI, conjunctivae  normal, No scleral icterus.  Neck: Supple, trachea midline, normal ROM, No JVD, masses, thyromegaly, or carotid bruit present.  Cardiovascular: RRR, S1 normal, S2 normal, no murmurs, gallops, or rubs.  Pulmonary/Chest: normal respiratory effort, CTAB, no wheezes, rales, or rhonchi.  Abdominal: Soft. Non-tender, non-distended, bowel sounds are normal, no masses, organomegaly, or guarding present.  Musculoskeletal: No joint deformities, erythema, or stiffness, ROM full and no nontender. Bilateral scars on knees. L knee w/ minimal swelling, non tender on exam.  Extremities: Trace pitting edema in LE's bilaterally, pulses symmetric and intact bilaterally. No cyanosis or clubbing. Neurological: A&O x3, Strength is normal  and symmetric bilaterally, cranial nerve II-XII are grossly intact, no focal motor deficit, sensory intact to light touch bilaterally.  Skin: Warm, dry and intact. No rashes or erythema. Psychiatric: Normal mood and affect. speech and behavior is normal. Cognition and memory are normal.   Assessment & Plan:   Please see problem-based assessment and plan.

## 2013-05-26 NOTE — Assessment & Plan Note (Signed)
Mild leg edema still present. Patient says it is well controlled by Lasix 40 mg every other day. Will re-check BMET at next visit.

## 2013-05-26 NOTE — Patient Instructions (Signed)
1. Please schedule a follow up appointment for 6 weeks.  2. Please take all medications as prescribed. Please start taking Metformin 500 mg twice daily, instead of 1000 mg twice daily 3. If you have worsening of your symptoms or new symptoms arise, please call the clinic [phone]), or go to the ER immediately if symptoms are severe.  You have done a great job in taking all your medications. I appreciate it very much. Please continue doing that.

## 2013-05-26 NOTE — Assessment & Plan Note (Signed)
Blood sugars well controlled lately, but did mention some morning dizziness. Last A1c was in July and was 5.6. She is currently on Metformin 1000 mg bid.  -Given her well controlled DM, decreased Metformin to 500 mg bid. She will follow up in 6 weeks for HbA1c re-check and evaluation of new Metformin dosage.

## 2013-05-26 NOTE — Assessment & Plan Note (Signed)
Given flu shot  today 

## 2013-05-29 NOTE — Progress Notes (Signed)
I saw and evaluated the patient.  I personally confirmed the key portions of the history and exam documented by Dr. Jones and I reviewed pertinent patient test results.  The assessment, diagnosis, and plan were formulated together and I agree with the documentation in the resident's note.   

## 2013-06-11 IMAGING — CR DG HIP 1V PORT*R*
1 series · 1 of 1 positions shown · non-contrast
Comparison: C-arm intraoperative films of 10/10/2012

CLINICAL DATA: Postop hip replacement

PORTABLE RIGHT HIP - 1 VIEW

[AP]
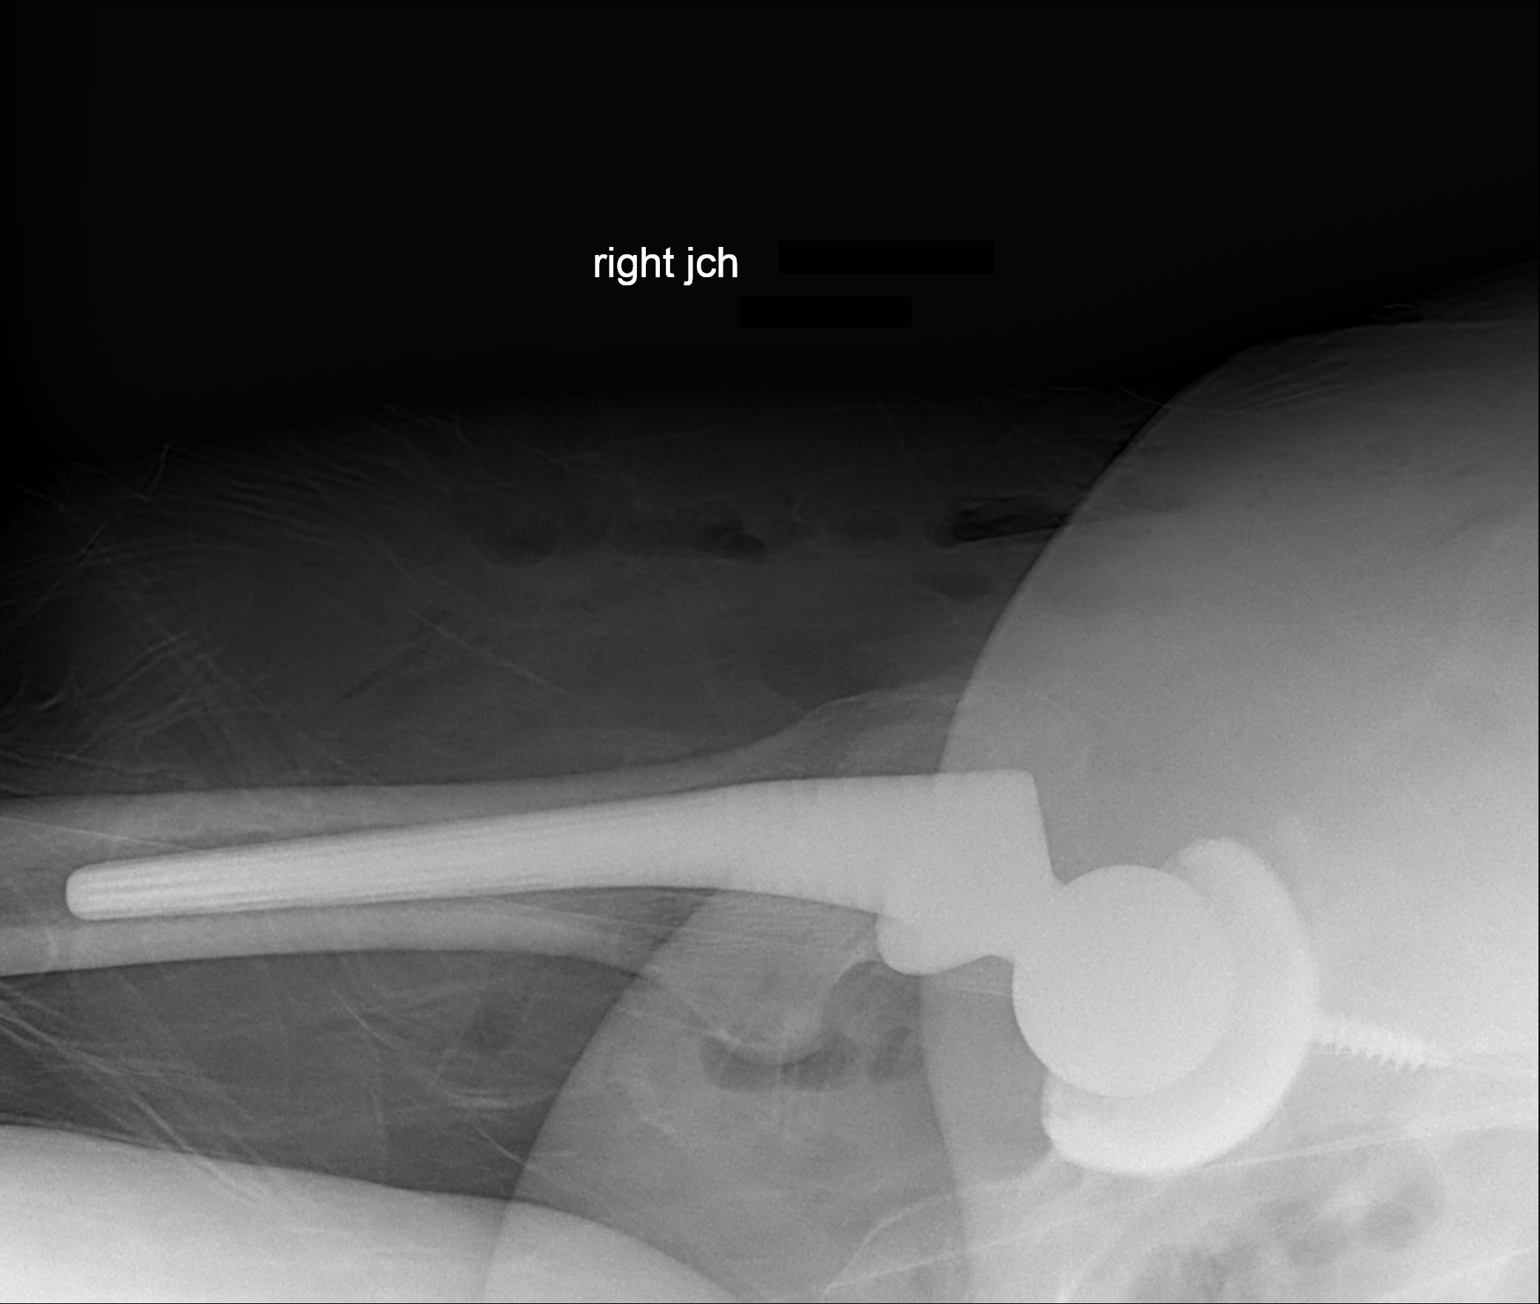

[1 of 1 positions shown; findings below may reference images not displayed]

FINDINGS: A cross-table lateral view shows the femoral and
acetabular components of the right total hip replacement to be in
good position.  Alignment is normal.  No complicating features are
seen.
IMPRESSION: Good position of right total hip replacement.  No complicating
features.

## 2013-06-25 ENCOUNTER — Encounter: Payer: Self-pay | Admitting: Internal Medicine

## 2013-06-25 ENCOUNTER — Ambulatory Visit (INDEPENDENT_AMBULATORY_CARE_PROVIDER_SITE_OTHER): Payer: BC Managed Care – PPO | Admitting: Internal Medicine

## 2013-06-25 VITALS — BP 121/74 | HR 77 | Temp 97.6°F | Wt 175.5 lb

## 2013-06-25 DIAGNOSIS — R42 Dizziness and giddiness: Secondary | ICD-10-CM

## 2013-06-25 DIAGNOSIS — H8110 Benign paroxysmal vertigo, unspecified ear: Secondary | ICD-10-CM

## 2013-06-25 DIAGNOSIS — E1149 Type 2 diabetes mellitus with other diabetic neurological complication: Secondary | ICD-10-CM

## 2013-06-25 HISTORY — DX: Benign paroxysmal vertigo, unspecified ear: H81.10

## 2013-06-25 LAB — GLUCOSE, CAPILLARY: Glucose-Capillary: 109 mg/dL — ABNORMAL HIGH (ref 70–99)

## 2013-06-25 LAB — POCT GLYCOSYLATED HEMOGLOBIN (HGB A1C): Hemoglobin A1C: 6

## 2013-06-25 MED ORDER — MECLIZINE HCL 25 MG PO TABS: 25.0000 mg | ORAL_TABLET | Freq: Two times a day (BID) | ORAL | Status: DC | PRN

## 2013-06-25 NOTE — Assessment & Plan Note (Addendum)
Assessment: Most like diagnosis is BPPV in view of clinical history and physical examination findings. However, Dix-Hallpike maneuver is negative. Ddx like posterior circulation deficit is unlikely given a normal neurological exam. Hypoglycemia is unlikely. Patient's metformin was decreased from 1000 mg bid to 500 mg twice a day. A1c is 6.0 today (from 5.7%). CBG in clinic 109. Medications have been reviewed. Besides Gabapentin 300 mg QHS, no other culprits for dizziness.  Plan: 1. Labs/imaging: none 2. Therapy: Meclizine 25 mg twice a day when necessary. Referral to vestibular rehabilitation 3. Keep appointment with PCP on 07/07/2013

## 2013-06-25 NOTE — Assessment & Plan Note (Signed)
CBG 109. A1c 6.0%. Continue with metformin 500 mg twice a day. Eye exam done today.

## 2013-06-25 NOTE — Progress Notes (Signed)
Patient ID: Renee Watts, female   DOB: Aug 05, 1951, 62 y.o.   MRN: [id number]   Subjective:   HPI: Renee Watts is a 62 y.o. woman with past medical history of hypertension, bilateral knee osteoarthritis s/p replacement and diabetes, presents to the clinic with complaints of dizziness for one week.  She describes sensation of feeling that the room is spinning. Symptoms increased with rolling over in bed, and sometimes when she stands up from a lying or seated position. She tries to open her eyes and the dizziness lasts about 10-20 seconds which helps the symptoms. She denies associated symptoms of nausea, vomiting, abdominal pain, headaches, auditory or visual changes. She does not report any other neurological symptoms accompanying this dizziness. She also denies history of chest pain, palpitations, shortness of breath, or any other cardiopulmonary symptoms. Her dizziness is not affected by bowel movements. No constitutional symptoms, like fevers, chills. No ear aches. She reports that several years ago, she experienced episodes of vertigo. She has never been evaluated by ENT. She reports that she gets several episodes throughout the day and night. No recent history of falls. Due to her bilateral osteoarthritis, and history of hip replacement, patient uses a cane at home.   No recent new medications. Her medical list includes Roxicet and tramadol but she reports that she's not been taking these medications for a several weeks to months. She takes gabapentin 300 mg at bedtime, and Lasix 40 mg as needed for leg edema. Her last dose of Lasix for 3 weeks ago.  Her blood sugar has been around 105 with a glucose meter.    Past Medical History  Diagnosis Date  . Hyperlipidemia   . Hypertension   . GERD (gastroesophageal reflux disease)   . Depression   . Arthritis   . Low back pain   . Osteoarthritis, knee     Bilateral, s/p 3 total knee replacement surgerues on right, last 2005. Dr.  Shelle Iron  . Pellegrini-Steida syndrome     Myositis ossificans  . Obesity   . Dependent edema     Bilateral  . KNEE REPLACEMENT, HX OF 06/17/2006    Annotation: Right multiple re-do's 2003 & 2004 Qualifier: Diagnosis of  By: Candis Musa MD, Ruben Reason.   . HYSTERECTOMY, HX OF 06/17/2006    Annotation: Secondary to Fibroids Qualifier: Diagnosis of  By: Candis Musa MD, Ruben Reason TONSILLECTOMY, HX OF 06/17/2006    Qualifier: Diagnosis of  By: Candis Musa MD, Veronique D.   . Diabetes mellitus     ORAL MEDICATION  . Shingles Jul 10 2012    LESIONS CLEARED BUT NOT TOLERATING ANY THING TIGHT AGAINST BODY--SHINGLES WERE AROSS BACK AND ABDOMEN  . Seasonal allergies    Current Outpatient Prescriptions  Medication Sig Dispense Refill  . aspirin 81 MG tablet Take 81 mg by mouth daily.      . ferrous sulfate (FERROUSUL) 325 (65 FE) MG tablet Take 1 tablet (325 mg total) by mouth daily with breakfast.  50 tablet  0  . furosemide (LASIX) 40 MG tablet Take 40 mg by mouth daily.      Marland Kitchen gabapentin (NEURONTIN) 100 MG capsule Take 300 mg by mouth at bedtime.      Marland Kitchen lisinopril (PRINIVIL,ZESTRIL) 20 MG tablet TAKE 1 TABLET BY MOUTH DAILY  30 tablet  6  . metFORMIN (GLUCOPHAGE) 500 MG tablet TAKE 1 TABLET BY MOUTH TWICE DAILY WITH A MEAL  60 tablet  6  . methocarbamol (ROBAXIN) 500 MG  tablet Take 1 tablet (500 mg total) by mouth every 6 (six) hours as needed.  50 tablet  2  . omeprazole (PRILOSEC) 10 MG capsule Take 10 mg by mouth daily as needed.      . pravastatin (PRAVACHOL) 40 MG tablet TAKE 1 TABLET BY MOUTH EVERY EVENING  30 tablet  6  . meclizine (ANTIVERT) 25 MG tablet Take 1 tablet (25 mg total) by mouth 2 (two) times daily as needed.  60 tablet  0   No current facility-administered medications for this visit.   Family History  Problem Relation Age of Onset  . Diabetes Mother   . Hypertension Mother   . Hypertension Father   . Cancer Father     Lung CA  . Coronary artery disease Father     History   Social History  . Marital Status: Married    Spouse Name: N/A    Number of Children: N/A  . Years of Education: 12th grade   Occupational History  .     Social History Main Topics  . Smoking status: Never Smoker   . Smokeless tobacco: Never Used  . Alcohol Use: No  . Drug Use: No  . Sexual Activity: None   Other Topics Concern  . None   Social History Narrative   Married, 3 daughters.   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  Respiratory: Denies SOB, DOE, cough, chest tightness, and wheezing. Denies chest pain. Cardiovascular: No chest pain, palpitations and leg swelling.  Gastrointestinal: No abdominal pain, nausea, vomiting, bloody stools Genitourinary: No dysuria, frequency, hematuria, or flank pain.  Psych: No depression symptoms. No SI or SA.   Objective:  Physical Exam: Filed Vitals:   06/25/13 0825 06/25/13 0836 06/25/13 0837 06/25/13 0838  BP: 124/70 125/69 122/75 121/74  Pulse: 80 79 76 77  Temp: 97.6 F (36.4 C)     TempSrc: Oral     Weight: 175 lb 8 oz (79.606 kg)     SpO2: 98%      Orthostatic signs are negative. General: Well nourished. No acute distress.  HEENT: Normal oral mucosa. MMM. Oropharynx is clear without exudates, or erythema. Ear exam, including hearing testing is normal bilaterally. Lungs: CTA bilaterally without wheezing. Heart: RRR; no extra sounds or murmurs. No JVD Abdomen: Non-distended, normal BS, soft, nontender; no hepatosplenomegaly  Extremities: Mild bilateral knee tenderness. No joint swelling or tenderness in the upper or lower extremities.  Neurologic Exam:   Mental Status: Alert, oriented, thought content appropriate.  Speech fluent without evidence of aphasia.  Cranial Nerves:      III/IV/VI: Extraocular movements intact.  Pupils  reactive bilaterally.  V/VII: Smile symmetric. facial light touch sensation normal bilaterally.  VIII: Grossly intact.     XI: Bilateral shoulder shrug normal.  XII: Midline tongue extension normal.  Motor:  5/5 bilaterally with normal tone and bulk  Sensory:  Light touch intact throughout, bilaterally  DTRs: 2+ and symmetric throughout  Plantars:  Downgoing bilaterally  Cerebellar:  Pronator drip   Hix Hallpike Maneuver  Normal finger-to-nose, normal rapid alternating movements and normal heel-to-shin test.  Antalgic gait.   normal    No horizontal or vertical nystagmus. No report of increased symptoms.    Romberg sign -negative       Assessment & Plan:  I  have discussed my assessment and plan  with  my attending in the clinic, Dr. Dalphine Handing  as detailed under problem based charting.

## 2013-06-25 NOTE — Patient Instructions (Addendum)
Please take Meclizine 25 mg twice daily as needed for dizzines. Please continue with the rest of you medications  I will refer you to [facility] today When to call clinic   Your medicines do not help or make you feel worse.  You have trouble talking or walking.  You feel weak or have trouble using your arms, hands, or legs.  You have bad headaches.  You keep feeling sick to your stomach (nauseous) or throwing up (vomiting).  Your vision changes.  A family member notices changes in your behavior.  Your problems get worse. Keep your appointment with Dr Dorise Hiss on 07/07/2013    Vertigo Vertigo means you feel like you are moving when you are not. Vertigo can make you feel like things around you are moving when they are not. This problem often goes away on its own.  HOME CARE   Follow your doctor's instructions.  Avoid driving.  Avoid using heavy machinery.  Avoid doing any activity that could be dangerous if you have a vertigo attack.  Tell your doctor if a medicine seems to cause your vertigo. GET HELP RIGHT AWAY IF:   Your medicines do not help or make you feel worse.  You have trouble talking or walking.  You feel weak or have trouble using your arms, hands, or legs.  You have bad headaches.  You keep feeling sick to your stomach (nauseous) or throwing up (vomiting).  Your vision changes.  A family member notices changes in your behavior.  Your problems get worse. MAKE SURE YOU:  Understand these instructions.  Will watch your condition.  Will get help right away if you are not doing well or get worse. Document Released: 05/22/2008 Document Revised: 11/05/2011 Document Reviewed: 03/01/2011 [facility] Patient Information 2014 [place], [place].

## 2013-07-01 ENCOUNTER — Encounter: Payer: Self-pay | Admitting: *Deleted

## 2013-07-01 NOTE — Progress Notes (Signed)
Case discussed with Dr.Kazibwe at the time of the visit.  We reviewed the resident's history and exam and pertinent patient test results.  I agree with the assessment, diagnosis, and plan of care documented in the resident's note.    

## 2013-07-07 ENCOUNTER — Encounter: Payer: Self-pay | Admitting: Internal Medicine

## 2013-07-07 ENCOUNTER — Ambulatory Visit (INDEPENDENT_AMBULATORY_CARE_PROVIDER_SITE_OTHER): Payer: BC Managed Care – PPO | Admitting: Internal Medicine

## 2013-07-07 VITALS — BP 118/72 | HR 78 | Temp 97.9°F | Ht 67.25 in | Wt 176.6 lb

## 2013-07-07 DIAGNOSIS — I10 Essential (primary) hypertension: Secondary | ICD-10-CM

## 2013-07-07 DIAGNOSIS — E1149 Type 2 diabetes mellitus with other diabetic neurological complication: Secondary | ICD-10-CM

## 2013-07-07 MED ORDER — GABAPENTIN 100 MG PO CAPS: 300.0000 mg | ORAL_CAPSULE | Freq: Every day | ORAL | Status: DC

## 2013-07-07 NOTE — Assessment & Plan Note (Signed)
Patient had eye exam at last visit, continue metformin 500 mg twice a day. She is on ACE inhibitor. Recent hemoglobin A1cis 6. She is at goal.

## 2013-07-07 NOTE — Patient Instructions (Signed)
General Instructions:  We will not change your medicines today and have sent in your refills. Keep up the good work with your exercising and portion control!  Keep walking at least 3 times per week.   Come back in 3 months for your check up or call us sooner if you are having problems. Our number is [phone].  Treatment Goals:  Goals (1 Years of Data) as of 07/07/13         As of Today 06/25/13 06/25/13 06/25/13 06/25/13     Blood Pressure    . Blood Pressure < 140/90  118/72 121/74 122/75 125/69 124/70     Result Component    . HEMOGLOBIN A1C < 7.0   6.0       . LDL CALC < 100            Progress Toward Treatment Goals:  Treatment Goal 07/07/2013  Hemoglobin A1C at goal  Blood pressure at goal    Self Care Goals & Plans:  Self Care Goal 07/07/2013  Manage my medications take my medicines as prescribed; bring my medications to every visit; refill my medications on time; follow the sick day instructions if I am sick  Monitor my health keep track of my blood glucose; bring my glucose meter and log to each visit; keep track of my blood pressure; check my feet daily  Eat healthy foods eat more vegetables; eat fruit for snacks and desserts; eat smaller portions; eat foods that are low in salt; eat baked foods instead of fried foods; drink diet soda or water instead of juice or soda  Be physically active find an activity I enjoy; take a walk every day  Meeting treatment goals -    Home Blood Glucose Monitoring 07/07/2013  Check my blood sugar no home glucose monitoring  When to check my blood sugar N/A     Care Management & Community Referrals:  Referral 07/07/2013  Referrals made for care management support none needed  Referrals made to community resources -

## 2013-07-07 NOTE — Progress Notes (Signed)
Subjective:     Patient ID: Renee Watts, female   DOB: 11/03/1950, 62 y.o.   MRN: [id number]  HPI The patient is a 62 year old female who has past medical history of diabetes type 2, osteoarthritis, hypertension, hyperlipidemia, GERD. She is here for a followup visit. She was recently here for some dizziness however states that that has resolved nicely. She has adapted to limiting sudden movements and head turning. She states she has not used meclizine in about one week. She states that otherwise she is doing very well and she has been working on losing weight. She has been walking at least 3 times per week. She has been increasing amount of vegetables in her diet and grilling more of her food. She does state that she occasionally has fried foods. She denies fevers, chills, chest pain, shortness of breath. She denies abdominal pain, nausea, vomiting, diarrhea. She requests a refill of her gabapentin at today's visit.  Review of Systems  Constitutional: Positive for activity change. Negative for fever, chills, diaphoresis, appetite change, fatigue and unexpected weight change.       Increased exercise  Respiratory: Negative for cough, choking, chest tightness, shortness of breath and wheezing.   Cardiovascular: Negative for chest pain, palpitations and leg swelling.  Gastrointestinal: Negative for nausea, vomiting, abdominal pain and diarrhea.  Musculoskeletal: Positive for arthralgias.       Some left knee pain s/p surgery  Neurological: Negative for dizziness, tremors, seizures, syncope, facial asymmetry, speech difficulty, weakness, light-headedness, numbness and headaches.       Objective:   Physical Exam  Constitutional: She is oriented to person, place, and time. She appears well-developed and well-nourished. No distress.  HENT:  Head: Normocephalic and atraumatic.  Eyes: EOM are normal. Pupils are equal, round, and reactive to light.  Neck: Normal range of motion. Neck supple. No  JVD present. No tracheal deviation present. No thyromegaly present.  Cardiovascular: Normal rate and regular rhythm.   No murmur heard. Pulmonary/Chest: Effort normal and breath sounds normal. No respiratory distress. She has no wheezes. She has no rales. She exhibits no tenderness.  Abdominal: Soft. Bowel sounds are normal. She exhibits no distension. There is no tenderness. There is no rebound and no guarding.  Musculoskeletal: Normal range of motion.  Fabric sleeve on left knee  Neurological: She is alert and oriented to person, place, and time. No cranial nerve deficit.  Skin: Skin is warm and dry. She is not diaphoretic.       Assessment/Plan:   1. Please see problem oriented charting.  2. Disposition-to be seen back with her PCP in about 3 months for recheck of hemoglobin A1c. She was encouraged to continue with her exercise regimen and dietary changes. She has already had her flu shot for this season.

## 2013-07-07 NOTE — Assessment & Plan Note (Signed)
Blood pressure of 118/72 is at goal at today's visit.

## 2013-07-08 NOTE — Addendum Note (Signed)
Addended by: Remus Blake on: 07/08/2013 09:14 AM   Modules accepted: Orders

## 2013-07-08 NOTE — Addendum Note (Signed)
Addended by: Marchel Foote K on: 07/08/2013 09:14 AM   Modules accepted: Orders  

## 2013-07-13 ENCOUNTER — Encounter: Payer: Self-pay | Admitting: Gastroenterology

## 2013-07-13 NOTE — Progress Notes (Signed)
Case discussed with Dr. Kollar at the time of the visit.  We reviewed the resident's history and exam and pertinent patient test results.  I agree with the assessment, diagnosis, and plan of care documented in the resident's note.     

## 2013-09-10 ENCOUNTER — Encounter: Payer: BC Managed Care – PPO | Admitting: Family Medicine

## 2013-09-10 ENCOUNTER — Ambulatory Visit (AMBULATORY_SURGERY_CENTER): Payer: Self-pay | Admitting: *Deleted

## 2013-09-10 ENCOUNTER — Encounter: Payer: Self-pay | Admitting: Family Medicine

## 2013-09-10 VITALS — Ht 66.0 in | Wt 185.6 lb

## 2013-09-10 DIAGNOSIS — Z1211 Encounter for screening for malignant neoplasm of colon: Secondary | ICD-10-CM

## 2013-09-10 DIAGNOSIS — E1129 Type 2 diabetes mellitus with other diabetic kidney complication: Secondary | ICD-10-CM | POA: Insufficient documentation

## 2013-09-10 MED ORDER — MOVIPREP 100 G PO SOLR: ORAL | Status: DC

## 2013-09-10 NOTE — Progress Notes (Signed)
No allergies to eggs or soy. No problems with anesthesia.  

## 2013-09-10 NOTE — Progress Notes (Signed)
Error   This encounter was created in error - please disregard. 

## 2013-09-11 ENCOUNTER — Telehealth: Payer: Self-pay

## 2013-09-11 NOTE — Telephone Encounter (Signed)
Relevant patient education mailed to patient.  

## 2013-09-24 ENCOUNTER — Encounter: Payer: BC Managed Care – PPO | Admitting: Gastroenterology

## 2013-10-12 ENCOUNTER — Encounter: Payer: Self-pay | Admitting: Family Medicine

## 2013-10-12 ENCOUNTER — Ambulatory Visit (INDEPENDENT_AMBULATORY_CARE_PROVIDER_SITE_OTHER): Payer: BC Managed Care – PPO | Admitting: Family Medicine

## 2013-10-12 VITALS — BP 136/84 | Temp 97.6°F | Ht 66.0 in | Wt 189.0 lb

## 2013-10-12 DIAGNOSIS — I10 Essential (primary) hypertension: Secondary | ICD-10-CM

## 2013-10-12 DIAGNOSIS — R42 Dizziness and giddiness: Secondary | ICD-10-CM

## 2013-10-12 DIAGNOSIS — D649 Anemia, unspecified: Secondary | ICD-10-CM

## 2013-10-12 DIAGNOSIS — E785 Hyperlipidemia, unspecified: Secondary | ICD-10-CM

## 2013-10-12 DIAGNOSIS — E1129 Type 2 diabetes mellitus with other diabetic kidney complication: Secondary | ICD-10-CM

## 2013-10-12 DIAGNOSIS — E1142 Type 2 diabetes mellitus with diabetic polyneuropathy: Secondary | ICD-10-CM

## 2013-10-12 DIAGNOSIS — E1149 Type 2 diabetes mellitus with other diabetic neurological complication: Secondary | ICD-10-CM

## 2013-10-12 DIAGNOSIS — K219 Gastro-esophageal reflux disease without esophagitis: Secondary | ICD-10-CM

## 2013-10-12 DIAGNOSIS — H8110 Benign paroxysmal vertigo, unspecified ear: Secondary | ICD-10-CM

## 2013-10-12 LAB — LIPID PANEL
CHOL/HDL RATIO: 5
Cholesterol: 205 mg/dL — ABNORMAL HIGH (ref 0–200)
HDL: 42.7 mg/dL (ref >39.00)
Triglycerides: 150 mg/dL — ABNORMAL HIGH (ref 0.0–149.0)
VLDL: 30 mg/dL (ref 0.0–40.0)

## 2013-10-12 LAB — MICROALBUMIN / CREATININE URINE RATIO
Creatinine,U: 48.7 mg/dL
Microalb Creat Ratio: 5.7 mg/g (ref 0.0–30.0)
Microalb, Ur: 2.8 mg/dL — ABNORMAL HIGH (ref 0.0–1.9)

## 2013-10-12 LAB — CBC WITH DIFFERENTIAL/PLATELET
BASOS ABS: 0 10*3/uL (ref 0.0–0.1)
BASOS PCT: 0.5 % (ref 0.0–3.0)
Eosinophils Absolute: 0.3 10*3/uL (ref 0.0–0.7)
Eosinophils Relative: 4.8 % (ref 0.0–5.0)
HEMATOCRIT: 37.7 % (ref 36.0–46.0)
Hemoglobin: 12.3 g/dL (ref 12.0–15.0)
LYMPHS ABS: 2.2 10*3/uL (ref 0.7–4.0)
Lymphocytes Relative: 32.4 % (ref 12.0–46.0)
MCHC: 32.6 g/dL (ref 30.0–36.0)
MCV: 92.9 fl (ref 78.0–100.0)
MONOS PCT: 8.3 % (ref 3.0–12.0)
Monocytes Absolute: 0.6 10*3/uL (ref 0.1–1.0)
Neutro Abs: 3.6 10*3/uL (ref 1.4–7.7)
Neutrophils Relative %: 54 % (ref 43.0–77.0)
Platelets: 191 10*3/uL (ref 150.0–400.0)
RBC: 4.06 Mil/uL (ref 3.87–5.11)
RDW: 14.2 % (ref 11.5–14.6)
WBC: 6.7 10*3/uL (ref 4.5–10.5)

## 2013-10-12 LAB — BASIC METABOLIC PANEL
BUN: 12 mg/dL (ref 6–23)
CALCIUM: 9.4 mg/dL (ref 8.4–10.5)
CO2: 27 meq/L (ref 19–32)
Chloride: 105 mEq/L (ref 96–112)
Creatinine, Ser: 0.6 mg/dL (ref 0.4–1.2)
GFR: 122.51 mL/min (ref >60.00)
Glucose, Bld: 90 mg/dL (ref 70–99)
POTASSIUM: 4.3 meq/L (ref 3.5–5.1)
SODIUM: 140 meq/L (ref 135–145)

## 2013-10-12 LAB — LDL CHOLESTEROL, DIRECT: Direct LDL: 137.8 mg/dL

## 2013-10-12 LAB — HEMOGLOBIN A1C: Hgb A1c MFr Bld: 5.9 % (ref 4.6–6.5)

## 2013-10-12 MED ORDER — PRAVASTATIN SODIUM 40 MG PO TABS: ORAL_TABLET | ORAL | Status: DC

## 2013-10-12 MED ORDER — MECLIZINE HCL 25 MG PO TABS: 25.0000 mg | ORAL_TABLET | Freq: Two times a day (BID) | ORAL | Status: DC | PRN

## 2013-10-12 MED ORDER — LISINOPRIL 20 MG PO TABS: ORAL_TABLET | ORAL | Status: DC

## 2013-10-12 MED ORDER — GABAPENTIN 100 MG PO CAPS: 300.0000 mg | ORAL_CAPSULE | Freq: Every day | ORAL | Status: DC

## 2013-10-12 NOTE — Patient Instructions (Signed)
-  We have ordered labs or studies at this visit. It can take up to 1-2 weeks for results and processing. We will contact you with instructions IF your results are abnormal. Normal results will be released to your [facility]. If you have not heard from [place] or can not find your results in [facility] in 2 weeks please contact our office.  -PLEASE SIGN UP FOR MYCHART TODAY   We recommend the following healthy lifestyle measures: - eat a healthy diet consisting of lots of vegetables, fruits, beans, nuts, seeds, healthy meats such as white chicken and fish and whole grains.  - avoid fried foods, fast food, processed foods, sodas, red meet and other fattening foods.  - get a least 150 minutes of aerobic exercise per week.   See your orthopedic doctor about your knee pain  If vertigo persist follow up sooner  Check on shingles vaccine  Follow up in: 2 months

## 2013-10-12 NOTE — Progress Notes (Signed)
Pre visit review using our clinic review tool, if applicable. No additional management support is needed unless otherwise documented below in the visit note. 

## 2013-10-12 NOTE — Progress Notes (Signed)
Chief Complaint  Patient presents with  . Establish Care    HPI:  ARABELA BASALDUA is here to establish care. This is much more convenient for her in terms in location. Last PCP and physical: does not have gyn, s/p hysterectomy, last physical with PCP in Oct.  Has the following chronic problems and concerns today:  Patient Active Problem List   Diagnosis Date Noted  . Diabetes mellitus with renal manifestation 09/10/2013  . Anemia 03/12/2013  . Diabetic peripheral neuropathy 09/01/2012  . HYPERLIPIDEMIA 09/16/2006  . HYPERTENSION 09/16/2006  . GERD 09/16/2006  . Type II or unspecified type diabetes mellitus with neurological manifestations, not stated as uncontrolled(250.60) 06/17/2006  . OSTEOARTHRITIS 06/17/2006  . DEPENDENT EDEMA, LEGS, BILATERAL 06/17/2006   Reports last year had positional vertigo, meclizine helped. Had some issues with this the last week or two - needs refill. Has had multiple orthopedic surgeries and has persistent L knee pain since that surgery - wonders if should see her orthopedic doctor.  ROS: See pertinent positives and negatives per HPI.  Past Medical History  Diagnosis Date  . Hyperlipidemia   . Hypertension   . GERD (gastroesophageal reflux disease)   . Depression   . Arthritis   . Low back pain   . Osteoarthritis, knee     Bilateral, s/p 3 total knee replacement surgerues on right, last 2005. Dr. Tonita Cong  . Pellegrini-Steida syndrome     Myositis ossificans  . Obesity   . Dependent edema     Bilateral  . KNEE REPLACEMENT, HX OF 06/17/2006    Annotation: Right multiple re-do's 2003 & 2004 Qualifier: Diagnosis of  By: Tomasa Hosteller MD, Edmon Crape.   . HYSTERECTOMY, HX OF 06/17/2006    Annotation: Secondary to Fibroids Qualifier: Diagnosis of  By: Tomasa Hosteller MD, [place], HX OF 06/17/2006    Qualifier: Diagnosis of  By: Tomasa Hosteller MD, Veronique D.   . Diabetes mellitus     ORAL MEDICATION  . Shingles Jul 10 2012    LESIONS  CLEARED BUT NOT TOLERATING ANY THING TIGHT AGAINST BODY--SHINGLES WERE AROSS BACK AND ABDOMEN  . Seasonal allergies   . Arthritis of knee, left 01/09/2013  . Benign paroxysmal positional vertigo 06/25/2013  . Blood in stool 03/11/2012  . Bursitis of hip, right 05/07/2012  . S/P total hip arthroplasty 10/30/2012    10/10/2012 and underwent Procedure(s):  TOTAL HIP ARTHROPLASTY ANTERIOR APPROACH.   for degenerative arthritis of hip   . Shingles rash 07/10/2012    Family History  Problem Relation Age of Onset  . Diabetes Mother   . Hypertension Mother   . Hypertension Father   . Cancer Father     Lung CA  . Coronary artery disease Father   . Colon cancer Neg Hx     History   Social History  . Marital Status: Married    Spouse Name: N/A    Number of Children: N/A  . Years of Education: 12th grade   Occupational History  .     Social History Main Topics  . Smoking status: Never Smoker   . Smokeless tobacco: Never Used  . Alcohol Use: No  . Drug Use: No  . Sexual Activity: None   Other Topics Concern  . None   Social History Narrative   Married, 3 daughters.   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   Nicotine: never   ETOH: no   Drugs: no  Current outpatient prescriptions:aspirin 81 MG tablet, Take 81 mg by mouth daily., Disp: , Rfl: ;  furosemide (LASIX) 40 MG tablet, Take 40 mg by mouth daily., Disp: , Rfl: ;  gabapentin (NEURONTIN) 100 MG capsule, Take 3 capsules (300 mg total) by mouth at bedtime., Disp: 90 capsule, Rfl: 3;  lisinopril (PRINIVIL,ZESTRIL) 20 MG tablet, TAKE 1 TABLET BY MOUTH DAILY, Disp: 90 tablet, Rfl: 0 metFORMIN (GLUCOPHAGE) 500 MG tablet, TAKE 1 TABLET BY MOUTH TWICE DAILY WITH A MEAL, Disp: 60 tablet, Rfl: 6;  omeprazole (PRILOSEC) 10 MG capsule, Take 10 mg by mouth daily as needed., Disp: , Rfl: ;  pravastatin (PRAVACHOL) 40 MG tablet, TAKE 1 TABLET BY MOUTH EVERY EVENING, Disp: 90 tablet, Rfl: 3;  meclizine  (ANTIVERT) 25 MG tablet, Take 1 tablet (25 mg total) by mouth 2 (two) times daily as needed., Disp: 60 tablet, Rfl: 0  EXAM:  Filed Vitals:   10/12/13 0934  BP: 136/84  Temp: 97.6 F (36.4 C)    Body mass index is 30.52 kg/(m^2).  GENERAL: vitals reviewed and listed above, alert, oriented, appears well hydrated and in no acute distress  HEENT: atraumatic, conjunttiva clear, no obvious abnormalities on inspection of external nose and ears  NECK: no obvious masses on inspection  LUNGS: clear to auscultation bilaterally, no wheezes, rales or rhonchi, good air movement  CV: HRRR, no peripheral edema  MS: moves all extremities without noticeable abnormality  PSYCH: pleasant and cooperative, no obvious depression or anxiety  ASSESSMENT AND PLAN:  Discussed the following assessment and plan:  HYPERLIPIDEMIA - Plan: Lipid Panel, pravastatin (PRAVACHOL) 40 MG tablet  HYPERTENSION - Plan: Basic metabolic panel, lisinopril (PRINIVIL,ZESTRIL) 20 MG tablet  Type II or unspecified type diabetes mellitus with neurological manifestations, not stated as uncontrolled(250.60)  GERD  Diabetic peripheral neuropathy - Plan: gabapentin (NEURONTIN) 100 MG capsule  Anemia - Plan: CBC with Differential  Diabetes mellitus with renal manifestation - Plan: Hemoglobin A1c, Microalbumin/Creatinine Ratio, Urine  Vertigo - Plan: meclizine (ANTIVERT) 25 MG tablet  Benign paroxysmal positional vertigo - Plan: meclizine (ANTIVERT) 25 MG tablet  Type II or unspecified type diabetes mellitus with neurological manifestations, not stated as uncontrolled   -We reviewed the PMH, PSH, FH, SH, Meds and Allergies. -We provided refills for any medications we will prescribe as needed. -We addressed current concerns per orders and patient instructions. -We have asked for records for pertinent exams, studies, vaccines and notes from previous providers. -We have advised patient to follow up per instructions  below.   -Patient advised to return or notify a doctor immediately if symptoms worsen or persist or new concerns arise.  There are no Patient Instructions on file for this visit.   Colin Benton R.

## 2013-10-14 MED ORDER — PRAVASTATIN SODIUM 40 MG PO TABS: ORAL_TABLET | ORAL | Status: DC

## 2013-10-14 NOTE — Addendum Note (Signed)
Addended by: Colleen Can on: 10/14/2013 11:55 AM   Modules accepted: Orders

## 2013-10-23 ENCOUNTER — Telehealth: Payer: Self-pay | Admitting: Family Medicine

## 2013-10-23 NOTE — Telephone Encounter (Signed)
Patient Information:  Caller Name: Tonilynn  Phone: [phone]  Patient: Renee Watts, Renee Watts  Gender: Female  DOB: Dec 05, 1950  Age: 63 Years  PCP: Maudie Mercury (TEXT 1st, after 20 mins can call), Jarrett Soho [facility])  Office Follow Up:  Does the office need to follow up with this patient?: Yes  Instructions For The Office: Patient request a different Cholesterol medication.  RN Note:  Please contact patient regarding side effects of pravastatin. Last dose  10/16/13.  Please advise.  Symptoms  Reason For Call & Symptoms: Patient states she placed on Pravastatin 60mg  daily-  On Monday 2/16 /15. She report GI upsets cramping, stomach "tied in knots".  She spoke with pharmacist and told her to stop until she spoke with physician.  Patient would like an alternative medication due to side effects/  Reviewed Health History In EMR: Yes  Reviewed Medications In EMR: Yes  Reviewed Allergies In EMR: Yes  Reviewed Surgeries / Procedures: Yes  Date of Onset of Symptoms: 10/19/2013  Guideline(s) Used:  No Protocol Available - Information Only  Disposition Per Guideline:   Discuss with PCP and Callback by Nurse Today  Reason For Disposition Reached:   Nursing judgment  Advice Given:  Call Back If:  New symptoms develop  You become worse.  RN Overrode Recommendation:  Patient Requests Prescription  Patient request a different Cholesterol medication.

## 2013-10-23 NOTE — Telephone Encounter (Signed)
Per Dr. Maudie Mercury Probably wasn't dose of medication since pt was recently on 40 mg pravastin.  Try another week on the 40 mg and then increase to the 60 mg. If this happens again pt needs to contact office.  Per Dr. Maudie Mercury this could have been something she ate or a stomach virus.

## 2013-10-23 NOTE — Telephone Encounter (Signed)
Called and explained the information to pt.  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  Pt will try Dr. Julianne Rice recommendations and call the office if she continues to have symptoms.

## 2013-11-07 ENCOUNTER — Emergency Department (HOSPITAL_COMMUNITY)
Admission: EM | Admit: 2013-11-07 | Discharge: 2013-11-07 | Disposition: A | Discharge status: Home / Self Care | Payer: BC Managed Care – PPO | Source: Home / Self Care | Attending: Family Medicine | Admitting: Family Medicine

## 2013-11-07 ENCOUNTER — Encounter (HOSPITAL_COMMUNITY): Payer: Self-pay | Admitting: Emergency Medicine

## 2013-11-07 ENCOUNTER — Emergency Department (INDEPENDENT_AMBULATORY_CARE_PROVIDER_SITE_OTHER): Payer: BC Managed Care – PPO

## 2013-11-07 DIAGNOSIS — M719 Bursopathy, unspecified: Secondary | ICD-10-CM

## 2013-11-07 DIAGNOSIS — M67919 Unspecified disorder of synovium and tendon, unspecified shoulder: Secondary | ICD-10-CM

## 2013-11-07 DIAGNOSIS — M7582 Other shoulder lesions, left shoulder: Principal | ICD-10-CM

## 2013-11-07 DIAGNOSIS — M778 Other enthesopathies, not elsewhere classified: Secondary | ICD-10-CM

## 2013-11-07 MED ORDER — DICLOFENAC SODIUM 1 % TD GEL: 4.0000 g | Freq: Four times a day (QID) | TRANSDERMAL | Status: DC

## 2013-11-07 NOTE — Discharge Instructions (Signed)
Sling , ice and medicine as needed, see orthopedist if further problems. °

## 2013-11-07 NOTE — ED Notes (Signed)
Pt  Reports  Pain  Down l  Arm  Off  And  On      For  3  Weeks          Pt  denys a  specefic  Injury           Symptoms  Not  releived  By otc  meds

## 2013-11-07 NOTE — ED Provider Notes (Signed)
CSN: [id number]     Arrival date & time 11/07/13  3704 History   First MD Initiated Contact with Patient 11/07/13 1029     Chief Complaint  Patient presents with  . Shoulder Pain   (Consider location/radiation/quality/duration/timing/severity/associated sxs/prior Treatment) Patient is a 63 y.o. female presenting with shoulder pain. The history is provided by the patient.  Shoulder Pain This is a new problem. The current episode started more than 1 week ago (3 wks after shoveling snow.). The problem has not changed since onset.Pertinent negatives include no chest pain and no abdominal pain.    Past Medical History  Diagnosis Date  . Hyperlipidemia   . Hypertension   . GERD (gastroesophageal reflux disease)   . Depression   . Arthritis   . Low back pain   . Osteoarthritis, knee     Bilateral, s/p 3 total knee replacement surgerues on right, last 2005. Dr. Tonita Cong  . Pellegrini-Steida syndrome     Myositis ossificans  . Obesity   . Dependent edema     Bilateral  . KNEE REPLACEMENT, HX OF 06/17/2006    Annotation: Right multiple re-do's 2003 & 2004 Qualifier: Diagnosis of  By: Tomasa Hosteller MD, Edmon Crape.   . HYSTERECTOMY, HX OF 06/17/2006    Annotation: Secondary to Fibroids Qualifier: Diagnosis of  By: Tomasa Hosteller MD, [place], HX OF 06/17/2006    Qualifier: Diagnosis of  By: Tomasa Hosteller MD, Veronique D.   . Diabetes mellitus     ORAL MEDICATION  . Shingles Jul 10 2012    LESIONS CLEARED BUT NOT TOLERATING ANY THING TIGHT AGAINST BODY--SHINGLES WERE AROSS BACK AND ABDOMEN  . Seasonal allergies   . Arthritis of knee, left 01/09/2013  . Benign paroxysmal positional vertigo 06/25/2013  . Blood in stool 03/11/2012  . Bursitis of hip, right 05/07/2012  . S/P total hip arthroplasty 10/30/2012    10/10/2012 and underwent Procedure(s):  TOTAL HIP ARTHROPLASTY ANTERIOR APPROACH.   for degenerative arthritis of hip   . Shingles rash 07/10/2012   Past Surgical History   Procedure Laterality Date  . Tubal ligation  1978  . Abdominal hysterectomy  1984  . Tonsillectomy  1977  . Total knee arthroplasty Right     with multiple revisions, 2003, 2005  . Wisdom tooth extraction  [id number]  . Total hip arthroplasty Right 10/10/2012    Procedure: R TOTAL HIP ARTHROPLASTY ANTERIOR APPROACH;  Surgeon: Mcarthur Rossetti, MD;  Location: [facility];  Service: Orthopedics;  Laterality: Right;  Right Total Hip Arthroplasty, Anterior Approach (C-Arm)  . Colonoscopy    . Total knee arthroplasty Left 01/09/2013    Procedure: LEFT TOTAL KNEE ARTHROPLASTY;  Surgeon: Mcarthur Rossetti, MD;  Location: [facility];  Service: Orthopedics;  Laterality: Left;   Family History  Problem Relation Age of Onset  . Diabetes Mother   . Hypertension Mother   . Hypertension Father   . Cancer Father     Lung CA  . Coronary artery disease Father   . Colon cancer Neg Hx    History  Substance Use Topics  . Smoking status: Never Smoker   . Smokeless tobacco: Never Used  . Alcohol Use: No   OB History   Grav Para Term Preterm Abortions TAB SAB Ect Mult Living                 Review of Systems  Constitutional: Negative.   Cardiovascular: Negative for chest pain.  Gastrointestinal: Negative for abdominal pain.  Musculoskeletal: Negative for back pain, joint swelling and neck pain.  Skin: Negative.     Allergies  Review of patient's allergies indicates no known allergies.  Home Medications   Current Outpatient Rx  Name  Route  Sig  Dispense  Refill  . aspirin 81 MG tablet   Oral   Take 81 mg by mouth daily.         . diclofenac sodium (VOLTAREN) 1 % GEL   Topical   Apply 4 g topically 4 (four) times daily. To shoulder  ,please instruct in dosing.   3 Tube   1   . furosemide (LASIX) 40 MG tablet   Oral   Take 40 mg by mouth daily.         Marland Kitchen gabapentin (NEURONTIN) 100 MG capsule   Oral   Take 3 capsules (300 mg total) by mouth at bedtime.   90 capsule   3    . lisinopril (PRINIVIL,ZESTRIL) 20 MG tablet      TAKE 1 TABLET BY MOUTH DAILY   90 tablet   0   . meclizine (ANTIVERT) 25 MG tablet   Oral   Take 1 tablet (25 mg total) by mouth 2 (two) times daily as needed.   60 tablet   0   . metFORMIN (GLUCOPHAGE) 500 MG tablet      TAKE 1 TABLET BY MOUTH TWICE DAILY WITH A MEAL   60 tablet   6     Patient is to take 500 mg bid.   Marland Kitchen omeprazole (PRILOSEC) 10 MG capsule   Oral   Take 10 mg by mouth daily as needed.         . pravastatin (PRAVACHOL) 40 MG tablet      TAKE 1 TABLET BY MOUTH EVERY EVENING   90 tablet   3   . pravastatin (PRAVACHOL) 40 MG tablet      Take 1.5 tablets daily.   135 tablet   1     New rx due to increase in dosing.  Thanks!    BP 132/79  Pulse 80  Temp(Src) 98.4 F (36.9 C) (Oral)  Resp 18  SpO2 95% Physical Exam  Nursing note and vitals reviewed. Constitutional: She is oriented to person, place, and time. She appears well-developed and well-nourished.  Musculoskeletal: She exhibits tenderness.       Left shoulder: She exhibits tenderness and pain. She exhibits normal range of motion, no bony tenderness, no swelling, no crepitus, normal pulse and normal strength.  Neurological: She is alert and oriented to person, place, and time.  Skin: Skin is warm.    ED Course  Procedures (including critical care time) Labs Review Labs Reviewed - No data to display Imaging Review Dg Shoulder Left  11/07/2013   CLINICAL DATA:  Left shoulder pain  EXAM: LEFT SHOULDER - 2+ VIEW  COMPARISON:  None.  FINDINGS: There is no evidence of fracture or dislocation. There is no evidence of arthropathy or other focal bone abnormality. Soft tissues are unremarkable.  IMPRESSION: No acute abnormality noted.   Electronically Signed   By: Inez Catalina M.D.   On: 11/07/2013 10:57     MDM   1. Tendonitis of shoulder, left        Billy Fischer, MD 11/07/13 1127

## 2013-11-16 ENCOUNTER — Other Ambulatory Visit: Payer: Self-pay

## 2013-11-16 DIAGNOSIS — Z1231 Encounter for screening mammogram for malignant neoplasm of breast: Secondary | ICD-10-CM

## 2013-11-17 ENCOUNTER — Ambulatory Visit (AMBULATORY_SURGERY_CENTER): Payer: BC Managed Care – PPO | Admitting: Gastroenterology

## 2013-11-17 ENCOUNTER — Encounter: Payer: Self-pay | Admitting: Gastroenterology

## 2013-11-17 VITALS — BP 121/71 | HR 63 | Temp 98.3°F | Resp 21 | Ht 66.0 in | Wt 185.0 lb

## 2013-11-17 DIAGNOSIS — D126 Benign neoplasm of colon, unspecified: Secondary | ICD-10-CM

## 2013-11-17 DIAGNOSIS — K5289 Other specified noninfective gastroenteritis and colitis: Secondary | ICD-10-CM

## 2013-11-17 DIAGNOSIS — K633 Ulcer of intestine: Secondary | ICD-10-CM

## 2013-11-17 DIAGNOSIS — Z1211 Encounter for screening for malignant neoplasm of colon: Secondary | ICD-10-CM

## 2013-11-17 LAB — GLUCOSE, CAPILLARY
Glucose-Capillary: 147 mg/dL — ABNORMAL HIGH (ref 70–99)
Glucose-Capillary: 126 mg/dL — ABNORMAL HIGH (ref 70–99)

## 2013-11-17 MED ORDER — SODIUM CHLORIDE 0.9 % IV SOLN: 500.0000 mL | INTRAVENOUS | Status: DC

## 2013-11-17 NOTE — Patient Instructions (Signed)
YOU HAD AN ENDOSCOPIC PROCEDURE TODAY AT THE [facility]: Refer to the procedure report that was given to you for any specific questions about what was found during the examination.  If the procedure report does not answer your questions, please call your gastroenterologist to clarify.  If you requested that your care partner not be given the details of your procedure findings, then the procedure report has been included in a sealed envelope for you to review at your convenience later.  YOU SHOULD EXPECT: Some feelings of bloating in the abdomen. Passage of more gas than usual.  Walking can help get rid of the air that was put into your GI tract during the procedure and reduce the bloating. If you had a lower endoscopy (such as a colonoscopy or flexible sigmoidoscopy) you may notice spotting of blood in your stool or on the toilet paper. If you underwent a bowel prep for your procedure, then you may not have a normal bowel movement for a few days.  DIET: Your first meal following the procedure should be a light meal and then it is ok to progress to your normal diet.  A half-sandwich or bowl of soup is an example of a good first meal.  Heavy or fried foods are harder to digest and may make you feel nauseous or bloated.  Likewise meals heavy in dairy and vegetables can cause extra gas to form and this can also increase the bloating.  Drink plenty of fluids but you should avoid alcoholic beverages for 24 hours.  ACTIVITY: Your care partner should take you home directly after the procedure.  You should plan to take it easy, moving slowly for the rest of the day.  You can resume normal activity the day after the procedure however you should NOT DRIVE or use heavy machinery for 24 hours (because of the sedation medicines used during the test).    SYMPTOMS TO REPORT IMMEDIATELY: A gastroenterologist can be reached at any hour.  During normal business hours, 8:30 AM to 5:00 PM Monday through Friday,  call [phone].  After hours and on weekends, please call the GI answering service at [phone] who will take a message and have the physician on call contact you.   Following lower endoscopy (colonoscopy or flexible sigmoidoscopy):  Excessive amounts of blood in the stool  Significant tenderness or worsening of abdominal pains  Swelling of the abdomen that is new, acute  Fever of 100F or higher    FOLLOW UP: If any biopsies were taken you will be contacted by phone or by letter within the next 1-3 weeks.  Call your gastroenterologist if you have not heard about the biopsies in 3 weeks.  Our staff will call the home number listed on your records the next business day following your procedure to check on you and address any questions or concerns that you may have at that time regarding the information given to you following your procedure. This is a courtesy call and so if there is no answer at the home number and we have not heard from you through the emergency physician on call, we will assume that you have returned to your regular daily activities without incident.  SIGNATURES/CONFIDENTIALITY: You and/or your care partner have signed paperwork which will be entered into your electronic medical record.  These signatures attest to the fact that that the information above on your After Visit Summary has been reviewed and is understood.  Full responsibility of the confidentiality   of this discharge information lies with you and/or your care-partner.     

## 2013-11-17 NOTE — Progress Notes (Signed)
Called to room to assist during endoscopic procedure.  Patient ID and intended procedure confirmed with present staff. Received instructions for my participation in the procedure from the performing physician.  

## 2013-11-17 NOTE — Progress Notes (Signed)
Lidocaine-40mg IV prior to Propofol InductionPropofol given over incremental dosages 

## 2013-11-17 NOTE — Op Note (Signed)
[place]  [address]. [place], [address]   COLONOSCOPY PROCEDURE REPORT  PATIENT: Renee Watts, Renee Watts.  MR#: [id number] BIRTHDATE: 04/15/1951 , 62  yrs. old GENDER: Female ENDOSCOPIST: Ladene Artist, MD, [facility] PROCEDURE DATE:  11/17/2013 PROCEDURE:   Colonoscopy with biopsy and snare polypectomy First Screening Colonoscopy - Avg.  risk and is 50 yrs.  old or older - No.  Prior Negative Screening - Now for repeat screening. 10 or more years since last screening  History of Adenoma - Now for follow-up colonoscopy & has been > or = to 3 yrs.  N/A  Polyps Removed Today? Yes. ASA CLASS:   Class II INDICATIONS:average risk screening. MEDICATIONS: MAC sedation, administered by CRNA and propofol (Diprivan) 250mg  IV DESCRIPTION OF PROCEDURE:   After the risks benefits and alternatives of the procedure were thoroughly explained, informed consent was obtained.  A digital rectal exam revealed external hemorrhoids.   The LB [id number]  endoscope was introduced through the anus and advanced to the cecum, which was identified by both the appendix and ileocecal valve. No adverse events experienced.   The quality of the prep was adequate, using MoviPrep The instrument was then slowly withdrawn as the colon was fully examined.  COLON FINDINGS: Two small, benign appearing ulcers were found at the cecum.  Multiple biopsies of the ulcers were performed.   Two sessile polyps measuring 5 mm in size were found in the ascending colon and descending colon.  A polypectomy was performed with a cold snare.  The resection was complete and the polyp tissue was completely retrieved.   The colon was otherwise normal.  There was no diverticulosis, inflammation, polyps or cancers unless previously stated.  Retroflexed views revealed moderate internal hemorrhoids. The time to cecum=2 minutes 15 seconds.  Withdrawal time=9 minutes 10 seconds.  The scope was withdrawn and the procedure  completed. COMPLICATIONS: There were no complications.  ENDOSCOPIC IMPRESSION: 1.   Small ulcers at the cecum; multiple biopsies performed 2.   Two sessile polyps measuring 5 mm in the ascending and descending colon; polypectomy performed with a cold snare 3.    Internal and external hemorrhoids  RECOMMENDATIONS: 1.  Await pathology results 2.  Repeat colonoscopy in 5 years if polyp(s) adenomatous; otherwise 10 years  eSigned:  Ladene Artist, MD, [facility] 11/17/2013 11:36 AM

## 2013-11-18 ENCOUNTER — Telehealth: Payer: Self-pay | Admitting: *Deleted

## 2013-11-18 NOTE — Telephone Encounter (Signed)
  Follow up Call-  Call back number 11/17/2013  Post procedure Call Back phone  # [phone]  Permission to leave phone message Yes     Patient questions:  Do you have a fever, pain , or abdominal swelling? no Pain Score  0 *  Have you tolerated food without any problems? yes  Have you been able to return to your normal activities? yes  Do you have any questions about your discharge instructions: Diet   no Medications  no Follow up visit  no  Do you have questions or concerns about your Care? no  Actions: * If pain score is 4 or above: No action needed, pain <4.

## 2013-11-27 ENCOUNTER — Encounter: Payer: Self-pay | Admitting: Gastroenterology

## 2013-12-07 ENCOUNTER — Ambulatory Visit
Admission: RE | Admit: 2013-12-07 | Discharge: 2013-12-07 | Disposition: A | Discharge status: Home / Self Care | Payer: BC Managed Care – PPO | Source: Ambulatory Visit

## 2013-12-07 DIAGNOSIS — Z1231 Encounter for screening mammogram for malignant neoplasm of breast: Secondary | ICD-10-CM

## 2013-12-10 ENCOUNTER — Ambulatory Visit (INDEPENDENT_AMBULATORY_CARE_PROVIDER_SITE_OTHER): Payer: BC Managed Care – PPO | Admitting: Family Medicine

## 2013-12-10 ENCOUNTER — Encounter: Payer: Self-pay | Admitting: Family Medicine

## 2013-12-10 VITALS — BP 120/80 | Temp 98.3°F | Wt 192.0 lb

## 2013-12-10 DIAGNOSIS — M719 Bursopathy, unspecified: Secondary | ICD-10-CM

## 2013-12-10 DIAGNOSIS — K219 Gastro-esophageal reflux disease without esophagitis: Secondary | ICD-10-CM

## 2013-12-10 DIAGNOSIS — E1149 Type 2 diabetes mellitus with other diabetic neurological complication: Secondary | ICD-10-CM

## 2013-12-10 DIAGNOSIS — M67919 Unspecified disorder of synovium and tendon, unspecified shoulder: Secondary | ICD-10-CM

## 2013-12-10 DIAGNOSIS — M7580 Other shoulder lesions, unspecified shoulder: Secondary | ICD-10-CM

## 2013-12-10 DIAGNOSIS — E785 Hyperlipidemia, unspecified: Secondary | ICD-10-CM

## 2013-12-10 DIAGNOSIS — I10 Essential (primary) hypertension: Secondary | ICD-10-CM

## 2013-12-10 NOTE — Progress Notes (Signed)
Pre visit review using our clinic review tool, if applicable. No additional management support is needed unless otherwise documented below in the visit note. 

## 2013-12-10 NOTE — Progress Notes (Signed)
Chief Complaint  Patient presents with  . Follow-up  . Tendonitis    HPI:  Follow up:  DM: -on metformin, asa, acei -BS: does great at home, usually 88-110 fasting -needs foot exam -diabetic neuropathy and takes gabapentin for this -trying to work on lifestyle  HLD: -increased pravastatin last visit - doing well on increased does  HTN: -on lisinopril, lasix  GERD: -on omeprazole  Shoulder tendonitis: -RTC -seeing ortho for this  Shingles vaccine?  ROS: See pertinent positives and negatives per HPI.  Past Medical History  Diagnosis Date  . Hyperlipidemia   . Hypertension   . GERD (gastroesophageal reflux disease)   . Depression   . Arthritis   . Low back pain   . Osteoarthritis, knee     Bilateral, s/p 3 total knee replacement surgerues on right, last 2005. Dr. Tonita Cong  . Pellegrini-Steida syndrome     Myositis ossificans  . Obesity   . Dependent edema     Bilateral  . Diabetes mellitus     ORAL MEDICATION  . Shingles Jul 10 2012    LESIONS CLEARED BUT NOT TOLERATING ANY THING TIGHT AGAINST BODY--SHINGLES WERE AROSS BACK AND ABDOMEN  . Seasonal allergies   . Benign paroxysmal positional vertigo 06/25/2013  . Blood in stool 03/11/2012  . Bursitis of hip, right 05/07/2012    Past Surgical History  Procedure Laterality Date  . Tubal ligation  1978  . Abdominal hysterectomy  1984  . Tonsillectomy  1977  . Total knee arthroplasty Right     with multiple revisions, 2003, 2005  . Wisdom tooth extraction  [id number]  . Total hip arthroplasty Right 10/10/2012    Procedure: R TOTAL HIP ARTHROPLASTY ANTERIOR APPROACH;  Surgeon: Mcarthur Rossetti, MD;  Location: [facility];  Service: Orthopedics;  Laterality: Right;  Right Total Hip Arthroplasty, Anterior Approach (C-Arm)  . Colonoscopy    . Total knee arthroplasty Left 01/09/2013    Procedure: LEFT TOTAL KNEE ARTHROPLASTY;  Surgeon: Mcarthur Rossetti, MD;  Location: [facility];  Service: Orthopedics;  Laterality:  Left;  . Left shoulder steroid injection      Family History  Problem Relation Age of Onset  . Diabetes Mother   . Hypertension Mother   . Hypertension Father   . Cancer Father     Lung CA  . Coronary artery disease Father   . Colon cancer Neg Hx     History   Social History  . Marital Status: Married    Spouse Name: N/A    Number of Children: N/A  . Years of Education: 12th grade   Occupational History  .     Social History Main Topics  . Smoking status: Never Smoker   . Smokeless tobacco: Never Used  . Alcohol Use: No  . Drug Use: No  . Sexual Activity: None   Other Topics Concern  . None   Social History Narrative   Married, 3 daughters.   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   Nicotine: never   ETOH: no   Drugs: no    Current outpatient prescriptions:aspirin 81 MG tablet, Take 81 mg by mouth daily., Disp: , Rfl: ;  diclofenac sodium (VOLTAREN) 1 % GEL, Apply 4 g topically 4 (four) times daily. To shoulder  ,please instruct in dosing., Disp: 3 Tube, Rfl: 1;  furosemide (LASIX) 40 MG tablet, Take 40 mg by mouth daily., Disp: , Rfl: ;  gabapentin (NEURONTIN) 100 MG capsule,  Take 3 capsules (300 mg total) by mouth at bedtime., Disp: 90 capsule, Rfl: 3 lisinopril (PRINIVIL,ZESTRIL) 20 MG tablet, TAKE 1 TABLET BY MOUTH DAILY, Disp: 90 tablet, Rfl: 0;  meclizine (ANTIVERT) 25 MG tablet, Take 1 tablet (25 mg total) by mouth 2 (two) times daily as needed., Disp: 60 tablet, Rfl: 0;  metFORMIN (GLUCOPHAGE) 500 MG tablet, TAKE 1 TABLET BY MOUTH TWICE DAILY WITH A MEAL, Disp: 60 tablet, Rfl: 6;  omeprazole (PRILOSEC) 10 MG capsule, Take 10 mg by mouth daily as needed., Disp: , Rfl:  pravastatin (PRAVACHOL) 40 MG tablet, Take 1.5 tablets daily., Disp: 135 tablet, Rfl: 1  EXAM:  Filed Vitals:   12/10/13 0927  BP: 120/80  Temp: 98.3 F (36.8 C)    Body mass index is 31 kg/(m^2).  GENERAL: vitals reviewed and listed above, alert,  oriented, appears well hydrated and in no acute distress  HEENT: atraumatic, conjunttiva clear, no obvious abnormalities on inspection of external nose and ears  NECK: no obvious masses on inspection  LUNGS: clear to auscultation bilaterally, no wheezes, rales or rhonchi, good air movement  CV: HRRR, no peripheral edema  MS: moves all extremities without noticeable abnormality  PSYCH: pleasant and cooperative, no obvious depression or anxiety  FOOT exam done:  ASSESSMENT AND PLAN:  Discussed the following assessment and plan:  Type II or unspecified type diabetes mellitus with neurological manifestations, not stated as uncontrolled(250.60)  HYPERLIPIDEMIA  HYPERTENSION  GERD  Rotator cuff tendonitis  -foot exam done and normal -advise dof [facility]/[place] program and she will think about this and follow up -lifestyle recs advise -follow up 3-4  months -Patient advised to return or notify a doctor immediately if symptoms worsen or persist or new concerns arise.  There are no Patient Instructions on file for this visit.   Renee Watts

## 2013-12-25 LAB — HM DIABETES EYE EXAM

## 2014-01-24 ENCOUNTER — Other Ambulatory Visit: Payer: Self-pay | Admitting: Family Medicine

## 2014-01-25 ENCOUNTER — Telehealth: Payer: Self-pay | Admitting: Family Medicine

## 2014-01-25 DIAGNOSIS — E1149 Type 2 diabetes mellitus with other diabetic neurological complication: Secondary | ICD-10-CM

## 2014-01-25 MED ORDER — METFORMIN HCL 500 MG PO TABS: ORAL_TABLET | ORAL | Status: DC

## 2014-01-25 NOTE — Telephone Encounter (Signed)
[id number] - , [name] is requesting re-fill on metFORMIN (GLUCOPHAGE) 500 MG tablet

## 2014-02-03 ENCOUNTER — Telehealth: Payer: Self-pay | Admitting: Family Medicine

## 2014-02-03 MED ORDER — GLUCOSE BLOOD VI STRP: ORAL_STRIP | Status: DC

## 2014-02-03 NOTE — Addendum Note (Signed)
Addended by: Agnes Lawrence on: 02/03/2014 10:33 AM   Modules accepted: Orders

## 2014-02-03 NOTE — Telephone Encounter (Signed)
Pt needs new rx test strips for one touch verio call into [facility]

## 2014-03-11 ENCOUNTER — Ambulatory Visit (INDEPENDENT_AMBULATORY_CARE_PROVIDER_SITE_OTHER): Payer: BC Managed Care – PPO | Admitting: Family Medicine

## 2014-03-11 ENCOUNTER — Encounter: Payer: Self-pay | Admitting: Family Medicine

## 2014-03-11 VITALS — BP 122/80 | HR 74 | Temp 98.8°F | Ht 66.0 in | Wt 199.5 lb

## 2014-03-11 DIAGNOSIS — I10 Essential (primary) hypertension: Secondary | ICD-10-CM

## 2014-03-11 DIAGNOSIS — M1990 Unspecified osteoarthritis, unspecified site: Secondary | ICD-10-CM

## 2014-03-11 DIAGNOSIS — E785 Hyperlipidemia, unspecified: Secondary | ICD-10-CM

## 2014-03-11 DIAGNOSIS — E1149 Type 2 diabetes mellitus with other diabetic neurological complication: Secondary | ICD-10-CM

## 2014-03-11 LAB — BASIC METABOLIC PANEL
BUN: 12 mg/dL (ref 6–23)
CALCIUM: 9.4 mg/dL (ref 8.4–10.5)
CO2: 29 mEq/L (ref 19–32)
CREATININE: 0.4 mg/dL (ref 0.4–1.2)
Chloride: 104 mEq/L (ref 96–112)
GFR: 241.1 mL/min (ref >60.00)
GLUCOSE: 101 mg/dL — AB (ref 70–99)
Potassium: 4.8 mEq/L (ref 3.5–5.1)
Sodium: 138 mEq/L (ref 135–145)

## 2014-03-11 LAB — LIPID PANEL
Cholesterol: 122 mg/dL (ref 0–200)
HDL: 37.5 mg/dL — ABNORMAL LOW (ref >39.00)
LDL CALC: 66 mg/dL (ref 0–99)
NonHDL: 84.5
Total CHOL/HDL Ratio: 3
Triglycerides: 92 mg/dL (ref 0.0–149.0)
VLDL: 18.4 mg/dL (ref 0.0–40.0)

## 2014-03-11 LAB — MICROALBUMIN / CREATININE URINE RATIO
CREATININE, U: 33.9 mg/dL
Microalb Creat Ratio: 1.5 mg/g (ref 0.0–30.0)
Microalb, Ur: 0.5 mg/dL (ref 0.0–1.9)

## 2014-03-11 LAB — HEMOGLOBIN A1C: HEMOGLOBIN A1C: 6.9 % — AB (ref 4.6–6.5)

## 2014-03-11 NOTE — Progress Notes (Signed)
Pre visit review using our clinic review tool, if applicable. No additional management support is needed unless otherwise documented below in the visit note. 

## 2014-03-11 NOTE — Patient Instructions (Signed)
-  We have ordered labs or studies at this visit. It can take up to 1-2 weeks for results and processing. We will contact you with instructions IF your results are abnormal. Normal results will be released to your [facility]. If you have not heard from [place] or can not find your results in [facility] in 2 weeks please contact our office.  -PLEASE SIGN UP FOR MYCHART TODAY   We recommend the following healthy lifestyle measures: - eat a healthy diet consisting of lots of vegetables, fruits, beans, nuts, seeds, healthy meats such as white chicken and fish and whole grains.  - avoid fried foods, fast food, processed foods, sodas, red meet and other fattening foods.  - get a least 150 minutes of aerobic exercise per week.   -tylenol 500-1000mg  up to 2-3 times daily -do the exercise provided -call if not improving for referral to back specialist  Follow up in: 3 months

## 2014-03-11 NOTE — Progress Notes (Signed)
No chief complaint on file.   HPI:  DM:  -on metformin $RemoveBefor'1000mg'[id number]$  bid, asa, acei  -BS: does great at home, usually 88-110 fasting   -diabetic neuropathy and takes gabapentin for this  -trying to work on lifestyle, advised of health coach option -foot exam: 11/2013 -denies: polyuria, polydipsia, vision changes, wounds of feet  HLD:  -increased pravastatin last visit - doing well on increased dose   HTN:  -on lisinopril, lasix  -denies: CP, SOB, swelling, palpitations, HA  GERD:  -on omeprazole  -denies: dysphagia  Shoulder tendonitis:  -seeing ortho for this   Chronic low back: -low back pain - for many years -reports evaluated by ortho in the past and told OA -denies: weakness, numbness, radiation, bowel or bladder incontinence  Shingles vaccine?   ROS: See pertinent positives and negatives per HPI.  Past Medical History  Diagnosis Date  . Hyperlipidemia   . Hypertension   . GERD (gastroesophageal reflux disease)   . Depression   . Arthritis   . Low back pain   . Osteoarthritis, knee     Bilateral, s/p 3 total knee replacement surgerues on right, last 2005. Dr. Tonita Cong  . Pellegrini-Steida syndrome     Myositis ossificans  . Obesity   . Dependent edema     Bilateral  . Diabetes mellitus     ORAL MEDICATION  . Shingles Jul 10 2012    LESIONS CLEARED BUT NOT TOLERATING ANY THING TIGHT AGAINST BODY--SHINGLES WERE AROSS BACK AND ABDOMEN  . Seasonal allergies   . Benign paroxysmal positional vertigo 06/25/2013  . Blood in stool 03/11/2012  . Bursitis of hip, right 05/07/2012    Past Surgical History  Procedure Laterality Date  . Tubal ligation  1978  . Abdominal hysterectomy  1984  . Tonsillectomy  1977  . Total knee arthroplasty Right     with multiple revisions, 2003, 2005  . Wisdom tooth extraction  [id number]  . Total hip arthroplasty Right 10/10/2012    Procedure: R TOTAL HIP ARTHROPLASTY ANTERIOR APPROACH;  Surgeon: Mcarthur Rossetti, MD;  Location:  [facility];  Service: Orthopedics;  Laterality: Right;  Right Total Hip Arthroplasty, Anterior Approach (C-Arm)  . Colonoscopy    . Total knee arthroplasty Left 01/09/2013    Procedure: LEFT TOTAL KNEE ARTHROPLASTY;  Surgeon: Mcarthur Rossetti, MD;  Location: [facility];  Service: Orthopedics;  Laterality: Left;  . Left shoulder steroid injection      Family History  Problem Relation Age of Onset  . Diabetes Mother   . Hypertension Mother   . Hypertension Father   . Cancer Father     Lung CA  . Coronary artery disease Father   . Colon cancer Neg Hx     History   Social History  . Marital Status: Married    Spouse Name: N/A    Number of Children: N/A  . Years of Education: 12th grade   Occupational History  .     Social History Main Topics  . Smoking status: Never Smoker   . Smokeless tobacco: Never Used  . Alcohol Use: No  . Drug Use: No  . Sexual Activity: None   Other Topics Concern  . None   Social History Narrative   Married, 3 daughters.   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   Nicotine: never   ETOH: no   Drugs: no    Current outpatient prescriptions:aspirin 81 MG tablet, Take 81 mg by mouth  daily., Disp: , Rfl: ;  Blood Glucose Monitoring Suppl (ONETOUCH VERIO IQ SYSTEM) W/DEVICE KIT, by Does not apply route., Disp: , Rfl: ;  furosemide (LASIX) 40 MG tablet, Take 40 mg by mouth daily., Disp: , Rfl: ;  gabapentin (NEURONTIN) 100 MG capsule, Take 3 capsules (300 mg total) by mouth at bedtime., Disp: 90 capsule, Rfl: 3 glucose blood (ONETOUCH VERIO) test strip, Use as instructed once a day to check blood sugar, Disp: 100 each, Rfl: 3;  glucose blood test strip, Use as directed to check blood sugar once a day, Disp: 100 each, Rfl: 3;  lisinopril (PRINIVIL,ZESTRIL) 20 MG tablet, TAKE 1 TABLET BY MOUTH EVERY DAY, Disp: 90 tablet, Rfl: 1;  meclizine (ANTIVERT) 25 MG tablet, Take 1 tablet (25 mg total) by mouth 2 (two) times daily as  needed., Disp: 60 tablet, Rfl: 0 metFORMIN (GLUCOPHAGE) 500 MG tablet, TAKE 1 TABLET BY MOUTH TWICE DAILY WITH A MEAL, Disp: 60 tablet, Rfl: 3;  omeprazole (PRILOSEC) 10 MG capsule, Take 10 mg by mouth daily as needed., Disp: , Rfl: ;  pravastatin (PRAVACHOL) 40 MG tablet, Take 1.5 tablets daily., Disp: 135 tablet, Rfl: 1  EXAM:  Filed Vitals:   03/11/14 1012  BP: 122/80  Pulse: 74  Temp: 98.8 F (37.1 C)    Body mass index is 32.22 kg/(m^2).  GENERAL: vitals reviewed and listed above, alert, oriented, appears well hydrated and in no acute distress  HEENT: atraumatic, conjunttiva clear, no obvious abnormalities on inspection of external nose and ears  NECK: no obvious masses on inspection  LUNGS: clear to auscultation bilaterally, no wheezes, rales or rhonchi, good air movement  CV: HRRR, no peripheral edema  MS: moves all extremities without noticeable abnormality  PSYCH: pleasant and cooperative, no obvious depression or anxiety  ASSESSMENT AND PLAN:  Discussed the following assessment and plan:  Type II or unspecified type diabetes mellitus with neurological manifestations, not stated as uncontrolled(250.60) - Plan: Hemoglobin D6U, Basic metabolic panel, Microalbumin/Creatinine Ratio, Urine  HYPERLIPIDEMIA - Plan: Lipid Panel  HYPERTENSION - Plan: Basic metabolic panel  OSTEOARTHRITIS  -Patient advised to return or notify a doctor immediately if symptoms worsen or persist or new concerns arise.  Patient Instructions  -We have ordered labs or studies at this visit. It can take up to 1-2 weeks for results and processing. We will contact you with instructions IF your results are abnormal. Normal results will be released to your [facility]. If you have not heard from [place] or can not find your results in [facility] in 2 weeks please contact our office.  -PLEASE SIGN UP FOR MYCHART TODAY   We recommend the following healthy lifestyle measures: - eat a healthy diet consisting of  lots of vegetables, fruits, beans, nuts, seeds, healthy meats such as white chicken and fish and whole grains.  - avoid fried foods, fast food, processed foods, sodas, red meet and other fattening foods.  - get a least 150 minutes of aerobic exercise per week.   -tylenol 500-$RemoveBefor'1000mg'ZORalIXSdWeJ$  up to 2-3 times daily -do the exercise provided -call if not improving for referral to back specialist  Follow up in: 3 months      Eron Staat R.

## 2014-03-12 ENCOUNTER — Telehealth: Payer: Self-pay | Admitting: Family Medicine

## 2014-03-12 NOTE — Telephone Encounter (Signed)
Relevant patient education mailed to patient.  

## 2014-03-24 ENCOUNTER — Other Ambulatory Visit: Payer: Self-pay | Admitting: Family Medicine

## 2014-03-24 DIAGNOSIS — E1129 Type 2 diabetes mellitus with other diabetic kidney complication: Secondary | ICD-10-CM

## 2014-03-24 DIAGNOSIS — E785 Hyperlipidemia, unspecified: Secondary | ICD-10-CM

## 2014-04-14 ENCOUNTER — Other Ambulatory Visit: Payer: Self-pay | Admitting: Family Medicine

## 2014-05-19 ENCOUNTER — Other Ambulatory Visit: Payer: Self-pay | Admitting: Family Medicine

## 2014-06-02 ENCOUNTER — Encounter: Payer: Self-pay | Admitting: *Deleted

## 2014-06-11 ENCOUNTER — Ambulatory Visit: Payer: BC Managed Care – PPO | Admitting: Family Medicine

## 2014-06-14 ENCOUNTER — Encounter: Payer: Self-pay | Admitting: Family Medicine

## 2014-06-14 ENCOUNTER — Ambulatory Visit (INDEPENDENT_AMBULATORY_CARE_PROVIDER_SITE_OTHER): Payer: BC Managed Care – PPO | Admitting: Family Medicine

## 2014-06-14 VITALS — BP 122/80 | HR 71 | Temp 98.4°F | Ht 66.0 in | Wt 198.0 lb

## 2014-06-14 DIAGNOSIS — I10 Essential (primary) hypertension: Secondary | ICD-10-CM

## 2014-06-14 DIAGNOSIS — E119 Type 2 diabetes mellitus without complications: Secondary | ICD-10-CM

## 2014-06-14 DIAGNOSIS — R42 Dizziness and giddiness: Secondary | ICD-10-CM

## 2014-06-14 DIAGNOSIS — K219 Gastro-esophageal reflux disease without esophagitis: Secondary | ICD-10-CM

## 2014-06-14 DIAGNOSIS — E785 Hyperlipidemia, unspecified: Secondary | ICD-10-CM

## 2014-06-14 DIAGNOSIS — Z23 Encounter for immunization: Secondary | ICD-10-CM

## 2014-06-14 DIAGNOSIS — M545 Low back pain: Secondary | ICD-10-CM

## 2014-06-14 LAB — BASIC METABOLIC PANEL
BUN: 11 mg/dL (ref 6–23)
CALCIUM: 9.6 mg/dL (ref 8.4–10.5)
CO2: 30 mEq/L (ref 19–32)
Chloride: 103 mEq/L (ref 96–112)
Creatinine, Ser: 0.4 mg/dL (ref 0.4–1.2)
GFR: 200.7 mL/min (ref >60.00)
GLUCOSE: 130 mg/dL — AB (ref 70–99)
POTASSIUM: 4.2 meq/L (ref 3.5–5.1)
SODIUM: 141 meq/L (ref 135–145)

## 2014-06-14 LAB — HEMOGLOBIN A1C: HEMOGLOBIN A1C: 6.2 % (ref 4.6–6.5)

## 2014-06-14 MED ORDER — MECLIZINE HCL 25 MG PO TABS: 25.0000 mg | ORAL_TABLET | Freq: Two times a day (BID) | ORAL | Status: DC | PRN

## 2014-06-14 NOTE — Addendum Note (Signed)
Addended by: Agnes Lawrence on: 06/14/2014 11:29 AM   Modules accepted: Orders

## 2014-06-14 NOTE — Patient Instructions (Addendum)
BEFORE YOU LEAVE: -flu shot -fasting labs -schedule follow up in 3 months  We recommend the following healthy lifestyle measures: - eat a healthy diet consisting of lots of vegetables, fruits, beans, nuts, seeds, healthy meats such as white chicken and fish and whole grains.  - avoid fried foods, fast food, processed foods, sodas, red meet and other fattening foods.  - get a least 150 minutes of aerobic exercise per week.   If the vertigo recurs let us know and will send you for vestibular rehab or neurology evaluation  Vitamin D3 1000 IU daily

## 2014-06-14 NOTE — Progress Notes (Signed)
No chief complaint on file.   HPI:  DM:  -on metformin 1017m bid, asa, acei  -BS: does great at home, usually 88-110 fasting  -diabetic neuropathy and takes gabapentin for this  -trying to work on lifestyle, advised of health coach option  -foot exam: 11/2013  -denies: polyuria, polydipsia, vision changes, wounds of feet   HLD:  -increased pravastatin last visit - doing well on increased dose   HTN:  -on lisinopril, lasix  -denies: CP, SOB, swelling, palpitations, HA   GERD:  -on omeprazole  -denies: dysphagia  Shoulder tendonitis:  -seeing ortho for this   Chronic low back:  -low back pain - for many years -reports sees ortho and told OA and sciatic [facility] -denies: weakness, numbness, radiation, bowel or bladder incontinence   Shingles vaccine? Forgot to check  PPV: -dx by prior PCP for several years, has had several spells of this in the past and reports wants refill on meclizine -had spell in feb and last week, better today  ROS: See pertinent positives and negatives per HPI.  Past Medical History  Diagnosis Date  . Hyperlipidemia   . Hypertension   . GERD (gastroesophageal reflux disease)   . Depression   . Arthritis   . Low back pain   . Osteoarthritis, knee     Bilateral, s/p 3 total knee replacement surgerues on right, last 2005. Dr. BTonita Cong . Pellegrini-Steida syndrome     Myositis ossificans  . Obesity   . Dependent edema     Bilateral  . Diabetes mellitus     ORAL MEDICATION  . Shingles Jul 10 2012    LESIONS CLEARED BUT NOT TOLERATING ANY THING TIGHT AGAINST BODY--SHINGLES WERE AROSS BACK AND ABDOMEN  . Seasonal allergies   . Benign paroxysmal positional vertigo 06/25/2013  . Blood in stool 03/11/2012  . Bursitis of hip, right 05/07/2012    Past Surgical History  Procedure Laterality Date  . Tubal ligation  1978  . Abdominal hysterectomy  1984  . Tonsillectomy  1977  . Total knee arthroplasty Right     with multiple revisions,  2003, 2005  . Wisdom tooth extraction  [id number] . Total hip arthroplasty Right 10/10/2012    Procedure: R TOTAL HIP ARTHROPLASTY ANTERIOR APPROACH;  Surgeon: CMcarthur Rossetti MD;  Location: [facility];  Service: Orthopedics;  Laterality: Right;  Right Total Hip Arthroplasty, Anterior Approach (C-Arm)  . Colonoscopy    . Total knee arthroplasty Left 01/09/2013    Procedure: LEFT TOTAL KNEE ARTHROPLASTY;  Surgeon: CMcarthur Rossetti MD;  Location: [facility];  Service: Orthopedics;  Laterality: Left;  . Left shoulder steroid injection      Family History  Problem Relation Age of Onset  . Diabetes Mother   . Hypertension Mother   . Hypertension Father   . Cancer Father     Lung CA  . Coronary artery disease Father   . Colon cancer Neg Hx     History   Social History  . Marital Status: Married    Spouse Name: N/A    Number of Children: N/A  . Years of Education: 12th grade   Occupational History  .     Social History Main Topics  . Smoking status: Never Smoker   . Smokeless tobacco: Never Used  . Alcohol Use: No  . Drug Use: No  . Sexual Activity: None   Other Topics Concern  . None   Social History Narrative   Married, 3 daughters.  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   Nicotine: never   ETOH: no   Drugs: no    Current outpatient prescriptions:aspirin 81 MG tablet, Take 81 mg by mouth daily., Disp: , Rfl: ;  Blood Glucose Monitoring Suppl (ONETOUCH VERIO IQ SYSTEM) W/DEVICE KIT, by Does not apply route., Disp: , Rfl: ;  furosemide (LASIX) 40 MG tablet, Take 40 mg by mouth daily., Disp: , Rfl: ;  gabapentin (NEURONTIN) 100 MG capsule, TAKE 3 CAPSULES BY MOUTH AT BEDTIME, Disp: 90 capsule, Rfl: 0 glucose blood (ONETOUCH VERIO) test strip, Use as instructed once a day to check blood sugar, Disp: 100 each, Rfl: 3;  glucose blood test strip, Use as directed to check blood sugar once a day, Disp: 100 each, Rfl: 3;  lisinopril  (PRINIVIL,ZESTRIL) 20 MG tablet, TAKE 1 TABLET BY MOUTH EVERY DAY, Disp: 90 tablet, Rfl: 1;  meclizine (ANTIVERT) 25 MG tablet, Take 1 tablet (25 mg total) by mouth 2 (two) times daily as needed., Disp: 60 tablet, Rfl: 0 metFORMIN (GLUCOPHAGE) 500 MG tablet, TAKE 1 TABLET BY MOUTH TWICE DAILY WITH A MEAL, Disp: 60 tablet, Rfl: 3;  omeprazole (PRILOSEC) 10 MG capsule, Take 10 mg by mouth daily as needed., Disp: , Rfl: ;  pravastatin (PRAVACHOL) 40 MG tablet, Take 1.5 tablets daily., Disp: 135 tablet, Rfl: 1  EXAM:  Filed Vitals:   06/14/14 1024  BP: 122/80  Pulse: 71  Temp: 98.4 F (36.9 C)    Body mass index is 31.97 kg/(m^2).  GENERAL: vitals reviewed and listed above, alert, oriented, appears well hydrated and in no acute distress  HEENT: atraumatic, conjunttiva clear, no obvious abnormalities on inspection of external nose and ears  NECK: no carotid bruit, no obvious masses on inspection  LUNGS: clear to auscultation bilaterally, no wheezes, rales or rhonchi, good air movement  CV: HRRR, I/VI  SEM no peripheral edema  MS: moves all extremities without noticeable abnormality  PSYCH: pleasant and cooperative, no obvious depression or anxiety  NEURO: CN II-XII grossly intact, finger to nose normal, dix hallpike with mild vertigo to R but otherwise neg  ASSESSMENT AND PLAN:  Discussed the following assessment and plan:  Vertigo - Plan: meclizine (ANTIVERT) 25 MG tablet -advised neuro eval and/or vestibular rehab for likely PPV -she opted to do trial vestibular rehab if recurs, neuro eval if persists, worsens  Essential hypertension  Hyperlipemia  Gastroesophageal reflux disease, esophagitis presence not specified  Low back pain, unspecified back pain laterality, with sciatica presence unspecified  Type 2 diabetes mellitus without complication - Plan: Hemoglobin W4X, Basic metabolic panel  -NOT FASTING - no lipids today -Patient advised to return or notify a doctor  immediately if symptoms worsen or persist or new concerns arise.  Patient Instructions  BEFORE YOU LEAVE: -flu shot -fasting labs -schedule follow up in 3 months  We recommend the following healthy lifestyle measures: - eat a healthy diet consisting of lots of vegetables, fruits, beans, nuts, seeds, healthy meats such as white chicken and fish and whole grains.  - avoid fried foods, fast food, processed foods, sodas, red meet and other fattening foods.  - get a least 150 minutes of aerobic exercise per week.   If the vertigo recurs let us know and will send you for vestibular rehab or neurology evaluation  Vitamin D3 1000 IU daily       KIM, HANNAH R.

## 2014-06-14 NOTE — Progress Notes (Signed)
Pre visit review using our clinic review tool, if applicable. No additional management support is needed unless otherwise documented below in the visit note. 

## 2014-06-28 ENCOUNTER — Other Ambulatory Visit: Payer: Self-pay | Admitting: Family Medicine

## 2014-07-01 MED ORDER — METFORMIN HCL 1000 MG PO TABS: 1000.0000 mg | ORAL_TABLET | Freq: Two times a day (BID) | ORAL | Status: DC

## 2014-07-01 NOTE — Addendum Note (Signed)
Addended by: Agnes Lawrence on: 07/01/2014 01:34 PM   Modules accepted: Orders, Medications

## 2014-07-01 NOTE — Telephone Encounter (Signed)
She is supposed to be taking 1000mg  bid. Please ensure has refills of this. 90 days with 3 refills. Thanks.

## 2014-07-01 NOTE — Telephone Encounter (Signed)
Please add metFORMIN (GLUCOPHAGE) 500 MG tablet to the below re-fill request

## 2014-07-01 NOTE — Telephone Encounter (Signed)
Rx done and patient was informed.

## 2014-07-01 NOTE — Telephone Encounter (Signed)
Dr Kim-patient states she has been taking Metformin 1000mg  BID-not 500mg  BID.

## 2014-07-18 ENCOUNTER — Other Ambulatory Visit: Payer: Self-pay | Admitting: Family Medicine

## 2014-07-26 ENCOUNTER — Other Ambulatory Visit: Payer: Self-pay | Admitting: Family Medicine

## 2014-07-31 ENCOUNTER — Other Ambulatory Visit: Payer: Self-pay | Admitting: Family Medicine

## 2014-08-30 ENCOUNTER — Other Ambulatory Visit: Payer: Self-pay | Admitting: Family Medicine

## 2014-09-14 ENCOUNTER — Encounter: Payer: Self-pay | Admitting: Family Medicine

## 2014-09-14 ENCOUNTER — Ambulatory Visit (INDEPENDENT_AMBULATORY_CARE_PROVIDER_SITE_OTHER): Payer: 59 | Admitting: Family Medicine

## 2014-09-14 VITALS — BP 118/80 | HR 70 | Temp 97.8°F | Ht 66.0 in | Wt 197.5 lb

## 2014-09-14 DIAGNOSIS — E1140 Type 2 diabetes mellitus with diabetic neuropathy, unspecified: Secondary | ICD-10-CM

## 2014-09-14 DIAGNOSIS — R609 Edema, unspecified: Secondary | ICD-10-CM

## 2014-09-14 DIAGNOSIS — M1990 Unspecified osteoarthritis, unspecified site: Secondary | ICD-10-CM

## 2014-09-14 DIAGNOSIS — I10 Essential (primary) hypertension: Secondary | ICD-10-CM

## 2014-09-14 DIAGNOSIS — E1142 Type 2 diabetes mellitus with diabetic polyneuropathy: Secondary | ICD-10-CM

## 2014-09-14 DIAGNOSIS — E1149 Type 2 diabetes mellitus with other diabetic neurological complication: Secondary | ICD-10-CM

## 2014-09-14 DIAGNOSIS — G629 Polyneuropathy, unspecified: Secondary | ICD-10-CM

## 2014-09-14 DIAGNOSIS — E785 Hyperlipidemia, unspecified: Secondary | ICD-10-CM

## 2014-09-14 DIAGNOSIS — E1342 Other specified diabetes mellitus with diabetic polyneuropathy: Secondary | ICD-10-CM

## 2014-09-14 DIAGNOSIS — K219 Gastro-esophageal reflux disease without esophagitis: Secondary | ICD-10-CM

## 2014-09-14 LAB — BASIC METABOLIC PANEL
BUN: 10 mg/dL (ref 6–23)
CO2: 32 meq/L (ref 19–32)
CREATININE: 0.45 mg/dL (ref 0.40–1.20)
Calcium: 10.3 mg/dL (ref 8.4–10.5)
Chloride: 103 mEq/L (ref 96–112)
GFR: 180.11 mL/min (ref >60.00)
GLUCOSE: 126 mg/dL — AB (ref 70–99)
Potassium: 4.2 mEq/L (ref 3.5–5.1)
Sodium: 139 mEq/L (ref 135–145)

## 2014-09-14 LAB — LIPID PANEL
CHOL/HDL RATIO: 3
CHOLESTEROL: 149 mg/dL (ref 0–200)
HDL: 44.1 mg/dL (ref >39.00)
LDL Cholesterol: 83 mg/dL (ref 0–99)
NONHDL: 104.9
TRIGLYCERIDES: 108 mg/dL (ref 0.0–149.0)
VLDL: 21.6 mg/dL (ref 0.0–40.0)

## 2014-09-14 LAB — HEMOGLOBIN A1C: HEMOGLOBIN A1C: 6.7 % — AB (ref 4.6–6.5)

## 2014-09-14 MED ORDER — GABAPENTIN 300 MG PO CAPS: 300.0000 mg | ORAL_CAPSULE | Freq: Every day | ORAL | Status: DC

## 2014-09-14 NOTE — Patient Instructions (Signed)
BEFORE YOU LEAVE: -labs -follow up in 4 months  For the back: -exercises 4 days per week at least -tylenol 500-1000mg  up to 3 times per day  -topical capsaicin or menthol sports creams as needed -follow up with your orthopedic doctor if persists or worsens  We recommend the following healthy lifestyle measures: - eat a healthy diet consisting of lots of vegetables, fruits, beans, nuts, seeds, healthy meats such as white chicken and fish and whole grains.  - avoid fried foods, fast food, processed foods, sodas, red meet and other fattening foods.  - get a least 150 minutes of aerobic exercise per week.

## 2014-09-14 NOTE — Progress Notes (Signed)
Pre visit review using our clinic review tool, if applicable. No additional management support is needed unless otherwise documented below in the visit note. 

## 2014-09-14 NOTE — Progress Notes (Signed)
HPI:  DM:  -complications: neuropathy -meds: metformin 1047m bid, asa, acei  -BS: does great at home, usually 88-110 fasting  -diet and exercise: trying to work on lifestyle, advised of health coach option  -foot exam: 11/2013  -eye exam: may 2015 -denies: polyuria, polydipsia, vision changes, wounds of feet, hypoglycemia   HLD:  -meds: pravastatin -denies: leg cramps, cog issues on increased statin  HTN:  -meds: lisinopril, lasix  -denies: CP, SOB, swelling, palpitations, HA   GERD:  -meds:omeprazole  -denies: dysphagia   Shoulder tendonitis:  -seeing ortho for this   Chronic low back:  -low back pain - for many years -reports sees ortho and told OA and sciatic [facility] -denies: weakness, numbness, radiation, bowel or bladder incontinence  -reports does back exercises 4 days per week and uses tylenol which helps some  Shingles vaccine? Forgot to check  PPV: -dx by prior PCP for several years, has had several spells of this in the past  -uses meclizine prn  ROS: See pertinent positives and negatives per HPI.  Past Medical History  Diagnosis Date  . Hyperlipidemia   . Hypertension   . GERD (gastroesophageal reflux disease)   . Depression   . Arthritis   . Low back pain   . Osteoarthritis, knee     Bilateral, s/p 3 total knee replacement surgerues on right, last 2005. Dr. BTonita Cong . Pellegrini-Steida syndrome     Myositis ossificans  . Obesity   . Dependent edema     Bilateral  . Diabetes mellitus     ORAL MEDICATION  . Shingles Jul 10 2012    LESIONS CLEARED BUT NOT TOLERATING ANY THING TIGHT AGAINST BODY--SHINGLES WERE AROSS BACK AND ABDOMEN  . Seasonal allergies   . Benign paroxysmal positional vertigo 06/25/2013  . Blood in stool 03/11/2012  . Bursitis of hip, right 05/07/2012    Past Surgical History  Procedure Laterality Date  . Tubal ligation  1978  . Abdominal hysterectomy  1984  . Tonsillectomy  1977  . Total knee  arthroplasty Right     with multiple revisions, 2003, 2005  . Wisdom tooth extraction  [id number] . Total hip arthroplasty Right 10/10/2012    Procedure: R TOTAL HIP ARTHROPLASTY ANTERIOR APPROACH;  Surgeon: CMcarthur Rossetti MD;  Location: [facility];  Service: Orthopedics;  Laterality: Right;  Right Total Hip Arthroplasty, Anterior Approach (C-Arm)  . Colonoscopy    . Total knee arthroplasty Left 01/09/2013    Procedure: LEFT TOTAL KNEE ARTHROPLASTY;  Surgeon: CMcarthur Rossetti MD;  Location: [facility];  Service: Orthopedics;  Laterality: Left;  . Left shoulder steroid injection      Family History  Problem Relation Age of Onset  . Diabetes Mother   . Hypertension Mother   . Hypertension Father   . Cancer Father     Lung CA  . Coronary artery disease Father   . Colon cancer Neg Hx     History   Social History  . Marital Status: Married    Spouse Name: N/A    Number of Children: N/A  . Years of Education: 12th grade   Occupational History  .     Social History Main Topics  . Smoking status: Never Smoker   . Smokeless tobacco: Never Used  . Alcohol Use: No  . Drug Use: No  . Sexual Activity: None   Other Topics Concern  . None   Social History Narrative   Married, 3 daughters.   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  aspirin 81 MG tablet, Take 81 mg by mouth daily., Disp: , Rfl:  .  Blood Glucose Monitoring Suppl (ONETOUCH VERIO IQ SYSTEM) W/DEVICE KIT, by Does not apply route., Disp: , Rfl:  .  furosemide (LASIX) 40 MG tablet, Take 40 mg by mouth daily., Disp: , Rfl:  .  gabapentin (NEURONTIN) 300 MG capsule, Take 1 capsule (300 mg total) by mouth at bedtime., Disp: 90 capsule, Rfl: 3 .  glucose blood (ONETOUCH VERIO) test strip, Use as instructed once a day to check blood sugar, Disp: 100 each, Rfl: 3 .  glucose blood test strip, Use as  directed to check blood sugar once a day, Disp: 100 each, Rfl: 3 .  lisinopril (PRINIVIL,ZESTRIL) 20 MG tablet, TAKE 1 TABLET BY MOUTH EVERY DAY*PATIENT NEEDS APPOINTMENT FOR ADDITIONAL REFILLS*, Disp: 90 tablet, Rfl: 0 .  meclizine (ANTIVERT) 25 MG tablet, Take 1 tablet (25 mg total) by mouth 2 (two) times daily as needed., Disp: 60 tablet, Rfl: 0 .  metFORMIN (GLUCOPHAGE) 1000 MG tablet, Take 1 tablet (1,000 mg total) by mouth 2 (two) times daily with a meal., Disp: 180 tablet, Rfl: 3 .  omeprazole (PRILOSEC) 10 MG capsule, Take 10 mg by mouth daily as needed., Disp: , Rfl:  .  pravastatin (PRAVACHOL) 40 MG tablet, TAKE 1 1/2 TABLETS BY MOUTH DAILY, Disp: 135 tablet, Rfl: 0  EXAM:  Filed Vitals:   09/14/14 1007  BP: 118/80  Pulse: 70  Temp: 97.8 F (36.6 C)    Body mass index is 31.89 kg/(m^2).  GENERAL: vitals reviewed and listed above, alert, oriented, appears well hydrated and in no acute distress  HEENT: atraumatic, conjunttiva clear, no obvious abnormalities on inspection of external nose and ears  NECK: no obvious masses on inspection  LUNGS: clear to auscultation bilaterally, no wheezes, rales or rhonchi, good air movement  CV: HRRR, tr bilat peripheral edema  MS: moves all extremities without noticeable abnormality, minimal TTP R L low back, normal gait  PSYCH: pleasant and cooperative, no obvious depression or anxiety  ASSESSMENT AND PLAN:  Discussed the following assessment and plan:  Type II diabetes mellitus with neurological manifestations - Plan: Hemoglobin A1c  Hyperlipemia - Plan: Lipid Panel  Essential hypertension - Plan: Basic metabolic panel  Gastroesophageal reflux disease, esophagitis presence not specified  Osteoarthritis, unspecified osteoarthritis type, unspecified site -discussed tx  Options and admitted there are no great medications for long term treatment chronic OA related pain -advised exercise, tylenol and topical tx -discussed risks  of opiod pain medications and advised against these unless severe, limiting pain and advised pain clinic if this option needed -advised there may be inj options for some forms of degenerative back disease and to discuss this with her orthopedic doctor  DEPENDENT EDEMA, LEGS, BILATERAL  Diabetic peripheral neuropathy  -Patient advised to return or notify a doctor immediately if symptoms worsen or persist or new concerns arise.  Patient Instructions  BEFORE YOU LEAVE: -labs -follow up in 4 months  For the back: -exercises 4 days per week at least -tylenol 500-1057m up to 3 times per day  -topical capsaicin or menthol sports creams as needed -follow up with your orthopedic doctor if persists or worsens  We recommend the following healthy lifestyle measures: - eat a healthy diet consisting of lots of  vegetables, fruits, beans, nuts, seeds, healthy meats such as white chicken and fish and whole grains.  - avoid fried foods, fast food, processed foods, sodas, red meet and other fattening foods.  - get a least 150 minutes of aerobic exercise per week.       Colin Benton R.

## 2014-09-27 ENCOUNTER — Other Ambulatory Visit: Payer: Self-pay | Admitting: Family Medicine

## 2014-10-23 ENCOUNTER — Other Ambulatory Visit: Payer: Self-pay | Admitting: Family Medicine

## 2014-11-23 ENCOUNTER — Other Ambulatory Visit: Payer: Self-pay

## 2014-11-23 DIAGNOSIS — Z1231 Encounter for screening mammogram for malignant neoplasm of breast: Secondary | ICD-10-CM

## 2014-12-02 ENCOUNTER — Ambulatory Visit (INDEPENDENT_AMBULATORY_CARE_PROVIDER_SITE_OTHER): Payer: 59 | Admitting: Family Medicine

## 2014-12-02 ENCOUNTER — Encounter: Payer: Self-pay | Admitting: Family Medicine

## 2014-12-02 VITALS — BP 122/82 | HR 76 | Temp 98.5°F | Ht 66.0 in | Wt 202.7 lb

## 2014-12-02 DIAGNOSIS — R600 Localized edema: Secondary | ICD-10-CM | POA: Diagnosis not present

## 2014-12-02 MED ORDER — FUROSEMIDE 40 MG PO TABS: 20.0000 mg | ORAL_TABLET | Freq: Every day | ORAL | Status: DC

## 2014-12-02 NOTE — Patient Instructions (Signed)
Compression socks  Lasix 20 mg in the morning and again at lunch for 3 days, then 20 mg daily  Elevated legs above waist for 30 minutes twice daily  Follow up in 1 month

## 2014-12-02 NOTE — Progress Notes (Signed)
Pre visit review using our clinic review tool, if applicable. No additional management support is needed unless otherwise documented below in the visit note. 

## 2014-12-02 NOTE — Progress Notes (Signed)
HPI:  Renee Watts is a 64 yo F with a PMH sig for bilateral dependent edema here for an acute visit for:  Bilateral LE edema: -reports: seems to have worsened the last week - after eating a lot of unhealthy food - sweets -swelling in both ankles -not wearing compression socks or elevated legs -reports she actually had not been taking the lasix, but did restart a few days ago -denies: CP, SOB, DOE, cough -meds: takes lasix $RemoveBef'40mg'GcqBzdrQuf$  daily for the LE edema   ROS: See pertinent positives and negatives per HPI.  Past Medical History  Diagnosis Date  . Hyperlipidemia   . Hypertension   . GERD (gastroesophageal reflux disease)   . Depression   . Arthritis   . Low back pain   . Osteoarthritis, knee     Bilateral, s/p 3 total knee replacement surgerues on right, last 2005. Dr. Tonita Cong  . Pellegrini-Steida syndrome     Myositis ossificans  . Obesity   . Dependent edema     Bilateral  . Diabetes mellitus     ORAL MEDICATION  . Shingles Jul 10 2012    LESIONS CLEARED BUT NOT TOLERATING ANY THING TIGHT AGAINST BODY--SHINGLES WERE AROSS BACK AND ABDOMEN  . Seasonal allergies   . Benign paroxysmal positional vertigo 06/25/2013  . Blood in stool 03/11/2012  . Bursitis of hip, right 05/07/2012    Past Surgical History  Procedure Laterality Date  . Tubal ligation  1978  . Abdominal hysterectomy  1984  . Tonsillectomy  1977  . Total knee arthroplasty Right     with multiple revisions, 2003, 2005  . Wisdom tooth extraction  [id number]  . Total hip arthroplasty Right 10/10/2012    Procedure: R TOTAL HIP ARTHROPLASTY ANTERIOR APPROACH;  Surgeon: Mcarthur Rossetti, MD;  Location: [facility];  Service: Orthopedics;  Laterality: Right;  Right Total Hip Arthroplasty, Anterior Approach (C-Arm)  . Colonoscopy    . Total knee arthroplasty Left 01/09/2013    Procedure: LEFT TOTAL KNEE ARTHROPLASTY;  Surgeon: Mcarthur Rossetti, MD;  Location: [facility];  Service: Orthopedics;  Laterality: Left;   . Left shoulder steroid injection      Family History  Problem Relation Age of Onset  . Diabetes Mother   . Hypertension Mother   . Hypertension Father   . Cancer Father     Lung CA  . Coronary artery disease Father   . Colon cancer Neg Hx     History   Social History  . Marital Status: Married    Spouse Name: N/A  . Number of Children: N/A  . Years of Education: 12th grade   Occupational History  .     Social History Main Topics  . Smoking status: Never Smoker   . Smokeless tobacco: Never Used  . Alcohol Use: No  . Drug Use: No  . Sexual Activity: Not on file   Other Topics Concern  . None   Social History Narrative   Married, 3 daughters.   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   Nicotine: never   ETOH: no   Drugs: no     Current outpatient prescriptions:  .  aspirin 81 MG tablet, Take 81 mg by mouth daily., Disp: , Rfl:  .  Blood Glucose Monitoring Suppl (ONETOUCH VERIO IQ SYSTEM) W/DEVICE KIT, by Does not apply route., Disp: , Rfl:  .  furosemide (LASIX) 40 MG tablet, Take 0.5 tablets (20 mg total) by  mouth daily., Disp: 30 tablet, Rfl: 1 .  gabapentin (NEURONTIN) 300 MG capsule, Take 1 capsule (300 mg total) by mouth at bedtime., Disp: 90 capsule, Rfl: 3 .  glucose blood (ONETOUCH VERIO) test strip, Use as instructed once a day to check blood sugar, Disp: 100 each, Rfl: 3 .  glucose blood test strip, Use as directed to check blood sugar once a day, Disp: 100 each, Rfl: 3 .  lisinopril (PRINIVIL,ZESTRIL) 20 MG tablet, TAKE 1 TABLET BY MOUTH EVERY DAY, Disp: 90 tablet, Rfl: 0 .  meclizine (ANTIVERT) 25 MG tablet, Take 1 tablet (25 mg total) by mouth 2 (two) times daily as needed., Disp: 60 tablet, Rfl: 0 .  metFORMIN (GLUCOPHAGE) 1000 MG tablet, Take 1 tablet (1,000 mg total) by mouth 2 (two) times daily with a meal., Disp: 180 tablet, Rfl: 3 .  omeprazole (PRILOSEC) 10 MG capsule, Take 10 mg by mouth daily as needed.,  Disp: , Rfl:  .  pravastatin (PRAVACHOL) 40 MG tablet, TAKE 1 AND 1/2 TABLETS BY MOUTH DAILY, Disp: 135 tablet, Rfl: 0  EXAM:  Filed Vitals:   12/02/14 0854  BP: 122/82  Pulse: 76  Temp: 98.5 F (36.9 C)    Body mass index is 32.73 kg/(m^2).  GENERAL: vitals reviewed and listed above, alert, oriented, appears well hydrated and in no acute distress  HEENT: atraumatic, conjunttiva clear, no obvious abnormalities on inspection of external nose and ears  NECK: no obvious masses on inspection, no JVD  LUNGS: clear to auscultation bilaterally, no wheezes, rales or rhonchi, good air movement  CV: HRRR, bilat ankle and 1/3 lower leg 1 + edema  MS: moves all extremities without noticeable abnormality  PSYCH: pleasant and cooperative, no obvious depression or anxiety  ASSESSMENT AND PLAN:  Discussed the following assessment and plan:  Lower leg edema  -Patient advised to return or notify a doctor immediately if symptoms worsen or persist or new concerns arise.  Patient Instructions  Compression socks  Lasix 20 mg in the morning and again at lunch for 3 days, then 20 mg daily  Elevated legs above waist for 30 minutes twice daily  Follow up in 1 month     Alexio Sroka R.

## 2014-12-09 ENCOUNTER — Ambulatory Visit
Admission: RE | Admit: 2014-12-09 | Discharge: 2014-12-09 | Disposition: A | Discharge status: Home / Self Care | Payer: 59 | Source: Ambulatory Visit

## 2014-12-09 DIAGNOSIS — Z1231 Encounter for screening mammogram for malignant neoplasm of breast: Secondary | ICD-10-CM

## 2015-01-13 ENCOUNTER — Ambulatory Visit (INDEPENDENT_AMBULATORY_CARE_PROVIDER_SITE_OTHER): Payer: 59 | Admitting: Family Medicine

## 2015-01-13 ENCOUNTER — Encounter: Payer: Self-pay | Admitting: Family Medicine

## 2015-01-13 VITALS — BP 126/80 | HR 74 | Temp 98.4°F | Ht 66.0 in | Wt 202.7 lb

## 2015-01-13 DIAGNOSIS — I10 Essential (primary) hypertension: Secondary | ICD-10-CM

## 2015-01-13 DIAGNOSIS — E785 Hyperlipidemia, unspecified: Secondary | ICD-10-CM

## 2015-01-13 DIAGNOSIS — E1140 Type 2 diabetes mellitus with diabetic neuropathy, unspecified: Secondary | ICD-10-CM

## 2015-01-13 DIAGNOSIS — R42 Dizziness and giddiness: Secondary | ICD-10-CM | POA: Diagnosis not present

## 2015-01-13 DIAGNOSIS — E1149 Type 2 diabetes mellitus with other diabetic neurological complication: Secondary | ICD-10-CM

## 2015-01-13 LAB — BASIC METABOLIC PANEL
BUN: 11 mg/dL (ref 6–23)
CALCIUM: 9.7 mg/dL (ref 8.4–10.5)
CHLORIDE: 106 meq/L (ref 96–112)
CO2: 30 mEq/L (ref 19–32)
Creatinine, Ser: 0.4 mg/dL (ref 0.40–1.20)
GFR: 206.12 mL/min (ref >60.00)
Glucose, Bld: 107 mg/dL — ABNORMAL HIGH (ref 70–99)
Potassium: 3.5 mEq/L (ref 3.5–5.1)
SODIUM: 141 meq/L (ref 135–145)

## 2015-01-13 LAB — LIPID PANEL
CHOL/HDL RATIO: 3
CHOLESTEROL: 119 mg/dL (ref 0–200)
HDL: 38 mg/dL — ABNORMAL LOW (ref >39.00)
LDL Cholesterol: 61 mg/dL (ref 0–99)
NONHDL: 81
Triglycerides: 101 mg/dL (ref 0.0–149.0)
VLDL: 20.2 mg/dL (ref 0.0–40.0)

## 2015-01-13 LAB — HEMOGLOBIN A1C: HEMOGLOBIN A1C: 6.2 % (ref 4.6–6.5)

## 2015-01-13 MED ORDER — MECLIZINE HCL 25 MG PO TABS: 25.0000 mg | ORAL_TABLET | Freq: Three times a day (TID) | ORAL | Status: DC | PRN

## 2015-01-13 NOTE — Progress Notes (Signed)
Pre visit review using our clinic review tool, if applicable. No additional management support is needed unless otherwise documented below in the visit note. 

## 2015-01-13 NOTE — Progress Notes (Signed)
HPI:  DM:  -complications: neuropathy -meds: metformin 1051m bid, asa, acei  -BS: does great at home, usually 88-110 fasting  -diet and exercise: trying to work on lifestyle, advised of health coach option  -foot exam: 11/2013  -eye exam: may 2015 - but her doctor retired so she is rescheduling next month -denies: polyuria, polydipsia, vision changes, wounds of feet, hypoglycemia   HLD:  -meds: pravastatin -denies: leg cramps, cog issues on increased statin  HTN/LE edema:  -meds: lisinopril, lasix  -reports swelling increased today due to eating salty pork -denies: CP, SOB, palpitations, HA   GERD:  -meds:omeprazole  -denies: dysphagia   Shoulder tendonitis:  -seeing ortho for this   Chronic low back:  -low back pain - for many years -reports sees ortho and told OA and sciatic [facility] -denies: weakness, numbness, radiation, bowel or bladder incontinence  -reports does back exercises 4 days per week and uses tylenol which helps some  Shingles vaccine? Forgot to check  PPV: -dx by prior PCP for several years, has had several spells of this in the past  -having spell currently the last few weeks - with certain movements of head -reports she has never had vestibular rehab -uses meclizine prn   ROS: See pertinent positives and negatives per HPI.  Past Medical History  Diagnosis Date  . Hyperlipidemia   . Hypertension   . GERD (gastroesophageal reflux disease)   . Depression   . Arthritis   . Low back pain   . Osteoarthritis, knee     Bilateral, s/p 3 total knee replacement surgerues on right, last 2005. Dr. BTonita Cong . Pellegrini-Steida syndrome     Myositis ossificans  . Obesity   . Dependent edema     Bilateral  . Diabetes mellitus     ORAL MEDICATION  . Shingles Jul 10 2012    LESIONS CLEARED BUT NOT TOLERATING ANY THING TIGHT AGAINST BODY--SHINGLES WERE AROSS BACK AND ABDOMEN  . Seasonal allergies   . Benign paroxysmal positional  vertigo 06/25/2013  . Blood in stool 03/11/2012  . Bursitis of hip, right 05/07/2012    Past Surgical History  Procedure Laterality Date  . Tubal ligation  1978  . Abdominal hysterectomy  1984  . Tonsillectomy  1977  . Total knee arthroplasty Right     with multiple revisions, 2003, 2005  . Wisdom tooth extraction  [id number] . Total hip arthroplasty Right 10/10/2012    Procedure: R TOTAL HIP ARTHROPLASTY ANTERIOR APPROACH;  Surgeon: CMcarthur Rossetti MD;  Location: [facility];  Service: Orthopedics;  Laterality: Right;  Right Total Hip Arthroplasty, Anterior Approach (C-Arm)  . Colonoscopy    . Total knee arthroplasty Left 01/09/2013    Procedure: LEFT TOTAL KNEE ARTHROPLASTY;  Surgeon: CMcarthur Rossetti MD;  Location: [facility];  Service: Orthopedics;  Laterality: Left;  . Left shoulder steroid injection      Family History  Problem Relation Age of Onset  . Diabetes Mother   . Hypertension Mother   . Hypertension Father   . Cancer Father     Lung CA  . Coronary artery disease Father   . Colon cancer Neg Hx     History   Social History  . Marital Status: Married    Spouse Name: N/A  . Number of Children: N/A  . Years of Education: 12th grade   Occupational History  .     Social History Main Topics  . Smoking status: Never Smoker   .  Smokeless tobacco: Never Used  . Alcohol Use: No  . Drug Use: No  . Sexual Activity: Not on file   Other Topics Concern  . None   Social History Narrative   Married, 3 daughters.   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   Nicotine: never   ETOH: no   Drugs: no     Current outpatient prescriptions:  .  aspirin 81 MG tablet, Take 81 mg by mouth daily., Disp: , Rfl:  .  Blood Glucose Monitoring Suppl (ONETOUCH VERIO IQ SYSTEM) W/DEVICE KIT, by Does not apply route., Disp: , Rfl:  .  furosemide (LASIX) 40 MG tablet, Take 0.5 tablets (20 mg total) by mouth daily., Disp: 30 tablet, Rfl: 1 .   gabapentin (NEURONTIN) 300 MG capsule, Take 1 capsule (300 mg total) by mouth at bedtime., Disp: 90 capsule, Rfl: 3 .  glucose blood (ONETOUCH VERIO) test strip, Use as instructed once a day to check blood sugar, Disp: 100 each, Rfl: 3 .  glucose blood test strip, Use as directed to check blood sugar once a day, Disp: 100 each, Rfl: 3 .  lisinopril (PRINIVIL,ZESTRIL) 20 MG tablet, TAKE 1 TABLET BY MOUTH EVERY DAY, Disp: 90 tablet, Rfl: 0 .  metFORMIN (GLUCOPHAGE) 1000 MG tablet, Take 1 tablet (1,000 mg total) by mouth 2 (two) times daily with a meal., Disp: 180 tablet, Rfl: 3 .  omeprazole (PRILOSEC) 10 MG capsule, Take 10 mg by mouth daily as needed., Disp: , Rfl:  .  pravastatin (PRAVACHOL) 40 MG tablet, TAKE 1 AND 1/2 TABLETS BY MOUTH DAILY, Disp: 135 tablet, Rfl: 0 .  meclizine (ANTIVERT) 25 MG tablet, Take 1 tablet (25 mg total) by mouth 3 (three) times daily as needed for dizziness., Disp: 30 tablet, Rfl: 0  EXAM:  Filed Vitals:   01/13/15 1009  BP: 126/80  Pulse: 74  Temp: 98.4 F (36.9 C)    Body mass index is 32.73 kg/(m^2).  GENERAL: vitals reviewed and listed above, alert, oriented, appears well hydrated and in no acute distress  HEENT: atraumatic, conjunttiva clear, no obvious abnormalities on inspection of external nose and ears  NECK: no obvious masses on inspection  LUNGS: clear to auscultation bilaterally, no wheezes, rales or rhonchi, good air movement  CV: HRRR, bilat ankle peripheral edema  MS: moves all extremities without noticeable abnormality  PSYCH: pleasant and cooperative, no obvious depression or anxiety  NEURO: CN II-XII, finger to nose normal, finger to nose is normal, dix hallpike is very + to the R  ASSESSMENT AND PLAN:  Discussed the following assessment and plan:  Type II diabetes mellitus with neurological manifestations - Plan: Hemoglobin A1c  Hyperlipemia - Plan: Lipid panel  Essential hypertension - Plan: Basic metabolic  panel  Vertigo - Plan: PT vestibular rehab, meclizine (ANTIVERT) 25 MG tablet -we discussed possible serious and likely etiologies, workup and treatment, treatment risks and return precautions - highly suspect BPPV given hx and exam -after this discussion, [place] opted for vestibular rehab, meclizine, cautioned about driving, follow up if persists or other symptoms  -of course, we advised [place]  to return or notify a doctor immediately if symptoms worsen or persist or new concerns arise.   -Patient advised to return or notify a doctor immediately if symptoms worsen or persist or new concerns arise.  Patient Instructions  BEFORE YOU LEAVE: -labs -follow up appointment in 4 months  -We placed a referral for you as discussed to help  with the vertigo. It usually takes about 1-2 weeks to process and schedule this referral. If you have not heard from [place] regarding this appointment in 2 weeks please contact our office.  -please schedule your diabetic eye exam and have your eye doctor fax the report to [place]  We recommend the following healthy lifestyle measures: - eat a healthy diet consisting of lots of vegetables, fruits, beans, nuts, seeds, healthy meats such as white chicken and fish and whole grains.  - avoid fried foods, starches, sweets, fast food, processed foods, sodas, red meet and other fattening foods.  - get a least 150 minutes of aerobic exercise per week.        Colin Benton R.

## 2015-01-13 NOTE — Patient Instructions (Signed)
BEFORE YOU LEAVE: -labs -follow up appointment in 4 months  -We placed a referral for you as discussed to help with the vertigo. It usually takes about 1-2 weeks to process and schedule this referral. If you have not heard from [place] regarding this appointment in 2 weeks please contact our office.  -please schedule your diabetic eye exam and have your eye doctor fax the report to [place]  We recommend the following healthy lifestyle measures: - eat a healthy diet consisting of lots of vegetables, fruits, beans, nuts, seeds, healthy meats such as white chicken and fish and whole grains.  - avoid fried foods, starches, sweets, fast food, processed foods, sodas, red meet and other fattening foods.  - get a least 150 minutes of aerobic exercise per week.

## 2015-01-24 ENCOUNTER — Other Ambulatory Visit: Payer: Self-pay | Admitting: Family Medicine

## 2015-03-24 ENCOUNTER — Other Ambulatory Visit: Payer: Self-pay | Admitting: Family Medicine

## 2015-03-24 MED ORDER — PRAVASTATIN SODIUM 40 MG PO TABS: 60.0000 mg | ORAL_TABLET | Freq: Every day | ORAL | Status: DC

## 2015-04-08 ENCOUNTER — Other Ambulatory Visit: Payer: Self-pay | Admitting: Family Medicine

## 2015-05-10 ENCOUNTER — Ambulatory Visit (INDEPENDENT_AMBULATORY_CARE_PROVIDER_SITE_OTHER): Payer: PPO | Admitting: Family Medicine

## 2015-05-10 ENCOUNTER — Encounter: Payer: Self-pay | Admitting: Family Medicine

## 2015-05-10 VITALS — BP 130/82 | HR 74 | Temp 97.9°F | Ht 66.0 in | Wt 197.9 lb

## 2015-05-10 DIAGNOSIS — E785 Hyperlipidemia, unspecified: Secondary | ICD-10-CM | POA: Diagnosis not present

## 2015-05-10 DIAGNOSIS — I10 Essential (primary) hypertension: Secondary | ICD-10-CM

## 2015-05-10 DIAGNOSIS — R42 Dizziness and giddiness: Secondary | ICD-10-CM

## 2015-05-10 DIAGNOSIS — E1140 Type 2 diabetes mellitus with diabetic neuropathy, unspecified: Secondary | ICD-10-CM | POA: Diagnosis not present

## 2015-05-10 DIAGNOSIS — E1149 Type 2 diabetes mellitus with other diabetic neurological complication: Secondary | ICD-10-CM

## 2015-05-10 DIAGNOSIS — Z23 Encounter for immunization: Secondary | ICD-10-CM

## 2015-05-10 LAB — HEMOGLOBIN A1C: Hgb A1c MFr Bld: 6.6 % — ABNORMAL HIGH (ref 4.6–6.5)

## 2015-05-10 LAB — TSH: TSH: 1.2 u[IU]/mL (ref 0.35–4.50)

## 2015-05-10 NOTE — Progress Notes (Signed)
Pre visit review using our clinic review tool, if applicable. No additional management support is needed unless otherwise documented below in the visit note. 

## 2015-05-10 NOTE — Patient Instructions (Signed)
BEFORE YOU LEAVE: -flu vaccine -prevnar 13 -labs -schedule CPE with pap in 3-4 months, come fasting  -We placed a referral for you as discussed for the MRI of the brain. It usually takes about 1-2 weeks to process and schedule this referral. If you have not heard from [place] regarding this appointment in 2 weeks please contact our office.  -If MRI and labs ok will try formal vestibular rehab for the vertigo. If this does not work will have you see a neurologist if this persists.  -Please get you diabetic eye exam and have your eye doctor send the report to [place]. Thank you.   We recommend the following healthy lifestyle measures: - eat a healthy diet consisting of lots of vegetables, fruits, beans, nuts, seeds, healthy meats such as white chicken and fish  - avoid fried foods, fast food, processed foods, sodas, red meet and other fattening foods.  - get a least 150 minutes of aerobic exercise per week.

## 2015-05-10 NOTE — Addendum Note (Signed)
Addended by: Agnes Lawrence on: 05/10/2015 11:36 AM   Modules accepted: Orders

## 2015-05-10 NOTE — Progress Notes (Signed)
HPI:  DM:  -complications: neuropathy -meds: metformin $RemoveBeforeDE'1000mg'PjDWIizWwomCwlV$  bid, asa, acei  -BS: does great at home, usually 88-110 fasting  -diet and exercise: trying to work on lifestyle - not as good lately as mother passed in 02/2015 -foot exam: 11/2013  -eye exam: may 2015 - rescheduled due to mother's death, scheduled for 07-10-2023 -denies: polyuria, polydipsia, vision changes, wounds of feet, hypoglycemia   HLD:  -meds: pravastatin -denies: leg cramps, cog issues on increased statin  HTN/LE edema:  -meds: lisinopril, lasix  -denies: CP, SOB, palpitations, HA   GERD:  -meds:omeprazole  -denies: dysphagia   Shoulder tendonitis:  -seeing ortho for this   Chronic low back:  -low back pain - for many years -reports sees ortho and told OA and sciatic ([facility]) -denies: weakness, numbness, radiation, bowel or bladder incontinence  -reports does back exercises 4 days per week and uses tylenol which helps some  PPV: -dx by prior PCP for several years, has had several spells of this in the past - with more frequent spells this year -reports never contacted regarding VR, did exercises her brother had and resolved, then recurred 2 weeks ago -reports she has never had vestibular rehab or MRI -uses meclizine prn   ROS: See pertinent positives and negatives per HPI.  Past Medical History  Diagnosis Date  . Hyperlipidemia   . Hypertension   . GERD (gastroesophageal reflux disease)   . Depression   . Arthritis   . Low back pain   . Osteoarthritis, knee     Bilateral, s/p 3 total knee replacement surgerues on right, last 2005. Dr. Tonita Cong  . Pellegrini-Steida syndrome     Myositis ossificans  . Obesity   . Dependent edema     Bilateral  . Diabetes mellitus     ORAL MEDICATION  . Shingles Jul 10 2012    LESIONS CLEARED BUT NOT TOLERATING ANY THING TIGHT AGAINST BODY--SHINGLES WERE AROSS BACK AND ABDOMEN  . Seasonal allergies   . Benign paroxysmal positional  vertigo 06/25/2013  . Blood in stool 03/11/2012  . Bursitis of hip, right 05/07/2012  . Bilateral leg pain     Past Surgical History  Procedure Laterality Date  . Tubal ligation  1978  . Abdominal hysterectomy  1984  . Tonsillectomy  1977  . Total knee arthroplasty Right     with multiple revisions, 2003, 2005  . Wisdom tooth extraction  [id number]  . Total hip arthroplasty Right 10/10/2012    Procedure: R TOTAL HIP ARTHROPLASTY ANTERIOR APPROACH;  Surgeon: Mcarthur Rossetti, MD;  Location: [facility];  Service: Orthopedics;  Laterality: Right;  Right Total Hip Arthroplasty, Anterior Approach (C-Arm)  . Colonoscopy    . Total knee arthroplasty Left 01/09/2013    Procedure: LEFT TOTAL KNEE ARTHROPLASTY;  Surgeon: Mcarthur Rossetti, MD;  Location: [facility];  Service: Orthopedics;  Laterality: Left;  . Left shoulder steroid injection      Family History  Problem Relation Age of Onset  . Diabetes Mother   . Hypertension Mother   . Hypertension Father   . Cancer Father     Lung CA  . Coronary artery disease Father   . Colon cancer Neg Hx     Social History   Social History  . Marital Status: Married    Spouse Name: N/A  . Number of Children: N/A  . Years of Education: 12th grade   Occupational History  .     Social History Main Topics  . Smoking  status: Never Smoker   . Smokeless tobacco: Never Used  . Alcohol Use: No  . Drug Use: No  . Sexual Activity: Not Asked   Other Topics Concern  . None   Social History Narrative   Married, 3 daughters.   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   Nicotine: never   ETOH: no   Drugs: no     Current outpatient prescriptions:  .  aspirin 81 MG tablet, Take 81 mg by mouth daily., Disp: , Rfl:  .  Blood Glucose Monitoring Suppl (ONETOUCH VERIO IQ SYSTEM) W/DEVICE KIT, by Does not apply route., Disp: , Rfl:  .  furosemide (LASIX) 40 MG tablet, Take 0.5 tablets (20 mg total) by mouth daily.,  Disp: 30 tablet, Rfl: 1 .  gabapentin (NEURONTIN) 300 MG capsule, Take 1 capsule (300 mg total) by mouth at bedtime., Disp: 90 capsule, Rfl: 3 .  glucose blood (ONETOUCH VERIO) test strip, Use as instructed once a day to check blood sugar, Disp: 100 each, Rfl: 3 .  glucose blood test strip, Use as directed to check blood sugar once a day, Disp: 100 each, Rfl: 3 .  lisinopril (PRINIVIL,ZESTRIL) 20 MG tablet, TAKE 1 TABLET BY MOUTH EVERY DAY, Disp: 90 tablet, Rfl: 1 .  meclizine (ANTIVERT) 25 MG tablet, Take 1 tablet (25 mg total) by mouth 3 (three) times daily as needed for dizziness., Disp: 30 tablet, Rfl: 0 .  metFORMIN (GLUCOPHAGE) 1000 MG tablet, Take 1 tablet (1,000 mg total) by mouth 2 (two) times daily with a meal., Disp: 180 tablet, Rfl: 3 .  omeprazole (PRILOSEC) 10 MG capsule, Take 10 mg by mouth daily as needed., Disp: , Rfl:  .  pravastatin (PRAVACHOL) 40 MG tablet, Take 1.5 tablets (60 mg total) by mouth daily., Disp: 135 tablet, Rfl: 1 .  pravastatin (PRAVACHOL) 40 MG tablet, TAKE 1 TABLET BY MOUTH EVERY EVENING, Disp: 90 tablet, Rfl: 0  EXAM:  Filed Vitals:   05/10/15 1005  BP: 130/82  Pulse: 74  Temp: 97.9 F (36.6 C)    Body mass index is 31.96 kg/(m^2).  GENERAL: vitals reviewed and listed above, alert, oriented, appears well hydrated and in no acute distress  HEENT: atraumatic, conjunttiva clear, no obvious abnormalities on inspection of external nose and ears  NECK: no obvious masses on inspection  LUNGS: clear to auscultation bilaterally, no wheezes, rales or rhonchi, good air movement  CV: HRRR, bilat ankle peripheral edema  MS: moves all extremities without noticeable abnormality  PSYCH: pleasant and cooperative, no obvious depression or anxiety  NEURO: CN II-XII, finger to nose normal, finger to nose is normal, dix hallpike is very + to the R  ASSESSMENT AND PLAN:  Discussed the following assessment and plan:  .1. Type II diabetes mellitus with  neurological manifestations - Hemoglobin A1c -lifestyle recs, cont meds  2. Hyperlipemia -lifestyle recs, cont meds  3. Essential hypertension -stable  4. Vertigo - Methylmalonic Acid, Serum - TSH - MR Brain W Wo Contrast; Future -vestibular rehab if ok - neuro eval if persists  -Patient advised to return or notify a doctor immediately if symptoms worsen or persist or new concerns arise.  Patient Instructions  BEFORE YOU LEAVE: -flu vaccine -prevnar 13 -labs -schedule CPE with pap in 3-4 months, come fasting  -We placed a referral for you as discussed for the MRI of the brain. It usually takes about 1-2 weeks to process and schedule this referral. If you have  not heard from [place] regarding this appointment in 2 weeks please contact our office.  -If MRI and labs ok will try formal vestibular rehab for the vertigo. If this does not work will have you see a neurologist if this persists.  -Please get you diabetic eye exam and have your eye doctor send the report to [place]. Thank you.   We recommend the following healthy lifestyle measures: - eat a healthy diet consisting of lots of vegetables, fruits, beans, nuts, seeds, healthy meats such as white chicken and fish  - avoid fried foods, fast food, processed foods, sodas, red meet and other fattening foods.  - get a least 150 minutes of aerobic exercise per week.       Colin Benton R.

## 2015-05-12 LAB — METHYLMALONIC ACID, SERUM: METHYLMALONIC ACID, QUANT: 93 nmol/L (ref 87–318)

## 2015-05-17 ENCOUNTER — Ambulatory Visit
Admission: RE | Admit: 2015-05-17 | Discharge: 2015-05-17 | Disposition: A | Discharge status: Home / Self Care | Payer: PPO | Source: Ambulatory Visit | Attending: Family Medicine | Admitting: Family Medicine

## 2015-05-17 DIAGNOSIS — R42 Dizziness and giddiness: Secondary | ICD-10-CM

## 2015-05-17 MED ORDER — GADOBENATE DIMEGLUMINE 529 MG/ML IV SOLN
19.0000 mL | Freq: Once | INTRAVENOUS | Status: AC | PRN
  Administered 2015-05-17: 19 mL via INTRAVENOUS

## 2015-05-18 ENCOUNTER — Other Ambulatory Visit: Payer: Self-pay | Admitting: Family Medicine

## 2015-05-18 DIAGNOSIS — R42 Dizziness and giddiness: Secondary | ICD-10-CM

## 2015-05-19 ENCOUNTER — Encounter: Payer: PPO | Admitting: Family Medicine

## 2015-05-19 NOTE — Progress Notes (Signed)
HPI:  Acute visit for:  Sty:   Obesity: -eating ok, exercise?  ROS: See pertinent positives and negatives per HPI.  Past Medical History  Diagnosis Date  . Hyperlipidemia   . Hypertension   . GERD (gastroesophageal reflux disease)   . Depression   . Arthritis   . Low back pain   . Osteoarthritis, knee     Bilateral, s/p 3 total knee replacement surgerues on right, last 2005. Dr. Tonita Cong  . Pellegrini-Steida syndrome     Myositis ossificans  . Obesity   . Dependent edema     Bilateral  . Diabetes mellitus     ORAL MEDICATION  . Shingles Jul 10 2012    LESIONS CLEARED BUT NOT TOLERATING ANY THING TIGHT AGAINST BODY--SHINGLES WERE AROSS BACK AND ABDOMEN  . Seasonal allergies   . Benign paroxysmal positional vertigo 06/25/2013  . Blood in stool 03/11/2012  . Bursitis of hip, right 05/07/2012  . Bilateral leg pain     Past Surgical History  Procedure Laterality Date  . Tubal ligation  1978  . Abdominal hysterectomy  1984  . Tonsillectomy  1977  . Total knee arthroplasty Right     with multiple revisions, 2003, 2005  . Wisdom tooth extraction  [id number]  . Total hip arthroplasty Right 10/10/2012    Procedure: R TOTAL HIP ARTHROPLASTY ANTERIOR APPROACH;  Surgeon: Mcarthur Rossetti, MD;  Location: [facility];  Service: Orthopedics;  Laterality: Right;  Right Total Hip Arthroplasty, Anterior Approach (C-Arm)  . Colonoscopy    . Total knee arthroplasty Left 01/09/2013    Procedure: LEFT TOTAL KNEE ARTHROPLASTY;  Surgeon: Mcarthur Rossetti, MD;  Location: [facility];  Service: Orthopedics;  Laterality: Left;  . Left shoulder steroid injection      Family History  Problem Relation Age of Onset  . Diabetes Mother   . Hypertension Mother   . Hypertension Father   . Cancer Father     Lung CA  . Coronary artery disease Father   . Colon cancer Neg Hx     Social History   Social History  . Marital Status: Married    Spouse Name: N/A  . Number of Children: N/A  .  Years of Education: 12th grade   Occupational History  .     Social History Main Topics  . Smoking status: Never Smoker   . Smokeless tobacco: Never Used  . Alcohol Use: No  . Drug Use: No  . Sexual Activity: Not on file   Other Topics Concern  . Not on file   Social History Narrative   Married, 3 daughters.   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   Nicotine: never   ETOH: no   Drugs: no     Current outpatient prescriptions:  .  aspirin 81 MG tablet, Take 81 mg by mouth daily., Disp: , Rfl:  .  Blood Glucose Monitoring Suppl (ONETOUCH VERIO IQ SYSTEM) W/DEVICE KIT, by Does not apply route., Disp: , Rfl:  .  furosemide (LASIX) 40 MG tablet, Take 0.5 tablets (20 mg total) by mouth daily., Disp: 30 tablet, Rfl: 1 .  gabapentin (NEURONTIN) 300 MG capsule, Take 1 capsule (300 mg total) by mouth at bedtime., Disp: 90 capsule, Rfl: 3 .  glucose blood (ONETOUCH VERIO) test strip, Use as instructed once a day to check blood sugar, Disp: 100 each, Rfl: 3 .  glucose blood test strip, Use as directed to check blood sugar once  a day, Disp: 100 each, Rfl: 3 .  lisinopril (PRINIVIL,ZESTRIL) 20 MG tablet, TAKE 1 TABLET BY MOUTH EVERY DAY, Disp: 90 tablet, Rfl: 1 .  meclizine (ANTIVERT) 25 MG tablet, Take 1 tablet (25 mg total) by mouth 3 (three) times daily as needed for dizziness., Disp: 30 tablet, Rfl: 0 .  metFORMIN (GLUCOPHAGE) 1000 MG tablet, Take 1 tablet (1,000 mg total) by mouth 2 (two) times daily with a meal., Disp: 180 tablet, Rfl: 3 .  omeprazole (PRILOSEC) 10 MG capsule, Take 10 mg by mouth daily as needed., Disp: , Rfl:  .  pravastatin (PRAVACHOL) 40 MG tablet, Take 1.5 tablets (60 mg total) by mouth daily., Disp: 135 tablet, Rfl: 1 .  pravastatin (PRAVACHOL) 40 MG tablet, TAKE 1 TABLET BY MOUTH EVERY EVENING, Disp: 90 tablet, Rfl: 0  EXAM:  There were no vitals filed for this visit.  There is no weight on file to calculate BMI.  GENERAL:  vitals reviewed and listed above, alert, oriented, appears well hydrated and in no acute distress  HEENT: atraumatic, conjunttiva clear, no obvious abnormalities on inspection of external nose and ears  NECK: no obvious masses on inspection  LUNGS: clear to auscultation bilaterally, no wheezes, rales or rhonchi, good air movement  CV: HRRR, no peripheral edema  MS: moves all extremities without noticeable abnormality  PSYCH: pleasant and cooperative, no obvious depression or anxiety  ASSESSMENT AND PLAN:  Discussed the following assessment and plan:  No diagnosis found.  -Patient advised to return or notify a doctor immediately if symptoms worsen or persist or new concerns arise.  There are no Patient Instructions on file for this visit.   Colin Benton R.   This encounter was created in error - please disregard.

## 2015-06-16 LAB — HM DIABETES EYE EXAM

## 2015-06-28 ENCOUNTER — Encounter: Payer: Self-pay | Admitting: Family Medicine

## 2015-07-31 ENCOUNTER — Other Ambulatory Visit: Payer: Self-pay | Admitting: Family Medicine

## 2015-08-25 ENCOUNTER — Ambulatory Visit (INDEPENDENT_AMBULATORY_CARE_PROVIDER_SITE_OTHER): Payer: PPO | Admitting: Family Medicine

## 2015-08-25 ENCOUNTER — Encounter: Payer: Self-pay | Admitting: Family Medicine

## 2015-08-25 VITALS — BP 122/72 | HR 77 | Temp 98.3°F | Ht 66.0 in | Wt 197.8 lb

## 2015-08-25 DIAGNOSIS — J309 Allergic rhinitis, unspecified: Secondary | ICD-10-CM

## 2015-08-25 MED ORDER — FLUTICASONE PROPIONATE 50 MCG/ACT NA SUSP: 2.0000 | Freq: Every day | NASAL | Status: DC

## 2015-08-25 NOTE — Patient Instructions (Signed)
Keep physical appointment as scheduled  Continue zyrtec and add flonase 2 sprays each nostril daily for 1 month, then 1 spray each nostril daily. May have you see an allergist if symptoms continue.

## 2015-08-25 NOTE — Progress Notes (Addendum)
HPI:  Acute visit for:  Allergic Rhinitis: -chronic year round nasal congestion, PND, sneezing, itchy eyes and dark circles under eyes -denies SOB, fevers, pain, malaise, wheezing -using zyrtec  +dep screen on intake, she lost mother this year and is having normal bereavement. Has good support and good faith. No serious depression.  ROS: See pertinent positives and negatives per HPI.  Past Medical History  Diagnosis Date  . Hyperlipidemia   . Hypertension   . GERD (gastroesophageal reflux disease)   . Depression   . Arthritis   . Low back pain   . Osteoarthritis, knee     Bilateral, s/p 3 total knee replacement surgerues on right, last 2005. Dr. Tonita Cong  . Pellegrini-Steida syndrome     Myositis ossificans  . Obesity   . Dependent edema     Bilateral  . Diabetes mellitus     ORAL MEDICATION  . Shingles Jul 10 2012    LESIONS CLEARED BUT NOT TOLERATING ANY THING TIGHT AGAINST BODY--SHINGLES WERE AROSS BACK AND ABDOMEN  . Seasonal allergies   . Benign paroxysmal positional vertigo 06/25/2013  . Blood in stool 03/11/2012  . Bursitis of hip, right 05/07/2012  . Bilateral leg pain     Past Surgical History  Procedure Laterality Date  . Tubal ligation  1978  . Abdominal hysterectomy  1984  . Tonsillectomy  1977  . Total knee arthroplasty Right     with multiple revisions, 2003, 2005  . Wisdom tooth extraction  [id number]  . Total hip arthroplasty Right 10/10/2012    Procedure: R TOTAL HIP ARTHROPLASTY ANTERIOR APPROACH;  Surgeon: Mcarthur Rossetti, MD;  Location: [facility];  Service: Orthopedics;  Laterality: Right;  Right Total Hip Arthroplasty, Anterior Approach (C-Arm)  . Colonoscopy    . Total knee arthroplasty Left 01/09/2013    Procedure: LEFT TOTAL KNEE ARTHROPLASTY;  Surgeon: Mcarthur Rossetti, MD;  Location: [facility];  Service: Orthopedics;  Laterality: Left;  . Left shoulder steroid injection      Family History  Problem Relation Age of Onset  . Diabetes  Mother   . Hypertension Mother   . Hypertension Father   . Cancer Father     Lung CA  . Coronary artery disease Father   . Colon cancer Neg Hx     Social History   Social History  . Marital Status: Married    Spouse Name: N/A  . Number of Children: N/A  . Years of Education: 12th grade   Occupational History  .     Social History Main Topics  . Smoking status: Never Smoker   . Smokeless tobacco: Never Used  . Alcohol Use: No  . Drug Use: No  . Sexual Activity: Not Asked   Other Topics Concern  . None   Social History Narrative   Married, 3 daughters.   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   Nicotine: never   ETOH: no   Drugs: no     Current outpatient prescriptions:  .  aspirin 81 MG tablet, Take 81 mg by mouth daily., Disp: , Rfl:  .  Blood Glucose Monitoring Suppl (ONETOUCH VERIO IQ SYSTEM) W/DEVICE KIT, by Does not apply route., Disp: , Rfl:  .  cetirizine (ZYRTEC) 10 MG tablet, Take 10 mg by mouth at bedtime., Disp: , Rfl:  .  furosemide (LASIX) 40 MG tablet, Take 0.5 tablets (20 mg total) by mouth daily., Disp: 30 tablet, Rfl: 1 .  gabapentin (  NEURONTIN) 300 MG capsule, Take 1 capsule (300 mg total) by mouth at bedtime., Disp: 90 capsule, Rfl: 3 .  glucose blood test strip, Use as directed to check blood sugar once a day, Disp: 100 each, Rfl: 3 .  lisinopril (PRINIVIL,ZESTRIL) 20 MG tablet, TAKE 1 TABLET BY MOUTH EVERY DAY, Disp: 90 tablet, Rfl: 1 .  meclizine (ANTIVERT) 25 MG tablet, Take 1 tablet (25 mg total) by mouth 3 (three) times daily as needed for dizziness., Disp: 30 tablet, Rfl: 0 .  metFORMIN (GLUCOPHAGE) 1000 MG tablet, Take 1 tablet (1,000 mg total) by mouth 2 (two) times daily with a meal., Disp: 180 tablet, Rfl: 3 .  omeprazole (PRILOSEC) 10 MG capsule, Take 10 mg by mouth daily as needed., Disp: , Rfl:  .  ONETOUCH VERIO test strip, USE AS DIRECTED TO TEST BLOOD SUGAR ONCE DAILY, Disp: 100 each, Rfl: 2 .   pravastatin (PRAVACHOL) 40 MG tablet, Take 1.5 tablets (60 mg total) by mouth daily., Disp: 135 tablet, Rfl: 1 .  fluticasone (FLONASE) 50 MCG/ACT nasal spray, Place 2 sprays into both nostrils daily., Disp: 16 g, Rfl: 6  EXAM:  Filed Vitals:   08/25/15 0929  BP: 122/72  Pulse: 77  Temp: 98.3 F (36.8 C)    Body mass index is 31.94 kg/(m^2).  GENERAL: vitals reviewed and listed above, alert, oriented, appears well hydrated and in no acute distress  HEENT: atraumatic, conjunttiva clear, no obvious abnormalities on inspection of external nose and ears, normal appearance of ear canals and TMs, clear nasal congestion, boggy turbinates, mild post oropharyngeal erythema with PND, no tonsillar edema or exudate, no sinus TTP  NECK: no obvious masses on inspection  LUNGS: clear to auscultation bilaterally, no wheezes, rales or rhonchi, good air movement  CV: HRRR, no peripheral edema  MS: moves all extremities without noticeable abnormality  PSYCH: pleasant and cooperative, no obvious depression or anxiety  ASSESSMENT AND PLAN:  Discussed the following assessment and plan:  Allergic rhinitis, unspecified allergic rhinitis type -add ins, follow up at physical, allergist eval if persistent symptoms -Patient advised to return or notify a doctor immediately if symptoms worsen or persist or new concerns arise.  Patient Instructions  Keep physical appointment as scheduled  Continue zyrtec and add flonase 2 sprays each nostril daily for 1 month, then 1 spray each nostril daily. May have you see an allergist if symptoms continue.     Colin Benton R.

## 2015-09-07 ENCOUNTER — Other Ambulatory Visit: Payer: Self-pay | Admitting: Family Medicine

## 2015-09-09 ENCOUNTER — Encounter: Payer: Self-pay | Admitting: Family Medicine

## 2015-09-09 ENCOUNTER — Ambulatory Visit (INDEPENDENT_AMBULATORY_CARE_PROVIDER_SITE_OTHER): Payer: PPO | Admitting: Family Medicine

## 2015-09-09 VITALS — BP 112/72 | HR 75 | Temp 98.4°F | Ht 66.0 in | Wt 195.4 lb

## 2015-09-09 DIAGNOSIS — Z0000 Encounter for general adult medical examination without abnormal findings: Secondary | ICD-10-CM | POA: Diagnosis not present

## 2015-09-09 DIAGNOSIS — I10 Essential (primary) hypertension: Secondary | ICD-10-CM | POA: Diagnosis not present

## 2015-09-09 DIAGNOSIS — Z1159 Encounter for screening for other viral diseases: Secondary | ICD-10-CM | POA: Diagnosis not present

## 2015-09-09 DIAGNOSIS — E1149 Type 2 diabetes mellitus with other diabetic neurological complication: Secondary | ICD-10-CM

## 2015-09-09 DIAGNOSIS — E785 Hyperlipidemia, unspecified: Secondary | ICD-10-CM

## 2015-09-09 DIAGNOSIS — E2839 Other primary ovarian failure: Secondary | ICD-10-CM

## 2015-09-09 DIAGNOSIS — E669 Obesity, unspecified: Secondary | ICD-10-CM

## 2015-09-09 LAB — BASIC METABOLIC PANEL
BUN: 16 mg/dL (ref 6–23)
CALCIUM: 9.8 mg/dL (ref 8.4–10.5)
CO2: 30 mEq/L (ref 19–32)
CREATININE: 0.43 mg/dL (ref 0.40–1.20)
Chloride: 105 mEq/L (ref 96–112)
GFR: 189.23 mL/min (ref >60.00)
Glucose, Bld: 109 mg/dL — ABNORMAL HIGH (ref 70–99)
Potassium: 4.5 mEq/L (ref 3.5–5.1)
Sodium: 142 mEq/L (ref 135–145)

## 2015-09-09 LAB — HEMOGLOBIN A1C: HEMOGLOBIN A1C: 6.1 % (ref 4.6–6.5)

## 2015-09-09 NOTE — Progress Notes (Signed)
Medicare Annual Preventive Care Visit  (initial annual wellness or annual wellness exam)  Concerns and/or follow up today:  DM:  -complications: neuropathy, microvascular disease -meds: metformin 1033m bid, asa, acei  -diet and exercise: trying to work on lifestyle  -foot exam: 11/2013  -eye exam: done -denies: polyuria, polydipsia, vision changes, wounds of feet, hypoglycemia   HLD:  -meds: pravastatin -denies: leg cramps, cog issues on increased statin  HTN/LE edema:  -meds: lisinopril, lasix  -denies: CP, SOB, palpitations, HA   GERD:  -meds:omeprazole  -denies: dysphagia   Chronic low back/hx shoulder tendinitis:  -low back pain - for many years -reports sees ortho and told OA and sciatic ([facility]) -denies: weakness, numbness, radiation, bowel or bladder incontinence  -reports does back exercises 4 days per week and uses tylenol which helps some  PPV: -dx by prior PCP for several years, has had several spells of this in the past  -s/p labs and mri and vestibular rehab -reports nasal sprays for ETD resolved symptoms  ROS: negative for report of fevers, unintentional weight loss, vision changes, vision loss, hearing loss or change, chest pain, sob, hemoptysis, melena, hematochezia, hematuria, genital discharge or lesions, falls, bleeding or bruising, loc, thoughts of suicide or self harm, memory loss  1.) Patient-completed health risk assessment  - completed and reviewed, see scanned documentation  2.) Review of Medical History: -PMH, PSH, Family History and current specialty and care providers reviewed and updated and listed below  - see scanned in document in chart and below  Past Medical History  Diagnosis Date  . Hyperlipidemia   . Hypertension   . GERD (gastroesophageal reflux disease)   . Depression   . Arthritis   . Low back pain   . Osteoarthritis, knee     Bilateral, s/p 3 total knee replacement surgerues on right, last 2005. Dr.  BTonita Cong . Pellegrini-Steida syndrome     Myositis ossificans  . Obesity   . Dependent edema     Bilateral  . Diabetes mellitus     ORAL MEDICATION  . Shingles Jul 10 2012    LESIONS CLEARED BUT NOT TOLERATING ANY THING TIGHT AGAINST BODY--SHINGLES WERE AROSS BACK AND ABDOMEN  . Seasonal allergies   . Benign paroxysmal positional vertigo 06/25/2013  . Blood in stool 03/11/2012  . Bursitis of hip, right 05/07/2012  . Bilateral leg pain     Past Surgical History  Procedure Laterality Date  . Tubal ligation  1978  . Abdominal hysterectomy  1984  . Tonsillectomy  1977  . Total knee arthroplasty Right     with multiple revisions, 2003, 2005  . Wisdom tooth extraction  [id number] . Total hip arthroplasty Right 10/10/2012    Procedure: R TOTAL HIP ARTHROPLASTY ANTERIOR APPROACH;  Surgeon: CMcarthur Rossetti MD;  Location: [facility];  Service: Orthopedics;  Laterality: Right;  Right Total Hip Arthroplasty, Anterior Approach (C-Arm)  . Colonoscopy    . Total knee arthroplasty Left 01/09/2013    Procedure: LEFT TOTAL KNEE ARTHROPLASTY;  Surgeon: CMcarthur Rossetti MD;  Location: [facility];  Service: Orthopedics;  Laterality: Left;  . Left shoulder steroid injection      Social History   Social History  . Marital Status: Married    Spouse Name: N/A  . Number of Children: N/A  . Years of Education: 12th grade   Occupational History  .     Social History Main Topics  . Smoking status: Never Smoker   . Smokeless tobacco:  Never Used  . Alcohol Use: No  . Drug Use: No  . Sexual Activity: Not on file   Other Topics Concern  . Not on file   Social History Narrative   Married, 3 daughters.   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   Nicotine: never   ETOH: no   Drugs: no    Family History  Problem Relation Age of Onset  . Diabetes Mother   . Hypertension Mother   . Hypertension Father   . Cancer Father     Lung CA  . Coronary artery  disease Father   . Colon cancer Neg Hx     Current Outpatient Prescriptions on File Prior to Visit  Medication Sig Dispense Refill  . aspirin 81 MG tablet Take 81 mg by mouth daily.    . Blood Glucose Monitoring Suppl (ONETOUCH VERIO IQ SYSTEM) W/DEVICE KIT by Does not apply route.    . cetirizine (ZYRTEC) 10 MG tablet Take 10 mg by mouth at bedtime.    . fluticasone (FLONASE) 50 MCG/ACT nasal spray Place 2 sprays into both nostrils daily. 16 g 6  . furosemide (LASIX) 40 MG tablet Take 0.5 tablets (20 mg total) by mouth daily. 30 tablet 1  . gabapentin (NEURONTIN) 300 MG capsule Take 1 capsule (300 mg total) by mouth at bedtime. 90 capsule 3  . glucose blood test strip Use as directed to check blood sugar once a day 100 each 3  . lisinopril (PRINIVIL,ZESTRIL) 20 MG tablet TAKE 1 TABLET BY MOUTH EVERY DAY 90 tablet 1  . meclizine (ANTIVERT) 25 MG tablet Take 1 tablet (25 mg total) by mouth 3 (three) times daily as needed for dizziness. 30 tablet 0  . metFORMIN (GLUCOPHAGE) 1000 MG tablet Take 1 tablet (1,000 mg total) by mouth 2 (two) times daily with a meal. 180 tablet 3  . omeprazole (PRILOSEC) 10 MG capsule Take 10 mg by mouth daily as needed.    Glory Rosebush VERIO test strip USE AS DIRECTED TO TEST BLOOD SUGAR ONCE DAILY 100 each 2  . pravastatin (PRAVACHOL) 40 MG tablet Take 1.5 tablets (60 mg total) by mouth daily. 135 tablet 1   No current facility-administered medications on file prior to visit.     3.) Review of functional ability and level of safety:  Any difficulty hearing?  NO  History of falling?  NO  Any trouble with IADLs - using a phone, using transportation, grocery shopping, preparing meals, doing housework, doing laundry, taking medications and managing money? NO  Advance Directives? NO -assistance offered  See summary of recommendations in Patient Instructions below.  4.) Physical Exam Filed Vitals:   09/09/15 0834  BP: 112/72  Pulse: 75  Temp: 98.4 F  (36.9 C)   Estimated body mass index is 31.55 kg/(m^2) as calculated from the following:   Height as of this encounter: _0  (1.676 m).   Weight as of this encounter: 195 lb 6.4 oz (88.633 kg).  EKG (optional): deferred  General: alert, appear well hydrated and in no acute distress  HEENT: visual acuity grossly intact  CV: HRRR  Lungs: CTA bilaterally  Psych: pleasant and cooperative, no obvious depression or anxiety  Cognitive function grossly intact  See patient instructions for recommendations.  Education and counseling regarding the above review of health provided with a plan for the following: -see scanned patient completed form for further details -fall prevention strategies discussed  -healthy lifestyle discussed -importance and  resources for completing advanced directives discussed -see patient instructions below for any other recommendations provided  4)The following written screening schedule of preventive measures were reviewed with assessment and plan made per below, orders and patient instructions:       Alcohol screening done     Obesity Screening and counseling done     STI screening (Hep C if born [birth year]) offered and per pt wishes     Tobacco Screening done done       Pneumococcal (PPSV23 -one dose after 64, one before if risk factors), influenza yearly and hepatitis B vaccines (if high risk - end stage renal disease, IV drugs, homosexual men, live in home for mentally retarded, hemophilia receiving factors) ASSESSMENT/PLAN: done      Screening mammograph (yearly if >40) ASSESSMENT/PLAN: utd       Screening Pap smear/pelvic exam (q2 years) ASSESSMENT/PLAN: n/a, declined      Prostate cancer screening ASSESSMENT/PLAN: n/a, declined      Colorectal cancer screening (FOBT yearly or flex sig q4y or colonoscopy q10y or barium enema q4y) ASSESSMENT/PLAN: utd or ordered      Diabetes outpatient self-management training services ASSESSMENT/PLAN: utd or  done      Bone mass measurements(covered q2y if indicated - estrogen def, osteoporosis, hyperparathyroid, vertebral abnormalities, osteoporosis or steroids) ASSESSMENT/PLAN: utd or discussed and ordered per pt wishes      Screening for glaucoma(q1y if high risk - diabetes, FH, AA and > 50 or hispanic and > 65) ASSESSMENT/PLAN: utd or advised      Medical nutritional therapy for individuals with diabetes or renal disease ASSESSMENT/PLAN: see orders      Cardiovascular screening blood tests (lipids q5y) ASSESSMENT/PLAN: see orders and labs      Diabetes screening tests ASSESSMENT/PLAN: see orders and labs   7.) Summary: -risk factors and conditions per above assessment were discussed and treatment, recommendations and referrals were offered per documentation above and orders and patient instructions.  Medicare annual wellness visit, initial  Type II diabetes mellitus with neurological manifestations ([place]) - Plan: Hemoglobin A1c  Hyperlipemia  Essential hypertension - Plan: Basic metabolic panel  Obesity  Estrogen deficiency - Plan: DG Bone Density  Need for hepatitis C screening test - Plan: Hep C Antibody  Patient Instructions  BEFORE YOU LEAVE: -would you like to speak with our nurse about setting up advanced directives? -labs -schedule follow up in 4 months  -We have ordered labs or studies at this visit. It can take up to 1-2 weeks for results and processing. We will contact you with instructions IF your results are abnormal. Normal results will be released to your [facility]. If you have not heard from [place] or can not find your results in [facility] in 2 weeks please contact our office.  -We placed a referral for you as discussed for a bone density test. It usually takes about 1-2 weeks to process and schedule this referral. If you have not heard from [place] regarding this appointment in 2 weeks please contact our office.  We recommend the following healthy lifestyle measures: -  eat a healthy whole foods diet consisting of regular small meals composed of vegetables, fruits, beans, nuts, seeds, healthy meats such as white chicken and fish and whole grains.  - avoid sweets, white starchy foods, fried foods, fast food, processed foods, sodas, red meet and other fattening foods.  - get a least 150-300 minutes of aerobic exercise per week.   Please ensure you are taking [phone] IU daily of  Vitamin D3  Please check on the cost of the shingles vaccine and let us know if you decide to do this

## 2015-09-09 NOTE — Progress Notes (Signed)
Pre visit review using our clinic review tool, if applicable. No additional management support is needed unless otherwise documented below in the visit note. 

## 2015-09-09 NOTE — Patient Instructions (Signed)
BEFORE YOU LEAVE: -would you like to speak with our nurse about setting up advanced directives? -labs -schedule follow up in 4 months  -We have ordered labs or studies at this visit. It can take up to 1-2 weeks for results and processing. We will contact you with instructions IF your results are abnormal. Normal results will be released to your [facility]. If you have not heard from [place] or can not find your results in [facility] in 2 weeks please contact our office.  -We placed a referral for you as discussed for a bone density test. It usually takes about 1-2 weeks to process and schedule this referral. If you have not heard from [place] regarding this appointment in 2 weeks please contact our office.  We recommend the following healthy lifestyle measures: - eat a healthy whole foods diet consisting of regular small meals composed of vegetables, fruits, beans, nuts, seeds, healthy meats such as white chicken and fish and whole grains.  - avoid sweets, white starchy foods, fried foods, fast food, processed foods, sodas, red meet and other fattening foods.  - get a least 150-300 minutes of aerobic exercise per week.   Please ensure you are taking [phone] IU daily of Vitamin D3  Please check on the cost of the shingles vaccine and let us know if you decide to do this

## 2015-09-10 LAB — HEPATITIS C ANTIBODY: HCV Ab: NEGATIVE

## 2015-09-18 ENCOUNTER — Other Ambulatory Visit: Payer: Self-pay | Admitting: Family Medicine

## 2015-10-31 ENCOUNTER — Other Ambulatory Visit: Payer: Self-pay

## 2015-10-31 DIAGNOSIS — Z1231 Encounter for screening mammogram for malignant neoplasm of breast: Secondary | ICD-10-CM

## 2015-12-09 ENCOUNTER — Other Ambulatory Visit: Payer: PPO

## 2015-12-09 ENCOUNTER — Ambulatory Visit: Payer: PPO

## 2015-12-13 ENCOUNTER — Ambulatory Visit
Admission: RE | Admit: 2015-12-13 | Discharge: 2015-12-13 | Disposition: A | Discharge status: Home / Self Care | Payer: PPO | Source: Ambulatory Visit

## 2015-12-13 ENCOUNTER — Ambulatory Visit
Admission: RE | Admit: 2015-12-13 | Discharge: 2015-12-13 | Disposition: A | Discharge status: Home / Self Care | Payer: PPO | Source: Ambulatory Visit | Attending: Family Medicine | Admitting: Family Medicine

## 2015-12-13 DIAGNOSIS — Z1231 Encounter for screening mammogram for malignant neoplasm of breast: Secondary | ICD-10-CM

## 2015-12-13 DIAGNOSIS — Z780 Asymptomatic menopausal state: Secondary | ICD-10-CM | POA: Diagnosis not present

## 2015-12-13 DIAGNOSIS — M85852 Other specified disorders of bone density and structure, left thigh: Secondary | ICD-10-CM | POA: Diagnosis not present

## 2015-12-13 DIAGNOSIS — E2839 Other primary ovarian failure: Secondary | ICD-10-CM

## 2015-12-14 ENCOUNTER — Encounter: Payer: Self-pay | Admitting: *Deleted

## 2016-01-06 ENCOUNTER — Encounter: Payer: Self-pay | Admitting: Family Medicine

## 2016-01-06 ENCOUNTER — Ambulatory Visit (INDEPENDENT_AMBULATORY_CARE_PROVIDER_SITE_OTHER): Payer: PPO | Admitting: Family Medicine

## 2016-01-06 VITALS — BP 120/74 | HR 77 | Temp 98.2°F | Ht 66.0 in | Wt 189.7 lb

## 2016-01-06 DIAGNOSIS — E1149 Type 2 diabetes mellitus with other diabetic neurological complication: Secondary | ICD-10-CM

## 2016-01-06 DIAGNOSIS — M545 Low back pain, unspecified: Secondary | ICD-10-CM | POA: Insufficient documentation

## 2016-01-06 DIAGNOSIS — I10 Essential (primary) hypertension: Secondary | ICD-10-CM

## 2016-01-06 DIAGNOSIS — E785 Hyperlipidemia, unspecified: Secondary | ICD-10-CM | POA: Diagnosis not present

## 2016-01-06 DIAGNOSIS — E669 Obesity, unspecified: Secondary | ICD-10-CM | POA: Diagnosis not present

## 2016-01-06 DIAGNOSIS — I788 Other diseases of capillaries: Secondary | ICD-10-CM

## 2016-01-06 LAB — POCT GLYCOSYLATED HEMOGLOBIN (HGB A1C): Hemoglobin A1C: 6.4

## 2016-01-06 NOTE — Addendum Note (Signed)
Addended by: Agnes Lawrence on: 01/06/2016 10:28 AM   Modules accepted: Orders

## 2016-01-06 NOTE — Patient Instructions (Addendum)
Before you leave: -Point of care hemoglobin A1c -Schedule fasting lab visit in the next 1 week; drink plenty of water that do not eat for 8 hours prior to the appointment -Follow up visit in about 3-4 months  Congratulations on the new diet changes and regular exercise! Please keep up the great work! We recommend the following healthy lifestyle measures: - eat a healthy whole foods diet consisting of regular small meals composed of vegetables, fruits, beans, nuts, seeds, healthy meats such as white chicken and fish and whole grains.  - avoid sweets, white starchy foods, fried foods, fast food, processed foods, sodas, red meet and other fattening foods.  - get a least 150-300 minutes of aerobic exercise per week.   We have ordered labs or studies at this visit. It can take up to 1-2 weeks for results and processing. IF results require follow up or explanation, we will call you with instructions. Clinically stable results will be released to your [facility]. If you have not heard from [place] or cannot find your results in [facility] in 2 weeks please contact our office at [phone].  If you are not yet signed up for [facility], please consider signing up.

## 2016-01-06 NOTE — Progress Notes (Signed)
HPI:  Follow up:  DM:  -complications: neuropathy, microvascular disease -meds: metformin 1000mg  bid, asa, acei  -diet and exercise: trying to work on lifestyle and has made significant changes in diet and is exercising-she is happy to report that she has gone down 4 pant sizes Sizes in 3 months -foot exam: 12/2014; will do today -eye exam: done -denies: polyuria, polydipsia, vision changes, wounds of feet, hypoglycemia   HLD/Obesity:  -meds: pravastatin -denies: leg cramps, cog issues on increased statin  HTN/LE edema:  -meds: lisinopril, lasix  -denies: CP, SOB, palpitations, HA   GERD:  -meds:omeprazole  -denies: dysphagia   Skin Rash: -Last few months -Little red spot on both legs, no itching or pain, seems to be improving  ROS: See pertinent positives and negatives per HPI.  Past Medical History  Diagnosis Date  . Obesity, diabetes, and hypertension syndrome (HCC)   . Hyperlipidemia   . GERD (gastroesophageal reflux disease)   . Depression   . Low back pain     sciatica; sees orthopedic specialist ([facility])for this and shoulder tendinitis  . Osteoarthritis, knee     Bilateral, s/p 3 total knee replacement surgerues on right, last 2005. Dr. 2006  . Pellegrini-Steida syndrome     Myositis ossificans  . Dependent edema     Bilateral  . Shingles Jul 10 2012    LESIONS CLEARED BUT NOT TOLERATING ANY THING TIGHT AGAINST BODY--SHINGLES WERE AROSS BACK AND ABDOMEN  . Seasonal allergies   . Benign paroxysmal positional vertigo 06/25/2013    dx with prior PCP, s/p labs, mri and vestibular rehab; nasal spray for etd seemed to help  . Blood in stool 03/11/2012    Past Surgical History  Procedure Laterality Date  . Tubal ligation  1978  . Abdominal hysterectomy  1984  . Tonsillectomy  1977  . Total knee arthroplasty Right     with multiple revisions, 2003, 2005  . Wisdom tooth extraction  09-15-2004  . Total hip arthroplasty Right 10/10/2012   Procedure: R TOTAL HIP ARTHROPLASTY ANTERIOR APPROACH;  Surgeon: 10/12/2012, MD;  Location: [facility];  Service: Orthopedics;  Laterality: Right;  Right Total Hip Arthroplasty, Anterior Approach (C-Arm)  . Colonoscopy    . Total knee arthroplasty Left 01/09/2013    Procedure: LEFT TOTAL KNEE ARTHROPLASTY;  Surgeon: 01/11/2013, MD;  Location: [facility];  Service: Orthopedics;  Laterality: Left;  . Left shoulder steroid injection      Family History  Problem Relation Age of Onset  . Diabetes Mother   . Hypertension Mother   . Hypertension Father   . Cancer Father     Lung CA  . Coronary artery disease Father   . Colon cancer Neg Hx     Social History   Social History  . Marital Status: Married    Spouse Name: N/A  . Number of Children: N/A  . Years of Education: 12th grade   Occupational History  .     Social History Main Topics  . Smoking status: Never Smoker   . Smokeless tobacco: Never Used  . Alcohol Use: No  . Drug Use: No  . Sexual Activity: Not Asked   Other Topics Concern  . None   Social History Narrative   Married, 3 daughters.   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  aspirin 81 MG tablet, Take 81 mg by mouth daily., Disp: , Rfl:  .  Blood Glucose Monitoring Suppl (ONETOUCH VERIO IQ SYSTEM) W/DEVICE KIT, by Does not apply route., Disp: , Rfl:  .  cetirizine (ZYRTEC) 10 MG tablet, Take 10 mg by mouth at bedtime., Disp: , Rfl:  .  fluticasone (FLONASE) 50 MCG/ACT nasal spray, Place 2 sprays into both nostrils daily., Disp: 16 g, Rfl: 6 .  furosemide (LASIX) 40 MG tablet, Take 0.5 tablets (20 mg total) by mouth daily., Disp: 30 tablet, Rfl: 1 .  gabapentin (NEURONTIN) 300 MG capsule, Take 1 capsule (300 mg total) by mouth at bedtime., Disp: 90 capsule, Rfl: 3 .  glucose blood test strip, Use as directed to check blood sugar  once a day, Disp: 100 each, Rfl: 3 .  lisinopril (PRINIVIL,ZESTRIL) 20 MG tablet, TAKE 1 TABLET BY MOUTH EVERY DAY, Disp: 90 tablet, Rfl: 1 .  metFORMIN (GLUCOPHAGE) 1000 MG tablet, TAKE 1 TABLET BY MOUTH TWICE DAILY WITH A MEAL, Disp: 180 tablet, Rfl: 1 .  omeprazole (PRILOSEC) 10 MG capsule, Take 10 mg by mouth daily as needed., Disp: , Rfl:  .  ONETOUCH VERIO test strip, USE AS DIRECTED TO TEST BLOOD SUGAR ONCE DAILY, Disp: 100 each, Rfl: 2 .  pravastatin (PRAVACHOL) 40 MG tablet, Take 1.5 tablets (60 mg total) by mouth daily., Disp: 135 tablet, Rfl: 1  EXAM:  Filed Vitals:   01/06/16 0916  BP: 120/74  Pulse: 77  Temp: 98.2 F (36.8 C)    Body mass index is 30.63 kg/(m^2).  GENERAL: vitals reviewed and listed above, alert, oriented, appears well hydrated and in no acute distress  HEENT: atraumatic, conjunttiva clear, no obvious abnormalities on inspection of external nose and ears  NECK: no obvious masses on inspection  LUNGS: clear to auscultation bilaterally, no wheezes, rales or rhonchi, good air movement  CV: HRRR, tr peripheral edema in lower extremities bilaterally  MS: moves all extremities without noticeable abnormality  SKIN: Surgical scars on the knees, cayenne pepper spots bilateral lower extremities - mild  PSYCH: pleasant and cooperative, no obvious depression or anxiety  FOOT EXAM: done  ASSESSMENT AND PLAN:  Discussed the following assessment and plan:  Type II diabetes mellitus with neurological manifestations (HCC) Obesity -Labs -Congratulated on lifestyle changes encouraged lifelong adherence to these changes -Continue current treatments -Physical exam done, foot care advised  Capillaritis -we discussed possible serious and likely etiologies, workup and treatment, treatment risks and return precautions, likely benign, opted to monitor -of course, we advised Letty  to return or notify a doctor immediately if symptoms worsen or persist or new  concerns arise.  Hyperlipemia -Continue current treatment along with lifestyle changes  Essential hypertension -Continue current treatment along with lifestyle changes   -Patient advised to return or notify a doctor immediately if symptoms worsen or persist or new concerns arise.  Patient Instructions  Before you leave: -Point of care hemoglobin A1c -Schedule fasting lab visit in the next 1 week; drink plenty of water that do not eat for 8 hours prior to the appointment -Follow up visit in about 3-4 months  Congratulations on the new diet changes and regular exercise! Please keep up the great work! We recommend the following healthy lifestyle measures: - eat a healthy whole foods diet consisting of regular small meals composed of vegetables, fruits, beans, nuts, seeds, healthy meats such as white chicken and fish and whole grains.  - avoid sweets, white starchy foods, fried foods, fast  food, processed foods, sodas, red meet and other fattening foods.  - get a least 150-300 minutes of aerobic exercise per week.   We have ordered labs or studies at this visit. It can take up to 1-2 weeks for results and processing. IF results require follow up or explanation, we will call you with instructions. Clinically stable results will be released to your [facility]. If you have not heard from [place] or cannot find your results in [facility] in 2 weeks please contact our office at [phone].  If you are not yet signed up for [facility], please consider signing up.            Colin Benton R.

## 2016-01-06 NOTE — Progress Notes (Signed)
Pre visit review using our clinic review tool, if applicable. No additional management support is needed unless otherwise documented below in the visit note. 

## 2016-01-12 ENCOUNTER — Other Ambulatory Visit (INDEPENDENT_AMBULATORY_CARE_PROVIDER_SITE_OTHER): Payer: PPO

## 2016-01-12 DIAGNOSIS — E1149 Type 2 diabetes mellitus with other diabetic neurological complication: Secondary | ICD-10-CM | POA: Diagnosis not present

## 2016-01-12 DIAGNOSIS — I10 Essential (primary) hypertension: Secondary | ICD-10-CM | POA: Diagnosis not present

## 2016-01-12 DIAGNOSIS — E785 Hyperlipidemia, unspecified: Secondary | ICD-10-CM

## 2016-01-12 LAB — BASIC METABOLIC PANEL
BUN: 12 mg/dL (ref 6–23)
CO2: 30 mEq/L (ref 19–32)
CREATININE: 0.47 mg/dL (ref 0.40–1.20)
Calcium: 9.5 mg/dL (ref 8.4–10.5)
Chloride: 105 mEq/L (ref 96–112)
GFR: 170.59 mL/min (ref >60.00)
Glucose, Bld: 114 mg/dL — ABNORMAL HIGH (ref 70–99)
Potassium: 4 mEq/L (ref 3.5–5.1)
Sodium: 140 mEq/L (ref 135–145)

## 2016-01-12 LAB — CBC WITH DIFFERENTIAL/PLATELET
Basophils Absolute: 0 10*3/uL (ref 0.0–0.1)
Basophils Relative: 0.4 % (ref 0.0–3.0)
EOS ABS: 0.5 10*3/uL (ref 0.0–0.7)
Eosinophils Relative: 7.4 % — ABNORMAL HIGH (ref 0.0–5.0)
HCT: 34.2 % — ABNORMAL LOW (ref 36.0–46.0)
HEMOGLOBIN: 11.6 g/dL — AB (ref 12.0–15.0)
Lymphocytes Relative: 33.5 % (ref 12.0–46.0)
Lymphs Abs: 2.1 10*3/uL (ref 0.7–4.0)
MCHC: 33.8 g/dL (ref 30.0–36.0)
MCV: 87.9 fl (ref 78.0–100.0)
MONO ABS: 0.5 10*3/uL (ref 0.1–1.0)
Monocytes Relative: 7.9 % (ref 3.0–12.0)
Neutro Abs: 3.2 10*3/uL (ref 1.4–7.7)
Neutrophils Relative %: 50.8 % (ref 43.0–77.0)
Platelets: 182 10*3/uL (ref 150.0–400.0)
RBC: 3.89 Mil/uL (ref 3.87–5.11)
RDW: 15.4 % (ref 11.5–15.5)
WBC: 6.2 10*3/uL (ref 4.0–10.5)

## 2016-01-12 LAB — LIPID PANEL
CHOL/HDL RATIO: 5
Cholesterol: 172 mg/dL (ref 0–200)
HDL: 35.2 mg/dL — AB (ref >39.00)
LDL CALC: 119 mg/dL — AB (ref 0–99)
NonHDL: 137.06
TRIGLYCERIDES: 91 mg/dL (ref 0.0–149.0)
VLDL: 18.2 mg/dL (ref 0.0–40.0)

## 2016-01-16 ENCOUNTER — Other Ambulatory Visit: Payer: Self-pay | Admitting: *Deleted

## 2016-01-16 DIAGNOSIS — D649 Anemia, unspecified: Secondary | ICD-10-CM

## 2016-01-16 IMAGING — MR MR HEAD WO/W CM
12 series · 44 of 48 positions shown · IV contrast (multihance)
Comparison: None.

CLINICAL DATA: 64-year-old female with intermittent dizziness for 2
years. Symptoms increased x3 weeks. Vertigo.

Creatinine was obtained on site at [HOSPITAL] at [HOSPITAL].
Results: Creatinine 0.6 mg/dL.
EXAM:
MRI HEAD WITHOUT AND WITH CONTRAST
TECHNIQUE: Multiplanar, multiecho pulse sequences of the brain and surrounding
structures were obtained without and with intravenous contrast.
CONTRAST:  19mL MULTIHANCE GADOBENATE DIMEGLUMINE 529 MG/ML IV SOLN

[Series 2: t1_se_sag · sagittal · 5.0mm · 0.45mm/px · 2 of 21 slices shown]
[im 1/21]
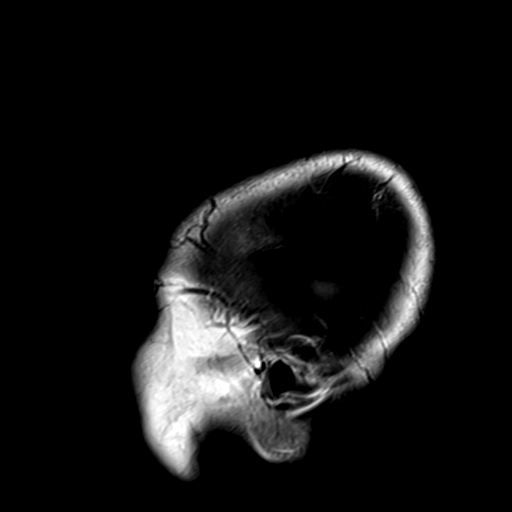
[im 21/21]
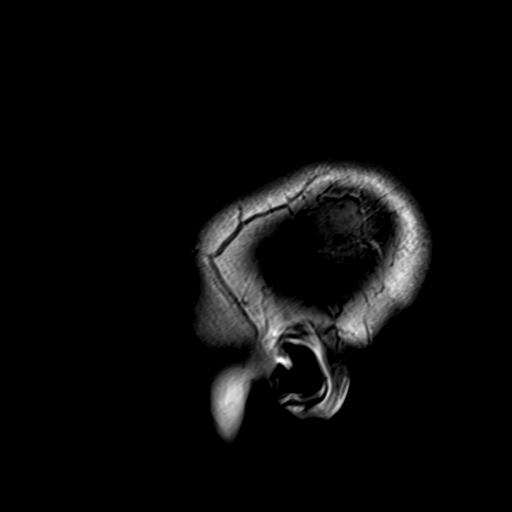

[Series 3: ep2d_diff_(id)_trace · axial · 3.0mm · 1.80mm/px · z∈[-105,+41]mm · 8 of 100 slices shown]
[im 1/100]
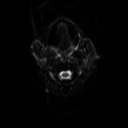
[im 15/100]
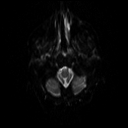
[im 29/100]
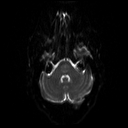
[im 43/100]
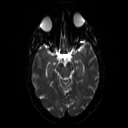
[im 57/100]
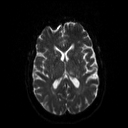
[im 71/100]
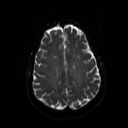
[im 85/100]
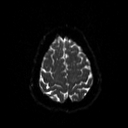
[im 100/100]
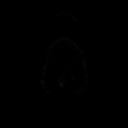

[Series 4: ep2d_diff_(id)_trace_adc · axial · 3.0mm · 1.80mm/px · z∈[-105,+41]mm · 4 of 49 slices shown]
[im 1/49]
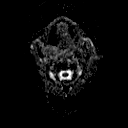
[im 17/49]
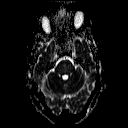
[im 33/49]
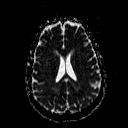
[im 49/49]
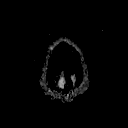

[Series 6: swi_images · axial · 2.0mm · 0.90mm/px · z∈[-110,+47]mm · 6 of 80 slices shown]
[im 1/80]
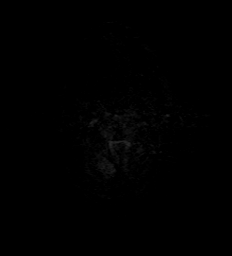
[im 16/80]
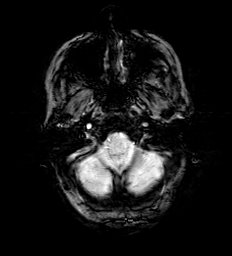
[im 32/80]
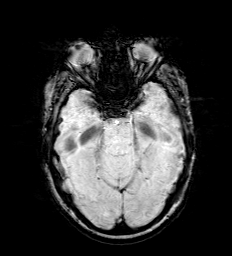
[im 48/80]
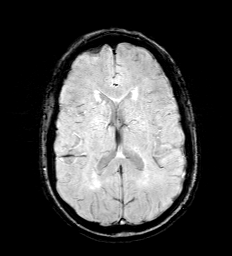
[im 64/80]
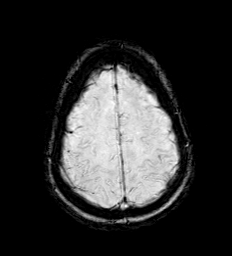
[im 80/80]
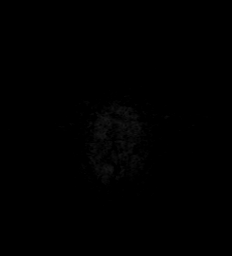

[Series 7: ep2d_diff cor for · coronal · 5.0mm · 0.90mm/px · 5 of 65 slices shown (1 of 2)]
[im 1/65]
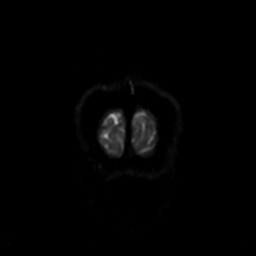
[im 17/65]
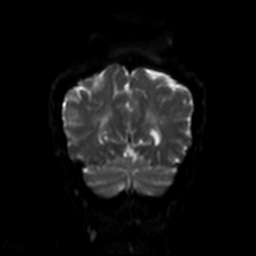
[im 33/65]
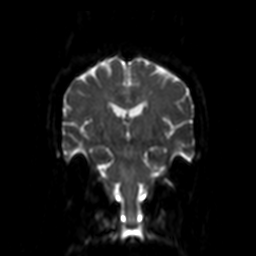
[im 49/65]
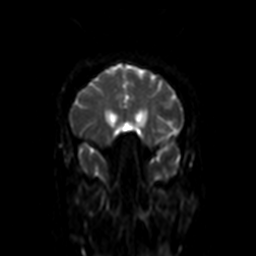
[im 65/65]
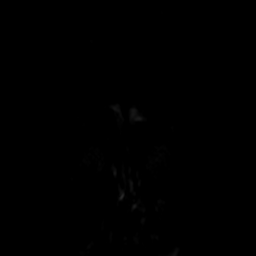

[Series 8: ep2d_diff cor for · coronal · 5.0mm · 0.90mm/px · 3 of 34 slices shown (2 of 2)]
[im 1/34]
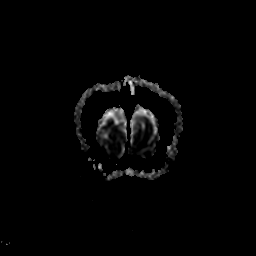
[im 17/34]
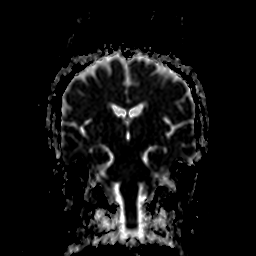
[im 34/34]
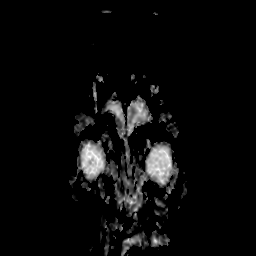

[Series 9: FLAIR · axial · 5.0mm · 0.45mm/px · z∈[-101,+37]mm · 2 of 23 slices shown]
[im 1/23]
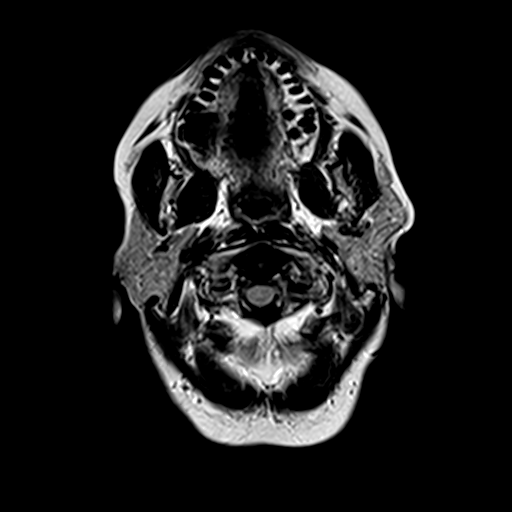
[im 23/23]
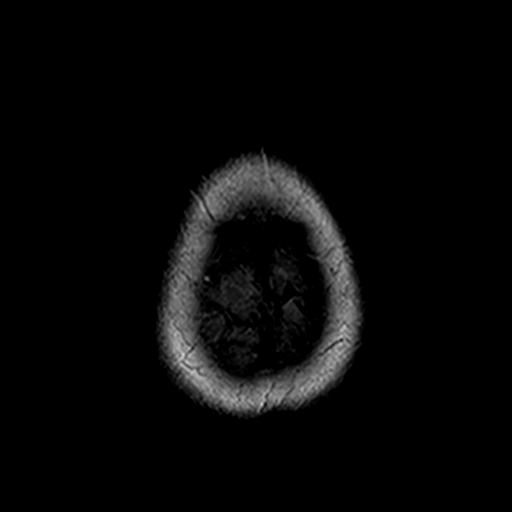

[Series 10: t2_tse_tra_512 · axial · 5.0mm · 0.60mm/px · z∈[-99,+38]mm · 2 of 23 slices shown]
[im 1/23]
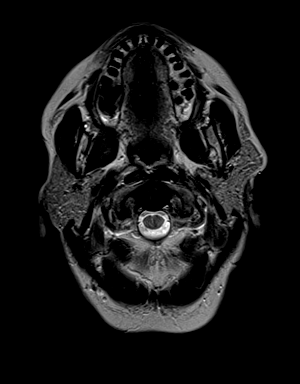
[im 23/23]
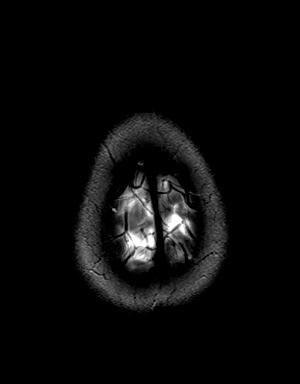

[Series 11: t1_mpr_tra · axial · 2.0mm · 0.45mm/px · z∈[-110,+48]mm · 6 of 80 slices shown]
[im 1/80]
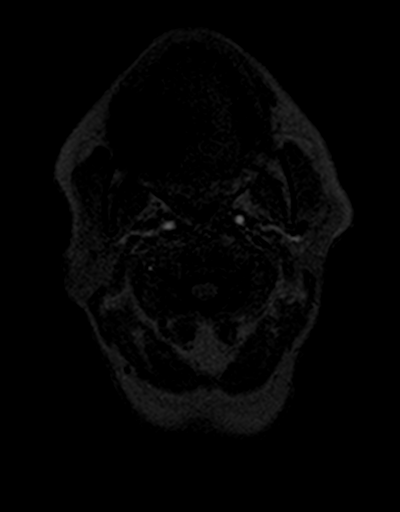
[im 16/80]
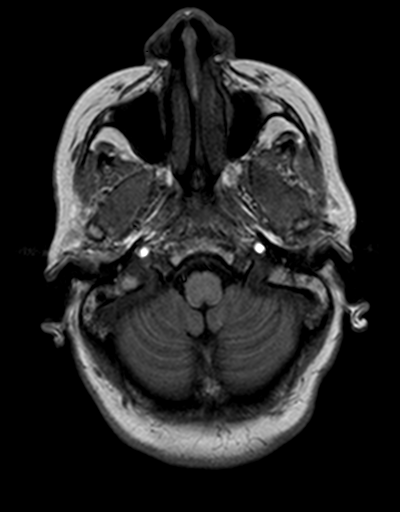
[im 32/80]
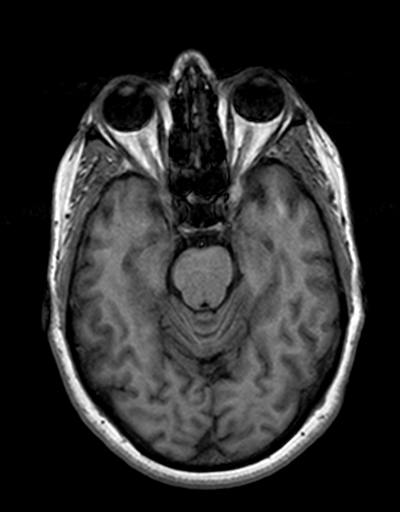
[im 48/80]
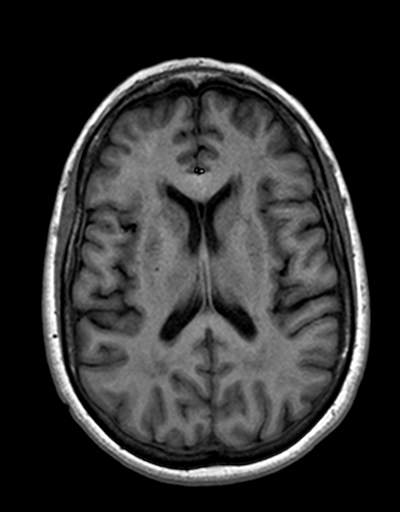
[im 64/80]
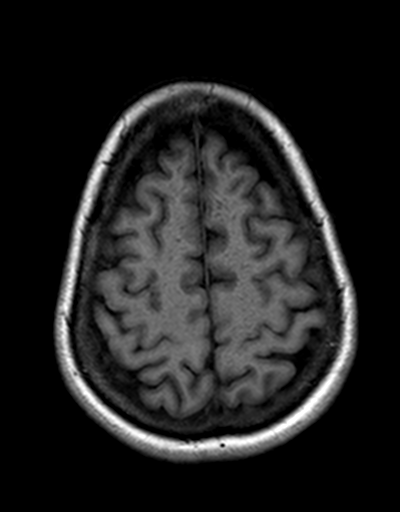
[im 80/80]
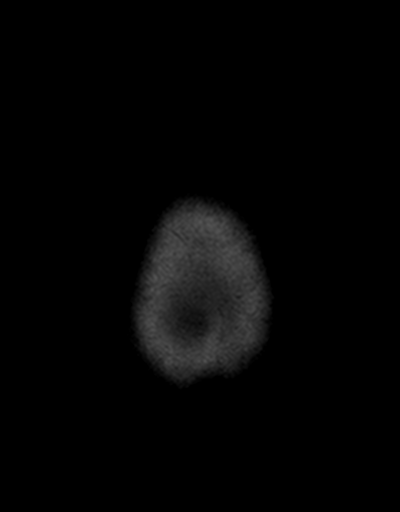

[Series 12: T2 · coronal · 5.0mm · 0.45mm/px · 2 of 25 slices shown]
[im 1/25]
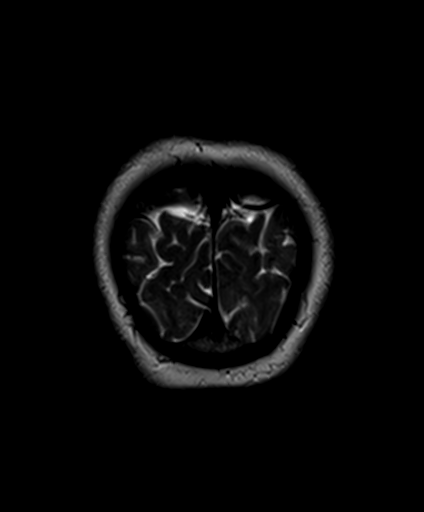
[im 25/25]
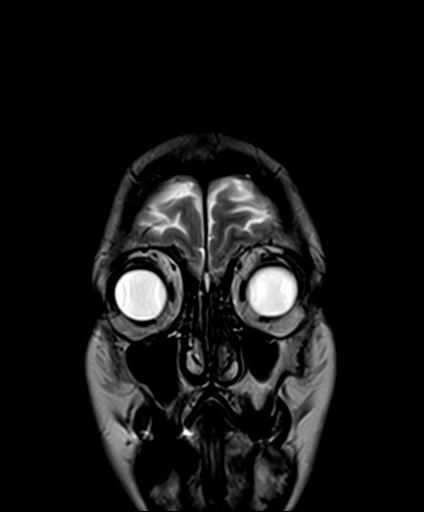

[Series 13: post cor · coronal · 5.0mm · 0.72mm/px · 2 of 25 slices shown]
[im 1/25]
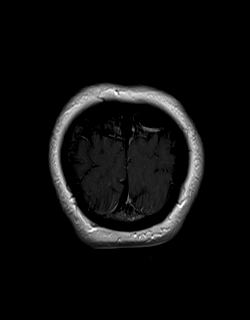
[im 25/25]
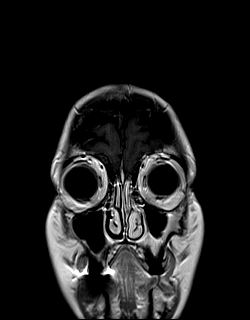

[Series 14: post t1_mpr_tra · axial · 2.0mm · 0.45mm/px · z∈[-110,-80]mm · 2 of 80 slices shown]
[im 1/80]
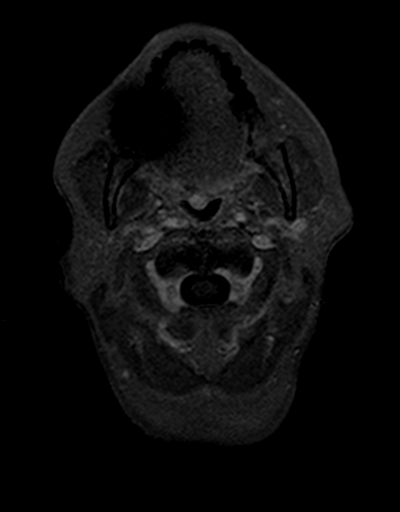
[im 16/80]
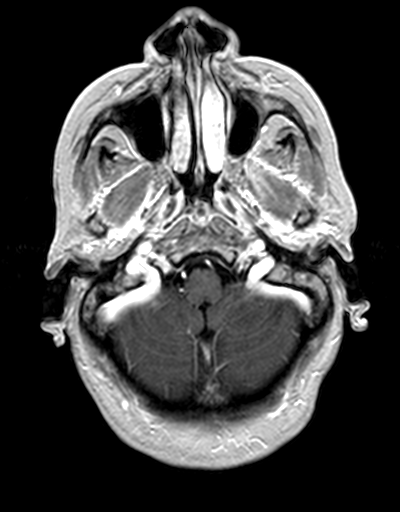

[44 of 48 positions shown; findings below may reference images not displayed]

FINDINGS: Cerebral volume is normal. No restricted diffusion to suggest acute
infarction. No midline shift, mass effect, evidence of mass lesion,
ventriculomegaly, extra-axial collection or acute intracranial
hemorrhage. Cervicomedullary junction and pituitary are within
normal limits. Major intracranial vascular flow voids are within
normal limits.

Patchy and confluent nonspecific T2 and FLAIR hyperintensity in the
cerebral white matter, dorsal left thalamus, and also the central
pons. The appearance favors chronic small vessel disease. No
cortical encephalomalacia or chronic cerebral blood products
identified. Other deep gray matter nuclei are within normal limits.
Cerebellum within normal limits. Visible internal auditory
structures appear normal. No abnormal enhancement identified.

Mastoids are clear. Mild bubbly opacity in the left sphenoid sinus.
Other paranasal sinuses are clear. Orbit and scalp soft tissues
appear normal. Negative visualized cervical spine. Visualized bone
marrow signal is within normal limits.
IMPRESSION: 1.  No acute intracranial abnormality.
2. Signal changes most suggestive of chronic small vessel ischemia
in the left thalamus, pons, and to a lesser extent cerebral white
matter.
3. Minimal inflammation in the left sphenoid sinus.

## 2016-02-08 ENCOUNTER — Other Ambulatory Visit: Payer: Self-pay | Admitting: Family Medicine

## 2016-02-09 ENCOUNTER — Other Ambulatory Visit: Payer: Self-pay | Admitting: *Deleted

## 2016-02-09 MED ORDER — METFORMIN HCL 1000 MG PO TABS: ORAL_TABLET | ORAL | Status: DC

## 2016-02-16 ENCOUNTER — Other Ambulatory Visit (INDEPENDENT_AMBULATORY_CARE_PROVIDER_SITE_OTHER): Payer: PPO

## 2016-02-16 DIAGNOSIS — D649 Anemia, unspecified: Secondary | ICD-10-CM

## 2016-02-16 LAB — CBC WITH DIFFERENTIAL/PLATELET
BASOS PCT: 0.4 % (ref 0.0–3.0)
Basophils Absolute: 0 10*3/uL (ref 0.0–0.1)
EOS ABS: 0.5 10*3/uL (ref 0.0–0.7)
EOS PCT: 7.4 % — AB (ref 0.0–5.0)
HCT: 34.8 % — ABNORMAL LOW (ref 36.0–46.0)
Hemoglobin: 11.8 g/dL — ABNORMAL LOW (ref 12.0–15.0)
LYMPHS ABS: 1.9 10*3/uL (ref 0.7–4.0)
Lymphocytes Relative: 29.2 % (ref 12.0–46.0)
MCHC: 34 g/dL (ref 30.0–36.0)
MCV: 88.5 fl (ref 78.0–100.0)
MONO ABS: 0.5 10*3/uL (ref 0.1–1.0)
Monocytes Relative: 7.6 % (ref 3.0–12.0)
NEUTROS ABS: 3.6 10*3/uL (ref 1.4–7.7)
NEUTROS PCT: 55.4 % (ref 43.0–77.0)
Platelets: 182 10*3/uL (ref 150.0–400.0)
RBC: 3.93 Mil/uL (ref 3.87–5.11)
RDW: 15.9 % — AB (ref 11.5–15.5)
WBC: 6.6 10*3/uL (ref 4.0–10.5)

## 2016-02-20 ENCOUNTER — Telehealth: Payer: Self-pay | Admitting: *Deleted

## 2016-02-20 NOTE — Telephone Encounter (Signed)
Patient called requesting her lab results from Thursday.  She stated she is feeling weak and wondered if her iron was low?  I advised her Dr Maudie Mercury has been out of the office and I will call her with the results once she reviews this.

## 2016-02-20 NOTE — Telephone Encounter (Signed)
See results note. 

## 2016-03-30 ENCOUNTER — Ambulatory Visit (INDEPENDENT_AMBULATORY_CARE_PROVIDER_SITE_OTHER): Payer: PPO | Admitting: Family Medicine

## 2016-03-30 ENCOUNTER — Encounter: Payer: Self-pay | Admitting: Family Medicine

## 2016-03-30 VITALS — BP 100/68 | HR 72 | Temp 98.8°F | Ht 66.0 in | Wt 187.9 lb

## 2016-03-30 DIAGNOSIS — J0100 Acute maxillary sinusitis, unspecified: Secondary | ICD-10-CM | POA: Diagnosis not present

## 2016-03-30 DIAGNOSIS — M26609 Unspecified temporomandibular joint disorder, unspecified side: Secondary | ICD-10-CM

## 2016-03-30 MED ORDER — AMOXICILLIN-POT CLAVULANATE 875-125 MG PO TABS: 1.0000 | ORAL_TABLET | Freq: Two times a day (BID) | ORAL | 0 refills | Status: DC

## 2016-03-30 NOTE — Progress Notes (Signed)
Pre visit review using our clinic review tool, if applicable. No additional management support is needed unless otherwise documented below in the visit note. 

## 2016-03-30 NOTE — Patient Instructions (Addendum)
BEFORE YOU LEAVE: -tmj exercises -follow up: cancel any current pending appt, schedule follow up in 1 month  Take the antibiotic as prescribed  Continue allergy treatments  Do the exercises 5 days per week  Follow up as planned or sooner if worsening, new symptoms or not improving with treatment

## 2016-03-30 NOTE — Progress Notes (Signed)
HPI:  Acute visit for several issues:  Sinusitis: -started 2-3 weeks ago -worsening of chronic allergy symptoms, with white PND, max sinus pain worse with leaning forward, sinus congestion, fatigue -denies: fevers, nausea, vomiting, sob, wheezing -taking her zyretec and flonase  R TMJ pain: -with chewing -no catching or locking  ROS: See pertinent positives and negatives per HPI.  Past Medical History:  Diagnosis Date  . Benign paroxysmal positional vertigo 06/25/2013   dx with prior PCP, s/p labs, mri and vestibular rehab; nasal spray for etd seemed to help  . Blood in stool 03/11/2012  . Dependent edema    Bilateral  . Depression   . GERD (gastroesophageal reflux disease)   . Hyperlipidemia   . Low back pain    sciatica; sees orthopedic specialist ([facility])for this and shoulder tendinitis  . Obesity, diabetes, and hypertension syndrome (HCC)   . Osteoarthritis, knee    Bilateral, s/p 3 total knee replacement surgerues on right, last 2005. Dr. Shelle Iron  . Pellegrini-Steida syndrome    Myositis ossificans  . Seasonal allergies   . Shingles Jul 10 2012   LESIONS CLEARED BUT NOT TOLERATING ANY THING TIGHT AGAINST BODY--SHINGLES WERE AROSS BACK AND ABDOMEN    Past Surgical History:  Procedure Laterality Date  . ABDOMINAL HYSTERECTOMY  1984  . COLONOSCOPY    . left shoulder steroid injection    . TONSILLECTOMY  1977  . TOTAL HIP ARTHROPLASTY Right 10/10/2012   Procedure: R TOTAL HIP ARTHROPLASTY ANTERIOR APPROACH;  Surgeon: Kathryne Hitch, MD;  Location: [facility];  Service: Orthopedics;  Laterality: Right;  Right Total Hip Arthroplasty, Anterior Approach (C-Arm)  . TOTAL KNEE ARTHROPLASTY Right    with multiple revisions, 2003, 2005  . TOTAL KNEE ARTHROPLASTY Left 01/09/2013   Procedure: LEFT TOTAL KNEE ARTHROPLASTY;  Surgeon: Kathryne Hitch, MD;  Location: [facility];  Service: Orthopedics;  Laterality: Left;  . TUBAL LIGATION  1978  . WISDOM TOOTH  EXTRACTION  2013,2014    Family History  Problem Relation Age of Onset  . Diabetes Mother   . Hypertension Mother   . Hypertension Father   . Cancer Father     Lung CA  . Coronary artery disease Father   . Colon cancer Neg Hx     Social History   Social History  . Marital status: Married    Spouse name: N/A  . Number of children: N/A  . Years of education: 12th grade   Occupational History  .  [employer]   Social History Main Topics  . Smoking status: Never Smoker  . Smokeless tobacco: Never Used  . Alcohol use No  . Drug use: No  . Sexual activity: Not Asked   Other Topics Concern  . None   Social History Narrative   Married, 3 daughters.   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   Nicotine: never   ETOH: no   Drugs: no     Current Outpatient Prescriptions:  .  aspirin 81 MG tablet, Take 81 mg by mouth daily., Disp: , Rfl:  .  Blood Glucose Monitoring Suppl (ONETOUCH VERIO IQ SYSTEM) W/DEVICE KIT, by Does not apply route., Disp: , Rfl:  .  cetirizine (ZYRTEC) 10 MG tablet, Take 10 mg by mouth at bedtime., Disp: , Rfl:  .  fluticasone (FLONASE) 50 MCG/ACT nasal spray, Place 2 sprays into both nostrils daily., Disp: 16 g, Rfl: 6 .  furosemide (LASIX) 40 MG tablet, Take  0.5 tablets (20 mg total) by mouth daily., Disp: 30 tablet, Rfl: 1 .  gabapentin (NEURONTIN) 300 MG capsule, TAKE ONE CAPSULE BY MOUTH AT BEDTIME, Disp: 90 capsule, Rfl: 1 .  glucose blood test strip, Use as directed to check blood sugar once a day, Disp: 100 each, Rfl: 3 .  lisinopril (PRINIVIL,ZESTRIL) 20 MG tablet, TAKE 1 TABLET BY MOUTH EVERY DAY, Disp: 90 tablet, Rfl: 1 .  metFORMIN (GLUCOPHAGE) 1000 MG tablet, TAKE 1 TABLET BY MOUTH TWICE DAILY WITH A MEAL, Disp: 180 tablet, Rfl: 1 .  omeprazole (PRILOSEC) 10 MG capsule, Take 10 mg by mouth daily as needed., Disp: , Rfl:  .  ONETOUCH VERIO test strip, USE AS DIRECTED TO TEST BLOOD SUGAR ONCE DAILY,  Disp: 100 each, Rfl: 2 .  pravastatin (PRAVACHOL) 40 MG tablet, Take 1.5 tablets (60 mg total) by mouth daily., Disp: 135 tablet, Rfl: 1 .  amoxicillin-clavulanate (AUGMENTIN) 875-125 MG tablet, Take 1 tablet by mouth 2 (two) times daily., Disp: 20 tablet, Rfl: 0  EXAM:  Vitals:   03/30/16 0916  BP: 100/68  Pulse: 72  Temp: 98.8 F (37.1 C)    Body mass index is 30.33 kg/m.  GENERAL: vitals reviewed and listed above, alert, oriented, appears well hydrated and in no acute distress  HEENT: atraumatic, conjunttiva clear, no obvious abnormalities on inspection of external nose and ears, normal appearance of ear canals and TMs, thick nasal congestion, mild post oropharyngeal erythema with PND, no tonsillar edema or exudate, R max sinus TTP, bilat TMJ crepitus, no catching or lock - some TTP over R TMJ  NECK: no obvious masses on inspection  LUNGS: clear to auscultation bilaterally, no wheezes, rales or rhonchi, good air movement  CV: HRRR, no peripheral edema  MS: moves all extremities without noticeable abnormality  PSYCH: pleasant and cooperative, no obvious depression or anxiety  ASSESSMENT AND PLAN:  Discussed the following assessment and plan:  Acute maxillary sinusitis, recurrence not specified -treat with abx, risks and use discussed -cont allergy regimen -return precautions  TMJ dysfunction -HEP -f/u 1 month with regular f/u  -Patient advised to return or notify a doctor immediately if symptoms worsen or persist or new concerns arise.  Patient Instructions  BEFORE YOU LEAVE: -tmj exercises -follow up: cancel any current pending appt, schedule follow up in 1 month  Take the antibiotic as prescribed  Continue allergy treatments  Do the exercises 5 days per week  Follow up as planned or sooner if worsening, new symptoms or not improving with treatment   Colin Benton R., DO

## 2016-04-05 ENCOUNTER — Ambulatory Visit: Payer: PPO | Admitting: Family Medicine

## 2016-04-11 ENCOUNTER — Other Ambulatory Visit: Payer: Self-pay | Admitting: Family Medicine

## 2016-05-06 ENCOUNTER — Other Ambulatory Visit: Payer: Self-pay | Admitting: Family Medicine

## 2016-05-09 NOTE — Progress Notes (Signed)
HPI:  DM:  -complications: neuropathy, microvascular disease -meds: metformin '1000mg'$  bid, asa, acei  -diet and exercise: trying to work on lifestyle and has made significant changes in diet and is exercising -foot exam: done -eye exam: done -lipids above goal 12/2015 -denies: polyuria, polydipsia, vision changes, wounds of feet, hypoglycemia   HLD/Obesity:  -meds: pravastatin - agreeable to change in statin if not controlled this visit -denies: leg cramps, cog issues on increased statin  HTN/LE edema:  -meds: lisinopril, lasix  -denies: CP, SOB, palpitations, HA   GERD:  -meds:omeprazole  -denies: dysphagia   Anemia: -chronic, intermittent -last colon 10/2013 with small benign cecal ulcers, several polyps - repeat colon advised in 5 yers -b12 ok in the past, ferritin ok last check  ROS: See pertinent positives and negatives per HPI.  Past Medical History:  Diagnosis Date  . Benign paroxysmal positional vertigo 06/25/2013   dx with prior PCP, s/p labs, mri and vestibular rehab; nasal spray for etd seemed to help  . Blood in stool 03/11/2012  . Dependent edema    Bilateral  . Depression   . GERD (gastroesophageal reflux disease)   . Hyperlipidemia   . Low back pain    sciatica; sees orthopedic specialist ([facility])for this and shoulder tendinitis  . Obesity, diabetes, and hypertension syndrome ([place])   . Osteoarthritis, knee    Bilateral, s/p 3 total knee replacement surgerues on right, last 2005. Dr. Tonita Cong  . Pellegrini-Steida syndrome    Myositis ossificans  . Seasonal allergies   . Shingles Jul 10 2012   LESIONS CLEARED BUT NOT TOLERATING ANY THING TIGHT AGAINST BODY--SHINGLES WERE AROSS BACK AND ABDOMEN    Past Surgical History:  Procedure Laterality Date  . ABDOMINAL HYSTERECTOMY  1984  . COLONOSCOPY    . left shoulder steroid injection    . TONSILLECTOMY  1977  . TOTAL HIP ARTHROPLASTY Right 10/10/2012   Procedure: R TOTAL HIP ARTHROPLASTY  ANTERIOR APPROACH;  Surgeon: Mcarthur Rossetti, MD;  Location: [facility];  Service: Orthopedics;  Laterality: Right;  Right Total Hip Arthroplasty, Anterior Approach (C-Arm)  . TOTAL KNEE ARTHROPLASTY Right    with multiple revisions, 2003, 2005  . TOTAL KNEE ARTHROPLASTY Left 01/09/2013   Procedure: LEFT TOTAL KNEE ARTHROPLASTY;  Surgeon: Mcarthur Rossetti, MD;  Location: [facility];  Service: Orthopedics;  Laterality: Left;  . TUBAL LIGATION  1978  . WISDOM TOOTH EXTRACTION  2013,2014    Family History  Problem Relation Age of Onset  . Diabetes Mother   . Hypertension Mother   . Hypertension Father   . Cancer Father     Lung CA  . Coronary artery disease Father   . Colon cancer Neg Hx     Social History   Social History  . Marital status: Married    Spouse name: N/A  . Number of children: N/A  . Years of education: 12th grade   Occupational History  .  [employer]   Social History Main Topics  . Smoking status: Never Smoker  . Smokeless tobacco: Never Used  . Alcohol use No  . Drug use: No  . Sexual activity: Not Asked   Other Topics Concern  . None   Social History Narrative   Married, 3 daughters.   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   Nicotine: never   ETOH: no   Drugs: no     Current Outpatient Prescriptions:  .  aspirin 81 MG tablet,  Take 81 mg by mouth daily., Disp: , Rfl:  .  Blood Glucose Monitoring Suppl (ONETOUCH VERIO IQ SYSTEM) W/DEVICE KIT, by Does not apply route., Disp: , Rfl:  .  cetirizine (ZYRTEC) 10 MG tablet, Take 10 mg by mouth at bedtime., Disp: , Rfl:  .  fluticasone (FLONASE) 50 MCG/ACT nasal spray, SHAKE LIQUID AND USE 2 SPRAYS IN EACH NOSTRIL DAILY, Disp: 16 g, Rfl: 2 .  furosemide (LASIX) 40 MG tablet, Take 0.5 tablets (20 mg total) by mouth daily., Disp: 30 tablet, Rfl: 1 .  gabapentin (NEURONTIN) 300 MG capsule, TAKE ONE CAPSULE BY MOUTH AT BEDTIME, Disp: 90 capsule, Rfl: 1 .  glucose  blood test strip, Use as directed to check blood sugar once a day, Disp: 100 each, Rfl: 3 .  lisinopril (PRINIVIL,ZESTRIL) 20 MG tablet, TAKE 1 TABLET BY MOUTH EVERY DAY, Disp: 90 tablet, Rfl: 1 .  metFORMIN (GLUCOPHAGE) 1000 MG tablet, TAKE 1 TABLET BY MOUTH TWICE DAILY WITH A MEAL, Disp: 180 tablet, Rfl: 1 .  omeprazole (PRILOSEC) 10 MG capsule, Take 10 mg by mouth daily as needed., Disp: , Rfl:  .  ONETOUCH VERIO test strip, USE AS DIRECTED TO TEST BLOOD SUGAR ONCE DAILY, Disp: 100 each, Rfl: 2 .  pravastatin (PRAVACHOL) 40 MG tablet, TAKE 1 AND 1/2 TABLETS(60 MG) BY MOUTH DAILY, Disp: 135 tablet, Rfl: 2  EXAM:  Vitals:   05/10/16 0910  BP: 100/60  Pulse: 76  Temp: 98.2 F (36.8 C)    Body mass index is 30.13 kg/m.  GENERAL: vitals reviewed and listed above, alert, oriented, appears well hydrated and in no acute distress  HEENT: atraumatic, conjunttiva clear, no obvious abnormalities on inspection of external nose and ears  NECK: no obvious masses on inspection  LUNGS: clear to auscultation bilaterally, no wheezes, rales or rhonchi, good air movement  CV: HRRR, no peripheral edema  MS: moves all extremities without noticeable abnormality  PSYCH: pleasant and cooperative, no obvious depression or anxiety  ASSESSMENT AND PLAN:  Discussed the following assessment and plan:  Type II diabetes mellitus with neurological manifestations (HCC) - Plan: Hemoglobin A1c  Hyperlipemia - Plan: Lipid Panel  Essential hypertension - Plan: Basic metabolic panel, CBC (no diff)  Anemia, unspecified anemia type - Plan: Ferritin  BMI 30.0-30.9,adult  Encounter for immunization - Plan: Hemoglobin N2T, Basic metabolic panel, CBC (no diff), Ferritin, Lipid Panel, Flu vaccine HIGH DOSE PF  Need for prophylactic vaccination against Streptococcus pneumoniae (pneumococcus) - Plan: Pneumococcal polysaccharide vaccine 23-valent greater than or equal to 2yo subcutaneous/IM  -labs: hgba1c, bmp,  cbc, ferritin, lipid -low dose iron if iron low ferritin -consider change to crestor - discussed, she plans to check cholesterol first -lifestyle recs -Patient advised to return or notify a doctor immediately if symptoms worsen or persist or new concerns arise.  Patient Instructions  BEFORE YOU LEAVE: -flu and PPSV23 vaccines -follow up: 3-4 months -labs  We have ordered labs or studies at this visit. It can take up to 1-2 weeks for results and processing. IF results require follow up or explanation, we will call you with instructions. Clinically stable results will be released to your [facility]. If you have not heard from [place] or cannot find your results in [facility] in 2 weeks please contact our office at [phone].  If you are not yet signed up for [facility], please consider signing up.   We recommend the following healthy lifestyle for LIFE: 1) Small portions.   Tip: eat off of a  salad plate instead of a dinner plate.  Tip: It is ok to feel hungry after a meal - that likely means you ate an appropriate portion.  Tip: if you need more or a snack choose fruits, veggies and/or a handful of nuts or seeds.  2) Eat a healthy clean diet.  * Tip: Avoid (less then 1 serving per week): processed foods, sweets, sweetened drinks, white starches (rice, flour, bread, potatoes, pasta, etc), red meat, fast foods, butter  *Tip: CHOOSE instead   * 5-9 servings per day of fresh or frozen fruits and vegetables (but not corn, potatoes, bananas, canned or dried fruit)   *nuts and seeds, beans   *olives and olive oil   *small portions of lean meats such as fish and white chicken    *small portions of whole grains  3)Get at least 150 minutes of sweaty aerobic exercise per week.  4)Reduce stress - consider counseling, meditation and relaxation to balance other aspects of your life.            Colin Benton R., DO

## 2016-05-10 ENCOUNTER — Ambulatory Visit (INDEPENDENT_AMBULATORY_CARE_PROVIDER_SITE_OTHER): Payer: PPO | Admitting: Family Medicine

## 2016-05-10 ENCOUNTER — Encounter: Payer: Self-pay | Admitting: Family Medicine

## 2016-05-10 VITALS — BP 100/60 | HR 76 | Temp 98.2°F | Ht 66.0 in | Wt 186.7 lb

## 2016-05-10 DIAGNOSIS — E1149 Type 2 diabetes mellitus with other diabetic neurological complication: Secondary | ICD-10-CM

## 2016-05-10 DIAGNOSIS — E785 Hyperlipidemia, unspecified: Secondary | ICD-10-CM | POA: Diagnosis not present

## 2016-05-10 DIAGNOSIS — Z23 Encounter for immunization: Secondary | ICD-10-CM

## 2016-05-10 DIAGNOSIS — D649 Anemia, unspecified: Secondary | ICD-10-CM

## 2016-05-10 DIAGNOSIS — Z6830 Body mass index (BMI) 30.0-30.9, adult: Secondary | ICD-10-CM

## 2016-05-10 DIAGNOSIS — I10 Essential (primary) hypertension: Secondary | ICD-10-CM

## 2016-05-10 LAB — BASIC METABOLIC PANEL
BUN: 9 mg/dL (ref 6–23)
CALCIUM: 9.5 mg/dL (ref 8.4–10.5)
CO2: 32 mEq/L (ref 19–32)
Chloride: 104 mEq/L (ref 96–112)
Creatinine, Ser: 0.41 mg/dL (ref 0.40–1.20)
GFR: 199.51 mL/min (ref >60.00)
Glucose, Bld: 118 mg/dL — ABNORMAL HIGH (ref 70–99)
POTASSIUM: 4.3 meq/L (ref 3.5–5.1)
SODIUM: 141 meq/L (ref 135–145)

## 2016-05-10 LAB — CBC
HCT: 36.9 % (ref 36.0–46.0)
HEMOGLOBIN: 12.5 g/dL (ref 12.0–15.0)
MCHC: 33.9 g/dL (ref 30.0–36.0)
MCV: 89.1 fl (ref 78.0–100.0)
PLATELETS: 178 10*3/uL (ref 150.0–400.0)
RBC: 4.14 Mil/uL (ref 3.87–5.11)
RDW: 13.8 % (ref 11.5–15.5)
WBC: 7.1 10*3/uL (ref 4.0–10.5)

## 2016-05-10 LAB — LIPID PANEL
CHOLESTEROL: 136 mg/dL (ref 0–200)
HDL: 44 mg/dL (ref >39.00)
LDL CALC: 67 mg/dL (ref 0–99)
NonHDL: 92.22
TRIGLYCERIDES: 127 mg/dL (ref 0.0–149.0)
Total CHOL/HDL Ratio: 3
VLDL: 25.4 mg/dL (ref 0.0–40.0)

## 2016-05-10 LAB — FERRITIN: Ferritin: 48.2 ng/mL (ref 10.0–291.0)

## 2016-05-10 LAB — HEMOGLOBIN A1C: HEMOGLOBIN A1C: 6.3 % (ref 4.6–6.5)

## 2016-05-10 NOTE — Progress Notes (Signed)
Pre visit review using our clinic review tool, if applicable. No additional management support is needed unless otherwise documented below in the visit note. 

## 2016-05-10 NOTE — Patient Instructions (Signed)
BEFORE YOU LEAVE: -flu and PPSV23 vaccines -follow up: 3-4 months -labs  We have ordered labs or studies at this visit. It can take up to 1-2 weeks for results and processing. IF results require follow up or explanation, we will call you with instructions. Clinically stable results will be released to your [facility]. If you have not heard from [place] or cannot find your results in [facility] in 2 weeks please contact our office at [phone].  If you are not yet signed up for [facility], please consider signing up.   We recommend the following healthy lifestyle for LIFE: 1) Small portions.   Tip: eat off of a salad plate instead of a dinner plate.  Tip: It is ok to feel hungry after a meal - that likely means you ate an appropriate portion.  Tip: if you need more or a snack choose fruits, veggies and/or a handful of nuts or seeds.  2) Eat a healthy clean diet.  * Tip: Avoid (less then 1 serving per week): processed foods, sweets, sweetened drinks, white starches (rice, flour, bread, potatoes, pasta, etc), red meat, fast foods, butter  *Tip: CHOOSE instead   * 5-9 servings per day of fresh or frozen fruits and vegetables (but not corn, potatoes, bananas, canned or dried fruit)   *nuts and seeds, beans   *olives and olive oil   *small portions of lean meats such as fish and white chicken    *small portions of whole grains  3)Get at least 150 minutes of sweaty aerobic exercise per week.  4)Reduce stress - consider counseling, meditation and relaxation to balance other aspects of your life.

## 2016-05-18 ENCOUNTER — Other Ambulatory Visit: Payer: Self-pay | Admitting: Pharmacist

## 2016-05-18 NOTE — Patient Outreach (Addendum)
Outreach call to [employer] regarding her request for follow up from the [facility] Medication Adherence Campaign. Called and spoke with patient. HIPAA identifiers verified and verbal consent received.  Ms. Penfold reports that she takes her lisinopril once daily as directed. Reports that she takes her furosemide on an as needed basis as she has been directed to do by her PCP. Denies any missed doses or any barriers to taking her medications such as cost or side effects.   Patient reports that she has no medication questions or concerns at this time. Provide patient with my phone number.  Harlow Asa, PharmD Clinical Pharmacist [facility] [phone]

## 2016-06-11 ENCOUNTER — Other Ambulatory Visit: Payer: Self-pay | Admitting: Family Medicine

## 2016-06-25 ENCOUNTER — Encounter: Payer: Self-pay | Admitting: Family Medicine

## 2016-06-25 ENCOUNTER — Ambulatory Visit (INDEPENDENT_AMBULATORY_CARE_PROVIDER_SITE_OTHER): Payer: PPO | Admitting: Family Medicine

## 2016-06-25 VITALS — BP 102/70 | HR 88 | Temp 98.0°F | Ht 66.0 in | Wt 189.5 lb

## 2016-06-25 DIAGNOSIS — J069 Acute upper respiratory infection, unspecified: Secondary | ICD-10-CM

## 2016-06-25 MED ORDER — BENZONATATE 100 MG PO CAPS: 100.0000 mg | ORAL_CAPSULE | Freq: Two times a day (BID) | ORAL | 0 refills | Status: DC | PRN

## 2016-06-25 NOTE — Patient Instructions (Signed)
INSTRUCTIONS FOR UPPER RESPIRATORY INFECTION:  -nasal saline wash 2-3 times daily (use prepackaged nasal saline or bottled/distilled water if making your own)   -AFRIN nasal spray for drainage and nasal congestion for 4 days - but do NOT use longer then 4 days  -can use tylenol (in no history of liver disease) or ibuprofen (if no history of kidney disease, bowel bleeding or significant heart disease) as directed for aches and sorethroat  -in the winter time, using a humidifier at night is helpful (please follow cleaning instructions)  -if you are taking a cough medication - use only as directed, may also try a teaspoon of honey to coat the throat and throat lozenges.  -for sore throat, salt water gargles can help  -follow up if you have fevers, facial pain, tooth pain, difficulty breathing or are worsening or symptoms persist longer then expected  Upper Respiratory Infection, Adult An upper respiratory infection (URI) is also known as the common cold. It is often caused by a type of germ (virus). Colds are easily spread (contagious). You can pass it to others by kissing, coughing, sneezing, or drinking out of the same glass. Usually, you get better in 1 to 3  weeks.  However, the cough can last for even longer. HOME CARE   Only take medicine as told by your doctor. Follow instructions provided above.  Drink enough water and fluids to keep your pee (urine) clear or pale yellow.  Get plenty of rest.  Return to work when your temperature is < 100 for 24 hours or as told by your doctor. You may use a face mask and wash your hands to stop your cold from spreading. GET HELP RIGHT AWAY IF:   After the first few days, you feel you are getting worse.  You have questions about your medicine.  You have chills, shortness of breath, or red spit (mucus).  You have pain in the face for more then 1-2 days, especially when you bend forward.  You have a fever, puffy (swollen) neck, pain when you  swallow, or white spots in the back of your throat.  You have a bad headache, ear pain, sinus pain, or chest pain.  You have a high-pitched whistling sound when you breathe in and out (wheezing).  You cough up blood.  You have sore muscles or a stiff neck. MAKE SURE YOU:   Understand these instructions.  Will watch your condition.  Will get help right away if you are not doing well or get worse. Document Released: 01/30/2008 Document Revised: 11/05/2011 Document Reviewed: 11/18/2013 [facility] Patient Information 2015 [place], [place]. This information is not intended to replace advice given to you by your health care provider. Make sure you discuss any questions you have with your health care provider.

## 2016-06-25 NOTE — Progress Notes (Signed)
Pre visit review using our clinic review tool, if applicable. No additional management support is needed unless otherwise documented below in the visit note. 

## 2016-06-25 NOTE — Progress Notes (Signed)
HPI:  URI: -started: about 5 days ago -symptoms:nasal congestion, sore throat, cough, PND -denies:fever, SOB, NVD, tooth pain, sinus pain -has tried: nothing -sick contacts/travel/risks: no reported flu, strep or tick exposure -Hx of: allergies  ROS: See pertinent positives and negatives per HPI.  Past Medical History:  Diagnosis Date  . Benign paroxysmal positional vertigo 06/25/2013   dx with prior PCP, s/p labs, mri and vestibular rehab; nasal spray for etd seemed to help  . Blood in stool 03/11/2012  . Dependent edema    Bilateral  . Depression   . GERD (gastroesophageal reflux disease)   . Hyperlipidemia   . Low back pain    sciatica; sees orthopedic specialist ([facility])for this and shoulder tendinitis  . Obesity, diabetes, and hypertension syndrome ([place])   . Osteoarthritis, knee    Bilateral, s/p 3 total knee replacement surgerues on right, last 2005. Dr. Tonita Cong  . Pellegrini-Steida syndrome    Myositis ossificans  . Seasonal allergies   . Shingles Jul 10 2012   LESIONS CLEARED BUT NOT TOLERATING ANY THING TIGHT AGAINST BODY--SHINGLES WERE AROSS BACK AND ABDOMEN    Past Surgical History:  Procedure Laterality Date  . ABDOMINAL HYSTERECTOMY  1984  . COLONOSCOPY    . left shoulder steroid injection    . TONSILLECTOMY  1977  . TOTAL HIP ARTHROPLASTY Right 10/10/2012   Procedure: R TOTAL HIP ARTHROPLASTY ANTERIOR APPROACH;  Surgeon: Mcarthur Rossetti, MD;  Location: [facility];  Service: Orthopedics;  Laterality: Right;  Right Total Hip Arthroplasty, Anterior Approach (C-Arm)  . TOTAL KNEE ARTHROPLASTY Right    with multiple revisions, 2003, 2005  . TOTAL KNEE ARTHROPLASTY Left 01/09/2013   Procedure: LEFT TOTAL KNEE ARTHROPLASTY;  Surgeon: Mcarthur Rossetti, MD;  Location: [facility];  Service: Orthopedics;  Laterality: Left;  . TUBAL LIGATION  1978  . WISDOM TOOTH EXTRACTION  2013,2014    Family History  Problem Relation Age of Onset  . Diabetes Mother    . Hypertension Mother   . Hypertension Father   . Cancer Father     Lung CA  . Coronary artery disease Father   . Colon cancer Neg Hx     Social History   Social History  . Marital status: Married    Spouse name: N/A  . Number of children: N/A  . Years of education: 12th grade   Occupational History  .  [employer]   Social History Main Topics  . Smoking status: Never Smoker  . Smokeless tobacco: Never Used  . Alcohol use No  . Drug use: No  . Sexual activity: Not Asked   Other Topics Concern  . None   Social History Narrative   Married, 3 daughters.   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   Nicotine: never   ETOH: no   Drugs: no     Current Outpatient Prescriptions:  .  aspirin 81 MG tablet, Take 81 mg by mouth daily., Disp: , Rfl:  .  Blood Glucose Monitoring Suppl (ONETOUCH VERIO IQ SYSTEM) W/DEVICE KIT, by Does not apply route., Disp: , Rfl:  .  cetirizine (ZYRTEC) 10 MG tablet, Take 10 mg by mouth at bedtime., Disp: , Rfl:  .  fluticasone (FLONASE) 50 MCG/ACT nasal spray, SHAKE LIQUID AND USE 2 SPRAYS IN EACH NOSTRIL DAILY, Disp: 16 g, Rfl: 2 .  furosemide (LASIX) 40 MG tablet, Take 0.5 tablets (20 mg total) by mouth daily., Disp: 30 tablet, Rfl:  1 .  gabapentin (NEURONTIN) 300 MG capsule, TAKE ONE CAPSULE BY MOUTH AT BEDTIME, Disp: 90 capsule, Rfl: 1 .  glucose blood test strip, Use as directed to check blood sugar once a day, Disp: 100 each, Rfl: 3 .  lisinopril (PRINIVIL,ZESTRIL) 20 MG tablet, TAKE 1 TABLET BY MOUTH EVERY DAY, Disp: 90 tablet, Rfl: 2 .  metFORMIN (GLUCOPHAGE) 1000 MG tablet, TAKE 1 TABLET BY MOUTH TWICE DAILY WITH A MEAL, Disp: 180 tablet, Rfl: 1 .  omeprazole (PRILOSEC) 10 MG capsule, Take 10 mg by mouth daily as needed., Disp: , Rfl:  .  ONETOUCH VERIO test strip, USE AS DIRECTED TO TEST BLOOD SUGAR ONCE DAILY, Disp: 100 each, Rfl: 2 .  pravastatin (PRAVACHOL) 40 MG tablet, TAKE 1 AND 1/2  TABLETS(60 MG) BY MOUTH DAILY, Disp: 135 tablet, Rfl: 2 .  benzonatate (TESSALON) 100 MG capsule, Take 1 capsule (100 mg total) by mouth 2 (two) times daily as needed for cough., Disp: 20 capsule, Rfl: 0  EXAM:  Vitals:   06/25/16 1324  BP: 102/70  Pulse: 88  Temp: 98 F (36.7 C)    Body mass index is 30.59 kg/m.  GENERAL: vitals reviewed and listed above, alert, oriented, appears well hydrated and in no acute distress  HEENT: atraumatic, conjunttiva clear, no obvious abnormalities on inspection of external nose and ears, normal appearance of ear canals and TMs except for L ear effusion - not purulent, clear nasal congestion, mild post oropharyngeal erythema with PND, no tonsillar edema or exudate, no sinus TTP  NECK: no obvious masses on inspection  LUNGS: clear to auscultation bilaterally, no wheezes, rales or rhonchi, good air movement  CV: HRRR, no peripheral edema  MS: moves all extremities without noticeable abnormality  PSYCH: pleasant and cooperative, no obvious depression or anxiety  ASSESSMENT AND PLAN:  Discussed the following assessment and plan:  Acute upper respiratory infection  -given HPI and exam findings today, a serious infection or illness is unlikely. We discussed potential etiologies, with VURI being most likely, and advised supportive care and monitoring. Tessalon for cough. Short course nasal decongestant. We discussed treatment side effects, likely course, antibiotic misuse, transmission, and signs of developing a serious illness. -of course, we advised to return or notify a doctor immediately if symptoms worsen or persist or new concerns arise.    Patient Instructions  INSTRUCTIONS FOR UPPER RESPIRATORY INFECTION:  -nasal saline wash 2-3 times daily (use prepackaged nasal saline or bottled/distilled water if making your own)   -AFRIN nasal spray for drainage and nasal congestion for 4 days - but do NOT use longer then 4 days  -can use tylenol  (in no history of liver disease) or ibuprofen (if no history of kidney disease, bowel bleeding or significant heart disease) as directed for aches and sorethroat  -in the winter time, using a humidifier at night is helpful (please follow cleaning instructions)  -if you are taking a cough medication - use only as directed, may also try a teaspoon of honey to coat the throat and throat lozenges.  -for sore throat, salt water gargles can help  -follow up if you have fevers, facial pain, tooth pain, difficulty breathing or are worsening or symptoms persist longer then expected  Upper Respiratory Infection, Adult An upper respiratory infection (URI) is also known as the common cold. It is often caused by a type of germ (virus). Colds are easily spread (contagious). You can pass it to others by kissing, coughing, sneezing, or drinking out of  the same glass. Usually, you get better in 1 to 3  weeks.  However, the cough can last for even longer. HOME CARE   Only take medicine as told by your doctor. Follow instructions provided above.  Drink enough water and fluids to keep your pee (urine) clear or pale yellow.  Get plenty of rest.  Return to work when your temperature is < 100 for 24 hours or as told by your doctor. You may use a face mask and wash your hands to stop your cold from spreading. GET HELP RIGHT AWAY IF:   After the first few days, you feel you are getting worse.  You have questions about your medicine.  You have chills, shortness of breath, or red spit (mucus).  You have pain in the face for more then 1-2 days, especially when you bend forward.  You have a fever, puffy (swollen) neck, pain when you swallow, or white spots in the back of your throat.  You have a bad headache, ear pain, sinus pain, or chest pain.  You have a high-pitched whistling sound when you breathe in and out (wheezing).  You cough up blood.  You have sore muscles or a stiff neck. MAKE SURE YOU:    Understand these instructions.  Will watch your condition.  Will get help right away if you are not doing well or get worse. Document Released: 01/30/2008 Document Revised: 11/05/2011 Document Reviewed: 11/18/2013 [facility] Patient Information 2015 [place], [place]. This information is not intended to replace advice given to you by your health care provider. Make sure you discuss any questions you have with your health care provider.    Colin Benton R., DO

## 2016-07-10 ENCOUNTER — Telehealth: Payer: Self-pay | Admitting: Family Medicine

## 2016-07-10 NOTE — Telephone Encounter (Signed)
Please help her to schedule appt for re-eval - could have developed another type of infection.

## 2016-07-10 NOTE — Telephone Encounter (Signed)
Pt state that she is not better and would like to have something to relieve the pressure that is in her head and chest and to suppress her cough   Pharm:  [facility]

## 2016-07-10 NOTE — Telephone Encounter (Signed)
I called the pt and informed her of the message below.  I offered an appt for tomorrow with another provider and she stated tomorrow or Thursday would not be good for her due to a death in the family and an appt was scheduled for Friday at 10:15am.

## 2016-07-13 ENCOUNTER — Encounter: Payer: Self-pay | Admitting: Family Medicine

## 2016-07-13 ENCOUNTER — Ambulatory Visit (INDEPENDENT_AMBULATORY_CARE_PROVIDER_SITE_OTHER): Payer: PPO | Admitting: Family Medicine

## 2016-07-13 VITALS — BP 118/70 | HR 90 | Temp 98.4°F | Ht 66.0 in

## 2016-07-13 DIAGNOSIS — J0100 Acute maxillary sinusitis, unspecified: Secondary | ICD-10-CM | POA: Diagnosis not present

## 2016-07-13 MED ORDER — BENZONATATE 100 MG PO CAPS: 100.0000 mg | ORAL_CAPSULE | Freq: Two times a day (BID) | ORAL | 0 refills | Status: DC | PRN

## 2016-07-13 MED ORDER — AMOXICILLIN-POT CLAVULANATE 875-125 MG PO TABS: 1.0000 | ORAL_TABLET | Freq: Two times a day (BID) | ORAL | 0 refills | Status: DC

## 2016-07-13 NOTE — Progress Notes (Signed)
HPI:  Sinus congestion: -started: 3 weeks ago -symptoms:nasal congestion, sore throat, cough - now with thick white nasal congestion and paranasal, maxillary sinus pain -denies:fever, SOB, NVD -has tried: tessalon - helps cough -sick contacts/travel/risks: no reported flu, strep or tick exposure  ROS: See pertinent positives and negatives per HPI.  Past Medical History:  Diagnosis Date  . Benign paroxysmal positional vertigo 06/25/2013   dx with prior PCP, s/p labs, mri and vestibular rehab; nasal spray for etd seemed to help  . Blood in stool 03/11/2012  . Dependent edema    Bilateral  . Depression   . GERD (gastroesophageal reflux disease)   . Hyperlipidemia   . Low back pain    sciatica; sees orthopedic specialist ([facility])for this and shoulder tendinitis  . Obesity, diabetes, and hypertension syndrome ([place])   . Osteoarthritis, knee    Bilateral, s/p 3 total knee replacement surgerues on right, last 2005. Dr. Tonita Cong  . Pellegrini-Steida syndrome    Myositis ossificans  . Seasonal allergies   . Shingles Jul 10 2012   LESIONS CLEARED BUT NOT TOLERATING ANY THING TIGHT AGAINST BODY--SHINGLES WERE AROSS BACK AND ABDOMEN    Past Surgical History:  Procedure Laterality Date  . ABDOMINAL HYSTERECTOMY  1984  . COLONOSCOPY    . left shoulder steroid injection    . TONSILLECTOMY  1977  . TOTAL HIP ARTHROPLASTY Right 10/10/2012   Procedure: R TOTAL HIP ARTHROPLASTY ANTERIOR APPROACH;  Surgeon: Mcarthur Rossetti, MD;  Location: [facility];  Service: Orthopedics;  Laterality: Right;  Right Total Hip Arthroplasty, Anterior Approach (C-Arm)  . TOTAL KNEE ARTHROPLASTY Right    with multiple revisions, 2003, 2005  . TOTAL KNEE ARTHROPLASTY Left 01/09/2013   Procedure: LEFT TOTAL KNEE ARTHROPLASTY;  Surgeon: Mcarthur Rossetti, MD;  Location: [facility];  Service: Orthopedics;  Laterality: Left;  . TUBAL LIGATION  1978  . WISDOM TOOTH EXTRACTION  2013,2014    Family History    Problem Relation Age of Onset  . Diabetes Mother   . Hypertension Mother   . Hypertension Father   . Cancer Father     Lung CA  . Coronary artery disease Father   . Colon cancer Neg Hx     Social History   Social History  . Marital status: Married    Spouse name: N/A  . Number of children: N/A  . Years of education: 12th grade   Occupational History  .  [employer]   Social History Main Topics  . Smoking status: Never Smoker  . Smokeless tobacco: Never Used  . Alcohol use No  . Drug use: No  . Sexual activity: Not Asked   Other Topics Concern  . None   Social History Narrative   Married, 3 daughters.   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   Nicotine: never   ETOH: no   Drugs: no     Current Outpatient Prescriptions:  .  aspirin 81 MG tablet, Take 81 mg by mouth daily., Disp: , Rfl:  .  benzonatate (TESSALON) 100 MG capsule, Take 1 capsule (100 mg total) by mouth 2 (two) times daily as needed for cough., Disp: 20 capsule, Rfl: 0 .  Blood Glucose Monitoring Suppl (ONETOUCH VERIO IQ SYSTEM) W/DEVICE KIT, by Does not apply route., Disp: , Rfl:  .  cetirizine (ZYRTEC) 10 MG tablet, Take 10 mg by mouth at bedtime., Disp: , Rfl:  .  fluticasone (FLONASE) 50 MCG/ACT nasal  spray, SHAKE LIQUID AND USE 2 SPRAYS IN EACH NOSTRIL DAILY, Disp: 16 g, Rfl: 2 .  furosemide (LASIX) 40 MG tablet, Take 0.5 tablets (20 mg total) by mouth daily., Disp: 30 tablet, Rfl: 1 .  gabapentin (NEURONTIN) 300 MG capsule, TAKE ONE CAPSULE BY MOUTH AT BEDTIME, Disp: 90 capsule, Rfl: 1 .  glucose blood test strip, Use as directed to check blood sugar once a day, Disp: 100 each, Rfl: 3 .  lisinopril (PRINIVIL,ZESTRIL) 20 MG tablet, TAKE 1 TABLET BY MOUTH EVERY DAY, Disp: 90 tablet, Rfl: 2 .  metFORMIN (GLUCOPHAGE) 1000 MG tablet, TAKE 1 TABLET BY MOUTH TWICE DAILY WITH A MEAL, Disp: 180 tablet, Rfl: 1 .  omeprazole (PRILOSEC) 10 MG capsule, Take 10 mg by  mouth daily as needed., Disp: , Rfl:  .  ONETOUCH VERIO test strip, USE AS DIRECTED TO TEST BLOOD SUGAR ONCE DAILY, Disp: 100 each, Rfl: 2 .  pravastatin (PRAVACHOL) 40 MG tablet, TAKE 1 AND 1/2 TABLETS(60 MG) BY MOUTH DAILY, Disp: 135 tablet, Rfl: 2 .  amoxicillin-clavulanate (AUGMENTIN) 875-125 MG tablet, Take 1 tablet by mouth 2 (two) times daily., Disp: 20 tablet, Rfl: 0  EXAM:  Vitals:   07/13/16 1005  BP: 118/70  Pulse: 90  Temp: 98.4 F (36.9 C)    There is no height or weight on file to calculate BMI.  GENERAL: vitals reviewed and listed above, alert, oriented, appears well hydrated and in no acute distress  HEENT: atraumatic, conjunttiva clear, no obvious abnormalities on inspection of external nose and ears, normal appearance of ear canals and TMs except for bilat nonpurulent effusions, thick nasal congestion, mild post oropharyngeal erythema with PND, no tonsillar edema or exudate, R max sinus TTP  NECK: no obvious masses on inspection  LUNGS: clear to auscultation bilaterally, no wheezes, rales or rhonchi, good air movement  CV: HRRR, no peripheral edema  MS: moves all extremities without noticeable abnormality  PSYCH: pleasant and cooperative, no obvious depression or anxiety  ASSESSMENT AND PLAN:  Discussed the following assessment and plan:  Acute non-recurrent maxillary sinusitis   We discussed potential etiologies, sinusitis most likely, and opted to treat with an antibiotic. We discussed treatment side effects, likely course, antibiotic misuse, transmission, and signs of developing a serious illness. -of course, we advised to return or notify a doctor immediately if symptoms worsen or persist or new concerns arise.    Patient Instructions  Please take the medications as prescribed. Follow up if worsening or persistent symptoms.   Sinusitis, Adult Sinusitis is soreness and inflammation of your sinuses. Sinuses are hollow spaces in the bones around your  face. Your sinuses are located:  Around your eyes.  In the middle of your forehead.  Behind your nose.  In your cheekbones. Your sinuses and nasal passages are lined with a stringy fluid (mucus). Mucus normally drains out of your sinuses. When your nasal tissues become inflamed or swollen, the mucus can become trapped or blocked so air cannot flow through your sinuses. This allows bacteria, viruses, and funguses to grow, which leads to infection. Sinusitis can develop quickly and last for 7?10 days (acute) or for more than 12 weeks (chronic). Sinusitis often develops after a cold. What are the causes? This condition is caused by anything that creates swelling in the sinuses or stops mucus from draining, including:  Allergies.  Asthma.  Bacterial or viral infection.  Abnormally shaped bones between the nasal passages.  Nasal growths that contain mucus (nasal polyps).  Narrow  sinus openings.  Pollutants, such as chemicals or irritants in the air.  A foreign object stuck in the nose.  A fungal infection. This is rare. What increases the risk? The following factors may make you more likely to develop this condition:  Having allergies or asthma.  Having had a recent cold or respiratory tract infection.  Having structural deformities or blockages in your nose or sinuses.  Having a weak immune system.  Doing a lot of swimming or diving.  Overusing nasal sprays.  Smoking. What are the signs or symptoms? The main symptoms of this condition are pain and a feeling of pressure around the affected sinuses. Other symptoms include:  Upper toothache.  Earache.  Headache.  Bad breath.  Decreased sense of smell and taste.  A cough that may get worse at night.  Fatigue.  Fever.  Thick drainage from your nose. The drainage is often green and it may contain pus (purulent).  Stuffy nose or congestion.  Postnasal drip. This is when extra mucus collects in the throat or  back of the nose.  Swelling and warmth over the affected sinuses.  Sore throat.  Sensitivity to light. How is this diagnosed? This condition is diagnosed based on symptoms, a medical history, and a physical exam. To find out if your condition is acute or chronic, your health care provider may:  Look in your nose for signs of nasal polyps.  Tap over the affected sinus to check for signs of infection.  View the inside of your sinuses using an imaging device that has a light attached (endoscope). If your health care provider suspects that you have chronic sinusitis, you may also:  Be tested for allergies.  Have a sample of mucus taken from your nose (nasal culture) and checked for bacteria.  Have a mucus sample examined to see if your sinusitis is related to an allergy. If your sinusitis does not respond to treatment and it lasts longer than 8 weeks, you may have an MRI or CT scan to check your sinuses. These scans also help to determine how severe your infection is. In rare cases, a bone biopsy may be done to rule out more serious types of fungal sinus disease. How is this treated? Treatment for sinusitis depends on the cause and whether your condition is chronic or acute. If a virus is causing your sinusitis, your symptoms will go away on their own within 10 days. You may be given medicines to relieve your symptoms, including:  Topical nasal decongestants. They shrink swollen nasal passages and let mucus drain from your sinuses.  Antihistamines. These drugs block inflammation that is triggered by allergies. This can help to ease swelling in your nose and sinuses.  Topical nasal corticosteroids. These are nasal sprays that ease inflammation and swelling in your nose and sinuses.  Nasal saline washes. These rinses can help to get rid of thick mucus in your nose. If your condition is caused by bacteria, you will be given an antibiotic medicine. If your condition is caused by a fungus,  you will be given an antifungal medicine. Surgery may be needed to correct underlying conditions, such as narrow nasal passages. Surgery may also be needed to remove polyps. Follow these instructions at home: Medicines  Take, use, or apply over-the-counter and prescription medicines only as told by your health care provider. These may include nasal sprays.  If you were prescribed an antibiotic medicine, take it as told by your health care provider. Do not stop taking  the antibiotic even if you start to feel better. Hydrate and Humidify  Drink enough water to keep your urine clear or pale yellow. Staying hydrated will help to thin your mucus.  Use a cool mist humidifier to keep the humidity level in your home above 50%.  Inhale steam for 10-15 minutes, 3-4 times a day or as told by your health care provider. You can do this in the bathroom while a hot shower is running.  Limit your exposure to cool or dry air. Rest  Rest as much as possible.  Sleep with your head raised (elevated).  Make sure to get enough sleep each night. General instructions  Apply a warm, moist washcloth to your face 3-4 times a day or as told by your health care provider. This will help with discomfort.  Wash your hands often with soap and water to reduce your exposure to viruses and other germs. If soap and water are not available, use hand sanitizer.  Do not smoke. Avoid being around people who are smoking (secondhand smoke).  Keep all follow-up visits as told by your health care provider. This is important. Contact a health care provider if:  You have a fever.  Your symptoms get worse.  Your symptoms do not improve within 10 days. Get help right away if:  You have a severe headache.  You have persistent vomiting.  You have pain or swelling around your face or eyes.  You have vision problems.  You develop confusion.  Your neck is stiff.  You have trouble breathing. This information is not  intended to replace advice given to you by your health care provider. Make sure you discuss any questions you have with your health care provider. Document Released: 08/13/2005 Document Revised: 04/08/2016 Document Reviewed: 06/08/2015 Elsevier Interactive Patient Education  2017 [place]

## 2016-07-13 NOTE — Patient Instructions (Signed)
Please take the medications as prescribed. Follow up if worsening or persistent symptoms.   Sinusitis, Adult Sinusitis is soreness and inflammation of your sinuses. Sinuses are hollow spaces in the bones around your face. Your sinuses are located:  Around your eyes.  In the middle of your forehead.  Behind your nose.  In your cheekbones. Your sinuses and nasal passages are lined with a stringy fluid (mucus). Mucus normally drains out of your sinuses. When your nasal tissues become inflamed or swollen, the mucus can become trapped or blocked so air cannot flow through your sinuses. This allows bacteria, viruses, and funguses to grow, which leads to infection. Sinusitis can develop quickly and last for 7?10 days (acute) or for more than 12 weeks (chronic). Sinusitis often develops after a cold. What are the causes? This condition is caused by anything that creates swelling in the sinuses or stops mucus from draining, including:  Allergies.  Asthma.  Bacterial or viral infection.  Abnormally shaped bones between the nasal passages.  Nasal growths that contain mucus (nasal polyps).  Narrow sinus openings.  Pollutants, such as chemicals or irritants in the air.  A foreign object stuck in the nose.  A fungal infection. This is rare. What increases the risk? The following factors may make you more likely to develop this condition:  Having allergies or asthma.  Having had a recent cold or respiratory tract infection.  Having structural deformities or blockages in your nose or sinuses.  Having a weak immune system.  Doing a lot of swimming or diving.  Overusing nasal sprays.  Smoking. What are the signs or symptoms? The main symptoms of this condition are pain and a feeling of pressure around the affected sinuses. Other symptoms include:  Upper toothache.  Earache.  Headache.  Bad breath.  Decreased sense of smell and taste.  A cough that may get worse at  night.  Fatigue.  Fever.  Thick drainage from your nose. The drainage is often green and it may contain pus (purulent).  Stuffy nose or congestion.  Postnasal drip. This is when extra mucus collects in the throat or back of the nose.  Swelling and warmth over the affected sinuses.  Sore throat.  Sensitivity to light. How is this diagnosed? This condition is diagnosed based on symptoms, a medical history, and a physical exam. To find out if your condition is acute or chronic, your health care provider may:  Look in your nose for signs of nasal polyps.  Tap over the affected sinus to check for signs of infection.  View the inside of your sinuses using an imaging device that has a light attached (endoscope). If your health care provider suspects that you have chronic sinusitis, you may also:  Be tested for allergies.  Have a sample of mucus taken from your nose (nasal culture) and checked for bacteria.  Have a mucus sample examined to see if your sinusitis is related to an allergy. If your sinusitis does not respond to treatment and it lasts longer than 8 weeks, you may have an MRI or CT scan to check your sinuses. These scans also help to determine how severe your infection is. In rare cases, a bone biopsy may be done to rule out more serious types of fungal sinus disease. How is this treated? Treatment for sinusitis depends on the cause and whether your condition is chronic or acute. If a virus is causing your sinusitis, your symptoms will go away on their own within 10 days.  You may be given medicines to relieve your symptoms, including:  Topical nasal decongestants. They shrink swollen nasal passages and let mucus drain from your sinuses.  Antihistamines. These drugs block inflammation that is triggered by allergies. This can help to ease swelling in your nose and sinuses.  Topical nasal corticosteroids. These are nasal sprays that ease inflammation and swelling in your nose  and sinuses.  Nasal saline washes. These rinses can help to get rid of thick mucus in your nose. If your condition is caused by bacteria, you will be given an antibiotic medicine. If your condition is caused by a fungus, you will be given an antifungal medicine. Surgery may be needed to correct underlying conditions, such as narrow nasal passages. Surgery may also be needed to remove polyps. Follow these instructions at home: Medicines  Take, use, or apply over-the-counter and prescription medicines only as told by your health care provider. These may include nasal sprays.  If you were prescribed an antibiotic medicine, take it as told by your health care provider. Do not stop taking the antibiotic even if you start to feel better. Hydrate and Humidify  Drink enough water to keep your urine clear or pale yellow. Staying hydrated will help to thin your mucus.  Use a cool mist humidifier to keep the humidity level in your home above 50%.  Inhale steam for 10-15 minutes, 3-4 times a day or as told by your health care provider. You can do this in the bathroom while a hot shower is running.  Limit your exposure to cool or dry air. Rest  Rest as much as possible.  Sleep with your head raised (elevated).  Make sure to get enough sleep each night. General instructions  Apply a warm, moist washcloth to your face 3-4 times a day or as told by your health care provider. This will help with discomfort.  Wash your hands often with soap and water to reduce your exposure to viruses and other germs. If soap and water are not available, use hand sanitizer.  Do not smoke. Avoid being around people who are smoking (secondhand smoke).  Keep all follow-up visits as told by your health care provider. This is important. Contact a health care provider if:  You have a fever.  Your symptoms get worse.  Your symptoms do not improve within 10 days. Get help right away if:  You have a severe  headache.  You have persistent vomiting.  You have pain or swelling around your face or eyes.  You have vision problems.  You develop confusion.  Your neck is stiff.  You have trouble breathing. This information is not intended to replace advice given to you by your health care provider. Make sure you discuss any questions you have with your health care provider. Document Released: 08/13/2005 Document Revised: 04/08/2016 Document Reviewed: 06/08/2015 Elsevier Interactive Patient Education  2017 [employer].

## 2016-07-13 NOTE — Progress Notes (Signed)
Pre visit review using our clinic review tool, if applicable. No additional management support is needed unless otherwise documented below in the visit note. 

## 2016-07-26 DIAGNOSIS — E119 Type 2 diabetes mellitus without complications: Secondary | ICD-10-CM | POA: Diagnosis not present

## 2016-07-26 DIAGNOSIS — H25093 Other age-related incipient cataract, bilateral: Secondary | ICD-10-CM | POA: Diagnosis not present

## 2016-07-26 LAB — HM DIABETES EYE EXAM

## 2016-07-31 ENCOUNTER — Encounter: Payer: Self-pay | Admitting: Family Medicine

## 2016-08-10 ENCOUNTER — Other Ambulatory Visit: Payer: Self-pay | Admitting: Family Medicine

## 2016-08-10 NOTE — Telephone Encounter (Signed)
Is it okay to refill?

## 2016-09-05 NOTE — Progress Notes (Signed)
HPI:  Renee Watts is a pleasant 66 yo with a PMH significant for DM, HLD, Obesity, HTN, Edema, GERD and anemia here for follow up. Reports doing well for the most part. He struggled with a respiratory illness number, which is resolved. She reports she is getting some exercise, but her diet has been poor recently. She has been helping to care for several sick family members. Mood has been good despite this. Denies hypoglycemia, vision changes, chest pain, shortness of breath. Due for labs, shingles vaccine? She opted to hold off on the shingles vaccine, isn't considering the new vaccine.  DM:  -complications: neuropathy, microvascular disease -meds: metformin '1000mg'$  bid, asa, acei   HLD/Obesity:  -meds: pravastatin   HTN/LE edema:  -meds: lisinopril, lasix   GERD:  -meds:omeprazole   Anemia: -chronic, intermittent -last colon 10/2013 with small benign cecal ulcers, several polyps - repeat colon advised in 5 yers -b12 ok in the past, ferritin ok last check  ROS: See pertinent positives and negatives per HPI.  Past Medical History:  Diagnosis Date  . Benign paroxysmal positional vertigo 06/25/2013   dx with prior PCP, s/p labs, mri and vestibular rehab; nasal spray for etd seemed to help  . Blood in stool 03/11/2012  . Dependent edema    Bilateral  . Depression   . GERD (gastroesophageal reflux disease)   . Hyperlipidemia   . Low back pain    sciatica; sees orthopedic specialist ([facility])for this and shoulder tendinitis  . Obesity, diabetes, and hypertension syndrome ([place])   . Osteoarthritis, knee    Bilateral, s/p 3 total knee replacement surgerues on right, last 2005. Dr. Tonita Cong  . Pellegrini-Steida syndrome    Myositis ossificans  . Seasonal allergies   . Shingles Jul 10 2012   LESIONS CLEARED BUT NOT TOLERATING ANY THING TIGHT AGAINST BODY--SHINGLES WERE AROSS BACK AND ABDOMEN    Past Surgical History:  Procedure Laterality Date  . ABDOMINAL  HYSTERECTOMY  1984  . COLONOSCOPY    . left shoulder steroid injection    . TONSILLECTOMY  1977  . TOTAL HIP ARTHROPLASTY Right 10/10/2012   Procedure: R TOTAL HIP ARTHROPLASTY ANTERIOR APPROACH;  Surgeon: Mcarthur Rossetti, MD;  Location: [facility];  Service: Orthopedics;  Laterality: Right;  Right Total Hip Arthroplasty, Anterior Approach (C-Arm)  . TOTAL KNEE ARTHROPLASTY Right    with multiple revisions, 2003, 2005  . TOTAL KNEE ARTHROPLASTY Left 01/09/2013   Procedure: LEFT TOTAL KNEE ARTHROPLASTY;  Surgeon: Mcarthur Rossetti, MD;  Location: [facility];  Service: Orthopedics;  Laterality: Left;  . TUBAL LIGATION  1978  . WISDOM TOOTH EXTRACTION  2013,2014    Family History  Problem Relation Age of Onset  . Diabetes Mother   . Hypertension Mother   . Hypertension Father   . Cancer Father     Lung CA  . Coronary artery disease Father   . Colon cancer Neg Hx     Social History   Social History  . Marital status: Married    Spouse name: N/A  . Number of children: N/A  . Years of education: 12th grade   Occupational History  .  [employer]   Social History Main Topics  . Smoking status: Never Smoker  . Smokeless tobacco: Never Used  . Alcohol use No  . Drug use: No  . Sexual activity: Not Asked   Other Topics Concern  . None   Social History Narrative   Married, 3 daughters.   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  aspirin 81 MG tablet, Take 81 mg by mouth daily., Disp: , Rfl:  .  Blood Glucose Monitoring Suppl (ONETOUCH VERIO IQ SYSTEM) W/DEVICE KIT, by Does not apply route., Disp: , Rfl:  .  cetirizine (ZYRTEC) 10 MG tablet, Take 10 mg by mouth at bedtime., Disp: , Rfl:  .  fluticasone (FLONASE) 50 MCG/ACT nasal spray, SHAKE LIQUID AND USE 2 SPRAYS IN EACH NOSTRIL DAILY, Disp: 16 g, Rfl: 2 .  furosemide (LASIX) 40 MG tablet, Take 0.5  tablets (20 mg total) by mouth daily., Disp: 30 tablet, Rfl: 1 .  gabapentin (NEURONTIN) 300 MG capsule, TAKE ONE CAPSULE BY MOUTH AT BEDTIME, Disp: 90 capsule, Rfl: 0 .  glucose blood test strip, Use as directed to check blood sugar once a day, Disp: 100 each, Rfl: 3 .  lisinopril (PRINIVIL,ZESTRIL) 20 MG tablet, TAKE 1 TABLET BY MOUTH EVERY DAY, Disp: 90 tablet, Rfl: 2 .  metFORMIN (GLUCOPHAGE) 1000 MG tablet, TAKE 1 TABLET BY MOUTH TWICE DAILY WITH A MEAL, Disp: 180 tablet, Rfl: 1 .  omeprazole (PRILOSEC) 10 MG capsule, Take 10 mg by mouth daily as needed., Disp: , Rfl:  .  ONETOUCH VERIO test strip, USE AS DIRECTED TO TEST BLOOD SUGAR ONCE DAILY, Disp: 100 each, Rfl: 2 .  pravastatin (PRAVACHOL) 40 MG tablet, TAKE 1 AND 1/2 TABLETS(60 MG) BY MOUTH DAILY, Disp: 135 tablet, Rfl: 2  EXAM:  Vitals:   09/06/16 0908  BP: 118/70  Pulse: (!) 59  Temp: 98 F (36.7 C)    Body mass index is 30.25 kg/m.  GENERAL: vitals reviewed and listed above, alert, oriented, appears well hydrated and in no acute distress  HEENT: atraumatic, conjunttiva clear, no obvious abnormalities on inspection of external nose and ears  NECK: no obvious masses on inspection  LUNGS: clear to auscultation bilaterally, no wheezes, rales or rhonchi, good air movement  CV: HRRR, no peripheral edema  MS: moves all extremities without noticeable abnormality  PSYCH: pleasant and cooperative, no obvious depression or anxiety  ASSESSMENT AND PLAN:  Discussed the following assessment and plan:  Type II diabetes mellitus with neurological manifestations (HCC) - Plan: Hemoglobin A1c  Hyperlipidemia, unspecified hyperlipidemia type  Essential hypertension - Plan: CBC (no diff), Basic metabolic panel  BMI 85.2-77.8,EUMPN  Diabetic peripheral neuropathy ([place])  -Patient advised to return or notify a doctor immediately if symptoms worsen or persist or new concerns arise.  Patient Instructions  BEFORE YOU  LEAVE: -labs -follow up: 1) Medicare annual wellness visit in next 1-2 months with susan. 2) follow up with Dr. Maudie Mercury in 3-4 months  We have ordered labs or studies at this visit. It can take up to 1-2 weeks for results and processing. IF results require follow up or explanation, we will call you with instructions. Clinically stable results will be released to your [facility]. If you have not heard from [place] or cannot find your results in [facility] in 2 weeks please contact our office at [phone].  If you are not yet signed up for [facility], please consider signing up.   We recommend the following healthy lifestyle for LIFE: 1) Small portions.   Tip: eat off of a salad plate instead of a dinner plate.  Tip: It is ok to feel hungry after a meal of proper portion sizes  Tip: if you need more or a snack choose fruits, veggies and/or a handful of nuts or seeds.  2) Eat a healthy clean diet.  * Tip: Avoid (less then 1 serving per week): processed foods, sweets, sweetened drinks, white starches (rice, flour, bread, potatoes, pasta, etc), red meat, fast foods, butter  *Tip: CHOOSE instead   * 5-9 servings per day of fresh or frozen fruits and vegetables (but not corn, potatoes, bananas, canned or dried fruit)   *nuts and seeds, beans   *olives and olive oil   *small portions of lean meats such as fish and white chicken    *small portions of whole grains  3)Get at least 150 minutes of sweaty aerobic exercise per week.  4)Reduce stress - consider counseling, meditation and relaxation to balance other aspects of your life.            Colin Benton R., DO

## 2016-09-06 ENCOUNTER — Encounter: Payer: Self-pay | Admitting: Family Medicine

## 2016-09-06 ENCOUNTER — Ambulatory Visit (INDEPENDENT_AMBULATORY_CARE_PROVIDER_SITE_OTHER): Payer: PPO | Admitting: Family Medicine

## 2016-09-06 VITALS — BP 118/70 | HR 59 | Temp 98.0°F | Ht 66.0 in | Wt 187.4 lb

## 2016-09-06 DIAGNOSIS — Z6830 Body mass index (BMI) 30.0-30.9, adult: Secondary | ICD-10-CM | POA: Diagnosis not present

## 2016-09-06 DIAGNOSIS — E1149 Type 2 diabetes mellitus with other diabetic neurological complication: Secondary | ICD-10-CM | POA: Diagnosis not present

## 2016-09-06 DIAGNOSIS — E785 Hyperlipidemia, unspecified: Secondary | ICD-10-CM

## 2016-09-06 DIAGNOSIS — E1142 Type 2 diabetes mellitus with diabetic polyneuropathy: Secondary | ICD-10-CM

## 2016-09-06 DIAGNOSIS — I10 Essential (primary) hypertension: Secondary | ICD-10-CM

## 2016-09-06 LAB — BASIC METABOLIC PANEL
BUN: 16 mg/dL (ref 6–23)
CHLORIDE: 100 meq/L (ref 96–112)
CO2: 32 meq/L (ref 19–32)
CREATININE: 0.53 mg/dL (ref 0.40–1.20)
Calcium: 10.1 mg/dL (ref 8.4–10.5)
GFR: 148.21 mL/min (ref >60.00)
GLUCOSE: 146 mg/dL — AB (ref 70–99)
Potassium: 4.2 mEq/L (ref 3.5–5.1)
Sodium: 139 mEq/L (ref 135–145)

## 2016-09-06 LAB — CBC
HCT: 38.8 % (ref 36.0–46.0)
HEMOGLOBIN: 12.9 g/dL (ref 12.0–15.0)
MCHC: 33.3 g/dL (ref 30.0–36.0)
MCV: 88.4 fl (ref 78.0–100.0)
PLATELETS: 179 10*3/uL (ref 150.0–400.0)
RBC: 4.39 Mil/uL (ref 3.87–5.11)
RDW: 14.4 % (ref 11.5–15.5)
WBC: 7.9 10*3/uL (ref 4.0–10.5)

## 2016-09-06 LAB — HEMOGLOBIN A1C: Hgb A1c MFr Bld: 6.9 % — ABNORMAL HIGH (ref 4.6–6.5)

## 2016-09-06 NOTE — Progress Notes (Signed)
Pre visit review using our clinic review tool, if applicable. No additional management support is needed unless otherwise documented below in the visit note. 

## 2016-09-06 NOTE — Patient Instructions (Signed)
BEFORE YOU LEAVE: -labs -follow up: 1) Medicare annual wellness visit in next 1-2 months with susan. 2) follow up with Dr. Maudie Mercury in 3-4 months  We have ordered labs or studies at this visit. It can take up to 1-2 weeks for results and processing. IF results require follow up or explanation, we will call you with instructions. Clinically stable results will be released to your [facility]. If you have not heard from [place] or cannot find your results in [facility] in 2 weeks please contact our office at [phone].  If you are not yet signed up for [facility], please consider signing up.   We recommend the following healthy lifestyle for LIFE: 1) Small portions.   Tip: eat off of a salad plate instead of a dinner plate.  Tip: It is ok to feel hungry after a meal of proper portion sizes  Tip: if you need more or a snack choose fruits, veggies and/or a handful of nuts or seeds.  2) Eat a healthy clean diet.  * Tip: Avoid (less then 1 serving per week): processed foods, sweets, sweetened drinks, white starches (rice, flour, bread, potatoes, pasta, etc), red meat, fast foods, butter  *Tip: CHOOSE instead   * 5-9 servings per day of fresh or frozen fruits and vegetables (but not corn, potatoes, bananas, canned or dried fruit)   *nuts and seeds, beans   *olives and olive oil   *small portions of lean meats such as fish and white chicken    *small portions of whole grains  3)Get at least 150 minutes of sweaty aerobic exercise per week.  4)Reduce stress - consider counseling, meditation and relaxation to balance other aspects of your life.

## 2016-09-14 ENCOUNTER — Other Ambulatory Visit: Payer: Self-pay | Admitting: Family Medicine

## 2016-10-02 ENCOUNTER — Encounter: Payer: Self-pay | Admitting: Family Medicine

## 2016-10-02 ENCOUNTER — Ambulatory Visit (INDEPENDENT_AMBULATORY_CARE_PROVIDER_SITE_OTHER): Payer: PPO | Admitting: Family Medicine

## 2016-10-02 VITALS — BP 138/80 | HR 69 | Temp 98.7°F | Ht 66.0 in | Wt 192.9 lb

## 2016-10-02 DIAGNOSIS — J069 Acute upper respiratory infection, unspecified: Secondary | ICD-10-CM

## 2016-10-02 DIAGNOSIS — B9789 Other viral agents as the cause of diseases classified elsewhere: Secondary | ICD-10-CM | POA: Diagnosis not present

## 2016-10-02 MED ORDER — BENZONATATE 100 MG PO CAPS: 100.0000 mg | ORAL_CAPSULE | Freq: Two times a day (BID) | ORAL | 0 refills | Status: DC | PRN

## 2016-10-02 NOTE — Progress Notes (Signed)
Pre visit review using our clinic review tool, if applicable. No additional management support is needed unless otherwise documented below in the visit note. 

## 2016-10-02 NOTE — Progress Notes (Signed)
HPI:  -started: 2 days ago -symptoms:nasal congestion, sore throat, cough, sinus pressure, ear pop -denies:fever, SOB, NVD, tooth pain, body aches -has tried: OTC nasal sprays -sick contacts/travel/risks: no reported flu, strep or tick exposure - but around kids with a cold  ROS: See pertinent positives and negatives per HPI.  Past Medical History:  Diagnosis Date  . Benign paroxysmal positional vertigo 06/25/2013   dx with prior PCP, s/p labs, mri and vestibular rehab; nasal spray for etd seemed to help  . Blood in stool 03/11/2012  . Dependent edema    Bilateral  . Depression   . GERD (gastroesophageal reflux disease)   . Hyperlipidemia   . Low back pain    sciatica; sees orthopedic specialist ([facility])for this and shoulder tendinitis  . Obesity, diabetes, and hypertension syndrome ([place])   . Osteoarthritis, knee    Bilateral, s/p 3 total knee replacement surgerues on right, last 2005. Dr. Tonita Cong  . Pellegrini-Steida syndrome    Myositis ossificans  . Seasonal allergies   . Shingles Jul 10 2012   LESIONS CLEARED BUT NOT TOLERATING ANY THING TIGHT AGAINST BODY--SHINGLES WERE AROSS BACK AND ABDOMEN    Past Surgical History:  Procedure Laterality Date  . ABDOMINAL HYSTERECTOMY  1984  . COLONOSCOPY    . left shoulder steroid injection    . TONSILLECTOMY  1977  . TOTAL HIP ARTHROPLASTY Right 10/10/2012   Procedure: R TOTAL HIP ARTHROPLASTY ANTERIOR APPROACH;  Surgeon: Mcarthur Rossetti, MD;  Location: [facility];  Service: Orthopedics;  Laterality: Right;  Right Total Hip Arthroplasty, Anterior Approach (C-Arm)  . TOTAL KNEE ARTHROPLASTY Right    with multiple revisions, 2003, 2005  . TOTAL KNEE ARTHROPLASTY Left 01/09/2013   Procedure: LEFT TOTAL KNEE ARTHROPLASTY;  Surgeon: Mcarthur Rossetti, MD;  Location: [facility];  Service: Orthopedics;  Laterality: Left;  . TUBAL LIGATION  1978  . WISDOM TOOTH EXTRACTION  2013,2014    Family History  Problem Relation Age  of Onset  . Diabetes Mother   . Hypertension Mother   . Hypertension Father   . Cancer Father     Lung CA  . Coronary artery disease Father   . Colon cancer Neg Hx     Social History   Social History  . Marital status: Married    Spouse name: N/A  . Number of children: N/A  . Years of education: 12th grade   Occupational History  .  [employer]   Social History Main Topics  . Smoking status: Never Smoker  . Smokeless tobacco: Never Used  . Alcohol use No  . Drug use: No  . Sexual activity: Not Asked   Other Topics Concern  . None   Social History Narrative   Married, 3 daughters.   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   Nicotine: never   ETOH: no   Drugs: no     Current Outpatient Prescriptions:  .  aspirin 81 MG tablet, Take 81 mg by mouth daily., Disp: , Rfl:  .  Blood Glucose Monitoring Suppl (ONETOUCH VERIO IQ SYSTEM) W/DEVICE KIT, by Does not apply route., Disp: , Rfl:  .  cetirizine (ZYRTEC) 10 MG tablet, Take 10 mg by mouth at bedtime., Disp: , Rfl:  .  fluticasone (FLONASE) 50 MCG/ACT nasal spray, SHAKE LIQUID AND USE 2 SPRAYS IN EACH NOSTRIL DAILY, Disp: 16 g, Rfl: 5 .  furosemide (LASIX) 40 MG tablet, Take 0.5 tablets (20 mg total)  by mouth daily., Disp: 30 tablet, Rfl: 1 .  gabapentin (NEURONTIN) 300 MG capsule, TAKE ONE CAPSULE BY MOUTH AT BEDTIME, Disp: 90 capsule, Rfl: 0 .  glucose blood test strip, Use as directed to check blood sugar once a day, Disp: 100 each, Rfl: 3 .  lisinopril (PRINIVIL,ZESTRIL) 20 MG tablet, TAKE 1 TABLET BY MOUTH EVERY DAY, Disp: 90 tablet, Rfl: 2 .  metFORMIN (GLUCOPHAGE) 1000 MG tablet, TAKE 1 TABLET BY MOUTH TWICE DAILY WITH A MEAL, Disp: 180 tablet, Rfl: 1 .  omeprazole (PRILOSEC) 10 MG capsule, Take 10 mg by mouth daily as needed., Disp: , Rfl:  .  ONETOUCH VERIO test strip, USE AS DIRECTED TO TEST BLOOD SUGAR ONCE DAILY, Disp: 100 each, Rfl: 2 .  pravastatin (PRAVACHOL) 40 MG  tablet, TAKE 1 AND 1/2 TABLETS(60 MG) BY MOUTH DAILY, Disp: 135 tablet, Rfl: 2 .  benzonatate (TESSALON) 100 MG capsule, Take 1 capsule (100 mg total) by mouth 2 (two) times daily as needed for cough., Disp: 20 capsule, Rfl: 0  EXAM:  Vitals:   10/02/16 1607  BP: 138/80  Pulse: 69  Temp: 98.7 F (37.1 C)    Body mass index is 31.13 kg/m.  GENERAL: vitals reviewed and listed above, alert, oriented, appears well hydrated and in no acute distress  HEENT: atraumatic, conjunttiva clear, no obvious abnormalities on inspection of external nose and ears, normal appearance of ear canals and TMs - clear effusion bilat, clear nasal congestion, mild post oropharyngeal erythema with PND, no tonsillar edema or exudate, no sinus TTP  NECK: no obvious masses on inspection  LUNGS: clear to auscultation bilaterally, no wheezes, rales or rhonchi, good air movement  CV: HRRR, no peripheral edema  MS: moves all extremities without noticeable abnormality  PSYCH: pleasant and cooperative, no obvious depression or anxiety  ASSESSMENT AND PLAN:  Discussed the following assessment and plan:  Upper respiratory infection, viral  -given HPI and exam findings today, a serious infection or illness is unlikely. We discussed potential etiologies, with VURI being most likely, and advised supportive care and monitoring. We discussed treatment side effects, likely course, antibiotic misuse, transmission, and signs of developing a serious illness. -of course, we advised to return or notify a doctor immediately if symptoms worsen or persist or new concerns arise.    Patient Instructions  INSTRUCTIONS FOR UPPER RESPIRATORY INFECTION:  -plenty of rest and fluids  -nasal saline wash 2-3 times daily (use prepackaged nasal saline or bottled/distilled water if making your own)   -can use AFRIN nasal spray for drainage and nasal congestion - but do NOT use longer then 3-4 days  -can use tylenol (in no history  of liver disease) or ibuprofen (if no history of kidney disease, bowel bleeding or significant heart disease) as directed for aches and sorethroat  -in the winter time, using a humidifier at night is helpful (please follow cleaning instructions)  -if you are taking a cough medication - use only as directed, may also try a teaspoon of honey to coat the throat and throat lozenges.  -for sore throat, salt water gargles can help  -follow up if you have fevers, facial pain, tooth pain, difficulty breathing or are worsening or symptoms persist longer then expected  Upper Respiratory Infection, Adult An upper respiratory infection (URI) is also known as the common cold. It is often caused by a type of germ (virus). Colds are easily spread (contagious). You can pass it to others by kissing, coughing, sneezing, or drinking out  of the same glass. Usually, you get better in 1 to 3  weeks.  However, the cough can last for even longer. HOME CARE   Only take medicine as told by your doctor. Follow instructions provided above.  Drink enough water and fluids to keep your pee (urine) clear or pale yellow.  Get plenty of rest.  Return to work when your temperature is < 100 for 24 hours or as told by your doctor. You may use a face mask and wash your hands to stop your cold from spreading. GET HELP RIGHT AWAY IF:   After the first few days, you feel you are getting worse.  You have questions about your medicine.  You have chills, shortness of breath, or red spit (mucus).  You have pain in the face for more then 1-2 days, especially when you bend forward.  You have a fever, puffy (swollen) neck, pain when you swallow, or white spots in the back of your throat.  You have a bad headache, ear pain, sinus pain, or chest pain.  You have a high-pitched whistling sound when you breathe in and out (wheezing).  You cough up blood.  You have sore muscles or a stiff neck. MAKE SURE YOU:   Understand these  instructions.  Will watch your condition.  Will get help right away if you are not doing well or get worse. Document Released: 01/30/2008 Document Revised: 11/05/2011 Document Reviewed: 11/18/2013 [facility] Patient Information 2015 [place], [place]. This information is not intended to replace advice given to you by your health care provider. Make sure you discuss any questions you have with your health care provider.    Colin Benton R., DO

## 2016-10-02 NOTE — Patient Instructions (Addendum)
INSTRUCTIONS FOR UPPER RESPIRATORY INFECTION:  -plenty of rest and fluids  -nasal saline wash 2-3 times daily (use prepackaged nasal saline or bottled/distilled water if making your own)   -can use AFRIN nasal spray for drainage and nasal congestion - but do NOT use longer then 3-4 days  -can use tylenol (in no history of liver disease) or ibuprofen (if no history of kidney disease, bowel bleeding or significant heart disease) as directed for aches and sorethroat  -in the winter time, using a humidifier at night is helpful (please follow cleaning instructions)  -if you are taking a cough medication - use only as directed, may also try a teaspoon of honey to coat the throat and throat lozenges.  -for sore throat, salt water gargles can help  -follow up if you have fevers, facial pain, tooth pain, difficulty breathing or are worsening or symptoms persist longer then expected  Upper Respiratory Infection, Adult An upper respiratory infection (URI) is also known as the common cold. It is often caused by a type of germ (virus). Colds are easily spread (contagious). You can pass it to others by kissing, coughing, sneezing, or drinking out of the same glass. Usually, you get better in 1 to 3  weeks.  However, the cough can last for even longer. HOME CARE   Only take medicine as told by your doctor. Follow instructions provided above.  Drink enough water and fluids to keep your pee (urine) clear or pale yellow.  Get plenty of rest.  Return to work when your temperature is < 100 for 24 hours or as told by your doctor. You may use a face mask and wash your hands to stop your cold from spreading. GET HELP RIGHT AWAY IF:   After the first few days, you feel you are getting worse.  You have questions about your medicine.  You have chills, shortness of breath, or red spit (mucus).  You have pain in the face for more then 1-2 days, especially when you bend forward.  You have a fever, puffy  (swollen) neck, pain when you swallow, or white spots in the back of your throat.  You have a bad headache, ear pain, sinus pain, or chest pain.  You have a high-pitched whistling sound when you breathe in and out (wheezing).  You cough up blood.  You have sore muscles or a stiff neck. MAKE SURE YOU:   Understand these instructions.  Will watch your condition.  Will get help right away if you are not doing well or get worse. Document Released: 01/30/2008 Document Revised: 11/05/2011 Document Reviewed: 11/18/2013 [employer] Patient Information 2015 [employer]. This information is not intended to replace advice given to you by your health care provider. Make sure you discuss any questions you have with your health care provider.  

## 2016-10-10 ENCOUNTER — Telehealth: Payer: Self-pay

## 2016-10-10 NOTE — Telephone Encounter (Signed)
Call to Ms. Insley, She is not sure when she went on Medicare but states she was just seen with respiratory infection and would prefer to wait for AWV. Scheduled her with Dr. Maudie Mercury apt on 5/10 to see Dr. Maudie Mercury at [place] at 9:15

## 2016-10-11 ENCOUNTER — Ambulatory Visit: Payer: PPO

## 2016-11-05 ENCOUNTER — Ambulatory Visit (INDEPENDENT_AMBULATORY_CARE_PROVIDER_SITE_OTHER): Payer: PPO | Admitting: Family Medicine

## 2016-11-05 ENCOUNTER — Encounter: Payer: Self-pay | Admitting: Family Medicine

## 2016-11-05 VITALS — BP 122/62 | HR 78 | Temp 98.6°F | Ht 66.0 in | Wt 186.0 lb

## 2016-11-05 DIAGNOSIS — E1142 Type 2 diabetes mellitus with diabetic polyneuropathy: Secondary | ICD-10-CM | POA: Diagnosis not present

## 2016-11-05 DIAGNOSIS — B373 Candidiasis of vulva and vagina: Secondary | ICD-10-CM | POA: Diagnosis not present

## 2016-11-05 DIAGNOSIS — N8110 Cystocele, unspecified: Secondary | ICD-10-CM

## 2016-11-05 DIAGNOSIS — B3731 Acute candidiasis of vulva and vagina: Secondary | ICD-10-CM

## 2016-11-05 DIAGNOSIS — R600 Localized edema: Secondary | ICD-10-CM

## 2016-11-05 NOTE — Progress Notes (Signed)
Pre visit review using our clinic review tool, if applicable. No additional management support is needed unless otherwise documented below in the visit note. 

## 2016-11-05 NOTE — Progress Notes (Signed)
HPI:  Acute visit for several concerns. First of all had yeast infection and was treating with monostat and noticed bulge in vaginal area. Yeast infection now resolved with no dysuria, discharge, pruritis or any symptoms. Mild urinary frequency chronically. Also has neuropathy in feet and occ feels mild symptoms at night even with taking the Neurontin. Also has occ mild edema in ankles. Has compression socks - not using.  ROS: See pertinent positives and negatives per HPI.  Past Medical History:  Diagnosis Date  . Benign paroxysmal positional vertigo 06/25/2013   dx with prior PCP, s/p labs, mri and vestibular rehab; nasal spray for etd seemed to help  . Blood in stool 03/11/2012  . Dependent edema    Bilateral  . Depression   . GERD (gastroesophageal reflux disease)   . Hyperlipidemia   . Low back pain    sciatica; sees orthopedic specialist ([facility])for this and shoulder tendinitis  . Obesity, diabetes, and hypertension syndrome (HCC)   . Osteoarthritis, knee    Bilateral, s/p 3 total knee replacement surgerues on right, last 2005. Dr. Shelle Iron  . Pellegrini-Steida syndrome    Myositis ossificans  . Seasonal allergies   . Shingles Jul 10 2012   LESIONS CLEARED BUT NOT TOLERATING ANY THING TIGHT AGAINST BODY--SHINGLES WERE AROSS BACK AND ABDOMEN    Past Surgical History:  Procedure Laterality Date  . ABDOMINAL HYSTERECTOMY  1984  . COLONOSCOPY    . left shoulder steroid injection    . TONSILLECTOMY  1977  . TOTAL HIP ARTHROPLASTY Right 10/10/2012   Procedure: R TOTAL HIP ARTHROPLASTY ANTERIOR APPROACH;  Surgeon: Kathryne Hitch, MD;  Location: [facility];  Service: Orthopedics;  Laterality: Right;  Right Total Hip Arthroplasty, Anterior Approach (C-Arm)  . TOTAL KNEE ARTHROPLASTY Right    with multiple revisions, 2003, 2005  . TOTAL KNEE ARTHROPLASTY Left 01/09/2013   Procedure: LEFT TOTAL KNEE ARTHROPLASTY;  Surgeon: Kathryne Hitch, MD;  Location: [facility];   Service: Orthopedics;  Laterality: Left;  . TUBAL LIGATION  1978  . WISDOM TOOTH EXTRACTION  2013,2014    Family History  Problem Relation Age of Onset  . Diabetes Mother   . Hypertension Mother   . Hypertension Father   . Cancer Father     Lung CA  . Coronary artery disease Father   . Colon cancer Neg Hx     Social History   Social History  . Marital status: Married    Spouse name: N/A  . Number of children: N/A  . Years of education: 12th grade   Occupational History  .  [employer]   Social History Main Topics  . Smoking status: Never Smoker  . Smokeless tobacco: Never Used  . Alcohol use No  . Drug use: No  . Sexual activity: Not Asked   Other Topics Concern  . None   Social History Narrative   Married, 3 daughters.   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   Nicotine: never   ETOH: no   Drugs: no     Current Outpatient Prescriptions:  .  aspirin 81 MG tablet, Take 81 mg by mouth daily., Disp: , Rfl:  .  Blood Glucose Monitoring Suppl (ONETOUCH VERIO IQ SYSTEM) W/DEVICE KIT, by Does not apply route., Disp: , Rfl:  .  cetirizine (ZYRTEC) 10 MG tablet, Take 10 mg by mouth at bedtime., Disp: , Rfl:  .  fluticasone (FLONASE) 50 MCG/ACT nasal spray, SHAKE LIQUID  AND USE 2 SPRAYS IN EACH NOSTRIL DAILY, Disp: 16 g, Rfl: 5 .  furosemide (LASIX) 40 MG tablet, Take 0.5 tablets (20 mg total) by mouth daily., Disp: 30 tablet, Rfl: 1 .  gabapentin (NEURONTIN) 300 MG capsule, TAKE ONE CAPSULE BY MOUTH AT BEDTIME, Disp: 90 capsule, Rfl: 0 .  glucose blood test strip, Use as directed to check blood sugar once a day, Disp: 100 each, Rfl: 3 .  lisinopril (PRINIVIL,ZESTRIL) 20 MG tablet, TAKE 1 TABLET BY MOUTH EVERY DAY, Disp: 90 tablet, Rfl: 2 .  metFORMIN (GLUCOPHAGE) 1000 MG tablet, TAKE 1 TABLET BY MOUTH TWICE DAILY WITH A MEAL, Disp: 180 tablet, Rfl: 1 .  omeprazole (PRILOSEC) 10 MG capsule, Take 10 mg by mouth daily as needed.,  Disp: , Rfl:  .  ONETOUCH VERIO test strip, USE AS DIRECTED TO TEST BLOOD SUGAR ONCE DAILY, Disp: 100 each, Rfl: 2 .  pravastatin (PRAVACHOL) 40 MG tablet, TAKE 1 AND 1/2 TABLETS(60 MG) BY MOUTH DAILY, Disp: 135 tablet, Rfl: 2  EXAM:  Vitals:   11/05/16 1133  BP: 122/62  Pulse: 78  Temp: 98.6 F (37 C)    Body mass index is 30.02 kg/m.  GENERAL: vitals reviewed and listed above, alert, oriented, appears well hydrated and in no acute distress  HEENT: atraumatic, conjunttiva clear, no obvious abnormalities on inspection of external nose and ears  NECK: no obvious masses on inspection  LUNGS: clear to auscultation bilaterally, no wheezes, rales or rhonchi, good air movement  GU: normal vaginal mucosa, no abnormal discharge, bladder prolapse mild-mod.  CV: HRRR, tr bilat ankle edema  MS: moves all extremities without noticeable abnormality  PSYCH: pleasant and cooperative, no obvious depression or anxiety  ASSESSMENT AND PLAN:  Discussed the following assessment and plan:  Female bladder prolapse -she wants to see gyn about this to consider pessary and other options - she plans to call  Diabetic peripheral neuropathy ([place]) -discussed tx options, she may consider capsaicin first  Leg edema -minimal, compression  Yeast vaginitis -resolved  -Patient advised to return or notify a doctor immediately if symptoms worsen or persist or new concerns arise.  Patient Instructions  Keep follow up as scheduled  Do the exercises we discuss for the pelvic floor and see the gynecologist per your preference.  Can try capsaicin for the legs.  Compression socks for mild edema.   Colin Benton R., DO

## 2016-11-05 NOTE — Patient Instructions (Signed)
Keep follow up as scheduled  Do the exercises we discuss for the pelvic floor and see the gynecologist per your preference.  Can try capsaicin for the legs.  Compression socks for mild edema.

## 2016-11-06 ENCOUNTER — Telehealth: Payer: Self-pay | Admitting: *Deleted

## 2016-11-06 NOTE — Telephone Encounter (Signed)
Patient called and stated she has an appt with the OB/GYN Dr Deatra Ina on 3/29 and they asked that her notes be sent to their office.  I faxed the office notes from 3/12 to Dr Deatra Ina at [phone] and the pt is aware of this.

## 2016-11-13 ENCOUNTER — Other Ambulatory Visit: Payer: Self-pay | Admitting: Family Medicine

## 2016-11-22 DIAGNOSIS — N8110 Cystocele, unspecified: Secondary | ICD-10-CM | POA: Diagnosis not present

## 2016-12-19 ENCOUNTER — Other Ambulatory Visit: Payer: Self-pay | Admitting: Family Medicine

## 2016-12-19 DIAGNOSIS — Z1231 Encounter for screening mammogram for malignant neoplasm of breast: Secondary | ICD-10-CM

## 2017-01-01 NOTE — Progress Notes (Deleted)
HPI:  Renee Watts is a pleasant 66 y.o. here for follow up. Chronic medical problems summarized below were reviewed for changes and stability and were updated as needed below. These issues and their treatment remain stable for the most part. ***. Denies CP, SOB, DOE, treatment intolerance or new symptoms. She will be seeing Manuela Schwartz for her AWV today. Due for labs (b12, TSH, cbc, bmp, [id number]),   DM:  -complications: neuropathy, microvascular disease -meds: metformin 1067m bid, asa, acei, neurontin  HLD/Obesity:  -meds: pravastatin   HTN/LE edema:  -meds: lisinopril, lasix  -compression advised  GERD:  -meds:omeprazole   Anemia: -chronic, intermittent -last colon 10/2013 with small benign cecal ulcers, several polyps - repeat colon advised in 5 yers -b12 ok in the past, ferritin ok last check   ROS: See pertinent positives and negatives per HPI.  Past Medical History:  Diagnosis Date  . Benign paroxysmal positional vertigo 06/25/2013   dx with prior PCP, s/p labs, mri and vestibular rehab; nasal spray for etd seemed to help  . Blood in stool 03/11/2012  . Dependent edema    Bilateral  . Depression   . GERD (gastroesophageal reflux disease)   . Hyperlipidemia   . Low back pain    sciatica; sees orthopedic specialist ([facility])for this and shoulder tendinitis  . Ob[place], diabetes, and hypertension syndrome ([place]   . Osteoarthritis, knee    Bilateral, s/p 3 total knee replacement surgerues on right, last 2005. Dr. BTonita Cong . Pellegrini-Steida syndrome    Myositis ossificans  . Seasonal allergies   . Shingles Jul 10 2012   LESIONS CLEARED BUT NOT TOLERATING ANY THING TIGHT AGAINST BODY--SHINGLES WERE AROSS BACK AND ABDOMEN    Past Surgical History:  Procedure Laterality Date  . ABDOMINAL HYSTERECTOMY  1984  . COLONOSCOPY    . left shoulder steroid injection    . TONSILLECTOMY  1977  . TOTAL HIP ARTHROPLASTY Right 10/10/2012   Procedure: R TOTAL  HIP ARTHROPLASTY ANTERIOR APPROACH;  Surgeon: CMcarthur Rossetti MD;  Location: [facility];  Service: Orthopedics;  Laterality: Right;  Right Total Hip Arthroplasty, Anterior Approach (C-Arm)  . TOTAL KNEE ARTHROPLASTY Right    with multiple revisions, 2003, 2005  . TOTAL KNEE ARTHROPLASTY Left 01/09/2013   Procedure: LEFT TOTAL KNEE ARTHROPLASTY;  Surgeon: CMcarthur Rossetti MD;  Location: [facility];  Service: Orthopedics;  Laterality: Left;  . TUBAL LIGATION  1978  . WISDOM TOOTH EXTRACTION  2013,2014    Family History  Problem Relation Age of Onset  . Diabetes Mother   . Hypertension Mother   . Hypertension Father   . Cancer Father     Lung CA  . Coronary artery disease Father   . Colon cancer Neg Hx     Social History   Social History  . Marital status: Married    Spouse name: N/A  . Number of children: N/A  . Years of education: 12th grade   Occupational History  .  [employer]   Social History Main Topics  . Smoking status: Never Smoker  . Smokeless tobacco: Never Used  . Alcohol use No  . Drug use: No  . Sexual activity: Not on file   Other Topics Concern  . Not on file   Social History Narrative   Married, 3 daughters.   Works at a [employer] completed high school   Nicotine: never   ETOH: no   Drugs: no  Current Outpatient Prescriptions:  .  aspirin 81 MG tablet, Take 81 mg by mouth daily., Disp: , Rfl:  .  Blood Glucose Monitoring Suppl (ONETOUCH VERIO IQ SYSTEM) W/DEVICE KIT, by Does not apply route., Disp: , Rfl:  .  cetirizine (ZYRTEC) 10 MG tablet, Take 10 mg by mouth at bedtime., Disp: , Rfl:  .  fluticasone (FLONASE) 50 MCG/ACT nasal spray, SHAKE LIQUID AND USE 2 SPRAYS IN EACH NOSTRIL DAILY, Disp: 16 g, Rfl: 5 .  furosemide (LASIX) 40 MG tablet, Take 0.5 tablets (20 mg total) by mouth daily., Disp: 30 tablet, Rfl: 1 .  gabapentin (NEURONTIN) 300 MG capsule, TAKE ONE CAPSULE BY MOUTH AT BEDTIME, Disp: 90  capsule, Rfl: 1 .  glucose blood test strip, Use as directed to check blood sugar once a day, Disp: 100 each, Rfl: 3 .  lisinopril (PRINIVIL,ZESTRIL) 20 MG tablet, TAKE 1 TABLET BY MOUTH EVERY DAY, Disp: 90 tablet, Rfl: 2 .  metFORMIN (GLUCOPHAGE) 1000 MG tablet, TAKE 1 TABLET BY MOUTH TWICE DAILY WITH A MEAL, Disp: 180 tablet, Rfl: 1 .  omeprazole (PRILOSEC) 10 MG capsule, Take 10 mg by mouth daily as needed., Disp: , Rfl:  .  ONETOUCH VERIO test strip, USE AS DIRECTED TO TEST BLOOD SUGAR ONCE DAILY, Disp: 100 each, Rfl: 2 .  pravastatin (PRAVACHOL) 40 MG tablet, TAKE 1 AND 1/2 TABLETS(60 MG) BY MOUTH DAILY, Disp: 135 tablet, Rfl: 2  EXAM:  There were no vitals filed for this visit.  There is no height or weight on file to calculate BMI.  GENERAL: vitals reviewed and listed above, alert, oriented, appears well hydrated and in no acute distress  HEENT: atraumatic, conjunttiva clear, no obvious abnormalities on inspection of external nose and ears  NECK: no obvious masses on inspection  LUNGS: clear to auscultation bilaterally, no wheezes, rales or rhonchi, good air movement  CV: HRRR, no peripheral edema  MS: moves all extremities without noticeable abnormality  PSYCH: pleasant and cooperative, no obvious depression or anxiety  ASSESSMENT AND PLAN:  Discussed the following assessment and plan:  No diagnosis found.  -Patient advised to return or notify a doctor immediately if symptoms worsen or persist or new concerns arise.  There are no Patient Instructions on file for this visit.  Colin Benton R., DO

## 2017-01-03 ENCOUNTER — Ambulatory Visit: Payer: PPO | Admitting: Family Medicine

## 2017-01-03 ENCOUNTER — Ambulatory Visit: Payer: PPO

## 2017-01-05 ENCOUNTER — Other Ambulatory Visit: Payer: Self-pay | Admitting: Family Medicine

## 2017-02-05 ENCOUNTER — Other Ambulatory Visit: Payer: Self-pay | Admitting: Family Medicine

## 2017-02-08 ENCOUNTER — Encounter: Payer: Self-pay | Admitting: Family Medicine

## 2017-02-08 ENCOUNTER — Ambulatory Visit (INDEPENDENT_AMBULATORY_CARE_PROVIDER_SITE_OTHER): Payer: PPO | Admitting: Family Medicine

## 2017-02-08 VITALS — BP 126/66 | HR 82 | Temp 98.9°F | Ht 66.0 in | Wt 185.2 lb

## 2017-02-08 DIAGNOSIS — E1149 Type 2 diabetes mellitus with other diabetic neurological complication: Secondary | ICD-10-CM | POA: Diagnosis not present

## 2017-02-08 DIAGNOSIS — B9789 Other viral agents as the cause of diseases classified elsewhere: Secondary | ICD-10-CM

## 2017-02-08 DIAGNOSIS — J329 Chronic sinusitis, unspecified: Secondary | ICD-10-CM | POA: Diagnosis not present

## 2017-02-08 MED ORDER — PREDNISONE 20 MG PO TABS
ORAL_TABLET | ORAL | 0 refills | Status: DC
Start: 2017-02-08 — End: 2017-08-25

## 2017-02-08 NOTE — Patient Instructions (Signed)
Sinsusitis Viral based on <10 days, no double sickening, lack of severity of symptoms in first 3 days. Educated on signs that bacterial infection may have developed (symptoms over 10 days, double sickening).   Treatment: -considered steroid: we opted to trial a course of prednisone  -other symptomatic care with mucinex -Antibiotic indicated: not at present but if not improving with prednisone or symptoms persist past 10 days would strongly consider antibiotic  She has a history of diabetes and I advised follow up with Dr. Maudie Mercury. Morning sugars have been under 100. I advised her to monitor as may run up some with prednisone. Luckily last a1c was under 7.   Finally, we reviewed reasons to return to care including if symptoms worsen or persist or new concerns arise (particularly fever or shortness of breath)  Meds ordered this encounter  Medications  . predniSONE (DELTASONE) 20 MG tablet    Sig: Take 1 tablet by mouth daily for 5 days, then 1/2 tablet daily for 2 days    Dispense:  6 tablet    Refill:  0

## 2017-02-08 NOTE — Progress Notes (Signed)
PCP: Lucretia Kern, DO  Subjective:  Renee Watts is a 66 y.o. year old very pleasant female patient who presents with sinusitis symptoms including nasal congestion with clear drainage, sinus tenderness in bilateral maxillary sinuses, cough productive of cloudy sputum -other symptoms include: neck feels stiff, some pain around TMJ. Feels slightly dizzy at time. Some mild nausea.  -day of illness:5 -Symptoms are worsening gradually -previous treatments: flonase and zyrtec -Hx of: allergies- thus her baseline flonase and zyrtec  Diabetes with CBGs below 100 fasting. She needs to reschedule follow up with Dr. Maudie Mercury  ROS-denies fever, SOB, NVD, tooth pain  Pertinent Past Medical History-  Patient Active Problem List   Diagnosis Date Noted  . Obesity 01/06/2016  . Low back pain 01/06/2016  . Diabetic peripheral neuropathy ([place]) 09/01/2012  . Hyperlipemia 09/16/2006  . Essential hypertension 09/16/2006  . GERD 09/16/2006  . Type II diabetes mellitus with neurological manifestations ([place]) 06/17/2006  . Osteoarthritis 06/17/2006  . DEPENDENT EDEMA, LEGS, BILATERAL 06/17/2006    Medications- reviewed  Current Outpatient Prescriptions  Medication Sig Dispense Refill  . aspirin 81 MG tablet Take 81 mg by mouth daily.    . Blood Glucose Monitoring Suppl (ONETOUCH VERIO IQ SYSTEM) W/DEVICE KIT by Does not apply route.    . cetirizine (ZYRTEC) 10 MG tablet Take 10 mg by mouth at bedtime.    . fluticasone (FLONASE) 50 MCG/ACT nasal spray SHAKE LIQUID AND USE 2 SPRAYS IN EACH NOSTRIL DAILY 16 g 5  . furosemide (LASIX) 40 MG tablet TAKE 1/2 TABLET(20 MG) BY MOUTH DAILY 30 tablet 3  . gabapentin (NEURONTIN) 300 MG capsule TAKE ONE CAPSULE BY MOUTH AT BEDTIME 90 capsule 1  . glucose blood test strip Use as directed to check blood sugar once a day 100 each 3  . lisinopril (PRINIVIL,ZESTRIL) 20 MG tablet TAKE 1 TABLET BY MOUTH EVERY DAY 90 tablet 2  . metFORMIN (GLUCOPHAGE) 1000 MG tablet TAKE 1  TABLET BY MOUTH TWICE DAILY WITH A MEAL 180 tablet 1  . omeprazole (PRILOSEC) 10 MG capsule Take 10 mg by mouth daily as needed.    Glory Rosebush VERIO test strip USE AS DIRECTED TO TEST BLOOD SUGAR ONCE DAILY 100 each 1  . pravastatin (PRAVACHOL) 40 MG tablet TAKE 1 AND 1/2 TABLETS(60 MG) BY MOUTH DAILY 135 tablet 2   No current facility-administered medications for this visit.     Objective: BP 126/66 (BP Location: Left Arm, Patient Position: Sitting, Cuff Size: Large)   Pulse 82   Temp 98.9 F (37.2 C) (Oral)   Ht 5' 6" (1.676 m)   Wt 185 lb 3.2 oz (84 kg)   SpO2 96%   BMI 29.89 kg/m  Gen: NAD, resting comfortably HEENT: Turbinates erythematous with clear drainage, TM normal, pharynx mildly erythematous with no tonsilar exudate or edema, maxillary sinus tenderness CV: RRR no murmurs rubs or gallops Lungs: CTAB no crackles, wheeze, rhonchi Ext: no edema Skin: warm, dry, no rash  Assessment/Plan:  Sinsusitis Viral based on <10 days, no double sickening, lack of severity of symptoms in first 3 days. Educated on signs that bacterial infection may have developed (symptoms over 10 days, double sickening).   Treatment: -considered steroid: we opted to trial a course of prednisone  -other symptomatic care with mucinex -Antibiotic indicated: not at present but if not improving with prednisone or symptoms persist past 10 days would strongly consider antibiotic  She has a history of diabetes and I advised follow up with  Dr. Maudie Mercury. Morning sugars have been under 100. I advised her to monitor as may run up some with prednisone. Luckily last a1c was under 7.   Finally, we reviewed reasons to return to care including if symptoms worsen or persist or new concerns arise (particularly fever or shortness of breath)  Meds ordered this encounter  Medications  . predniSONE (DELTASONE) 20 MG tablet    Sig: Take 1 tablet by mouth daily for 5 days, then 1/2 tablet daily for 2 days    Dispense:  6  tablet    Refill:  0    Garret Reddish, MD

## 2017-02-11 ENCOUNTER — Other Ambulatory Visit: Payer: Self-pay | Admitting: Family Medicine

## 2017-02-13 DIAGNOSIS — N398 Other specified disorders of urinary system: Secondary | ICD-10-CM | POA: Diagnosis not present

## 2017-02-13 DIAGNOSIS — N993 Prolapse of vaginal vault after hysterectomy: Secondary | ICD-10-CM | POA: Insufficient documentation

## 2017-02-13 DIAGNOSIS — N8110 Cystocele, unspecified: Secondary | ICD-10-CM | POA: Diagnosis not present

## 2017-02-13 DIAGNOSIS — R8290 Unspecified abnormal findings in urine: Secondary | ICD-10-CM | POA: Diagnosis not present

## 2017-03-06 DIAGNOSIS — N993 Prolapse of vaginal vault after hysterectomy: Secondary | ICD-10-CM | POA: Diagnosis not present

## 2017-03-06 DIAGNOSIS — E119 Type 2 diabetes mellitus without complications: Secondary | ICD-10-CM | POA: Diagnosis not present

## 2017-03-06 DIAGNOSIS — N393 Stress incontinence (female) (male): Secondary | ICD-10-CM | POA: Diagnosis not present

## 2017-03-07 DIAGNOSIS — Z01810 Encounter for preprocedural cardiovascular examination: Secondary | ICD-10-CM | POA: Diagnosis not present

## 2017-03-11 ENCOUNTER — Encounter: Payer: Self-pay | Admitting: Family Medicine

## 2017-03-11 ENCOUNTER — Ambulatory Visit (INDEPENDENT_AMBULATORY_CARE_PROVIDER_SITE_OTHER): Payer: PPO | Admitting: Family Medicine

## 2017-03-11 VITALS — BP 102/60 | HR 75 | Temp 98.7°F | Ht 66.0 in | Wt 179.7 lb

## 2017-03-11 DIAGNOSIS — E1142 Type 2 diabetes mellitus with diabetic polyneuropathy: Secondary | ICD-10-CM | POA: Diagnosis not present

## 2017-03-11 DIAGNOSIS — E785 Hyperlipidemia, unspecified: Secondary | ICD-10-CM

## 2017-03-11 DIAGNOSIS — I10 Essential (primary) hypertension: Secondary | ICD-10-CM | POA: Diagnosis not present

## 2017-03-11 DIAGNOSIS — E1149 Type 2 diabetes mellitus with other diabetic neurological complication: Secondary | ICD-10-CM | POA: Diagnosis not present

## 2017-03-11 LAB — CBC
HEMATOCRIT: 38.5 % (ref 36.0–46.0)
Hemoglobin: 13.2 g/dL (ref 12.0–15.0)
MCHC: 34.3 g/dL (ref 30.0–36.0)
MCV: 90.5 fl (ref 78.0–100.0)
PLATELETS: 194 10*3/uL (ref 150.0–400.0)
RBC: 4.25 Mil/uL (ref 3.87–5.11)
RDW: 14.1 % (ref 11.5–15.5)
WBC: 6.9 10*3/uL (ref 4.0–10.5)

## 2017-03-11 LAB — BASIC METABOLIC PANEL
BUN: 20 mg/dL (ref 6–23)
CHLORIDE: 98 meq/L (ref 96–112)
CO2: 30 mEq/L (ref 19–32)
CREATININE: 0.58 mg/dL (ref 0.40–1.20)
Calcium: 10.6 mg/dL — ABNORMAL HIGH (ref 8.4–10.5)
GFR: 133.36 mL/min (ref >60.00)
Glucose, Bld: 403 mg/dL — ABNORMAL HIGH (ref 70–99)
POTASSIUM: 4.5 meq/L (ref 3.5–5.1)
SODIUM: 136 meq/L (ref 135–145)

## 2017-03-11 LAB — HEMOGLOBIN A1C: HEMOGLOBIN A1C: 9.1 % — AB (ref 4.6–6.5)

## 2017-03-11 NOTE — Patient Instructions (Signed)
BEFORE YOU LEAVE: -follow up: CPE with Dr. Maudie Mercury and Medicare exam with Manuela Schwartz on same day in 1 month. Come fasting. -labs  Stop eating sweets, watermelon, cantaloupe and sweetened cereals.   We recommend the following healthy lifestyle for LIFE: 1) Small portions.   Tip: eat off of a salad plate instead of a dinner plate.  Tip: It is ok to feel hungry after a meal - that likely means you ate an appropriate portion.  Tip: if you need more or a snack choose fruits, veggies and/or a handful of nuts or seeds.  2) Eat a healthy clean diet.   TRY TO EAT: -at least 5-7 servings of low sugar vegetables per day (not corn, potatoes or bananas.) -berries are the best choice if you wish to eat fruit.   -lean meets (fish, chicken or [place] breasts) -vegan proteins for some meals - beans or tofu, whole grains, nuts and seeds -Replace bad fats with good fats - good fats include: fish, nuts and seeds, canola oil, olive oil -small amounts of low fat or non fat dairy -small amounts of100 % whole grains - check the lables  AVOID: -SUGAR, sweets, anything with added sugar, corn syrup or sweeteners -if you must have a sweetener, small amounts of stevia may be best -sweetened beverages -simple starches (rice, bread, potatoes, pasta, chips, etc - small amounts of 100% whole grains are ok) -red meat, pork, butter -fried foods, fast food, processed food, excessive dairy, eggs and coconut.  3)Get at least 150 minutes of sweaty aerobic exercise per week.  4)Reduce stress - consider counseling, meditation and relaxation to balance other aspects of your life.

## 2017-03-11 NOTE — Progress Notes (Signed)
HPI:  Renee Watts is a pleasant 66 y.o. here for follow up. Chronic medical problems summarized below were reviewed for changes and stability and were updated as needed below. These issues and their treatment remain stable for the most part. Reports BS running high in 300s on random checks. Was scheduled for [facility] this month but received call sugars elevated. Reports eating lots of watermelon, cantaloupe and honey nut cherrios. Feels ok. Denies CP, SOB, DOE, treatment intolerance or new symptoms. Due for labs, foot exam, medicare exam.  DM:  -complications: neuropathy, microvascular disease -meds: metformin '1000mg'$  bid, asa, acei   HLD/Obesity:  -meds: pravastatin   HTN/LE edema:  -meds: lisinopril, lasix   GERD:  -meds:omeprazole   Anemia: -chronic, intermittent -last colon 10/2013 with small benign cecal ulcers, several polyps - repeat colon advised in 5 yers -b12 ok in the past, ferritin ok last check  ROS: See pertinent positives and negatives per HPI.  Past Medical History:  Diagnosis Date  . Benign paroxysmal positional vertigo 06/25/2013   dx with prior PCP, s/p labs, mri and vestibular rehab; nasal spray for etd seemed to help  . Blood in stool 03/11/2012  . Dependent edema    Bilateral  . Depression   . GERD (gastroesophageal reflux disease)   . Hyperlipidemia   . Low back pain    sciatica; sees orthopedic specialist ([facility])for this and shoulder tendinitis  . Obesity, diabetes, and hypertension syndrome ([place])   . Osteoarthritis, knee    Bilateral, s/p 3 total knee replacement surgerues on right, last 2005. Dr. Tonita Cong  . Pellegrini-Steida syndrome    Myositis ossificans  . Seasonal allergies   . Shingles Jul 10 2012   LESIONS CLEARED BUT NOT TOLERATING ANY THING TIGHT AGAINST BODY--SHINGLES WERE AROSS BACK AND ABDOMEN    Past Surgical History:  Procedure Laterality Date  . ABDOMINAL HYSTERECTOMY   1984  . COLONOSCOPY    . left shoulder steroid injection    . TONSILLECTOMY  1977  . TOTAL HIP ARTHROPLASTY Right 10/10/2012   Procedure: R TOTAL HIP ARTHROPLASTY ANTERIOR APPROACH;  Surgeon: Mcarthur Rossetti, MD;  Location: [facility];  Service: Orthopedics;  Laterality: Right;  Right Total Hip Arthroplasty, Anterior Approach (C-Arm)  . TOTAL KNEE ARTHROPLASTY Right    with multiple revisions, 2003, 2005  . TOTAL KNEE ARTHROPLASTY Left 01/09/2013   Procedure: LEFT TOTAL KNEE ARTHROPLASTY;  Surgeon: Mcarthur Rossetti, MD;  Location: [facility];  Service: Orthopedics;  Laterality: Left;  . TUBAL LIGATION  1978  . WISDOM TOOTH EXTRACTION  2013,2014    Family History  Problem Relation Age of Onset  . Diabetes Mother   . Hypertension Mother   . Hypertension Father   . Cancer Father        Lung CA  . Coronary artery disease Father   . Colon cancer Neg Hx     Social History   Social History  . Marital status: Married    Spouse name: N/A  . Number of children: N/A  . Years of education: 12th grade   Occupational History  .  [employer]   Social History Main Topics  . Smoking status: Never Smoker  . Smokeless tobacco: Never Used  . Alcohol use No  . Drug use: No  . Sexual activity: Not Asked   Other Topics Concern  . None   Social History Narrative   Married, 3 daughters.   Works at a Research scientist (medical)  facility   Education: completed high school   Nicotine: never   ETOH: no   Drugs: no     Current Outpatient Prescriptions:  .  aspirin 81 MG tablet, Take 81 mg by mouth daily., Disp: , Rfl:  .  Blood Glucose Monitoring Suppl (ONETOUCH VERIO IQ SYSTEM) W/DEVICE KIT, by Does not apply route., Disp: , Rfl:  .  cetirizine (ZYRTEC) 10 MG tablet, Take 10 mg by mouth at bedtime., Disp: , Rfl:  .  fluticasone (FLONASE) 50 MCG/ACT nasal spray, SHAKE LIQUID AND USE 2 SPRAYS IN EACH NOSTRIL DAILY, Disp: 16 g, Rfl: 5 .  furosemide (LASIX) 40 MG tablet, TAKE 1/2 TABLET(20  MG) BY MOUTH DAILY, Disp: 30 tablet, Rfl: 3 .  gabapentin (NEURONTIN) 300 MG capsule, TAKE ONE CAPSULE BY MOUTH AT BEDTIME, Disp: 90 capsule, Rfl: 1 .  glucose blood test strip, Use as directed to check blood sugar once a day, Disp: 100 each, Rfl: 3 .  lisinopril (PRINIVIL,ZESTRIL) 20 MG tablet, TAKE 1 TABLET BY MOUTH EVERY DAY, Disp: 90 tablet, Rfl: 2 .  metFORMIN (GLUCOPHAGE) 1000 MG tablet, TAKE 1 TABLET BY MOUTH TWICE DAILY WITH A MEAL, Disp: 180 tablet, Rfl: 1 .  omeprazole (PRILOSEC) 10 MG capsule, Take 10 mg by mouth daily as needed., Disp: , Rfl:  .  ONETOUCH VERIO test strip, USE AS DIRECTED TO TEST BLOOD SUGAR ONCE DAILY, Disp: 100 each, Rfl: 1 .  pravastatin (PRAVACHOL) 40 MG tablet, TAKE 1 AND 1/2 TABLETS(60 MG) BY MOUTH DAILY, Disp: 135 tablet, Rfl: 1 .  predniSONE (DELTASONE) 20 MG tablet, Take 1 tablet by mouth daily for 5 days, then 1/2 tablet daily for 2 days, Disp: 6 tablet, Rfl: 0  EXAM:  Vitals:   03/11/17 1405  BP: 102/60  Pulse: 75  Temp: 98.7 F (37.1 C)    Body mass index is 29 kg/m.  GENERAL: vitals reviewed and listed above, alert, oriented, appears well hydrated and in no acute distress  HEENT: atraumatic, conjunttiva clear, no obvious abnormalities on inspection of external nose and ears  NECK: no obvious masses on inspection  LUNGS: clear to auscultation bilaterally, no wheezes, rales or rhonchi, good air movement  CV: HRRR, no peripheral edema  MS: moves all extremities without noticeable abnormality  PSYCH: pleasant and cooperative, no obvious depression or anxiety  ASSESSMENT AND PLAN:  Discussed the following assessment and plan:  Type II diabetes mellitus with neurological manifestations (HCC) - Plan: Hemoglobin A1c Diabetic peripheral neuropathy (HCC) -discussed diet at length - all the sugary fruits and cereals likely contributing -advise healthy low sugar diet and regular exercise -advised postponing any non-urgent surgeries until  sugar under control if possible -labs today -she does not want to do any injections for diabetes, discussed other pharmacological options and will use as needed pending labs  Hyperlipidemia, unspecified hyperlipidemia type -will check at physical  Essential hypertension - Plan: Basic metabolic panel, CBC -labs -continue current treatment  Due for CPE/preventive care visit - advised to schedule  -Patient advised to return or notify a doctor immediately if symptoms worsen or persist or new concerns arise.  Patient Instructions  BEFORE YOU LEAVE: -follow up: CPE with Dr. Maudie Mercury and Medicare exam with Manuela Schwartz on same day in 1 month. Come fasting. -labs  Stop eating sweets, watermelon, cantaloupe and sweetened cereals.   We recommend the following healthy lifestyle for LIFE: 1) Small portions.   Tip: eat off of a salad plate instead of a dinner plate.  Tip:  It is ok to feel hungry after a meal - that likely means you ate an appropriate portion.  Tip: if you need more or a snack choose fruits, veggies and/or a handful of nuts or seeds.  2) Eat a healthy clean diet.   TRY TO EAT: -at least 5-7 servings of low sugar vegetables per day (not corn, potatoes or bananas.) -berries are the best choice if you wish to eat fruit.   -lean meets (fish, chicken or [place] breasts) -vegan proteins for some meals - beans or tofu, whole grains, nuts and seeds -Replace bad fats with good fats - good fats include: fish, nuts and seeds, canola oil, olive oil -small amounts of low fat or non fat dairy -small amounts of100 % whole grains - check the lables  AVOID: -SUGAR, sweets, anything with added sugar, corn syrup or sweeteners -if you must have a sweetener, small amounts of stevia may be best -sweetened beverages -simple starches (rice, bread, potatoes, pasta, chips, etc - small amounts of 100% whole grains are ok) -red meat, pork, butter -fried foods, fast food, processed food, excessive dairy, eggs  and coconut.  3)Get at least 150 minutes of sweaty aerobic exercise per week.  4)Reduce stress - consider counseling, meditation and relaxation to balance other aspects of your life.      Colin Benton R., DO

## 2017-03-12 ENCOUNTER — Ambulatory Visit: Payer: PPO | Admitting: Family Medicine

## 2017-03-17 NOTE — Progress Notes (Signed)
HPI:  Acute visit for follow up on uncontrolled diabetes: -she has made dramatic changes in her diet and reports sugars are already improving and now the last few days are in 160-190 range random -she reports she wants to try to avoid medication changes, but she is willing to start glipizide as took this in the past then was able to stop it with lifestyle changes -she has been cutting back on sweets, sweet fruits and sweetened cereal and is eating more veggies and lean meats - has strong will power  -no regular exercise -she had non urgent bladder urological surgery planned, but she has postponed this in light of her poorly controlled diabetes -some fatigue, but denies vision changes, polyuria worsened, low blood sugars  ROS: See pertinent positives and negatives per HPI.  Past Medical History:  Diagnosis Date  . Benign paroxysmal positional vertigo 06/25/2013   dx with prior PCP, s/p labs, mri and vestibular rehab; nasal spray for etd seemed to help  . Blood in stool 03/11/2012  . Dependent edema    Bilateral  . Depression   . GERD (gastroesophageal reflux disease)   . Hyperlipidemia   . Low back pain    sciatica; sees orthopedic specialist ([facility])for this and shoulder tendinitis  . Obesity, diabetes, and hypertension syndrome ([place])   . Osteoarthritis, knee    Bilateral, s/p 3 total knee replacement surgerues on right, last 2005. Dr. Tonita Cong  . Pellegrini-Steida syndrome    Myositis ossificans  . Seasonal allergies   . Shingles Jul 10 2012   LESIONS CLEARED BUT NOT TOLERATING ANY THING TIGHT AGAINST BODY--SHINGLES WERE AROSS BACK AND ABDOMEN    Past Surgical History:  Procedure Laterality Date  . ABDOMINAL HYSTERECTOMY  1984  . COLONOSCOPY    . left shoulder steroid injection    . TONSILLECTOMY  1977  . TOTAL HIP ARTHROPLASTY Right 10/10/2012   Procedure: R TOTAL HIP ARTHROPLASTY ANTERIOR APPROACH;  Surgeon: Mcarthur Rossetti, MD;  Location: [facility];  Service:  Orthopedics;  Laterality: Right;  Right Total Hip Arthroplasty, Anterior Approach (C-Arm)  . TOTAL KNEE ARTHROPLASTY Right    with multiple revisions, 2003, 2005  . TOTAL KNEE ARTHROPLASTY Left 01/09/2013   Procedure: LEFT TOTAL KNEE ARTHROPLASTY;  Surgeon: Mcarthur Rossetti, MD;  Location: [facility];  Service: Orthopedics;  Laterality: Left;  . TUBAL LIGATION  1978  . WISDOM TOOTH EXTRACTION  2013,2014    Family History  Problem Relation Age of Onset  . Diabetes Mother   . Hypertension Mother   . Hypertension Father   . Cancer Father        Lung CA  . Coronary artery disease Father   . Colon cancer Neg Hx     Social History   Social History  . Marital status: Married    Spouse name: N/A  . Number of children: N/A  . Years of education: 12th grade   Occupational History  .  [employer]   Social History Main Topics  . Smoking status: Never Smoker  . Smokeless tobacco: Never Used  . Alcohol use No  . Drug use: No  . Sexual activity: Not Asked   Other Topics Concern  . None   Social History Narrative   Married, 3 daughters.   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   Nicotine: never   ETOH: no   Drugs: no     Current Outpatient Prescriptions:  .  aspirin 81 MG  tablet, Take 81 mg by mouth daily., Disp: , Rfl:  .  Blood Glucose Monitoring Suppl (ONETOUCH VERIO IQ SYSTEM) W/DEVICE KIT, by Does not apply route., Disp: , Rfl:  .  cetirizine (ZYRTEC) 10 MG tablet, Take 10 mg by mouth at bedtime., Disp: , Rfl:  .  fluticasone (FLONASE) 50 MCG/ACT nasal spray, SHAKE LIQUID AND USE 2 SPRAYS IN EACH NOSTRIL DAILY, Disp: 16 g, Rfl: 5 .  furosemide (LASIX) 40 MG tablet, TAKE 1/2 TABLET(20 MG) BY MOUTH DAILY, Disp: 30 tablet, Rfl: 3 .  gabapentin (NEURONTIN) 300 MG capsule, TAKE ONE CAPSULE BY MOUTH AT BEDTIME, Disp: 90 capsule, Rfl: 1 .  glucose blood test strip, Use as directed to check blood sugar once a day, Disp: 100 each, Rfl:  3 .  lisinopril (PRINIVIL,ZESTRIL) 20 MG tablet, TAKE 1 TABLET BY MOUTH EVERY DAY, Disp: 90 tablet, Rfl: 2 .  metFORMIN (GLUCOPHAGE) 1000 MG tablet, TAKE 1 TABLET BY MOUTH TWICE DAILY WITH A MEAL, Disp: 180 tablet, Rfl: 1 .  omeprazole (PRILOSEC) 10 MG capsule, Take 10 mg by mouth daily as needed., Disp: , Rfl:  .  ONETOUCH VERIO test strip, USE AS DIRECTED TO TEST BLOOD SUGAR ONCE DAILY, Disp: 100 each, Rfl: 1 .  pravastatin (PRAVACHOL) 40 MG tablet, TAKE 1 AND 1/2 TABLETS(60 MG) BY MOUTH DAILY, Disp: 135 tablet, Rfl: 1 .  predniSONE (DELTASONE) 20 MG tablet, Take 1 tablet by mouth daily for 5 days, then 1/2 tablet daily for 2 days, Disp: 6 tablet, Rfl: 0 .  glipiZIDE (GLUCOTROL) 5 MG tablet, Take 1 tablet (5 mg total) by mouth 2 (two) times daily before a meal., Disp: 60 tablet, Rfl: 3  EXAM:  Vitals:   03/18/17 1132  BP: 118/80  Pulse: 68  Temp: 98 F (36.7 C)    Body mass index is 29.13 kg/m.  GENERAL: vitals reviewed and listed above, alert, oriented, appears well hydrated and in no acute distress  HEENT: atraumatic, conjunttiva clear, no obvious abnormalities on inspection of external nose and ears  NECK: no obvious masses on inspection  LUNGS: clear to auscultation bilaterally, no wheezes, rales or rhonchi, good air movement  CV: HRRR, no peripheral edema  MS: moves all extremities without noticeable abnormality  PSYCH: pleasant and cooperative, no obvious depression or anxiety  ASSESSMENT AND PLAN:  Discussed the following assessment and plan:  Type II diabetes mellitus with neurological manifestations (HCC)  -discussed various treatment options - if wishes to quickly control surgery insulin would be best - she declined this options -she prefers to mainly do this with lifestyle changes, agrees to glipizide as she took it in the past -she declined referral to diabetes educator as she reports she knows what to do and has done it in the past -advised healthy regular  small low sugar meals -advised regular exercise -advised on goal bloods sugars, highs and lows -close follow up in 1 month per her preference -Patient advised to return or notify a doctor immediately if symptoms worsen or persist or new concerns arise.  Patient Instructions  Ensure you have follow up in 1 month.  Continue Metformin 1050m twice daily.  Add Glipzide 5 mg twice daily with meals.  Continue the healthy diet and add regular exercise with a goal of 30 minutes 4-5 days per week of sweaty aerobic exercise.  FOR YOUR DIABETES:  _0  Eat a healthy clean diet with avoidance of (less then 1 serving per week) processed foods, white starches, red meat, fast foods  and sweets and consisting of: * 5-9 servings of fresh or frozen fruits and vegetables (not corn or potatoes, not dried or canned) *nuts and seeds, beans *olives and olive oil *small portions of lean meats such as fish and white chicken  *small portions of whole grains  _0  Get AT LEAST 150 minutes of cardiovascular exercise per week - 30 minutes per day is best of sustained sweaty exercise.  _1  Take all of your medications every day as directed by your doctor. Call your doctor immediately if you have any questions about your medications or are running low.  _2  Check your blood sugar often and when you feel unwell and keep a log to bring to all health appointments. FASTING: before you eat anything in the morning - would like this to be under 130 on average POSTPRANDIAL: 1-2 hours after a meal; would like this to be under 160 on average  _3  If any low blood sugars < 70, eat a snack and call your doctor immediately.  _4  See an eye doctor every year and fax your diabetic eye exam to our office.  Fax: [phone]  _5  Take good care of your feet and keep them soft and callus free. Check your feet daily and wear comfortable shoes. See your doctor immediately if you have any cuts, calluses or wounds on your feet.    Colin Benton R., DO

## 2017-03-18 ENCOUNTER — Other Ambulatory Visit: Payer: Self-pay | Admitting: *Deleted

## 2017-03-18 ENCOUNTER — Ambulatory Visit (INDEPENDENT_AMBULATORY_CARE_PROVIDER_SITE_OTHER): Payer: PPO | Admitting: Family Medicine

## 2017-03-18 ENCOUNTER — Encounter: Payer: Self-pay | Admitting: Family Medicine

## 2017-03-18 VITALS — BP 118/80 | HR 68 | Temp 98.0°F | Ht 66.0 in | Wt 180.5 lb

## 2017-03-18 DIAGNOSIS — E1149 Type 2 diabetes mellitus with other diabetic neurological complication: Secondary | ICD-10-CM | POA: Diagnosis not present

## 2017-03-18 MED ORDER — GLUCOSE BLOOD VI STRP: ORAL_STRIP | 3 refills | Status: DC

## 2017-03-18 MED ORDER — GLIPIZIDE 5 MG PO TABS: 5.0000 mg | ORAL_TABLET | Freq: Two times a day (BID) | ORAL | 3 refills | Status: DC

## 2017-03-18 NOTE — Telephone Encounter (Signed)
Rx done. 

## 2017-03-18 NOTE — Patient Instructions (Signed)
Ensure you have follow up in 1 month.  Continue Metformin 1000mg  twice daily.  Add Glipzide 5 mg twice daily with meals.  Continue the healthy diet and add regular exercise with a goal of 30 minutes 4-5 days per week of sweaty aerobic exercise.  FOR YOUR DIABETES:  []  Eat a healthy clean diet with avoidance of (less then 1 serving per week) processed foods, white starches, red meat, fast foods and sweets and consisting of: * 5-9 servings of fresh or frozen fruits and vegetables (not corn or potatoes, not dried or canned) *nuts and seeds, beans *olives and olive oil *small portions of lean meats such as fish and white chicken  *small portions of whole grains  []  Get AT LEAST 150 minutes of cardiovascular exercise per week - 30 minutes per day is best of sustained sweaty exercise.  []  Take all of your medications every day as directed by your doctor. Call your doctor immediately if you have any questions about your medications or are running low.  []  Check your blood sugar often and when you feel unwell and keep a log to bring to all health appointments. FASTING: before you eat anything in the morning - would like this to be under 130 on average POSTPRANDIAL: 1-2 hours after a meal; would like this to be under 160 on average  []  If any low blood sugars < 70, eat a snack and call your doctor immediately.  []  See an eye doctor every year and fax your diabetic eye exam to our office.  Fax: [phone]  []  Take good care of your feet and keep them soft and callus free. Check your feet daily and wear comfortable shoes. See your doctor immediately if you have any cuts, calluses or wounds on your feet.

## 2017-03-27 HISTORY — PX: BLADDER SURGERY: SHX569

## 2017-04-11 NOTE — Progress Notes (Signed)
HPI:   Acute visit for neck/shouler pain and head tension: -started about 1 month ago -has had more stress about her diabetes -tension and pain in bilat traps, neck and band of tension where glasses fall, sometimes sinus pressure -no radiation of pain to extremities, known trauma or change in activities or sleeping position, weakness, numbness, fevers, malaise, dizziness, vision changes, falls   ROS: See pertinent positives and negatives per HPI.  Past Medical History:  Diagnosis Date  . Benign paroxysmal positional vertigo 06/25/2013   dx with prior PCP, s/p labs, mri and vestibular rehab; nasal spray for etd seemed to help  . Blood in stool 03/11/2012  . Dependent edema    Bilateral  . Depression   . GERD (gastroesophageal reflux disease)   . Hyperlipidemia   . Low back pain    sciatica; sees orthopedic specialist ([facility])for this and shoulder tendinitis  . Obesity, diabetes, and hypertension syndrome ([place])   . Osteoarthritis, knee    Bilateral, s/p 3 total knee replacement surgerues on right, last 2005. Dr. Tonita Cong  . Pellegrini-Steida syndrome    Myositis ossificans  . Seasonal allergies   . Shingles Jul 10 2012   LESIONS CLEARED BUT NOT TOLERATING ANY THING TIGHT AGAINST BODY--SHINGLES WERE AROSS BACK AND ABDOMEN    Past Surgical History:  Procedure Laterality Date  . ABDOMINAL HYSTERECTOMY  1984  . COLONOSCOPY    . left shoulder steroid injection    . TONSILLECTOMY  1977  . TOTAL HIP ARTHROPLASTY Right 10/10/2012   Procedure: R TOTAL HIP ARTHROPLASTY ANTERIOR APPROACH;  Surgeon: Mcarthur Rossetti, MD;  Location: [facility];  Service: Orthopedics;  Laterality: Right;  Right Total Hip Arthroplasty, Anterior Approach (C-Arm)  . TOTAL KNEE ARTHROPLASTY Right    with multiple revisions, 2003, 2005  . TOTAL KNEE ARTHROPLASTY Left 01/09/2013   Procedure: LEFT TOTAL KNEE ARTHROPLASTY;  Surgeon: Mcarthur Rossetti, MD;  Location: [facility];  Service: Orthopedics;   Laterality: Left;  . TUBAL LIGATION  1978  . WISDOM TOOTH EXTRACTION  2013,2014    Family History  Problem Relation Age of Onset  . Diabetes Mother   . Hypertension Mother   . Hypertension Father   . Cancer Father        Lung CA  . Coronary artery disease Father   . Colon cancer Neg Hx     Social History   Social History  . Marital status: Married    Spouse name: N/A  . Number of children: N/A  . Years of education: 12th grade   Occupational History  .  [employer]   Social History Main Topics  . Smoking status: Never Smoker  . Smokeless tobacco: Never Used  . Alcohol use No  . Drug use: No  . Sexual activity: Not Asked   Other Topics Concern  . None   Social History Narrative   Married, 3 daughters.   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   Nicotine: never   ETOH: no   Drugs: no     Current Outpatient Prescriptions:  .  aspirin 81 MG tablet, Take 81 mg by mouth daily., Disp: , Rfl:  .  Blood Glucose Monitoring Suppl (ONETOUCH VERIO IQ SYSTEM) W/DEVICE KIT, by Does not apply route., Disp: , Rfl:  .  cetirizine (ZYRTEC) 10 MG tablet, Take 10 mg by mouth at bedtime., Disp: , Rfl:  .  fluticasone (FLONASE) 50 MCG/ACT nasal spray, SHAKE LIQUID AND USE 2  SPRAYS IN EACH NOSTRIL DAILY, Disp: 16 g, Rfl: 5 .  furosemide (LASIX) 40 MG tablet, TAKE 1/2 TABLET(20 MG) BY MOUTH DAILY, Disp: 30 tablet, Rfl: 3 .  gabapentin (NEURONTIN) 300 MG capsule, TAKE ONE CAPSULE BY MOUTH AT BEDTIME, Disp: 90 capsule, Rfl: 1 .  glipiZIDE (GLUCOTROL) 5 MG tablet, Take 1 tablet (5 mg total) by mouth 2 (two) times daily before a meal., Disp: 60 tablet, Rfl: 3 .  glucose blood (ONETOUCH VERIO) test strip, USE AS DIRECTED TO TEST BLOOD SUGAR ONCE DAILY, Disp: 100 each, Rfl: 3 .  glucose blood test strip, Use as directed to check blood sugar once a day, Disp: 100 each, Rfl: 3 .  lisinopril (PRINIVIL,ZESTRIL) 20 MG tablet, TAKE 1 TABLET BY MOUTH EVERY  DAY, Disp: 90 tablet, Rfl: 2 .  metFORMIN (GLUCOPHAGE) 1000 MG tablet, TAKE 1 TABLET BY MOUTH TWICE DAILY WITH A MEAL, Disp: 180 tablet, Rfl: 1 .  omeprazole (PRILOSEC) 10 MG capsule, Take 10 mg by mouth daily as needed., Disp: , Rfl:  .  pravastatin (PRAVACHOL) 40 MG tablet, TAKE 1 AND 1/2 TABLETS(60 MG) BY MOUTH DAILY, Disp: 135 tablet, Rfl: 1 .  predniSONE (DELTASONE) 20 MG tablet, Take 1 tablet by mouth daily for 5 days, then 1/2 tablet daily for 2 days, Disp: 6 tablet, Rfl: 0 .  cyclobenzaprine (FLEXERIL) 5 MG tablet, Take 1 tablet (5 mg total) by mouth at bedtime as needed for muscle spasms., Disp: 10 tablet, Rfl: 0  EXAM:  Vitals:   04/12/17 1108  BP: 118/80  Pulse: 75  Temp: 98.7 F (37.1 C)    Body mass index is 28.8 kg/m.  GENERAL: vitals reviewed and listed above, alert, oriented, appears well hydrated and in no acute distress  HEENT: atraumatic, conjunttiva clear, no obvious abnormalities on inspection of external nose and ears  NECK: no obvious masses on inspection  MS: moves all extremities without noticeable abnormality, on standing observation she has head forward posture, elevated L shoulder, sidebending head to the R mild, she has decreased ROM in rotation of the head/neck to the R, normal movement and function UEs bilat, neg spurling, suboccipital muscle tension, TTP in bilat subocc nuscles R> L and bilat trap muscles R > L, R>L, OA fascia rot L, thoracic inlet rot R.  PSYCH: pleasant and cooperative, no obvious depression or anxiety  ASSESSMENT AND PLAN:  Discussed the following assessment and plan:  Neck pain  Tension headache  Head region somatic dysfunction  Cervical (neck) region somatic dysfunction  Thoracic region somatic dysfunction  Muscle spasm  -we discussed possible serious and likely etiologies, workup and treatment, treatment risks and return precautions - she has abnormal posture, somatic dysfunction, muscle tension on exam -after this  discussion, Atira opted for OMT (see below), muscle relaxer, home exercises, topical menthol and heat. -follow up advised 1 month, advise neck plain films if worsening or not improving -of course, we advised [place]  to return or notify a doctor immediately if symptoms worsen or persist or new concerns arise. -seeing susan for wellness visit today  PROCEDURE NOTE : OSTEOPATHIC MANIPULATION The decision today to treat with gentle Osteopathic Manipulative Therapy  (OMT) was based on physical exam findings. Verbal consent was  obtained after after explanation of risks and benefits. No Cervical HVLA  manipulation was performed. After consent was obtained, treatment was  performed as below:      Regions treated: Per billing codes     Techniques used: cervicothoracic MR, OA MR (  indirect), subocc inhibition, BMT The patient tolerated the treatment well and reported Improved - she had improved ROM of head and neck after treatment and improved symmetry in shoulder height. symptoms following treatment today. Follow up treatment was advised in: 1-2 weeks  Patient Instructions  BEFORE YOU LEAVE: -follow up: 1 month -talk with Wendie Simmer for order for the meter  Do the postural exercises I showed you daily.  Sit or stand against a wall with sacrum, shoulder blades and head touching the wall. Keep head/neck straight for this entire exercise. Gentle push backwards for 5 seconds against the wall then, Place hand on left side of head and keeping head straight press gently against hand counter pressure for 5 seconds then, Place hand of forehead and keeping head straight press gently forward with counter pressure against hand for 5 seconds then, Repeat on Right side of head. Repeat 10 times.  Can use heat (rice sock is good), topical menthol (tiger balm is good) and the muscle relaxer at night as needed.  I hope you are feeling better soon! Follow up sooner if worsening, new concerns or  you are not improving with treatment.      Colin Benton R., DO

## 2017-04-12 ENCOUNTER — Encounter: Payer: Self-pay | Admitting: Family Medicine

## 2017-04-12 ENCOUNTER — Ambulatory Visit (INDEPENDENT_AMBULATORY_CARE_PROVIDER_SITE_OTHER): Payer: PPO | Admitting: Family Medicine

## 2017-04-12 VITALS — BP 118/80 | HR 75 | Temp 98.7°F | Ht 66.25 in | Wt 179.8 lb

## 2017-04-12 DIAGNOSIS — M9900 Segmental and somatic dysfunction of head region: Secondary | ICD-10-CM | POA: Diagnosis not present

## 2017-04-12 DIAGNOSIS — M62838 Other muscle spasm: Secondary | ICD-10-CM | POA: Diagnosis not present

## 2017-04-12 DIAGNOSIS — Z0000 Encounter for general adult medical examination without abnormal findings: Secondary | ICD-10-CM | POA: Diagnosis not present

## 2017-04-12 DIAGNOSIS — M9902 Segmental and somatic dysfunction of thoracic region: Secondary | ICD-10-CM

## 2017-04-12 DIAGNOSIS — E1149 Type 2 diabetes mellitus with other diabetic neurological complication: Secondary | ICD-10-CM | POA: Diagnosis not present

## 2017-04-12 DIAGNOSIS — M542 Cervicalgia: Secondary | ICD-10-CM | POA: Diagnosis not present

## 2017-04-12 DIAGNOSIS — M9901 Segmental and somatic dysfunction of cervical region: Secondary | ICD-10-CM

## 2017-04-12 DIAGNOSIS — G44209 Tension-type headache, unspecified, not intractable: Secondary | ICD-10-CM

## 2017-04-12 LAB — LIPID PANEL
CHOL/HDL RATIO: 4
Cholesterol: 139 mg/dL (ref 0–200)
HDL: 36 mg/dL — ABNORMAL LOW (ref >39.00)
LDL Cholesterol: 74 mg/dL (ref 0–99)
NONHDL: 102.64
Triglycerides: 142 mg/dL (ref 0.0–149.0)
VLDL: 28.4 mg/dL (ref 0.0–40.0)

## 2017-04-12 MED ORDER — CYCLOBENZAPRINE HCL 5 MG PO TABS: 5.0000 mg | ORAL_TABLET | Freq: Every evening | ORAL | 0 refills | Status: DC | PRN

## 2017-04-12 NOTE — Patient Instructions (Addendum)
BEFORE YOU LEAVE: -follow up: 1 month -talk with Wendie Simmer for order for the meter  Do the postural exercises I showed you daily.  Sit or stand against a wall with sacrum, shoulder blades and head touching the wall. Keep head/neck straight for this entire exercise. Gentle push backwards for 5 seconds against the wall then, Place hand on left side of head and keeping head straight press gently against hand counter pressure for 5 seconds then, Place hand of forehead and keeping head straight press gently forward with counter pressure against hand for 5 seconds then, Repeat on Right side of head. Repeat 10 times.  Can use heat (rice sock is good), topical menthol (tiger balm is good) and the muscle relaxer at night as needed.  I hope you are feeling better soon! Follow up sooner if worsening, new concerns or you are not improving with treatment.    Renee Watts , Thank you for taking time to come for your Medicare Wellness Visit. I appreciate your ongoing commitment to your health goals. Please review the following plan we discussed and let me know if I can assist you in the future.    You have a small amount of bone loss; please be sure your calcium intake is 1200  And vit d 800 to 1000 u per day  Waking briskly 30 minutes; 5 days a week will help to lower your A1c  Good luck with your surgery! Keep your an blood sugars for your surgeon  Please schedule your mammogram soon!    These are the goals we discussed: Goals    . Blood Pressure < 140/90    . HEMOGLOBIN A1C < 7.0    . LDL CALC < 100       This is a list of the screening recommended for you and due dates:  Health Maintenance  Topic Date Due  . Complete foot exam   01/05/2017  . Flu Shot  03/27/2017  . Lipid (cholesterol) test  05/10/2017  . Hemoglobin A1C  06/11/2017  . Eye exam for diabetics  07/26/2017  . Mammogram  12/12/2017  . Colon Cancer Screening  11/18/2018  . Tetanus Vaccine  07/18/2020  . DEXA scan  (bone density measurement)  Addressed  .  Hepatitis C: One time screening is recommended by Center for Disease Control  (CDC) for  adults born from [birth year] through 1966.   Completed  . Pneumonia vaccines  Completed    Prevention of falls: Remove rugs or any tripping hazards in the home Use Non slip mats in bathtubs and showers Placing grab bars next to the toilet and or shower Placing handrails on both sides of the stair way Adding extra lighting in the home.   Personal safety issues reviewed:  1. Consider starting a community watch program per [facility] 2.  Changes batteries is smoke detector and/or carbon monoxide detector  3.  If you have firearms; keep them in a safe place 4.  Wear protection when in the sun; Always wear sunscreen or a hat; It is good to have your doctor check your skin annually or review any new areas of concern 5. Driving safety; Keep in the right lane; stay 3 car lengths behind the car in front of you on the highway; look 3 times prior to pulling out; carry your cell phone everywhere you go!    Learn about the Yellow Dot program:  The program allows first responders at your emergency to have access to who  your physician is, as well as your medications and medical conditions.  Citizens requesting the Yellow Dot Packages should contact Master Corporal Nunzio Cobbs at the [facility] [phone] for the first week of the program and beginning the week after Easter citizens should contact their Scientist, physiological.    Fall Prevention in the Home Falls can cause injuries and can affect people from all age groups. There are many simple things that you can do to make your home safe and to help prevent falls. What can I do on the outside of my home?  Regularly repair the edges of walkways and driveways and fix any cracks.  Remove high doorway thresholds.  Trim any shrubbery on the main path into your home.  Use bright  outdoor lighting.  Clear walkways of debris and clutter, including tools and rocks.  Regularly check that handrails are securely fastened and in good repair. Both sides of any steps should have handrails.  Install guardrails along the edges of any raised decks or porches.  Have leaves, snow, and ice cleared regularly.  Use sand or salt on walkways during winter months.  In the garage, clean up any spills right away, including grease or oil spills. What can I do in the bathroom?  Use night lights.  Install grab bars by the toilet and in the tub and shower. Do not use towel bars as grab bars.  Use non-skid mats or decals on the floor of the tub or shower.  If you need to sit down while you are in the shower, use a plastic, non-slip stool.  Keep the floor dry. Immediately clean up any water that spills on the floor.  Remove soap buildup in the tub or shower on a regular basis.  Attach bath mats securely with double-sided non-slip rug tape.  Remove throw rugs and other tripping hazards from the floor. What can I do in the bedroom?  Use night lights.  Make sure that a bedside light is easy to reach.  Do not use oversized bedding that drapes onto the floor.  Have a firm chair that has side arms to use for getting dressed.  Remove throw rugs and other tripping hazards from the floor. What can I do in the kitchen?  Clean up any spills right away.  Avoid walking on wet floors.  Place frequently used items in easy-to-reach places.  If you need to reach for something above you, use a sturdy step stool that has a grab bar.  Keep electrical cables out of the way.  Do not use floor polish or wax that makes floors slippery. If you have to use wax, make sure that it is non-skid floor wax.  Remove throw rugs and other tripping hazards from the floor. What can I do in the stairways?  Do not leave any items on the stairs.  Make sure that there are handrails on both sides of  the stairs. Fix handrails that are broken or loose. Make sure that handrails are as long as the stairways.  Check any carpeting to make sure that it is firmly attached to the stairs. Fix any carpet that is loose or worn.  Avoid having throw rugs at the top or bottom of stairways, or secure the rugs with carpet tape to prevent them from moving.  Make sure that you have a light switch at the top of the stairs and the bottom of the stairs. If you do not have them, have them installed.  What are some other fall prevention tips?  Wear closed-toe shoes that fit well and support your feet. Wear shoes that have rubber soles or low heels.  When you use a stepladder, make sure that it is completely opened and that the sides are firmly locked. Have someone hold the ladder while you are using it. Do not climb a closed stepladder.  Add color or contrast paint or tape to grab bars and handrails in your home. Place contrasting color strips on the first and last steps.  Use mobility aids as needed, such as canes, walkers, scooters, and crutches.  Turn on lights if it is dark. Replace any light bulbs that burn out.  Set up furniture so that there are clear paths. Keep the furniture in the same spot.  Fix any uneven floor surfaces.  Choose a carpet design that does not hide the edge of steps of a stairway.  Be aware of any and all pets.  Review your medicines with your healthcare provider. Some medicines can cause dizziness or changes in blood pressure, which increase your risk of falling. Talk with your health care provider about other ways that you can decrease your risk of falls. This may include working with a physical therapist or trainer to improve your strength, balance, and endurance. This information is not intended to replace advice given to you by your health care provider. Make sure you discuss any questions you have with your health care provider. Document Released: 08/03/2002 Document  Revised: 01/10/2016 Document Reviewed: 09/17/2014 Elsevier Interactive Patient Education  2017 [employer] Maintenance, Female Adopting a healthy lifestyle and getting preventive care can go a long way to promote health and wellness. Talk with your health care provider about what schedule of regular examinations is right for you. This is a good chance for you to check in with your provider about disease prevention and staying healthy. In between checkups, there are plenty of things you can do on your own. Experts have done a lot of research about which lifestyle changes and preventive measures are most likely to keep you healthy. Ask your health care provider for more information. Weight and diet Eat a healthy diet  Be sure to include plenty of vegetables, fruits, low-fat dairy products, and lean protein.  Do not eat a lot of foods high in solid fats, added sugars, or salt.  Get regular exercise. This is one of the most important things you can do for your health. ? Most adults should exercise for at least 150 minutes each week. The exercise should increase your heart rate and make you sweat (moderate-intensity exercise). ? Most adults should also do strengthening exercises at least twice a week. This is in addition to the moderate-intensity exercise.  Maintain a healthy weight  Body mass index (BMI) is a measurement that can be used to identify possible weight problems. It estimates body fat based on height and weight. Your health care provider can help determine your BMI and help you achieve or maintain a healthy weight.  For females 24 years of age and older: ? A BMI below 18.5 is considered underweight. ? A BMI of 18.5 to 24.9 is normal. ? A BMI of 25 to 29.9 is considered overweight. ? A BMI of 30 and above is considered obese.  Watch levels of cholesterol and blood lipids  You should start having your blood tested for lipids and cholesterol at 67 years of age, then have  this test every 5 years.  You may need to have your cholesterol levels checked more often if: ? Your lipid or cholesterol levels are high. ? You are older than 66 years of age. ? You are at high risk for heart disease.  Cancer screening Lung Cancer  Lung cancer screening is recommended for adults 66-43 years old who are at high risk for lung cancer because of a history of smoking.  A yearly low-dose CT scan of the lungs is recommended for people who: ? Currently smoke. ? Have quit within the past 15 years. ? Have at least a 30-pack-year history of smoking. A pack year is smoking an average of one pack of cigarettes a day for 1 year.  Yearly screening should continue until it has been 15 years since you quit.  Yearly screening should stop if you develop a health problem that would prevent you from having lung cancer treatment.  Breast Cancer  Practice breast self-awareness. This means understanding how your breasts normally appear and feel.  It also means doing regular breast self-exams. Let your health care provider know about any changes, no matter how small.  If you are in your 20s or 30s, you should have a clinical breast exam (CBE) by a health care provider every 1-3 years as part of a regular health exam.  If you are 75 or older, have a CBE every year. Also consider having a breast X-ray (mammogram) every year.  If you have a family history of breast cancer, talk to your health care provider about genetic screening.  If you are at high risk for breast cancer, talk to your health care provider about having an MRI and a mammogram every year.  Breast cancer gene (BRCA) assessment is recommended for women who have family members with BRCA-related cancers. BRCA-related cancers include: ? Breast. ? Ovarian. ? Tubal. ? Peritoneal cancers.  Results of the assessment will determine the need for genetic counseling and BRCA1 and BRCA2 testing.  Cervical Cancer Your health care  provider may recommend that you be screened regularly for cancer of the pelvic organs (ovaries, uterus, and vagina). This screening involves a pelvic examination, including checking for microscopic changes to the surface of your cervix (Pap test). You may be encouraged to have this screening done every 3 years, beginning at age 40.  For women ages 72-65, health care providers may recommend pelvic exams and Pap testing every 3 years, or they may recommend the Pap and pelvic exam, combined with testing for human papilloma virus (HPV), every 5 years. Some types of HPV increase your risk of cervical cancer. Testing for HPV may also be done on women of any age with unclear Pap test results.  Other health care providers may not recommend any screening for nonpregnant women who are considered low risk for pelvic cancer and who do not have symptoms. Ask your health care provider if a screening pelvic exam is right for you.  If you have had past treatment for cervical cancer or a condition that could lead to cancer, you need Pap tests and screening for cancer for at least 20 years after your treatment. If Pap tests have been discontinued, your risk factors (such as having a new sexual partner) need to be reassessed to determine if screening should resume. Some women have medical problems that increase the chance of getting cervical cancer. In these cases, your health care provider may recommend more frequent screening and Pap tests.  Colorectal Cancer  This type of cancer can be detected and  often prevented.  Routine colorectal cancer screening usually begins at 67 years of age and continues through 67 years of age.  Your health care provider may recommend screening at an earlier age if you have risk factors for colon cancer.  Your health care provider may also recommend using home test kits to check for hidden blood in the stool.  A small camera at the end of a tube can be used to examine your colon  directly (sigmoidoscopy or colonoscopy). This is done to check for the earliest forms of colorectal cancer.  Routine screening usually begins at age 30.  Direct examination of the colon should be repeated every 5-10 years through 67 years of age. However, you may need to be screened more often if early forms of precancerous polyps or small growths are found.  Skin Cancer  Check your skin from head to toe regularly.  Tell your health care provider about any new moles or changes in moles, especially if there is a change in a mole's shape or color.  Also tell your health care provider if you have a mole that is larger than the size of a pencil eraser.  Always use sunscreen. Apply sunscreen liberally and repeatedly throughout the day.  Protect yourself by wearing long sleeves, pants, a wide-brimmed hat, and sunglasses whenever you are outside.  Heart disease, diabetes, and high blood pressure  High blood pressure causes heart disease and increases the risk of stroke. High blood pressure is more likely to develop in: ? People who have blood pressure in the high end of the normal range (130-139/85-89 mm Hg). ? People who are overweight or obese. ? People who are African American.  If you are 40-1 years of age, have your blood pressure checked every 3-5 years. If you are 80 years of age or older, have your blood pressure checked every year. You should have your blood pressure measured twice-once when you are at a hospital or clinic, and once when you are not at a hospital or clinic. Record the average of the two measurements. To check your blood pressure when you are not at a hospital or clinic, you can use: ? An automated blood pressure machine at a pharmacy. ? A home blood pressure monitor.  If you are between 63 years and 44 years old, ask your health care provider if you should take aspirin to prevent strokes.  Have regular diabetes screenings. This involves taking a blood sample to  check your fasting blood sugar level. ? If you are at a normal weight and have a low risk for diabetes, have this test once every three years after 67 years of age. ? If you are overweight and have a high risk for diabetes, consider being tested at a younger age or more often. Preventing infection Hepatitis B  If you have a higher risk for hepatitis B, you should be screened for this virus. You are considered at high risk for hepatitis B if: ? You were born in a country where hepatitis B is common. Ask your health care provider which countries are considered high risk. ? Your parents were born in a high-risk country, and you have not been immunized against hepatitis B (hepatitis B vaccine). ? You have HIV or AIDS. ? You use needles to inject street drugs. ? You live with someone who has hepatitis B. ? You have had sex with someone who has hepatitis B. ? You get hemodialysis treatment. ? You take certain medicines for conditions,  including cancer, organ transplantation, and autoimmune conditions.  Hepatitis C  Blood testing is recommended for: ? Everyone born from [birth year] through 1966. ? Anyone with known risk factors for hepatitis C.  Sexually transmitted infections (STIs)  You should be screened for sexually transmitted infections (STIs) including gonorrhea and chlamydia if: ? You are sexually active and are younger than 67 years of age. ? You are older than 67 years of age and your health care provider tells you that you are at risk for this type of infection. ? Your sexual activity has changed since you were last screened and you are at an increased risk for chlamydia or gonorrhea. Ask your health care provider if you are at risk.  If you do not have HIV, but are at risk, it may be recommended that you take a prescription medicine daily to prevent HIV infection. This is called pre-exposure prophylaxis (PrEP). You are considered at risk if: ? You are sexually active and do not regularly  use condoms or know the HIV status of your partner(s). ? You take drugs by injection. ? You are sexually active with a partner who has HIV.  Talk with your health care provider about whether you are at high risk of being infected with HIV. If you choose to begin PrEP, you should first be tested for HIV. You should then be tested every 3 months for as long as you are taking PrEP. Pregnancy  If you are premenopausal and you may become pregnant, ask your health care provider about preconception counseling.  If you may become pregnant, take 400 to 800 micrograms (mcg) of folic acid every day.  If you want to prevent pregnancy, talk to your health care provider about birth control (contraception). Osteoporosis and menopause  Osteoporosis is a disease in which the bones lose minerals and strength with aging. This can result in serious bone fractures. Your risk for osteoporosis can be identified using a bone density scan.  If you are 50 years of age or older, or if you are at risk for osteoporosis and fractures, ask your health care provider if you should be screened.  Ask your health care provider whether you should take a calcium or vitamin D supplement to lower your risk for osteoporosis.  Menopause may have certain physical symptoms and risks.  Hormone replacement therapy may reduce some of these symptoms and risks. Talk to your health care provider about whether hormone replacement therapy is right for you. Follow these instructions at home:  Schedule regular health, dental, and eye exams.  Stay current with your immunizations.  Do not use any tobacco products including cigarettes, chewing tobacco, or electronic cigarettes.  If you are pregnant, do not drink alcohol.  If you are breastfeeding, limit how much and how often you drink alcohol.  Limit alcohol intake to no more than 1 drink per day for nonpregnant women. One drink equals 12 ounces of beer, 5 ounces of wine, or 1 ounces  of hard liquor.  Do not use street drugs.  Do not share needles.  Ask your health care provider for help if you need support or information about quitting drugs.  Tell your health care provider if you often feel depressed.  Tell your health care provider if you have ever been abused or do not feel safe at home. This information is not intended to replace advice given to you by your health care provider. Make sure you discuss any questions you have with your health care provider.  Document Released: 02/26/2011 Document Revised: 01/19/2016 Document Reviewed: 05/17/2015 Elsevier Interactive Patient Education  [employer].

## 2017-04-12 NOTE — Progress Notes (Addendum)
Subjective:   Renee Watts is a 66 y.o. female who presents for Medicare Annual (Subsequent) preventive examination.  The Patient was informed that the wellness visit is to identify future health risk and educate and initiate measures that can reduce risk for increased disease through the lifespan.    Annual Wellness Assessment; subsequent  Reports health as good  Married  Has an in home daycare; have 5;  children; 3 dtr Grandchildren  6 girls and one boy (one is nursing school [employer] - one is [employer] Sports coach )   Lives in one level Does have tub and walking in shower in the other   Lipids 04/2016 Ratio is 3 chol/hdl Repeat lipids today per Dr. 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  Recommended walking briskly 5 times a week x 30 minutes if do'able   Dental up to date  Stressors: resolving with family issues  Sleep patterns: sleeps well  Hearing Screening Comments: Hearing issues none Vision Screening Comments: Vision  Last October eye exam Has one annually It is on the record   Diabetic Foot Exam - Simple   Simple Foot Form Diabetic Foot exam was performed with the following findings:  Yes 04/12/2017 12:30 PM  Visual Inspection No deformities, no ulcerations, no other skin breakdown bilaterally:  Yes Sensation Testing Intact to touch and monofilament testing bilaterally:  Yes Pulse Check See comments:  Yes Comments Pulses minimal but feet were  cool to touch; color good  No edema, callous or c/o of pain Sensation good       Advanced Directives  She has the papers and will complete  In time   Patient Care Team: Lucretia Kern, DO as PCP - General (Family Medicine) Plyler, Chauncey Reading, RD as Dietitian (Dietician) Ara Kussmaul, MD (Ophthalmology) Alden Hipp, MD as Consulting Physician (Obstetrics and Gynecology) Assessed for additional providers  Immunization History  Administered Date(s) Administered  . Influenza Split 06/04/2011, 05/07/2012  . Influenza Whole 07/08/2007, 05/27/2008, 07/14/2009, 07/18/2010  . Influenza, High Dose Seasonal PF 05/10/2015, 05/10/2016  . Influenza,inj,Quad PF,36+ Mos 05/26/2013, 06/14/2014  . Influenza-Unspecified 05/27/2016  . Pneumococcal Conjugate-13 05/10/2015  . Pneumococcal Polysaccharide-23 07/18/2010, 05/10/2016  . Td 07/18/2010   Required Immunizations needed today  Screening test up to date or reviewed for plan of completion Health Maintenance Due  Topic Date Due  . FOOT EXAM  01/05/2017  . INFLUENZA VACCINE  03/27/2017    No foot issues assessed today  Will take flu vaccine when available     Cardiac Risk Factors include: advanced age (>68mn, >>28women);diabetes mellitus;dyslipidemia;family history of premature cardiovascular disease;hypertension     Objective:     Vitals: BP 118/80   Pulse 75   Temp 98.7 F (37.1 C) (Oral)   Ht 5' 6.25" (1.683 m)   Wt 179 lb 12.8 oz (81.6 kg)   BMI 28.80 kg/m  Body mass index is 28.8 kg/m.   Tobacco History  Smoking Status  . Never Smoker  Smokeless Tobacco  . Never Used     Counseling given: Yes   Past Medical History:  Diagnosis Date  . Benign paroxysmal positional vertigo 06/25/2013   dx with prior PCP, s/p labs, mri and vestibular rehab; nasal spray for etd seemed to help  . Blood in stool 03/11/2012  . Dependent edema    Bilateral  . Depression   . GERD (gastroesophageal reflux disease)   .  Hyperlipidemia   . Low back pain    sciatica; sees orthopedic specialist ([facility])for this and shoulder tendinitis  . Obesity, diabetes, and hypertension syndrome ([place])   . Osteoarthritis, knee    Bilateral, s/p 3 total knee replacement surgerues on right, last 2005. Dr. Tonita Cong  . Pellegrini-Steida syndrome    Myositis ossificans  . Seasonal allergies   . Shingles Jul 10 2012   LESIONS CLEARED BUT NOT TOLERATING ANY THING TIGHT AGAINST BODY--SHINGLES WERE AROSS BACK AND ABDOMEN   Past Surgical History:  Procedure Laterality Date  . ABDOMINAL HYSTERECTOMY  1984  . COLONOSCOPY    . left shoulder steroid injection    . TONSILLECTOMY  1977  . TOTAL HIP ARTHROPLASTY Right 10/10/2012   Procedure: R TOTAL HIP ARTHROPLASTY ANTERIOR APPROACH;  Surgeon: Mcarthur Rossetti, MD;  Location: [facility];  Service: Orthopedics;  Laterality: Right;  Right Total Hip Arthroplasty, Anterior Approach (C-Arm)  . TOTAL KNEE ARTHROPLASTY Right    with multiple revisions, 2003, 2005  . TOTAL KNEE ARTHROPLASTY Left 01/09/2013   Procedure: LEFT TOTAL KNEE ARTHROPLASTY;  Surgeon: Mcarthur Rossetti, MD;  Location: [facility];  Service: Orthopedics;  Laterality: Left;  . TUBAL LIGATION  1978  . WISDOM TOOTH EXTRACTION  2013,2014   Family History  Problem Relation Age of Onset  . Diabetes Mother   . Hypertension Mother   . Hypertension Father   . Cancer Father        Lung CA  . Coronary artery disease Father   . Colon cancer Neg Hx    History  Sexual Activity  . Sexual activity: Not on file    Outpatient Encounter Prescriptions as of 04/12/2017  Medication Sig  . aspirin 81 MG tablet Take 81 mg by mouth daily.  . Blood Glucose Monitoring Suppl (ONETOUCH VERIO IQ SYSTEM) W/DEVICE KIT by Does not apply route.  . cetirizine (ZYRTEC) 10 MG tablet Take 10 mg by mouth at bedtime.  . fluticasone (FLONASE) 50 MCG/ACT nasal spray SHAKE LIQUID AND USE 2 SPRAYS IN EACH NOSTRIL DAILY  . furosemide (LASIX)  40 MG tablet TAKE 1/2 TABLET(20 MG) BY MOUTH DAILY  . gabapentin (NEURONTIN) 300 MG capsule TAKE ONE CAPSULE BY MOUTH AT BEDTIME  . glipiZIDE (GLUCOTROL) 5 MG tablet Take 1 tablet (5 mg total) by mouth 2 (two) times daily before a meal.  . glucose blood (ONETOUCH VERIO) test strip USE AS DIRECTED TO TEST BLOOD SUGAR ONCE DAILY  . glucose blood test strip Use as directed to check blood sugar once a day  . lisinopril (PRINIVIL,ZESTRIL) 20 MG tablet TAKE 1 TABLET BY MOUTH EVERY DAY  . metFORMIN (GLUCOPHAGE) 1000 MG tablet TAKE 1 TABLET BY MOUTH TWICE DAILY WITH A MEAL  . omeprazole (PRILOSEC) 10 MG capsule Take 10 mg by mouth daily as needed.  . pravastatin (PRAVACHOL) 40 MG tablet TAKE 1 AND 1/2 TABLETS(60 MG) BY MOUTH DAILY  . predniSONE (DELTASONE) 20 MG tablet  Take 1 tablet by mouth daily for 5 days, then 1/2 tablet daily for 2 days  . cyclobenzaprine (FLEXERIL) 5 MG tablet Take 1 tablet (5 mg total) by mouth at bedtime as needed for muscle spasms.   No facility-administered encounter medications on file as of 04/12/2017.     Activities of Daily Living In your present state of health, do you have any difficulty performing the following activities: 04/12/2017  Hearing? N  Vision? N  Difficulty concentrating or making decisions? N  Walking or climbing stairs? N  Dressing or bathing? N  Doing errands, shopping? N  Preparing Food and eating ? N  Using the Toilet? N  In the past six months, have you accidently leaked urine? N  Do you have problems with loss of bowel control? N  Managing your Medications? N  Managing your Finances? N  Housekeeping or managing your Housekeeping? N  Some recent data might be hidden    Patient Care Team: Lucretia Kern, DO as PCP - General (Family Medicine) Plyler, Chauncey Reading, RD as Dietitian (Dietician) Ara Kussmaul, MD (Ophthalmology) Alden Hipp, MD as Consulting Physician (Obstetrics and Gynecology)  Sees bladder doctor     Assessment:      Exercise Activities and Dietary recommendations Current Exercise Habits: Home exercise routine, Type of exercise: walking, Time (Minutes): 30, Frequency (Times/Week): 3, Weekly Exercise (Minutes/Week): 90, Intensity: Moderate  Goals    . Blood Pressure < 140/90    . HEMOGLOBIN A1C < 7.0    . LDL CALC < 100      Fall Risk Fall Risk  04/12/2017 04/12/2017 08/25/2015 07/07/2013 06/25/2013  Falls in the past year? _0   Comment - - - - -  Risk for fall due to : - - - - Impaired balance/gait  Risk for fall due to: Comment - - - - -   Depression Screen PHQ 2/9 Scores 04/12/2017 04/12/2017 08/25/2015 07/07/2013  PHQ - 2 Score 0 0 1 0  PHQ- 9 Score - - - -     Cognitive Function Ad8 score reviewed for issues:  Issues making decisions:  Less interest in hobbies / activities:  Repeats questions, stories (family complaining):  Trouble using ordinary gadgets (microwave, computer, phone):  Forgets the month or year:   Mismanaging finances:   Remembering appts:  Daily problems with thinking and/or memory: Ad8 score is=0   MMSE - Mini Mental State Exam 04/12/2017  Not completed: (No Data)    waived for Ad8     Immunization History  Administered Date(s) Administered  . Influenza Split 06/04/2011, 05/07/2012  . Influenza Whole 07/08/2007, 05/27/2008, 07/14/2009, 07/18/2010  . Influenza, High Dose Seasonal PF 05/10/2015, 05/10/2016  . Influenza,inj,Quad PF,36+ Mos 05/26/2013, 06/14/2014  . Influenza-Unspecified 05/27/2016  . Pneumococcal Conjugate-13 05/10/2015  . Pneumococcal Polysaccharide-23 07/18/2010, 05/10/2016  . Td 07/18/2010   Screening Tests Health Maintenance  Topic Date Due  . FOOT EXAM  01/05/2017  . INFLUENZA VACCINE  03/27/2017  . LIPID PANEL  05/10/2017  . HEMOGLOBIN A1C  06/11/2017  . OPHTHALMOLOGY EXAM  07/26/2017  . MAMMOGRAM  12/12/2017  . COLONOSCOPY  11/18/2018  . TETANUS/TDAP  07/18/2020  . DEXA SCAN  Addressed  . Hepatitis C  Screening  Completed  . PNA vac Low Risk Adult  Completed      Plan:       PCP Notes   Health Maintenance Schedule  mammogram and had to cancel;Will reschedule when able  Bone density 11/2015 which  was normal  -1.0 noted on report; discussed calcium 1200 per day with Vit D 800 to 1000 unless she is in the sun for a couple of hours.  Will repeat colonoscopy 10/2018  To repeat eye exam 06/2017  Abnormal Screens  A1c but the patient is following bs and they are in the 120 range   Referrals  None today   Patient concerns; Scheduled for bladder tack on 8/20; Will take her BS to the surgeon prior to her surgery   Nurse Concerns; none  Next PCP apt TBS      I have personally reviewed and noted the following in the patient's chart:   . Medical and social history . Use of alcohol, tobacco or illicit drugs  . Current medications and supplements . Functional ability and status . Nutritional status . Physical activity . Advanced directives . List of other physicians . Hospitalizations, surgeries, and ER visits in previous 12 months . Vitals . Screenings to include cognitive, depression, and falls . Referrals and appointments  In addition, I have reviewed and discussed with patient certain preventive protocols, quality metrics, and best practice recommendations. A written personalized care plan for preventive services as well as general preventive health recommendations were provided to patient.     Wynetta Fines, RN  04/12/2017   Lucretia Kern., DO

## 2017-04-15 ENCOUNTER — Telehealth: Payer: Self-pay

## 2017-04-15 NOTE — Telephone Encounter (Signed)
Received PA request for Cyclobenzaprine. Pa submitted & pending. Key: [id number]

## 2017-04-16 NOTE — Telephone Encounter (Signed)
PA approved, form faxed back to pharmacy. 

## 2017-04-22 DIAGNOSIS — N393 Stress incontinence (female) (male): Secondary | ICD-10-CM | POA: Diagnosis not present

## 2017-04-22 DIAGNOSIS — N993 Prolapse of vaginal vault after hysterectomy: Secondary | ICD-10-CM | POA: Diagnosis not present

## 2017-04-22 DIAGNOSIS — N812 Incomplete uterovaginal prolapse: Secondary | ICD-10-CM | POA: Diagnosis not present

## 2017-04-25 ENCOUNTER — Encounter: Payer: PPO | Admitting: Family Medicine

## 2017-04-25 ENCOUNTER — Other Ambulatory Visit: Payer: Self-pay | Admitting: Family Medicine

## 2017-05-04 ENCOUNTER — Other Ambulatory Visit: Payer: Self-pay | Admitting: Family Medicine

## 2017-05-05 ENCOUNTER — Other Ambulatory Visit: Payer: Self-pay | Admitting: Family Medicine

## 2017-06-19 ENCOUNTER — Ambulatory Visit: Payer: PPO

## 2017-07-19 ENCOUNTER — Other Ambulatory Visit: Payer: Self-pay | Admitting: Family Medicine

## 2017-07-29 DIAGNOSIS — E119 Type 2 diabetes mellitus without complications: Secondary | ICD-10-CM | POA: Diagnosis not present

## 2017-07-29 DIAGNOSIS — I10 Essential (primary) hypertension: Secondary | ICD-10-CM | POA: Diagnosis not present

## 2017-07-29 DIAGNOSIS — H52222 Regular astigmatism, left eye: Secondary | ICD-10-CM | POA: Diagnosis not present

## 2017-07-29 DIAGNOSIS — H524 Presbyopia: Secondary | ICD-10-CM | POA: Diagnosis not present

## 2017-07-29 DIAGNOSIS — Z7984 Long term (current) use of oral hypoglycemic drugs: Secondary | ICD-10-CM | POA: Diagnosis not present

## 2017-07-29 DIAGNOSIS — H5203 Hypermetropia, bilateral: Secondary | ICD-10-CM | POA: Diagnosis not present

## 2017-07-29 DIAGNOSIS — H25093 Other age-related incipient cataract, bilateral: Secondary | ICD-10-CM | POA: Diagnosis not present

## 2017-07-29 LAB — HM DIABETES EYE EXAM

## 2017-08-25 NOTE — Progress Notes (Signed)
HPI:  Renee Watts is a pleasant 66 y.o. here for follow up. Chronic medical problems summarized below were reviewed for changes and stability and were updated as needed below. These issues and their treatment remain stable for the most part.  Reports doing well for the most part.  Her whole family had the flu when she had a milder version of it last week.  Feels better now.  Denies any of breath or trouble breathing.  No fevers.. Denies CP, SOB, DOE, treatment intolerance or new symptoms.  Due for labs, flu shot, eye exam  Neck pain: -advised 1 month follow up last visit (03/2016) - treated with OMT, HEP, muscle relaxer -resolved  DM:  -complications: neuropathy, microvascular disease -meds: metformin '1000mg'$  bid, glipizide, asa, acei, gabapentin -taking gabapentin bid and wants [place] to change rx - reports rxd for a long time for her neuropathy  HLD/Obesity:  -meds: pravastatin   HTN/LE edema:  -meds: lisinopril, lasix   GERD:  -meds:omeprazole   Anemia: -chronic, intermittent -last colon 10/2013 with small benign cecal ulcers, several polyps - repeat colon advised in 5 yers -b12 ok in the past, ferritin ok last check  ROS: See pertinent positives and negatives per HPI.  Past Medical History:  Diagnosis Date  . Benign paroxysmal positional vertigo 06/25/2013   dx with prior PCP, s/p labs, mri and vestibular rehab; nasal spray for etd seemed to help  . Blood in stool 03/11/2012  . Dependent edema    Bilateral  . Depression   . GERD (gastroesophageal reflux disease)   . Hyperlipidemia   . Low back pain    sciatica; sees orthopedic specialist ([facility])for this and shoulder tendinitis  . Obesity, diabetes, and hypertension syndrome ([place])   . Osteoarthritis, knee    Bilateral, s/p 3 total knee replacement surgerues on right, last 2005. Dr. Tonita Cong  . Pellegrini-Steida syndrome    Myositis ossificans  . Seasonal allergies   . Shingles Jul 10 2012   LESIONS  CLEARED BUT NOT TOLERATING ANY THING TIGHT AGAINST BODY--SHINGLES WERE AROSS BACK AND ABDOMEN    Past Surgical History:  Procedure Laterality Date  . ABDOMINAL HYSTERECTOMY  1984  . COLONOSCOPY    . left shoulder steroid injection    . TONSILLECTOMY  1977  . TOTAL HIP ARTHROPLASTY Right 10/10/2012   Procedure: R TOTAL HIP ARTHROPLASTY ANTERIOR APPROACH;  Surgeon: Mcarthur Rossetti, MD;  Location: [facility];  Service: Orthopedics;  Laterality: Right;  Right Total Hip Arthroplasty, Anterior Approach (C-Arm)  . TOTAL KNEE ARTHROPLASTY Right    with multiple revisions, 2003, 2005  . TOTAL KNEE ARTHROPLASTY Left 01/09/2013   Procedure: LEFT TOTAL KNEE ARTHROPLASTY;  Surgeon: Mcarthur Rossetti, MD;  Location: [facility];  Service: Orthopedics;  Laterality: Left;  . TUBAL LIGATION  1978  . WISDOM TOOTH EXTRACTION  2013,2014    Family History  Problem Relation Age of Onset  . Diabetes Mother   . Hypertension Mother   . Hypertension Father   . Cancer Father        Lung CA  . Coronary artery disease Father   . Colon cancer Neg Hx     Social History   Socioeconomic History  . Marital status: Married    Spouse name: None  . Number of children: None  . Years of education: 12th grade  . Highest education level: None  Social Needs  . Financial resource strain: None  . Food insecurity - worry: None  . Food insecurity -  inability: None  . Transportation needs - medical: None  . Transportation needs - non-medical: None  Occupational History    Employer: [employer]  Tobacco Use  . Smoking status: Never Smoker  . Smokeless tobacco: Never Used  Substance and Sexual Activity  . Alcohol use: No  . Drug use: No  . Sexual activity: None  Other Topics Concern  . None  Social History Narrative   Married, 3 daughters.   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   aspirin 81 MG tablet, Take 81 mg by mouth daily., Disp: , Rfl:  .  Blood Glucose Monitoring Suppl (ONETOUCH VERIO IQ SYSTEM) W/DEVICE KIT, by Does not apply route., Disp: , Rfl:  .  cetirizine (ZYRTEC) 10 MG tablet, Take 10 mg by mouth at bedtime., Disp: , Rfl:  .  fluticasone (FLONASE) 50 MCG/ACT nasal spray, SHAKE LIQUID AND USE 2 SPRAYS IN EACH NOSTRIL DAILY, Disp: 16 g, Rfl: 5 .  furosemide (LASIX) 40 MG tablet, TAKE 1/2 TABLET(20 MG) BY MOUTH DAILY, Disp: 30 tablet, Rfl: 3 .  gabapentin (NEURONTIN) 300 MG capsule, Take 1 capsule (300 mg total) by mouth 2 (two) times daily., Disp: 180 capsule, Rfl: 1 .  glipiZIDE (GLUCOTROL) 5 MG tablet, TAKE 1 TABLET(5 MG) BY MOUTH TWICE DAILY BEFORE A MEAL, Disp: 60 tablet, Rfl: 5 .  glucose blood (ONETOUCH VERIO) test strip, USE AS DIRECTED TO TEST BLOOD SUGAR ONCE DAILY, Disp: 100 each, Rfl: 3 .  glucose blood test strip, Use as directed to check blood sugar once a day, Disp: 100 each, Rfl: 3 .  lisinopril (PRINIVIL,ZESTRIL) 20 MG tablet, TAKE 1 TABLET BY MOUTH EVERY DAY, Disp: 90 tablet, Rfl: 1 .  metFORMIN (GLUCOPHAGE) 1000 MG tablet, TAKE 1 TABLET BY MOUTH TWICE DAILY WITH A MEAL, Disp: 180 tablet, Rfl: 1 .  omeprazole (PRILOSEC) 10 MG capsule, Take 10 mg by mouth daily as needed., Disp: , Rfl:  .  pravastatin (PRAVACHOL) 40 MG tablet, TAKE 1 AND 1/2 TABLETS(60 MG) BY MOUTH DAILY, Disp: 135 tablet, Rfl: 1  EXAM:  Vitals:   08/26/17 1042  BP: 118/80  Pulse: 65  Temp: 98.5 F (36.9 C)  SpO2: 98%    Body mass index is 28.8 kg/m.  GENERAL: vitals reviewed and listed above, alert, oriented, appears well hydrated and in no acute distress  HEENT: atraumatic, conjunttiva clear, no obvious abnormalities on inspection of external nose and ears  NECK: no obvious masses on inspection  LUNGS: clear to auscultation bilaterally, no wheezes, rales or rhonchi, good air movement  CV: HRRR, no peripheral edema  MS: moves all extremities without  noticeable abnormality  PSYCH: pleasant and cooperative, no obvious depression or anxiety  ASSESSMENT AND PLAN:  Discussed the following assessment and plan:  Diabetic peripheral neuropathy (HCC)  Type II diabetes mellitus with neurological manifestations ([place]) - Plan: Hemoglobin A1c  Hypertension associated with diabetes ([place]) - Plan: Basic metabolic panel, CBC  Hyperlipidemia associated with type 2 diabetes mellitus ([place])  -Flu shot per her request, risks/benefits provided -Lifestyle recommendations -Labs per orders, she prefers to do them next week -adjust treatment as needed pending orders -Changed gabapentin dosing per her request -Patient advised to return or notify a doctor immediately if symptoms worsen or persist or new concerns arise.  Patient Instructions  BEFORE YOU LEAVE: -flu shot -lab visit  today or in 1 week - you do not need to be fasting -follow up: 3 months  Get your diabetic eye exam  We have ordered labs or studies at this visit. It can take up to 1-2 weeks for results and processing. IF results require follow up or explanation, we will call you with instructions. Clinically stable results will be released to your [facility]. If you have not heard from [place] or cannot find your results in [facility] in 2 weeks please contact our office at [phone].  If you are not yet signed up for [facility]: [facility], please consider signing up.   We recommend the following healthy lifestyle for LIFE: 1) Small portions. But, make sure to get regular (at least 3 per day), healthy meals and small healthy snacks if needed.  2) Eat a healthy clean diet.   TRY TO EAT: -at least 5-7 servings of low sugar, colorful, and nutrient rich vegetables per day (not corn, potatoes or bananas.) -berries are the best choice if you wish to eat fruit (only eat small amounts if trying to reduce weight)  -lean meets (fish, white meat of chicken or [place]) -vegan proteins for some meals - beans or tofu,  whole grains, nuts and seeds -Replace bad fats with good fats - good fats include: fish, nuts and seeds, canola oil, olive oil -small amounts of low fat or non fat dairy -small amounts of100 % whole grains - check the lables -drink plenty of water  AVOID: -SUGAR, sweets, anything with added sugar, corn syrup or sweeteners - must read labels as even foods advertised as "healthy" often are loaded with sugar -if you must have a sweetener, small amounts of stevia may be best -sweetened beverages and artificially sweetened beverages -simple starches (rice, bread, potatoes, pasta, chips, etc - small amounts of 100% whole grains are ok) -red meat, pork, butter -fried foods, fast food, processed food, excessive dairy, eggs and coconut.  3)Get at least 150 minutes of sweaty aerobic exercise per week.  4)Reduce stress - consider counseling, meditation and relaxation to balance other aspects of your life.          Colin Benton R., DO

## 2017-08-26 ENCOUNTER — Ambulatory Visit: Payer: PPO | Admitting: Family Medicine

## 2017-08-26 ENCOUNTER — Encounter: Payer: Self-pay | Admitting: Family Medicine

## 2017-08-26 VITALS — BP 118/80 | HR 65 | Temp 98.5°F | Ht 66.25 in

## 2017-08-26 DIAGNOSIS — E1149 Type 2 diabetes mellitus with other diabetic neurological complication: Secondary | ICD-10-CM | POA: Diagnosis not present

## 2017-08-26 DIAGNOSIS — Z23 Encounter for immunization: Secondary | ICD-10-CM

## 2017-08-26 DIAGNOSIS — E1169 Type 2 diabetes mellitus with other specified complication: Secondary | ICD-10-CM | POA: Diagnosis not present

## 2017-08-26 DIAGNOSIS — E1142 Type 2 diabetes mellitus with diabetic polyneuropathy: Secondary | ICD-10-CM | POA: Diagnosis not present

## 2017-08-26 DIAGNOSIS — E785 Hyperlipidemia, unspecified: Secondary | ICD-10-CM | POA: Diagnosis not present

## 2017-08-26 DIAGNOSIS — I10 Essential (primary) hypertension: Secondary | ICD-10-CM

## 2017-08-26 DIAGNOSIS — E1159 Type 2 diabetes mellitus with other circulatory complications: Secondary | ICD-10-CM

## 2017-08-26 MED ORDER — GABAPENTIN 300 MG PO CAPS: 300.0000 mg | ORAL_CAPSULE | Freq: Two times a day (BID) | ORAL | 1 refills | Status: DC

## 2017-08-26 NOTE — Addendum Note (Signed)
Addended by: Agnes Lawrence on: 08/26/2017 11:17 AM   Modules accepted: Orders

## 2017-08-26 NOTE — Patient Instructions (Addendum)
BEFORE YOU LEAVE: -flu shot -lab visit today or in 1 week - you do not need to be fasting -follow up: 3 months  Get your diabetic eye exam  We have ordered labs or studies at this visit. It can take up to 1-2 weeks for results and processing. IF results require follow up or explanation, we will call you with instructions. Clinically stable results will be released to your [facility]. If you have not heard from [place] or cannot find your results in [facility] in 2 weeks please contact our office at [phone].  If you are not yet signed up for [facility], please consider signing up.   We recommend the following healthy lifestyle for LIFE: 1) Small portions. But, make sure to get regular (at least 3 per day), healthy meals and small healthy snacks if needed.  2) Eat a healthy clean diet.   TRY TO EAT: -at least 5-7 servings of low sugar, colorful, and nutrient rich vegetables per day (not corn, potatoes or bananas.) -berries are the best choice if you wish to eat fruit (only eat small amounts if trying to reduce weight)  -lean meets (fish, white meat of chicken or [place]) -vegan proteins for some meals - beans or tofu, whole grains, nuts and seeds -Replace bad fats with good fats - good fats include: fish, nuts and seeds, canola oil, olive oil -small amounts of low fat or non fat dairy -small amounts of100 % whole grains - check the lables -drink plenty of water  AVOID: -SUGAR, sweets, anything with added sugar, corn syrup or sweeteners - must read labels as even foods advertised as "healthy" often are loaded with sugar -if you must have a sweetener, small amounts of stevia may be best -sweetened beverages and artificially sweetened beverages -simple starches (rice, bread, potatoes, pasta, chips, etc - small amounts of 100% whole grains are ok) -red meat, pork, butter -fried foods, fast food, processed food, excessive dairy, eggs and coconut.  3)Get at least 150 minutes of sweaty aerobic  exercise per week.  4)Reduce stress - consider counseling, meditation and relaxation to balance other aspects of your life.

## 2017-09-09 ENCOUNTER — Other Ambulatory Visit: Payer: PPO

## 2017-09-11 ENCOUNTER — Other Ambulatory Visit (INDEPENDENT_AMBULATORY_CARE_PROVIDER_SITE_OTHER): Payer: PPO

## 2017-09-11 DIAGNOSIS — E1159 Type 2 diabetes mellitus with other circulatory complications: Secondary | ICD-10-CM

## 2017-09-11 DIAGNOSIS — E1149 Type 2 diabetes mellitus with other diabetic neurological complication: Secondary | ICD-10-CM | POA: Diagnosis not present

## 2017-09-11 DIAGNOSIS — I10 Essential (primary) hypertension: Secondary | ICD-10-CM

## 2017-09-11 LAB — BASIC METABOLIC PANEL
BUN: 11 mg/dL (ref 6–23)
CO2: 28 meq/L (ref 19–32)
Calcium: 9.1 mg/dL (ref 8.4–10.5)
Chloride: 104 mEq/L (ref 96–112)
Creatinine, Ser: 0.36 mg/dL — ABNORMAL LOW (ref 0.40–1.20)
GFR: 230.88 mL/min (ref >60.00)
Glucose, Bld: 93 mg/dL (ref 70–99)
POTASSIUM: 3.9 meq/L (ref 3.5–5.1)
Sodium: 138 mEq/L (ref 135–145)

## 2017-09-11 LAB — CBC
HCT: 35.7 % — ABNORMAL LOW (ref 36.0–46.0)
Hemoglobin: 11.7 g/dL — ABNORMAL LOW (ref 12.0–15.0)
MCHC: 32.6 g/dL (ref 30.0–36.0)
MCV: 89.5 fl (ref 78.0–100.0)
PLATELETS: 163 10*3/uL (ref 150.0–400.0)
RBC: 3.99 Mil/uL (ref 3.87–5.11)
RDW: 14.6 % (ref 11.5–15.5)
WBC: 5.8 10*3/uL (ref 4.0–10.5)

## 2017-09-11 LAB — HEMOGLOBIN A1C: HEMOGLOBIN A1C: 6.5 % (ref 4.6–6.5)

## 2017-10-16 ENCOUNTER — Other Ambulatory Visit: Payer: Self-pay | Admitting: Family Medicine

## 2017-10-24 ENCOUNTER — Encounter: Payer: Self-pay | Admitting: Family Medicine

## 2017-10-24 ENCOUNTER — Ambulatory Visit (INDEPENDENT_AMBULATORY_CARE_PROVIDER_SITE_OTHER): Payer: PPO | Admitting: Family Medicine

## 2017-10-24 VITALS — BP 110/62 | HR 88 | Temp 99.2°F | Ht 66.25 in | Wt 181.6 lb

## 2017-10-24 DIAGNOSIS — H66002 Acute suppurative otitis media without spontaneous rupture of ear drum, left ear: Secondary | ICD-10-CM | POA: Diagnosis not present

## 2017-10-24 DIAGNOSIS — J0110 Acute frontal sinusitis, unspecified: Secondary | ICD-10-CM

## 2017-10-24 DIAGNOSIS — H65192 Other acute nonsuppurative otitis media, left ear: Secondary | ICD-10-CM | POA: Diagnosis not present

## 2017-10-24 MED ORDER — AMOXICILLIN-POT CLAVULANATE 875-125 MG PO TABS: 1.0000 | ORAL_TABLET | Freq: Two times a day (BID) | ORAL | 0 refills | Status: DC

## 2017-10-24 NOTE — Progress Notes (Signed)
HPI:  Acute visit for sinus congestion: -Started about 2 weeks ago -Started with cold-like symptoms, nasal congestion, postnasal drip and cough -This is worsened over the last several days with thicker mucus from the nose, thicker postnasal drip frontal sinus pain is worse with leaning of for word and some ear pain on the left -Denies fevers, nausea, vomiting, shortness of breath, body aches She has been taking Zyrtec and Flonase  ROS: See pertinent positives and negatives per HPI.  Past Medical History:  Diagnosis Date  . Benign paroxysmal positional vertigo 06/25/2013   dx with prior PCP, s/p labs, mri and vestibular rehab; nasal spray for etd seemed to help  . Blood in stool 03/11/2012  . Dependent edema    Bilateral  . Depression   . GERD (gastroesophageal reflux disease)   . Hyperlipidemia   . Low back pain    sciatica; sees orthopedic specialist ([facility])for this and shoulder tendinitis  . Obesity, diabetes, and hypertension syndrome ([place])   . Osteoarthritis, knee    Bilateral, s/p 3 total knee replacement surgerues on right, last 2005. Dr. Tonita Cong  . Pellegrini-Steida syndrome    Myositis ossificans  . Seasonal allergies   . Shingles Jul 10 2012   LESIONS CLEARED BUT NOT TOLERATING ANY THING TIGHT AGAINST BODY--SHINGLES WERE AROSS BACK AND ABDOMEN    Past Surgical History:  Procedure Laterality Date  . ABDOMINAL HYSTERECTOMY  1984  . COLONOSCOPY    . left shoulder steroid injection    . TONSILLECTOMY  1977  . TOTAL HIP ARTHROPLASTY Right 10/10/2012   Procedure: R TOTAL HIP ARTHROPLASTY ANTERIOR APPROACH;  Surgeon: Mcarthur Rossetti, MD;  Location: [facility];  Service: Orthopedics;  Laterality: Right;  Right Total Hip Arthroplasty, Anterior Approach (C-Arm)  . TOTAL KNEE ARTHROPLASTY Right    with multiple revisions, 2003, 2005  . TOTAL KNEE ARTHROPLASTY Left 01/09/2013   Procedure: LEFT TOTAL KNEE ARTHROPLASTY;  Surgeon: Mcarthur Rossetti, MD;  Location:  [facility];  Service: Orthopedics;  Laterality: Left;  . TUBAL LIGATION  1978  . WISDOM TOOTH EXTRACTION  2013,2014    Family History  Problem Relation Age of Onset  . Diabetes Mother   . Hypertension Mother   . Hypertension Father   . Cancer Father        Lung CA  . Coronary artery disease Father   . Colon cancer Neg Hx     Social History   Socioeconomic History  . Marital status: Married    Spouse name: None  . Number of children: None  . Years of education: 12th grade  . Highest education level: None  Social Needs  . Financial resource strain: None  . Food insecurity - worry: None  . Food insecurity - inability: None  . Transportation needs - medical: None  . Transportation needs - non-medical: None  Occupational History    Employer: [employer]  Tobacco Use  . Smoking status: Never Smoker  . Smokeless tobacco: Never Used  Substance and Sexual Activity  . Alcohol use: No  . Drug use: No  . Sexual activity: None  Other Topics Concern  . None  Social History Narrative   Married, 3 daughters.   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  aspirin 81 MG tablet, Take 81 mg by mouth daily., Disp: , Rfl:  .  Blood Glucose Monitoring Suppl (ONETOUCH VERIO IQ SYSTEM) W/DEVICE KIT, by Does not apply route., Disp: , Rfl:  .  cetirizine (ZYRTEC) 10 MG tablet, Take 10 mg by mouth at bedtime., Disp: , Rfl:  .  fluticasone (FLONASE) 50 MCG/ACT nasal spray, SHAKE LIQUID AND USE 2 SPRAYS IN EACH NOSTRIL DAILY, Disp: 16 g, Rfl: 3 .  furosemide (LASIX) 40 MG tablet, TAKE 1/2 TABLET(20 MG) BY MOUTH DAILY, Disp: 30 tablet, Rfl: 3 .  gabapentin (NEURONTIN) 300 MG capsule, Take 1 capsule (300 mg total) by mouth 2 (two) times daily., Disp: 180 capsule, Rfl: 1 .  glipiZIDE (GLUCOTROL) 5 MG tablet, TAKE 1 TABLET(5 MG) BY MOUTH TWICE DAILY BEFORE A MEAL, Disp: 60 tablet,  Rfl: 5 .  glucose blood (ONETOUCH VERIO) test strip, USE AS DIRECTED TO TEST BLOOD SUGAR ONCE DAILY, Disp: 100 each, Rfl: 3 .  glucose blood test strip, Use as directed to check blood sugar once a day, Disp: 100 each, Rfl: 3 .  lisinopril (PRINIVIL,ZESTRIL) 20 MG tablet, TAKE 1 TABLET BY MOUTH EVERY DAY, Disp: 90 tablet, Rfl: 1 .  metFORMIN (GLUCOPHAGE) 1000 MG tablet, TAKE 1 TABLET BY MOUTH TWICE DAILY WITH A MEAL, Disp: 180 tablet, Rfl: 1 .  omeprazole (PRILOSEC) 10 MG capsule, Take 10 mg by mouth daily as needed., Disp: , Rfl:  .  pravastatin (PRAVACHOL) 40 MG tablet, TAKE 1 AND 1/2 TABLETS(60 MG) BY MOUTH DAILY, Disp: 135 tablet, Rfl: 1 .  amoxicillin-clavulanate (AUGMENTIN) 875-125 MG tablet, Take 1 tablet by mouth 2 (two) times daily., Disp: 20 tablet, Rfl: 0  EXAM:  Vitals:   10/24/17 1637  BP: 110/62  Pulse: 88  Temp: 99.2 F (37.3 C)  SpO2: 98%    Body mass index is 29.09 kg/m.  GENERAL: vitals reviewed and listed above, alert, oriented, appears well hydrated and in no acute distress  HEENT: atraumatic, conjunttiva clear, no obvious abnormalities on inspection of external nose and ears, normal appearance of ear canals and TMs except for clear effusion on the right, discolored effusion on the left, thick nasal congestion left greater than right, mild post oropharyngeal erythema with PND, no tonsillar edema or exudate, no sinus TTP  NECK: no obvious masses on inspection  LUNGS: clear to auscultation bilaterally, no wheezes, rales or rhonchi, good air movement  CV: HRRR, no peripheral edema  MS: moves all extremities without noticeable abnormality  PSYCH: pleasant and cooperative, no obvious depression or anxiety  ASSESSMENT AND PLAN:  Discussed the following assessment and plan:  Acute non-recurrent frontal sinusitis  Acute effusion of left ear  Acute suppurative otitis media of left ear without spontaneous rupture of tympanic membrane, recurrence not  specified  -Symptoms plus exam findings suggest acute sinusitis and developing left otitis media -She has already been treating with a allergy regimen, we discussed treatment options and risk at length -She opted to do Augmentin, prescription sent.  She may consider taking a probiotic as well. -Patient advised to return or notify a doctor immediately if symptoms worsen or persist or new concerns arise.  Patient Instructions  Take the antibiotic as prescribed (Augmentin) and per pharmacy instructions.  Nasal saline twice daily. Make sure to follow all instructions properly.  I hope you are feeling better soon! Seek care promptly if your symptoms worsen, new concerns arise or you are not improving with treatment.     Lucretia Kern, DO

## 2017-10-24 NOTE — Patient Instructions (Signed)
Take the antibiotic as prescribed (Augmentin) and per pharmacy instructions.  Nasal saline twice daily. Make sure to follow all instructions properly.  I hope you are feeling better soon! Seek care promptly if your symptoms worsen, new concerns arise or you are not improving with treatment.

## 2017-11-17 DIAGNOSIS — M25561 Pain in right knee: Secondary | ICD-10-CM | POA: Diagnosis not present

## 2017-12-11 NOTE — Progress Notes (Signed)
HPI:  Using dictation device. Unfortunately this device frequently misinterprets words/phrases.  Renee Watts is a pleasant 67 y.o. here for follow up. Chronic medical problems summarized below were reviewed for changes. Doing well for the most part. Did have mechanical fall - slipped on cleaner on her truck. Seeing ortho. Using cane. Reports no major injury but needs wheelchair parking decal form. Denies CP, SOB, DOE, treatment intolerance or new symptoms. Due for labs, eye exam (reports had already in October), mammo (agrees to call and schedule)  DM:  -complications: neuropathy, microvascular disease -meds: metformin '1000mg'$  bid, glipizide, asa, acei, gabapentin -taking gabapentin bid  - reports rxd for a long time for her neuropathy  HLD/Obesity:  -meds: pravastatin   HTN/LE edema:  -meds: lisinopril, lasix   GERD:  -meds:omeprazole   Anemia: -chronic, intermittent -last colon 10/2013 with small benign cecal ulcers, several polyps - repeat colon advised in 5 yers -b12 ok in the past, ferritin ok last check   ROS: See pertinent positives and negatives per HPI.  Past Medical History:  Diagnosis Date  . Benign paroxysmal positional vertigo 06/25/2013   dx with prior PCP, s/p labs, mri and vestibular rehab; nasal spray for etd seemed to help  . Blood in stool 03/11/2012  . Dependent edema    Bilateral  . Depression   . GERD (gastroesophageal reflux disease)   . Hyperlipidemia   . Low back pain    sciatica; sees orthopedic specialist ([facility])for this and shoulder tendinitis  . Obesity, diabetes, and hypertension syndrome ([place])   . Osteoarthritis, knee    Bilateral, s/p 3 total knee replacement surgerues on right, last 2005. Dr. Tonita Cong  . Pellegrini-Steida syndrome    Myositis ossificans  . Seasonal allergies   . Shingles Jul 10 2012   LESIONS CLEARED BUT NOT TOLERATING ANY THING TIGHT AGAINST BODY--SHINGLES WERE AROSS BACK AND ABDOMEN    Past  Surgical History:  Procedure Laterality Date  . ABDOMINAL HYSTERECTOMY  1984  . COLONOSCOPY    . left shoulder steroid injection    . TONSILLECTOMY  1977  . TOTAL HIP ARTHROPLASTY Right 10/10/2012   Procedure: R TOTAL HIP ARTHROPLASTY ANTERIOR APPROACH;  Surgeon: Mcarthur Rossetti, MD;  [facility];  Service: Orthopedics;  Laterality: Right;  Right Total Hip Arthroplasty, Anterior Approach (C-Arm)  . TOTAL KNEE ARTHROPLASTY Right    with multiple revisions, 2003, 2005  . TOTAL KNEE ARTHROPLASTY Left 01/09/2013   Procedure: LEFT TOTAL KNEE ARTHROPLASTY;  Surgeon: Mcarthur Rossetti, MD;  [facility];  Service: Orthopedics;  Laterality: Left;  . TUBAL LIGATION  1978  . WISDOM TOOTH EXTRACTION  2013,2014    Family History  Problem Relation Age of Onset  . Diabetes Mother   . Hypertension Mother   . Hypertension Father   . Cancer Father        Lung CA  . Coronary artery disease Father   . Colon cancer Neg Hx     SOCIAL HX:    Current Outpatient Medications:  .  aspirin 81 MG tablet, Take 81 mg by mouth daily., Disp: , Rfl:  .  Blood Glucose Monitoring Suppl (ONETOUCH VERIO IQ SYSTEM) W/DEVICE KIT, by Does not apply route., Disp: , Rfl:  .  cetirizine (ZYRTEC) 10 MG tablet, Take 10 mg by mouth at bedtime., Disp: , Rfl:  .  fluticasone (FLONASE) 50 MCG/ACT nasal spray, SHAKE LIQUID AND USE 2 SPRAYS IN EACH NOSTRIL DAILY, Disp: 16 g, Rfl: 3 .  furosemide (LASIX) 40 MG tablet, TAKE 1/2 TABLET(20 MG) BY MOUTH DAILY, Disp: 30 tablet, Rfl: 3 .  gabapentin (NEURONTIN) 300 MG capsule, Take 1 capsule (300 mg total) by mouth 2 (two) times daily., Disp: 180 capsule, Rfl: 1 .  glipiZIDE (GLUCOTROL) 5 MG tablet, TAKE 1 TABLET(5 MG) BY MOUTH TWICE DAILY BEFORE A MEAL, Disp: 60 tablet, Rfl: 5 .  glucose blood (ONETOUCH VERIO) test strip, USE AS DIRECTED TO TEST BLOOD SUGAR ONCE DAILY, Disp: 100 each, Rfl: 3 .  glucose blood test strip, Use as directed to check blood sugar once a  day, Disp: 100 each, Rfl: 3 .  lisinopril (PRINIVIL,ZESTRIL) 20 MG tablet, TAKE 1 TABLET BY MOUTH EVERY DAY, Disp: 90 tablet, Rfl: 1 .  metFORMIN (GLUCOPHAGE) 1000 MG tablet, TAKE 1 TABLET BY MOUTH TWICE DAILY WITH A MEAL, Disp: 180 tablet, Rfl: 1 .  omeprazole (PRILOSEC) 10 MG capsule, Take 10 mg by mouth daily as needed., Disp: , Rfl:  .  pravastatin (PRAVACHOL) 40 MG tablet, TAKE 1 AND 1/2 TABLETS(60 MG) BY MOUTH DAILY, Disp: 135 tablet, Rfl: 1  EXAM:  Vitals:   12/12/17 1107  BP: 110/60  Pulse: 69  Temp: 98.9 F (37.2 C)    Body mass index is 28.35 kg/m.  GENERAL: vitals reviewed and listed above, alert, oriented, appears well hydrated and in no acute distress  HEENT: atraumatic, conjunttiva clear, no obvious abnormalities on inspection of external nose and ears  NECK: no obvious masses on inspection  LUNGS: clear to auscultation bilaterally, no wheezes, rales or rhonchi, good air movement  CV: HRRR, no peripheral edema  MS: moves all extremities without noticeable abnormality  PSYCH: pleasant and cooperative, no obvious depression or anxiety  ASSESSMENT AND PLAN:  Discussed the following assessment and plan:  Type II diabetes mellitus with neurological manifestations ([place]) - Plan: Hemoglobin A1c  Diabetic peripheral neuropathy ([place])  Hypertension associated with diabetes ([place]) - Plan: Basic metabolic panel, CBC  Hyperlipidemia associated with type 2 diabetes mellitus (H[facility])  Pain of lower extremity, unspecified laterality  -labs -wheelchair form complete -lifestyle recs -seeing ortho for the leg issues -awv and follow up in august -Patient advised to return or notify a doctor immediately if symptoms worsen or persist or new concerns arise.  Patient Instructions  BEFORE YOU LEAVE: -labs -obtain and abstract eye exam -complete and scan wheelchair form -follow up: AWV with susan and CPE Dr. Maudie Mercury in august  [place] to schedule your mammogram  We have  ordered labs or studies at this visit. It can take up to 1-2 weeks for results and processing. IF results require follow up or explanation, we will call you with instructions. Clinically stable results will be released to your [facility]. If you have not heard from [place] or cannot find your results in [facility] in 2 weeks please contact our office at [phone].  If you are not yet signed up for [facility], please consider signing up.   We recommend the following healthy lifestyle for LIFE: 1) Small portions. But, make sure to get regular (at least 3 per day), healthy meals and small healthy snacks if needed.  2) Eat a healthy clean diet.   TRY TO EAT: -at least 5-7 servings of low sugar, colorful, and nutrient rich vegetables per day (not corn, potatoes or bananas.) -berries are the best choice if you wish to eat fruit (only eat small amounts if trying to reduce weight)  -lean meets (fish, white meat of chicken or [place]) -vegan  proteins for some meals - beans or tofu, whole grains, nuts and seeds -Replace bad fats with good fats - good fats include: fish, nuts and seeds, canola oil, olive oil -small amounts of low fat or non fat dairy -small amounts of100 % whole grains - check the lables -drink plenty of water  AVOID: -SUGAR, sweets, anything with added sugar, corn syrup or sweeteners - must read labels as even foods advertised as "healthy" often are loaded with sugar -if you must have a sweetener, small amounts of stevia may be best -sweetened beverages and artificially sweetened beverages -simple starches (rice, bread, potatoes, pasta, chips, etc - small amounts of 100% whole grains are ok) -red meat, pork, butter -fried foods, fast food, processed food, excessive dairy, eggs and coconut.  3)Get at least 150 minutes of sweaty aerobic exercise per week.  4)Reduce stress - consider counseling, meditation and relaxation to balance other aspects of your life.         Lucretia Kern,  DO

## 2017-12-12 ENCOUNTER — Encounter: Payer: Self-pay | Admitting: Family Medicine

## 2017-12-12 ENCOUNTER — Ambulatory Visit (INDEPENDENT_AMBULATORY_CARE_PROVIDER_SITE_OTHER): Payer: PPO | Admitting: Family Medicine

## 2017-12-12 VITALS — BP 110/60 | HR 69 | Temp 98.9°F | Ht 66.25 in | Wt 177.0 lb

## 2017-12-12 DIAGNOSIS — E1159 Type 2 diabetes mellitus with other circulatory complications: Secondary | ICD-10-CM | POA: Diagnosis not present

## 2017-12-12 DIAGNOSIS — E1169 Type 2 diabetes mellitus with other specified complication: Secondary | ICD-10-CM

## 2017-12-12 DIAGNOSIS — M79606 Pain in leg, unspecified: Secondary | ICD-10-CM | POA: Diagnosis not present

## 2017-12-12 DIAGNOSIS — E1142 Type 2 diabetes mellitus with diabetic polyneuropathy: Secondary | ICD-10-CM

## 2017-12-12 DIAGNOSIS — E785 Hyperlipidemia, unspecified: Secondary | ICD-10-CM | POA: Diagnosis not present

## 2017-12-12 DIAGNOSIS — I10 Essential (primary) hypertension: Secondary | ICD-10-CM | POA: Diagnosis not present

## 2017-12-12 DIAGNOSIS — E1149 Type 2 diabetes mellitus with other diabetic neurological complication: Secondary | ICD-10-CM

## 2017-12-12 LAB — BASIC METABOLIC PANEL WITH GFR
BUN: 8 mg/dL (ref 6–23)
CO2: 29 meq/L (ref 19–32)
Calcium: 9.6 mg/dL (ref 8.4–10.5)
Chloride: 103 meq/L (ref 96–112)
Creatinine, Ser: 0.42 mg/dL (ref 0.40–1.20)
GFR: 193.11 mL/min
Glucose, Bld: 92 mg/dL (ref 70–99)
Potassium: 3.6 meq/L (ref 3.5–5.1)
Sodium: 138 meq/L (ref 135–145)

## 2017-12-12 LAB — CBC
HCT: 35.3 % — ABNORMAL LOW (ref 36.0–46.0)
Hemoglobin: 11.9 g/dL — ABNORMAL LOW (ref 12.0–15.0)
MCHC: 33.6 g/dL (ref 30.0–36.0)
MCV: 90.7 fl (ref 78.0–100.0)
Platelets: 184 10*3/uL (ref 150.0–400.0)
RBC: 3.9 Mil/uL (ref 3.87–5.11)
RDW: 14.5 % (ref 11.5–15.5)
WBC: 6 10*3/uL (ref 4.0–10.5)

## 2017-12-12 LAB — HEMOGLOBIN A1C: Hgb A1c MFr Bld: 5.8 % (ref 4.6–6.5)

## 2017-12-12 NOTE — Patient Instructions (Signed)
BEFORE YOU LEAVE: -labs -obtain and abstract eye exam -complete and scan wheelchair form -follow up: AWV with susan and CPE Dr. Maudie Mercury in august  [place] to schedule your mammogram  We have ordered labs or studies at this visit. It can take up to 1-2 weeks for results and processing. IF results require follow up or explanation, we will call you with instructions. Clinically stable results will be released to your [facility]. If you have not heard from [place] or cannot find your results in [facility] in 2 weeks please contact our office at [phone].  If you are not yet signed up for [facility], please consider signing up.   We recommend the following healthy lifestyle for LIFE: 1) Small portions. But, make sure to get regular (at least 3 per day), healthy meals and small healthy snacks if needed.  2) Eat a healthy clean diet.   TRY TO EAT: -at least 5-7 servings of low sugar, colorful, and nutrient rich vegetables per day (not corn, potatoes or bananas.) -berries are the best choice if you wish to eat fruit (only eat small amounts if trying to reduce weight)  -lean meets (fish, white meat of chicken or [place]) -vegan proteins for some meals - beans or tofu, whole grains, nuts and seeds -Replace bad fats with good fats - good fats include: fish, nuts and seeds, canola oil, olive oil -small amounts of low fat or non fat dairy -small amounts of100 % whole grains - check the lables -drink plenty of water  AVOID: -SUGAR, sweets, anything with added sugar, corn syrup or sweeteners - must read labels as even foods advertised as "healthy" often are loaded with sugar -if you must have a sweetener, small amounts of stevia may be best -sweetened beverages and artificially sweetened beverages -simple starches (rice, bread, potatoes, pasta, chips, etc - small amounts of 100% whole grains are ok) -red meat, pork, butter -fried foods, fast food, processed food, excessive dairy, eggs and coconut.  3)Get at  least 150 minutes of sweaty aerobic exercise per week.  4)Reduce stress - consider counseling, meditation and relaxation to balance other aspects of your life.

## 2018-01-14 ENCOUNTER — Other Ambulatory Visit: Payer: Self-pay | Admitting: Family Medicine

## 2018-01-26 ENCOUNTER — Other Ambulatory Visit: Payer: Self-pay | Admitting: Family Medicine

## 2018-02-18 ENCOUNTER — Ambulatory Visit (INDEPENDENT_AMBULATORY_CARE_PROVIDER_SITE_OTHER): Payer: PPO | Admitting: Family Medicine

## 2018-02-18 ENCOUNTER — Encounter: Payer: Self-pay | Admitting: Family Medicine

## 2018-02-18 VITALS — BP 128/72 | HR 67 | Temp 98.6°F | Ht 66.25 in

## 2018-02-18 DIAGNOSIS — L989 Disorder of the skin and subcutaneous tissue, unspecified: Secondary | ICD-10-CM

## 2018-02-18 DIAGNOSIS — R7612 Nonspecific reaction to cell mediated immunity measurement of gamma interferon antigen response without active tuberculosis: Secondary | ICD-10-CM | POA: Diagnosis not present

## 2018-02-18 DIAGNOSIS — D649 Anemia, unspecified: Secondary | ICD-10-CM

## 2018-02-18 LAB — CBC
HEMATOCRIT: 33.6 % — AB (ref 36.0–46.0)
HEMOGLOBIN: 11.5 g/dL — AB (ref 12.0–15.0)
MCHC: 34.1 g/dL (ref 30.0–36.0)
MCV: 90.2 fl (ref 78.0–100.0)
PLATELETS: 163 10*3/uL (ref 150.0–400.0)
RBC: 3.72 Mil/uL — ABNORMAL LOW (ref 3.87–5.11)
RDW: 14 % (ref 11.5–15.5)
WBC: 6.2 10*3/uL (ref 4.0–10.5)

## 2018-02-18 LAB — SEDIMENTATION RATE: Sed Rate: 29 mm/h (ref 0–30)

## 2018-02-18 NOTE — Progress Notes (Signed)
HPI:  Using dictation device. Unfortunately this device frequently misinterprets words/phrases.  Acute visit for skin lesions on the legs: -started 4-5 weeks ago -painful red blotches on legs -now less reds but some turning to bruises -no fevers, malaise, bleeding, GI symptoms, sore throat, rash elsewhere, recent illness, cough, SOB, wt loss or other symptoms, new medications or travel  ROS: See pertinent positives and negatives per HPI.  Past Medical History:  Diagnosis Date  . Benign paroxysmal positional vertigo 06/25/2013   dx with prior PCP, s/p labs, mri and vestibular rehab; nasal spray for etd seemed to help  . Blood in stool 03/11/2012  . Dependent edema    Bilateral  . Depression   . GERD (gastroesophageal reflux disease)   . Hyperlipidemia   . Low back pain    sciatica; sees orthopedic specialist ([facility])for this and shoulder tendinitis  . Obesity, diabetes, and hypertension syndrome ([place])   . Osteoarthritis, knee    Bilateral, s/p 3 total knee replacement surgerues on right, last 2005. Dr. Tonita Cong  . Pellegrini-Steida syndrome    Myositis ossificans  . Seasonal allergies   . Shingles Jul 10 2012   LESIONS CLEARED BUT NOT TOLERATING ANY THING TIGHT AGAINST BODY--SHINGLES WERE AROSS BACK AND ABDOMEN    Past Surgical History:  Procedure Laterality Date  . ABDOMINAL HYSTERECTOMY  1984  . COLONOSCOPY    . left shoulder steroid injection    . TONSILLECTOMY  1977  . TOTAL HIP ARTHROPLASTY Right 10/10/2012   Procedure: R TOTAL HIP ARTHROPLASTY ANTERIOR APPROACH;  Surgeon: Mcarthur Rossetti, MD;  [facility]: [facility];  Service: Orthopedics;  Laterality: Right;  Right Total Hip Arthroplasty, Anterior Approach (C-Arm)  . TOTAL KNEE ARTHROPLASTY Right    with multiple revisions, 2003, 2005  . TOTAL KNEE ARTHROPLASTY Left 01/09/2013   Procedure: LEFT TOTAL KNEE ARTHROPLASTY;  Surgeon: Mcarthur Rossetti, MD;  [facility];  Service: Orthopedics;  Laterality:  Left;  . TUBAL LIGATION  1978  . WISDOM TOOTH EXTRACTION  2013,2014    Family History  Problem Relation Age of Onset  . Diabetes Mother   . Hypertension Mother   . Hypertension Father   . Cancer Father        Lung CA  . Coronary artery disease Father   . Colon cancer Neg Hx     SOCIAL HX: see hpi   Current Outpatient Medications:  .  aspirin 81 MG tablet, Take 81 mg by mouth daily., Disp: , Rfl:  .  Blood Glucose Monitoring Suppl (ONETOUCH VERIO IQ SYSTEM) W/DEVICE KIT, by Does not apply route., Disp: , Rfl:  .  cetirizine (ZYRTEC) 10 MG tablet, Take 10 mg by mouth at bedtime., Disp: , Rfl:  .  fluticasone (FLONASE) 50 MCG/ACT nasal spray, SHAKE LIQUID AND USE 2 SPRAYS IN EACH NOSTRIL DAILY, Disp: 16 g, Rfl: 5 .  furosemide (LASIX) 40 MG tablet, TAKE 1/2 TABLET(20 MG) BY MOUTH DAILY, Disp: 30 tablet, Rfl: 3 .  furosemide (LASIX) 40 MG tablet, TAKE 1/2 TABLET(20 MG) BY MOUTH DAILY, Disp: 30 tablet, Rfl: 5 .  gabapentin (NEURONTIN) 300 MG capsule, TAKE 1 CAPSULE(300 MG) BY MOUTH TWICE DAILY, Disp: 180 capsule, Rfl: 2 .  glipiZIDE (GLUCOTROL) 5 MG tablet, TAKE 1 TABLET(5 MG) BY MOUTH TWICE DAILY BEFORE A MEAL, Disp: 60 tablet, Rfl: 5 .  glucose blood (ONETOUCH VERIO) test strip, USE AS DIRECTED TO TEST BLOOD SUGAR ONCE DAILY, Disp: 100 each, Rfl: 3 .  glucose blood test strip,  Use as directed to check blood sugar once a day, Disp: 100 each, Rfl: 3 .  lisinopril (PRINIVIL,ZESTRIL) 20 MG tablet, TAKE 1 TABLET BY MOUTH EVERY DAY, Disp: 90 tablet, Rfl: 1 .  metFORMIN (GLUCOPHAGE) 1000 MG tablet, TAKE 1 TABLET BY MOUTH TWICE DAILY WITH A MEAL, Disp: 180 tablet, Rfl: 1 .  omeprazole (PRILOSEC) 10 MG capsule, Take 10 mg by mouth daily as needed., Disp: , Rfl:  .  pravastatin (PRAVACHOL) 40 MG tablet, TAKE 1 AND 1/2 TABLETS(60 MG) BY MOUTH DAILY, Disp: 135 tablet, Rfl: 1  EXAM:  Vitals:   02/18/18 1331  BP: 128/72  Pulse: 67  Temp: 98.6 F (37 C)  SpO2: 99%    Body mass index is  28.35 kg/m.  GENERAL: vitals reviewed and listed above, alert, oriented, appears well hydrated and in no acute distress  HEENT: atraumatic, conjunttiva clear, no obvious abnormalities on inspection of external nose and ears  NECK: no obvious masses on inspection  LUNGS: clear to auscultation bilaterally, no wheezes, rales or rhonchi, good air movement  CV: HRRR, no peripheral edema  MS: moves all extremities without noticeable abnormality  SKIN: small circular and oval shaped bruises and patches of erythema on the legs approx 2-5 cm in diameter  PSYCH: pleasant and cooperative, no obvious depression or anxiety  ASSESSMENT AND PLAN:  Discussed the following assessment and plan:  Skin lesions - Plan: CBC, Sedimentation Rate, Antistreptolysin O titer, QuantiFERON-TB Gold Plus, DG Chest 2 View, Ambulatory referral to Dermatology  -we discussed possible serious and likely etiologies, workup and treatment, treatment risks and return precautions - query later stages of erythema nodosum - resolving, she doesn't have pics of initial lesions, but she thinks they were redder and more raise which would be c/w EN -after this discussion, Renee Watts opted for labs per orders, derm in case other and definitive dx if persists/possible biopsy -follow up advised 1 month -of course, we advised [place]  to return or notify a doctor immediately if symptoms worsen or persist or new concerns arise.    Patient Instructions  BEFORE YOU LEAVE: -xray sheet -labs -follow up: 1 month  Get xray  We have ordered labs or studies at this visit. It can take up to 1-2 weeks for results and processing. IF results require follow up or explanation, we will call you with instructions. Clinically stable results will be released to your [facility]. If you have not heard from [place] or cannot find your results in [facility] in 2 weeks please contact our office at [phone].  If you are not yet signed up for [facility], please  consider signing up.  -We placed a referral for you as discussed to dermatology. It usually takes about 1-2 weeks to process and schedule this referral. If you have not heard from [place] regarding this appointment in 2 weeks please contact our office.          Lucretia Kern, DO

## 2018-02-18 NOTE — Patient Instructions (Addendum)
BEFORE YOU LEAVE: -xray sheet -labs -follow up: 1 month  Get xray  We have ordered labs or studies at this visit. It can take up to 1-2 weeks for results and processing. IF results require follow up or explanation, we will call you with instructions. Clinically stable results will be released to your [facility]. If you have not heard from [place] or cannot find your results in [facility] in 2 weeks please contact our office at [phone].  If you are not yet signed up for [facility], please consider signing up.  -We placed a referral for you as discussed to dermatology. It usually takes about 1-2 weeks to process and schedule this referral. If you have not heard from [place] regarding this appointment in 2 weeks please contact our office.  I hope you are feeling better soon! Seek care promptly if your symptoms worsen, new concerns arise or you are not improving with treatment.

## 2018-02-20 LAB — QUANTIFERON-TB GOLD PLUS
Mitogen-NIL: >10 IU/mL
NIL: 5.9 [IU]/mL
QuantiFERON-TB Gold Plus: POSITIVE — AB
TB2-NIL: 1.85 IU/mL

## 2018-02-20 LAB — ANTISTREPTOLYSIN O TITER: ASO: <50 IU/mL (ref <200)

## 2018-02-20 NOTE — Addendum Note (Signed)
Addended by: Agnes Lawrence on: 02/20/2018 06:03 PM   Modules accepted: Orders

## 2018-02-20 NOTE — Addendum Note (Signed)
Addended by: Agnes Lawrence on: 02/20/2018 06:12 PM   Modules accepted: Orders

## 2018-02-21 ENCOUNTER — Ambulatory Visit (INDEPENDENT_AMBULATORY_CARE_PROVIDER_SITE_OTHER)
Admission: RE | Admit: 2018-02-21 | Discharge: 2018-02-21 | Disposition: A | Discharge status: Home / Self Care | Payer: PPO | Source: Ambulatory Visit | Attending: Family Medicine | Admitting: Family Medicine

## 2018-02-21 DIAGNOSIS — L989 Disorder of the skin and subcutaneous tissue, unspecified: Secondary | ICD-10-CM | POA: Diagnosis not present

## 2018-02-21 DIAGNOSIS — I10 Essential (primary) hypertension: Secondary | ICD-10-CM | POA: Diagnosis not present

## 2018-02-25 ENCOUNTER — Encounter: Payer: Self-pay | Admitting: Family Medicine

## 2018-03-10 ENCOUNTER — Other Ambulatory Visit: Payer: Self-pay | Admitting: Family Medicine

## 2018-03-10 DIAGNOSIS — Z1231 Encounter for screening mammogram for malignant neoplasm of breast: Secondary | ICD-10-CM

## 2018-03-13 ENCOUNTER — Ambulatory Visit
Admission: RE | Admit: 2018-03-13 | Discharge: 2018-03-13 | Disposition: A | Discharge status: Home / Self Care | Payer: PPO | Source: Ambulatory Visit | Attending: Family Medicine | Admitting: Family Medicine

## 2018-03-13 DIAGNOSIS — Z1231 Encounter for screening mammogram for malignant neoplasm of breast: Secondary | ICD-10-CM | POA: Diagnosis not present

## 2018-03-19 ENCOUNTER — Ambulatory Visit (INDEPENDENT_AMBULATORY_CARE_PROVIDER_SITE_OTHER): Payer: PPO

## 2018-03-19 ENCOUNTER — Ambulatory Visit (INDEPENDENT_AMBULATORY_CARE_PROVIDER_SITE_OTHER): Payer: PPO | Admitting: Orthopaedic Surgery

## 2018-03-19 ENCOUNTER — Ambulatory Visit (INDEPENDENT_AMBULATORY_CARE_PROVIDER_SITE_OTHER): Payer: Self-pay

## 2018-03-19 ENCOUNTER — Encounter (INDEPENDENT_AMBULATORY_CARE_PROVIDER_SITE_OTHER): Payer: Self-pay | Admitting: Orthopaedic Surgery

## 2018-03-19 ENCOUNTER — Other Ambulatory Visit (INDEPENDENT_AMBULATORY_CARE_PROVIDER_SITE_OTHER): Payer: Self-pay

## 2018-03-19 DIAGNOSIS — Z96653 Presence of artificial knee joint, bilateral: Secondary | ICD-10-CM | POA: Diagnosis not present

## 2018-03-19 DIAGNOSIS — M25562 Pain in left knee: Secondary | ICD-10-CM | POA: Diagnosis not present

## 2018-03-19 DIAGNOSIS — G8929 Other chronic pain: Secondary | ICD-10-CM | POA: Diagnosis not present

## 2018-03-19 DIAGNOSIS — M25561 Pain in right knee: Secondary | ICD-10-CM

## 2018-03-19 DIAGNOSIS — M79604 Pain in right leg: Secondary | ICD-10-CM

## 2018-03-19 DIAGNOSIS — M79605 Pain in left leg: Principal | ICD-10-CM

## 2018-03-19 NOTE — Progress Notes (Signed)
Office Visit Note   Patient: Renee Watts           Date of Birth: 06-19-1951           MRN: [id number] Visit Date: 03/19/2018              Requested by: Lucretia Kern, DO [address] [place], [place] [address] PCP: Lucretia Kern, DO   Assessment & Plan: Visit Diagnoses:  1. Chronic pain of both knees   2. History of bilateral knee replacement     Plan: I do feel the most appropriate treatment for her right now would be outpatient physical therapy to really work on her balance and coordination as well as gait training with bilateral lower extremity aggressive strengthening.  She would probably also benefit from core strengthening as well.  She agrees to try outpatient physical therapy.  All question concerns were answered and addressed.  We will see her back in about 6 weeks to see how she is doing overall.  Follow-Up Instructions: Return in about 6 weeks (around 04/30/2018).   Orders:  Orders Placed This Encounter  Procedures  . XR Knee 1-2 Views Left  . XR Knee 1-2 Views Right   No orders of the defined types were placed in this encounter.     Procedures: No procedures performed   Clinical Data: No additional findings.   Subjective: Chief Complaint  Patient presents with  . Right Leg - Pain  . Left Leg - Pain  The patient's only seen before.  We performed a left total knee arthroplasty on her and a right total hip arthroplasty on her.  She is also had a right total knee arthroplasty done remotely by someone who is already retired.  She ablates with a cane.  She is 67 years old.  She does have peripheral neuropathy from diabetes but is under good diabetic control.  Her biggest complaint is bilateral leg weakness.  She denies any significant pain but says the legs are just weak overall.  She does use a cane when she ambulates.  She has trouble getting up out of a chair as well.  She says both her knees seem to doing well in terms of no swelling in the do not  feel unstable to her but she just feels weak overall with her legs.  HPI  Review of Systems She currently denies any headache, chest pain, shortness of breath, fever, chills, nausea, vomiting.  She is alert and oriented x3 and in no acute distress  Objective: Vital Signs: There were no vitals taken for this visit.  Physical Exam She is alert and oriented x3 and in no acute distress Ortho Exam On examination she does have a hard time getting up from a chair onto the exam table.  She does have neuropathy in both her feet but her feet are well-perfused.  Neither knee has any swelling.  Both knees feel he mostly stable with good range of motion.  Her quad muscles do seem to be weak bilaterally.  Her lower legs do not have any significant weakness however she does have some weakness in her feet and ankles. Specialty Comments:  No specialty comments available.  Imaging: Xr Knee 1-2 Views Left  Result Date: 03/19/2018 2 views of the left knee show a total knee arthroplasty with no complicating features.  There is no evidence of loosening or ostial lysis  Xr Knee 1-2 Views Right  Result Date: 03/19/2018 2 views of the  right knee show total knee arthroplasty with no complicating features.    PMFS History: Patient Active Problem List   Diagnosis Date Noted  . Obesity 01/06/2016  . Low back pain 01/06/2016  . Diabetic peripheral neuropathy ([place]) 09/01/2012  . Hyperlipemia 09/16/2006  . Essential hypertension 09/16/2006  . GERD 09/16/2006  . Type II diabetes mellitus with neurological manifestations ([place]) 06/17/2006  . Osteoarthritis 06/17/2006  . DEPENDENT EDEMA, LEGS, BILATERAL 06/17/2006   Past Medical History:  Diagnosis Date  . Benign paroxysmal positional vertigo 06/25/2013   dx with prior PCP, s/p labs, mri and vestibular rehab; nasal spray for etd seemed to help  . Blood in stool 03/11/2012  . Dependent edema    Bilateral  . Depression   . GERD (gastroesophageal reflux  disease)   . Hyperlipidemia   . Low back pain    sciatica; sees orthopedic specialist ([facility])for this and shoulder tendinitis  . Obesity, diabetes, and hypertension syndrome ([place])   . Osteoarthritis, knee    Bilateral, s/p 3 total knee replacement surgerues on right, last 2005. Dr. Tonita Cong  . Pellegrini-Steida syndrome    Myositis ossificans  . Seasonal allergies   . Shingles Jul 10 2012   LESIONS CLEARED BUT NOT TOLERATING ANY THING TIGHT AGAINST BODY--SHINGLES WERE AROSS BACK AND ABDOMEN    Family History  Problem Relation Age of Onset  . Diabetes Mother   . Hypertension Mother   . Hypertension Father   . Cancer Father        Lung CA  . Coronary artery disease Father   . Colon cancer Neg Hx     Past Surgical History:  Procedure Laterality Date  . ABDOMINAL HYSTERECTOMY  1984  . COLONOSCOPY    . left shoulder steroid injection    . TONSILLECTOMY  1977  . TOTAL HIP ARTHROPLASTY Right 10/10/2012   Procedure: R TOTAL HIP ARTHROPLASTY ANTERIOR APPROACH;  Surgeon: Mcarthur Rossetti, MD;  Location: [facility];  Service: Orthopedics;  Laterality: Right;  Right Total Hip Arthroplasty, Anterior Approach (C-Arm)  . TOTAL KNEE ARTHROPLASTY Right    with multiple revisions, 2003, 2005  . TOTAL KNEE ARTHROPLASTY Left 01/09/2013   Procedure: LEFT TOTAL KNEE ARTHROPLASTY;  Surgeon: Mcarthur Rossetti, MD;  Location: [facility];  Service: Orthopedics;  Laterality: Left;  . TUBAL LIGATION  1978  . WISDOM TOOTH EXTRACTION  2013,2014   Social History   Occupational History    Employer: [employer]  Tobacco Use  . Smoking status: Never Smoker  . Smokeless tobacco: Never Used  Substance and Sexual Activity  . Alcohol use: No  . Drug use: No  . Sexual activity: Not on file

## 2018-04-03 ENCOUNTER — Ambulatory Visit: Payer: PPO | Admitting: Family Medicine

## 2018-04-03 ENCOUNTER — Ambulatory Visit: Payer: PPO

## 2018-04-08 ENCOUNTER — Encounter: Payer: Self-pay | Admitting: Physical Therapy

## 2018-04-08 ENCOUNTER — Ambulatory Visit: Payer: PPO | Admitting: Family Medicine

## 2018-04-08 ENCOUNTER — Other Ambulatory Visit: Payer: Self-pay

## 2018-04-08 ENCOUNTER — Ambulatory Visit: Payer: PPO | Attending: Orthopaedic Surgery | Admitting: Physical Therapy

## 2018-04-08 DIAGNOSIS — M25662 Stiffness of left knee, not elsewhere classified: Secondary | ICD-10-CM | POA: Insufficient documentation

## 2018-04-08 DIAGNOSIS — R209 Unspecified disturbances of skin sensation: Secondary | ICD-10-CM | POA: Diagnosis not present

## 2018-04-08 DIAGNOSIS — M6281 Muscle weakness (generalized): Secondary | ICD-10-CM | POA: Diagnosis not present

## 2018-04-08 DIAGNOSIS — M25661 Stiffness of right knee, not elsewhere classified: Secondary | ICD-10-CM | POA: Insufficient documentation

## 2018-04-08 DIAGNOSIS — R2689 Other abnormalities of gait and mobility: Secondary | ICD-10-CM | POA: Insufficient documentation

## 2018-04-08 NOTE — Patient Instructions (Signed)
Access Code: [email or web address]  URL: [email or web address]  Date: 04/08/2018  Prepared by: Raeford Razor   Exercises  Seated March - 10 reps - 2 sets - 5 hold - 1x daily - 7x weekly  Seated Long Arc Quad - 10 reps - 2 sets - 5 hold - 1x daily - 7x weekly  Heel Raises with Counter Support - 10 reps - 2 sets - 5 hold - 1x daily - 7x weekly  Supine Bridge - 10 reps - 2 sets - 5 hold - 1x daily - 7x weekly

## 2018-04-08 NOTE — Therapy (Signed)
[place], [place], [address] Phone: [phone]   Fax:  [phone]  Physical Therapy Evaluation  Patient Details  Name: Renee Watts MRN: [id number] Date of Birth: 1951-07-27 Referring Provider: Dr. Jean Rosenthal   Encounter Date: 04/08/2018  PT End of Session - 04/08/18 1014    Visit Number  1    Number of Visits  16    Date for PT Re-Evaluation  06/03/18    PT Start Time  0934    PT Stop Time  1017    PT Time Calculation (min)  43 min    Activity Tolerance  Patient tolerated treatment well    Behavior During Therapy  [facility] for tasks assessed/performed       Past Medical History:  Diagnosis Date  . Benign paroxysmal positional vertigo 06/25/2013   dx with prior PCP, s/p labs, mri and vestibular rehab; nasal spray for etd seemed to help  . Blood in stool 03/11/2012  . Dependent edema    Bilateral  . Depression   . GERD (gastroesophageal reflux disease)   . Hyperlipidemia   . Low back pain    sciatica; sees orthopedic specialist ([facility])for this and shoulder tendinitis  . Obesity, diabetes, and hypertension syndrome ([place])   . Osteoarthritis, knee    Bilateral, s/p 3 total knee replacement surgerues on right, last 2005. Dr. Tonita Cong  . Pellegrini-Steida syndrome    Myositis ossificans  . Seasonal allergies   . Shingles Jul 10 2012   LESIONS CLEARED BUT NOT TOLERATING ANY THING TIGHT AGAINST BODY--SHINGLES WERE AROSS BACK AND ABDOMEN    Past Surgical History:  Procedure Laterality Date  . ABDOMINAL HYSTERECTOMY  1984  . COLONOSCOPY    . left shoulder steroid injection    . TONSILLECTOMY  1977  . TOTAL HIP ARTHROPLASTY Right 10/10/2012   Procedure: R TOTAL HIP ARTHROPLASTY ANTERIOR APPROACH;  Surgeon: Mcarthur Rossetti, MD;  Location: [facility];  Service: Orthopedics;  Laterality: Right;  Right Total Hip Arthroplasty, Anterior Approach (C-Arm)  . TOTAL KNEE ARTHROPLASTY Right     with multiple revisions, 2003, 2005  . TOTAL KNEE ARTHROPLASTY Left 01/09/2013   Procedure: LEFT TOTAL KNEE ARTHROPLASTY;  Surgeon: Mcarthur Rossetti, MD;  Location: [facility];  Service: Orthopedics;  Laterality: Left;  . TUBAL LIGATION  1978  . WISDOM TOOTH EXTRACTION  2013,2014    There were no vitals filed for this visit.   Subjective Assessment - 04/08/18 0938    Subjective  Patient has been complaining of bilateral LE weakness for about 18 mos, both equally affected.  She needs to walk with a cane when in the community.  She has been dIgnosed with PN but MD does ont feel it is due to this.  She has trouble standing from a low surface, car transfers. She has to walk slowly and lacks confidence with her balance.  Knees feel like buckling.  Sensory changes in lower legs and increases mostly in PM     Pertinent History  Rt THA 2014 , Rt TKA 2003 with revisions , Lt TKA 2014, diabetes, peripheral neuropathy, low back pain    Limitations  Lifting;Standing;Walking;House hold activities    How long can you stand comfortably?  if she is cooking she can do >1 hour     How long can you walk comfortably?  not sure     Diagnostic tests  XR recent normal    Patient Stated Goals  Wants to  be able to walk without cane     Currently in Pain?  Yes    Pain Score  1     Pain Location  Leg    Pain Orientation  Right;Left;Lower    Pain Descriptors / Indicators  Tingling    Pain Type  Chronic pain    Pain Radiating Towards  thighs and lower legs     Pain Onset  More than a month ago   not necessarily pain    Pain Frequency  Several days a week    Aggravating Factors   walking , bending , stooping     Pain Relieving Factors  rest, Tylenol    Effect of Pain on Daily Activities  limits mobility          [facility] PT Assessment - 04/08/18 0001      Assessment   Medical Diagnosis  bilateral LE weakness    Referring Provider  Dr. Jean Rosenthal    Onset Date/Surgical Date  --   18 mos     Prior Therapy  Yes for surgeries (knee)       Precautions   Precautions  None      Restrictions   Weight Bearing Restrictions  No      Balance Screen   Has the patient fallen in the past 6 months  No    Has the patient had a decrease in activity level because of a fear of falling?   Yes    Is the patient reluctant to leave their home because of a fear of falling?   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      Cognition   Overall Cognitive Status  Within Functional Limits for tasks assessed      Observation/Other Assessments   Focus on Therapeutic Outcomes (FOTO)   46%      Observation/Other Assessments-Edema    Edema  --   not measured but present in bilateral LEs      Sensation   Light Touch  Impaired by gross assessment    Additional Comments  reports in bilateral LEs  and feet, neuropathy      Coordination   Gross Motor Movements are Fluid and Coordinated  Not tested      Posture/Postural Control   Posture/Postural Control  Postural limitations    Postural Limitations  Rounded Shoulders;Forward head;Flexed trunk    Posture Comments  not remarkable, mild limitations above       AROM   Right Knee Extension  4    Right Knee Flexion  86    Left Knee Extension  0    Left Knee Flexion  90    Right Ankle Dorsiflexion  -5    Right Ankle Plantar Flexion  40    Left Ankle Dorsiflexion  -5    Left Ankle Plantar Flexion  25      PROM   Overall PROM Comments  hip flexion to 100 deg, tight in hip ER and IR , no knee pain  or hip pain       Strength   Right Hip Flexion  3-/5    Right Hip ABduction  4-/5    Left Hip Flexion  4-/5    Left Hip ABduction  4-/5    Right Knee Flexion  3+/5    Right Knee Extension  3+/5    Left Knee Flexion  4+/5    Left Knee Extension  4+/5    Right Ankle Dorsiflexion  3/5    Right Ankle Plantar Flexion  3/5    Right Ankle Inversion  3+/5    Right Ankle Eversion  3/5    Left Ankle Dorsiflexion  3/5    Left Ankle Plantar Flexion  3/5    Left Ankle Inversion  3+/5    Left Ankle Eversion  3/5      Flexibility   Hamstrings  min tightness       Palpation   Palpation comment  non tender in bilateral LE's       Transfers   Transfers  Sit to Stand;Supine to Sit;Sit to Supine    Sit to Stand  6: Modified independent (Device/Increase time)   increased effort from chair    Supine to Sit  6: Modified independent (Device/Increase time)    Sit to Supine  6: Modified independent (Device/Increase time)      Ambulation/Gait   Ambulation Distance (Feet)  150 Feet    Assistive device  Straight cane    Gait Pattern  Step-through pattern;Decreased arm swing - right;Decreased arm swing - left;Decreased step length - right;Decreased step length - left;Decreased dorsiflexion - right;Decreased dorsiflexion - left;Antalgic    Ambulation Surface  Level;Indoor    Gait velocity  slow did not measure    Gait Comments  looks at feet for security                Objective measurements completed on examination: See above findings.              PT Education - 04/08/18 1029    Education Details  PT/POC, HEP, weakness     Person(s) Educated  Patient    Methods  Explanation;Demonstration;Verbal cues;Handout    Comprehension  Verbalized understanding;Returned demonstration;Need further instruction       PT Short Term Goals - 04/08/18 1030      PT SHORT TERM GOAL #1   Title  Pt will be I with HEP for basic LE strengthening     Time  4    Period  Weeks    Status  New     Target Date  05/06/18      PT SHORT TERM GOAL #2   Title  Pt will increase strength in LEs by 1/2 muscle grade in ankle DF, PF and hips    Time  4    Period  Weeks    Status  New    Target Date  05/06/18      PT SHORT TERM GOAL #3   Title  Pt will be screened for Balance and set goal for mobility.     Time  4    Period  Weeks    Status  New    Target Date  05/06/18      PT SHORT TERM GOAL #4   Title  Pt will tolerate 15 min of bike/NuStep without significant soreness following.     Time  4    Period  Weeks    Status  New    Target Date  05/06/18        PT Long Term Goals - 04/08/18 1123      PT LONG TERM GOAL #1   Title  Pt will be I with final HEP for flexibility and strength     Time  8    Period  Weeks    Status  New    Target Date  06/03/18      PT LONG TERM GOAL #2   Title  Pt will be able to improve ease of transfers (car, chair) by 50% as reported by patient.     Time  8    Period  Weeks    Status  New    Target Date  06/03/18      PT LONG TERM GOAL #3   Title  Pt will improve FOTO to less than 38% to show functional improvement     Time  8    Period  Weeks    Status  New    Target Date  06/03/18      PT LONG TERM GOAL #4   Title  Balance/mobility goal to TBA     Time  8    Period  Weeks    Status  New    Target Date  06/03/18      PT LONG TERM GOAL #5   Title  Pt will be able to stand, squat, stoop and bend for improved childcare in her home with less difficulty, pain.     Time  8    Period  Weeks    Status  New    Target Date  06/03/18             Plan - 04/08/18 1136    Clinical Impression Statement  Patient presents with bilateral LE weakness which she describes as worsening over the past year.  She has significant weakness in ankles, knees and hips (3/5 to 3+/5 in general) .  Her knee AROM is only about 90 deg bilaterally.  She walks very slowly and lacks confidence with unfamiliar areas in the community, uneven surfaces. She has  peripheral neuropathy and I feel that this does play a role in her ankle weakness. She will benefit from skilled PT to improve functional mobility and safety, strength in LE and core.      Clinical Presentation  Evolving    Clinical Decision Making  Moderate    Rehab Potential  Good    PT Frequency  2x / week    PT Duration  8 weeks    PT Treatment/Interventions  ADLs/Self Care Home Management;Electrical Stimulation;Functional mobility training;Neuromuscular re-education;Cryotherapy;Ultrasound;Moist Heat;Patient/family education;Therapeutic exercise;Manual techniques;Passive range of motion;Balance training;Gait training;DME Instruction;Therapeutic activities    PT Next Visit Plan  balance screen (sit to stand x 5, TUG, [place]), check HEP , try Nustep     PT Home Exercise Plan  bridging, LAQ, seated march, standing heel raises     Consulted and Agree with Plan of Care  Patient       Patient will benefit from skilled therapeutic intervention in order to improve the following deficits and impairments:  Abnormal gait, Decreased balance, Decreased endurance, Decreased mobility, Difficulty walking, Impaired sensation, Pain, Postural dysfunction, Impaired UE functional use, Impaired flexibility, Increased fascial restricitons, Decreased strength, Decreased activity tolerance, Decreased range of motion, Increased edema  Visit Diagnosis: Muscle weakness (generalized)  Stiffness of left knee, not  elsewhere classified  Stiffness of right knee, not elsewhere classified  Other abnormalities of gait and mobility  Sensory disturbance     Problem List Patient Active Problem List   Diagnosis Date Noted  . Obesity 01/06/2016  . Low back pain 01/06/2016  . Diabetic peripheral neuropathy ([place]) 09/01/2012  . Hyperlipemia 09/16/2006  . Essential hypertension 09/16/2006  . GERD 09/16/2006  . Type II diabetes mellitus with neurological manifestations ([place]) 06/17/2006  . Osteoarthritis 06/17/2006  .  DEPENDENT EDEMA, LEGS, BILATERAL 06/17/2006    Shanty Ginty 04/08/2018, 11:52 AM  [facility] [address] [place], [place], [address] Phone: [phone]   Fax:  [phone]  Name: Renee Watts MRN: [id number] Date of Birth: [birth year]   Raeford Razor, PT 04/08/18 12:00 PM Phone: [phone] Fax: [phone]

## 2018-04-12 NOTE — Progress Notes (Signed)
Subjective:   Renee Watts is a 67 y.o. female who presents for Medicare Annual (Subsequent) preventive examination.  Reports health as Apt with Dr. Maudie Mercury 10/45 Obesity, DM,  Started therapy for lower leg weakness Right leg is weaker   Diet Has been Diabetic x 16 years  BMI 28  In general , BS are running 125 in the am Holding glipizide for now   Chol/ hdl 4 from 3, HDL dropped to 36 Breakfast most am she has bacon, egg with cheese; applesauce or jelly Lunch; likes to eat a salad; grilled chicken Dressing: Olive garden Supper - turnip greens; had stew beef and potatoes Meal with vegetables   Exercise Can't exercise due to knee and weakness Was walking 30 to 40 minutes but now can't do 10  She liked speed walking   Health Maintenance Due  Topic Date Due  . HEMOGLOBIN A1C  03/13/2018  . LIPID PANEL  04/12/2018  . INFLUENZA VACCINE  03/27/2018  . FOOT EXAM  04/12/2018     Eye exam 07/2017 Mammogram 7.2019 Bone density 11/2015  -1.0  Colonoscopy - due 10/2018         Objective:     Vitals: BP 118/60   Pulse 68   Ht _0  (1.676 m)   Wt 178 lb 8 oz (81 kg)   BMI 28.81 kg/m   Body mass index is 28.81 kg/m.  Advanced Directives 04/08/2018 04/12/2017 01/09/2013 01/09/2013 01/05/2013 10/10/2012 10/01/2012  Does Patient Have a Medical Advance Directive? Yes No Patient does not have advance directive;Patient would not like information - Patient does not have advance directive;Patient would not like information Patient does not have advance directive;Patient would not like information Patient does not have advance directive;Patient would not like information  Type of Advance Directive [place]  Does patient want to make changes to medical advance directive? No - Patient declined - - - - - -  Pre-existing out of facility DNR order (yellow form or pink MOST form) - - No No No No -   Spouse brought forms home last week   Tobacco Social  History   Tobacco Use  Smoking Status Never Smoker  Smokeless Tobacco Never Used     Counseling given: Yes   Clinical Intake:     Past Medical History:  Diagnosis Date  . Benign paroxysmal positional vertigo 06/25/2013   dx with prior PCP, s/p labs, mri and vestibular rehab; nasal spray for etd seemed to help  . Blood in stool 03/11/2012  . Dependent edema    Bilateral  . Depression   . GERD (gastroesophageal reflux disease)   . Hyperlipidemia   . Low back pain    sciatica; sees orthopedic specialist ([facility])for this and shoulder tendinitis  . Obesity, diabetes, and hypertension syndrome ([place])   . Osteoarthritis, knee    Bilateral, s/p 3 total knee replacement surgerues on right, last 2005. Dr. Tonita Cong  . Pellegrini-Steida syndrome    Myositis ossificans  . Seasonal allergies   . Shingles Jul 10 2012   LESIONS CLEARED BUT NOT TOLERATING ANY THING TIGHT AGAINST BODY--SHINGLES WERE AROSS BACK AND ABDOMEN   Past Surgical History:  Procedure Laterality Date  . ABDOMINAL HYSTERECTOMY  1984  . COLONOSCOPY    . left shoulder steroid injection    . TONSILLECTOMY  1977  . TOTAL HIP ARTHROPLASTY Right 10/10/2012   Procedure: R TOTAL HIP ARTHROPLASTY ANTERIOR APPROACH;  Surgeon: Lind Guest  Ninfa Linden, MD;  Location: [facility];  Service: Orthopedics;  Laterality: Right;  Right Total Hip Arthroplasty, Anterior Approach (C-Arm)  . TOTAL KNEE ARTHROPLASTY Right    with multiple revisions, 2003, 2005  . TOTAL KNEE ARTHROPLASTY Left 01/09/2013   Procedure: LEFT TOTAL KNEE ARTHROPLASTY;  Surgeon: Mcarthur Rossetti, MD;  Location: [facility];  Service: Orthopedics;  Laterality: Left;  . TUBAL LIGATION  1978  . WISDOM TOOTH EXTRACTION  2013,2014   Family History  Problem Relation Age of Onset  . Diabetes Mother   . Hypertension Mother   . Hypertension Father   . Cancer Father        Lung CA  . Coronary artery disease Father   . Colon cancer Neg Hx    Social History    Socioeconomic History  . Marital status: Married    Spouse name: Not on file  . Number of children: Not on file  . Years of education: 12th grade  . Highest education level: Not on file  Occupational History    Employer: [employer]  Social Needs  . Financial resource strain: Not on file  . Food insecurity:    Worry: Not on file    Inability: Not on file  . Transportation needs:    Medical: Not on file    Non-medical: Not on file  Tobacco Use  . Smoking status: Never Smoker  . Smokeless tobacco: Never Used  Substance and Sexual Activity  . Alcohol use: No  . Drug use: No  . Sexual activity: Not on file  Lifestyle  . Physical activity:    Days per week: Not on file    Minutes per session: Not on file  . Stress: Not on file  Relationships  . Social connections:    Talks on phone: Not on file    Gets together: Not on file    Attends religious service: Not on file    Active member of club or organization: Not on file    Attends meetings of clubs or organizations: Not on file    Relationship status: Not on file  Other Topics Concern  . Not on file  Social History Narrative   Married, 3 daughters.   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   Nicotine: never   ETOH: no   Drugs: no    Outpatient Encounter Medications as of 04/14/2018  Medication Sig  . aspirin 81 MG tablet Take 81 mg by mouth daily.  . Blood Glucose Monitoring Suppl (ONETOUCH VERIO IQ SYSTEM) W/DEVICE KIT by Does not apply route.  . cetirizine (ZYRTEC) 10 MG tablet Take 10 mg by mouth at bedtime.  . fluticasone (FLONASE) 50 MCG/ACT nasal spray SHAKE LIQUID AND USE 2 SPRAYS IN EACH NOSTRIL DAILY  . furosemide (LASIX) 40 MG tablet TAKE 1/2 TABLET(20 MG) BY MOUTH DAILY  . gabapentin (NEURONTIN) 300 MG capsule TAKE 1 CAPSULE(300 MG) BY MOUTH TWICE DAILY  . glipiZIDE (GLUCOTROL) 5 MG tablet TAKE 1 TABLET(5 MG) BY MOUTH TWICE DAILY BEFORE A MEAL  . glucose blood  (ONETOUCH VERIO) test strip USE AS DIRECTED TO TEST BLOOD SUGAR ONCE DAILY  . glucose blood test strip Use as directed to check blood sugar once a day  . omeprazole (PRILOSEC) 10 MG capsule Take 10 mg by mouth daily as needed.  . pravastatin (PRAVACHOL) 40 MG tablet TAKE 1 AND 1/2 TABLETS(60 MG) BY MOUTH DAILY  . [DISCONTINUED] furosemide (LASIX) 40 MG tablet TAKE 1/2  TABLET(20 MG) BY MOUTH DAILY  . [DISCONTINUED] lisinopril (PRINIVIL,ZESTRIL) 20 MG tablet TAKE 1 TABLET BY MOUTH EVERY DAY  . [DISCONTINUED] metFORMIN (GLUCOPHAGE) 1000 MG tablet TAKE 1 TABLET BY MOUTH TWICE DAILY WITH A MEAL   No facility-administered encounter medications on file as of 04/14/2018.     Activities of Daily Living No flowsheet data found.  Patient Care Team: Lucretia Kern, DO as PCP - General (Family Medicine) Plyler, Chauncey Reading, RD as Dietitian (Dietician) Ara Kussmaul, MD (Ophthalmology) Alden Hipp, MD as Consulting Physician (Obstetrics and Gynecology)    Assessment:   This is a routine wellness examination for [place].  Exercise Activities and Dietary recommendations    Goals    . Blood Pressure < 140/90    . Exercise 150 min/wk Moderate Activity     Get back to speed walking and balance     . HEMOGLOBIN A1C < 7.0    . LDL CALC < 100       Fall Risk Fall Risk  04/12/2017 04/12/2017 08/25/2015 07/07/2013 06/25/2013  Falls in the past year? _0   Comment - - - - -  Risk for fall due to : - - - - Impaired balance/gait  Risk for fall due to: Comment - - - - -     Depression Screen PHQ 2/9 Scores 04/12/2017 04/12/2017 08/25/2015 07/07/2013  PHQ - 2 Score 0 0 1 0  PHQ- 9 Score - - - -    Uncle has 6 months on mother's side   Cognitive Function MMSE - Mini Mental State Exam 04/12/2017  Not completed: (No Data)     Ad8 score reviewed for issues:  Issues making decisions:  Less interest in hobbies / activities:  Repeats questions, stories (family  complaining):  Trouble using ordinary gadgets (microwave, computer, phone):  Forgets the month or year:   Mismanaging finances:   Remembering appts:  Daily problems with thinking and/or memory: Ad8 score is=0        Immunization History  Administered Date(s) Administered  . Influenza Split 06/04/2011, 05/07/2012  . Influenza Whole 07/08/2007, 05/27/2008, 07/14/2009, 07/18/2010  . Influenza, High Dose Seasonal PF 05/10/2015, 05/10/2016, 08/26/2017  . Influenza,inj,Quad PF,6+ Mos 05/26/2013, 06/14/2014  . Influenza-Unspecified 05/27/2016  . Pneumococcal Conjugate-13 05/10/2015  . Pneumococcal Polysaccharide-23 07/18/2010, 05/10/2016  . Td 07/18/2010     Screening Tests Health Maintenance  Topic Date Due  . HEMOGLOBIN A1C  03/13/2018  . LIPID PANEL  04/12/2018  . INFLUENZA VACCINE  03/27/2018  . FOOT EXAM  04/12/2018  . OPHTHALMOLOGY EXAM  07/29/2018  . COLONOSCOPY  11/18/2018  . MAMMOGRAM  03/13/2020  . TETANUS/TDAP  07/18/2020  . Hepatitis C Screening  Completed  . PNA vac Low Risk Adult  Completed  . DEXA SCAN  Addressed        Plan:      PCP Notes   Health Maintenance Educated regarding the shingrix   Abnormal Screens  None  Referrals  none  Patient concerns; Has been assessed for PT for LLE extremity strength Also has pain rating of 7  Holding exercise due to LLE issues   Nurse Concerns; As noted   Next PCP apt / today        I have personally reviewed and noted the following in the patient's chart:   . Medical and social history . Use of alcohol, tobacco or illicit drugs  . Current medications and supplements . Functional ability and status . Nutritional status .  Physical activity . Advanced directives . List of other physicians . Hospitalizations, surgeries, and ER visits in previous 12 months . Vitals . Screenings to include cognitive, depression, and falls . Referrals and appointments  In addition, I have reviewed and  discussed with patient certain preventive protocols, quality metrics, and best practice recommendations. A written personalized care plan for preventive services as well as general preventive health recommendations were provided to patient.     Wynetta Fines, RN  04/14/2018

## 2018-04-14 ENCOUNTER — Ambulatory Visit (INDEPENDENT_AMBULATORY_CARE_PROVIDER_SITE_OTHER): Payer: PPO | Admitting: Family Medicine

## 2018-04-14 ENCOUNTER — Ambulatory Visit: Payer: PPO

## 2018-04-14 ENCOUNTER — Other Ambulatory Visit: Payer: Self-pay | Admitting: Family Medicine

## 2018-04-14 ENCOUNTER — Encounter: Payer: Self-pay | Admitting: Family Medicine

## 2018-04-14 VITALS — BP 118/60 | HR 68 | Ht 66.0 in | Wt 178.5 lb

## 2018-04-14 VITALS — BP 118/60 | HR 68 | Temp 98.9°F | Ht 66.0 in | Wt 178.0 lb

## 2018-04-14 DIAGNOSIS — I10 Essential (primary) hypertension: Secondary | ICD-10-CM

## 2018-04-14 DIAGNOSIS — E1140 Type 2 diabetes mellitus with diabetic neuropathy, unspecified: Secondary | ICD-10-CM | POA: Diagnosis not present

## 2018-04-14 DIAGNOSIS — E1159 Type 2 diabetes mellitus with other circulatory complications: Secondary | ICD-10-CM | POA: Diagnosis not present

## 2018-04-14 DIAGNOSIS — E785 Hyperlipidemia, unspecified: Secondary | ICD-10-CM | POA: Diagnosis not present

## 2018-04-14 DIAGNOSIS — D649 Anemia, unspecified: Secondary | ICD-10-CM

## 2018-04-14 DIAGNOSIS — I152 Hypertension secondary to endocrine disorders: Secondary | ICD-10-CM

## 2018-04-14 DIAGNOSIS — Z0000 Encounter for general adult medical examination without abnormal findings: Secondary | ICD-10-CM

## 2018-04-14 DIAGNOSIS — E1169 Type 2 diabetes mellitus with other specified complication: Secondary | ICD-10-CM | POA: Diagnosis not present

## 2018-04-14 DIAGNOSIS — E538 Deficiency of other specified B group vitamins: Secondary | ICD-10-CM

## 2018-04-14 LAB — FERRITIN: Ferritin: 52.8 ng/mL (ref 10.0–291.0)

## 2018-04-14 LAB — HEMOGLOBIN A1C: HEMOGLOBIN A1C: 6.4 % (ref 4.6–6.5)

## 2018-04-14 LAB — CBC
HCT: 36.1 % (ref 36.0–46.0)
Hemoglobin: 12.1 g/dL (ref 12.0–15.0)
MCHC: 33.5 g/dL (ref 30.0–36.0)
MCV: 90.8 fl (ref 78.0–100.0)
PLATELETS: 170 10*3/uL (ref 150.0–400.0)
RBC: 3.98 Mil/uL (ref 3.87–5.11)
RDW: 14.1 % (ref 11.5–15.5)
WBC: 5.4 10*3/uL (ref 4.0–10.5)

## 2018-04-14 LAB — VITAMIN B12: Vitamin B-12: 264 pg/mL (ref 211–911)

## 2018-04-14 LAB — HDL CHOLESTEROL: HDL: 42 mg/dL (ref >39.00)

## 2018-04-14 LAB — CHOLESTEROL, TOTAL: Cholesterol: 184 mg/dL (ref 0–200)

## 2018-04-14 NOTE — Progress Notes (Signed)
HPI:  Using dictation device. Unfortunately this device frequently misinterprets words/phrases.  Renee Watts is a pleasant 67 y.o. here for follow up. Chronic medical problems summarized below were reviewed for changes and stability and were updated as needed below. These issues and their treatment remain stable for the most part.  She is seeing ortho for some LE issues and is doing Physical Therapy.  Reports the rash is resolving on the L LE, but some lesion remain on the R LE and will be seeing dermatologist. Trying to eat healthier (though had ice cream sandwich for breakfast) and trying to do the PT for activity. Denies CP, SOB, DOE, treatment intolerance or new symptoms.  AWV was 04/14/18 (today) with Renee Watts DM:  -complications: neuropathy, microvascular disease -meds: metformin '1000mg'$  bid,glipizide,asa, acei, gabapentin -taking gabapentin bid  - reports rxd for a long time for her neuropathy  HLD/Obesity:  -meds: pravastatin   HTN/LE edema:  -meds: lisinopril, lasix   GERD:  -meds:omeprazole   Anemia: -chronic, intermittent -last colon 10/2013 with small benign cecal ulcers, several polyps - repeat colon advised in 5 yers -b12 ok in the past, ferritin ok last check - reordered recently for skin lesions and lower hgb - pending along with stool cards  ROS: See pertinent positives and negatives per HPI.  Past Medical History:  Diagnosis Date  . Benign paroxysmal positional vertigo 06/25/2013   dx with prior PCP, s/p labs, mri and vestibular rehab; nasal spray for etd seemed to help  . Blood in stool 03/11/2012  . Dependent edema    Bilateral  . Depression   . GERD (gastroesophageal reflux disease)   . Hyperlipidemia   . Low back pain    sciatica; sees orthopedic specialist ([facility])for this and shoulder tendinitis  . Obesity, diabetes, and hypertension syndrome ([place])   . Osteoarthritis, knee    Bilateral, s/p 3 total knee replacement surgerues on  right, last 2005. Dr. Tonita Cong  . Pellegrini-Steida syndrome    Myositis ossificans  . Seasonal allergies   . Shingles Jul 10 2012   LESIONS CLEARED BUT NOT TOLERATING ANY THING TIGHT AGAINST BODY--SHINGLES WERE AROSS BACK AND ABDOMEN    Past Surgical History:  Procedure Laterality Date  . ABDOMINAL HYSTERECTOMY  1984  . COLONOSCOPY    . left shoulder steroid injection    . TONSILLECTOMY  1977  . TOTAL HIP ARTHROPLASTY Right 10/10/2012   Procedure: R TOTAL HIP ARTHROPLASTY ANTERIOR APPROACH;  Surgeon: Mcarthur Rossetti, MD;  Location: [facility];  Service: Orthopedics;  Laterality: Right;  Right Total Hip Arthroplasty, Anterior Approach (C-Arm)  . TOTAL KNEE ARTHROPLASTY Right    with multiple revisions, 2003, 2005  . TOTAL KNEE ARTHROPLASTY Left 01/09/2013   Procedure: LEFT TOTAL KNEE ARTHROPLASTY;  Surgeon: Mcarthur Rossetti, MD;  Location: [facility];  Service: Orthopedics;  Laterality: Left;  . TUBAL LIGATION  1978  . WISDOM TOOTH EXTRACTION  2013,2014    Family History  Problem Relation Age of Onset  . Diabetes Mother   . Hypertension Mother   . Hypertension Father   . Cancer Father        Lung CA  . Coronary artery disease Father   . Colon cancer Neg Hx     SOCIAL HX: see hpi   Current Outpatient Medications:  .  aspirin 81 MG tablet, Take 81 mg by mouth daily., Disp: , Rfl:  .  Blood Glucose Monitoring Suppl (ONETOUCH VERIO IQ SYSTEM) W/DEVICE KIT, by Does not apply route.,  Disp: , Rfl:  .  cetirizine (ZYRTEC) 10 MG tablet, Take 10 mg by mouth at bedtime., Disp: , Rfl:  .  fluticasone (FLONASE) 50 MCG/ACT nasal spray, SHAKE LIQUID AND USE 2 SPRAYS IN EACH NOSTRIL DAILY, Disp: 16 g, Rfl: 5 .  furosemide (LASIX) 40 MG tablet, TAKE 1/2 TABLET(20 MG) BY MOUTH DAILY, Disp: 30 tablet, Rfl: 5 .  gabapentin (NEURONTIN) 300 MG capsule, TAKE 1 CAPSULE(300 MG) BY MOUTH TWICE DAILY, Disp: 180 capsule, Rfl: 2 .  glipiZIDE (GLUCOTROL) 5 MG tablet, TAKE 1 TABLET(5 MG) BY MOUTH TWICE  DAILY BEFORE A MEAL, Disp: 60 tablet, Rfl: 5 .  glucose blood (ONETOUCH VERIO) test strip, USE AS DIRECTED TO TEST BLOOD SUGAR ONCE DAILY, Disp: 100 each, Rfl: 3 .  glucose blood test strip, Use as directed to check blood sugar once a day, Disp: 100 each, Rfl: 3 .  lisinopril (PRINIVIL,ZESTRIL) 20 MG tablet, TAKE 1 TABLET BY MOUTH EVERY DAY, Disp: 90 tablet, Rfl: 1 .  metFORMIN (GLUCOPHAGE) 1000 MG tablet, TAKE 1 TABLET BY MOUTH TWICE DAILY WITH A MEAL, Disp: 180 tablet, Rfl: 1 .  omeprazole (PRILOSEC) 10 MG capsule, Take 10 mg by mouth daily as needed., Disp: , Rfl:  .  pravastatin (PRAVACHOL) 40 MG tablet, TAKE 1 AND 1/2 TABLETS(60 MG) BY MOUTH DAILY, Disp: 135 tablet, Rfl: 1  EXAM:  Vitals:   04/14/18 1027  BP: 118/60  Pulse: 68  Temp: 98.9 F (37.2 C)    Body mass index is 28.73 kg/m.  GENERAL: vitals reviewed and listed above, alert, oriented, appears well hydrated and in no acute distress  HEENT: atraumatic, conjunttiva clear, no obvious abnormalities on inspection of external nose and ears  NECK: no obvious masses on inspection  LUNGS: clear to auscultation bilaterally, no wheezes, rales or rhonchi, good air movement  CV: HRRR, no peripheral edema  MS: moves all extremities without noticeable abnormality - using cane  SKIN: several  Oval to circular flat erythematous patches on the R leg, fainter on the left of various sizes approx 2-3 cm indiameter  PSYCH: pleasant and cooperative, no obvious depression or anxiety  ASSESSMENT AND PLAN:  Discussed the following assessment and plan:  Type 2 diabetes mellitus with diabetic neuropathy, without long-term current use of insulin (HCC) - Plan: Hemoglobin A1c  Hypertension associated with diabetes ([place])  Hyperlipidemia associated with type 2 diabetes mellitus ([place]) - Plan: HDL cholesterol, Cholesterol, total  Anemia, unspecified type - Plan: CBC, Vitamin B12, Ferritin  -labs per orders -see dermatology as  planned -lifestyle recs -flu vaccine in oct-nov -Patient advised to return or notify a doctor immediately if symptoms worsen or persist or new concerns arise.  Patient Instructions  BEFORE YOU LEAVE: -labs -follow up: 3-4 months  Complete the stool cards we sent.  See the dermatologist as planned.  We have ordered labs or studies at this visit. It can take up to 1-2 weeks for results and processing. IF results require follow up or explanation, we will call you with instructions. Clinically stable results will be released to your [facility]. If you have not heard from [place] or cannot find your results in [facility] in 2 weeks please contact our office at [phone].  If you are not yet signed up for [facility], please consider signing up.   We recommend the following healthy lifestyle for LIFE: 1) Small portions. But, make sure to get regular (at least 3 per day), healthy meals and small healthy snacks if needed.  2) Eat  a healthy clean diet.   TRY TO EAT: -at least 5-7 servings of low sugar, colorful, and nutrient rich vegetables per day (not corn, potatoes or bananas.) -berries are the best choice if you wish to eat fruit (only eat small amounts if trying to reduce weight)  -lean meets (fish, white meat of chicken or [place]) -vegan proteins for some meals - beans or tofu, whole grains, nuts and seeds -Replace bad fats with good fats - good fats include: fish, nuts and seeds, canola oil, olive oil -small amounts of low fat or non fat dairy -small amounts of100 % whole grains - check the lables -drink plenty of water  AVOID: -SUGAR, sweets, anything with added sugar, corn syrup or sweeteners - must read labels as even foods advertised as "healthy" often are loaded with sugar -if you must have a sweetener, small amounts of stevia may be best -sweetened beverages and artificially sweetened beverages -simple starches (rice, bread, potatoes, pasta, chips, etc - small amounts of 100% whole  grains are ok) -red meat, pork, butter -fried foods, fast food, processed food, excessive dairy, eggs and coconut.  3)Get at least 150 minutes of sweaty aerobic exercise per week.  4)Reduce stress - consider counseling, meditation and relaxation to balance other aspects of your life.          Lucretia Kern, DO

## 2018-04-14 NOTE — Progress Notes (Signed)
Hannah R Kim, DO  

## 2018-04-14 NOTE — Patient Instructions (Addendum)
[name] , Thank you for taking time to come for your Medicare Wellness Visit. I appreciate your ongoing commitment to your health goals. Please review the following plan we discussed and let me know if I can assist you in the future.   Shingrix is a vaccine for the prevention of Shingles in Adults 50 and older.  If you are on Medicare, the shingrix is covered under your Part D plan, so you will take both of the vaccines in the series at your pharmacy. Please check with your benefits regarding applicable copays or out of pocket expenses.  The Shingrix is given in 2 vaccines approx 8 weeks apart. You must receive the 2nd dose prior to 6 months from receipt of the first. Please have the pharmacist print out you Immunization  dates for our office records  Ask the pharmacist about the vaccines will impact your OOP on the drug benefit  These are the goals we discussed: Goals    . Blood Pressure < 140/90    . Exercise 150 min/wk Moderate Activity     Get back to speed walking and balance     . HEMOGLOBIN A1C < 7.0    . LDL CALC < 100       This is a list of the screening recommended for you and due dates:  Health Maintenance  Topic Date Due  . Hemoglobin A1C  03/13/2018  . Lipid (cholesterol) test  04/12/2018  . Flu Shot  03/27/2018  . Complete foot exam   04/12/2018  . Eye exam for diabetics  07/29/2018  . Colon Cancer Screening  11/18/2018  . Mammogram  03/13/2020  . Tetanus Vaccine  07/18/2020  .  Hepatitis C: One time screening is recommended by Center for Disease Control  (CDC) for  adults born from [birth year] through 1966.   Completed  . Pneumonia vaccines  Completed  . DEXA scan (bone density measurement)  Addressed      Fall Prevention in the Home Falls can cause injuries. They can happen to people of all ages. There are many things you can do to make your home safe and to help prevent falls. What can I do on the outside of my home?  Regularly fix the edges of walkways and  driveways and fix any cracks.  Remove anything that might make you trip as you walk through a door, such as a raised step or threshold.  Trim any bushes or trees on the path to your home.  Use bright outdoor lighting.  Clear any walking paths of anything that might make someone trip, such as rocks or tools.  Regularly check to see if handrails are loose or broken. Make sure that both sides of any steps have handrails.  Any raised decks and porches should have guardrails on the edges.  Have any leaves, snow, or ice cleared regularly.  Use sand or salt on walking paths during winter.  Clean up any spills in your garage right away. This includes oil or grease spills. What can I do in the bathroom?  Use night lights.  Install grab bars by the toilet and in the tub and shower. Do not use towel bars as grab bars.  Use non-skid mats or decals in the tub or shower.  If you need to sit down in the shower, use a plastic, non-slip stool.  Keep the floor dry. Clean up any water that spills on the floor as soon as it happens.  Remove soap buildup in  the tub or shower regularly.  Attach bath mats securely with double-sided non-slip rug tape.  Do not have throw rugs and other things on the floor that can make you trip. What can I do in the bedroom?  Use night lights.  Make sure that you have a light by your bed that is easy to reach.  Do not use any sheets or blankets that are too big for your bed. They should not hang down onto the floor.  Have a firm chair that has side arms. You can use this for support while you get dressed.  Do not have throw rugs and other things on the floor that can make you trip. What can I do in the kitchen?  Clean up any spills right away.  Avoid walking on wet floors.  Keep items that you use a lot in easy-to-reach places.  If you need to reach something above you, use a strong step stool that has a grab bar.  Keep electrical cords out of the  way.  Do not use floor polish or wax that makes floors slippery. If you must use wax, use non-skid floor wax.  Do not have throw rugs and other things on the floor that can make you trip. What can I do with my stairs?  Do not leave any items on the stairs.  Make sure that there are handrails on both sides of the stairs and use them. Fix handrails that are broken or loose. Make sure that handrails are as long as the stairways.  Check any carpeting to make sure that it is firmly attached to the stairs. Fix any carpet that is loose or worn.  Avoid having throw rugs at the top or bottom of the stairs. If you do have throw rugs, attach them to the floor with carpet tape.  Make sure that you have a light switch at the top of the stairs and the bottom of the stairs. If you do not have them, ask someone to add them for you. What else can I do to help prevent falls?  Wear shoes that: ? Do not have high heels. ? Have rubber bottoms. ? Are comfortable and fit you well. ? Are closed at the toe. Do not wear sandals.  If you use a stepladder: ? Make sure that it is fully opened. Do not climb a closed stepladder. ? Make sure that both sides of the stepladder are locked into place. ? Ask someone to hold it for you, if possible.  Clearly mark and make sure that you can see: ? Any grab bars or handrails. ? First and last steps. ? Where the edge of each step is.  Use tools that help you move around (mobility aids) if they are needed. These include: ? Canes. ? Walkers. ? Scooters. ? Crutches.  Turn on the lights when you go into a dark area. Replace any light bulbs as soon as they burn out.  Set up your furniture so you have a clear path. Avoid moving your furniture around.  If any of your floors are uneven, fix them.  If there are any pets around you, be aware of where they are.  Review your medicines with your doctor. Some medicines can make you feel dizzy. This can increase your chance  of falling. Ask your doctor what other things that you can do to help prevent falls. This information is not intended to replace advice given to you by your health care provider. Make sure  you discuss any questions you have with your health care provider. Document Released: 06/09/2009 Document Revised: 01/19/2016 Document Reviewed: 09/17/2014 Elsevier Interactive Patient Education  2018 [place] Maintenance, Female Adopting a healthy lifestyle and getting preventive care can go a long way to promote health and wellness. Talk with your health care provider about what schedule of regular examinations is right for you. This is a good chance for you to check in with your provider about disease prevention and staying healthy. In between checkups, there are plenty of things you can do on your own. Experts have done a lot of research about which lifestyle changes and preventive measures are most likely to keep you healthy. Ask your health care provider for more information. Weight and diet Eat a healthy diet  Be sure to include plenty of vegetables, fruits, low-fat dairy products, and lean protein.  Do not eat a lot of foods high in solid fats, added sugars, or salt.  Get regular exercise. This is one of the most important things you can do for your health. ? Most adults should exercise for at least 150 minutes each week. The exercise should increase your heart rate and make you sweat (moderate-intensity exercise). ? Most adults should also do strengthening exercises at least twice a week. This is in addition to the moderate-intensity exercise.  Maintain a healthy weight  Body mass index (BMI) is a measurement that can be used to identify possible weight problems. It estimates body fat based on height and weight. Your health care provider can help determine your BMI and help you achieve or maintain a healthy weight.  For females 68 years of age and older: ? A BMI below 18.5 is  considered underweight. ? A BMI of 18.5 to 24.9 is normal. ? A BMI of 25 to 29.9 is considered overweight. ? A BMI of 30 and above is considered obese.  Watch levels of cholesterol and blood lipids  You should start having your blood tested for lipids and cholesterol at 68 years of age, then have this test every 5 years.  You may need to have your cholesterol levels checked more often if: ? Your lipid or cholesterol levels are high. ? You are older than 68 years of age. ? You are at high risk for heart disease.  Cancer screening Lung Cancer  Lung cancer screening is recommended for adults 82-40 years old who are at high risk for lung cancer because of a history of smoking.  A yearly low-dose CT scan of the lungs is recommended for people who: ? Currently smoke. ? Have quit within the past 15 years. ? Have at least a 30-pack-year history of smoking. A pack year is smoking an average of one pack of cigarettes a day for 1 year.  Yearly screening should continue until it has been 15 years since you quit.  Yearly screening should stop if you develop a health problem that would prevent you from having lung cancer treatment.  Breast Cancer  Practice breast self-awareness. This means understanding how your breasts normally appear and feel.  It also means doing regular breast self-exams. Let your health care provider know about any changes, no matter how small.  If you are in your 20s or 30s, you should have a clinical breast exam (CBE) by a health care provider every 1-3 years as part of a regular health exam.  If you are 16 or older, have a CBE every year. Also consider having a breast X-ray (  mammogram) every year.  If you have a family history of breast cancer, talk to your health care provider about genetic screening.  If you are at high risk for breast cancer, talk to your health care provider about having an MRI and a mammogram every year.  Breast cancer gene (BRCA) assessment  is recommended for women who have family members with BRCA-related cancers. BRCA-related cancers include: ? Breast. ? Ovarian. ? Tubal. ? Peritoneal cancers.  Results of the assessment will determine the need for genetic counseling and BRCA1 and BRCA2 testing.  Cervical Cancer Your health care provider may recommend that you be screened regularly for cancer of the pelvic organs (ovaries, uterus, and vagina). This screening involves a pelvic examination, including checking for microscopic changes to the surface of your cervix (Pap test). You may be encouraged to have this screening done every 3 years, beginning at age 42.  For women ages 84-65, health care providers may recommend pelvic exams and Pap testing every 3 years, or they may recommend the Pap and pelvic exam, combined with testing for human papilloma virus (HPV), every 5 years. Some types of HPV increase your risk of cervical cancer. Testing for HPV may also be done on women of any age with unclear Pap test results.  Other health care providers may not recommend any screening for nonpregnant women who are considered low risk for pelvic cancer and who do not have symptoms. Ask your health care provider if a screening pelvic exam is right for you.  If you have had past treatment for cervical cancer or a condition that could lead to cancer, you need Pap tests and screening for cancer for at least 20 years after your treatment. If Pap tests have been discontinued, your risk factors (such as having a new sexual partner) need to be reassessed to determine if screening should resume. Some women have medical problems that increase the chance of getting cervical cancer. In these cases, your health care provider may recommend more frequent screening and Pap tests.  Colorectal Cancer  This type of cancer can be detected and often prevented.  Routine colorectal cancer screening usually begins at 68 years of age and continues through 68 years of  age.  Your health care provider may recommend screening at an earlier age if you have risk factors for colon cancer.  Your health care provider may also recommend using home test kits to check for hidden blood in the stool.  A small camera at the end of a tube can be used to examine your colon directly (sigmoidoscopy or colonoscopy). This is done to check for the earliest forms of colorectal cancer.  Routine screening usually begins at age 78.  Direct examination of the colon should be repeated every 5-10 years through 68 years of age. However, you may need to be screened more often if early forms of precancerous polyps or small growths are found.  Skin Cancer  Check your skin from head to toe regularly.  Tell your health care provider about any new moles or changes in moles, especially if there is a change in a mole's shape or color.  Also tell your health care provider if you have a mole that is larger than the size of a pencil eraser.  Always use sunscreen. Apply sunscreen liberally and repeatedly throughout the day.  Protect yourself by wearing long sleeves, pants, a wide-brimmed hat, and sunglasses whenever you are outside.  Heart disease, diabetes, and high blood pressure  High blood pressure  causes heart disease and increases the risk of stroke. High blood pressure is more likely to develop in: ? People who have blood pressure in the high end of the normal range (130-139/85-89 mm Hg). ? People who are overweight or obese. ? People who are African American.  If you are 17-41 years of age, have your blood pressure checked every 3-5 years. If you are 65 years of age or older, have your blood pressure checked every year. You should have your blood pressure measured twice-once when you are at a hospital or clinic, and once when you are not at a hospital or clinic. Record the average of the two measurements. To check your blood pressure when you are not at a hospital or clinic, you  can use: ? An automated blood pressure machine at a pharmacy. ? A home blood pressure monitor.  If you are between 54 years and 32 years old, ask your health care provider if you should take aspirin to prevent strokes.  Have regular diabetes screenings. This involves taking a blood sample to check your fasting blood sugar level. ? If you are at a normal weight and have a low risk for diabetes, have this test once every three years after 68 years of age. ? If you are overweight and have a high risk for diabetes, consider being tested at a younger age or more often. Preventing infection Hepatitis B  If you have a higher risk for hepatitis B, you should be screened for this virus. You are considered at high risk for hepatitis B if: ? You were born in a country where hepatitis B is common. Ask your health care provider which countries are considered high risk. ? Your parents were born in a high-risk country, and you have not been immunized against hepatitis B (hepatitis B vaccine). ? You have HIV or AIDS. ? You use needles to inject street drugs. ? You live with someone who has hepatitis B. ? You have had sex with someone who has hepatitis B. ? You get hemodialysis treatment. ? You take certain medicines for conditions, including cancer, organ transplantation, and autoimmune conditions.  Hepatitis C  Blood testing is recommended for: ? Everyone born from [birth year] through 1966. ? Anyone with known risk factors for hepatitis C.  Sexually transmitted infections (STIs)  You should be screened for sexually transmitted infections (STIs) including gonorrhea and chlamydia if: ? You are sexually active and are younger than 68 years of age. ? You are older than 68 years of age and your health care provider tells you that you are at risk for this type of infection. ? Your sexual activity has changed since you were last screened and you are at an increased risk for chlamydia or gonorrhea. Ask your  health care provider if you are at risk.  If you do not have HIV, but are at risk, it may be recommended that you take a prescription medicine daily to prevent HIV infection. This is called pre-exposure prophylaxis (PrEP). You are considered at risk if: ? You are sexually active and do not regularly use condoms or know the HIV status of your partner(s). ? You take drugs by injection. ? You are sexually active with a partner who has HIV.  Talk with your health care provider about whether you are at high risk of being infected with HIV. If you choose to begin PrEP, you should first be tested for HIV. You should then be tested every 3 months for as long  as you are taking PrEP. Pregnancy  If you are premenopausal and you may become pregnant, ask your health care provider about preconception counseling.  If you may become pregnant, take 400 to 800 micrograms (mcg) of folic acid every day.  If you want to prevent pregnancy, talk to your health care provider about birth control (contraception). Osteoporosis and menopause  Osteoporosis is a disease in which the bones lose minerals and strength with aging. This can result in serious bone fractures. Your risk for osteoporosis can be identified using a bone density scan.  If you are 32 years of age or older, or if you are at risk for osteoporosis and fractures, ask your health care provider if you should be screened.  Ask your health care provider whether you should take a calcium or vitamin D supplement to lower your risk for osteoporosis.  Menopause may have certain physical symptoms and risks.  Hormone replacement therapy may reduce some of these symptoms and risks. Talk to your health care provider about whether hormone replacement therapy is right for you. Follow these instructions at home:  Schedule regular health, dental, and eye exams.  Stay current with your immunizations.  Do not use any tobacco products including cigarettes, chewing  tobacco, or electronic cigarettes.  If you are pregnant, do not drink alcohol.  If you are breastfeeding, limit how much and how often you drink alcohol.  Limit alcohol intake to no more than 1 drink per day for nonpregnant women. One drink equals 12 ounces of beer, 5 ounces of wine, or 1 ounces of hard liquor.  Do not use street drugs.  Do not share needles.  Ask your health care provider for help if you need support or information about quitting drugs.  Tell your health care provider if you often feel depressed.  Tell your health care provider if you have ever been abused or do not feel safe at home. This information is not intended to replace advice given to you by your health care provider. Make sure you discuss any questions you have with your health care provider. Document Released: 02/26/2011 Document Revised: 01/19/2016 Document Reviewed: 05/17/2015 Elsevier Interactive Patient Education  [employer].

## 2018-04-14 NOTE — Addendum Note (Signed)
Addended by: Agnes Lawrence on: 04/14/2018 05:39 PM   Modules accepted: Orders

## 2018-04-14 NOTE — Patient Instructions (Signed)
BEFORE YOU LEAVE: -labs -follow up: 3-4 months  Complete the stool cards we sent.  See the dermatologist as planned.  We have ordered labs or studies at this visit. It can take up to 1-2 weeks for results and processing. IF results require follow up or explanation, we will call you with instructions. Clinically stable results will be released to your [facility]. If you have not heard from [place] or cannot find your results in [facility] in 2 weeks please contact our office at [phone].  If you are not yet signed up for [facility], please consider signing up.   We recommend the following healthy lifestyle for LIFE: 1) Small portions. But, make sure to get regular (at least 3 per day), healthy meals and small healthy snacks if needed.  2) Eat a healthy clean diet.   TRY TO EAT: -at least 5-7 servings of low sugar, colorful, and nutrient rich vegetables per day (not corn, potatoes or bananas.) -berries are the best choice if you wish to eat fruit (only eat small amounts if trying to reduce weight)  -lean meets (fish, white meat of chicken or [place]) -vegan proteins for some meals - beans or tofu, whole grains, nuts and seeds -Replace bad fats with good fats - good fats include: fish, nuts and seeds, canola oil, olive oil -small amounts of low fat or non fat dairy -small amounts of100 % whole grains - check the lables -drink plenty of water  AVOID: -SUGAR, sweets, anything with added sugar, corn syrup or sweeteners - must read labels as even foods advertised as "healthy" often are loaded with sugar -if you must have a sweetener, small amounts of stevia may be best -sweetened beverages and artificially sweetened beverages -simple starches (rice, bread, potatoes, pasta, chips, etc - small amounts of 100% whole grains are ok) -red meat, pork, butter -fried foods, fast food, processed food, excessive dairy, eggs and coconut.  3)Get at least 150 minutes of sweaty aerobic exercise per  week.  4)Reduce stress - consider counseling, meditation and relaxation to balance other aspects of your life.

## 2018-04-15 ENCOUNTER — Ambulatory Visit: Payer: PPO | Admitting: Physical Therapy

## 2018-04-15 ENCOUNTER — Encounter: Payer: Self-pay | Admitting: Physical Therapy

## 2018-04-15 DIAGNOSIS — M25662 Stiffness of left knee, not elsewhere classified: Secondary | ICD-10-CM

## 2018-04-15 DIAGNOSIS — M25661 Stiffness of right knee, not elsewhere classified: Secondary | ICD-10-CM

## 2018-04-15 DIAGNOSIS — R209 Unspecified disturbances of skin sensation: Secondary | ICD-10-CM

## 2018-04-15 DIAGNOSIS — M6281 Muscle weakness (generalized): Secondary | ICD-10-CM | POA: Diagnosis not present

## 2018-04-15 DIAGNOSIS — R2689 Other abnormalities of gait and mobility: Secondary | ICD-10-CM

## 2018-04-15 NOTE — Therapy (Signed)
[place], [place], [address] Phone: [phone]   Fax:  [phone]  Physical Therapy Treatment  Patient Details  Name: Renee Watts MRN: [id number] Date of Birth: [birth year] Referring Provider: Dr. Jean Rosenthal   Encounter Date: 04/15/2018  PT End of Session - 04/15/18 1426    Visit Number  2    Number of Visits  16    Date for PT Re-Evaluation  06/03/18    PT Start Time  9458    PT Stop Time  1418    PT Time Calculation (min)  43 min    Activity Tolerance  Patient tolerated treatment well    Behavior During Therapy  [facility] for tasks assessed/performed       Past Medical History:  Diagnosis Date  . Benign paroxysmal positional vertigo 06/25/2013   dx with prior PCP, s/p labs, mri and vestibular rehab; nasal spray for etd seemed to help  . Blood in stool 03/11/2012  . Dependent edema    Bilateral  . Depression   . GERD (gastroesophageal reflux disease)   . Hyperlipidemia   . Low back pain    sciatica; sees orthopedic specialist ([facility])for this and shoulder tendinitis  . Obesity, diabetes, and hypertension syndrome ([place])   . Osteoarthritis, knee    Bilateral, s/p 3 total knee replacement surgerues on right, last 2005. Dr. Tonita Cong  . Pellegrini-Steida syndrome    Myositis ossificans  . Seasonal allergies   . Shingles Jul 10 2012   LESIONS CLEARED BUT NOT TOLERATING ANY THING TIGHT AGAINST BODY--SHINGLES WERE AROSS BACK AND ABDOMEN    Past Surgical History:  Procedure Laterality Date  . ABDOMINAL HYSTERECTOMY  1984  . COLONOSCOPY    . left shoulder steroid injection    . TONSILLECTOMY  1977  . TOTAL HIP ARTHROPLASTY Right 10/10/2012   Procedure: R TOTAL HIP ARTHROPLASTY ANTERIOR APPROACH;  Surgeon: Mcarthur Rossetti, MD;  Location: [facility];  Service: Orthopedics;  Laterality: Right;  Right Total Hip Arthroplasty, Anterior Approach (C-Arm)  . TOTAL KNEE ARTHROPLASTY Right     with multiple revisions, 2003, 2005  . TOTAL KNEE ARTHROPLASTY Left 01/09/2013   Procedure: LEFT TOTAL KNEE ARTHROPLASTY;  Surgeon: Mcarthur Rossetti, MD;  Location: [facility];  Service: Orthopedics;  Laterality: Left;  . TUBAL LIGATION  1978  . WISDOM TOOTH EXTRACTION  2013,2014    There were no vitals filed for this visit.  Subjective Assessment - 04/15/18 1340    Subjective  patient has been doing the HEP it is hard to do on the right.  i left my cane in the truck.    Currently in Pain?  Yes    Pain Score  0-No pain    Pain Location  Leg    Pain Orientation  Right;Left    Pain Descriptors / Indicators  --   prickley feeling from knees down that lasts weeks.   bottim for  feet feel like my feet have been in water too long.   Aggravating Factors   nothing    Pain Relieving Factors  Tylenol    Effect of Pain on Daily Activities  Needs cane         [facility] PT Assessment - 04/15/18 0001      Berg Balance Test   Sit to Stand  Able to stand  independently using hands    Standing Unsupported  Able to stand safely 2 minutes    Sitting with Back Unsupported  but Feet Supported on Floor or Stool  Able to sit safely and securely 2 minutes    Stand to Sit  Controls descent by using hands    Transfers  Able to transfer safely, definite need of hands    Standing Unsupported with Eyes Closed  Able to stand 10 seconds safely    Standing Ubsupported with Feet Together  Needs help to attain position but able to stand for 30 seconds with feet together    From Standing, Reach Forward with Outstretched Arm  Can reach forward >12 cm safely (5")    From Standing Position, Pick up Object from Floor  Able to pick up shoe, needs supervision    From Standing Position, Turn to Look Behind Over each Shoulder  Needs supervision when turning    Turn 360 Degrees  Able to turn 360 degrees safely but slowly   28 seconds for first circle ,  total 49 seconds for both cir   Standing Unsupported, Alternately  Place Feet on Step/Stool  Needs assistance to keep from falling or unable to try    Standing Unsupported, One Foot in [employer] balance while stepping or standing    Standing on One Leg  Unable to try or needs assist to prevent fall    Total Score  31                   [facility] Adult PT Treatment/Exercise - 04/15/18 0001      Knee/Hip Exercises: Aerobic   Nustep  L 4 7 minutes             PT Education - 04/15/18 1425    Education Details  Fall prevention.  Should use a walker full time.      Person(s) Educated  Patient    Methods  Explanation;Handout    Comprehension  Verbalized understanding       PT Short Term Goals - 04/08/18 1030      PT SHORT TERM GOAL #1   Title  Pt will be I with HEP for basic LE strengthening     Time  4    Period  Weeks    Status  New    Target Date  05/06/18      PT SHORT TERM GOAL #2   Title  Pt will increase strength in LEs by 1/2 muscle grade in ankle DF, PF and hips    Time  4    Period  Weeks    Status  New    Target Date  05/06/18      PT SHORT TERM GOAL #3   Title  Pt will be screened for Balance and set goal for mobility.     Time  4    Period  Weeks    Status  New    Target Date  05/06/18      PT SHORT TERM GOAL #4   Title  Pt will tolerate 15 min of bike/NuStep without significant soreness following.     Time  4    Period  Weeks    Status  New    Target Date  05/06/18        PT Long Term Goals - 04/08/18 1123      PT LONG TERM GOAL #1   Title  Pt will be I with final HEP for flexibility and strength     Time  8    Period  Weeks    Status  New  Target Date  06/03/18      PT LONG TERM GOAL #2   Title  Pt will be able to improve ease of transfers (car, chair) by 50% as reported by patient.     Time  8    Period  Weeks    Status  New    Target Date  06/03/18      PT LONG TERM GOAL #3   Title  Pt will improve FOTO to less than 38% to show functional improvement     Time  8    Period  Weeks     Status  New    Target Date  06/03/18      PT LONG TERM GOAL #4   Title  Balance/mobility goal to TBA     Time  8    Period  Weeks    Status  New    Target Date  06/03/18      PT LONG TERM GOAL #5   Title  Pt will be able to stand, squat, stoop and bend for improved childcare in her home with less difficulty, pain.     Time  8    Period  Weeks    Status  New    Target Date  06/03/18            Plan - 04/15/18 1427    Clinical Impression Statement  Most session spent with BERG balance test 31/56 score.  PT Cheryln Manly was able to explain results for a predicted fall.  Patient requires walker full time,  She arrived to PT with no assistive devices. No increased pain reported.     PT Next Visit Plan  balance screen (sit to stand x 5, TUG, )Suggest to do prior to Nu Step ( Fatigue) check HEP , Continue Nustep .  See if patient brought a walker.      PT Home Exercise Plan  bridging, LAQ, seated march, standing heel raises     Consulted and Agree with Plan of Care  Patient       Patient will benefit from skilled therapeutic intervention in order to improve the following deficits and impairments:     Visit Diagnosis: Muscle weakness (generalized)  Stiffness of left knee, not elsewhere classified  Stiffness of right knee, not elsewhere classified  Other abnormalities of gait and mobility  Sensory disturbance     Problem List Patient Active Problem List   Diagnosis Date Noted  . Obesity 01/06/2016  . Low back pain 01/06/2016  . Diabetic peripheral neuropathy ([place]) 09/01/2012  . Hyperlipemia 09/16/2006  . Essential hypertension 09/16/2006  . GERD 09/16/2006  . Type II diabetes mellitus with neurological manifestations ([place]) 06/17/2006  . Osteoarthritis 06/17/2006  . DEPENDENT EDEMA, LEGS, BILATERAL 06/17/2006    Rosina Cressler  PTA 04/15/2018, 2:33 PM  [facility] [address] [place], [place], [address] Phone:  [phone]   Fax:  [phone]  Name: Renee Watts MRN: [id number] Date of Birth: July 21, 1951

## 2018-04-15 NOTE — Patient Instructions (Signed)

## 2018-04-18 ENCOUNTER — Encounter

## 2018-04-21 ENCOUNTER — Ambulatory Visit: Payer: PPO | Admitting: Physical Therapy

## 2018-04-21 ENCOUNTER — Encounter: Payer: Self-pay | Admitting: Physical Therapy

## 2018-04-21 DIAGNOSIS — M25661 Stiffness of right knee, not elsewhere classified: Secondary | ICD-10-CM

## 2018-04-21 DIAGNOSIS — R2689 Other abnormalities of gait and mobility: Secondary | ICD-10-CM

## 2018-04-21 DIAGNOSIS — M6281 Muscle weakness (generalized): Secondary | ICD-10-CM

## 2018-04-21 DIAGNOSIS — M25662 Stiffness of left knee, not elsewhere classified: Secondary | ICD-10-CM

## 2018-04-21 DIAGNOSIS — R209 Unspecified disturbances of skin sensation: Secondary | ICD-10-CM

## 2018-04-21 NOTE — Patient Instructions (Signed)
Access Code: [email or web address]  URL: [email or web address]  Date: 04/21/2018  Prepared by: Raeford Razor   Exercises  Seated Eccentric Ankle Plantar Flexion with Resistance - Straight Leg - 10 reps - 2 sets - 5 hold - 2x daily - 7x weekly  Seated Heel Raise - 10 reps - 2 sets - 5 hold - 2x daily - 7x weekly

## 2018-04-21 NOTE — Therapy (Signed)
[place], [place], [address] Phone: [phone]   Fax:  [phone]  Physical Therapy Treatment  Patient Details  Name: Renee Watts MRN: [id number] Date of Birth: 1950-12-14 Referring Provider: Dr. Jean Rosenthal   Encounter Date: 04/21/2018  PT End of Session - 04/21/18 1058    Visit Number  3    Number of Visits  16    Date for PT Re-Evaluation  06/03/18    PT Start Time  3335    PT Stop Time  1105    PT Time Calculation (min)  50 min    Activity Tolerance  Patient tolerated treatment well    Behavior During Therapy  [facility] for tasks assessed/performed       Past Medical History:  Diagnosis Date  . Benign paroxysmal positional vertigo 06/25/2013   dx with prior PCP, s/p labs, mri and vestibular rehab; nasal spray for etd seemed to help  . Blood in stool 03/11/2012  . Dependent edema    Bilateral  . Depression   . GERD (gastroesophageal reflux disease)   . Hyperlipidemia   . Low back pain    sciatica; sees orthopedic specialist ([facility])for this and shoulder tendinitis  . Obesity, diabetes, and hypertension syndrome ([place])   . Osteoarthritis, knee    Bilateral, s/p 3 total knee replacement surgerues on right, last 2005. Dr. Tonita Cong  . Pellegrini-Steida syndrome    Myositis ossificans  . Seasonal allergies   . Shingles Jul 10 2012   LESIONS CLEARED BUT NOT TOLERATING ANY THING TIGHT AGAINST BODY--SHINGLES WERE AROSS BACK AND ABDOMEN    Past Surgical History:  Procedure Laterality Date  . ABDOMINAL HYSTERECTOMY  1984  . COLONOSCOPY    . left shoulder steroid injection    . TONSILLECTOMY  1977  . TOTAL HIP ARTHROPLASTY Right 10/10/2012   Procedure: R TOTAL HIP ARTHROPLASTY ANTERIOR APPROACH;  Surgeon: Mcarthur Rossetti, MD;  [facility]: [facility];  Service: Orthopedics;  Laterality: Right;  Right Total Hip Arthroplasty, Anterior Approach (C-Arm)  . TOTAL KNEE ARTHROPLASTY Right     with multiple revisions, 2003, 2005  . TOTAL KNEE ARTHROPLASTY Left 01/09/2013   Procedure: LEFT TOTAL KNEE ARTHROPLASTY;  Surgeon: Mcarthur Rossetti, MD;  [facility];  Service: Orthopedics;  Laterality: Left;  . TUBAL LIGATION  1978  . WISDOM TOOTH EXTRACTION  2013,2014    There were no vitals filed for this visit.  Subjective Assessment - 04/21/18 1019    Subjective  No new complaints.  Has been doing HEP.  Soreness in Rt leg.  no pain.     Currently in Pain?  No/denies   soreness            [facility] Adult PT Treatment/Exercise - 04/21/18 0001      Lumbar Exercises: Stretches   Single Knee to Chest Stretch  2 reps;30 seconds      Lumbar Exercises: Aerobic   Nustep  L5 UE and LE for 5 min strengthening end of session.      Knee/Hip Exercises: Stretches   Press photographer  Both;2 reps;30 seconds      Knee/Hip Exercises: Standing   Heel Raises  Both;1 set    Heel Raises Limitations  unable without compensations     Hip Abduction  Stengthening;Both;1 set;10 reps      Knee/Hip Exercises: Seated   Long Arc Quad  Strengthening;Both;1 set;20 reps;Weights    Long Arc Quad Weight  3 lbs.  Long [employer] Limitations  used ball for LE alignment     Hamstring Curl  Strengthening;Both;1 set;10 reps    Hamstring Limitations  blue band     Sit to Sand  1 set;10 reps;without UE support;Other (comment)   x 10 with min A initially, needs higher seat (22.5 inch)      Knee/Hip Exercises: Supine   Bridges with Ball Squeeze  Strengthening;Both;1 set;10 reps    Bridges with Clamshell  Strengthening;Both;1 set;10 reps    Other Supine Knee/Hip Exercises  clam blue band small ROM x 10       Ankle Exercises: Seated   Heel Raises  Both;15 reps    Other Seated Ankle Exercises  ankle PF with blue band              PT Education - 04/21/18 1058    Education Details  HEP, strengthening, alternative to heel raise     Person(s) Educated  Patient    Methods   Explanation;Demonstration;Handout    Comprehension  Verbalized understanding;Returned demonstration       PT Short Term Goals - 04/21/18 1058      PT SHORT TERM GOAL #1   Title  Pt will be I with HEP for basic LE strengthening     Status  Achieved      PT SHORT TERM GOAL #2   Title  Pt will increase strength in LEs by 1/2 muscle grade in ankle DF, PF and hips    Status  On-going      PT SHORT TERM GOAL #3   Title  Pt will be screened for Balance and set goal for mobility.     Baseline  37/56    Status  On-going      PT SHORT TERM GOAL #4   Title  Pt will tolerate 15 min of bike/NuStep without significant soreness following.     Status  On-going        PT Long Term Goals - 04/08/18 1123      PT LONG TERM GOAL #1   Title  Pt will be I with final HEP for flexibility and strength     Time  8    Period  Weeks    Status  New    Target Date  06/03/18      PT LONG TERM GOAL #2   Title  Pt will be able to improve ease of transfers (car, chair) by 50% as reported by patient.     Time  8    Period  Weeks    Status  New    Target Date  06/03/18      PT LONG TERM GOAL #3   Title  Pt will improve FOTO to less than 38% to show functional improvement     Time  8    Period  Weeks    Status  New    Target Date  06/03/18      PT LONG TERM GOAL #4   Title  Balance/mobility goal to TBA     Time  8    Period  Weeks    Status  New    Target Date  06/03/18      PT LONG TERM GOAL #5   Title  Pt will be able to stand, squat, stoop and bend for improved childcare in her home with less difficulty, pain.     Time  8    Period  Weeks    Status  New    Target Date  06/03/18            Plan - 04/21/18 1059    Clinical Impression Statement  Focuse on strengthening.  KNees appear to be bending more easily on sitting. She feels she can lift her Rt leg more than before.  Used cane today and was mod I .     PT Treatment/Interventions  ADLs/Self Care Home Management;Electrical  Stimulation;Functional mobility training;Neuromuscular re-education;Cryotherapy;Ultrasound;Moist Heat;Patient/family education;Therapeutic exercise;Manual techniques;Passive range of motion;Balance training;Gait training;DME Instruction;Therapeutic activities    PT Next Visit Plan  balance screen (sit to stand x 5, TUG, )Suggest to do prior to Nu Step ( Fatigue) check HEP , Continue Nustep . standing ankle, hip     PT Home Exercise Plan  bridging, LAQ, seated march, standing/seated heel raises and blue band plantaflexion    Consulted and Agree with Plan of Care  Patient       Patient will benefit from skilled therapeutic intervention in order to improve the following deficits and impairments:  Abnormal gait, Decreased balance, Decreased endurance, Decreased mobility, Difficulty walking, Impaired sensation, Pain, Postural dysfunction, Impaired UE functional use, Impaired flexibility, Increased fascial restricitons, Decreased strength, Decreased activity tolerance, Decreased range of motion, Increased edema  Visit Diagnosis: Muscle weakness (generalized)  Stiffness of left knee, not elsewhere classified  Stiffness of right knee, not elsewhere classified  Other abnormalities of gait and mobility  Sensory disturbance     Problem List Patient Active Problem List   Diagnosis Date Noted  . Obesity 01/06/2016  . Low back pain 01/06/2016  . Diabetic peripheral neuropathy ([place]) 09/01/2012  . Hyperlipemia 09/16/2006  . Essential hypertension 09/16/2006  . GERD 09/16/2006  . Type II diabetes mellitus with neurological manifestations ([place]) 06/17/2006  . Osteoarthritis 06/17/2006  . DEPENDENT EDEMA, LEGS, BILATERAL 06/17/2006    Kaleya Douse 04/21/2018, 1:03 PM  [facility] [address] [place], [place], [address] Phone: [phone]   Fax:  [phone]  Name: KIERSTYN BARANOWSKI MRN: [id number] Date of Birth: 09-13-1950  Raeford Razor,  PT 04/21/18 1:03 PM Phone: [phone] Fax: [phone]

## 2018-04-23 ENCOUNTER — Ambulatory Visit: Payer: PPO | Admitting: Physical Therapy

## 2018-04-23 ENCOUNTER — Encounter: Payer: Self-pay | Admitting: Physical Therapy

## 2018-04-23 DIAGNOSIS — M25662 Stiffness of left knee, not elsewhere classified: Secondary | ICD-10-CM

## 2018-04-23 DIAGNOSIS — R209 Unspecified disturbances of skin sensation: Secondary | ICD-10-CM

## 2018-04-23 DIAGNOSIS — M25661 Stiffness of right knee, not elsewhere classified: Secondary | ICD-10-CM

## 2018-04-23 DIAGNOSIS — R2689 Other abnormalities of gait and mobility: Secondary | ICD-10-CM

## 2018-04-23 DIAGNOSIS — M6281 Muscle weakness (generalized): Secondary | ICD-10-CM

## 2018-04-23 NOTE — Therapy (Signed)
[place], [place], [address] Phone: [phone]   Fax:  [phone]  Physical Therapy Treatment  Patient Details  Name: Renee Watts MRN: [id number] Date of Birth: March 09, 1951 Referring Provider: Dr. Jean Rosenthal   Encounter Date: 04/23/2018  PT End of Session - 04/23/18 1105    Visit Number  4    Number of Visits  16    Date for PT Re-Evaluation  06/03/18    PT Start Time  1100    PT Stop Time  1145    PT Time Calculation (min)  45 min    Activity Tolerance  Patient tolerated treatment well    Behavior During Therapy  [facility] for tasks assessed/performed       Past Medical History:  Diagnosis Date  . Benign paroxysmal positional vertigo 06/25/2013   dx with prior PCP, s/p labs, mri and vestibular rehab; nasal spray for etd seemed to help  . Blood in stool 03/11/2012  . Dependent edema    Bilateral  . Depression   . GERD (gastroesophageal reflux disease)   . Hyperlipidemia   . Low back pain    sciatica; sees orthopedic specialist ([facility])for this and shoulder tendinitis  . Obesity, diabetes, and hypertension syndrome ([place])   . Osteoarthritis, knee    Bilateral, s/p 3 total knee replacement surgerues on right, last 2005. Dr. Tonita Cong  . Pellegrini-Steida syndrome    Myositis ossificans  . Seasonal allergies   . Shingles Jul 10 2012   LESIONS CLEARED BUT NOT TOLERATING ANY THING TIGHT AGAINST BODY--SHINGLES WERE AROSS BACK AND ABDOMEN    Past Surgical History:  Procedure Laterality Date  . ABDOMINAL HYSTERECTOMY  1984  . COLONOSCOPY    . left shoulder steroid injection    . TONSILLECTOMY  1977  . TOTAL HIP ARTHROPLASTY Right 10/10/2012   Procedure: R TOTAL HIP ARTHROPLASTY ANTERIOR APPROACH;  Surgeon: Mcarthur Rossetti, MD;  [facility];  Service: Orthopedics;  Laterality: Right;  Right Total Hip Arthroplasty, Anterior Approach (C-Arm)  . TOTAL KNEE ARTHROPLASTY Right     with multiple revisions, 2003, 2005  . TOTAL KNEE ARTHROPLASTY Left 01/09/2013   Procedure: LEFT TOTAL KNEE ARTHROPLASTY;  Surgeon: Mcarthur Rossetti, MD;  [facility];  Service: Orthopedics;  Laterality: Left;  . TUBAL LIGATION  1978  . WISDOM TOOTH EXTRACTION  2013,2014    There were no vitals filed for this visit.  Subjective Assessment - 04/23/18 1105    Subjective  No pain.      Currently in Pain?  No/denies         [facility] Adult PT Treatment/Exercise - 04/23/18 0001      High Level Balance   High Level Balance Activities  Side stepping;Backward walking;Tandem walking;Marching forwards;Marching backwards    High Level Balance Comments  used very light finger tip A and mirri      Lumbar Exercises: Aerobic   Nustep  L5 UE and LE for 5 min strengthening end of session.      Knee/Hip Exercises: Stretches   Other Knee/Hip Stretches  knee to chest for hip flexion x 2 each       Knee/Hip Exercises: Standing   Hip Flexion  Stengthening;Both;2 sets;10 reps    Hip Abduction  Stengthening;Both;1 set;10 reps    Forward Step Up  2 sets;10 reps    Forward Step Up Limitations  4 inch     Step Down  Both;1 set;10 reps;Hand Hold: 2;Step  Height: 4"    Functional Squat  1 set;10 reps    Functional Squat Limitations  relies on UE       Ankle Exercises: Seated   Other Seated Ankle Exercises  ankle PF and DF with green band x 10 wth PT assist                PT Short Term Goals - 04/23/18 1243      PT SHORT TERM GOAL #1   Title  Pt will be I with HEP for basic LE strengthening     Status  Achieved      PT SHORT TERM GOAL #2   Title  Pt will increase strength in LEs by 1/2 muscle grade in ankle DF, PF and hips    Status  Unable to assess      PT SHORT TERM GOAL #3   Title  Pt will be screened for Balance and set goal for mobility.     Status  Achieved      PT SHORT TERM GOAL #4   Title  Pt will tolerate 15 min of bike/NuStep without significant soreness  following.     Status  On-going        PT Long Term Goals - 04/08/18 1123      PT LONG TERM GOAL #1   Title  Pt will be I with final HEP for flexibility and strength     Time  8    Period  Weeks    Status  New    Target Date  06/03/18      PT LONG TERM GOAL #2   Title  Pt will be able to improve ease of transfers (car, chair) by 50% as reported by patient.     Time  8    Period  Weeks    Status  New    Target Date  06/03/18      PT LONG TERM GOAL #3   Title  Pt will improve FOTO to less than 38% to show functional improvement     Time  8    Period  Weeks    Status  New    Target Date  06/03/18      PT LONG TERM GOAL #4   Title  Balance/mobility goal to TBA     Time  8    Period  Weeks    Status  New    Target Date  06/03/18      PT LONG TERM GOAL #5   Title  Pt will be able to stand, squat, stoop and bend for improved childcare in her home with less difficulty, pain.     Time  8    Period  Weeks    Status  New    Target Date  06/03/18            Plan - 04/23/18 1146    Clinical Impression Statement  Much of session spent on balance using visual cues for proprioception and coordination.  Highly reliant on vision and UE for support.  No LOB but unsteady even with cane. Ankle PF and DF more effective in supine.     PT Treatment/Interventions  ADLs/Self Care Home Management;Electrical Stimulation;Functional mobility training;Neuromuscular re-education;Cryotherapy;Ultrasound;Moist Heat;Patient/family education;Therapeutic exercise;Manual techniques;Passive range of motion;Balance training;Gait training;DME Instruction;Therapeutic activities    PT Next Visit Plan  balance screen (sit to stand x 5, TUG, )Suggest to do prior to Nu Step ( Fatigue) check HEP , Continue Nustep .  standing ankle, hip     PT Home Exercise Plan  bridging, LAQ, seated march, standing/seated heel raises and blue band plantaflexion    Consulted and Agree with Plan of Care  Patient        Patient will benefit from skilled therapeutic intervention in order to improve the following deficits and impairments:  Abnormal gait, Decreased balance, Decreased endurance, Decreased mobility, Difficulty walking, Impaired sensation, Pain, Postural dysfunction, Impaired UE functional use, Impaired flexibility, Increased fascial restricitons, Decreased strength, Decreased activity tolerance, Decreased range of motion, Increased edema  Visit Diagnosis: Muscle weakness (generalized)  Stiffness of left knee, not elsewhere classified  Stiffness of right knee, not elsewhere classified  Other abnormalities of gait and mobility  Sensory disturbance     Problem List Patient Active Problem List   Diagnosis Date Noted  . Obesity 01/06/2016  . Low back pain 01/06/2016  . Diabetic peripheral neuropathy ([place]) 09/01/2012  . Hyperlipemia 09/16/2006  . Essential hypertension 09/16/2006  . GERD 09/16/2006  . Type II diabetes mellitus with neurological manifestations ([place]) 06/17/2006  . Osteoarthritis 06/17/2006  . DEPENDENT EDEMA, LEGS, BILATERAL 06/17/2006    Mahina Salatino 04/23/2018, 12:43 PM  [facility] [address] [place], [place], [address] Phone: [phone]   Fax:  [phone]  Name: MURLINE WEIGEL MRN: [id number] Date of Birth: 10-14-1950  Raeford Razor, PT 04/23/18 12:43 PM Phone: [phone] Fax: [phone]

## 2018-04-29 ENCOUNTER — Encounter: Payer: Self-pay | Admitting: Physical Therapy

## 2018-04-29 ENCOUNTER — Ambulatory Visit: Payer: PPO | Attending: Orthopaedic Surgery | Admitting: Physical Therapy

## 2018-04-29 DIAGNOSIS — R2689 Other abnormalities of gait and mobility: Secondary | ICD-10-CM | POA: Diagnosis not present

## 2018-04-29 DIAGNOSIS — M25662 Stiffness of left knee, not elsewhere classified: Secondary | ICD-10-CM | POA: Diagnosis not present

## 2018-04-29 DIAGNOSIS — M25661 Stiffness of right knee, not elsewhere classified: Secondary | ICD-10-CM

## 2018-04-29 DIAGNOSIS — R209 Unspecified disturbances of skin sensation: Secondary | ICD-10-CM | POA: Insufficient documentation

## 2018-04-29 DIAGNOSIS — M6281 Muscle weakness (generalized): Secondary | ICD-10-CM

## 2018-04-29 NOTE — Therapy (Signed)
[place], [place], [address] Phone: [phone]   Fax:  [phone]  Physical Therapy Treatment  Patient Details  Name: Renee Watts MRN: [id number] Date of Birth: 06/23/1951 Referring Provider: Dr. Jean Rosenthal   Encounter Date: 04/29/2018  PT End of Session - 04/29/18 1110    Visit Number  5    Number of Visits  16    Date for PT Re-Evaluation  06/03/18    PT Start Time  1103    PT Stop Time  1148    PT Time Calculation (min)  45 min    Activity Tolerance  Patient tolerated treatment well    Behavior During Therapy  [facility] for tasks assessed/performed       Past Medical History:  Diagnosis Date  . Benign paroxysmal positional vertigo 06/25/2013   dx with prior PCP, s/p labs, mri and vestibular rehab; nasal spray for etd seemed to help  . Blood in stool 03/11/2012  . Dependent edema    Bilateral  . Depression   . GERD (gastroesophageal reflux disease)   . Hyperlipidemia   . Low back pain    sciatica; sees orthopedic specialist ([facility])for this and shoulder tendinitis  . Obesity, diabetes, and hypertension syndrome ([place])   . Osteoarthritis, knee    Bilateral, s/p 3 total knee replacement surgerues on right, last 2005. Dr. Tonita Cong  . Pellegrini-Steida syndrome    Myositis ossificans  . Seasonal allergies   . Shingles Jul 10 2012   LESIONS CLEARED BUT NOT TOLERATING ANY THING TIGHT AGAINST BODY--SHINGLES WERE AROSS BACK AND ABDOMEN    Past Surgical History:  Procedure Laterality Date  . ABDOMINAL HYSTERECTOMY  1984  . COLONOSCOPY    . left shoulder steroid injection    . TONSILLECTOMY  1977  . TOTAL HIP ARTHROPLASTY Right 10/10/2012   Procedure: R TOTAL HIP ARTHROPLASTY ANTERIOR APPROACH;  Surgeon: Mcarthur Rossetti, MD;  Location: [facility];  Service: Orthopedics;  Laterality: Right;  Right Total Hip Arthroplasty, Anterior Approach (C-Arm)  . TOTAL KNEE ARTHROPLASTY Right     with multiple revisions, 2003, 2005  . TOTAL KNEE ARTHROPLASTY Left 01/09/2013   Procedure: LEFT TOTAL KNEE ARTHROPLASTY;  Surgeon: Mcarthur Rossetti, MD;  Location: [facility];  Service: Orthopedics;  Laterality: Left;  . TUBAL LIGATION  1978  . WISDOM TOOTH EXTRACTION  2013,2014    There were no vitals filed for this visit.  Subjective Assessment - 04/29/18 1105    Subjective  L knee pain today, 3/10.  I'm working my legs.   Sees Dr. Ninfa Linden today.    Currently in Pain?  Yes    Pain Score  3     Pain Location  Knee    Pain Orientation  Left         [facility] PT Assessment - 04/29/18 0001      AROM   Right Knee Flexion  93    Left Knee Flexion  95      Strength   Right Hip ABduction  4-/5    Left Hip Flexion  3+/5    Left Hip ABduction  4-/5    Right Knee Flexion  4+/5    Right Knee Extension  4-/5    Left Knee Flexion  4+/5    Left Knee Extension  4+/5    Right Ankle Dorsiflexion  3/5    Left Ankle Dorsiflexion  3+/5  [facility] Adult PT Treatment/Exercise - 04/29/18 0001      Lumbar Exercises: Stretches   Single Knee to Chest Stretch  2 reps;30 seconds      Knee/Hip Exercises: Stretches   Knee: Self-Stretch to increase Flexion  Both;5 reps    Gastroc Stretch  Both;2 reps;30 seconds    Gastroc Stretch Limitations  slantboard       Knee/Hip Exercises: Aerobic   Nustep  L5 LE only 7 min       Knee/Hip Exercises: Machines for Strengthening   Cybex Leg Press  1 plate parallel x 15, turnout x 15 focus on control       Knee/Hip Exercises: Supine   Bridges  Strengthening;Both;2 sets;10 reps    Straight Leg Raises  Strengthening;Both;1 set;10 reps    Straight Leg Raises Limitations  need A on Rt LE                PT Short Term Goals - 04/29/18 1132      PT SHORT TERM GOAL #1   Title  Pt will be I with HEP for basic LE strengthening     Status  Achieved      PT SHORT TERM GOAL #2   Title  Pt will increase strength in LEs by 1/2 muscle grade  in ankle DF, PF and hips    Baseline  hips     Status  Partially Met      PT SHORT TERM GOAL #3   Title  Pt will be screened for Balance and set goal for mobility.     Status  Achieved      PT SHORT TERM GOAL #4   Title  Pt will tolerate 15 min of bike/NuStep without significant soreness following.     Baseline  can do 7-8 min     Status  Unable to assess        PT Long Term Goals - 04/29/18 1133      PT LONG TERM GOAL #1   Title  Pt will be I with final HEP for flexibility and strength     Status  On-going      PT LONG TERM GOAL #2   Title  Pt will be able to improve ease of transfers (car, chair) by 50% as reported by patient.     Status  On-going      PT LONG TERM GOAL #3   Title  Pt will improve FOTO to less than 38% to show functional improvement     Status  On-going      PT LONG TERM GOAL #4   Title  Pt will score 42/56 on Berg Balance Test to reduce fall risk.     Status  New            Plan - 04/29/18 1150    Clinical Impression Statement  Pt sees MD tomorrow.  She has increased her hip strength a bit and she is doing better with transfers at home.  She continues to have significant ankle weakness (3/5) and needs A to lift Rt LE up when changing positions.  Will continue to benefit from PT for LE strengthening and balance.     PT Treatment/Interventions  ADLs/Self Care Home Management;Electrical Stimulation;Functional mobility training;Neuromuscular re-education;Cryotherapy;Ultrasound;Moist Heat;Patient/family education;Therapeutic exercise;Manual techniques;Passive range of motion;Balance training;Gait training;DME Instruction;Therapeutic activities    PT Next Visit Plan  balance screen (sit to stand x 5, TUG, ) check HEP , Continue Nustep . standing ankle, hip .  What did MD say     PT Home Exercise Plan  bridging, LAQ, seated march, standing/seated heel raises and blue band plantaflexion    Consulted and Agree with Plan of Care  Patient       Patient will  benefit from skilled therapeutic intervention in order to improve the following deficits and impairments:  Abnormal gait, Decreased balance, Decreased endurance, Decreased mobility, Difficulty walking, Impaired sensation, Pain, Postural dysfunction, Impaired UE functional use, Impaired flexibility, Increased fascial restricitons, Decreased strength, Decreased activity tolerance, Decreased range of motion, Increased edema  Visit Diagnosis: Muscle weakness (generalized)  Stiffness of left knee, not elsewhere classified  Stiffness of right knee, not elsewhere classified  Other abnormalities of gait and mobility  Sensory disturbance     Problem List Patient Active Problem List   Diagnosis Date Noted  . Obesity 01/06/2016  . Low back pain 01/06/2016  . Diabetic peripheral neuropathy ([place]) 09/01/2012  . Hyperlipemia 09/16/2006  . Essential hypertension 09/16/2006  . GERD 09/16/2006  . Type II diabetes mellitus with neurological manifestations ([place]) 06/17/2006  . Osteoarthritis 06/17/2006  . DEPENDENT EDEMA, LEGS, BILATERAL 06/17/2006    Roanna Reaves 04/29/2018, 11:53 AM  [facility] [address] [place], [place], [address] Phone: [phone]   Fax:  [phone]  Name: SAYWARD HORVATH MRN: [id number] Date of Birth: June 15, 1951  Raeford Razor, PT 04/29/18 11:54 AM Phone: [phone] Fax: [phone]

## 2018-04-30 ENCOUNTER — Encounter (INDEPENDENT_AMBULATORY_CARE_PROVIDER_SITE_OTHER): Payer: Self-pay | Admitting: Orthopaedic Surgery

## 2018-04-30 ENCOUNTER — Ambulatory Visit (INDEPENDENT_AMBULATORY_CARE_PROVIDER_SITE_OTHER): Payer: PPO | Admitting: Orthopaedic Surgery

## 2018-04-30 DIAGNOSIS — M79604 Pain in right leg: Secondary | ICD-10-CM | POA: Diagnosis not present

## 2018-04-30 DIAGNOSIS — M79605 Pain in left leg: Secondary | ICD-10-CM | POA: Diagnosis not present

## 2018-04-30 NOTE — Progress Notes (Signed)
The patient is returning today after having 6 weeks of physical therapy due to generalized leg weakness with issues with balance and coordination and leg pain.  She says with therapy she is doing much better overall.  She is using a cane to ambulate and I think this is reasonable as well.  She is getting out of chair easier in her activities daily living and mobility are improving with therapy.  On exam she is got improved strength throughout her bilateral lower extremities and improve balance and coordination.  Seeing her ambulate I do agree that she still needs a cane.  At this point I would ever continue therapy for at least the next 2 to 4 weeks to work on her balance coordination and strengthening and to transition to home exercise program as well.  All questions concerns were answered and addressed.  Follow-up will be as needed otherwise.

## 2018-05-01 ENCOUNTER — Ambulatory Visit: Payer: PPO | Admitting: Physical Therapy

## 2018-05-01 ENCOUNTER — Encounter: Payer: Self-pay | Admitting: Physical Therapy

## 2018-05-01 DIAGNOSIS — M25661 Stiffness of right knee, not elsewhere classified: Secondary | ICD-10-CM

## 2018-05-01 DIAGNOSIS — R209 Unspecified disturbances of skin sensation: Secondary | ICD-10-CM

## 2018-05-01 DIAGNOSIS — R2689 Other abnormalities of gait and mobility: Secondary | ICD-10-CM

## 2018-05-01 DIAGNOSIS — M25662 Stiffness of left knee, not elsewhere classified: Secondary | ICD-10-CM

## 2018-05-01 DIAGNOSIS — M6281 Muscle weakness (generalized): Secondary | ICD-10-CM | POA: Diagnosis not present

## 2018-05-01 NOTE — Therapy (Signed)
[place], [place], [address] Phone: [phone]   Fax:  [phone]  Physical Therapy Treatment  Patient Details  Name: Renee Watts MRN: [id number] Date of Birth: December 08, 1950 Referring Provider: Dr. Jean Rosenthal   Encounter Date: 05/01/2018  PT End of Session - 05/01/18 1153    Visit Number  6    Number of Visits  16    Date for PT Re-Evaluation  06/03/18    PT Start Time  7793    PT Stop Time  1230    PT Time Calculation (min)  43 min       Past Medical History:  Diagnosis Date  . Benign paroxysmal positional vertigo 06/25/2013   dx with prior PCP, s/p labs, mri and vestibular rehab; nasal spray for etd seemed to help  . Blood in stool 03/11/2012  . Dependent edema    Bilateral  . Depression   . GERD (gastroesophageal reflux disease)   . Hyperlipidemia   . Low back pain    sciatica; sees orthopedic specialist ([facility])for this and shoulder tendinitis  . Obesity, diabetes, and hypertension syndrome ([place])   . Osteoarthritis, knee    Bilateral, s/p 3 total knee replacement surgerues on right, last 2005. Dr. Tonita Cong  . Pellegrini-Steida syndrome    Myositis ossificans  . Seasonal allergies   . Shingles Jul 10 2012   LESIONS CLEARED BUT NOT TOLERATING ANY THING TIGHT AGAINST BODY--SHINGLES WERE AROSS BACK AND ABDOMEN    Past Surgical History:  Procedure Laterality Date  . ABDOMINAL HYSTERECTOMY  1984  . COLONOSCOPY    . left shoulder steroid injection    . TONSILLECTOMY  1977  . TOTAL HIP ARTHROPLASTY Right 10/10/2012   Procedure: R TOTAL HIP ARTHROPLASTY ANTERIOR APPROACH;  Surgeon: Mcarthur Rossetti, MD;  [facility];  Service: Orthopedics;  Laterality: Right;  Right Total Hip Arthroplasty, Anterior Approach (C-Arm)  . TOTAL KNEE ARTHROPLASTY Right    with multiple revisions, 2003, 2005  . TOTAL KNEE ARTHROPLASTY Left 01/09/2013   Procedure: LEFT TOTAL KNEE  ARTHROPLASTY;  Surgeon: Mcarthur Rossetti, MD;  [facility];  Service: Orthopedics;  Laterality: Left;  . TUBAL LIGATION  1978  . WISDOM TOOTH EXTRACTION  2013,2014    There were no vitals filed for this visit.  Subjective Assessment - 05/01/18 1151    Subjective  MD said to continue for the month. He is pleased with progress. He released me unless I need something.     Currently in Pain?  No/denies                       [facility] Adult PT Treatment/Exercise - 05/01/18 0001      Knee/Hip Exercises: Aerobic   Nustep  L5 LE only 10 minutes       Knee/Hip Exercises: Machines for Strengthening   Cybex Leg Press  1 plate parallel x 15, turnout x 15 focus on control       Knee/Hip Exercises: Standing   Heel Raises  Both;1 set    Heel Raises Limitations  unable without compensations     Hip Flexion  Stengthening;Both;2 sets;10 reps    Hip Abduction  Stengthening;Both;10 reps;2 sets    Functional Squat  1 set;10 reps      Knee/Hip Exercises: Seated   Hamstring Curl  Strengthening;Both;1 set;15 reps    Hamstring Limitations  blue band      Knee/Hip Exercises: Supine  Bridges  Strengthening;Both;2 sets;10 reps               PT Short Term Goals - 04/29/18 1132      PT SHORT TERM GOAL #1   Title  Pt will be I with HEP for basic LE strengthening     Status  Achieved      PT SHORT TERM GOAL #2   Title  Pt will increase strength in LEs by 1/2 muscle grade in ankle DF, PF and hips    Baseline  hips     Status  Partially Met      PT SHORT TERM GOAL #3   Title  Pt will be screened for Balance and set goal for mobility.     Status  Achieved      PT SHORT TERM GOAL #4   Title  Pt will tolerate 15 min of bike/NuStep without significant soreness following.     Baseline  can do 7-8 min     Status  Unable to assess        PT Long Term Goals - 04/29/18 1133      PT LONG TERM GOAL #1   Title  Pt will be I with final HEP for flexibility and strength      Status  On-going      PT LONG TERM GOAL #2   Title  Pt will be able to improve ease of transfers (car, chair) by 50% as reported by patient.     Status  On-going      PT LONG TERM GOAL #3   Title  Pt will improve FOTO to less than 38% to show functional improvement     Status  On-going      PT LONG TERM GOAL #4   Title  Pt will score 42/56 on Berg Balance Test to reduce fall risk.     Status  New            Plan - 05/01/18 1158    Clinical Impression Statement  MD pleased with progress, wants her to continue.  Completed Nustep for 10 minutes. PRogressing toward strength goals,     PT Next Visit Plan  balance screen (sit to stand x 5, TUG, ) check HEP , Continue Nustep . standing ankle, hip .      PT Home Exercise Plan  bridging, LAQ, seated march, standing/seated heel raises and blue band plantaflexion    Consulted and Agree with Plan of Care  Patient       Patient will benefit from skilled therapeutic intervention in order to improve the following deficits and impairments:  Abnormal gait, Decreased balance, Decreased endurance, Decreased mobility, Difficulty walking, Impaired sensation, Pain, Postural dysfunction, Impaired UE functional use, Impaired flexibility, Increased fascial restricitons, Decreased strength, Decreased activity tolerance, Decreased range of motion, Increased edema  Visit Diagnosis: Muscle weakness (generalized)  Stiffness of left knee, not elsewhere classified  Stiffness of right knee, not elsewhere classified  Other abnormalities of gait and mobility  Sensory disturbance     Problem List Patient Active Problem List   Diagnosis Date Noted  . Obesity 01/06/2016  . Low back pain 01/06/2016  . Diabetic peripheral neuropathy ([place]) 09/01/2012  . Hyperlipemia 09/16/2006  . Essential hypertension 09/16/2006  . GERD 09/16/2006  . Type II diabetes mellitus with neurological manifestations ([place]) 06/17/2006  . Osteoarthritis 06/17/2006  .  DEPENDENT EDEMA, LEGS, BILATERAL 06/17/2006    Dorene Ar, PTA 05/01/2018, 12:34 PM  [place]  [facility] [address] [place], [place], [address] Phone: [phone]   Fax:  [phone]  Name: Renee Watts MRN: [id number] Date of Birth: April 14, 1951

## 2018-05-05 ENCOUNTER — Ambulatory Visit: Payer: PPO | Admitting: Physical Therapy

## 2018-05-05 ENCOUNTER — Encounter: Payer: Self-pay | Admitting: Physical Therapy

## 2018-05-05 DIAGNOSIS — M25661 Stiffness of right knee, not elsewhere classified: Secondary | ICD-10-CM

## 2018-05-05 DIAGNOSIS — M6281 Muscle weakness (generalized): Secondary | ICD-10-CM

## 2018-05-05 DIAGNOSIS — R209 Unspecified disturbances of skin sensation: Secondary | ICD-10-CM

## 2018-05-05 DIAGNOSIS — R2689 Other abnormalities of gait and mobility: Secondary | ICD-10-CM

## 2018-05-05 DIAGNOSIS — M25662 Stiffness of left knee, not elsewhere classified: Secondary | ICD-10-CM

## 2018-05-05 NOTE — Therapy (Signed)
[place], [place], [address] Phone: [phone]   Fax:  [phone]  Physical Therapy Treatment  Patient Details  Name: Renee Watts MRN: [id number] Date of Birth: 10-Oct-1950 Referring Provider: Dr. Jean Rosenthal   Encounter Date: 05/05/2018  PT End of Session - 05/05/18 1212    Visit Number  7    Number of Visits  16    Date for PT Re-Evaluation  06/03/18    PT Start Time  1100    PT Stop Time  1145    PT Time Calculation (min)  45 min    Activity Tolerance  Patient tolerated treatment well    Behavior During Therapy  [facility] for tasks assessed/performed       Past Medical History:  Diagnosis Date  . Benign paroxysmal positional vertigo 06/25/2013   dx with prior PCP, s/p labs, mri and vestibular rehab; nasal spray for etd seemed to help  . Blood in stool 03/11/2012  . Dependent edema    Bilateral  . Depression   . GERD (gastroesophageal reflux disease)   . Hyperlipidemia   . Low back pain    sciatica; sees orthopedic specialist ([facility])for this and shoulder tendinitis  . Obesity, diabetes, and hypertension syndrome ([place])   . Osteoarthritis, knee    Bilateral, s/p 3 total knee replacement surgerues on right, last 2005. Dr. Tonita Cong  . Pellegrini-Steida syndrome    Myositis ossificans  . Seasonal allergies   . Shingles Jul 10 2012   LESIONS CLEARED BUT NOT TOLERATING ANY THING TIGHT AGAINST BODY--SHINGLES WERE AROSS BACK AND ABDOMEN    Past Surgical History:  Procedure Laterality Date  . ABDOMINAL HYSTERECTOMY  1984  . COLONOSCOPY    . left shoulder steroid injection    . TONSILLECTOMY  1977  . TOTAL HIP ARTHROPLASTY Right 10/10/2012   Procedure: R TOTAL HIP ARTHROPLASTY ANTERIOR APPROACH;  Surgeon: Mcarthur Rossetti, MD;  Location: [facility];  Service: Orthopedics;  Laterality: Right;  Right Total Hip Arthroplasty, Anterior Approach (C-Arm)  . TOTAL KNEE ARTHROPLASTY Right     with multiple revisions, 2003, 2005  . TOTAL KNEE ARTHROPLASTY Left 01/09/2013   Procedure: LEFT TOTAL KNEE ARTHROPLASTY;  Surgeon: Mcarthur Rossetti, MD;  [facility];  Service: Orthopedics;  Laterality: Left;  . TUBAL LIGATION  1978  . WISDOM TOOTH EXTRACTION  2013,2014    There were no vitals filed for this visit.  Subjective Assessment - 05/05/18 1114    Subjective  I am just not into it today. No pain I just wanna take a nap.      Currently in Pain?  No/denies            [facility] Adult PT Treatment/Exercise - 05/05/18 0001      Lumbar Exercises: Aerobic   Nustep  LE only for 6 min L5       Lumbar Exercises: Standing   Other Standing Lumbar Exercises  sidestepping with green band x 25 feet with min HHA and then another 2 x 25 without band , cues to keep toes poiinted Forward       Knee/Hip Exercises: Standing   Heel Raises  Both;1 set;15 reps    Heel Raises Limitations  cues to avoid excessive hip motion       Knee/Hip Exercises: Seated   Marching  Strengthening;Both;1 set;10 reps    Marching Limitations  green band       Knee/Hip Exercises: Supine   Constance Haw  Strengthening;Both;2 sets;10 reps    Other Supine Knee/Hip Exercises  knee lift green band x 10 each leg     Other Supine Knee/Hip Exercises  clam green x 15       Knee/Hip Exercises: Sidelying   Hip ABduction  Strengthening;Both;1 set;15 reps    Hip ABduction Limitations  then sidekicks x 10 (flex/ext)     Clams  x 20 green                PT Short Term Goals - 04/29/18 1132      PT SHORT TERM GOAL #1   Title  Pt will be I with HEP for basic LE strengthening     Status  Achieved      PT SHORT TERM GOAL #2   Title  Pt will increase strength in LEs by 1/2 muscle grade in ankle DF, PF and hips    Baseline  hips     Status  Partially Met      PT SHORT TERM GOAL #3   Title  Pt will be screened for Balance and set goal for mobility.     Status  Achieved      PT SHORT TERM GOAL #4    Title  Pt will tolerate 15 min of bike/NuStep without significant soreness following.     Baseline  can do 7-8 min     Status  Unable to assess        PT Long Term Goals - 04/29/18 1133      PT LONG TERM GOAL #1   Title  Pt will be I with final HEP for flexibility and strength     Status  On-going      PT LONG TERM GOAL #2   Title  Pt will be able to improve ease of transfers (car, chair) by 50% as reported by patient.     Status  On-going      PT LONG TERM GOAL #3   Title  Pt will improve FOTO to less than 38% to show functional improvement     Status  On-going      PT LONG TERM GOAL #4   Title  Pt will score 42/56 on Berg Balance Test to reduce fall risk.     Status  New            Plan - 05/05/18 1245    Clinical Impression Statement  Patient worked hard despite fatigue.  Still limited in hip and ankle strength, balance confidence without UE assist.  Focused on strengthening today.  Making slow progress.      PT Treatment/Interventions  ADLs/Self Care Home Management;Electrical Stimulation;Functional mobility training;Neuromuscular re-education;Cryotherapy;Ultrasound;Moist Heat;Patient/family education;Therapeutic exercise;Manual techniques;Passive range of motion;Balance training;Gait training;DME Instruction;Therapeutic activities    PT Next Visit Plan  balance screen (sit to stand x 5, TUG, ) check HEP, Continue Nustep . standing ankle, hip .      PT Home Exercise Plan  bridging, LAQ, seated march, standing/seated heel raises and blue band plantaflexion    Consulted and Agree with Plan of Care  Patient       Patient will benefit from skilled therapeutic intervention in order to improve the following deficits and impairments:  Abnormal gait, Decreased balance, Decreased endurance, Decreased mobility, Difficulty walking, Impaired sensation, Pain, Postural dysfunction, Impaired UE functional use, Impaired flexibility, Increased fascial restricitons, Decreased strength,  Decreased activity tolerance, Decreased range of motion, Increased edema  Visit Diagnosis: Muscle weakness (generalized)  Stiffness of left knee, not  elsewhere classified  Stiffness of right knee, not elsewhere classified  Other abnormalities of gait and mobility  Sensory disturbance     Problem List Patient Active Problem List   Diagnosis Date Noted  . Obesity 01/06/2016  . Low back pain 01/06/2016  . Diabetic peripheral neuropathy ([place]) 09/01/2012  . Hyperlipemia 09/16/2006  . Essential hypertension 09/16/2006  . GERD 09/16/2006  . Type II diabetes mellitus with neurological manifestations ([place]) 06/17/2006  . Osteoarthritis 06/17/2006  . DEPENDENT EDEMA, LEGS, BILATERAL 06/17/2006    Icelyn Navarrete 05/05/2018, 12:52 PM  [facility] [address] [place], [place], [address] Phone: [phone]   Fax:  [phone]  Name: VALERYE KOBUS MRN: [id number] Date of Birth: 1951/06/27  Raeford Razor, PT 05/05/18 12:53 PM Phone: [phone] Fax: [phone]

## 2018-05-07 ENCOUNTER — Ambulatory Visit: Payer: PPO | Admitting: Physical Therapy

## 2018-05-12 ENCOUNTER — Ambulatory Visit: Payer: PPO | Admitting: Physical Therapy

## 2018-05-15 DIAGNOSIS — L817 Pigmented purpuric dermatosis: Secondary | ICD-10-CM | POA: Diagnosis not present

## 2018-05-16 ENCOUNTER — Encounter

## 2018-05-19 ENCOUNTER — Ambulatory Visit: Payer: PPO | Admitting: Physical Therapy

## 2018-05-19 ENCOUNTER — Encounter: Payer: Self-pay | Admitting: Physical Therapy

## 2018-05-19 VITALS — BP 145/69

## 2018-05-19 DIAGNOSIS — M25662 Stiffness of left knee, not elsewhere classified: Secondary | ICD-10-CM

## 2018-05-19 DIAGNOSIS — M25661 Stiffness of right knee, not elsewhere classified: Secondary | ICD-10-CM

## 2018-05-19 DIAGNOSIS — M6281 Muscle weakness (generalized): Secondary | ICD-10-CM

## 2018-05-19 DIAGNOSIS — R2689 Other abnormalities of gait and mobility: Secondary | ICD-10-CM

## 2018-05-19 DIAGNOSIS — R209 Unspecified disturbances of skin sensation: Secondary | ICD-10-CM

## 2018-05-19 NOTE — Therapy (Addendum)
[place], [place], [address] Phone: [phone]   Fax:  [phone]  Physical Therapy Treatment/Discharge  Patient Details  Name: Renee Watts MRN: [id number] Date of Birth: 10/02/1950 Referring Provider: Dr. Jean Rosenthal   Encounter Date: 05/19/2018  PT End of Session - 05/19/18 1237    Visit Number  8    Number of Visits  16    Date for PT Re-Evaluation  06/03/18    PT Start Time  1583    PT Stop Time  0940    PT Time Calculation (min)  49 min    Activity Tolerance  Patient tolerated treatment well;Other (comment)   dizzy post session    Behavior During Therapy  [facility] for tasks assessed/performed       Past Medical History:  Diagnosis Date  . Benign paroxysmal positional vertigo 06/25/2013   dx with prior PCP, s/p labs, mri and vestibular rehab; nasal spray for etd seemed to help  . Blood in stool 03/11/2012  . Dependent edema    Bilateral  . Depression   . GERD (gastroesophageal reflux disease)   . Hyperlipidemia   . Low back pain    sciatica; sees orthopedic specialist ([facility])for this and shoulder tendinitis  . Obesity, diabetes, and hypertension syndrome ([place])   . Osteoarthritis, knee    Bilateral, s/p 3 total knee replacement surgerues on right, last 2005. Dr. Tonita Cong  . Pellegrini-Steida syndrome    Myositis ossificans  . Seasonal allergies   . Shingles Jul 10 2012   LESIONS CLEARED BUT NOT TOLERATING ANY THING TIGHT AGAINST BODY--SHINGLES WERE AROSS BACK AND ABDOMEN    Past Surgical History:  Procedure Laterality Date  . ABDOMINAL HYSTERECTOMY  1984  . COLONOSCOPY    . left shoulder steroid injection    . TONSILLECTOMY  1977  . TOTAL HIP ARTHROPLASTY Right 10/10/2012   Procedure: R TOTAL HIP ARTHROPLASTY ANTERIOR APPROACH;  Surgeon: Mcarthur Rossetti, MD;  Location: [facility];  Service: Orthopedics;  Laterality: Right;  Right Total Hip Arthroplasty, Anterior  Approach (C-Arm)  . TOTAL KNEE ARTHROPLASTY Right    with multiple revisions, 2003, 2005  . TOTAL KNEE ARTHROPLASTY Left 01/09/2013   Procedure: LEFT TOTAL KNEE ARTHROPLASTY;  Surgeon: Mcarthur Rossetti, MD;  Location: [facility];  Service: Orthopedics;  Laterality: Left;  . TUBAL LIGATION  1978  . WISDOM TOOTH EXTRACTION  2013,2014    Vitals:   05/19/18 1237  BP: (!) 145/69    Subjective Assessment - 05/19/18 1238    Subjective  No pain.  Had a bad sinus infection last week, missed 1 appt.  Been doing my exercises as best could. Feels stronger.     Currently in Pain?  No/denies         [facility] PT Assessment - 05/19/18 0001      Strength   Right Hip Flexion  3-/5    Left Hip Flexion  4-/5    Right Knee Flexion  5/5    Right Knee Extension  4-/5    Left Knee Flexion  5/5    Left Knee Extension  4+/5    Right Ankle Dorsiflexion  3/5    Left Ankle Dorsiflexion  3+/5           [facility] Adult PT Treatment/Exercise - 05/19/18 0001      Self-Care   Self-Care  Other Self-Care Comments    Other Self-Care Comments   HEP, strength gains, [employer]  Knee/Hip Exercises: Stretches   Hip Flexor Stretch  Right;3 reps;30 seconds      Knee/Hip Exercises: Aerobic   Nustep  L5 LE and UE , 8 minutes       Knee/Hip Exercises: Standing   Hip Flexion  Stengthening;Both;2 sets;10 reps    Hip Flexion Limitations  1 set flexed, 1 set ext. green loop     Hip Abduction  Stengthening;Both;10 reps;2 sets    Abduction Limitations  small ROM with green band looped       Knee/Hip Exercises: Seated   Marching  Strengthening;Both;1 set;10 reps    Marching Limitations  green band       Knee/Hip Exercises: Supine   Bridges  Strengthening;Both;2 sets;10 reps    Straight Leg Raises Limitations  unable to do on Rt, march instead              PT Education - 05/19/18 1242    Education Details  Self care see flowsheet     Person(s) Educated  Patient    Methods  Explanation;Handout     Comprehension  Verbalized understanding       PT Short Term Goals - 04/29/18 1132      PT SHORT TERM GOAL #1   Title  Pt will be I with HEP for basic LE strengthening     Status  Achieved      PT SHORT TERM GOAL #2   Title  Pt will increase strength in LEs by 1/2 muscle grade in ankle DF, PF and hips    Baseline  hips     Status  Partially Met      PT SHORT TERM GOAL #3   Title  Pt will be screened for Balance and set goal for mobility.     Status  Achieved      PT SHORT TERM GOAL #4   Title  Pt will tolerate 15 min of bike/NuStep without significant soreness following.     Baseline  can do 7-8 min     Status  Unable to assess        PT Long Term Goals - 04/29/18 1133      PT LONG TERM GOAL #1   Title  Pt will be I with final HEP for flexibility and strength     Status  On-going      PT LONG TERM GOAL #2   Title  Pt will be able to improve ease of transfers (car, chair) by 50% as reported by patient.     Status  On-going      PT LONG TERM GOAL #3   Title  Pt will improve FOTO to less than 38% to show functional improvement     Status  On-going      PT LONG TERM GOAL #4   Title  Pt will score 42/56 on Berg Balance Test to reduce fall risk.     Status  New            Plan - 05/19/18 1243    Clinical Impression Statement  Pt continues to have weakness in Rt hip flexors, R quads, bilateral ankle DF.  She correlates symptoms in her feet at night with eating sugar. Weakness consistent with neurologic weakness.     PT Treatment/Interventions  ADLs/Self Care Home Management;Electrical Stimulation;Functional mobility training;Neuromuscular re-education;Cryotherapy;Ultrasound;Moist Heat;Patient/family education;Therapeutic exercise;Manual techniques;Passive range of motion;Balance training;Gait training;DME Instruction;Therapeutic activities    PT Next Visit Plan  FOTO, progress strength Continue Nustep . standing ankle, hip .  PT Home Exercise Plan  bridging,  LAQ, seated march, standing/seated heel raises and blue band plantaflexion, hip flexion seated march and LAQwith band     Consulted and Agree with Plan of Care  Patient       Patient will benefit from skilled therapeutic intervention in order to improve the following deficits and impairments:  Abnormal gait, Decreased balance, Decreased endurance, Decreased mobility, Difficulty walking, Impaired sensation, Pain, Postural dysfunction, Impaired UE functional use, Impaired flexibility, Increased fascial restricitons, Decreased strength, Decreased activity tolerance, Decreased range of motion, Increased edema  Visit Diagnosis: Muscle weakness (generalized)  Stiffness of left knee, not elsewhere classified  Stiffness of right knee, not elsewhere classified  Other abnormalities of gait and mobility  Sensory disturbance     Problem List Patient Active Problem List   Diagnosis Date Noted  . Obesity 01/06/2016  . Low back pain 01/06/2016  . Diabetic peripheral neuropathy ([place]) 09/01/2012  . Hyperlipemia 09/16/2006  . Essential hypertension 09/16/2006  . GERD 09/16/2006  . Type II diabetes mellitus with neurological manifestations ([employer]) 06/17/2006  . Osteoarthritis 06/17/2006  . DEPENDENT EDEMA, LEGS, BILATERAL 06/17/2006    Tennie Grussing 05/19/2018, 12:46 PM  [facility] [address] [place], [place], [address] Phone: [phone]   Fax:  [phone]  Name: BRANDOLYN SHORTRIDGE MRN: [id number] Date of Birth: [birth year]   Raeford Razor, PT 05/19/18 12:47 PM Phone: [phone] Fax: [phone]   PHYSICAL THERAPY DISCHARGE SUMMARY  Visits from Start of Care: 8  Current functional level related to goals / functional outcome: See above for most recent info    Remaining deficits: See above    Education / Equipment: HEP, gait, balance Plan: Patient agrees to discharge.  Patient goals were partially met. Patient is being  discharged due to not returning since the last visit.  ?????     Raeford Razor, PT 07/11/18 10:35 AM Phone: [phone] Fax: [phone]

## 2018-05-21 ENCOUNTER — Ambulatory Visit: Payer: PPO | Admitting: Physical Therapy

## 2018-05-26 ENCOUNTER — Ambulatory Visit: Payer: PPO | Admitting: Physical Therapy

## 2018-05-28 ENCOUNTER — Telehealth: Payer: Self-pay | Admitting: Physical Therapy

## 2018-05-28 ENCOUNTER — Ambulatory Visit: Payer: PPO | Attending: Orthopaedic Surgery | Admitting: Physical Therapy

## 2018-05-28 NOTE — Telephone Encounter (Signed)
Patient was a no show for her last 2 visits.  Called to check her status but was unable to reach her.  Discharge from [place], PT 05/28/18 2:16 PM Phone: [phone] Fax: [phone]

## 2018-05-29 ENCOUNTER — Ambulatory Visit (INDEPENDENT_AMBULATORY_CARE_PROVIDER_SITE_OTHER): Payer: PPO | Admitting: Family Medicine

## 2018-05-29 ENCOUNTER — Encounter: Payer: Self-pay | Admitting: Family Medicine

## 2018-05-29 VITALS — BP 110/80 | HR 72 | Temp 99.1°F | Ht 66.0 in

## 2018-05-29 DIAGNOSIS — J0100 Acute maxillary sinusitis, unspecified: Secondary | ICD-10-CM

## 2018-05-29 MED ORDER — AMOXICILLIN-POT CLAVULANATE 875-125 MG PO TABS: 1.0000 | ORAL_TABLET | Freq: Two times a day (BID) | ORAL | 0 refills | Status: DC

## 2018-05-29 NOTE — Addendum Note (Signed)
Addended by: Lucretia Kern on: 05/29/2018 11:59 AM   Modules accepted: Orders

## 2018-05-29 NOTE — Patient Instructions (Signed)
Schedule Flu shot visit in 1-2 weeks.  Take the Augmentin as prescribed.  Use nasal saline twice daily.  I hope you are feeling better soon! Seek care promptly if your symptoms worsen, new concerns arise or you are not improving with treatment.  Amoxicillin; Clavulanic Acid chewable tablets What is this medicine? AMOXICILLIN; CLAVULANIC ACID (a mox i SIL in; KLAV yoo lan ic AS id) is a penicillin antibiotic. It is used to treat certain kinds of bacterial infections. It It will not work for colds, flu, or other viral infections. This medicine may be used for other purposes; ask your health care provider or pharmacist if you have questions. COMMON BRAND NAME(S): Augmentin What should I tell my health care provider before I take this medicine? They need to know if you have any of these conditions: -bowel disease, like colitis -kidney disease -liver disease -mononucleosis -phenylketonuria -an unusual or allergic reaction to amoxicillin, penicillin, cephalosporin, other antibiotics, clavulanic acid, other medicines, foods, dyes, or preservatives -pregnant or trying to get pregnant -breast-feeding How should I use this medicine? Take this medicine by mouth. Chew it completely before swallowing. Follow the directions on the prescription label. Take this medicine at the start of a meal or snack. Take your medicine at regular intervals. Do not take your medicine more often than directed. Take all of your medicine as directed even if you think you are better. Do not skip doses or stop your medicine early. Talk to your pediatrician regarding the use of this medicine in children. While this drug may be prescribed for selected conditions, precautions do apply. Overdosage: If you think you have taken too much of this medicine contact a poison control center or emergency room at once. NOTE: This medicine is only for you. Do not share this medicine with others. What if I miss a dose? If you miss a  dose, take it as soon as you can. If it is almost time for your next dose, take only that dose. Do not take double or extra doses. What may interact with this medicine? -allopurinol -anticoagulants -birth control pills -methotrexate -probenecid This list may not describe all possible interactions. Give your health care provider a list of all the medicines, herbs, non-prescription drugs, or dietary supplements you use. Also tell them if you smoke, drink alcohol, or use illegal drugs. Some items may interact with your medicine. What should I watch for while using this medicine? Tell your doctor or health care professional if your symptoms do not improve. Do not treat diarrhea with over the counter products. Contact your doctor if you have diarrhea that lasts more than 2 days or if it is severe and watery. If you have diabetes, you may get a false-positive result for sugar in your urine. Check with your doctor or health care professional. Birth control pills may not work properly while you are taking this medicine. Talk to your doctor about using an extra method of birth control. What side effects may I notice from receiving this medicine? Side effects that you should report to your doctor or health care professional as soon as possible: -allergic reactions like skin rash, itching or hives, swelling of the face, lips, or tongue -breathing problems -dark urine -fever or chills, sore throat -redness, blistering, peeling or loosening of the skin, including inside the mouth -seizures -trouble passing urine or change in the amount of urine -unusual bleeding, bruising -unusually weak or tired -white patches or sores in the mouth or throat Side effects that usually  do not require medical attention (report to your doctor or health care professional if they continue or are bothersome): -diarrhea -dizziness -headache -nausea, vomiting -stomach upset -vaginal or anal irritation This list may not  describe all possible side effects. Call your doctor for medical advice about side effects. You may report side effects to FDA at 1-800-FDA-1088. Where should I keep my medicine? Keep out of the reach of children. Store at room temperature below 25 degrees C (77 degrees F). Keep container tightly closed. Throw away any unused medicine after the expiration date. NOTE: This sheet is a summary. It may not cover all possible information. If you have questions about this medicine, talk to your doctor, pharmacist, or health care provider.  2018 Elsevier/Gold Standard (2007-11-06 11:38:22)

## 2018-05-29 NOTE — Progress Notes (Signed)
HPI:  Using dictation device. Unfortunately this device frequently misinterprets words/phrases.   Acute visit for respiratory illness: -started: 2-3 weeks ago and worsening -symptoms:nasal congestion, sore throat, cough, now ear discomfort bilat, R max sinus pain worse with moving head, thick green nasal congestion and pnd -denies:fever, SOB, NVD, tooth pain -has tried: allergy meds, not using flonase as burns in nose -sick contacts/travel/risks: no reported flu, strep or tick exposure  ROS: See pertinent positives and negatives per HPI.  Past Medical History:  Diagnosis Date  . Benign paroxysmal positional vertigo 06/25/2013   dx with prior PCP, s/p labs, mri and vestibular rehab; nasal spray for etd seemed to help  . Blood in stool 03/11/2012  . Dependent edema    Bilateral  . Depression   . GERD (gastroesophageal reflux disease)   . Hyperlipidemia   . Low back pain    sciatica; sees orthopedic specialist ([facility])for this and shoulder tendinitis  . Obesity, diabetes, and hypertension syndrome ([place])   . Osteoarthritis, knee    Bilateral, s/p 3 total knee replacement surgerues on right, last 2005. Dr. Tonita Cong  . Pellegrini-Steida syndrome    Myositis ossificans  . Seasonal allergies   . Shingles Jul 10 2012   LESIONS CLEARED BUT NOT TOLERATING ANY THING TIGHT AGAINST BODY--SHINGLES WERE AROSS BACK AND ABDOMEN    Past Surgical History:  Procedure Laterality Date  . ABDOMINAL HYSTERECTOMY  1984  . COLONOSCOPY    . left shoulder steroid injection    . TONSILLECTOMY  1977  . TOTAL HIP ARTHROPLASTY Right 10/10/2012   Procedure: R TOTAL HIP ARTHROPLASTY ANTERIOR APPROACH;  Surgeon: Mcarthur Rossetti, MD;  Location: [facility];  Service: Orthopedics;  Laterality: Right;  Right Total Hip Arthroplasty, Anterior Approach (C-Arm)  . TOTAL KNEE ARTHROPLASTY Right    with multiple revisions, 2003, 2005  . TOTAL KNEE ARTHROPLASTY Left 01/09/2013   Procedure: LEFT TOTAL KNEE  ARTHROPLASTY;  Surgeon: Mcarthur Rossetti, MD;  Location: [facility];  Service: Orthopedics;  Laterality: Left;  . TUBAL LIGATION  1978  . WISDOM TOOTH EXTRACTION  2013,2014    Family History  Problem Relation Age of Onset  . Diabetes Mother   . Hypertension Mother   . Hypertension Father   . Cancer Father        Lung CA  . Coronary artery disease Father   . Colon cancer Neg Hx     Social History   Socioeconomic History  . Marital status: Married    Spouse name: Not on file  . Number of children: Not on file  . Years of education: 12th grade  . Highest education level: Not on file  Occupational History    Employer: [employer]  Social Needs  . Financial resource strain: Not on file  . Food insecurity:    Worry: Not on file    Inability: Not on file  . Transportation needs:    Medical: Not on file    Non-medical: Not on file  Tobacco Use  . Smoking status: Never Smoker  . Smokeless tobacco: Never Used  Substance and Sexual Activity  . Alcohol use: No  . Drug use: No  . Sexual activity: Not on file  Lifestyle  . Physical activity:    Days per week: Not on file    Minutes per session: Not on file  . Stress: Not on file  Relationships  . Social connections:    Talks on phone: Not on file    Gets  together: Not on file    Attends religious service: Not on file    Active member of club or organization: Not on file    Attends meetings of clubs or organizations: Not on file    Relationship status: Not on file  Other Topics Concern  . Not on file  Social History Narrative   Married, 3 daughters.   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  aspirin 81 MG tablet, Take 81 mg by mouth daily., Disp: , Rfl:  .  Blood Glucose Monitoring Suppl (ONETOUCH VERIO IQ SYSTEM) W/DEVICE KIT, by Does not apply route., Disp: , Rfl:  .  cetirizine (ZYRTEC) 10 MG  tablet, Take 10 mg by mouth at bedtime., Disp: , Rfl:  .  fluticasone (FLONASE) 50 MCG/ACT nasal spray, SHAKE LIQUID AND USE 2 SPRAYS IN EACH NOSTRIL DAILY, Disp: 16 g, Rfl: 5 .  furosemide (LASIX) 40 MG tablet, TAKE 1/2 TABLET(20 MG) BY MOUTH DAILY, Disp: 30 tablet, Rfl: 5 .  gabapentin (NEURONTIN) 300 MG capsule, TAKE 1 CAPSULE(300 MG) BY MOUTH TWICE DAILY, Disp: 180 capsule, Rfl: 2 .  glipiZIDE (GLUCOTROL) 5 MG tablet, TAKE 1 TABLET(5 MG) BY MOUTH TWICE DAILY BEFORE A MEAL, Disp: 60 tablet, Rfl: 5 .  glucose blood (ONETOUCH VERIO) test strip, USE AS DIRECTED TO TEST BLOOD SUGAR ONCE DAILY, Disp: 100 each, Rfl: 3 .  glucose blood test strip, Use as directed to check blood sugar once a day, Disp: 100 each, Rfl: 3 .  lisinopril (PRINIVIL,ZESTRIL) 20 MG tablet, TAKE 1 TABLET BY MOUTH EVERY DAY, Disp: 90 tablet, Rfl: 1 .  metFORMIN (GLUCOPHAGE) 1000 MG tablet, TAKE 1 TABLET BY MOUTH TWICE DAILY WITH A MEAL, Disp: 180 tablet, Rfl: 1 .  omeprazole (PRILOSEC) 10 MG capsule, Take 10 mg by mouth daily as needed., Disp: , Rfl:  .  pravastatin (PRAVACHOL) 40 MG tablet, TAKE 1 AND 1/2 TABLETS(60 MG) BY MOUTH DAILY, Disp: 135 tablet, Rfl: 1  EXAM:  Vitals:   05/29/18 1011  BP: 110/80  Pulse: 72  Temp: 99.1 F (37.3 C)  SpO2: 98%    Body mass index is 28.73 kg/m.  GENERAL: vitals reviewed and listed above, alert, oriented, appears well hydrated and in no acute distress  HEENT: atraumatic, conjunttiva clear, no obvious abnormalities on inspection of external nose and ears, normal appearance of ear canals and TMs, thick nasal congestion, mild post oropharyngeal erythema with PND, no tonsillar edema or exudate, no sinus TTP  NECK: no obvious masses on inspection  LUNGS: clear to auscultation bilaterally, no wheezes, rales or rhonchi, good air movement  CV: HRRR, no peripheral edema  MS: moves all extremities without noticeable abnormality  PSYCH: pleasant and cooperative, no obvious depression  or anxiety  ASSESSMENT AND PLAN:  Discussed the following assessment and plan:  Acute non-recurrent maxillary sinusitis  - We discussed potential etiologies, with VURI or AR--> sinusitis being most likely.. We discussed treatment side effects, likely course, antibiotic misuse, transmission, and signs of developing a serious illness. -sent Augmentin to the pharmacy after discussion risks/enefits -of course, we advised to return or notify a doctor immediately if symptoms worsen or persist or new concerns arise.    Patient Instructions  Schedule Flu shot visit in 1-2 weeks.  Take the Augmentin as prescribed.  Use nasal saline twice daily.  I hope you  are feeling better soon! Seek care promptly if your symptoms worsen, new concerns arise or you are not improving with treatment.  Amoxicillin; Clavulanic Acid chewable tablets What is this medicine? AMOXICILLIN; CLAVULANIC ACID (a mox i SIL in; KLAV yoo lan ic AS id) is a penicillin antibiotic. It is used to treat certain kinds of bacterial infections. It It will not work for colds, flu, or other viral infections. This medicine may be used for other purposes; ask your health care provider or pharmacist if you have questions. COMMON BRAND NAME(S): Augmentin What should I tell my health care provider before I take this medicine? They need to know if you have any of these conditions: -bowel disease, like colitis -kidney disease -liver disease -mononucleosis -phenylketonuria -an unusual or allergic reaction to amoxicillin, penicillin, cephalosporin, other antibiotics, clavulanic acid, other medicines, foods, dyes, or preservatives -pregnant or trying to get pregnant -breast-feeding How should I use this medicine? Take this medicine by mouth. Chew it completely before swallowing. Follow the directions on the prescription label. Take this medicine at the start of a meal or snack. Take your medicine at regular intervals. Do not take your  medicine more often than directed. Take all of your medicine as directed even if you think you are better. Do not skip doses or stop your medicine early. Talk to your pediatrician regarding the use of this medicine in children. While this drug may be prescribed for selected conditions, precautions do apply. Overdosage: If you think you have taken too much of this medicine contact a poison control center or emergency room at once. NOTE: This medicine is only for you. Do not share this medicine with others. What if I miss a dose? If you miss a dose, take it as soon as you can. If it is almost time for your next dose, take only that dose. Do not take double or extra doses. What may interact with this medicine? -allopurinol -anticoagulants -birth control pills -methotrexate -probenecid This list may not describe all possible interactions. Give your health care provider a list of all the medicines, herbs, non-prescription drugs, or dietary supplements you use. Also tell them if you smoke, drink alcohol, or use illegal drugs. Some items may interact with your medicine. What should I watch for while using this medicine? Tell your doctor or health care professional if your symptoms do not improve. Do not treat diarrhea with over the counter products. Contact your doctor if you have diarrhea that lasts more than 2 days or if it is severe and watery. If you have diabetes, you may get a false-positive result for sugar in your urine. Check with your doctor or health care professional. Birth control pills may not work properly while you are taking this medicine. Talk to your doctor about using an extra method of birth control. What side effects may I notice from receiving this medicine? Side effects that you should report to your doctor or health care professional as soon as possible: -allergic reactions like skin rash, itching or hives, swelling of the face, lips, or tongue -breathing problems -dark  urine -fever or chills, sore throat -redness, blistering, peeling or loosening of the skin, including inside the mouth -seizures -trouble passing urine or change in the amount of urine -unusual bleeding, bruising -unusually weak or tired -white patches or sores in the mouth or throat Side effects that usually do not require medical attention (report to your doctor or health care professional if they continue or are bothersome): -diarrhea -dizziness -headache -  nausea, vomiting -stomach upset -vaginal or anal irritation This list may not describe all possible side effects. Call your doctor for medical advice about side effects. You may report side effects to FDA at 1-800-FDA-1088. Where should I keep my medicine? Keep out of the reach of children. Store at room temperature below 25 degrees C (77 degrees F). Keep container tightly closed. Throw away any unused medicine after the expiration date. NOTE: This sheet is a summary. It may not cover all possible information. If you have questions about this medicine, talk to your doctor, pharmacist, or health care provider.  2018 Elsevier/Gold Standard (2007-11-06 11:38:22)     Lucretia Kern, DO

## 2018-06-02 ENCOUNTER — Encounter: Payer: Self-pay | Admitting: Family Medicine

## 2018-06-02 ENCOUNTER — Ambulatory Visit (INDEPENDENT_AMBULATORY_CARE_PROVIDER_SITE_OTHER): Payer: PPO | Admitting: Family Medicine

## 2018-06-02 VITALS — BP 102/60 | HR 74 | Temp 98.8°F | Wt 179.0 lb

## 2018-06-02 DIAGNOSIS — J0100 Acute maxillary sinusitis, unspecified: Secondary | ICD-10-CM | POA: Diagnosis not present

## 2018-06-02 DIAGNOSIS — H1031 Unspecified acute conjunctivitis, right eye: Secondary | ICD-10-CM

## 2018-06-02 NOTE — Progress Notes (Signed)
Subjective:    Patient ID: Renee Watts, female    DOB: [birth year], 67 y.o.   MRN: [id number]  No chief complaint on file.   HPI Patient was seen today for ongoing concern.  Pt seen 10/3 for sinus infection, given Augmentin and nasal saline.  Pt states sinus infection symptoms improving, but unable to use nasal spray 2/2 burning sensation in nares with use.  Pt also notes her R eye began turning pink then red on Saturday.  Pt notes more redness in the evening and itching.  Pt tried warm compress. Pt denies drainage, crusting of the eyelid or lashes, fever, chills, eye pain.  Past Medical History:  Diagnosis Date  . Benign paroxysmal positional vertigo 06/25/2013   dx with prior PCP, s/p labs, mri and vestibular rehab; nasal spray for etd seemed to help  . Blood in stool 03/11/2012  . Dependent edema    Bilateral  . Depression   . GERD (gastroesophageal reflux disease)   . Hyperlipidemia   . Low back pain    sciatica; sees orthopedic specialist ([facility])for this and shoulder tendinitis  . Obesity, diabetes, and hypertension syndrome ([place])   . Osteoarthritis, knee    Bilateral, s/p 3 total knee replacement surgerues on right, last 2005. Dr. Tonita Cong  . Pellegrini-Steida syndrome    Myositis ossificans  . Seasonal allergies   . Shingles Jul 10 2012   LESIONS CLEARED BUT NOT TOLERATING ANY THING TIGHT AGAINST BODY--SHINGLES WERE AROSS BACK AND ABDOMEN    No Known Allergies  ROS General: Denies fever, chills, night sweats, changes in weight, changes in appetite HEENT: Denies headaches, ear pain, changes in vision, rhinorrhea, sore throat  +red eye, sinus infection CV: Denies CP, palpitations, SOB, orthopnea Pulm: Denies SOB, cough, wheezing GI: Denies abdominal pain, nausea, vomiting, diarrhea, constipation GU: Denies dysuria, hematuria, frequency, vaginal discharge Msk: Denies muscle cramps, joint pains Neuro: Denies weakness, numbness, tingling Skin: Denies rashes,  bruising Psych: Denies depression, anxiety, hallucinations     Objective:    Blood pressure 102/60, pulse 74, temperature 98.8 F (37.1 C), temperature source Oral, weight 179 lb (81.2 kg), SpO2 98 %.   Gen. Pleasant, well-nourished, in no distress, normal affect   HEENT: [place]/AT, face symmetric, mild conjunctiva injection R eye, no drainage, no scleral icterus, PERRLA, EOMI, nares patent without drainage, pharynx without erythema or exudate. Lungs: no accessory muscle use Cardiovascular: RRR, no peripheral edema Neuro:  A&Ox3, CN II-XII intact, ambulates with a cane   Wt Readings from Last 3 Encounters:  06/02/18 179 lb (81.2 kg)  04/14/18 178 lb (80.7 kg)  04/14/18 178 lb 8 oz (81 kg)    Lab Results  Component Value Date   WBC 5.4 04/14/2018   HGB 12.1 04/14/2018   HCT 36.1 04/14/2018   PLT 170.0 04/14/2018   GLUCOSE 92 12/12/2017   CHOL 184 04/14/2018   TRIG 142.0 04/12/2017   HDL 42.00 04/14/2018   LDLDIRECT 137.8 10/12/2013   LDLCALC 74 04/12/2017   ALT 15 09/10/2012   AST 16 09/10/2012   NA 138 12/12/2017   K 3.6 12/12/2017   CL 103 12/12/2017   CREATININE 0.42 12/12/2017   BUN 8 12/12/2017   CO2 29 12/12/2017   TSH 1.20 05/10/2015   INR 0.94 01/05/2013   HGBA1C 6.4 04/14/2018   MICROALBUR 0.5 03/11/2014    Assessment/Plan:  Acute conjunctivitis of right eye, unspecified acute conjunctivitis type -discussed various causes of conjunctivitis. -likely 2/2 ongoing sinus infection -discussed continuing Augmentin  to complete 10 day course -discussed using OTC eye drop to help with itching -discussed frequent handwashing -given handout  Acute non-recurrent maxillary sinusitis -continue Augmentin for 10 day total course -consider saline nasal spray  F/u prn  Grier Mitts, MD

## 2018-06-02 NOTE — Patient Instructions (Addendum)
You can use an over the counter eye drop like Visine to help with your symptoms.  Bacterial Conjunctivitis Bacterial conjunctivitis is an infection of the clear membrane that covers the white part of your eye and the inner surface of your eyelid (conjunctiva). When the blood vessels in your conjunctiva become inflamed, your eye becomes red or pink, and it will probably feel itchy. Bacterial conjunctivitis spreads very easily from person to person (is contagious). It also spreads easily from one eye to the other eye. What are the causes? This condition is caused by several common bacteria. You may get the infection if you come into close contact with another person who is infected. You may also come into contact with items that are contaminated with the bacteria, such as a face towel, contact lens solution, or eye makeup. What increases the risk? This condition is more likely to develop in people who:  Are exposed to other people who have the infection.  Wear contact lenses.  Have a sinus infection.  Have had a recent eye injury or surgery.  Have a weak body defense system (immune system).  Have a medical condition that causes dry eyes.  What are the signs or symptoms? Symptoms of this condition include:  Eye redness.  Tearing or watery eyes.  Itchy eyes.  Burning feeling in your eyes.  Thick, yellowish discharge from an eye. This may turn into a crust on the eyelid overnight and cause your eyelids to stick together.  Swollen eyelids.  Blurred vision.  How is this diagnosed? Your health care provider can diagnose this condition based on your symptoms and medical history. Your health care provider may also take a sample of discharge from your eye to find the cause of your infection. This is rarely done. How is this treated? Treatment for this condition includes:  Antibiotic eye drops or ointment to clear the infection more quickly and prevent the spread of infection to  others.  Oral antibiotic medicines to treat infections that do not respond to drops or ointments, or last longer than 10 days.  Cool, wet cloths (cool compresses) placed on the eyes.  Artificial tears applied 2-6 times a day.  Follow these instructions at home: Medicines  Take or apply your antibiotic medicine as told by your health care provider. Do not stop taking or applying the antibiotic even if you start to feel better.  Take or apply over-the-counter and prescription medicines only as told by your health care provider.  Be very careful to avoid touching the edge of your eyelid with the eye drop bottle or the ointment tube when you apply medicines to the affected eye. This will keep you from spreading the infection to your other eye or to other people. Managing discomfort  Gently wipe away any drainage from your eye with a warm, wet washcloth or a cotton ball.  Apply a cool, clean washcloth to your eye for 10-20 minutes, 3-4 times a day. General instructions  Do not wear contact lenses until the inflammation is gone and your health care provider says it is safe to wear them again. Ask your health care provider how to sterilize or replace your contact lenses before you use them again. Wear glasses until you can resume wearing contacts.  Avoid wearing eye makeup until the inflammation is gone. Throw away any old eye cosmetics that may be contaminated.  Change or wash your pillowcase every day.  Do not share towels or washcloths. This may spread the infection.  Wash your hands often with soap and water. Use paper towels to dry your hands.  Avoid touching or rubbing your eyes.  Do not drive or use heavy machinery if your vision is blurred. Contact a health care provider if:  You have a fever.  Your symptoms do not get better after 10 days. Get help right away if:  You have a fever and your symptoms suddenly get worse.  You have severe pain when you move your eye.  You  have facial pain, redness, or swelling.  You have sudden loss of vision. This information is not intended to replace advice given to you by your health care provider. Make sure you discuss any questions you have with your health care provider. Document Released: 08/13/2005 Document Revised: 12/22/2015 Document Reviewed: 05/26/2015 Elsevier Interactive Patient Education  2017 [employer].

## 2018-06-10 DIAGNOSIS — Z7984 Long term (current) use of oral hypoglycemic drugs: Secondary | ICD-10-CM | POA: Diagnosis not present

## 2018-06-10 DIAGNOSIS — H524 Presbyopia: Secondary | ICD-10-CM | POA: Diagnosis not present

## 2018-06-10 DIAGNOSIS — E119 Type 2 diabetes mellitus without complications: Secondary | ICD-10-CM | POA: Diagnosis not present

## 2018-06-10 DIAGNOSIS — I10 Essential (primary) hypertension: Secondary | ICD-10-CM | POA: Diagnosis not present

## 2018-06-10 DIAGNOSIS — H52222 Regular astigmatism, left eye: Secondary | ICD-10-CM | POA: Diagnosis not present

## 2018-06-10 DIAGNOSIS — H25093 Other age-related incipient cataract, bilateral: Secondary | ICD-10-CM | POA: Diagnosis not present

## 2018-06-10 DIAGNOSIS — H5203 Hypermetropia, bilateral: Secondary | ICD-10-CM | POA: Diagnosis not present

## 2018-06-10 LAB — HM DIABETES EYE EXAM

## 2018-06-11 ENCOUNTER — Ambulatory Visit (INDEPENDENT_AMBULATORY_CARE_PROVIDER_SITE_OTHER): Payer: PPO

## 2018-06-11 ENCOUNTER — Telehealth: Payer: Self-pay | Admitting: *Deleted

## 2018-06-11 DIAGNOSIS — Z23 Encounter for immunization: Secondary | ICD-10-CM

## 2018-06-11 NOTE — Telephone Encounter (Signed)
Patient came in for a flu shot today and stated she fell on Sunday 10/13.  Patient denies any dizziness or other symptoms, nor tripping over anything prior to falling and states she just recalls being on the floor.  Stated her husband asked her if she was OK after the fall, offered to take her to the hospital and she declined.  Stated she broke her glasses and now notices a bruise on the left chin and near the left eye.  I offered to have someone see her today and/or tomorrow with Dr Maudie Mercury and she declined as she is feeling fine and felt this was due to her previous sinus issues.  Message sent to Dr Maudie Mercury as Juluis Rainier.

## 2018-07-07 ENCOUNTER — Ambulatory Visit (INDEPENDENT_AMBULATORY_CARE_PROVIDER_SITE_OTHER): Payer: PPO | Admitting: Family Medicine

## 2018-07-07 ENCOUNTER — Encounter: Payer: Self-pay | Admitting: Family Medicine

## 2018-07-07 VITALS — BP 128/88 | HR 70 | Temp 98.9°F | Wt 183.0 lb

## 2018-07-07 DIAGNOSIS — R42 Dizziness and giddiness: Secondary | ICD-10-CM | POA: Diagnosis not present

## 2018-07-07 DIAGNOSIS — J0100 Acute maxillary sinusitis, unspecified: Secondary | ICD-10-CM | POA: Diagnosis not present

## 2018-07-07 MED ORDER — AZITHROMYCIN 250 MG PO TABS: ORAL_TABLET | ORAL | 0 refills | Status: DC

## 2018-07-07 NOTE — Patient Instructions (Addendum)
Sinusitis, Adult Sinusitis is soreness and inflammation of your sinuses. Sinuses are hollow spaces in the bones around your face. Your sinuses are located:  Around your eyes.  In the middle of your forehead.  Behind your nose.  In your cheekbones.  Your sinuses and nasal passages are lined with a stringy fluid (mucus). Mucus normally drains out of your sinuses. When your nasal tissues become inflamed or swollen, the mucus can become trapped or blocked so air cannot flow through your sinuses. This allows bacteria, viruses, and funguses to grow, which leads to infection. Sinusitis can develop quickly and last for 7?10 days (acute) or for more than 12 weeks (chronic). Sinusitis often develops after a cold. What are the causes? This condition is caused by anything that creates swelling in the sinuses or stops mucus from draining, including:  Allergies.  Asthma.  Bacterial or viral infection.  Abnormally shaped bones between the nasal passages.  Nasal growths that contain mucus (nasal polyps).  Narrow sinus openings.  Pollutants, such as chemicals or irritants in the air.  A foreign object stuck in the nose.  A fungal infection. This is rare.  What increases the risk? The following factors may make you more likely to develop this condition:  Having allergies or asthma.  Having had a recent cold or respiratory tract infection.  Having structural deformities or blockages in your nose or sinuses.  Having a weak immune system.  Doing a lot of swimming or diving.  Overusing nasal sprays.  Smoking.  What are the signs or symptoms? The main symptoms of this condition are pain and a feeling of pressure around the affected sinuses. Other symptoms include:  Upper toothache.  Earache.  Headache.  Bad breath.  Decreased sense of smell and taste.  A cough that may get worse at night.  Fatigue.  Fever.  Thick drainage from your nose. The drainage is often green and  it may contain pus (purulent).  Stuffy nose or congestion.  Postnasal drip. This is when extra mucus collects in the throat or back of the nose.  Swelling and warmth over the affected sinuses.  Sore throat.  Sensitivity to light.  How is this diagnosed? This condition is diagnosed based on symptoms, a medical history, and a physical exam. To find out if your condition is acute or chronic, your health care provider may:  Look in your nose for signs of nasal polyps.  Tap over the affected sinus to check for signs of infection.  View the inside of your sinuses using an imaging device that has a light attached (endoscope).  If your health care provider suspects that you have chronic sinusitis, you may also:  Be tested for allergies.  Have a sample of mucus taken from your nose (nasal culture) and checked for bacteria.  Have a mucus sample examined to see if your sinusitis is related to an allergy.  If your sinusitis does not respond to treatment and it lasts longer than 8 weeks, you may have an MRI or CT scan to check your sinuses. These scans also help to determine how severe your infection is. In rare cases, a bone biopsy may be done to rule out more serious types of fungal sinus disease. How is this treated? Treatment for sinusitis depends on the cause and whether your condition is chronic or acute. If a virus is causing your sinusitis, your symptoms will go away on their own within 10 days. You may be given medicines to relieve your symptoms,   including:  Topical nasal decongestants. They shrink swollen nasal passages and let mucus drain from your sinuses.  Antihistamines. These drugs block inflammation that is triggered by allergies. This can help to ease swelling in your nose and sinuses.  Topical nasal corticosteroids. These are nasal sprays that ease inflammation and swelling in your nose and sinuses.  Nasal saline washes. These rinses can help to get rid of thick mucus in  your nose.  If your condition is caused by bacteria, you will be given an antibiotic medicine. If your condition is caused by a fungus, you will be given an antifungal medicine. Surgery may be needed to correct underlying conditions, such as narrow nasal passages. Surgery may also be needed to remove polyps. Follow these instructions at home: Medicines  Take, use, or apply over-the-counter and prescription medicines only as told by your health care provider. These may include nasal sprays.  If you were prescribed an antibiotic medicine, take it as told by your health care provider. Do not stop taking the antibiotic even if you start to feel better. Hydrate and Humidify  Drink enough water to keep your urine clear or pale yellow. Staying hydrated will help to thin your mucus.  Use a cool mist humidifier to keep the humidity level in your home above 50%.  Inhale steam for 10-15 minutes, 3-4 times a day or as told by your health care provider. You can do this in the bathroom while a hot shower is running.  Limit your exposure to cool or dry air. Rest  Rest as much as possible.  Sleep with your head raised (elevated).  Make sure to get enough sleep each night. General instructions  Apply a warm, moist washcloth to your face 3-4 times a day or as told by your health care provider. This will help with discomfort.  Wash your hands often with soap and water to reduce your exposure to viruses and other germs. If soap and water are not available, use hand sanitizer.  Do not smoke. Avoid being around people who are smoking (secondhand smoke).  Keep all follow-up visits as told by your health care provider. This is important. Contact a health care provider if:  You have a fever.  Your symptoms get worse.  Your symptoms do not improve within 10 days. Get help right away if:  You have a severe headache.  You have persistent vomiting.  You have pain or swelling around your face or  eyes.  You have vision problems.  You develop confusion.  Your neck is stiff.  You have trouble breathing. This information is not intended to replace advice given to you by your health care provider. Make sure you discuss any questions you have with your health care provider. Document Released: 08/13/2005 Document Revised: 04/08/2016 Document Reviewed: 06/08/2015 Elsevier Interactive Patient Education  2018 [employer].  Dizziness Dizziness is a common problem. It is a feeling of unsteadiness or light-headedness. You may feel like you are about to faint. Dizziness can lead to injury if you stumble or fall. Anyone can become dizzy, but dizziness is more common in older adults. This condition can be caused by a number of things, including medicines, dehydration, or illness. Follow these instructions at home: Eating and drinking  Drink enough fluid to keep your urine clear or pale yellow. This helps to keep you from becoming dehydrated. Try to drink more clear fluids, such as water.  Do not drink alcohol.  Limit your caffeine intake if told to do  so by your health care provider. Check ingredients and nutrition facts to see if a food or beverage contains caffeine.  Limit your salt (sodium) intake if told to do so by your health care provider. Check ingredients and nutrition facts to see if a food or beverage contains sodium. Activity  Avoid making quick movements. ? Rise slowly from chairs and steady yourself until you feel okay. ? In the morning, first sit up on the side of the bed. When you feel okay, stand slowly while you hold onto something until you know that your balance is fine.  If you need to stand in one place for a long time, move your legs often. Tighten and relax the muscles in your legs while you are standing.  Do not drive or use heavy machinery if you feel dizzy.  Avoid bending down if you feel dizzy. Place items in your home so that they are easy for you to reach  without leaning over. Lifestyle  Do not use any products that contain nicotine or tobacco, such as cigarettes and e-cigarettes. If you need help quitting, ask your health care provider.  Try to reduce your stress level by using methods such as yoga or meditation. Talk with your health care provider if you need help to manage your stress. General instructions  Watch your dizziness for any changes.  Take over-the-counter and prescription medicines only as told by your health care provider. Talk with your health care provider if you think that your dizziness is caused by a medicine that you are taking.  Tell a friend or a family member that you are feeling dizzy. If he or she notices any changes in your behavior, have this person call your health care provider.  Keep all follow-up visits as told by your health care provider. This is important. Contact a health care provider if:  Your dizziness does not go away.  Your dizziness or light-headedness gets worse.  You feel nauseous.  You have reduced hearing.  You have new symptoms.  You are unsteady on your feet or you feel like the room is spinning. Get help right away if:  You vomit or have diarrhea and are unable to eat or drink anything.  You have problems talking, walking, swallowing, or using your arms, hands, or legs.  You feel generally weak.  You are not thinking clearly or you have trouble forming sentences. It may take a friend or family member to notice this.  You have chest pain, abdominal pain, shortness of breath, or sweating.  Your vision changes.  You have any bleeding.  You have a severe headache.  You have neck pain or a stiff neck.  You have a fever. These symptoms may represent a serious problem that is an emergency. Do not wait to see if the symptoms will go away. Get medical help right away. Call your local emergency services (911 in the [place] Do not drive yourself to the  hospital. Summary  Dizziness is a feeling of unsteadiness or light-headedness. This condition can be caused by a number of things, including medicines, dehydration, or illness.  Anyone can become dizzy, but dizziness is more common in older adults.  Drink enough fluid to keep your urine clear or pale yellow. Do not drink alcohol.  Avoid making quick movements if you feel dizzy. Monitor your dizziness for any changes. This information is not intended to replace advice given to you by your health care provider. Make sure you discuss any questions you  have with your health care provider. Document Released: 02/06/2001 Document Revised: 09/15/2016 Document Reviewed: 09/15/2016 Elsevier Interactive Patient Education  [employer].

## 2018-07-07 NOTE — Progress Notes (Signed)
Subjective:    Patient ID: Renee Watts, female    DOB: 1951-05-24, 67 y.o.   MRN: [id number]  No chief complaint on file.   HPI Patient was seen today for ongoing concern.  Pt endorses ear pressure, dizziness, headaches and pain around her eyes.  Pt seen for sinusitis on 10/3 and feels like symptoms never improved after 10 days of Augmentin.  Pt was also seen 10/7 for conjunctivitis.  Pt has taken Zyrtec and Flonase for her symptoms.  Past Medical History:  Diagnosis Date  . Benign paroxysmal positional vertigo 06/25/2013   dx with prior PCP, s/p labs, mri and vestibular rehab; nasal spray for etd seemed to help  . Blood in stool 03/11/2012  . Dependent edema    Bilateral  . Depression   . GERD (gastroesophageal reflux disease)   . Hyperlipidemia   . Low back pain    sciatica; sees orthopedic specialist ([facility])for this and shoulder tendinitis  . Obesity, diabetes, and hypertension syndrome ([place])   . Osteoarthritis, knee    Bilateral, s/p 3 total knee replacement surgerues on right, last 2005. Dr. Tonita Cong  . Pellegrini-Steida syndrome    Myositis ossificans  . Seasonal allergies   . Shingles Jul 10 2012   LESIONS CLEARED BUT NOT TOLERATING ANY THING TIGHT AGAINST BODY--SHINGLES WERE AROSS BACK AND ABDOMEN    No Known Allergies  ROS General: Denies fever, chills, night sweats, changes in weight, changes in appetite + dizziness HEENT: Denies ear pain, changes in vision, rhinorrhea, sore throat  +HA, ear pressure, pressure around eyes CV: Denies CP, palpitations, SOB, orthopnea Pulm: Denies SOB, cough, wheezing GI: Denies abdominal pain, nausea, vomiting, diarrhea, constipation GU: Denies dysuria, hematuria, frequency, vaginal discharge Msk: Denies muscle cramps, joint pains Neuro: Denies weakness, numbness, tingling Skin: Denies rashes, bruising Psych: Denies depression, anxiety, hallucinations    Objective:    Blood pressure 128/88, pulse 70, temperature 98.9  F (37.2 C), temperature source Oral, weight 183 lb (83 kg), SpO2 98 %.  Gen. Pleasant, well-nourished, in no distress, normal affect  HEENT: [place]/AT, face symmetric, conjunctiva clear, no scleral icterus, PERRLA, nares patent without drainage, pharynx without erythema or exudate.  TMs normal b/l.  No cervical lymphadenopathy.  TTP of maxillary and ethmoid sinuses. Lungs: no accessory muscle use, CTAB, no wheezes or rales Cardiovascular: RRR, no m/r/g, no peripheral edema Neuro:  A&Ox3, CN II-XII intact, ambulating with the assistance of cane.  Wt Readings from Last 3 Encounters:  07/07/18 183 lb (83 kg)  06/02/18 179 lb (81.2 kg)  04/14/18 178 lb (80.7 kg)    Lab Results  Component Value Date   WBC 5.4 04/14/2018   HGB 12.1 04/14/2018   HCT 36.1 04/14/2018   PLT 170.0 04/14/2018   GLUCOSE 92 12/12/2017   CHOL 184 04/14/2018   TRIG 142.0 04/12/2017   HDL 42.00 04/14/2018   LDLDIRECT 137.8 10/12/2013   LDLCALC 74 04/12/2017   ALT 15 09/10/2012   AST 16 09/10/2012   NA 138 12/12/2017   K 3.6 12/12/2017   CL 103 12/12/2017   CREATININE 0.42 12/12/2017   BUN 8 12/12/2017   CO2 29 12/12/2017   TSH 1.20 05/10/2015   INR 0.94 01/05/2013   HGBA1C 6.4 04/14/2018   MICROALBUR 0.5 03/11/2014    Assessment/Plan:  Subacute maxillary sinusitis -Completed Augmentin 10 d course at the beginning of October. -We will change antibiotic -Given handout -Okay to continue Flonase and allergy pill.  - Plan: azithromycin (ZITHROMAX) 250 MG  tablet  Dizziness -likely 2/2 sinusitis -discussed various causes dehydration, illness, meds, etc. -pt advised to use caution when moving from laying, to sitting, to standing positions or turning corners. -continue staying hydrated -given handout  F/u prn  Grier Mitts, MD

## 2018-07-13 ENCOUNTER — Other Ambulatory Visit: Payer: Self-pay | Admitting: Family Medicine

## 2018-08-13 ENCOUNTER — Encounter: Payer: Self-pay | Admitting: Family Medicine

## 2018-08-13 NOTE — Progress Notes (Signed)
HPI:  Using dictation device. Unfortunately this device frequently misinterprets words/phrases.  Renee Watts is a pleasant 67 y.o. here for follow up. Chronic medical problems summarized below were reviewed for changes and stability and were updated as needed below. These issues and their treatment remain stable for the most part. She wants to recheck the B12 -taking. She has been seeing Dr. Ninfa Linden for "weak legs". Reports evaluation was negative and she started PT which helped - but then stopped it early and still having this problem. Plans to follow up with Dr. Ninfa Linden - no pain, fevers, malaise, weakness elsewhere, back pain, paresthesias. Denies CP, SOB, DOE, treatment intolerance or new symptoms. Due for labs  AWV was 04/14/18 w/ Manuela Schwartz  DM:  -complications: neuropathy, microvascular disease -meds: metformin 1061m bid,glipizide,asa, acei, gabapentin -taking gabapentin bid - reports rxd for a long time for her neuropathy  HLD/Obesity:  -meds: pravastatin   HTN/LE edema:  -meds: lisinopril, lasix   GERD:  -meds:omeprazole   Hx Anemia: -chronic, intermittent -last colon 10/2013 with small benign cecal ulcers, several polyps - repeat colon advised in 5 yers -b12 a little low in the past, taking b12, ferritin ok 2019 -resolved 03/2018   ROS: See pertinent positives and negatives per HPI.  Past Medical History:  Diagnosis Date  . Benign paroxysmal positional vertigo 06/25/2013   dx with prior PCP, s/p labs, mri and vestibular rehab; nasal spray for etd seemed to help  . Blood in stool 03/11/2012  . Dependent edema    Bilateral  . Depression   . GERD (gastroesophageal reflux disease)   . Hyperlipidemia   . Low back pain    sciatica; sees orthopedic specialist ([facility])for this and shoulder tendinitis  . Obesity, diabetes, and hypertension syndrome ([place]   . Osteoarthritis, knee    Bilateral, s/p 3 total knee replacement surgerues on right, last  2005. Dr. BTonita Cong . Pellegrini-Steida syndrome    Myositis ossificans  . Seasonal allergies   . Shingles Jul 10 2012   LESIONS CLEARED BUT NOT TOLERATING ANY THING TIGHT AGAINST BODY--SHINGLES WERE AROSS BACK AND ABDOMEN    Past Surgical History:  Procedure Laterality Date  . ABDOMINAL HYSTERECTOMY  1984  . COLONOSCOPY    . left shoulder steroid injection    . TONSILLECTOMY  1977  . TOTAL HIP ARTHROPLASTY Right 10/10/2012   Procedure: R TOTAL HIP ARTHROPLASTY ANTERIOR APPROACH;  Surgeon: CMcarthur Rossetti MD;  Location: [facility];  Service: Orthopedics;  Laterality: Right;  Right Total Hip Arthroplasty, Anterior Approach (C-Arm)  . TOTAL KNEE ARTHROPLASTY Right    with multiple revisions, 2003, 2005  . TOTAL KNEE ARTHROPLASTY Left 01/09/2013   Procedure: LEFT TOTAL KNEE ARTHROPLASTY;  Surgeon: CMcarthur Rossetti MD;  Location: [facility];  Service: Orthopedics;  Laterality: Left;  . TUBAL LIGATION  1978  . WISDOM TOOTH EXTRACTION  2013,2014    Family History  Problem Relation Age of Onset  . Diabetes Mother   . Hypertension Mother   . Hypertension Father   . Cancer Father        Lung CA  . Coronary artery disease Father   . Colon cancer Neg Hx     SOCIAL HX: se ehpi   Current Outpatient Medications:  .  aspirin 81 MG tablet, Take 81 mg by mouth daily., Disp: , Rfl:  .  Blood Glucose Monitoring Suppl (ONETOUCH VERIO IQ SYSTEM) W/DEVICE KIT, by Does not apply route., Disp: , Rfl:  .  cetirizine (ZYRTEC)  10 MG tablet, Take 10 mg by mouth at bedtime., Disp: , Rfl:  .  fluticasone (FLONASE) 50 MCG/ACT nasal spray, Place 1 spray into both nostrils daily., Disp: 16 g, Rfl: 3 .  furosemide (LASIX) 40 MG tablet, TAKE 1/2 TABLET(20 MG) BY MOUTH DAILY, Disp: 30 tablet, Rfl: 5 .  gabapentin (NEURONTIN) 300 MG capsule, TAKE 1 CAPSULE(300 MG) BY MOUTH TWICE DAILY, Disp: 180 capsule, Rfl: 2 .  glucose blood (ONETOUCH VERIO) test strip, USE AS DIRECTED TO TEST BLOOD SUGAR ONCE DAILY,  Disp: 100 each, Rfl: 3 .  glucose blood test strip, Use as directed to check blood sugar once a day, Disp: 100 each, Rfl: 3 .  lisinopril (PRINIVIL,ZESTRIL) 20 MG tablet, TAKE 1 TABLET BY MOUTH EVERY DAY, Disp: 90 tablet, Rfl: 1 .  metFORMIN (GLUCOPHAGE) 1000 MG tablet, TAKE 1 TABLET BY MOUTH TWICE DAILY WITH A MEAL, Disp: 180 tablet, Rfl: 1 .  omeprazole (PRILOSEC) 10 MG capsule, Take 10 mg by mouth daily as needed., Disp: , Rfl:  .  pravastatin (PRAVACHOL) 40 MG tablet, TAKE 1 AND 1/2 TABLETS(60 MG) BY MOUTH DAILY, Disp: 135 tablet, Rfl: 1  EXAM:  Vitals:   08/14/18 1025  BP: 116/88  Pulse: 77  Temp: 98.2 F (36.8 C)    Body mass index is 29.04 kg/m.  GENERAL: vitals reviewed and listed above, alert, oriented, appears well hydrated and in no acute distress  HEENT: atraumatic, conjunttiva clear, no obvious abnormalities on inspection of external nose and ears  NECK: no obvious masses on inspection  LUNGS: clear to auscultation bilaterally, no wheezes, rales or rhonchi, good air movement  CV: HRRR, no peripheral edema  MS: moves all extremities without noticeable abnormality  PSYCH: pleasant and cooperative, no obvious depression or anxiety  ASSESSMENT AND PLAN:  Discussed the following assessment and plan:  Type 2 diabetes mellitus with diabetic neuropathy, without long-term current use of insulin (HCC) - Plan: Hemoglobin A1c  Hypertension associated with diabetes ([place]) - Plan: Basic metabolic panel  Hyperlipidemia associated with type 2 diabetes mellitus ([place])  B12 deficiency - Plan: Vitamin B12  -labs per orders -reviewed dosing for b12 and vit D3 per her request -follow up ortho regarding legs as planned, here if not getting better or not getting answers and advised would advise neurology eval -lifestyle recs -follow up 3-4 months -Patient advised to return or notify a doctor immediately if symptoms worsen or persist or new concerns arise.  Patient  Instructions  BEFORE YOU LEAVE: -labs -follow up: 3-4 months  Follow up with your specialist about the leg issues. Let us know if you needs [place] to assist in evaluation of this.  We have ordered labs or studies at this visit. It can take up to 1-2 weeks for results and processing. IF results require follow up or explanation, we will call you with instructions. Clinically stable results will be released to your [facility]. If you have not heard from [place] or cannot find your results in [facility] in 2 weeks please contact our office at [phone].  If you are not yet signed up for [facility], please consider signing up.   We recommend the following healthy lifestyle for LIFE: 1) Small portions. But, make sure to get regular (at least 3 per day), healthy meals and small healthy snacks if needed.  2) Eat a healthy clean diet.   TRY TO EAT: -at least 5-7 servings of low sugar, colorful, and nutrient rich vegetables per day (not corn, potatoes or bananas.) -  berries are the best choice if you wish to eat fruit (only eat small amounts if trying to reduce weight)  -lean meets (fish, white meat of chicken or [place]) -vegan proteins for some meals - beans or tofu, whole grains, nuts and seeds -Replace bad fats with good fats - good fats include: fish, nuts and seeds, canola oil, olive oil -small amounts of low fat or non fat dairy -small amounts of100 % whole grains - check the lables -drink plenty of water  AVOID: -SUGAR, sweets, anything with added sugar, corn syrup or sweeteners - must read labels as even foods advertised as "healthy" often are loaded with sugar -if you must have a sweetener, small amounts of stevia may be best -sweetened beverages and artificially sweetened beverages -simple starches (rice, bread, potatoes, pasta, chips, etc - small amounts of 100% whole grains are ok) -red meat, pork, butter -fried foods, fast food, processed food, excessive dairy, eggs and coconut.  3)Get at least  150 minutes of sweaty aerobic exercise per week.  4)Reduce stress - consider counseling, meditation and relaxation to balance other aspects of your life.         Lucretia Kern, DO

## 2018-08-14 ENCOUNTER — Other Ambulatory Visit: Payer: PPO

## 2018-08-14 ENCOUNTER — Encounter: Payer: Self-pay | Admitting: Family Medicine

## 2018-08-14 ENCOUNTER — Ambulatory Visit (INDEPENDENT_AMBULATORY_CARE_PROVIDER_SITE_OTHER): Payer: PPO | Admitting: Family Medicine

## 2018-08-14 VITALS — BP 116/88 | HR 77 | Temp 98.2°F | Ht 66.0 in | Wt 179.9 lb

## 2018-08-14 DIAGNOSIS — E538 Deficiency of other specified B group vitamins: Secondary | ICD-10-CM

## 2018-08-14 DIAGNOSIS — I10 Essential (primary) hypertension: Secondary | ICD-10-CM

## 2018-08-14 DIAGNOSIS — E1159 Type 2 diabetes mellitus with other circulatory complications: Secondary | ICD-10-CM | POA: Diagnosis not present

## 2018-08-14 DIAGNOSIS — E785 Hyperlipidemia, unspecified: Secondary | ICD-10-CM

## 2018-08-14 DIAGNOSIS — E1140 Type 2 diabetes mellitus with diabetic neuropathy, unspecified: Secondary | ICD-10-CM | POA: Diagnosis not present

## 2018-08-14 DIAGNOSIS — E1169 Type 2 diabetes mellitus with other specified complication: Secondary | ICD-10-CM | POA: Diagnosis not present

## 2018-08-14 LAB — BASIC METABOLIC PANEL
BUN: 11 mg/dL (ref 6–23)
CO2: 30 mEq/L (ref 19–32)
Calcium: 9.7 mg/dL (ref 8.4–10.5)
Chloride: 102 mEq/L (ref 96–112)
Creatinine, Ser: 0.52 mg/dL (ref 0.40–1.20)
GFR: 150.62 mL/min (ref >60.00)
Glucose, Bld: 135 mg/dL — ABNORMAL HIGH (ref 70–99)
Potassium: 4.2 mEq/L (ref 3.5–5.1)
Sodium: 140 mEq/L (ref 135–145)

## 2018-08-14 LAB — VITAMIN B12

## 2018-08-14 NOTE — Patient Instructions (Signed)
BEFORE YOU LEAVE: -labs -follow up: 3-4 months  Follow up with your specialist about the leg issues. Let us know if you needs [place] to assist in evaluation of this.  We have ordered labs or studies at this visit. It can take up to 1-2 weeks for results and processing. IF results require follow up or explanation, we will call you with instructions. Clinically stable results will be released to your [facility]. If you have not heard from [place] or cannot find your results in [facility] in 2 weeks please contact our office at [phone].  If you are not yet signed up for [facility], please consider signing up.   We recommend the following healthy lifestyle for LIFE: 1) Small portions. But, make sure to get regular (at least 3 per day), healthy meals and small healthy snacks if needed.  2) Eat a healthy clean diet.   TRY TO EAT: -at least 5-7 servings of low sugar, colorful, and nutrient rich vegetables per day (not corn, potatoes or bananas.) -berries are the best choice if you wish to eat fruit (only eat small amounts if trying to reduce weight)  -lean meets (fish, white meat of chicken or [place]) -vegan proteins for some meals - beans or tofu, whole grains, nuts and seeds -Replace bad fats with good fats - good fats include: fish, nuts and seeds, canola oil, olive oil -small amounts of low fat or non fat dairy -small amounts of100 % whole grains - check the lables -drink plenty of water  AVOID: -SUGAR, sweets, anything with added sugar, corn syrup or sweeteners - must read labels as even foods advertised as "healthy" often are loaded with sugar -if you must have a sweetener, small amounts of stevia may be best -sweetened beverages and artificially sweetened beverages -simple starches (rice, bread, potatoes, pasta, chips, etc - small amounts of 100% whole grains are ok) -red meat, pork, butter -fried foods, fast food, processed food, excessive dairy, eggs and coconut.  3)Get at least 150 minutes of  sweaty aerobic exercise per week.  4)Reduce stress - consider counseling, meditation and relaxation to balance other aspects of your life.

## 2018-08-15 LAB — HEMOGLOBIN A1C
Hgb A1c MFr Bld: 6.4 % of total Hgb — ABNORMAL HIGH (ref <5.7)
Mean Plasma Glucose: 137 (calc)
eAG (mmol/L): 7.6 (calc)

## 2018-08-22 ENCOUNTER — Telehealth: Payer: Self-pay | Admitting: Family Medicine

## 2018-08-22 NOTE — Telephone Encounter (Signed)
Copied from [place] [id number]. Topic: Referral - Question >> Aug 22, 2018  2:53 PM Conception Chancy, NT wrote: Reason for CRM: patient is calling and states she has been struggling with acute sinus infections and was told by Dr. Maudie Mercury if she did not improve she would place a referral to a specialist. Patient states it has not Improved she is still having sinus pressure in nose and face.

## 2018-08-25 NOTE — Telephone Encounter (Signed)
I called the pt and informed her of the message below. Phone numbers for both ENT offices were given to the pt to call for an appt.

## 2018-08-25 NOTE — Telephone Encounter (Signed)
Please call pt and provide number for [facility] and Dr. Pollie Friar office she can call. If can't get prompt appt she can come here in the interim, particularly if any severe symptoms.  I hope she feels better soon!

## 2018-08-27 HISTORY — PX: POLYPECTOMY: SHX149

## 2018-08-27 HISTORY — PX: COLONOSCOPY: SHX174

## 2018-09-03 DIAGNOSIS — J320 Chronic maxillary sinusitis: Secondary | ICD-10-CM | POA: Diagnosis not present

## 2018-09-03 DIAGNOSIS — J329 Chronic sinusitis, unspecified: Secondary | ICD-10-CM | POA: Insufficient documentation

## 2018-09-29 ENCOUNTER — Ambulatory Visit (INDEPENDENT_AMBULATORY_CARE_PROVIDER_SITE_OTHER): Payer: PPO | Admitting: Orthopaedic Surgery

## 2018-09-29 ENCOUNTER — Encounter (INDEPENDENT_AMBULATORY_CARE_PROVIDER_SITE_OTHER): Payer: Self-pay | Admitting: Orthopaedic Surgery

## 2018-09-29 ENCOUNTER — Ambulatory Visit (INDEPENDENT_AMBULATORY_CARE_PROVIDER_SITE_OTHER): Payer: PPO

## 2018-09-29 DIAGNOSIS — M25512 Pain in left shoulder: Secondary | ICD-10-CM

## 2018-09-29 DIAGNOSIS — E119 Type 2 diabetes mellitus without complications: Secondary | ICD-10-CM

## 2018-09-29 MED ORDER — LIDOCAINE HCL 1 % IJ SOLN
3.0000 mL | INTRAMUSCULAR | Status: AC | PRN
  Administered 2018-09-29: 3 mL

## 2018-09-29 MED ORDER — METHYLPREDNISOLONE ACETATE 40 MG/ML IJ SUSP
40.0000 mg | INTRAMUSCULAR | Status: AC | PRN
  Administered 2018-09-29: 40 mg via INTRA_ARTICULAR

## 2018-09-29 NOTE — Progress Notes (Signed)
Office Visit Note   Patient: Renee Watts           Date of Birth: 10/18/1950           MRN: [id number] Visit Date: 09/29/2018              Requested by: Renee Kern, DO [address] [place], [place] [address] PCP: Renee Kern, DO   Assessment & Plan: Visit Diagnoses:  1. Left shoulder pain, unspecified chronicity     Plan:  She is given handouts on pendulum, wall crawls and abduction exercises.  See her back in 2 weeks to check her response to cortisone injection.  She is to watch her glucose levels closely over the next few days.  Questions were encouraged and answered at length. Follow-Up Instructions: Return in about 2 weeks (around 10/13/2018).   Orders:  Orders Placed This Encounter  Procedures  . Large Joint Inj  . XR Shoulder Left   No orders of the defined types were placed in this encounter.     Procedures: Large Joint Inj: L subacromial bursa on 09/29/2018 11:33 AM Indications: pain Details: 22 G 1.5 in needle, lateral approach  Arthrogram: No  Medications: 3 mL lidocaine 1 %; 40 mg methylPREDNISolone acetate 40 MG/ML Outcome: tolerated well, no immediate complications Procedure, treatment alternatives, risks and benefits explained, specific risks discussed. Consent was given by the patient. Immediately prior to procedure a time out was called to verify the correct patient, procedure, equipment, support staff and site/side marked as required. Patient was prepped and draped in the usual sterile fashion.       Clinical Data: No additional findings.   Subjective: Chief Complaint  Patient presents with  . Left Arm - Pain    HPI Renee Watts 68 year old female comes in today with new complaint of left shoulder pain.  Pain began after Christmas.  No known injury.  Denies any neck pain.  Denies any numbness tingling down the left arm.  Pain does radiate down to the elbow.  She is having no difficulty getting dressed or any ADL's.  She has  been taking Tylenol for the pain without any real relief.  Review of Systems Please see HPI otherwise negative  Objective: Vital Signs: There were no vitals taken for this visit.  Physical Exam Constitutional:      Appearance: She is not ill-appearing or diaphoretic.  Pulmonary:     Effort: Pulmonary effort is normal.  Neurological:     Mental Status: She is alert and oriented to person, place, and time.     Ortho Exam Weakness with external rotation left shoulder against resistance.  Internal rotation bilaterally 5 out of 5.  Positive impingement on the left negative on the right.  Empty can test negative bilaterally.  Liftoff test positive on the left negative on the right.  Good range of motion bilateral elbows without pain.  Radial pulses are intact bilaterally Specialty Comments:  No specialty comments available.  Imaging: Xr Shoulder Left  Result Date: 09/29/2018 Left shoulder 3 views: No acute fracture.  Subacromial space well maintained.  Shoulders well located.  AC joint is well-maintained.  Slight narrowing of the glenohumeral joint on the axillary view.    PMFS History: Patient Active Problem List   Diagnosis Date Noted  . Obesity 01/06/2016  . Low back pain 01/06/2016  . Diabetic peripheral neuropathy ([place]) 09/01/2012  . Hyperlipemia 09/16/2006  . Essential hypertension 09/16/2006  . GERD 09/16/2006  . Type  II diabetes mellitus with neurological manifestations ([place]) 06/17/2006  . Osteoarthritis 06/17/2006  . DEPENDENT EDEMA, LEGS, BILATERAL 06/17/2006   Past Medical History:  Diagnosis Date  . Benign paroxysmal positional vertigo 06/25/2013   dx with prior PCP, s/p labs, mri and vestibular rehab; nasal spray for etd seemed to help  . Blood in stool 03/11/2012  . Dependent edema    Bilateral  . Depression   . GERD (gastroesophageal reflux disease)   . Hyperlipidemia   . Low back pain    sciatica; sees orthopedic specialist ([facility])for this and  shoulder tendinitis  . Obesity, diabetes, and hypertension syndrome ([place])   . Osteoarthritis, knee    Bilateral, s/p 3 total knee replacement surgerues on right, last 2005. Dr. Tonita Cong  . Pellegrini-Steida syndrome    Myositis ossificans  . Seasonal allergies   . Shingles Jul 10 2012   LESIONS CLEARED BUT NOT TOLERATING ANY THING TIGHT AGAINST BODY--SHINGLES WERE AROSS BACK AND ABDOMEN    Family History  Problem Relation Age of Onset  . Diabetes Mother   . Hypertension Mother   . Hypertension Father   . Cancer Father        Lung CA  . Coronary artery disease Father   . Colon cancer Neg Hx     Past Surgical History:  Procedure Laterality Date  . ABDOMINAL HYSTERECTOMY  1984  . COLONOSCOPY    . left shoulder steroid injection    . TONSILLECTOMY  1977  . TOTAL HIP ARTHROPLASTY Right 10/10/2012   Procedure: R TOTAL HIP ARTHROPLASTY ANTERIOR APPROACH;  Surgeon: Mcarthur Rossetti, MD;  Location: [facility];  Service: Orthopedics;  Laterality: Right;  Right Total Hip Arthroplasty, Anterior Approach (C-Arm)  . TOTAL KNEE ARTHROPLASTY Right    with multiple revisions, 2003, 2005  . TOTAL KNEE ARTHROPLASTY Left 01/09/2013   Procedure: LEFT TOTAL KNEE ARTHROPLASTY;  Surgeon: Mcarthur Rossetti, MD;  Location: [facility];  Service: Orthopedics;  Laterality: Left;  . TUBAL LIGATION  1978  . WISDOM TOOTH EXTRACTION  2013,2014   Social History   Occupational History    Employer: [employer]  Tobacco Use  . Smoking status: Never Smoker  . Smokeless tobacco: Never Used  Substance and Sexual Activity  . Alcohol use: No  . Drug use: No  . Sexual activity: Not on file

## 2018-10-11 ENCOUNTER — Other Ambulatory Visit: Payer: Self-pay | Admitting: Family Medicine

## 2018-11-13 ENCOUNTER — Ambulatory Visit: Payer: PPO | Admitting: Family Medicine

## 2018-11-14 ENCOUNTER — Encounter: Payer: Self-pay | Admitting: Gastroenterology

## 2018-11-19 NOTE — Progress Notes (Signed)
Virtual Visit via Telephone Note  I connected with Illa Level on 11/20/18 at 11:30 AM EDT by telephone and verified that I am speaking with the correct person using two identifiers.   I discussed the limitations, risks, security and privacy concerns of performing an evaluation and management service by telephone and the availability of in person appointments. I also discussed with the patient that there may be a patient responsible charge related to this service. The patient expressed understanding and agreed to proceed.  Location patient: home Location provider: work or home office Participants present for the call: patient, provider Patient did not have a visit in the prior 7 days to address this/these issue(s).   History of Present Illness:  Reports doing well. Walking 10-15 minutes per day and doing some home rehab exercises. Diabetes well controlled on recent labs. No new concerns.  AWV was 04/14/18 w/ Manuela Schwartz due for repeat colonoscopy - currently delayed due to [place] situation.  DM:  -BS usually fasting 57-846 -complications: neuropathy, microvascular disease -meds: metformin 1000mg  bid, acei, gabapentin  -glipizide made sugars too low -taking gabapentin bid - reports rxd for a long time for her neuropathy -no low blood sugars -taking asa "because of age" - no other indication and denies heart dz, bloot clot, stroke history  HLD/Obesity:  -meds: pravastatin   HTN/LE edema:  -meds: lisinopril, lasix  -no CP, SOB, swelling  GERD:  -meds:omeprazole   Hx Anemia: -chronic, intermittent -last colon 10/2013 with small benign cecal ulcers, several polyps - repeat colon advised in 5 yers -b12 a little low in the past, taking b12, ferritin ok 2019 -resolved 03/2018  Observations/Objective: Patient sounds cheerful and well on the phone. I do not appreciate any SOB. Speech and thought processing are grossly intact. Patient reported vitals: wt is  177  Assessment and Plan:  Type II diabetes mellitus with neurological manifestations ([place])  Diabetic peripheral neuropathy (HCC)  Hypertension associated with diabetes ([place])  Hyperlipidemia associated with type 2 diabetes mellitus ([place])  Refills for glu monitor and lasix Lifestyle recommendations Social distancing advised and discussed Discussed risks/benefits asa and opted to stop taking She opted against in house lab check for now Advised to contact her gastroenterologist in regards to her colonoscopy - may be postponed for now given current situation with covid 19 I did not refer this patient for an OV in the next 24 hours for this/these issue(s). Follow up 3 month  I discussed the assessment and treatment plan with the patient. The patient was provided an opportunity to ask questions and all were answered. The patient agreed with the plan and demonstrated an understanding of the instructions.   Follow Up Instructions:  Advised assistant Wendie Simmer to help patient arrange the following: -send refills for strips/lancets for onetouch verio glu monitor -refill lasix  -TOC visit virtual with Dr. Ethlyn Gallery in 3 months  I provided 13 minutes of non-face-to-face time during this encounter.   Lucretia Kern, DO

## 2018-11-20 ENCOUNTER — Other Ambulatory Visit: Payer: Self-pay

## 2018-11-20 ENCOUNTER — Ambulatory Visit (INDEPENDENT_AMBULATORY_CARE_PROVIDER_SITE_OTHER): Payer: PPO | Admitting: Family Medicine

## 2018-11-20 DIAGNOSIS — I10 Essential (primary) hypertension: Secondary | ICD-10-CM | POA: Diagnosis not present

## 2018-11-20 DIAGNOSIS — E1142 Type 2 diabetes mellitus with diabetic polyneuropathy: Secondary | ICD-10-CM | POA: Diagnosis not present

## 2018-11-20 DIAGNOSIS — E1149 Type 2 diabetes mellitus with other diabetic neurological complication: Secondary | ICD-10-CM | POA: Diagnosis not present

## 2018-11-20 DIAGNOSIS — E785 Hyperlipidemia, unspecified: Secondary | ICD-10-CM | POA: Diagnosis not present

## 2018-11-20 DIAGNOSIS — I152 Hypertension secondary to endocrine disorders: Secondary | ICD-10-CM

## 2018-11-20 DIAGNOSIS — E1169 Type 2 diabetes mellitus with other specified complication: Secondary | ICD-10-CM

## 2018-11-20 DIAGNOSIS — E1159 Type 2 diabetes mellitus with other circulatory complications: Secondary | ICD-10-CM

## 2018-11-20 MED ORDER — FUROSEMIDE 40 MG PO TABS: ORAL_TABLET | ORAL | 5 refills | Status: DC

## 2018-11-20 MED ORDER — GLUCOSE BLOOD VI STRP: ORAL_STRIP | 5 refills | Status: DC

## 2018-11-20 MED ORDER — GLUCOSE BLOOD VI STRP: ORAL_STRIP | 3 refills | Status: DC

## 2018-11-20 NOTE — Addendum Note (Signed)
Addended by: Agnes Lawrence on: 11/20/2018 03:58 PM   Modules accepted: Orders

## 2019-01-15 ENCOUNTER — Other Ambulatory Visit: Payer: Self-pay | Admitting: *Deleted

## 2019-01-15 MED ORDER — GABAPENTIN 300 MG PO CAPS: ORAL_CAPSULE | ORAL | 0 refills | Status: DC

## 2019-01-15 NOTE — Telephone Encounter (Signed)
Rx done. 

## 2019-01-23 ENCOUNTER — Encounter: Payer: Self-pay | Admitting: Gastroenterology

## 2019-01-23 ENCOUNTER — Encounter: Payer: Self-pay | Admitting: Family Medicine

## 2019-01-23 ENCOUNTER — Other Ambulatory Visit: Payer: Self-pay

## 2019-01-23 ENCOUNTER — Ambulatory Visit (INDEPENDENT_AMBULATORY_CARE_PROVIDER_SITE_OTHER): Payer: PPO | Admitting: Family Medicine

## 2019-01-23 DIAGNOSIS — R29898 Other symptoms and signs involving the musculoskeletal system: Secondary | ICD-10-CM | POA: Diagnosis not present

## 2019-01-23 DIAGNOSIS — K219 Gastro-esophageal reflux disease without esophagitis: Secondary | ICD-10-CM

## 2019-01-23 DIAGNOSIS — E785 Hyperlipidemia, unspecified: Secondary | ICD-10-CM

## 2019-01-23 DIAGNOSIS — E1149 Type 2 diabetes mellitus with other diabetic neurological complication: Secondary | ICD-10-CM

## 2019-01-23 DIAGNOSIS — I10 Essential (primary) hypertension: Secondary | ICD-10-CM

## 2019-01-23 DIAGNOSIS — R600 Localized edema: Secondary | ICD-10-CM

## 2019-01-23 NOTE — Progress Notes (Signed)
Virtual Visit via Telephone Note  I connected with Renee Watts on 01/23/19 at 11:00 AM EDT by telephone and verified that I am speaking with the correct person using two identifiers.   I discussed the limitations, risks, security and privacy concerns of performing an evaluation and management service by telephone and the availability of in person appointments. I also discussed with the patient that there may be a patient responsible charge related to this service. The patient expressed understanding and agreed to proceed.  Location patient: home Location provider: work or home office Participants present for the call: patient, provider Patient did not have a visit in the prior 7 days to address this/these issue(s).   History of Present Illness: Pt seen for follow up on chronic conditions and TOC, previously seen by Dr. Maudie Mercury.  HTN:  Taking Lisinopril 20 mg more for DM/renal protection than bp issues.  GERD: only if eat tomato and chocolate.  Takes omeprazole prn.   DM II: checking fsbs 2-3 x/wk.  In am fsbs 85-125.   Has diabetic neuropathy, take gabapentin.  Legs "act up" if eats more sweets.  No longer taking ASA 81 mg.  Eating baked/grilled foods, greens.   LE edema: takes lasix when needed for swelling in legs.  Happens when eats more salt. Endorses standing and sitting a lot throughout the day.  HLD: pravastatin 40 mg daily.   H/o OA b/l knees s/p TKR.  Started having issues with weakness.  Started doing exercises from PT.  Followed by Ortho, Dr. Ninfa Linden.  Had issues with L shoulder.  Dx'd with tendonitis by Ortho, Dr. Ninfa Linden.   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  Plans to start walking more as she is now able to take the kids outside.   Observations/Objective: Patient sounds cheerful and well on the phone. I do not appreciate any SOB. Speech and thought processing are grossly intact. Patient reported vitals:  Assessment and Plan: Essential  hypertension -continue lisinopril 20 mg daily -discussed lifestyle modifications  Gastroesophageal reflux disease, esophagitis presence not specified -continue omeprazole -avoid foods known to cause problems.  Type II diabetes mellitus with neurological manifestations ([place]) -controlled -hgb A1C 6.4% 08/14/18 -continue Metformin 1000 mg BID.  On lisinopril 20 mg daily -continue lifestyle modifications  Hyperlipidemia, unspecified hyperlipidemia type -continue pravastatin -continue lifestyle modifications  Weakness of both lower extremities -s/p b/l TKRs -continue exercises from PT -continue gabapentin  -continue f/u with Dr. Ninfa Linden, Ortho  Bilateral lower extremity edema -stable -continue lasix prn -decrease sodium intake and consider compression socks/TED hose   Follow Up Instructions: F/u prn in the next 3-4 months  I did not refer this patient for an OV in the next 24 hours for this/these issue(s).  I discussed the assessment and treatment plan with the patient. The patient was provided an opportunity to ask questions and all were answered. The patient agreed with the plan and demonstrated an understanding of the instructions.   The patient was advised to call back or seek an in-person evaluation if the symptoms worsen or if the condition fails to improve as anticipated.  I provided 11 minutes of non-face-to-face time during this encounter.   Billie Ruddy, MD

## 2019-02-05 ENCOUNTER — Ambulatory Visit: Payer: PPO | Admitting: *Deleted

## 2019-02-05 ENCOUNTER — Other Ambulatory Visit: Payer: Self-pay

## 2019-02-05 VITALS — Ht 66.0 in | Wt 178.0 lb

## 2019-02-05 DIAGNOSIS — Z86010 Personal history of colonic polyps: Secondary | ICD-10-CM

## 2019-02-05 MED ORDER — PEG 3350-KCL-NA BICARB-NACL 420 G PO SOLR: 4000.0000 mL | Freq: Once | ORAL | 0 refills | Status: AC

## 2019-02-05 NOTE — Progress Notes (Signed)
No egg or soy allergy known to patient  No issues with past sedation with any surgeries  or procedures, no intubation problems  No diet pills per patient No home 02 use per patient  No blood thinners per patient  Pt denies issues with constipation  No A fib or A flutter  EMMI video sent to pt's e mail    Pt verified name, DOB, address and insurance during PV today. Pt mailed instruction packet to included paper to complete and mail back to [facility] with addressed and stamped envelope, Emmi video, copy of consent form to read and not return, and instructions. PV completed over the phone. Pt encouraged to call with questions or issues   Pt is aware that care partner will wait in the car during parking lot; if they feel like they will be too hot to wait in the car; they may wait in the lobby.  We want them to wear a mask (we do not have any that we can provide them), practice social distancing, and we will check their temperatures when they get here.  I did remind patient that their care partner needs to stay in the parking lot the entire time. Pt will wear mask into building

## 2019-02-16 ENCOUNTER — Telehealth: Payer: Self-pay | Admitting: Gastroenterology

## 2019-02-16 NOTE — Telephone Encounter (Signed)

## 2019-02-17 ENCOUNTER — Encounter: Payer: Self-pay | Admitting: Gastroenterology

## 2019-02-17 ENCOUNTER — Other Ambulatory Visit: Payer: Self-pay

## 2019-02-17 ENCOUNTER — Ambulatory Visit (AMBULATORY_SURGERY_CENTER): Payer: PPO | Admitting: Gastroenterology

## 2019-02-17 VITALS — BP 123/69 | HR 78 | Temp 99.1°F | Resp 17 | Ht 66.0 in | Wt 179.0 lb

## 2019-02-17 DIAGNOSIS — E785 Hyperlipidemia, unspecified: Secondary | ICD-10-CM | POA: Diagnosis not present

## 2019-02-17 DIAGNOSIS — E119 Type 2 diabetes mellitus without complications: Secondary | ICD-10-CM | POA: Diagnosis not present

## 2019-02-17 DIAGNOSIS — D123 Benign neoplasm of transverse colon: Secondary | ICD-10-CM

## 2019-02-17 DIAGNOSIS — K219 Gastro-esophageal reflux disease without esophagitis: Secondary | ICD-10-CM | POA: Diagnosis not present

## 2019-02-17 DIAGNOSIS — Z86010 Personal history of colonic polyps: Secondary | ICD-10-CM | POA: Diagnosis not present

## 2019-02-17 DIAGNOSIS — D124 Benign neoplasm of descending colon: Secondary | ICD-10-CM

## 2019-02-17 DIAGNOSIS — I10 Essential (primary) hypertension: Secondary | ICD-10-CM | POA: Diagnosis not present

## 2019-02-17 MED ORDER — SODIUM CHLORIDE 0.9 % IV SOLN: 500.0000 mL | Freq: Once | INTRAVENOUS | Status: DC

## 2019-02-17 NOTE — Progress Notes (Signed)
Called to room to assist during endoscopic procedure.  Patient ID and intended procedure confirmed with present staff. Received instructions for my participation in the procedure from the performing physician.  

## 2019-02-17 NOTE — Op Note (Signed)
[facility] Patient Name: Renee Watts Procedure Date: 02/17/2019 3:55 PM MRN: [id number] Endoscopist: Ladene Artist , MD Age: 68 Referring MD:  Date of Birth: Aug 27, 1951 Gender: Female Account #: [id number] Procedure:                Colonoscopy Indications:              Surveillance: Personal history of adenomatous                            polyps on last colonoscopy 5 years ago Medicines:                Monitored Anesthesia Care Procedure:                Pre-Anesthesia Assessment:                           - Prior to the procedure, a History and Physical                            was performed, and patient medications and                            allergies were reviewed. The patient's tolerance of                            previous anesthesia was also reviewed. The risks                            and benefits of the procedure and the sedation                            options and risks were discussed with the patient.                            All questions were answered, and informed consent                            was obtained. Prior Anticoagulants: The patient has                            taken no previous anticoagulant or antiplatelet                            agents. ASA Grade Assessment: II - A patient with                            mild systemic disease. After reviewing the risks                            and benefits, the patient was deemed in                            satisfactory condition to undergo the procedure.  After obtaining informed consent, the colonoscope                            was passed under direct vision. Throughout the                            procedure, the patient's blood pressure, pulse, and                            oxygen saturations were monitored continuously. The                            Colonoscope was introduced through the anus and                            advanced to the the  cecum, identified by                            appendiceal orifice and ileocecal valve. The                            ileocecal valve, appendiceal orifice, and rectum                            were photographed. The quality of the bowel                            preparation was good. The colonoscopy was performed                            without difficulty. The patient tolerated the                            procedure well. Scope In: 4:08:14 PM Scope Out: 4:23:50 PM Scope Withdrawal Time: 0 hours 12 minutes 22 seconds  Total Procedure Duration: 0 hours 15 minutes 36 seconds  Findings:                 The perianal and digital rectal examinations were                            normal.                           Four sessile polyps were found in the descending                            colon (3) and transverse colon (1). The polyps were                            6 to 8 mm in size. These polyps were removed with a                            cold snare. Resection and retrieval were complete.  Anal papilla(e) were hypertrophied.                           External and internal hemorrhoids were found during                            retroflexion. The hemorrhoids were small and Grade                            I (internal hemorrhoids that do not prolapse).                           The exam was otherwise without abnormality on                            direct and retroflexion views. Complications:            No immediate complications. Estimated blood loss:                            None. Estimated Blood Loss:     Estimated blood loss: none. Impression:               - Four 6 to 8 mm polyps in the descending colon and                            in the transverse colon, removed with a cold snare.                            Resected and retrieved.                           - Anal papilla(e) were hypertrophied.                           - External and internal  hemorrhoids.                           - The examination was otherwise normal on direct                            and retroflexion views. Recommendation:           - Repeat colonoscopy date to be determined after                            pending pathology results are reviewed for                            surveillance.                           - Patient has a contact number available for                            emergencies. The signs and symptoms of potential  delayed complications were discussed with the                            patient. Return to normal activities tomorrow.                            Written discharge instructions were provided to the                            patient.                           - Resume previous diet.                           - Continue present medications.                           - Await pathology results. Ladene Artist, MD 02/17/2019 4:31:42 PM This report has been signed electronically.

## 2019-02-17 NOTE — Progress Notes (Signed)
Report to PACU, RN, vss, BBS= Clear.  

## 2019-02-17 NOTE — Patient Instructions (Addendum)
4 polyps removed today.  Internal and external hemorrhoids noted. Sent to lab for pathology Dr Fuller Plan will mail you a letter in 1-3 weeks with results Please call us if you do not receive a letter within 3 weeks and we will tell you the results over the phone.  [phone]   YOU HAD AN ENDOSCOPIC PROCEDURE TODAY AT [facility]:   Refer to the procedure report that was given to you for any specific questions about what was found during the examination.  If the procedure report does not answer your questions, please call your gastroenterologist to clarify.  If you requested that your care partner not be given the details of your procedure findings, then the procedure report has been included in a sealed envelope for you to review at your convenience later.  YOU SHOULD EXPECT: Some feelings of bloating in the abdomen. Passage of more gas than usual.  Walking can help get rid of the air that was put into your GI tract during the procedure and reduce the bloating. If you had a lower endoscopy (such as a colonoscopy or flexible sigmoidoscopy) you may notice spotting of blood in your stool or on the toilet paper. If you underwent a bowel prep for your procedure, you may not have a normal bowel movement for a few days.  Please Note:  You might notice some irritation and congestion in your nose or some drainage.  This is from the oxygen used during your procedure.  There is no need for concern and it should clear up in a day or so.  SYMPTOMS TO REPORT IMMEDIATELY:   Following lower endoscopy (colonoscopy or flexible sigmoidoscopy):  Excessive amounts of blood in the stool  Significant tenderness or worsening of abdominal pains  Swelling of the abdomen that is new, acute  Fever of 100F or higher   For urgent or emergent issues, a gastroenterologist can be reached at any hour by calling [phone].   DIET:  We do recommend a small meal at first, but then you may proceed to your  regular diet.  Drink plenty of fluids but you should avoid alcoholic beverages for 24 hours.  ACTIVITY:  You should plan to take it easy for the rest of today and you should NOT DRIVE or use heavy machinery until tomorrow (because of the sedation medicines used during the test).    FOLLOW UP: Our staff will call the number listed on your records 48-72 hours following your procedure to check on you and address any questions or concerns that you may have regarding the information given to you following your procedure. If we do not reach you, we will leave a message.  We will attempt to reach you two times.  During this call, we will ask if you have developed any symptoms of COVID 19. If you develop any symptoms (ie: fever, flu-like symptoms, shortness of breath, cough etc.) before then, please call [phone].  If you test positive for Covid 19 in the 2 weeks post procedure, please call and report this information to [place].    If any biopsies were taken you will be contacted by phone or by letter within the next 1-3 weeks.  Please call us at [phone] if you have not heard about the biopsies in 3 weeks.    SIGNATURES/CONFIDENTIALITY: You and/or your care partner have signed paperwork which will be entered into your electronic medical record.  These signatures attest to the fact that that the information above  on your After Visit Summary has been reviewed and is understood.  Full responsibility of the confidentiality of this discharge information lies with you and/or your care-partner.

## 2019-02-19 ENCOUNTER — Telehealth: Payer: Self-pay

## 2019-02-19 NOTE — Telephone Encounter (Signed)
  Follow up Call-  Call back number 02/17/2019  Post procedure Call Back phone  # [phone]  Permission to leave phone message Yes  Some recent data might be hidden     Patient questions:  Do you have a fever, pain , or abdominal swelling? Yes.   Pain Score  0 *  Have you tolerated food without any problems? Yes.    Have you been able to return to your normal activities? Yes.    Do you have any questions about your discharge instructions: Diet   No. Medications  No. Follow up visit  No.  Do you have questions or concerns about your Care? No.  Actions: * If pain score is 4 or above: No action needed, pain <4.  1. Have you developed a fever since your procedure? no  2.   Have you had an respiratory symptoms (SOB or cough) since your procedure? no  3.   Have you tested positive for COVID 19 since your procedure no  4.   Have you had any family members/close contacts diagnosed with the COVID 19 since your procedure?  no   If yes to any of these questions please route to Joylene John, RN and Alphonsa Gin, Therapist, sports.

## 2019-02-25 ENCOUNTER — Encounter: Payer: Self-pay | Admitting: Gastroenterology

## 2019-04-09 ENCOUNTER — Other Ambulatory Visit: Payer: Self-pay | Admitting: Family Medicine

## 2019-05-23 ENCOUNTER — Ambulatory Visit: Payer: PPO

## 2019-07-07 DIAGNOSIS — H524 Presbyopia: Secondary | ICD-10-CM | POA: Diagnosis not present

## 2019-07-07 DIAGNOSIS — H25093 Other age-related incipient cataract, bilateral: Secondary | ICD-10-CM | POA: Diagnosis not present

## 2019-07-07 DIAGNOSIS — Z7984 Long term (current) use of oral hypoglycemic drugs: Secondary | ICD-10-CM | POA: Diagnosis not present

## 2019-07-07 DIAGNOSIS — H52222 Regular astigmatism, left eye: Secondary | ICD-10-CM | POA: Diagnosis not present

## 2019-07-07 DIAGNOSIS — H5203 Hypermetropia, bilateral: Secondary | ICD-10-CM | POA: Diagnosis not present

## 2019-07-07 DIAGNOSIS — E119 Type 2 diabetes mellitus without complications: Secondary | ICD-10-CM | POA: Diagnosis not present

## 2019-07-07 DIAGNOSIS — I10 Essential (primary) hypertension: Secondary | ICD-10-CM | POA: Diagnosis not present

## 2019-07-07 LAB — HM DIABETES EYE EXAM

## 2019-07-17 ENCOUNTER — Other Ambulatory Visit: Payer: Self-pay

## 2019-07-20 ENCOUNTER — Encounter: Payer: Self-pay | Admitting: Family Medicine

## 2019-07-20 ENCOUNTER — Telehealth: Payer: Self-pay

## 2019-07-20 NOTE — Telephone Encounter (Signed)
Spoke with pt state that she will call her insurance to see if they will cover the immunizations, pt will notify the office when she talks to her insurance

## 2019-07-20 NOTE — Telephone Encounter (Signed)
Copied from [place] [id number]. Topic: General - Other >> May 14, 2019  4:35 PM Pauline Good wrote: Reason for CRM: pt want to have a Shingles/Flu/Pneumonia/Prevnav 13/Tetanus >> May 15, 2019  8:45 AM Cox, Melburn Hake, CMA wrote: Please advise on which vaccines pt needs.

## 2019-08-18 ENCOUNTER — Other Ambulatory Visit: Payer: Self-pay | Admitting: Family Medicine

## 2019-08-18 DIAGNOSIS — Z1231 Encounter for screening mammogram for malignant neoplasm of breast: Secondary | ICD-10-CM

## 2019-08-19 ENCOUNTER — Other Ambulatory Visit: Payer: Self-pay

## 2019-08-19 ENCOUNTER — Ambulatory Visit
Admission: RE | Admit: 2019-08-19 | Discharge: 2019-08-19 | Disposition: A | Discharge status: Home / Self Care | Payer: PPO | Source: Ambulatory Visit | Attending: Family Medicine | Admitting: Family Medicine

## 2019-08-19 DIAGNOSIS — Z1231 Encounter for screening mammogram for malignant neoplasm of breast: Secondary | ICD-10-CM

## 2019-08-24 ENCOUNTER — Telehealth (INDEPENDENT_AMBULATORY_CARE_PROVIDER_SITE_OTHER): Payer: PPO | Admitting: Family Medicine

## 2019-08-24 ENCOUNTER — Telehealth: Payer: Self-pay | Admitting: *Deleted

## 2019-08-24 DIAGNOSIS — E1142 Type 2 diabetes mellitus with diabetic polyneuropathy: Secondary | ICD-10-CM | POA: Diagnosis not present

## 2019-08-24 MED ORDER — GABAPENTIN 300 MG PO CAPS: ORAL_CAPSULE | ORAL | 3 refills | Status: DC

## 2019-08-24 NOTE — Progress Notes (Signed)
Virtual Visit via Telephone Note  I connected with Renee Watts on 08/24/19 at  1:30 PM EST by telephone and verified that I am speaking with the correct person using two identifiers.   I discussed the limitations, risks, security and privacy concerns of performing an evaluation and management service by telephone and the availability of in person appointments. I also discussed with the patient that there may be a patient responsible charge related to this service. The patient expressed understanding and agreed to proceed.  Location patient: home Location provider: work or home office Participants present for the call: patient, provider Patient did not have a visit in the prior 7 days to address this/these issue(s).   History of Present Illness: Pt is a 68 yo female with pmh sig for HTN, GERD, DM II with neuropathy, OA, h/o depression, h/o shingles.   Pt with increased neuropathy pain in b/l legs from knees down.  Pt notes pain increased within the last 6 wks.  Pain worse if stretches legs straight out at night when trying to lay down.  Pain can keep pt from sleeping.  Denies injury.  BS is "pretty good", never past 125 lbs checks it every so often.  Notes some edema in b/l ankles.  Pt doing more sitting. Will sit on a stool at the counter for several hours during the day.  Pt taking gabapentin 300 mg BID.   Observations/Objective: Patient sounds cheerful and well on the phone. I do not appreciate any SOB. Speech and thought processing are grossly intact. Patient reported vitals:  Assessment and Plan: Diabetic peripheral neuropathy ([place])  -Diabetes controlled. -continue current regimen: metformin 1000 mg BID. -continue lifestyle modifications -will increase gabapentin to 600 mg in evening.  Continue 300 mg in am. -Pt encouraged to avoid prolonged sitting or standing. -given precautions. - Plan: gabapentin (NEURONTIN) 300 MG capsule  Follow Up Instructions: F/u in 1 month, sooner  if needed.  I did not refer this patient for an OV in the next 24 hours for this/these issue(s).  I discussed the assessment and treatment plan with the patient. The patient was provided an opportunity to ask questions and all were answered. The patient agreed with the plan and demonstrated an understanding of the instructions.   The patient was advised to call back or seek an in-person evaluation if the symptoms worsen or if the condition fails to improve as anticipated.  I provided 7:34 minutes of non-face-to-face time during this encounter.   Billie Ruddy, MD

## 2019-08-24 NOTE — Telephone Encounter (Signed)
Copied from [place] [phone]. Topic: General - Other >> Aug 24, 2019 10:42 AM Celene Kras wrote: Reason for CRM: Pt called and is needing to have a virtual visit due to the neuropathy in her legs. Please advise.  Appointment scheduled with PCP for 1:30PM today.

## 2019-08-27 ENCOUNTER — Ambulatory Visit: Payer: PPO | Attending: Internal Medicine

## 2019-08-27 DIAGNOSIS — R238 Other skin changes: Secondary | ICD-10-CM

## 2019-08-27 DIAGNOSIS — U071 COVID-19: Secondary | ICD-10-CM | POA: Diagnosis not present

## 2019-08-29 LAB — NOVEL CORONAVIRUS, NAA: SARS-CoV-2, NAA: NOT DETECTED

## 2019-09-08 ENCOUNTER — Other Ambulatory Visit: Payer: Self-pay

## 2019-09-08 ENCOUNTER — Ambulatory Visit (INDEPENDENT_AMBULATORY_CARE_PROVIDER_SITE_OTHER): Payer: PPO

## 2019-09-08 ENCOUNTER — Encounter: Payer: Self-pay | Admitting: Orthopaedic Surgery

## 2019-09-08 ENCOUNTER — Ambulatory Visit (INDEPENDENT_AMBULATORY_CARE_PROVIDER_SITE_OTHER): Payer: PPO | Admitting: Orthopaedic Surgery

## 2019-09-08 DIAGNOSIS — M79605 Pain in left leg: Secondary | ICD-10-CM

## 2019-09-08 MED ORDER — TRAMADOL HCL 50 MG PO TABS: 50.0000 mg | ORAL_TABLET | Freq: Four times a day (QID) | ORAL | 1 refills | Status: DC | PRN

## 2019-09-08 MED ORDER — METHYLPREDNISOLONE 4 MG PO TABS: ORAL_TABLET | ORAL | 0 refills | Status: DC

## 2019-09-08 NOTE — Progress Notes (Signed)
Office Visit Note   Patient: Renee Watts           Date of Birth: 09-02-1950           MRN: [id number] Visit Date: 09/08/2019              Requested by: Billie Ruddy, MD [place],  [place] [id number] PCP: Billie Ruddy, MD   Assessment & Plan: Visit Diagnoses:  1. Pain in left leg     Plan: The symptoms that she describes seem to be consistent with a radicular type of pain.  Since her blood glucose is under such good control I would like to put her on 6 days of a steroid taper with her agreeing to watch her blood glucose levels significantly for the next few weeks.  I agree with her increase in the Neurontin.  We will try some tramadol for pain as well.  All question concerns were answered and addressed.  We will see her back in 2 weeks to see how she is doing overall.  Given the degree of spondylolisthesis at L3-L4 I would recommend a MRI of her lumbar spine if her symptoms persist.  Follow-Up Instructions: Return in about 2 weeks (around 09/22/2019).   Orders:  Orders Placed This Encounter  Procedures  . XR Knee 1-2 Views Left  . XR Lumbar Spine 2-3 Views   Meds ordered this encounter  Medications  . methylPREDNISolone (MEDROL) 4 MG tablet    Sig: Medrol dose pack. Take as instructed    Dispense:  21 tablet    Refill:  0  . traMADol (ULTRAM) 50 MG tablet    Sig: Take 1-2 tablets (50-100 mg total) by mouth every 6 (six) hours as needed.    Dispense:  40 tablet    Refill:  1      Procedures: No procedures performed   Clinical Data: No additional findings.   Subjective: Chief Complaint  Patient presents with  . Lower Back - Pain  . Left Knee - Pain  The patient is well-known to me.  She has a history of a left total knee arthroplasty that we did in 2014 as well as a right total hip arthroplasty.  She also has a remote history of a right total knee arthroplasty.  She is a diabetic but reports good control.  She has had some neuropathy  in her legs and her primary care physician recently increased her Neurontin.  She takes 1 300 mg tablet in the morning and 2 300 mg tablets at night.  She been having some pain in her back but she really reports pain down the left leg from just lateral to the knee joint above the knee down to her ankle.  She does have neuropathy in her legs for years now.  She is ambulate with a cane.  She said no other acute changes in her medical status.  This pain that is different for her is been going on for about 3 weeks.  HPI  Review of Systems She currently denies any headache, chest pain, shortness of breath, fever, chills, nausea, vomiting  Objective: Vital Signs: There were no vitals taken for this visit.  Physical Exam She is alert and oriented x3 and in no acute distress Ortho Exam Examination of her left knee shows this ligamentously stable.  Her extension is full and her flexion is to about 95 degrees but this is chronic and this is the same on the  other side.  She has negative straight leg raise to the right or left side.  She does have neuropathy in both her feet.  Her pain is along the lateral aspect of her thigh about the distal third down to her ankle.  She says that the pain and there is no pain to palpation in this area. Specialty Comments:  No specialty comments available.  Imaging: XR Knee 1-2 Views Left  Result Date: 09/08/2019 2 views of the left knee show a total knee arthroplasty with no evidence of loosening or complicating features.  There is no knee joint effusion.  XR Lumbar Spine 2-3 Views  Result Date: 09/08/2019 2 views of the lumbar spine show a grade 1 anterolisthesis of L3 on L4.    PMFS History: Patient Active Problem List   Diagnosis Date Noted  . Obesity 01/06/2016  . Low back pain 01/06/2016  . Diabetic peripheral neuropathy ([place]) 09/01/2012  . Hyperlipemia 09/16/2006  . Essential hypertension 09/16/2006  . GERD 09/16/2006  . Type II diabetes mellitus  with neurological manifestations ([place]) 06/17/2006  . Osteoarthritis 06/17/2006  . DEPENDENT EDEMA, LEGS, BILATERAL 06/17/2006   Past Medical History:  Diagnosis Date  . Allergy   . Benign paroxysmal positional vertigo 06/25/2013   dx with prior PCP, s/p labs, mri and vestibular rehab; nasal spray for etd seemed to help  . Blood in stool 03/11/2012  . Dependent edema    Bilateral  . Depression   . GERD (gastroesophageal reflux disease)   . Hyperlipidemia   . Low back pain    sciatica; sees orthopedic specialist ([facility])for this and shoulder tendinitis  . Obesity, diabetes, and hypertension syndrome ([place])   . Osteoarthritis, knee    Bilateral, s/p 3 total knee replacement surgerues on right, last 2005. Dr. Tonita Cong  . Pellegrini-Steida syndrome    Myositis ossificans  . Seasonal allergies   . Shingles Jul 10 2012   LESIONS CLEARED BUT NOT TOLERATING ANY THING TIGHT AGAINST BODY--SHINGLES WERE AROSS BACK AND ABDOMEN    Family History  Problem Relation Age of Onset  . Diabetes Mother   . Hypertension Mother   . Hypertension Father   . Cancer Father        Lung CA  . Coronary artery disease Father   . Colon cancer Neg Hx   . Colon polyps Neg Hx   . Esophageal cancer Neg Hx   . Rectal cancer Neg Hx   . Stomach cancer Neg Hx     Past Surgical History:  Procedure Laterality Date  . ABDOMINAL HYSTERECTOMY  1984  . BLADDER SURGERY  03/2017   bladder tac per pt  . COLONOSCOPY    . left shoulder steroid injection    . POLYPECTOMY    . TONSILLECTOMY  1977  . TOTAL HIP ARTHROPLASTY Right 10/10/2012   Procedure: R TOTAL HIP ARTHROPLASTY ANTERIOR APPROACH;  Surgeon: Mcarthur Rossetti, MD;  Location: [facility];  Service: Orthopedics;  Laterality: Right;  Right Total Hip Arthroplasty, Anterior Approach (C-Arm)  . TOTAL KNEE ARTHROPLASTY Right    with multiple revisions, 2003, 2005  . TOTAL KNEE ARTHROPLASTY Left 01/09/2013   Procedure: LEFT TOTAL KNEE ARTHROPLASTY;  Surgeon:  Mcarthur Rossetti, MD;  Location: [facility];  Service: Orthopedics;  Laterality: Left;  . TUBAL LIGATION  1978  . WISDOM TOOTH EXTRACTION  2013,2014   Social History   Occupational History    Employer: [employer]  Tobacco Use  . Smoking status: Never Smoker  .  Smokeless tobacco: Never Used  Substance and Sexual Activity  . Alcohol use: No  . Drug use: No  . Sexual activity: Not on file

## 2019-09-22 ENCOUNTER — Ambulatory Visit: Payer: PPO | Admitting: Orthopaedic Surgery

## 2019-09-30 ENCOUNTER — Other Ambulatory Visit: Payer: Self-pay

## 2019-09-30 ENCOUNTER — Ambulatory Visit (INDEPENDENT_AMBULATORY_CARE_PROVIDER_SITE_OTHER): Payer: PPO | Admitting: Family Medicine

## 2019-09-30 ENCOUNTER — Encounter: Payer: Self-pay | Admitting: Family Medicine

## 2019-09-30 VITALS — BP 126/74 | HR 72 | Temp 97.9°F | Wt 180.0 lb

## 2019-09-30 DIAGNOSIS — M79605 Pain in left leg: Secondary | ICD-10-CM | POA: Diagnosis not present

## 2019-09-30 DIAGNOSIS — H5310 Unspecified subjective visual disturbances: Secondary | ICD-10-CM

## 2019-09-30 DIAGNOSIS — E1149 Type 2 diabetes mellitus with other diabetic neurological complication: Secondary | ICD-10-CM | POA: Diagnosis not present

## 2019-09-30 DIAGNOSIS — I83893 Varicose veins of bilateral lower extremities with other complications: Secondary | ICD-10-CM

## 2019-09-30 NOTE — Patient Instructions (Signed)
Varicose Veins Varicose veins are veins that have become enlarged, bulged, and twisted. They most often appear in the legs. What are the causes? This condition is caused by damage to the valves in the vein. These valves help blood return to your heart. When they are damaged and they stop working properly, blood may flow backward and back up in the veins near the skin, causing the veins to get larger and appear twisted. The condition can result from any issue that causes blood to back up, like pregnancy, prolonged standing, or obesity. What increases the risk? This condition is more likely to develop in people who are:  On their feet a lot.  Pregnant.  Overweight. What are the signs or symptoms? Symptoms of this condition include:  Bulging, twisted, and bluish veins.  A feeling of heaviness. This may be worse at the end of the day.  Leg pain. This may be worse at the end of the day.  Swelling in the leg.  Changes in skin color over the veins. How is this diagnosed? This condition may be diagnosed based on your symptoms, a physical exam, and an ultrasound test. How is this treated? Treatment for this condition may involve:  Avoiding sitting or standing in one position for long periods of time.  Wearing compression stockings. These stockings help to prevent blood clots and reduce swelling in the legs.  Raising (elevating) the legs when resting.  Losing weight.  Exercising regularly. If you have persistent symptoms or want to improve the way your varicose veins look, you may choose to have a procedure to close the varicose veins off or to remove them. Treatments to close off the veins include:  Sclerotherapy. In this treatment, a solution is injected into a vein to close it off.  Laser treatment. In this treatment, the vein is heated with a laser to close it off.  Radiofrequency vein ablation. In this treatment, an electrical current produced by radio waves is used to close  off the vein. Treatments to remove the veins include:  Phlebectomy. In this treatment, the veins are removed through small incisions made over the veins.  Vein ligation and stripping. In this treatment, incisions are made over the veins. The veins are then removed after being tied (ligated) with stitches (sutures). Follow these instructions at home: Activity  Walk as much as possible. Walking increases blood flow. This helps blood return to the heart and takes pressure off your veins. It also increases your cardiovascular strength.  Follow your health care provider's instructions about exercising.  Do not stand or sit in one position for a long period of time.  Do not sit with your legs crossed.  Rest with your legs raised during the day. General instructions   Follow any diet instructions given to you by your health care provider.  Wear compression stockings as directed by your health care provider. Do not wear other kinds of tight clothing around your legs, pelvis, or waist.  Elevate your legs at night to above the level of your heart.  If you get a cut in the skin over the varicose vein and the vein bleeds: ? Lie down with your leg raised. ? Apply firm pressure to the cut with a clean cloth until the bleeding stops. ? Place a bandage (dressing) on the cut. Contact a health care provider if:  The skin around your varicose veins starts to break down.  You have pain, redness, tenderness, or hard swelling over a vein.  You   are uncomfortable because of pain.  You get a cut in the skin over a varicose vein and it will not stop bleeding. Summary  Varicose veins are veins that have become enlarged, bulged, and twisted. They most often appear in the legs.  This condition is caused by damage to the valves in the vein. These valves help blood return to your heart.  Treatment for this condition includes frequent movements, wearing compression stockings, losing weight, and  exercising regularly. In some cases, procedures are done to close off or remove the veins.  Treatment for this condition may include wearing compression stockings, elevating the legs, losing weight, and engaging in regular activity. In some cases, procedures are done to close off or remove the veins. This information is not intended to replace advice given to you by your health care provider. Make sure you discuss any questions you have with your health care provider. Document Revised: 10/09/2018 Document Reviewed: 09/05/2016 Elsevier Patient Education  2020 [place].  Nonsurgical Procedures for Varicose Veins Various nonsurgical procedures can be used to treat varicose veins. Varicose veins are swollen, twisted veins that are visible under the skin. They occur most often in the legs. These veins may appear blue and bulging. Varicose veins are caused by damage to the valves in veins. All veins have a valve that makes blood flow in only one direction. If a valve gets weak or damaged, blood can pool and cause varicose veins. You may need a procedure to treat your varicose veins if they are causing symptoms or complications, or if lifestyle changes have not helped. These procedures can reduce pain, aching, and the risk of bleeding and blood clots. They can also improve the way the affected area looks (cosmetic appearance). The three common nonsurgical procedures are:  Sclerotherapy. A chemical is injected to close off a vein.  Laser treatment. Light energy is applied to close off the vein.  Radiofrequency vein ablation. Electrical energy is used to produce heat that closes off the vein. Your health care provider will discuss the method that is best for you based on your condition. Tell a health care provider about:  Any allergies you have.  All medicines you are taking, including vitamins, herbs, eye drops, creams, and over-the-counter medicines.  Any problems you or family members have  had with anesthetic medicines.  Any blood disorders you have.  Any surgeries you have had.  Any medical conditions you have.  Whether you are pregnant or may be pregnant. What are the risks? Generally, this is a safe procedure. However, problems may occur, including:  Damage to nearby nerves, tissues, or veins.  Skin irritation, sores, or dark spots.  Numbness.  Clotting.  Infection.  Allergic reactions to medicines.  Scarring.  Leg swelling.  Need for additional treatments.  Bruising. What happens before the procedure?  Ask your health care provider about: ? Changing or stopping your regular medicines. This is especially important if you are taking diabetes medicines or blood thinners. ? Taking over-the-counter medicines, vitamins, herbs, and supplements. ? Taking medicines such as aspirin and ibuprofen. These medicines can thin your blood. Do not take these medicines unless your health care provider tells you to take them.  You may have an exam or testing. This can include a tests to: ? Check for clots and check blood flow using sound waves (Doppler ultrasound). ? Observe how blood flows through your veins by injecting a dye that outlines your veins on X-rays (angiogram). This test is used in  rare cases. What happens during the procedure? One of the following procedures will be performed: Sclerotherapy This procedure is often used for small to medium veins.  A chemical (sclerosant) that irritates the lining of the vein will be injected into the vein. This will cause the varicose vein to be closed off. Sclerosants in different amounts and strengths can be used, depending on the size and location of the vein.  All of the varicose vein sites will be injected. You may need more than one treatment because new varicose veins may develop, or more than one injection may be needed for each varicose vein.  Laser treatment There are two ways that lasers are used to treat  varicose veins:  Light energy from a laser may be directed onto the vein through the skin.  A needle may be used to pass a thin laser catheter into the vein to cause it to close. You may need more than one treatment if the vein re-opens. In some cases, laser treatment may be combined with sclerotherapy. Radiofrequency vein ablation   You will be given a medicine that numbs the area (local anesthetic).  A small incision will be made near the varicose vein.  A thin tube (catheter) will be threaded into your vein.  The tip of the catheter will deploy electrodes.  The electrodes will deliver electrical energy to produce heat that closes off the vein. What happens after the procedure?  A bandage (dressing) may be used to cover the injection site or incisions.  You may have to wear compression stockings. These stockings help to prevent blood clots and reduce swelling in your legs.  Return to your normal activities as told by your health care provider. Summary  Varicose veins are swollen, twisted veins that are visible under the skin. They occur most often in the legs.  Various procedures can be used to treat varicose veins. You may need a procedure to treat your varicose veins if they are causing symptoms or complications, or if lifestyle changes have not helped.  Your health care provider will discuss the method that is best for you based on your condition. This information is not intended to replace advice given to you by your health care provider. Make sure you discuss any questions you have with your health care provider. Document Revised: 12/05/2018 Document Reviewed: 11/23/2016 Elsevier Patient Education  [place].  Iliotibial Bursitis  Iliotibial bursitis is inflammation of the bursa on the outside of the knee. A bursa is a fluid-filled sac that is often found near a joint. Bursas act as cushions to help tendons glide smoothly over bony surfaces during joint  movement. The iliotibial bursa is located beneath a long tendon (iliotibial band) that connects muscles of the buttock, hip, and upper leg to the outside of the shin bone. This condition is also called iliotibial band friction syndrome. What are the causes? This condition is caused by:  Repeated rubbing of the tendon over the bursa, which occurs when you do activities over and over. This friction causes fluid to build up inside the bursa.  The buildup of fluid inside the bursa causes it to swell. The swollen bursa causes pain in the area where it is located. What increases the risk? The following factors may make you more likely to develop this condition:  Doing athletic activities that involve repetitive squatting, running, cutting, and side-to-side movements.  Overtraining, or starting a new athletic activity without gradually increasing your time and distance.  Participating in  certain sports, such as: ? Basketball. ? Cross-country running. ? Football. ? Rugby. ? Racquet sports. ? Soccer. ? Volleyball. ? Cycling.  Being 34-58 years old and having knee arthritis.  Being a middle-aged woman who is overweight.  Having flat feet or knee deformities.  Having diabetes. What are the signs or symptoms? Symptoms of this condition include:  Pain on the outside of your knee. The pain may also be felt on the outside of your leg near the knee.  Tenderness when pressing on the side of your knee.  Knee swelling that may or may not include increased warmth or redness.  Pain that gets worse with activity such as: ? Kneeling. ? Walking down the stairs. ? Prolonged walking or running. How is this diagnosed? This condition is usually diagnosed based on your symptoms, your medical history, and a physical exam. During the exam, your health care provider will check your:  Knee motion.  Knee strength.  Amount of pain when the outside of your knee is touched or pressed on.  Ability to  do activities such as walking or climbing stairs. Rarely, other tests may be done to rule out other causes of your symptoms. These tests may include:  MRI.  Ultrasound. How is this treated? Treatment for this condition may include:  Avoiding activities that cause pain and swelling.  Icing your knee.  Applying heat to your knee.  Wearing an elastic wrap or sleeve to support your knee.  Keeping your knee raised (elevated) when resting.  Taking medicine to reduce pain and swelling.  Having an injection of numbing medicine or anti-inflammatory medicine (steroid) into the bursa to see if the pain will go away.  Doing stretching and strengthening exercises. Treatment usually improves the pain in 6-8 weeks. Surgery is sometimes needed to drain or remove the bursa. Follow these instructions at home: If you have a compression wrap or sleeve:  Wear it as told by your health care provider. Remove it only as told by your health care provider.  Loosen the wrap or sleeve if your foot or toes tingle, become numb, or turn cold and blue.  Keep the wrap or sleeve clean.  If the wrap or sleeve is not waterproof: ? Do not let it get wet. ? Cover it with a watertight covering when you take a bath or shower. Managing pain, stiffness, and swelling      If directed, put ice on the knee. ? If you have a removable wrap or sleeve, remove it as told by your health care provider. ? Put ice in a plastic bag. ? Place a towel between your skin and the bag. ? Leave the ice on for 20 minutes, 2-3 times a day.  Move your toes often to avoid stiffness and to lessen swelling.  Raise (elevate) your leg above the level of your heart while you are sitting or lying down.  If directed, apply heat to the affected area. Use the heat source that your health care provider recommends, such as a moist heat pack or a heating pad. ? Place a towel between your skin and the heat source. ? Leave the heat on for  20-30 minutes. ? Remove the heat if your skin turns bright red. This is especially important if you are unable to feel pain, heat, or cold. You may have a greater risk of getting burned. Activity  Return to your normal activities as told by your health care provider. Ask your health care provider what activities are  safe for you.  Do exercises as told by your health care provider. General instructions  Take over-the-counter and prescription medicines only as told by your health care provider.  Keep all follow-up visits as told by your health care provider. This is important. How is this prevented?  Warm up and stretch before being active.  Cool down and stretch after being active.  Give your body time to rest between periods of activity.  Make sure to use equipment that fits you.  Maintain physical fitness, including: ? Strength. ? Flexibility. Contact a health care provider if:  You have pain that is not relieved by rest or treatment.  Your symptoms get worse or do not improve with home care. Summary  Iliotibial bursitis is inflammation of the bursa on the outside of the knee.  Symptoms of this condition include pain on the outside of the knee that gets worse with activity.  Treatment includes resting the knee, ice, compression, and sometimes pain medicines. This information is not intended to replace advice given to you by your health care provider. Make sure you discuss any questions you have with your health care provider. Document Revised: 12/04/2018 Document Reviewed: 01/29/2018 Elsevier Patient Education  [place] Ask your health care provider which exercises are safe for you. Do exercises exactly as told by your health care provider and adjust them as directed. It is normal to feel mild stretching, pulling, tightness, or discomfort as you do these exercises. Stop right away if you feel sudden pain or your pain gets worse. Do not  begin these exercises until told by your health care provider. Stretching and range-of-motion exercises These exercises warm up your muscles and joints and improve the movement and flexibility of your leg. These exercises also help to relieve pain and stiffness. Quadriceps stretch, prone  1. Lie on your abdomen (prone position) on a firm surface, such as a bed or padded floor. 2. Bend your left / right knee and reach back to hold your ankle or pant leg. If you cannot reach your ankle or pant leg, loop a belt around your foot and grab the belt instead. 3. Gently pull your heel toward your buttocks. Your knee should not slide out to the side. You should feel a stretch in the front of your thigh and knee (quadriceps). 4. Hold this position for __________ seconds. Repeat __________ times. Complete this exercise __________ times a day. Lunge This exercise stretches the muscle in the inner thigh (adductor). 1. Stand and spread your legs about 3 ft (1 m) apart. Put your left / right leg slightly back for balance. 2. Lean away from your left / right leg by bending your other knee and shifting your weight toward your bent knee. You may rest your hands on your thigh for balance. You should feel a stretch in your left / right inner thigh. 3. Hold this position for __________ seconds. Repeat __________ times. Complete this exercise __________ times a day. Hamstring stretch, supine  1. Lie on your back (supine position). 2. Loop a belt or towel over the ball of your left / right foot. The ball of your foot is on the walking surface, right under your toes. 3. Straighten your left / right knee and slowly pull on the belt or towel to raise your leg. Stop when you feel a gentle stretch in the back of your left / right knee or thigh (hamstrings). ? Do not let your left / right knee  bend. ? Keep your other leg flat on the floor. 4. Hold this position for __________ seconds. Repeat __________ times. Complete  this exercise __________ times a day. Strengthening exercises These exercises build strength and endurance in your leg. Endurance is the ability to use your muscles for a long time, even after they get tired. Wall slides This exercise strengthens the muscles in the front of your thigh and knee (quadriceps). 1. Lean your back against a smooth wall or door, and walk your feet out 18-24 inches (46-61 cm) from it. 2. Place your feet hip-width apart. 3. Slowly slide down the wall or door until your knees bend as far as told by your health care provider. Keep your knees over your heels, not your toes. Keep your knees in line with your hips. 4. Hold this position for __________ seconds. 5. Use the muscles in the front of your thigh to push yourself up to the standing position. 6. Rest for __________ seconds after each repetition. Repeat __________ times. Complete this exercise __________ times a day. Straight leg raises, side-lying This exercise is sometimes called a hip abductor exercise. It strengthens the muscles that rotate the leg at the hip and move it away from your body (hip abductors). 1. Lie on your side with your left / right leg in the top position. Lie so your head, shoulder, hip, and knee line up. Bend your bottom knee slightly to help you balance. 2. Lift your top leg 4-6 inches (10-15 cm) while keeping your toes pointed straight ahead. 3. Hold this position for __________ seconds. 4. Slowly lower your leg to the starting position. 5. Let your muscles relax completely after each repetition. Repeat __________ times. Complete this exercise __________ times a day. Straight leg raises, prone This exercise strengthens the muscles that move the hips (hip extensors). 1. Lie on your abdomen (prone position) on a firm surface. You can put a pillow under your hips if that is more comfortable for your lower back. 2. Squeeze your buttocks muscles and lift your left / right leg about 4-6 inches  (10-15 cm). Keep your knee straight as you lift your leg. 3. Hold this position for __________ seconds. 4. Slowly lower your leg to the starting position. 5. Let your muscles relax completely after each repetition. Repeat __________ times. Complete this exercise __________ times a day. Bridge This exercise strengthens the muscles that move the hips (hip extensors). 1. Lie on your back on a firm surface with your knees bent and your feet flat on the floor. 2. Tighten your buttocks muscles and lift your bottom off the floor until the trunk of your body is level with your thighs. ? Do not arch your back. ? You should feel the muscles working in your buttocks and the back of your thighs. If you do not feel these muscles, slide your feet 1-2 inches (2.5-5 cm) farther away from your buttocks. 3. Hold this position for __________ seconds. 4. Slowly lower your hips to the starting position. 5. Let your muscles relax completely after each repetition. 6. If this exercise is too easy, try doing it with your arms crossed over your chest. Repeat __________ times. Complete this exercise __________ times a day. This information is not intended to replace advice given to you by your health care provider. Make sure you discuss any questions you have with your health care provider. Document Revised: 12/04/2018 Document Reviewed: 10/20/2018 Elsevier Patient Education  [place].

## 2019-10-04 ENCOUNTER — Encounter: Payer: Self-pay | Admitting: Family Medicine

## 2019-10-04 NOTE — Progress Notes (Signed)
Subjective:    Patient ID: Renee Watts, female    DOB: 1951/04/02, 69 y.o.   MRN: [id number]  No chief complaint on file.   HPI Patient was seen today for f/u.  Pt with neuropathy in LLE x 3 wks.  Pt notes pain from L hip to ankle.  Pt s/p b/l TKR.  Saw Ortho, given tramadol and prednisone x 6 days.  Now with L lateral knee and shin pain.  Taking gabapentin, since dose increased no longer having numbness and tingling.  Pt notes h/o varicose veins.  Denies inflammation or edema.  Pt also notes recent episode of eyes "feeling foggy".  Had a normal eye exam in Nov/Dec.  Pt notes eyes feel tired late evening, better after a nap.  Drinking 3 bottles of water per day.  Past Medical History:  Diagnosis Date  . Allergy   . Benign paroxysmal positional vertigo 06/25/2013   dx with prior PCP, s/p labs, mri and vestibular rehab; nasal spray for etd seemed to help  . Blood in stool 03/11/2012  . Dependent edema    Bilateral  . Depression   . GERD (gastroesophageal reflux disease)   . Hyperlipidemia   . Low back pain    sciatica; sees orthopedic specialist ([facility])for this and shoulder tendinitis  . Obesity, diabetes, and hypertension syndrome ([place])   . Osteoarthritis, knee    Bilateral, s/p 3 total knee replacement surgerues on right, last 2005. Dr. Tonita Cong  . Pellegrini-Steida syndrome    Myositis ossificans  . Seasonal allergies   . Shingles Jul 10 2012   LESIONS CLEARED BUT NOT TOLERATING ANY THING TIGHT AGAINST BODY--SHINGLES WERE AROSS BACK AND ABDOMEN    No Known Allergies  ROS General: Denies fever, chills, night sweats, changes in weight, changes in appetite HEENT: Denies headaches, ear pain, changes in vision, rhinorrhea, sore throat +eye fatigue CV: Denies CP, palpitations, SOB, orthopnea Pulm: Denies SOB, cough, wheezing GI: Denies abdominal pain, nausea, vomiting, diarrhea, constipation GU: Denies dysuria, hematuria, frequency, vaginal discharge Msk: Denies muscle  cramps, joint pains  + LLE pain Neuro: Denies weakness, numbness, tingling Skin: Denies rashes, bruising Psych: Denies depression, anxiety, hallucinations    Objective:    Blood pressure 126/74, pulse 72, temperature 97.9 F (36.6 C), temperature source Temporal, weight 180 lb (81.6 kg), SpO2 97 %.  Gen. Pleasant, well-nourished, in no distress, normal affect   HEENT: [place]/AT, face symmetric, no scleral icterus, PERRLA, EOMI, nares patent without drainage, pharynx without erythema or exudate. Lungs: no accessory muscle use, CTAB, no wheezes or rales Cardiovascular: RRR, no m/r/g, no peripheral edema Abdomen: BS present, soft, NT/ND. Musculoskeletal: No deformities, no cyanosis or clubbing, normal tone Neuro:  A&Ox3, CN II-XII intact, normal gait Skin:  Warm, no lesions/ rash.  Varicose veins noted on upper medial L thigh without erythema or edema.  Wt Readings from Last 3 Encounters:  09/30/19 180 lb (81.6 kg)  02/17/19 179 lb (81.2 kg)  02/05/19 178 lb (80.7 kg)    Lab Results  Component Value Date   WBC 5.4 04/14/2018   HGB 12.1 04/14/2018   HCT 36.1 04/14/2018   PLT 170.0 04/14/2018   GLUCOSE 135 (H) 08/14/2018   CHOL 184 04/14/2018   TRIG 142.0 04/12/2017   HDL 42.00 04/14/2018   LDLDIRECT 137.8 10/12/2013   LDLCALC 74 04/12/2017   ALT 15 09/10/2012   AST 16 09/10/2012   NA 140 08/14/2018   K 4.2 08/14/2018   CL 102 08/14/2018  CREATININE 0.52 08/14/2018   BUN 11 08/14/2018   CO2 30 08/14/2018   TSH 1.20 05/10/2015   INR 0.94 01/05/2013   HGBA1C 6.4 (H) 08/14/2018   MICROALBUR 0.5 03/11/2014    Assessment/Plan:  Varicose veins of bilateral lower extremities with other complications -mild, not inflammed -discussed TED hose, supportive shoes -discussed further treatment options.  Consider referral to [facility] for worsening symptoms -given handouts  Pain of left lower extremity -discussed possible causes including bursitis, IT band syndrome,  neuropathy, varicose veins -discussed supportive care: NSAIDs, stretching -given handout  Type II diabetes mellitus with neurological manifestations ([place]) -stable -continue lifestyle modifications -continue Metformin 1000 mg BID and gabapentin 300 mg in am and 600 mg qhs  Eye fatigue -possibly 2/2 hypo or hyperglycemic episode.  However must consider myasthenia gravis an other neuromuscular d/o -regular hydration and eating encouraged. -for continued or worsening symptoms evaluate for myasthenia gravis.  F/u prn in 1 month  Grier Mitts, MD

## 2019-10-12 ENCOUNTER — Telehealth: Payer: Self-pay | Admitting: Family Medicine

## 2019-10-12 NOTE — Telephone Encounter (Signed)
Attempted to call pt pharmacy, on hold for long no success of speaking with a pharmacist, will try later

## 2019-10-12 NOTE — Telephone Encounter (Signed)
Pt states that the Gabapentin is incorrect. It should be changed to 3 times a day according to her last visit she had with Banks.  Medication Gabapentin 3 times a day Pharmacy: [facility] [address]  Joylene Igo: [phone]   Pt can be reached at [phone] if needed

## 2019-10-14 NOTE — Telephone Encounter (Signed)
Spoke with pt pharmacy gave clarifications on the directions of her Gabapentin 300mg 

## 2019-10-26 ENCOUNTER — Other Ambulatory Visit: Payer: Self-pay

## 2019-10-28 ENCOUNTER — Ambulatory Visit (INDEPENDENT_AMBULATORY_CARE_PROVIDER_SITE_OTHER): Payer: PPO | Admitting: Family Medicine

## 2019-10-28 ENCOUNTER — Ambulatory Visit (INDEPENDENT_AMBULATORY_CARE_PROVIDER_SITE_OTHER): Payer: PPO

## 2019-10-28 ENCOUNTER — Encounter: Payer: Self-pay | Admitting: Family Medicine

## 2019-10-28 ENCOUNTER — Other Ambulatory Visit: Payer: Self-pay

## 2019-10-28 VITALS — BP 110/70 | HR 78 | Temp 97.8°F | Wt 177.0 lb

## 2019-10-28 DIAGNOSIS — M25562 Pain in left knee: Secondary | ICD-10-CM

## 2019-10-28 DIAGNOSIS — T148XXA Other injury of unspecified body region, initial encounter: Secondary | ICD-10-CM

## 2019-10-28 DIAGNOSIS — W19XXXA Unspecified fall, initial encounter: Secondary | ICD-10-CM | POA: Diagnosis not present

## 2019-10-28 DIAGNOSIS — S8992XA Unspecified injury of left lower leg, initial encounter: Secondary | ICD-10-CM | POA: Diagnosis not present

## 2019-10-28 MED ORDER — MELOXICAM 7.5 MG PO TABS: 7.5000 mg | ORAL_TABLET | Freq: Every day | ORAL | 0 refills | Status: DC

## 2019-10-28 MED ORDER — CYCLOBENZAPRINE HCL 5 MG PO TABS: 5.0000 mg | ORAL_TABLET | Freq: Every evening | ORAL | 0 refills | Status: DC | PRN

## 2019-10-28 NOTE — Patient Instructions (Signed)
Acute Knee Pain, Adult Acute knee pain is sudden and may be caused by damage, swelling, or irritation of the muscles and tissues that support your knee. The injury may result from:  A fall.  An injury to your knee from twisting motions.  A hit to the knee.  Infection. Acute knee pain may go away on its own with time and rest. If it does not, your health care provider may order tests to find the cause of the pain. These may include:  Imaging tests, such as an X-ray, MRI, or ultrasound.  Joint aspiration. In this test, fluid is removed from the knee.  Arthroscopy. In this test, a lighted tube is inserted into the knee and an image is projected onto a TV screen.  Biopsy. In this test, a sample of tissue is removed from the body and studied under a microscope. Follow these instructions at home: Pay attention to any changes in your symptoms. Take these actions to relieve your pain. If you have a knee sleeve or brace:   Wear the sleeve or brace as told by your health care provider. Remove it only as told by your health care provider.  Loosen the sleeve or brace if your toes tingle, become numb, or turn cold and blue.  Keep the sleeve or brace clean.  If the sleeve or brace is not waterproof: ? Do not let it get wet. ? Cover it with a watertight covering when you take a bath or shower. Activity  Rest your knee.  Do not do things that cause pain or make pain worse.  Avoid high-impact activities or exercises, such as running, jumping rope, or doing jumping jacks.  Work with a physical therapist to make a safe exercise program, as recommended by your health care provider. Do exercises as told by your physical therapist. Managing pain, stiffness, and swelling   If directed, put ice on the knee: ? Put ice in a plastic bag. ? Place a towel between your skin and the bag. ? Leave the ice on for 20 minutes, 2-3 times a day.  If directed, use an elastic bandage to put pressure  (compression) on your injured knee. This may control swelling, give support, and help with discomfort. General instructions  Take over-the-counter and prescription medicines only as told by your health care provider.  Raise (elevate) your knee above the level of your heart when you are sitting or lying down.  Sleep with a pillow under your knee.  Do not use any products that contain nicotine or tobacco, such as cigarettes, e-cigarettes, and chewing tobacco. These can delay healing. If you need help quitting, ask your health care provider.  If you are overweight, work with your health care provider and a dietitian to set a weight-loss goal that is healthy and reasonable for you. Extra weight can put pressure on your knee.  Keep all follow-up visits as told by your health care provider. This is important. Contact a health care provider if:  Your knee pain continues, changes, or gets worse.  You have a fever along with knee pain.  Your knee feels warm to the touch.  Your knee buckles or locks up. Get help right away if:  Your knee swells, and the swelling becomes worse.  You cannot move your knee.  You have severe pain in your knee. Summary  Acute knee pain can be caused by a fall, an injury, an infection, or damage, swelling, or irritation of the tissues that support your knee.  Your health care provider may perform tests to find out the cause of the pain.  Pay attention to any changes in your symptoms. Relieve your pain with rest, medicines, light activity, and use of ice.  Get help if your pain continues or becomes worse, your knee swells, or you cannot move your knee. This information is not intended to replace advice given to you by your health care provider. Make sure you discuss any questions you have with your health care provider. Document Revised: 01/23/2018 Document Reviewed: 01/23/2018 Elsevier Patient Education  [place] Prevention in the Home,  Adult Falls can cause injuries and can affect people from all age groups. There are many simple things that you can do to make your home safe and to help prevent falls. Ask for help when making these changes, if needed. What actions can I take to prevent falls? General instructions  Use good lighting in all rooms. Replace any light bulbs that burn out.  Turn on lights if it is dark. Use night-lights.  Place frequently used items in easy-to-reach places. Lower the shelves around your home if necessary.  Set up furniture so that there are clear paths around it. Avoid moving your furniture around.  Remove throw rugs and other tripping hazards from the floor.  Avoid walking on wet floors.  Fix any uneven floor surfaces.  Add color or contrast paint or tape to grab bars and handrails in your home. Place contrasting color strips on the first and last steps of stairways.  When you use a stepladder, make sure that it is completely opened and that the sides are firmly locked. Have someone hold the ladder while you are using it. Do not climb a closed stepladder.  Be aware of any and all pets. What can I do in the bathroom?      Keep the floor dry. Immediately clean up any water that spills onto the floor.  Remove soap buildup in the tub or shower on a regular basis.  Use non-skid mats or decals on the floor of the tub or shower.  Attach bath mats securely with double-sided, non-slip rug tape.  If you need to sit down while you are in the shower, use a plastic, non-slip stool.  Install grab bars by the toilet and in the tub and shower. Do not use towel bars as grab bars. What can I do in the bedroom?  Make sure that a bedside light is easy to reach.  Do not use oversized bedding that drapes onto the floor.  Have a firm chair that has side arms to use for getting dressed. What can I do in the kitchen?  Clean up any spills right away.  If you need to reach for something above  you, use a sturdy step stool that has a grab bar.  Keep electrical cables out of the way.  Do not use floor polish or wax that makes floors slippery. If you must use wax, make sure that it is non-skid floor wax. What can I do in the stairways?  Do not leave any items on the stairs.  Make sure that you have a light switch at the top of the stairs and the bottom of the stairs. Have them installed if you do not have them.  Make sure that there are handrails on both sides of the stairs. Fix handrails that are broken or loose. Make sure that handrails are as long as the stairways.  Install non-slip stair  treads on all stairs in your home.  Avoid having throw rugs at the top or bottom of stairways, or secure the rugs with carpet tape to prevent them from moving.  Choose a carpet design that does not hide the edge of steps on the stairway.  Check any carpeting to make sure that it is firmly attached to the stairs. Fix any carpet that is loose or worn. What can I do on the outside of my home?  Use bright outdoor lighting.  Regularly repair the edges of walkways and driveways and fix any cracks.  Remove high doorway thresholds.  Trim any shrubbery on the main path into your home.  Regularly check that handrails are securely fastened and in good repair. Both sides of any steps should have handrails.  Install guardrails along the edges of any raised decks or porches.  Clear walkways of debris and clutter, including tools and rocks.  Have leaves, snow, and ice cleared regularly.  Use sand or salt on walkways during winter months.  In the garage, clean up any spills right away, including grease or oil spills. What other actions can I take?  Wear closed-toe shoes that fit well and support your feet. Wear shoes that have rubber soles or low heels.  Use mobility aids as needed, such as canes, walkers, scooters, and crutches.  Review your medicines with your health care provider. Some  medicines can cause dizziness or changes in blood pressure, which increase your risk of falling. Talk with your health care provider about other ways that you can decrease your risk of falls. This may include working with a physical therapist or trainer to improve your strength, balance, and endurance. Where to find more information  Centers for Disease Control and Prevention, [place]: WebmailGuide.co.za  [employer] on Aging: BrainJudge.co.uk Contact a health care provider if:  You are afraid of falling at home.  You feel weak, drowsy, or dizzy at home.  You fall at home. Summary  There are many simple things that you can do to make your home safe and to help prevent falls.  Ways to make your home safe include removing tripping hazards and installing grab bars in the bathroom.  Ask for help when making these changes in your home. This information is not intended to replace advice given to you by your health care provider. Make sure you discuss any questions you have with your health care provider. Document Revised: 07/26/2017 Document Reviewed: 03/28/2017 Elsevier Patient Education  2020 [place].  Muscle Strain A muscle strain is an injury that happens when a muscle is stretched longer than normal. This can happen during a fall, sports, or lifting. This can tear some muscle fibers. Usually, recovery from muscle strain takes 1-2 weeks. Complete healing normally takes 5-6 weeks. This condition is first treated with PRICE therapy. This involves:  Protecting your muscle from being injured again.  Resting your injured muscle.  Icing your injured muscle.  Applying pressure (compression) to your injured muscle. This may be done with a splint or elastic bandage.  Raising (elevating) your injured muscle. Your doctor may also recommend medicine for pain. Follow these instructions at home: If you have a splint:  Wear the splint as told by your doctor. Take it  off only as told by your doctor.  Loosen the splint if your fingers or toes tingle, get numb, or turn cold and blue.  Keep the splint clean.  If the splint is not waterproof: ? Do not let it  get wet. ? Cover it with a watertight covering when you take a bath or a shower. Managing pain, stiffness, and swelling   If directed, put ice on your injured area. ? If you have a removable splint, take it off as told by your doctor. ? Put ice in a plastic bag. ? Place a towel between your skin and the bag. ? Leave the ice on for 20 minutes, 2-3 times a day.  Move your fingers or toes often. This helps to avoid stiffness and lessen swelling.  Raise your injured area above the level of your heart while you are sitting or lying down.  Wear an elastic bandage as told by your doctor. Make sure it is not too tight. General instructions  Take over-the-counter and prescription medicines only as told by your doctor.  Limit your activity. Rest your injured muscle as told by your doctor. Your doctor may say that gentle movements are okay.  If physical therapy was prescribed, do exercises as told by your doctor.  Do not put pressure on any part of the splint until it is fully hardened. This may take many hours.  Do not use any products that contain nicotine or tobacco, such as cigarettes and e-cigarettes. These can delay bone healing. If you need help quitting, ask your doctor.  Warm up before you exercise. This helps to prevent more muscle strains.  Ask your doctor when it is safe to drive if you have a splint.  Keep all follow-up visits as told by your doctor. This is important. Contact a doctor if:  You have more pain or swelling in your injured area. Get help right away if:  You have any of these problems in your injured area: ? You have numbness. ? You have tingling. ? You lose a lot of strength. Summary  A muscle strain is an injury that happens when a muscle is stretched longer  than normal.  This condition is first treated with PRICE therapy. This includes protecting, resting, icing, adding pressure, and raising your injury.  Limit your activity. Rest your injured muscle as told by your doctor. Your doctor may say that gentle movements are okay.  Warm up before you exercise. This helps to prevent more muscle strains. This information is not intended to replace advice given to you by your health care provider. Make sure you discuss any questions you have with your health care provider. Document Revised: 10/09/2018 Document Reviewed: 09/19/2016 Elsevier Patient Education  [place].

## 2019-10-28 NOTE — Progress Notes (Signed)
Subjective:    Patient ID: Renee Watts, female    DOB: 08/06/1951, 69 y.o.   MRN: [id number]  No chief complaint on file.   HPI Patient was seen today for acute concern.  Pt endorses fall from standing 2/28 while trying to kill an ant.  Pt tried to hold on the window for support but grabbed the drape causing her to fall.  Pt landed on her butt, hitting her L elbow, L temple, and L knee.  Pt endorse feeling sore all over.  Having soreness and pain in L knee, but no edema, erythema, or ecchymosis.  Pt tried tylenol for her symptoms.  Pt has a h/o b/l TKR.  Past Medical History:  Diagnosis Date  . Allergy   . Benign paroxysmal positional vertigo 06/25/2013   dx with prior PCP, s/p labs, mri and vestibular rehab; nasal spray for etd seemed to help  . Blood in stool 03/11/2012  . Dependent edema    Bilateral  . Depression   . GERD (gastroesophageal reflux disease)   . Hyperlipidemia   . Low back pain    sciatica; sees orthopedic specialist ([facility])for this and shoulder tendinitis  . Obesity, diabetes, and hypertension syndrome ([place])   . Osteoarthritis, knee    Bilateral, s/p 3 total knee replacement surgerues on right, last 2005. Dr. Tonita Cong  . Pellegrini-Steida syndrome    Myositis ossificans  . Seasonal allergies   . Shingles Jul 10 2012   LESIONS CLEARED BUT NOT TOLERATING ANY THING TIGHT AGAINST BODY--SHINGLES WERE AROSS BACK AND ABDOMEN    No Known Allergies  ROS General: Denies fever, chills, night sweats, changes in weight, changes in appetite HEENT: Denies headaches, ear pain, changes in vision, rhinorrhea, sore throat CV: Denies CP, palpitations, SOB, orthopnea Pulm: Denies SOB, cough, wheezing GI: Denies abdominal pain, nausea, vomiting, diarrhea, constipation GU: Denies dysuria, hematuria, frequency, vaginal discharge Msk: Denies muscle cramps, joint pains  +body soreness, L knee pain/soreness Neuro: Denies weakness, numbness, tingling Skin: Denies rashes,  bruising  +scab on L elbow Psych: Denies depression, anxiety, hallucinations      Objective:    Blood pressure 110/70, pulse 78, temperature 97.8 F (36.6 C), temperature source Temporal, weight 177 lb (80.3 kg), SpO2 98 %.   Gen. Pleasant, well-nourished, in no distress, normal affect   HEENT: [place]/AT, face symmetric, no scleral icterus, PERRLA, EOMI, nares patent without drainage Lungs: no accessory muscle use, CTAB, no wheezes or rales Cardiovascular: RRR, no m/r/g, no peripheral edema Musculoskeletal: No TTP of cervical or thoracic spine.  Mild TTP of lower lumbar spine and lumbar paraspinal muscles.  TTP of L knee at tibial tuberosity and popliteal area.  No edema.  No deformities, no cyanosis or clubbing, normal tone. Neuro:  A&Ox3, CN II-XII intact, normal gait Skin:  Warm, no rash.  L elbow with eschar, no erythema or drainage.  Well healed surgical incisions of b/l knees.   Wt Readings from Last 3 Encounters:  09/30/19 180 lb (81.6 kg)  02/17/19 179 lb (81.2 kg)  02/05/19 178 lb (80.7 kg)    Lab Results  Component Value Date   WBC 5.4 04/14/2018   HGB 12.1 04/14/2018   HCT 36.1 04/14/2018   PLT 170.0 04/14/2018   GLUCOSE 135 (H) 08/14/2018   CHOL 184 04/14/2018   TRIG 142.0 04/12/2017   HDL 42.00 04/14/2018   LDLDIRECT 137.8 10/12/2013   LDLCALC 74 04/12/2017   ALT 15 09/10/2012   AST 16 09/10/2012   NA  140 08/14/2018   K 4.2 08/14/2018   CL 102 08/14/2018   CREATININE 0.52 08/14/2018   BUN 11 08/14/2018   CO2 30 08/14/2018   TSH 1.20 05/10/2015   INR 0.94 01/05/2013   HGBA1C 6.4 (H) 08/14/2018   MICROALBUR 0.5 03/11/2014    Assessment/Plan:  Acute pain of left knee  -given h/o TKR and TTP on exam will obtain imaging. -supportive care -given precautions - Plan: DG Knee Complete 4 Views Left, meloxicam (MOBIC) 7.5 MG tablet, cyclobenzaprine (FLEXERIL) 5 MG tablet  Fall from standing, initial encounter  - Plan: DG Knee Complete 4 Views Left  Muscle  strain  - Plan: meloxicam (MOBIC) 7.5 MG tablet, cyclobenzaprine (FLEXERIL) 5 MG tablet  F/u prn  Grier Mitts, MD

## 2019-10-29 ENCOUNTER — Telehealth: Payer: Self-pay | Admitting: Family Medicine

## 2019-10-29 ENCOUNTER — Other Ambulatory Visit: Payer: Self-pay

## 2019-10-29 MED ORDER — ACCU-CHEK GUIDE VI STRP: ORAL_STRIP | 1 refills | Status: DC

## 2019-10-29 NOTE — Telephone Encounter (Signed)
Pt called and informed that the pharmacy did told her they did not receive prescription for testing strips and also the muscle relaxer sent in would need authorization from insurance.  [place]

## 2019-10-30 ENCOUNTER — Other Ambulatory Visit: Payer: Self-pay | Admitting: Family Medicine

## 2019-10-30 MED ORDER — TIZANIDINE HCL 4 MG PO TABS: 4.0000 mg | ORAL_TABLET | Freq: Four times a day (QID) | ORAL | 0 refills | Status: DC | PRN

## 2019-10-30 NOTE — Telephone Encounter (Signed)
Pt stated she received a call from Insurance and they denied the PA for flexeril and she should receive a letter within 2 to 3 days. Insurance did not offer other medications. Pt just wanted to inform Dr. Volanda Napoleon. Pt is wondering if there is another medication that she could try for her situation?  Pt can be reached at [phone] or [phone]   Pharmacy: [facility]: [phone]

## 2019-10-30 NOTE — Telephone Encounter (Signed)
Rx for  tizanidine sent

## 2019-10-30 NOTE — Telephone Encounter (Signed)
Pt is calling back stating that she has called her insurance company and the flexeril does need PA the insurance company will be sending over the PA for completion.

## 2019-10-30 NOTE — Telephone Encounter (Signed)
Please advise 

## 2019-10-30 NOTE — Telephone Encounter (Signed)
Spoke with pt advised that Dr Volanda Napoleon sent in a different Rx to her pharmacy

## 2019-10-30 NOTE — Telephone Encounter (Signed)
Spoke with pt state that she will call her insurance to find out which muscle relaxer they will cover, Pt advised to call the office with information

## 2019-10-30 NOTE — Telephone Encounter (Signed)
FYI Rx sent for Prior Authorization

## 2019-10-30 NOTE — Telephone Encounter (Signed)
Test strips sent in yesterday.  Is there another muscle relaxer the insurance would prefer so that pt does not have to wait?

## 2019-11-22 ENCOUNTER — Other Ambulatory Visit: Payer: Self-pay | Admitting: Family Medicine

## 2019-11-28 ENCOUNTER — Other Ambulatory Visit: Payer: Self-pay | Admitting: Family Medicine

## 2019-11-28 DIAGNOSIS — M25562 Pain in left knee: Secondary | ICD-10-CM

## 2019-11-28 DIAGNOSIS — T148XXA Other injury of unspecified body region, initial encounter: Secondary | ICD-10-CM

## 2019-12-29 ENCOUNTER — Other Ambulatory Visit: Payer: Self-pay | Admitting: Family Medicine

## 2019-12-29 DIAGNOSIS — M25562 Pain in left knee: Secondary | ICD-10-CM

## 2019-12-29 DIAGNOSIS — T148XXA Other injury of unspecified body region, initial encounter: Secondary | ICD-10-CM

## 2020-01-22 ENCOUNTER — Ambulatory Visit (INDEPENDENT_AMBULATORY_CARE_PROVIDER_SITE_OTHER): Payer: PPO | Admitting: Family Medicine

## 2020-01-22 ENCOUNTER — Other Ambulatory Visit: Payer: Self-pay

## 2020-01-22 ENCOUNTER — Encounter: Payer: Self-pay | Admitting: Family Medicine

## 2020-01-22 VITALS — BP 118/64 | HR 70 | Temp 97.9°F | Wt 178.0 lb

## 2020-01-22 DIAGNOSIS — I10 Essential (primary) hypertension: Secondary | ICD-10-CM

## 2020-01-22 DIAGNOSIS — R600 Localized edema: Secondary | ICD-10-CM | POA: Diagnosis not present

## 2020-01-22 DIAGNOSIS — E1149 Type 2 diabetes mellitus with other diabetic neurological complication: Secondary | ICD-10-CM | POA: Diagnosis not present

## 2020-01-22 LAB — HEMOGLOBIN A1C: Hgb A1c MFr Bld: 6.5 % (ref 4.6–6.5)

## 2020-01-22 LAB — BASIC METABOLIC PANEL
BUN: 10 mg/dL (ref 6–23)
CO2: 29 mEq/L (ref 19–32)
Calcium: 9.7 mg/dL (ref 8.4–10.5)
Chloride: 102 mEq/L (ref 96–112)
Creatinine, Ser: 0.59 mg/dL (ref 0.40–1.20)
GFR: 121.98 mL/min (ref >60.00)
Glucose, Bld: 101 mg/dL — ABNORMAL HIGH (ref 70–99)
Potassium: 4 mEq/L (ref 3.5–5.1)
Sodium: 139 mEq/L (ref 135–145)

## 2020-01-22 LAB — CBC WITH DIFFERENTIAL/PLATELET
Basophils Absolute: 0 10*3/uL (ref 0.0–0.1)
Basophils Relative: 0.8 % (ref 0.0–3.0)
Eosinophils Absolute: 0.5 10*3/uL (ref 0.0–0.7)
Eosinophils Relative: 7.8 % — ABNORMAL HIGH (ref 0.0–5.0)
HCT: 34.4 % — ABNORMAL LOW (ref 36.0–46.0)
Hemoglobin: 11.5 g/dL — ABNORMAL LOW (ref 12.0–15.0)
Lymphocytes Relative: 24.5 % (ref 12.0–46.0)
Lymphs Abs: 1.6 10*3/uL (ref 0.7–4.0)
MCHC: 33.5 g/dL (ref 30.0–36.0)
MCV: 94.3 fl (ref 78.0–100.0)
Monocytes Absolute: 0.6 10*3/uL (ref 0.1–1.0)
Monocytes Relative: 8.4 % (ref 3.0–12.0)
Neutro Abs: 3.9 10*3/uL (ref 1.4–7.7)
Neutrophils Relative %: 58.5 % (ref 43.0–77.0)
Platelets: 185 10*3/uL (ref 150.0–400.0)
RBC: 3.65 Mil/uL — ABNORMAL LOW (ref 3.87–5.11)
RDW: 13.6 % (ref 11.5–15.5)
WBC: 6.6 10*3/uL (ref 4.0–10.5)

## 2020-01-22 LAB — BRAIN NATRIURETIC PEPTIDE: Pro B Natriuretic peptide (BNP): 61 pg/mL (ref 0.0–100.0)

## 2020-01-22 MED ORDER — POTASSIUM CHLORIDE CRYS ER 20 MEQ PO TBCR: 20.0000 meq | EXTENDED_RELEASE_TABLET | Freq: Every day | ORAL | 3 refills | Status: DC

## 2020-01-22 MED ORDER — GLUCOSE BLOOD VI STRP: ORAL_STRIP | 12 refills | Status: DC

## 2020-01-22 MED ORDER — ACCU-CHEK GUIDE VI STRP: ORAL_STRIP | 3 refills | Status: DC

## 2020-01-22 NOTE — Progress Notes (Signed)
Subjective:    Patient ID: Renee Watts, female    DOB: [birth year], 69 y.o.   MRN: [id number]  No chief complaint on file.   HPI Patient was seen today for acute concern.  Pt endorses ongoing LE edema.  R ankle worse than L.  Ankles are not painful, erythematous, or with lesions.  Pt denies other edema or joint swelling.  Pt wakes up between 2-4 am and sits on the couch with her legs dangling for a few hrs in the am.  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   Wears compression socks at work.  Pt denies increased sodium intake.  Has spider veins that are not painful.    Taking lisinopril 20 mg daily and lasix 20 mg daily.    Pt needs a refill on her test strips as she had to change glucometers.  Past Medical History:  Diagnosis Date  . Allergy   . Benign paroxysmal positional vertigo 06/25/2013   dx with prior PCP, s/p labs, mri and vestibular rehab; nasal spray for etd seemed to help  . Blood in stool 03/11/2012  . Dependent edema    Bilateral  . Depression   . GERD (gastroesophageal reflux disease)   . Hyperlipidemia   . Low back pain    sciatica; sees orthopedic specialist ([facility])for this and shoulder tendinitis  . Obesity, diabetes, and hypertension syndrome ([place])   . Osteoarthritis, knee    Bilateral, s/p 3 total knee replacement surgerues on right, last 2005. Dr. Tonita Cong  . Pellegrini-Steida syndrome    Myositis ossificans  . Seasonal allergies   . Shingles Jul 10 2012   LESIONS CLEARED BUT NOT TOLERATING ANY THING TIGHT AGAINST BODY--SHINGLES WERE AROSS BACK AND ABDOMEN    No Known Allergies  ROS General: Denies fever, chills, night sweats, changes in weight, changes in appetite HEENT: Denies headaches, ear pain, changes in vision, rhinorrhea, sore throat CV: Denies CP, palpitations, SOB, orthopnea  +LE edema Pulm: Denies SOB, cough, wheezing GI: Denies abdominal pain, nausea, vomiting, diarrhea,  constipation GU: Denies dysuria, hematuria, frequency, vaginal discharge Msk: Denies muscle cramps, joint pains Neuro: Denies weakness, numbness, tingling Skin: Denies rashes, bruising Psych: Denies depression, anxiety, hallucinations    Objective:    There were no vitals taken for this visit.  Gen. Pleasant, well-nourished, in no distress, normal affect   HEENT: [place]/AT, face symmetric, conjunctiva clear, no scleral icterus, PERRLA, EOMI, nares patent without drainage Lungs: no accessory muscle use, CTAB, no wheezes or rales Cardiovascular: RRR, no m/r/g, 1+ pitting edema in b/l LEs.  R ankle >L ankle. Abdomen: BS present, soft, NT/ND, no hepatosplenomegaly. Musculoskeletal: Wearing knee brace on R knee. Negative No deformities, no cyanosis or clubbing, normal tone Neuro:  A&Ox3, CN II-XII intact, normal gait Skin:  Warm, no lesions/ rash.  B/l LEs with mild hyperpigmentation, pitting edema, no induration, no lesions.  Diabetic Foot Exam - Simple   Simple Foot Form Diabetic Foot exam was performed with the following findings: Yes 01/22/2020  1:02 PM  Visual Inspection No deformities, no ulcerations, no other skin breakdown bilaterally: Yes Sensation Testing Intact to touch and monofilament testing bilaterally: Yes Pulse Check Posterior Tibialis and Dorsalis pulse intact bilaterally: Yes Comments     Wt Readings from Last 3 Encounters:  10/28/19 177 lb (80.3 kg)  09/30/19 180 lb (81.6 kg)  02/17/19 179 lb (81.2 kg)    Lab Results  Component  Value Date   WBC 5.4 04/14/2018   HGB 12.1 04/14/2018   HCT 36.1 04/14/2018   PLT 170.0 04/14/2018   GLUCOSE 135 (H) 08/14/2018   CHOL 184 04/14/2018   TRIG 142.0 04/12/2017   HDL 42.00 04/14/2018   LDLDIRECT 137.8 10/12/2013   LDLCALC 74 04/12/2017   ALT 15 09/10/2012   AST 16 09/10/2012   NA 140 08/14/2018   K 4.2 08/14/2018   CL 102 08/14/2018   CREATININE 0.52 08/14/2018   BUN 11 08/14/2018   CO2 30 08/14/2018   TSH 1.20  05/10/2015   INR 0.94 01/05/2013   HGBA1C 6.4 (H) 08/14/2018   MICROALBUR 0.5 03/11/2014    Assessment/Plan:  Bilateral lower extremity edema - -likely dependant edema given hx -discussed supportive care-elevating extremities, reducing sodium intake, TED hose/compression socks -will increase lasix to 40 mg, decrease lisinopril to 10 mg (a half of regular dose) x the next 3 days. -will monitor bp daily.  If pt tolerates this will consider changing regular regimen to help with edema. -will send in rx for potassium supplement since increasing lasix dose. -given precautions  Plan: Brain Natriuretic Peptide, Basic metabolic panel, CBC with Differential/Platelet, potassium chloride SA (KLOR-CON) 20 MEQ tablet  Type II diabetes mellitus with neurological manifestations (Renee Watts) -Foot exam done this visit -We will obtain hemoglobin A1c -Continue current medications: Metformin 1000 BID -Gabapentin 300 mg in am an 600 mg in pm for neuropathy -On ACEI and ASA 81 mg daily  - Plan: glucose blood test strip, hemoglobin A1c  Essential hypertension -Controlled -We will make above adjustments to blood pressure medications in attempt to aid for extremity edema -Patient to check BP daily -If tolerates above adjustments over the next 3 days will change daily regimen. -Given precautions  F/u prn  Grier Mitts, MD

## 2020-01-22 NOTE — Patient Instructions (Addendum)
Take lasix 40 mg daily for the next 3 days.  While you are doing this take 1/2 of the lisinopril which would be 10 mg.  I have sent in a prescription for potassium supplement that you can take over the next 3 days.  Check you blood pressure daily.    Peripheral Edema  Peripheral edema is swelling that is caused by a buildup of fluid. Peripheral edema most often affects the lower legs, ankles, and feet. It can also develop in the arms, hands, and face. The area of the body that has peripheral edema will look swollen. It may also feel heavy or warm. Your clothes may start to feel tight. Pressing on the area may make a temporary dent in your skin. You may not be able to move your swollen arm or leg as much as usual. There are many causes of peripheral edema. It can happen because of a complication of other conditions such as congestive heart failure, kidney disease, or a problem with your blood circulation. It also can be a side effect of certain medicines or because of an infection. It often happens to women during pregnancy. Sometimes, the cause is not known. Follow these instructions at home: Managing pain, stiffness, and swelling   Raise (elevate) your legs while you are sitting or lying down.  Move around often to prevent stiffness and to lessen swelling.  Do not sit or stand for long periods of time.  Wear support stockings as told by your health care provider. Medicines  Take over-the-counter and prescription medicines only as told by your health care provider.  Your health care provider may prescribe medicine to help your body get rid of excess water (diuretic). General instructions  Pay attention to any changes in your symptoms.  Follow instructions from your health care provider about limiting salt (sodium) in your diet. Sometimes, eating less salt may reduce swelling.  Moisturize skin daily to help prevent skin from cracking and draining.  Keep all follow-up visits as told by  your health care provider. This is important. Contact a health care provider if you have:  A fever.  Edema that starts suddenly or is getting worse, especially if you are pregnant or have a medical condition.  Swelling in only one leg.  Increased swelling, redness, or pain in one or both of your legs.  Drainage or sores at the area where you have edema. Get help right away if you:  Develop shortness of breath, especially when you are lying down.  Have pain in your chest or abdomen.  Feel weak.  Feel faint. Summary  Peripheral edema is swelling that is caused by a buildup of fluid. Peripheral edema most often affects the lower legs, ankles, and feet.  Move around often to prevent stiffness and to lessen swelling. Do not sit or stand for long periods of time.  Pay attention to any changes in your symptoms.  Contact a health care provider if you have edema that starts suddenly or is getting worse, especially if you are pregnant or have a medical condition.  Get help right away if you develop shortness of breath, especially when lying down. This information is not intended to replace advice given to you by your health care provider. Make sure you discuss any questions you have with your health care provider. Document Revised: 05/07/2018 Document Reviewed: 05/07/2018 Elsevier Patient Education  [place].

## 2020-02-01 ENCOUNTER — Other Ambulatory Visit: Payer: Self-pay | Admitting: Family Medicine

## 2020-02-01 DIAGNOSIS — T148XXA Other injury of unspecified body region, initial encounter: Secondary | ICD-10-CM

## 2020-02-01 DIAGNOSIS — M25562 Pain in left knee: Secondary | ICD-10-CM

## 2020-02-01 NOTE — Telephone Encounter (Signed)
Pt LOV was on 01/22/2020 and last refill was done on 12/29/2019 for 30 tablets, ok to refill

## 2020-02-04 ENCOUNTER — Other Ambulatory Visit: Payer: Self-pay

## 2020-02-05 ENCOUNTER — Ambulatory Visit (INDEPENDENT_AMBULATORY_CARE_PROVIDER_SITE_OTHER): Payer: PPO | Admitting: Family Medicine

## 2020-02-05 ENCOUNTER — Encounter: Payer: Self-pay | Admitting: Family Medicine

## 2020-02-05 DIAGNOSIS — D649 Anemia, unspecified: Secondary | ICD-10-CM | POA: Diagnosis not present

## 2020-02-05 DIAGNOSIS — R600 Localized edema: Secondary | ICD-10-CM | POA: Insufficient documentation

## 2020-02-05 MED ORDER — FERROUS GLUCONATE 324 (38 FE) MG PO TABS: 324.0000 mg | ORAL_TABLET | ORAL | 3 refills | Status: DC

## 2020-02-05 NOTE — Patient Instructions (Addendum)
Anemia  Anemia is a condition in which you do not have enough red blood cells or hemoglobin. Hemoglobin is a substance in red blood cells that carries oxygen. When you do not have enough red blood cells or hemoglobin (are anemic), your body cannot get enough oxygen and your organs may not work properly. As a result, you may feel very tired or have other problems. What are the causes? Common causes of anemia include:  Excessive bleeding. Anemia can be caused by excessive bleeding inside or outside the body, including bleeding from the intestine or from periods in women.  Poor nutrition.  Long-lasting (chronic) kidney, thyroid, and liver disease.  Bone marrow disorders.  Cancer and treatments for cancer.  HIV (human immunodeficiency virus) and AIDS (acquired immunodeficiency syndrome).  Treatments for HIV and AIDS.  Spleen problems.  Blood disorders.  Infections, medicines, and autoimmune disorders that destroy red blood cells. What are the signs or symptoms? Symptoms of this condition include:  Minor weakness.  Dizziness.  Headache.  Feeling heartbeats that are irregular or faster than normal (palpitations).  Shortness of breath, especially with exercise.  Paleness.  Cold sensitivity.  Indigestion.  Nausea.  Difficulty sleeping.  Difficulty concentrating. Symptoms may occur suddenly or develop slowly. If your anemia is mild, you may not have symptoms. How is this diagnosed? This condition is diagnosed based on:  Blood tests.  Your medical history.  A physical exam.  Bone marrow biopsy. Your health care provider may also check your stool (feces) for blood and may do additional testing to look for the cause of your bleeding. You may also have other tests, including:  Imaging tests, such as a CT scan or MRI.  Endoscopy.  Colonoscopy. How is this treated? Treatment for this condition depends on the cause. If you continue to lose a lot of blood, you may  need to be treated at a hospital. Treatment may include:  Taking supplements of iron, vitamin S31, or folic acid.  Taking a hormone medicine (erythropoietin) that can help to stimulate red blood cell growth.  Having a blood transfusion. This may be needed if you lose a lot of blood.  Making changes to your diet.  Having surgery to remove your spleen. Follow these instructions at home:  Take over-the-counter and prescription medicines only as told by your health care provider.  Take supplements only as told by your health care provider.  Follow any diet instructions that you were given.  Keep all follow-up visits as told by your health care provider. This is important. Contact a health care provider if:  You develop new bleeding anywhere in the body. Get help right away if:  You are very weak.  You are short of breath.  You have pain in your abdomen or chest.  You are dizzy or feel faint.  You have trouble concentrating.  You have bloody or black, tarry stools.  You vomit repeatedly or you vomit up blood. Summary  Anemia is a condition in which you do not have enough red blood cells or enough of a substance in your red blood cells that carries oxygen (hemoglobin).  Symptoms may occur suddenly or develop slowly.  If your anemia is mild, you may not have symptoms.  This condition is diagnosed with blood tests as well as a medical history and physical exam. Other tests may be needed.  Treatment for this condition depends on the cause of the anemia. This information is not intended to replace advice given to you by  your health care provider. Make sure you discuss any questions you have with your health care provider. Document Revised: 07/26/2017 Document Reviewed: 09/14/2016 Elsevier Patient Education  [place].  Edema  Edema is an abnormal buildup of fluids in the body tissues and under the skin. Swelling of the legs, feet, and ankles is a common symptom  that becomes more likely as you get older. Swelling is also common in looser tissues, like around the eyes. When the affected area is squeezed, the fluid may move out of that spot and leave a dent for a few moments. This dent is called pitting edema. There are many possible causes of edema. Eating too much salt (sodium) and being on your feet or sitting for a long time can cause edema in your legs, feet, and ankles. Hot weather may make edema worse. Common causes of edema include:  Heart failure.  Liver or kidney disease.  Weak leg blood vessels.  Cancer.  An injury.  Pregnancy.  Medicines.  Being obese.  Low protein levels in the blood. Edema is usually painless. Your skin may look swollen or shiny. Follow these instructions at home:  Keep the affected body part raised (elevated) above the level of your heart when you are sitting or lying down.  Do not sit still or stand for long periods of time.  Do not wear tight clothing. Do not wear garters on your upper legs.  Exercise your legs to get your circulation going. This helps to move the fluid back into your blood vessels, and it may help the swelling go down.  Wear elastic bandages or support stockings to reduce swelling as told by your health care provider.  Eat a low-salt (low-sodium) diet to reduce fluid as told by your health care provider.  Depending on the cause of your swelling, you may need to limit how much fluid you drink (fluid restriction).  Take over-the-counter and prescription medicines only as told by your health care provider. Contact a health care provider if:  Your edema does not get better with treatment.  You have heart, liver, or kidney disease and have symptoms of edema.  You have sudden and unexplained weight gain. Get help right away if:  You develop shortness of breath or chest pain.  You cannot breathe when you lie down.  You develop pain, redness, or warmth in the swollen areas.  You  have heart, liver, or kidney disease and suddenly get edema.  You have a fever and your symptoms suddenly get worse. Summary  Edema is an abnormal buildup of fluids in the body tissues and under the skin.  Eating too much salt (sodium) and being on your feet or sitting for a long time can cause edema in your legs, feet, and ankles.  Keep the affected body part raised (elevated) above the level of your heart when you are sitting or lying down. This information is not intended to replace advice given to you by your health care provider. Make sure you discuss any questions you have with your health care provider. Document Revised: 12/31/2018 Document Reviewed: 09/15/2016 Elsevier Patient Education  [place].

## 2020-02-05 NOTE — Progress Notes (Signed)
Subjective:    Patient ID: Renee Watts, female    DOB: Aug 21, 1951, 69 y.o.   MRN: [id number]  No chief complaint on file.   HPI Patient was seen today for f/u.  Pt notes improvement in b/l LE edema.  Taking lasix 20 mg daily and potassium.  Pt denies hypotension, headaches, dizziness.  Pt was surprised to hear she was mildly anemic, but realized that's why she felt a little more tired.  Pt has been on iron in the past, but endorsed constipation.  Would drink prune juice and eat prunes daily to help prevent this.  Past Medical History:  Diagnosis Date  . Allergy   . Benign paroxysmal positional vertigo 06/25/2013   dx with prior PCP, s/p labs, mri and vestibular rehab; nasal spray for etd seemed to help  . Blood in stool 03/11/2012  . Dependent edema    Bilateral  . Depression   . GERD (gastroesophageal reflux disease)   . Hyperlipidemia   . Low back pain    sciatica; sees orthopedic specialist ([facility])for this and shoulder tendinitis  . Obesity, diabetes, and hypertension syndrome ([place])   . Osteoarthritis, knee    Bilateral, s/p 3 total knee replacement surgerues on right, last 2005. Dr. Tonita Cong  . Pellegrini-Steida syndrome    Myositis ossificans  . Seasonal allergies   . Shingles Jul 10 2012   LESIONS CLEARED BUT NOT TOLERATING ANY THING TIGHT AGAINST BODY--SHINGLES WERE AROSS BACK AND ABDOMEN    No Known Allergies  ROS General: Denies fever, chills, night sweats, changes in weight, changes in appetite  +tired HEENT: Denies headaches, ear pain, changes in vision, rhinorrhea, sore throat CV: Denies CP, palpitations, SOB, orthopnea Pulm: Denies SOB, cough, wheezing GI: Denies abdominal pain, nausea, vomiting, diarrhea, constipation GU: Denies dysuria, hematuria, frequency, vaginal discharge Msk: Denies muscle cramps, joint pains Neuro: Denies weakness, numbness, tingling Skin: Denies rashes, bruising Psych: Denies depression, anxiety, hallucinations     Objective:    There were no vitals taken for this visit.  Weight 159 lbs, Temperature 97.55F, BP 110/64, P 62, O2 98%  Gen. Pleasant, well-nourished, in no distress, normal affect   HEENT: [place]/AT, face symmetric, no scleral icterus, PERRLA, EOMI, nares patent without drainage Lungs: no accessory muscle use Cardiovascular: RRR, no peripheral edema Musculoskeletal: No deformities, no cyanosis or clubbing, normal tone Neuro:  A&Ox3, CN II-XII intact, normal gait Skin:  Warm, no lesions/ rash  Wt Readings from Last 3 Encounters:  01/22/20 178 lb (80.7 kg)  10/28/19 177 lb (80.3 kg)  09/30/19 180 lb (81.6 kg)    Lab Results  Component Value Date   WBC 6.6 01/22/2020   HGB 11.5 (L) 01/22/2020   HCT 34.4 (L) 01/22/2020   PLT 185.0 01/22/2020   GLUCOSE 101 (H) 01/22/2020   CHOL 184 04/14/2018   TRIG 142.0 04/12/2017   HDL 42.00 04/14/2018   LDLDIRECT 137.8 10/12/2013   LDLCALC 74 04/12/2017   ALT 15 09/10/2012   AST 16 09/10/2012   NA 139 01/22/2020   K 4.0 01/22/2020   CL 102 01/22/2020   CREATININE 0.59 01/22/2020   BUN 10 01/22/2020   CO2 29 01/22/2020   TSH 1.20 05/10/2015   INR 0.94 01/05/2013   HGBA1C 6.5 01/22/2020   MICROALBUR 0.5 03/11/2014    Assessment/Plan:  Bilateral lower extremity edema -Improving -Continue Lasix 20 mg and Klor-Con 20 mEq daily -Continue elevating lower extremities, TED hose/compression hose, avoiding prolonged standing or sitting, and decreasing sodium intake. -Continue  to monitor -Follow-up.  Anemia, unspecified type  -mildly anemic, hgb 11.5 -discussed increasing intake of iron rich foods. -Consider iron supplementation qod or a few times a week - Plan: ferrous gluconate (FERGON) 324 MG tablet  F/u prn  Grier Mitts, MD

## 2020-02-18 ENCOUNTER — Encounter: Payer: Self-pay | Admitting: Family Medicine

## 2020-02-18 ENCOUNTER — Other Ambulatory Visit: Payer: Self-pay

## 2020-02-18 ENCOUNTER — Telehealth (INDEPENDENT_AMBULATORY_CARE_PROVIDER_SITE_OTHER): Payer: PPO | Admitting: Family Medicine

## 2020-02-18 VITALS — Temp 95.6°F | Wt 174.0 lb

## 2020-02-18 DIAGNOSIS — R059 Cough, unspecified: Secondary | ICD-10-CM

## 2020-02-18 DIAGNOSIS — J302 Other seasonal allergic rhinitis: Secondary | ICD-10-CM

## 2020-02-18 DIAGNOSIS — R05 Cough: Secondary | ICD-10-CM | POA: Diagnosis not present

## 2020-02-18 DIAGNOSIS — R0981 Nasal congestion: Secondary | ICD-10-CM

## 2020-02-18 MED ORDER — BENZONATATE 100 MG PO CAPS: 100.0000 mg | ORAL_CAPSULE | Freq: Three times a day (TID) | ORAL | 0 refills | Status: DC | PRN

## 2020-02-18 NOTE — Progress Notes (Signed)
Virtual Visit via Telephone Note  I connected with Illa Level on 02/18/20 at 11:00 AM EDT by telephone and verified that I am speaking with the correct person using two identifiers.   I discussed the limitations, risks, security and privacy concerns of performing an evaluation and management service by telephone and the availability of in person appointments. I also discussed with the patient that there may be a patient responsible charge related to this service. The patient expressed understanding and agreed to proceed.  Location patient: home Location provider: work or home office Participants present for the call: patient, provider Patient did not have a visit in the prior 7 days to address this/these issue(s).   History of Present Illness:   Acute visit for sinus congestion: -started about 3 days ago -symptoms including clear nasal congestion, cough, sinus pain and pressure, sneezing -denies fever, sob, loss of taste, NVD, body aches -denies sick contacts or exposure to groups of people -she sometimes has issues with seasonal allergies this time of the year, has started allegra -fully vaccinated for COVID19   Observations/Objective: Patient sounds cheerful and well on the phone. I do not appreciate any SOB. Speech and thought processing are grossly intact. Patient reported vitals:  Assessment and Plan:  Sinus congestion  Cough  Seasonal allergies  -we discussed possible serious and likely etiologies, options for evaluation and workup, limitations of telemedicine visit vs in person visit, treatment, treatment risks and precautions. Pt prefers to treat via telemedicine empirically rather then risking or undertaking an in person visit at this moment. Query VURI vs allergies vs other. Opted for tx with Allegra, OTC 3 days course of Afrin, nasal saline, otc analgesic if needed and Tessalon Rx for cough. Discussed COVID19 unlikely given fully vaccinated, though not 100%  impossible. Patient agrees to seek prompt in person care if worsening, new symptoms arise, or if is not improving with treatment.  Follow Up Instructions:   I did not refer this patient for an OV in the next 24 hours for this/these issue(s).  I discussed the assessment and treatment plan with the patient. The patient was provided an opportunity to ask questions and all were answered. The patient agreed with the plan and demonstrated an understanding of the instructions.   The patient was advised to call back or seek an in-person evaluation if the symptoms worsen or if the condition fails to improve as anticipated.  I provided 13 minutes of non-face-to-face time during this encounter.   Lucretia Kern, DO

## 2020-02-24 ENCOUNTER — Other Ambulatory Visit: Payer: Self-pay | Admitting: Family Medicine

## 2020-03-08 ENCOUNTER — Ambulatory Visit (INDEPENDENT_AMBULATORY_CARE_PROVIDER_SITE_OTHER): Payer: PPO

## 2020-03-08 ENCOUNTER — Other Ambulatory Visit: Payer: Self-pay

## 2020-03-08 DIAGNOSIS — Z0000 Encounter for general adult medical examination without abnormal findings: Secondary | ICD-10-CM

## 2020-03-08 NOTE — Progress Notes (Signed)
Subjective:   Renee Watts is a 69 y.o. female who presents for Medicare Annual (Subsequent) preventive examination.  I connected with Renee Watts today by telephone and verified that I am speaking with the correct person using two identifiers. Location patient: home Location provider: work Persons participating in the virtual visit: patient, provider.   I discussed the limitations, risks, security and privacy concerns of performing an evaluation and management service by telephone and the availability of in person appointments. I also discussed with the patient that there may be a patient responsible charge related to this service. The patient expressed understanding and verbally consented to this telephonic visit.    Interactive audio and video telecommunications were attempted between this provider and patient, however failed, due to patient having technical difficulties OR patient did not have access to video capability.  We continued and completed visit with audio only.      Review of Systems    N/A Cardiac Risk Factors include: advanced age (>25mn, >>74women);diabetes mellitus;dyslipidemia;hypertension     Objective:    Today's Vitals   There is no height or weight on file to calculate BMI.  Advanced Directives 03/08/2020 04/14/2018 04/08/2018 04/12/2017 01/09/2013 01/09/2013 01/05/2013  Does Patient Have a Medical Advance Directive? No No Yes No Patient does not have advance directive;Patient would not like information - Patient does not have advance directive;Patient would not like information  Type of Advance Directive - - [place] Does patient want to make changes to medical advance directive? - - No - Patient declined - - - -  Would patient like information on creating a medical advance directive? No - Patient declined - - - - - -  Pre-existing out of facility DNR order (yellow form or pink MOST form) - - - - No No No    Current  Medications (verified) Outpatient Encounter Medications as of 03/08/2020  Medication Sig   acetaminophen (TYLENOL) 650 MG CR tablet Take by mouth.   Blood Glucose Monitoring Suppl (ONETOUCH VERIO IQ SYSTEM) W/DEVICE KIT by Does not apply route.   cetirizine (ZYRTEC) 10 MG tablet Take 10 mg by mouth at bedtime.   ferrous gluconate (FERGON) 324 MG tablet Take 1 tablet (324 mg total) by mouth every other day.   fluticasone (FLONASE) 50 MCG/ACT nasal spray Place 1 spray into both nostrils daily.   furosemide (LASIX) 40 MG tablet TAKE 1/2 TABLET(20 MG) BY MOUTH DAILY   gabapentin (NEURONTIN) 300 MG capsule Take one pill (300 mg) in am and two pills (600 mg) each evening.   glucose blood test strip Has a One Touch Verio meter.  Use as instructed   ketotifen (ZADITOR) 0.025 % ophthalmic solution Place 1 drop into both eyes as needed (ALLERGIES).   lisinopril (ZESTRIL) 20 MG tablet TAKE 1 TABLET BY MOUTH EVERY DAY   metFORMIN (GLUCOPHAGE) 1000 MG tablet TAKE 1 TABLET BY MOUTH TWICE DAILY WITH A MEAL   omeprazole (PRILOSEC) 10 MG capsule Take 10 mg by mouth daily as needed.   potassium chloride SA (KLOR-CON) 20 MEQ tablet Take 1 tablet (20 mEq total) by mouth daily.   pravastatin (PRAVACHOL) 40 MG tablet TAKE 1 AND 1/2 TABLETS(60 MG) BY MOUTH DAILY   Accu-Chek Softclix Lancets lancets daily. for testing as directed   benzonatate (TESSALON) 100 MG capsule TAKE 1 CAPSULE(100 MG) BY MOUTH THREE TIMES DAILY AS NEEDED (Patient not taking: Reported on 03/08/2020)   cyclobenzaprine (FLEXERIL) 5 MG tablet  Take 1 tablet (5 mg total) by mouth at bedtime as needed for muscle spasms. (Patient not taking: Reported on 03/08/2020)   meloxicam (MOBIC) 7.5 MG tablet TAKE 1 TABLET(7.5 MG) BY MOUTH DAILY (Patient not taking: Reported on 03/08/2020)   methylPREDNISolone (MEDROL) 4 MG tablet Medrol dose pack. Take as instructed (Patient not taking: Reported on 03/08/2020)   tiZANidine (ZANAFLEX) 4 MG tablet  Take 1 tablet (4 mg total) by mouth every 6 (six) hours as needed for muscle spasms. (Patient not taking: Reported on 03/08/2020)   traMADol (ULTRAM) 50 MG tablet Take 1-2 tablets (50-100 mg total) by mouth every 6 (six) hours as needed. (Patient not taking: Reported on 03/08/2020)   [DISCONTINUED] aspirin 81 MG tablet Take 81 mg by mouth daily.   No facility-administered encounter medications on file as of 03/08/2020.    Allergies (verified) Patient has no known allergies.   History: Past Medical History:  Diagnosis Date   Allergy    Benign paroxysmal positional vertigo 06/25/2013   dx with prior PCP, s/p labs, mri and vestibular rehab; nasal spray for etd seemed to help   Blood in stool 03/11/2012   Dependent edema    Bilateral   Depression    GERD (gastroesophageal reflux disease)    Hyperlipidemia    Low back pain    sciatica; sees orthopedic specialist ([facility])for this and shoulder tendinitis   Obesity, diabetes, and hypertension syndrome ([place])    Osteoarthritis, knee    Bilateral, s/p 3 total knee replacement surgerues on right, last 2005. Dr. Tonita Cong   Pellegrini-Steida syndrome    Myositis ossificans   Seasonal allergies    Shingles Jul 10 2012   LESIONS CLEARED BUT NOT TOLERATING ANY THING TIGHT AGAINST BODY--SHINGLES WERE AROSS BACK AND ABDOMEN   Past Surgical History:  Procedure Laterality Date   ABDOMINAL HYSTERECTOMY  1984   BLADDER SURGERY  03/2017   bladder tac per pt   COLONOSCOPY     left shoulder steroid injection     POLYPECTOMY     TONSILLECTOMY  1977   TOTAL HIP ARTHROPLASTY Right 10/10/2012   Procedure: R TOTAL HIP ARTHROPLASTY ANTERIOR APPROACH;  Surgeon: Mcarthur Rossetti, MD;  Location: [facility];  Service: Orthopedics;  Laterality: Right;  Right Total Hip Arthroplasty, Anterior Approach (C-Arm)   TOTAL KNEE ARTHROPLASTY Right    with multiple revisions, 2003, 2005   TOTAL KNEE ARTHROPLASTY Left 01/09/2013   Procedure:  LEFT TOTAL KNEE ARTHROPLASTY;  Surgeon: Mcarthur Rossetti, MD;  Location: [facility];  Service: Orthopedics;  Laterality: Left;   TUBAL LIGATION  1978   WISDOM TOOTH EXTRACTION  2013,2014   Family History  Problem Relation Age of Onset   Diabetes Mother    Hypertension Mother    Hypertension Father    Cancer Father        Lung CA   Coronary artery disease Father    Colon cancer Neg Hx    Colon polyps Neg Hx    Esophageal cancer Neg Hx    Rectal cancer Neg Hx    Stomach cancer Neg Hx    Social History   Socioeconomic History   Marital status: Married    Spouse name: Not on file   Number of children: Not on file   Years of education: 12th grade   Highest education level: Not on file  Occupational History    Employer: [employer]  Tobacco Use   Smoking status: Never Smoker   Smokeless tobacco: Never Used  Substance and Sexual Activity   Alcohol use: No   Drug use: No   Sexual activity: Not on file  Other Topics Concern   Not on file  Social History Narrative   Married, 3 daughters.   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   Nicotine: never   ETOH: no   Drugs: no   Social Determinants of Radio broadcast assistant Strain: Low Risk    Difficulty of Paying Living Expenses: Not hard at all  Food Insecurity: No Food Insecurity   Worried About Charity fundraiser in the Last Year: Never true   Arboriculturist in the Last Year: Never true  Transportation Needs: No Transportation Needs   Lack of Transportation (Medical): No   Lack of Transportation (Non-Medical): No  Physical Activity: Insufficiently Active   Days of Exercise per Week: 3 days   Minutes of Exercise per Session: 30 min  Stress: No Stress Concern Present   Feeling of Stress : Not at all  Social Connections: Moderately Integrated   Frequency of Communication with Friends and Family: More than three times a week   Frequency of  Social Gatherings with Friends and Family: Three times a week   Attends Religious Services: More than 4 times per year   Active Member of Clubs or Organizations: No   Attends Archivist Meetings: Never   Marital Status: Married    Tobacco Counseling Counseling given: Not Answered   Clinical Intake:  Pre-visit preparation completed: Yes  Pain : No/denies pain     Nutritional Risks: None Diabetes: Yes CBG done?: No Did pt. bring in CBG monitor from home?: No  Patient checks her glucose 3x per week  What is the last grade level you completed in school?: 12th grade  Diabetic?Yes  Interpreter Needed?: No  Information entered by :: [place] of Daily Living In your present state of health, do you have any difficulty performing the following activities: 03/08/2020  Hearing? N  Vision? N  Walking or climbing stairs? N  Dressing or bathing? N  Doing errands, shopping? N  Preparing Food and eating ? N  Using the Toilet? N  In the past six months, have you accidently leaked urine? N  Do you have problems with loss of bowel control? N  Managing your Medications? N  Managing your Finances? N  Housekeeping or managing your Housekeeping? N  Some recent data might be hidden    Patient Care Team: Billie Ruddy, MD as PCP - General (Family Medicine) Plyler, Chauncey Reading, RD as Dietitian (Dietician) Ara Kussmaul, MD (Ophthalmology) Alden Hipp, MD as Consulting Physician (Obstetrics and Gynecology)  Indicate any recent Medical Services you may have received from other than [facility] providers in the past year (date may be approximate).     Assessment:   This is a routine wellness examination for Renee Watts.  Hearing/Vision screen  Hearing Screening   '125Hz'$  '250Hz'$  '500Hz'$  '1000Hz'$  '2000Hz'$  '3000Hz'$  '4000Hz'$  '6000Hz'$  '8000Hz'$   Right ear:           Left ear:           Vision Screening Comments: Patient gets annual eye exam   Dietary issues and exercise  activities discussed: Current Exercise Habits: Home exercise routine, Type of exercise: walking, Time (Minutes): 25, Frequency (Times/Week): 3, Weekly Exercise (Minutes/Week): 75  Goals     Blood Pressure < 140/90     DIET - INCREASE WATER INTAKE  Patient will drink 6-8 cups of water per day      Exercise 150 min/wk Moderate Activity     Get back to speed walking and balance      HEMOGLOBIN A1C < 7.0     LDL CALC < 100     Patient Stated     I will continuing walking 20-25 minutes 3 days per week      Depression Screen PHQ 2/9 Scores 03/08/2020 04/14/2018 04/12/2017 04/12/2017 08/25/2015 07/07/2013 06/25/2013  PHQ - 2 Score 0 0 0 0 1 0 0  PHQ- 9 Score 0 - - - - - -    Fall Risk Fall Risk  03/08/2020 07/17/2019 04/14/2018 04/12/2017 04/12/2017  Falls in the past year? 1 0 No No No  Comment - Emmi Telephone Survey: data to providers prior to load - - -  Number falls in past yr: 0 - - - -  Injury with Fall? 0 - - - -  Risk for fall due to : History of fall(s) - - - -  Risk for fall due to: Comment - - - - -  Follow up Falls evaluation completed;Falls prevention discussed - - - -    Any stairs in or around the home? No  If so, are there any without handrails? No  Home free of loose throw rugs in walkways, pet beds, electrical cords, etc? Yes  Adequate lighting in your home to reduce risk of falls? Yes   ASSISTIVE DEVICES UTILIZED TO PREVENT FALLS:  Life alert? No  Use of a cane, walker or w/c? Yes , uses a cane Grab bars in the bathroom? No  Shower chair or bench in shower? No  Elevated toilet seat or a handicapped toilet? Yes     Cognitive Function: MMSE - Mini Mental State Exam 04/14/2018 04/12/2017  Not completed: (No Data) (No Data)     6CIT Screen 03/08/2020  What Year? 0 points  What month? 0 points  What time? 0 points  Count back from 20 0 points  Months in reverse 0 points  Repeat phrase 0 points  Total Score 0    Immunizations Immunization History    Administered Date(s) Administered   Influenza Split 06/04/2011, 05/07/2012   Influenza Whole 07/08/2007, 05/27/2008, 07/14/2009, 07/18/2010   Influenza, High Dose Seasonal PF 05/10/2015, 05/10/2016, 08/26/2017, 06/11/2018, 05/30/2019   Influenza,inj,Quad PF,6+ Mos 05/26/2013, 06/14/2014   Influenza-Unspecified 05/27/2016   PFIZER SARS-COV-2 Vaccination 10/19/2019, 11/09/2019   Pneumococcal Conjugate-13 05/10/2015   Pneumococcal Polysaccharide-23 07/18/2010, 05/10/2016   Td 07/18/2010    TDAP status: Up to date Flu Vaccine status: Up to date Pneumococcal vaccine status: Up to date Covid-19 vaccine status: Completed vaccines  Qualifies for Shingles Vaccine? Yes   Zostavax completed No   Shingrix Completed?: No.    Education has been provided regarding the importance of this vaccine. Patient has been advised to call insurance company to determine out of pocket expense if they have not yet received this vaccine. Advised may also receive vaccine at local pharmacy or Health Dept. Verbalized acceptance and understanding.  Screening Tests Health Maintenance  Topic Date Due   LIPID PANEL  04/15/2019   INFLUENZA VACCINE  03/27/2020   OPHTHALMOLOGY EXAM  07/06/2020   TETANUS/TDAP  07/18/2020   HEMOGLOBIN A1C  07/24/2020   FOOT EXAM  01/21/2021   MAMMOGRAM  08/18/2021   COLONOSCOPY  02/16/2022   DEXA SCAN  Completed   COVID-19 Vaccine  Completed   Hepatitis C Screening  Completed  PNA vac Low Risk Adult  Completed    Health Maintenance  Health Maintenance Due  Topic Date Due   LIPID PANEL  04/15/2019    Colorectal cancer screening: Completed 02/17/2019. Repeat every 10 years Mammogram status: Completed 08/19/2019. Repeat every year Bone Density status: Completed 12/13/2015. Results reflect: Bone density results: NORMAL. Repeat every 5 years.  Lung Cancer Screening: (Low Dose CT Chest recommended if Age 43-80 years, 30 pack-year currently smoking OR have  quit w/in 15years.) does not qualify.   Lung Cancer Screening Referral: N/A  Additional Screening:  Hepatitis C Screening: does qualify; Completed: 09/09/2015   Vision Screening: Recommended annual ophthalmology exams for early detection of glaucoma and other disorders of the eye. Is the patient up to date with their annual eye exam?  Yes  Who is the provider or what is the name of the office in which the patient attends annual eye exams? Dr. Einar Gip If pt is not established with a provider, would they like to be referred to a provider to establish care? No .   Dental Screening: Recommended annual dental exams for proper oral hygiene  Community Resource Referral / Chronic Care Management: CRR required this visit?  No   CCM required this visit?  No      Plan:     I have personally reviewed and noted the following in the patients chart:    Medical and social history  Use of alcohol, tobacco or illicit drugs   Current medications and supplements  Functional ability and status  Nutritional status  Physical activity  Advanced directives  List of other physicians  Hospitalizations, surgeries, and ER visits in previous 12 months  Vitals  Screenings to include cognitive, depression, and falls  Referrals and appointments  In addition, I have reviewed and discussed with patient certain preventive protocols, quality metrics, and best practice recommendations. A written personalized care plan for preventive services as well as general preventive health recommendations were provided to patient.     Ofilia Neas, LPN   [id number]   Nurse Notes:  Patient would like to know if her Bone Density is due at this time or if she is on a 5 year screening schedule

## 2020-03-08 NOTE — Patient Instructions (Signed)
Renee Watts , Thank you for taking time to come for your Medicare Wellness Visit. I appreciate your ongoing commitment to your health goals. Please review the following plan we discussed and let me know if I can assist you in the future.   Screening recommendations/referrals: Colonoscopy: Up to date, next due 02/16/2029 Mammogram: Up to date,next due 08/18/2020 Bone Density: Currently Due, last completed 12/13/2015 Recommended yearly ophthalmology/optometry visit for glaucoma screening and checkup Recommended yearly dental visit for hygiene and checkup  Vaccinations: Influenza vaccine: Up to date, next due 03/2020 Pneumococcal vaccine: Completed series  Tdap vaccine: up to date, next due 07/18/2010 Shingles vaccine: Currently due, check with insurance on cost of vaccine     Advanced directives: Advance directive discussed with you today. Even though you declined this today please call our office should you change your mind and we can give you the proper paperwork for you to fill out.   Conditions/risks identified:  Increase water intake to at least 6-8 cups of water per day   Next appointment: No appointments scheduled at this time.    Preventive Care 70 Years and Older, Female Preventive care refers to lifestyle choices and visits with your health care provider that can promote health and wellness. What does preventive care include?  A yearly physical exam. This is also called an annual well check.  Dental exams once or twice a year.  Routine eye exams. Ask your health care provider how often you should have your eyes checked.  Personal lifestyle choices, including:  Daily care of your teeth and gums.  Regular physical activity.  Eating a healthy diet.  Avoiding tobacco and drug use.  Limiting alcohol use.  Practicing safe sex.  Taking low-dose aspirin every day.  Taking vitamin and mineral supplements as recommended by your health care provider. What happens  during an annual well check? The services and screenings done by your health care provider during your annual well check will depend on your age, overall health, lifestyle risk factors, and family history of disease. Counseling  Your health care provider may ask you questions about your:  Alcohol use.  Tobacco use.  Drug use.  Emotional well-being.  Home and relationship well-being.  Sexual activity.  Eating habits.  History of falls.  Memory and ability to understand (cognition).  Work and work Statistician.  Reproductive health. Screening  You may have the following tests or measurements:  Height, weight, and BMI.  Blood pressure.  Lipid and cholesterol levels. These may be checked every 5 years, or more frequently if you are over 82 years old.  Skin check.  Lung cancer screening. You may have this screening every year starting at age 70 if you have a 30-pack-year history of smoking and currently smoke or have quit within the past 70 years.  Fecal occult blood test (FOBT) of the stool. You may have this test every year starting at age 70.  Flexible sigmoidoscopy or colonoscopy. You may have a sigmoidoscopy every 5 years or a colonoscopy every 10 years starting at age 70.  Hepatitis C blood test.  Hepatitis B blood test.  Sexually transmitted disease (STD) testing.  Diabetes screening. This is done by checking your blood sugar (glucose) after you have not eaten for a while (fasting). You may have this done every 1-3 years.  Bone density scan. This is done to screen for osteoporosis. You may have this done starting at age 70.  Mammogram. This may be done every 70-2 years. Talk to  your health care provider about how often you should have regular mammograms. Talk with your health care provider about your test results, treatment options, and if necessary, the need for more tests. Vaccines  Your health care provider may recommend certain vaccines, such  as:  Influenza vaccine. This is recommended every year.  Tetanus, diphtheria, and acellular pertussis (Tdap, Td) vaccine. You may need a Td booster every 10 years.  Zoster vaccine. You may need this after age 70.  Pneumococcal 13-valent conjugate (PCV13) vaccine. One dose is recommended after age 70.  Pneumococcal polysaccharide (PPSV23) vaccine. One dose is recommended after age 70. Talk to your health care provider about which screenings and vaccines you need and how often you need them. This information is not intended to replace advice given to you by your health care provider. Make sure you discuss any questions you have with your health care provider. Document Released: 09/09/2015 Document Revised: 05/02/2016 Document Reviewed: 06/14/2015 Elsevier Interactive Patient Education  2017 [place] Prevention in the Home Falls can cause injuries. They can happen to people of all ages. There are many things you can do to make your home safe and to help prevent falls. What can I do on the outside of my home?  Regularly fix the edges of walkways and driveways and fix any cracks.  Remove anything that might make you trip as you walk through a door, such as a raised step or threshold.  Trim any bushes or trees on the path to your home.  Use bright outdoor lighting.  Clear any walking paths of anything that might make someone trip, such as rocks or tools.  Regularly check to see if handrails are loose or broken. Make sure that both sides of any steps have handrails.  Any raised decks and porches should have guardrails on the edges.  Have any leaves, snow, or ice cleared regularly.  Use sand or salt on walking paths during winter.  Clean up any spills in your garage right away. This includes oil or grease spills. What can I do in the bathroom?  Use night lights.  Install grab bars by the toilet and in the tub and shower. Do not use towel bars as grab bars.  Use  non-skid mats or decals in the tub or shower.  If you need to sit down in the shower, use a plastic, non-slip stool.  Keep the floor dry. Clean up any water that spills on the floor as soon as it happens.  Remove soap buildup in the tub or shower regularly.  Attach bath mats securely with double-sided non-slip rug tape.  Do not have throw rugs and other things on the floor that can make you trip. What can I do in the bedroom?  Use night lights.  Make sure that you have a light by your bed that is easy to reach.  Do not use any sheets or blankets that are too big for your bed. They should not hang down onto the floor.  Have a firm chair that has side arms. You can use this for support while you get dressed.  Do not have throw rugs and other things on the floor that can make you trip. What can I do in the kitchen?  Clean up any spills right away.  Avoid walking on wet floors.  Keep items that you use a lot in easy-to-reach places.  If you need to reach something above you, use a strong step stool that has  a grab bar.  Keep electrical cords out of the way.  Do not use floor polish or wax that makes floors slippery. If you must use wax, use non-skid floor wax.  Do not have throw rugs and other things on the floor that can make you trip. What can I do with my stairs?  Do not leave any items on the stairs.  Make sure that there are handrails on both sides of the stairs and use them. Fix handrails that are broken or loose. Make sure that handrails are as long as the stairways.  Check any carpeting to make sure that it is firmly attached to the stairs. Fix any carpet that is loose or worn.  Avoid having throw rugs at the top or bottom of the stairs. If you do have throw rugs, attach them to the floor with carpet tape.  Make sure that you have a light switch at the top of the stairs and the bottom of the stairs. If you do not have them, ask someone to add them for you. What  else can I do to help prevent falls?  Wear shoes that:  Do not have high heels.  Have rubber bottoms.  Are comfortable and fit you well.  Are closed at the toe. Do not wear sandals.  If you use a stepladder:  Make sure that it is fully opened. Do not climb a closed stepladder.  Make sure that both sides of the stepladder are locked into place.  Ask someone to hold it for you, if possible.  Clearly mark and make sure that you can see:  Any grab bars or handrails.  First and last steps.  Where the edge of each step is.  Use tools that help you move around (mobility aids) if they are needed. These include:  Canes.  Walkers.  Scooters.  Crutches.  Turn on the lights when you go into a dark area. Replace any light bulbs as soon as they burn out.  Set up your furniture so you have a clear path. Avoid moving your furniture around.  If any of your floors are uneven, fix them.  If there are any pets around you, be aware of where they are.  Review your medicines with your doctor. Some medicines can make you feel dizzy. This can increase your chance of falling. Ask your doctor what other things that you can do to help prevent falls. This information is not intended to replace advice given to you by your health care provider. Make sure you discuss any questions you have with your health care provider. Document Released: 06/09/2009 Document Revised: 01/19/2016 Document Reviewed: 09/17/2014 Elsevier Interactive Patient Education  2017 [employer].

## 2020-03-18 ENCOUNTER — Other Ambulatory Visit: Payer: Self-pay | Admitting: Family Medicine

## 2020-04-18 ENCOUNTER — Other Ambulatory Visit: Payer: Self-pay | Admitting: Family Medicine

## 2020-04-18 DIAGNOSIS — R600 Localized edema: Secondary | ICD-10-CM

## 2020-06-03 ENCOUNTER — Ambulatory Visit: Payer: PPO | Attending: Internal Medicine

## 2020-06-03 ENCOUNTER — Ambulatory Visit: Payer: Self-pay

## 2020-06-03 DIAGNOSIS — Z23 Encounter for immunization: Secondary | ICD-10-CM

## 2020-06-03 NOTE — Progress Notes (Signed)
   Covid-19 Vaccination Clinic  Name:  ANASTYN AYARS    MRN: [id number] DOB: 1951-01-25  06/03/2020  Ms. Renwick was observed post Covid-19 immunization for 15 minutes without incident. She was provided with Vaccine Information Sheet and instruction to access the V-Safe system.   Ms. Belford was instructed to call 911 with any severe reactions post vaccine: Marland Kitchen Difficulty breathing  . Swelling of face and throat  . A fast heartbeat  . A bad rash all over body  . Dizziness and weakness

## 2020-06-06 ENCOUNTER — Ambulatory Visit: Payer: PPO | Admitting: Physician Assistant

## 2020-06-06 ENCOUNTER — Encounter: Payer: Self-pay | Admitting: Physician Assistant

## 2020-06-06 ENCOUNTER — Ambulatory Visit: Payer: Self-pay

## 2020-06-06 DIAGNOSIS — M25562 Pain in left knee: Secondary | ICD-10-CM

## 2020-06-06 DIAGNOSIS — M79605 Pain in left leg: Secondary | ICD-10-CM | POA: Diagnosis not present

## 2020-06-06 DIAGNOSIS — Z96652 Presence of left artificial knee joint: Secondary | ICD-10-CM

## 2020-06-06 MED ORDER — CYCLOBENZAPRINE HCL 5 MG PO TABS: 5.0000 mg | ORAL_TABLET | Freq: Every evening | ORAL | 0 refills | Status: DC | PRN

## 2020-06-06 MED ORDER — TRAMADOL HCL 50 MG PO TABS: 50.0000 mg | ORAL_TABLET | Freq: Four times a day (QID) | ORAL | 0 refills | Status: DC | PRN

## 2020-06-06 MED ORDER — DIAZEPAM 5 MG PO TABS: ORAL_TABLET | ORAL | 0 refills | Status: DC

## 2020-06-06 NOTE — Progress Notes (Signed)
Office Visit Note   Patient: Renee Watts           Date of Birth: 28-Jun-1951           MRN: [id number] Visit Date: 06/06/2020              Requested by: Billie Ruddy, MD [place],  [place] [id number] PCP: Billie Ruddy, MD   Assessment & Plan: Visit Diagnoses:  1. Pain in left leg   2. History of total knee replacement, left   3. Acute pain of left knee     Plan: Pain in the left leg seems more radicular in nature and due to her radiographic findings with spondylolisthesis L3-L4 would recommend MRI to rule out HNP as the source of her left leg pain.  We will see her back after the MRI to go over results and discuss further treatment.  Questions were encouraged and answered at length.  She is given tramadol for pain Flexeril also to help with her pain and Valium due to the fact that she is claustrophobic to take at the time of the MRI study.  Follow-Up Instructions: Return After MRI.   Orders:  Orders Placed This Encounter  Procedures  . XR Knee 1-2 Views Left   Meds ordered this encounter  Medications  . traMADol (ULTRAM) 50 MG tablet    Sig: Take 1-2 tablets (50-100 mg total) by mouth every 6 (six) hours as needed.    Dispense:  30 tablet    Refill:  0  . cyclobenzaprine (FLEXERIL) 5 MG tablet    Sig: Take 1 tablet (5 mg total) by mouth at bedtime as needed for muscle spasms.    Dispense:  30 tablet    Refill:  0  . diazepam (VALIUM) 5 MG tablet    Sig: TAKE ONE TAB ONE HOUR PRIOR TO MRI REPEAT AS NEEDED #2 . ZERO REFILLS    Dispense:  2 tablet    Refill:  0      Procedures: No procedures performed   Clinical Data: No additional findings.   Subjective: Chief Complaint  Patient presents with  . Left Knee - Pain    HPI Patient is 69 year old female well-known to Dr. Ninfa Linden service comes in today with bilateral leg pain mostly left knee.  She states "legs feel weak at times.  She notes that she has pain that radiates from the  left knee down to the ankle sometimes into her foot and also up to her left hip.  States pain is similar to what she had earlier this year when she was given a Medrol Dosepak and this helped with her pain.  She did well until about a month ago and the pain began returning in the left leg.  She states is gotten worse over the last few weeks.  Describes a constant dull throbbing pain in the left leg.  She does have some minimal back pain.  States that the pain down the leg does awaken her.  She denies any bowel bladder dysfunction or saddle anesthesia like symptoms.  She states she cannot lay down for long period of time due to the pain in the left leg. Review of Systems See HPI otherwise negative or noncontributory.  Objective: Vital Signs: There were no vitals taken for this visit.  Physical Exam Constitutional:      Appearance: She is not ill-appearing or diaphoretic.  Cardiovascular:     Pulses: Normal pulses.  Pulmonary:  Effort: Pulmonary effort is normal.  Neurological:     Mental Status: She is alert and oriented to person, place, and time.  Psychiatric:        Mood and Affect: Mood normal.     Ortho Exam Bilateral knees: Well-healed surgical incisions from total knee arthroplasties.  No abnormal warmth erythema or effusion of either knee.  No instability valgus varus stressing of either knee.  Tenderness over the pes anserinus area of both knees. Lower extremities 5 out of 5 strength throughout lower extremities against resistance.  Negative straight leg raise bilaterally.  Nontender over the lumbar spinal column and paraspinous region of the lower lumbar spine. Specialty Comments:  No specialty comments available.  Imaging: XR Knee 1-2 Views Left  Result Date: 06/06/2020 Left knee 2 views: Status post left total knee arthroplasty with well-seated components.  Knee is well located.  No bony abnormalities or acute fractures.    PMFS History: Patient Active Problem List    Diagnosis Date Noted  . Bilateral lower extremity edema 02/05/2020  . Recurrent sinusitis 09/03/2018  . Vaginal vault prolapse after hysterectomy 02/13/2017  . Obesity 01/06/2016  . Low back pain 01/06/2016  . Diabetic peripheral neuropathy ([place]) 09/01/2012  . Hyperlipemia 09/16/2006  . Essential hypertension 09/16/2006  . GERD 09/16/2006  . Type II diabetes mellitus with neurological manifestations ([place]) 06/17/2006  . Osteoarthritis 06/17/2006  . DEPENDENT EDEMA, LEGS, BILATERAL 06/17/2006   Past Medical History:  Diagnosis Date  . Allergy   . Benign paroxysmal positional vertigo 06/25/2013   dx with prior PCP, s/p labs, mri and vestibular rehab; nasal spray for etd seemed to help  . Blood in stool 03/11/2012  . Dependent edema    Bilateral  . Depression   . GERD (gastroesophageal reflux disease)   . Hyperlipidemia   . Low back pain    sciatica; sees orthopedic specialist ([facility])for this and shoulder tendinitis  . Obesity, diabetes, and hypertension syndrome ([place])   . Osteoarthritis, knee    Bilateral, s/p 3 total knee replacement surgerues on right, last 2005. Dr. Tonita Cong  . Pellegrini-Steida syndrome    Myositis ossificans  . Seasonal allergies   . Shingles Jul 10 2012   LESIONS CLEARED BUT NOT TOLERATING ANY THING TIGHT AGAINST BODY--SHINGLES WERE AROSS BACK AND ABDOMEN    Family History  Problem Relation Age of Onset  . Diabetes Mother   . Hypertension Mother   . Hypertension Father   . Cancer Father        Lung CA  . Coronary artery disease Father   . Colon cancer Neg Hx   . Colon polyps Neg Hx   . Esophageal cancer Neg Hx   . Rectal cancer Neg Hx   . Stomach cancer Neg Hx     Past Surgical History:  Procedure Laterality Date  . ABDOMINAL HYSTERECTOMY  1984  . BLADDER SURGERY  03/2017   bladder tac per pt  . COLONOSCOPY    . left shoulder steroid injection    . POLYPECTOMY    . TONSILLECTOMY  1977  . TOTAL HIP ARTHROPLASTY Right 10/10/2012    Procedure: R TOTAL HIP ARTHROPLASTY ANTERIOR APPROACH;  Surgeon: Mcarthur Rossetti, MD;  Location: [facility];  Service: Orthopedics;  Laterality: Right;  Right Total Hip Arthroplasty, Anterior Approach (C-Arm)  . TOTAL KNEE ARTHROPLASTY Right    with multiple revisions, 2003, 2005  . TOTAL KNEE ARTHROPLASTY Left 01/09/2013   Procedure: LEFT TOTAL KNEE ARTHROPLASTY;  Surgeon: Lind Guest  Ninfa Linden, MD;  Location: [facility];  Service: Orthopedics;  Laterality: Left;  . TUBAL LIGATION  1978  . WISDOM TOOTH EXTRACTION  2013,2014   Social History   Occupational History    Employer: [employer]  Tobacco Use  . Smoking status: Never Smoker  . Smokeless tobacco: Never Used  Substance and Sexual Activity  . Alcohol use: No  . Drug use: No  . Sexual activity: Not on file

## 2020-06-07 ENCOUNTER — Other Ambulatory Visit: Payer: Self-pay | Admitting: Radiology

## 2020-06-07 DIAGNOSIS — M4807 Spinal stenosis, lumbosacral region: Secondary | ICD-10-CM

## 2020-06-26 ENCOUNTER — Ambulatory Visit
Admission: RE | Admit: 2020-06-26 | Discharge: 2020-06-26 | Disposition: A | Discharge status: Home / Self Care | Payer: PPO | Source: Ambulatory Visit | Attending: Physician Assistant | Admitting: Physician Assistant

## 2020-06-26 ENCOUNTER — Other Ambulatory Visit: Payer: Self-pay

## 2020-06-26 DIAGNOSIS — M545 Low back pain, unspecified: Secondary | ICD-10-CM | POA: Diagnosis not present

## 2020-06-26 DIAGNOSIS — M48061 Spinal stenosis, lumbar region without neurogenic claudication: Secondary | ICD-10-CM | POA: Diagnosis not present

## 2020-06-26 DIAGNOSIS — M4807 Spinal stenosis, lumbosacral region: Secondary | ICD-10-CM

## 2020-06-27 ENCOUNTER — Encounter: Payer: Self-pay | Admitting: Family Medicine

## 2020-06-27 ENCOUNTER — Ambulatory Visit (INDEPENDENT_AMBULATORY_CARE_PROVIDER_SITE_OTHER): Payer: PPO | Admitting: Family Medicine

## 2020-06-27 VITALS — BP 118/80 | HR 73 | Temp 98.6°F | Wt 175.0 lb

## 2020-06-27 DIAGNOSIS — E1149 Type 2 diabetes mellitus with other diabetic neurological complication: Secondary | ICD-10-CM

## 2020-06-27 DIAGNOSIS — I878 Other specified disorders of veins: Secondary | ICD-10-CM | POA: Diagnosis not present

## 2020-06-27 DIAGNOSIS — R600 Localized edema: Secondary | ICD-10-CM | POA: Diagnosis not present

## 2020-06-27 DIAGNOSIS — H66002 Acute suppurative otitis media without spontaneous rupture of ear drum, left ear: Secondary | ICD-10-CM | POA: Diagnosis not present

## 2020-06-27 DIAGNOSIS — I10 Essential (primary) hypertension: Secondary | ICD-10-CM

## 2020-06-27 DIAGNOSIS — D649 Anemia, unspecified: Secondary | ICD-10-CM

## 2020-06-27 DIAGNOSIS — Z23 Encounter for immunization: Secondary | ICD-10-CM | POA: Diagnosis not present

## 2020-06-27 MED ORDER — AMOXICILLIN-POT CLAVULANATE 500-125 MG PO TABS: 1.0000 | ORAL_TABLET | Freq: Two times a day (BID) | ORAL | 0 refills | Status: AC

## 2020-06-27 NOTE — Progress Notes (Signed)
Subjective:    Patient ID: Renee Watts, female    DOB: July 09, 1951, 69 y.o.   MRN: [id number]  No chief complaint on file.   HPI Patient was seen today for follow-up and acute concern.  Patient endorses left ear discomfort when palpating preauricular area x3 days.  Patient denies sore throat, fever, nausea, vomiting, chills, headaches.  Patient works as an Air traffic controller.  Patient being followed by Dr. Ninfa Linden, Ortho for ongoing LE pain.  Had and MRI of lumbar spine, awaiting results.  Patient notes right leg slightly longer than left leg s/p THR.  Pt notes improvement discomfort with wearing insoles in left shoe.  Patient also having LE edema and discoloration of bilateral ankles.  Patient notes increased edema after recent trip to [place].  Past Medical History:  Diagnosis Date  . Allergy   . Benign paroxysmal positional vertigo 06/25/2013   dx with prior PCP, s/p labs, mri and vestibular rehab; nasal spray for etd seemed to help  . Blood in stool 03/11/2012  . Dependent edema    Bilateral  . Depression   . GERD (gastroesophageal reflux disease)   . Hyperlipidemia   . Low back pain    sciatica; sees orthopedic specialist ([facility])for this and shoulder tendinitis  . Obesity, diabetes, and hypertension syndrome ([place])   . Osteoarthritis, knee    Bilateral, s/p 3 total knee replacement surgerues on right, last 2005. Dr. Tonita Cong  . Pellegrini-Steida syndrome    Myositis ossificans  . Seasonal allergies   . Shingles Jul 10 2012   LESIONS CLEARED BUT NOT TOLERATING ANY THING TIGHT AGAINST BODY--SHINGLES WERE AROSS BACK AND ABDOMEN    Not on File  ROS General: Denies fever, chills, night sweats, changes in weight, changes in appetite HEENT: Denies headaches,  changes in vision, rhinorrhea, sore throat  + ear discomfort CV: Denies CP, palpitations, SOB, orthopnea Pulm: Denies SOB, cough, wheezing GI: Denies abdominal pain, nausea, vomiting, diarrhea,  constipation GU: Denies dysuria, hematuria, frequency, vaginal discharge Msk: Denies muscle cramps, joint pains +LE pain Neuro: Denies weakness, numbness, tingling Skin: Denies rashes, bruising  + darkened skin of b/l ankles Psych: Denies depression, anxiety, hallucinations     Objective:    Blood pressure 118/80, pulse 73, temperature 98.6 F (37 C), temperature source Oral, weight 175 lb (79.4 kg), SpO2 96 %.   Gen. Pleasant, well-nourished, in no distress, normal affect   HEENT: /AT, face symmetric, conjunctiva clear, no scleral icterus, PERRLA, EOMI, nares patent without drainage, pharynx without erythema or exudate.  External ears and canals normal.  L TM full with suppurative fluid, no erythema.  R TM full.  L cervical and submandibular lymphadenopathy Lungs: no accessory muscle use, CTAB, no wheezes or rales Cardiovascular: RRR, no m/r/g, trace b/l pitting edema of LEs at ankles and mid shin Musculoskeletal: No deformities, no cyanosis or clubbing, normal tone Neuro:  A&Ox3, CN II-XII intact, normal gait Skin:  Warm, dry, intact.  Bilateral ankles and feet discolored with hyperpigmentation/erythema.   Wt Readings from Last 3 Encounters:  06/27/20 175 lb (79.4 kg)  02/18/20 174 lb (78.9 kg)  01/22/20 178 lb (80.7 kg)    Lab Results  Component Value Date   WBC 6.6 01/22/2020   HGB 11.5 (L) 01/22/2020   HCT 34.4 (L) 01/22/2020   PLT 185.0 01/22/2020   GLUCOSE 101 (H) 01/22/2020   CHOL 184 04/14/2018   TRIG 142.0 04/12/2017   HDL 42.00 04/14/2018   LDLDIRECT 137.8 10/12/2013  LDLCALC 74 04/12/2017   ALT 15 09/10/2012   AST 16 09/10/2012   NA 139 01/22/2020   K 4.0 01/22/2020   CL 102 01/22/2020   CREATININE 0.59 01/22/2020   BUN 10 01/22/2020   CO2 29 01/22/2020   TSH 1.20 05/10/2015   INR 0.94 01/05/2013   HGBA1C 6.5 01/22/2020   MICROALBUR 0.5 03/11/2014    Assessment/Plan:  Acute suppurative otitis media of left ear without spontaneous rupture of  tympanic membrane, recurrence not specified  -Continue supportive care -We will start Augmentin 500-125 mg - Plan: CBC with Differential/Platelet, amoxicillin-clavulanate (AUGMENTIN) 500-125 MG tablet  Bilateral leg edema -likely 2/2 venous insufficiency -Discussed supportive care including wearing TED hose/compression socks, elevation, decreasing sodium intake - Plan: Brain Natriuretic Peptide, CMP with eGFR([facility])  Venous stasis of both lower extremities -Discussed supportive care -Offered referral to vascular.  Patient wishes to wait at this time. -We will continue to monitor  Essential hypertension  -Controlled -Continue lasix 20 mg daily, lisinopril 20 mg daily, and klor-con 20 mEq -Continue lifestyle modifications - Plan: CMP with eGFR([facility])  Type II diabetes mellitus with neurological manifestations ([facility])  -Last hemoglobin A1c 6.5% on 01/22/2020 -Continue current medications including Metformin 1000 mg twice daily -Continue lifestyle modification -Patient on ACE I and statin. - Plan: Hemoglobin A1c, Lipid panel  Anemia, unspecified type  -Continue with Fergon 324 mg iron supplementation -We will recheck labs.  Further recommendations based on lab results. - Plan: Iron, TIBC and Ferritin Panel, CBC with Differential/Platelet, CBC with Differential/Platelet, Iron, TIBC and Ferritin Panel  Need for influenza vaccination  - Plan: Flu Vaccine QUAD High Dose(Fluad)  F/u in 1 month, sooner if needed.  Grier Mitts, MD

## 2020-06-27 NOTE — Patient Instructions (Signed)
Otitis Media, Adult  Otitis media occurs when there is inflammation and fluid in the middle ear. Your middle ear is a part of the ear that contains bones for hearing as well as air that helps send sounds to your brain. What are the causes? This condition is caused by a blockage in the eustachian tube. This tube drains fluid from the ear to the back of the nose (nasopharynx). A blockage in this tube can be caused by an object or by swelling (edema) in the tube. Problems that can cause a blockage include:  A cold or other upper respiratory infection.  Allergies.  An irritant, such as tobacco smoke.  Enlarged adenoids. The adenoids are areas of soft tissue located high in the back of the throat, behind the nose and the roof of the mouth.  A mass in the nasopharynx.  Damage to the ear caused by pressure changes (barotrauma). What are the signs or symptoms? Symptoms of this condition include:  Ear pain.  A fever.  Decreased hearing.  A headache.  Tiredness (lethargy).  Fluid leaking from the ear.  Ringing in the ear. How is this diagnosed? This condition is diagnosed with a physical exam. During the exam your health care provider will use an instrument called an otoscope to look into your ear and check for redness, swelling, and fluid. He or she will also ask about your symptoms. Your health care provider may also order tests, such as:  A test to check the movement of the eardrum (pneumatic otoscopy). This test is done by squeezing a small amount of air into the ear.  A test that changes air pressure in the middle ear to check how well the eardrum moves and whether the eustachian tube is working (tympanogram). How is this treated? This condition usually goes away on its own within 3-5 days. But if the condition is caused by a bacteria infection and does not go away own its own, or keeps coming back, your health care provider may:  Prescribe antibiotic medicines to treat the  infection.  Prescribe or recommend medicines to control pain. Follow these instructions at home:  Take over-the-counter and prescription medicines only as told by your health care provider.  If you were prescribed an antibiotic medicine, take it as told by your health care provider. Do not stop taking the antibiotic even if you start to feel better.  Keep all follow-up visits as told by your health care provider. This is important. Contact a health care provider if:  You have bleeding from your nose.  There is a lump on your neck.  You are not getting better in 5 days.  You feel worse instead of better. Get help right away if:  You have severe pain that is not controlled with medicine.  You have swelling, redness, or pain around your ear.  You have stiffness in your neck.  A part of your face is paralyzed.  The bone behind your ear (mastoid) is tender when you touch it.  You develop a severe headache. Summary  Otitis media is redness, soreness, and swelling of the middle ear.  This condition usually goes away on its own within 3-5 days.  If the problem does not go away in 3-5 days, your health care provider may prescribe or recommend medicines to treat your symptoms.  If you were prescribed an antibiotic medicine, take it as told by your health care provider. This information is not intended to replace advice given to you by   your health care provider. Make sure you discuss any questions you have with your health care provider. Document Revised: 07/26/2017 Document Reviewed: 08/03/2016 Elsevier Patient Education  [place].  Anemia  Anemia is a condition in which you do not have enough red blood cells or hemoglobin. Hemoglobin is a substance in red blood cells that carries oxygen. When you do not have enough red blood cells or hemoglobin (are anemic), your body cannot get enough oxygen and your organs may not work properly. As a result, you may feel very tired  or have other problems. What are the causes? Common causes of anemia include:  Excessive bleeding. Anemia can be caused by excessive bleeding inside or outside the body, including bleeding from the intestine or from periods in women.  Poor nutrition.  Long-lasting (chronic) kidney, thyroid, and liver disease.  Bone marrow disorders.  Cancer and treatments for cancer.  HIV (human immunodeficiency virus) and AIDS (acquired immunodeficiency syndrome).  Treatments for HIV and AIDS.  Spleen problems.  Blood disorders.  Infections, medicines, and autoimmune disorders that destroy red blood cells. What are the signs or symptoms? Symptoms of this condition include:  Minor weakness.  Dizziness.  Headache.  Feeling heartbeats that are irregular or faster than normal (palpitations).  Shortness of breath, especially with exercise.  Paleness.  Cold sensitivity.  Indigestion.  Nausea.  Difficulty sleeping.  Difficulty concentrating. Symptoms may occur suddenly or develop slowly. If your anemia is mild, you may not have symptoms. How is this diagnosed? This condition is diagnosed based on:  Blood tests.  Your medical history.  A physical exam.  Bone marrow biopsy. Your health care provider may also check your stool (feces) for blood and may do additional testing to look for the cause of your bleeding. You may also have other tests, including:  Imaging tests, such as a CT scan or MRI.  Endoscopy.  Colonoscopy. How is this treated? Treatment for this condition depends on the cause. If you continue to lose a lot of blood, you may need to be treated at a hospital. Treatment may include:  Taking supplements of iron, vitamin U98, or folic acid.  Taking a hormone medicine (erythropoietin) that can help to stimulate red blood cell growth.  Having a blood transfusion. This may be needed if you lose a lot of blood.  Making changes to your diet.  Having surgery to  remove your spleen. Follow these instructions at home:  Take over-the-counter and prescription medicines only as told by your health care provider.  Take supplements only as told by your health care provider.  Follow any diet instructions that you were given.  Keep all follow-up visits as told by your health care provider. This is important. Contact a health care provider if:  You develop new bleeding anywhere in the body. Get help right away if:  You are very weak.  You are short of breath.  You have pain in your abdomen or chest.  You are dizzy or feel faint.  You have trouble concentrating.  You have bloody or black, tarry stools.  You vomit repeatedly or you vomit up blood. Summary  Anemia is a condition in which you do not have enough red blood cells or enough of a substance in your red blood cells that carries oxygen (hemoglobin).  Symptoms may occur suddenly or develop slowly.  If your anemia is mild, you may not have symptoms.  This condition is diagnosed with blood tests as well as a medical history  and physical exam. Other tests may be needed.  Treatment for this condition depends on the cause of the anemia. This information is not intended to replace advice given to you by your health care provider. Make sure you discuss any questions you have with your health care provider. Document Revised: 07/26/2017 Document Reviewed: 09/14/2016 Elsevier Patient Education  [place].  Chronic Venous Insufficiency Chronic venous insufficiency is a condition where the leg veins cannot effectively pump blood from the legs to the heart. This happens when the vein walls are either stretched, weakened, or damaged, or when the valves inside the vein are damaged. With the right treatment, you should be able to continue with an active life. This condition is also called venous stasis. What are the causes? Common causes of this condition include:  High blood pressure  inside the veins (venous hypertension).  Sitting or standing too long, causing increased blood pressure in the leg veins.  A blood clot that blocks blood flow in a vein (deep vein thrombosis, DVT).  Inflammation of a vein (phlebitis) that causes a blood clot to form.  Tumors in the pelvis that cause blood to back up. What increases the risk? The following factors may make you more likely to develop this condition:  Having a family history of this condition.  Obesity.  Pregnancy.  Living without enough regular physical activity or exercise (sedentary lifestyle).  Smoking.  Having a job that requires long periods of standing or sitting in one place.  Being a certain age. Women in their 43s and 66s and men in their 60s are more likely to develop this condition. What are the signs or symptoms? Symptoms of this condition include:  Veins that are enlarged, bulging, or twisted (varicose veins).  Skin breakdown or ulcers.  Reddened skin or dark discoloration of skin on the leg between the knee and ankle.  Brown, smooth, tight, and painful skin just above the ankle, usually on the inside of the leg (lipodermatosclerosis).  Swelling of the legs. How is this diagnosed? This condition may be diagnosed based on:  Your medical history.  A physical exam.  Tests, such as: ? A procedure that creates an image of a blood vessel and nearby organs and provides information about blood flow through the blood vessel (duplex ultrasound). ? A procedure that tests blood flow (plethysmography). ? A procedure that looks at the veins using X-ray and dye (venogram). How is this treated? The goals of treatment are to help you return to an active life and to minimize pain or disability. Treatment depends on the severity of your condition, and it may include:  Wearing compression stockings. These can help relieve symptoms and help prevent your condition from getting worse. However, they do not cure  the condition.  Sclerotherapy. This procedure involves an injection of a solution that shrinks damaged veins.  Surgery. This may involve: ? Removing a diseased vein (vein stripping). ? Cutting off blood flow through the vein (laser ablation surgery). ? Repairing or reconstructing a valve within the affected vein. Follow these instructions at home:      Wear compression stockings as told by your health care provider. These stockings help to prevent blood clots and reduce swelling in your legs.  Take over-the-counter and prescription medicines only as told by your health care provider.  Stay active by exercising, walking, or doing different activities. Ask your health care provider what activities are safe for you and how much exercise you need.  Drink enough  fluid to keep your urine pale yellow.  Do not use any products that contain nicotine or tobacco, such as cigarettes, e-cigarettes, and chewing tobacco. If you need help quitting, ask your health care provider.  Keep all follow-up visits as told by your health care provider. This is important. Contact a health care provider if you:  Have redness, swelling, or more pain in the affected area.  See a red streak or line that goes up or down from the affected area.  Have skin breakdown or skin loss in the affected area, even if the breakdown is small.  Get an injury in the affected area. Get help right away if:  You get an injury and an open wound in the affected area.  You have: ? Severe pain that does not get better with medicine. ? Sudden numbness or weakness in the foot or ankle below the affected area. ? Trouble moving your foot or ankle. ? A fever. ? Worse or persistent symptoms. ? Chest pain. ? Shortness of breath. Summary  Chronic venous insufficiency is a condition where the leg veins cannot effectively pump blood from the legs to the heart.  Chronic venous insufficiency occurs when the vein walls become  stretched, weakened, or damaged, or when valves within the vein are damaged.  Treatment depends on how severe your condition is. It often involves wearing compression stockings and may involve having a procedure.  Make sure you stay active by exercising, walking, or doing different activities. Ask your health care provider what activities are safe for you and how much exercise you need. This information is not intended to replace advice given to you by your health care provider. Make sure you discuss any questions you have with your health care provider. Document Revised: 05/06/2018 Document Reviewed: 05/06/2018 Elsevier Patient Education  [place].  Peripheral Edema  Peripheral edema is swelling that is caused by a buildup of fluid. Peripheral edema most often affects the lower legs, ankles, and feet. It can also develop in the arms, hands, and face. The area of the body that has peripheral edema will look swollen. It may also feel heavy or warm. Your clothes may start to feel tight. Pressing on the area may make a temporary dent in your skin. You may not be able to move your swollen arm or leg as much as usual. There are many causes of peripheral edema. It can happen because of a complication of other conditions such as congestive heart failure, kidney disease, or a problem with your blood circulation. It also can be a side effect of certain medicines or because of an infection. It often happens to women during pregnancy. Sometimes, the cause is not known. Follow these instructions at home: Managing pain, stiffness, and swelling   Raise (elevate) your legs while you are sitting or lying down.  Move around often to prevent stiffness and to lessen swelling.  Do not sit or stand for long periods of time.  Wear support stockings as told by your health care provider. Medicines  Take over-the-counter and prescription medicines only as told by your health care provider.  Your health  care provider may prescribe medicine to help your body get rid of excess water (diuretic). General instructions  Pay attention to any changes in your symptoms.  Follow instructions from your health care provider about limiting salt (sodium) in your diet. Sometimes, eating less salt may reduce swelling.  Moisturize skin daily to help prevent skin from cracking and draining.  Keep all follow-up visits as told by your health care provider. This is important. Contact a health care provider if you have:  A fever.  Edema that starts suddenly or is getting worse, especially if you are pregnant or have a medical condition.  Swelling in only one leg.  Increased swelling, redness, or pain in one or both of your legs.  Drainage or sores at the area where you have edema. Get help right away if you:  Develop shortness of breath, especially when you are lying down.  Have pain in your chest or abdomen.  Feel weak.  Feel faint. Summary  Peripheral edema is swelling that is caused by a buildup of fluid. Peripheral edema most often affects the lower legs, ankles, and feet.  Move around often to prevent stiffness and to lessen swelling. Do not sit or stand for long periods of time.  Pay attention to any changes in your symptoms.  Contact a health care provider if you have edema that starts suddenly or is getting worse, especially if you are pregnant or have a medical condition.  Get help right away if you develop shortness of breath, especially when lying down. This information is not intended to replace advice given to you by your health care provider. Make sure you discuss any questions you have with your health care provider. Document Revised: 05/07/2018 Document Reviewed: 05/07/2018 Elsevier Patient Education  [place].

## 2020-06-28 LAB — IRON,TIBC AND FERRITIN PANEL
%SAT: 20 % (calc) (ref 16–45)
Ferritin: 72 ng/mL (ref 16–288)
Iron: 76 ug/dL (ref 45–160)
TIBC: 371 mcg/dL (calc) (ref 250–450)

## 2020-06-28 LAB — CBC WITH DIFFERENTIAL/PLATELET
Absolute Monocytes: 473 cells/uL (ref 200–950)
Basophils Absolute: 50 cells/uL (ref 0–200)
Basophils Relative: 0.9 %
Eosinophils Absolute: 512 cells/uL — ABNORMAL HIGH (ref 15–500)
Eosinophils Relative: 9.3 %
HCT: 38.3 % (ref 35.0–45.0)
Hemoglobin: 12.7 g/dL (ref 11.7–15.5)
Lymphs Abs: 1876 cells/uL (ref 850–3900)
MCH: 30.8 pg (ref 27.0–33.0)
MCHC: 33.2 g/dL (ref 32.0–36.0)
MCV: 92.7 fL (ref 80.0–100.0)
MPV: 11 fL (ref 7.5–12.5)
Monocytes Relative: 8.6 %
Neutro Abs: 2591 cells/uL (ref 1500–7800)
Neutrophils Relative %: 47.1 %
Platelets: 148 10*3/uL (ref 140–400)
RBC: 4.13 10*6/uL (ref 3.80–5.10)
RDW: 13.2 % (ref 11.0–15.0)
Total Lymphocyte: 34.1 %
WBC: 5.5 10*3/uL (ref 3.8–10.8)

## 2020-06-28 LAB — COMPLETE METABOLIC PANEL WITH GFR
AG Ratio: 1.1 (calc) (ref 1.0–2.5)
ALT: 27 U/L (ref 6–29)
AST: 21 U/L (ref 10–35)
Albumin: 4.3 g/dL (ref 3.6–5.1)
Alkaline phosphatase (APISO): 56 U/L (ref 37–153)
BUN/Creatinine Ratio: 45 (calc) — ABNORMAL HIGH (ref 6–22)
BUN: 19 mg/dL (ref 7–25)
CO2: 29 mmol/L (ref 20–32)
Calcium: 9.9 mg/dL (ref 8.6–10.4)
Chloride: 105 mmol/L (ref 98–110)
Creat: 0.42 mg/dL — ABNORMAL LOW (ref 0.60–0.93)
GFR, Est African American: 120 mL/min/{1.73_m2} (ref >=60)
GFR, Est Non African American: 104 mL/min/{1.73_m2} (ref >=60)
Globulin: 3.8 g/dL (calc) — ABNORMAL HIGH (ref 1.9–3.7)
Glucose, Bld: 181 mg/dL — ABNORMAL HIGH (ref 65–99)
Potassium: 4.4 mmol/L (ref 3.5–5.3)
Sodium: 140 mmol/L (ref 135–146)
Total Bilirubin: 0.4 mg/dL (ref 0.2–1.2)
Total Protein: 8.1 g/dL (ref 6.1–8.1)

## 2020-06-28 LAB — LIPID PANEL
Cholesterol: 205 mg/dL — ABNORMAL HIGH (ref <200)
HDL: 39 mg/dL — ABNORMAL LOW (ref >=50)
LDL Cholesterol (Calc): 140 mg/dL (calc) — ABNORMAL HIGH
Non-HDL Cholesterol (Calc): 166 mg/dL (calc) — ABNORMAL HIGH (ref <130)
Total CHOL/HDL Ratio: 5.3 (calc) — ABNORMAL HIGH (ref <5.0)
Triglycerides: 132 mg/dL (ref <150)

## 2020-06-28 LAB — HEMOGLOBIN A1C
Hgb A1c MFr Bld: 6.9 % of total Hgb — ABNORMAL HIGH (ref <5.7)
Mean Plasma Glucose: 151 (calc)
eAG (mmol/L): 8.4 (calc)

## 2020-06-28 LAB — BRAIN NATRIURETIC PEPTIDE: Brain Natriuretic Peptide: 11 pg/mL (ref <100)

## 2020-06-29 ENCOUNTER — Encounter: Payer: Self-pay | Admitting: Physician Assistant

## 2020-06-29 ENCOUNTER — Ambulatory Visit: Payer: PPO | Admitting: Physician Assistant

## 2020-06-29 DIAGNOSIS — M4807 Spinal stenosis, lumbosacral region: Secondary | ICD-10-CM

## 2020-06-29 NOTE — Progress Notes (Addendum)
HPI: Renee Watts returns today to go over the MRI of her lumbar spine.  She states that the pain down her left leg is not as bad as it was but still present.  Again her pain does radiate down to the left ankle at times but is mostly in the left thigh region. MRI of the lumbar spine is reviewed with the patient both images and a lumbar spine model are used for demonstration of pathology.  MRI shows a 4 mm anterolisthesis at L3-4.  Also at this level she has moderate to advanced bilateral facet hypertrophy resulting in moderate left foraminal narrowing.  Broad-based disc bulge is seen at L3-4 which results in moderate to severe canal stenosis.  L4-5 central disc protrusion.  Moderate left with mild right facet hypertrophy.  This results in mild to moderate narrowing lateral recesses left greater than right. L5-S1 subarticular disc protrusion which may affect the right S1 nerve root.  Impression: Lumbar spinal stenosis Lumbar left radiculopathy  Plan: At this point time recommend epidural steroid injection lumbar spine.  We will work on setting this up with Dr. Ernestina Patches in the near future.  Have the patient follow-up with [place] 4 weeks after the epidural steroid injection to see what type of response she had.

## 2020-06-30 NOTE — Addendum Note (Signed)
Addended by: Robyne Peers on: 06/30/2020 01:28 PM   Modules accepted: Orders

## 2020-07-01 ENCOUNTER — Other Ambulatory Visit: Payer: Self-pay | Admitting: Physician Assistant

## 2020-07-01 ENCOUNTER — Telehealth: Payer: Self-pay

## 2020-07-01 DIAGNOSIS — M25562 Pain in left knee: Secondary | ICD-10-CM

## 2020-07-01 NOTE — Telephone Encounter (Signed)
Pt would like a refill of her tramadol and muscle relaxers as she won't be able to get in with Dr.Newton until 07/28/20. Pt would like to be notified when this has been called in

## 2020-07-01 NOTE — Telephone Encounter (Signed)
Pt has requested Valium for her appt on 07/28/20.

## 2020-07-04 ENCOUNTER — Other Ambulatory Visit: Payer: Self-pay | Admitting: Physician Assistant

## 2020-07-04 ENCOUNTER — Telehealth: Payer: Self-pay

## 2020-07-04 DIAGNOSIS — M25562 Pain in left knee: Secondary | ICD-10-CM

## 2020-07-04 MED ORDER — CYCLOBENZAPRINE HCL 5 MG PO TABS: 5.0000 mg | ORAL_TABLET | Freq: Every evening | ORAL | 0 refills | Status: DC | PRN

## 2020-07-04 MED ORDER — TRAMADOL HCL 50 MG PO TABS: 50.0000 mg | ORAL_TABLET | Freq: Four times a day (QID) | ORAL | 0 refills | Status: DC | PRN

## 2020-07-04 NOTE — Telephone Encounter (Signed)
Can you let her know this was sent in

## 2020-07-04 NOTE — Telephone Encounter (Signed)
I called patient and advised. 

## 2020-07-04 NOTE — Telephone Encounter (Signed)
I have sent these to meds to [facility] for approval but he has not responded

## 2020-07-04 NOTE — Telephone Encounter (Signed)
Patient called in for refill on tramdol and flexril says pharmacy told her she has no more refills and need to contact us

## 2020-07-05 ENCOUNTER — Other Ambulatory Visit: Payer: Self-pay | Admitting: Physical Medicine and Rehabilitation

## 2020-07-05 DIAGNOSIS — F411 Generalized anxiety disorder: Secondary | ICD-10-CM

## 2020-07-05 MED ORDER — DIAZEPAM 5 MG PO TABS: ORAL_TABLET | ORAL | 0 refills | Status: DC

## 2020-07-05 NOTE — Progress Notes (Signed)
Pre-procedure diazepam ordered for pre-operative anxiety.  

## 2020-07-05 NOTE — Telephone Encounter (Signed)
done

## 2020-07-15 ENCOUNTER — Other Ambulatory Visit: Payer: Self-pay | Admitting: Family Medicine

## 2020-07-15 DIAGNOSIS — R600 Localized edema: Secondary | ICD-10-CM

## 2020-07-28 ENCOUNTER — Ambulatory Visit: Payer: Self-pay

## 2020-07-28 ENCOUNTER — Encounter: Payer: Self-pay | Admitting: Physical Medicine and Rehabilitation

## 2020-07-28 ENCOUNTER — Ambulatory Visit: Payer: PPO | Admitting: Physical Medicine and Rehabilitation

## 2020-07-28 ENCOUNTER — Other Ambulatory Visit: Payer: Self-pay

## 2020-07-28 VITALS — BP 129/67 | HR 72

## 2020-07-28 DIAGNOSIS — M5416 Radiculopathy, lumbar region: Secondary | ICD-10-CM

## 2020-07-28 MED ORDER — DEXAMETHASONE SODIUM PHOSPHATE 10 MG/ML IJ SOLN
15.0000 mg | Freq: Once | INTRAMUSCULAR | Status: AC
  Administered 2020-07-28: 15 mg

## 2020-07-28 NOTE — Procedures (Signed)
Lumbosacral Transforaminal Epidural Steroid Injection - Sub-Pedicular Approach with Fluoroscopic Guidance  Patient: Renee Watts      Date of Birth: 10-Sep-1950 MRN: [id number] PCP: Billie Ruddy, MD      Visit Date: 07/28/2020   Universal Protocol:    Date/Time: 07/28/2020  Consent Given By: the patient  Position: PRONE  Additional Comments: Vital signs were monitored before and after the procedure. Patient was prepped and draped in the usual sterile fashion. The correct patient, procedure, and site was verified.   Injection Procedure Details:   Procedure diagnoses: Lumbar radiculopathy [M54.16]    Meds Administered:  Meds ordered this encounter  Medications  . dexamethasone (DECADRON) injection 15 mg    Laterality: Bilateral  Location/Site:  L3-L4  Needle:5.0 in., 22 ga.  Short bevel or Quincke spinal needle  Needle Placement: Transforaminal  Findings:    -Comments: Excellent flow of contrast along the nerve, nerve root and into the epidural space.  Procedure Details: After squaring off the end-plates to get a true AP view, the C-arm was positioned so that an oblique view of the foramen as noted above was visualized. The target area is just inferior to the "nose of the scotty dog" or sub pedicular. The soft tissues overlying this structure were infiltrated with 2-3 ml. of 1% Lidocaine without Epinephrine.  The spinal needle was inserted toward the target using a "trajectory" view along the fluoroscope beam.  Under AP and lateral visualization, the needle was advanced so it did not puncture dura and was located close the 6 O'Clock position of the pedical in AP tracterory. Biplanar projections were used to confirm position. Aspiration was confirmed to be negative for CSF and/or blood. A 1-2 ml. volume of Isovue-250 was injected and flow of contrast was noted at each level. Radiographs were obtained for documentation purposes.   After attaining the desired flow of  contrast documented above, a 0.5 to 1.0 ml test dose of 0.25% Marcaine was injected into each respective transforaminal space.  The patient was observed for 90 seconds post injection.  After no sensory deficits were reported, and normal lower extremity motor function was noted,   the above injectate was administered so that equal amounts of the injectate were placed at each foramen (level) into the transforaminal epidural space.   Additional Comments:  The patient tolerated the procedure well Dressing: 2 x 2 sterile gauze and Band-Aid    Post-procedure details: Patient was observed during the procedure. Post-procedure instructions were reviewed.  Patient left the clinic in stable condition.

## 2020-07-28 NOTE — Progress Notes (Signed)
Bilateral L3 tf esi,Pt state lower back pain that travels to both legs. Pt state sitting makes the pain worse. Pt state she takes pain meds to help ease the pain.  Numeric Pain Rating Scale and Functional Assessment Average Pain 8   In the last MONTH (on 0-10 scale) has pain interfered with the following?  1. General activity like being  able to carry out your everyday physical activities such as walking, climbing stairs, carrying groceries, or moving a chair?  Rating(10)   +Driver, -BT, -Dye Allergies.

## 2020-08-02 ENCOUNTER — Other Ambulatory Visit: Payer: Self-pay | Admitting: Family Medicine

## 2020-08-02 DIAGNOSIS — Z1231 Encounter for screening mammogram for malignant neoplasm of breast: Secondary | ICD-10-CM

## 2020-09-12 ENCOUNTER — Ambulatory Visit: Payer: PPO | Admitting: Orthopaedic Surgery

## 2020-09-14 ENCOUNTER — Ambulatory Visit
Admission: RE | Admit: 2020-09-14 | Discharge: 2020-09-14 | Disposition: A | Discharge status: Home / Self Care | Payer: HMO | Source: Ambulatory Visit | Attending: Family Medicine | Admitting: Family Medicine

## 2020-09-14 ENCOUNTER — Other Ambulatory Visit: Payer: Self-pay

## 2020-09-14 DIAGNOSIS — Z1231 Encounter for screening mammogram for malignant neoplasm of breast: Secondary | ICD-10-CM | POA: Diagnosis not present

## 2020-09-14 LAB — HM MAMMOGRAPHY

## 2020-09-15 ENCOUNTER — Telehealth: Payer: Self-pay | Admitting: Family Medicine

## 2020-09-15 ENCOUNTER — Other Ambulatory Visit: Payer: Self-pay

## 2020-09-15 ENCOUNTER — Other Ambulatory Visit: Payer: Self-pay | Admitting: Family Medicine

## 2020-09-15 DIAGNOSIS — D649 Anemia, unspecified: Secondary | ICD-10-CM

## 2020-09-15 DIAGNOSIS — E1142 Type 2 diabetes mellitus with diabetic polyneuropathy: Secondary | ICD-10-CM

## 2020-09-15 MED ORDER — FERROUS GLUCONATE 324 (38 FE) MG PO TABS: 324.0000 mg | ORAL_TABLET | ORAL | 3 refills | Status: DC

## 2020-09-15 NOTE — Telephone Encounter (Signed)
Last OV 06/27/20 Last refill 08/23/20 #270/3 Next OV 10/05/20

## 2020-09-15 NOTE — Telephone Encounter (Signed)
pt also want to know if Dr. Volanda Napoleon could order her a bone density it was recommended for her  to have one done

## 2020-09-15 NOTE — Telephone Encounter (Signed)
Ok to order Dexa Scan, pt last had one done on 11/2015

## 2020-09-15 NOTE — Telephone Encounter (Signed)
Rx for Iron sent to pt pharmacy

## 2020-09-15 NOTE — Telephone Encounter (Signed)
pt want to know if she should continue to take the ferrous iron gluconate (FERGON) 324 MG tablet if so, she needs a refill called in to  [facility], [place] AT [address] [place]  Phone:  [phone] Fax:  [phone]

## 2020-09-19 ENCOUNTER — Ambulatory Visit (INDEPENDENT_AMBULATORY_CARE_PROVIDER_SITE_OTHER): Payer: HMO | Admitting: Physician Assistant

## 2020-09-19 ENCOUNTER — Encounter: Payer: Self-pay | Admitting: Physician Assistant

## 2020-09-19 DIAGNOSIS — M79605 Pain in left leg: Secondary | ICD-10-CM

## 2020-09-19 DIAGNOSIS — M4807 Spinal stenosis, lumbosacral region: Secondary | ICD-10-CM

## 2020-09-19 NOTE — Addendum Note (Signed)
Addended by: Elvin So L on: 09/19/2020 11:11 AM   Modules accepted: Orders

## 2020-09-19 NOTE — Progress Notes (Signed)
HPI: Renee Watts returns today for low back pain and radicular symptoms down the left leg.  She underwent an epidural steroid injection with Dr. Ernestina Watts on 07/28/2020.  States that she does not have back pain any longer but is still having radicular symptoms down the left leg.  She states that the leg pain returned in the last 2 weeks.  She has had no new injury.  She has been avoiding heavy lifting twisting doing housework due to the radicular symptoms down the left leg.  Physical exam: General well-developed well-nourished female no acute distress.  She has limited forward flexion extension lumbar spine.  Negative straight leg raise bilaterally.  5 out of 5 strength throughout the lower extremities against resistance.  Impression: Lumbar left radiculopathy  Plan: Due to the fact patient continues to have radicular symptoms down the left leg despite conservative measures recommend referral to neurosurgery.  Would like this referral for her.  Should follow-up with [place] as needed.  Questions encouraged and answered

## 2020-09-21 NOTE — Telephone Encounter (Signed)
Last hgb stable.  Ok to continue ferrous sulfate qod.  Ok to place order for bone density.

## 2020-09-22 ENCOUNTER — Other Ambulatory Visit: Payer: Self-pay

## 2020-09-22 DIAGNOSIS — Z13820 Encounter for screening for osteoporosis: Secondary | ICD-10-CM

## 2020-09-22 NOTE — Telephone Encounter (Signed)
Spoke with pt aware that order for Dexa Scan was placed and she will get a call from Imaging to schedule

## 2020-09-27 ENCOUNTER — Telehealth: Payer: Self-pay | Admitting: Orthopaedic Surgery

## 2020-09-27 ENCOUNTER — Other Ambulatory Visit: Payer: Self-pay | Admitting: Physician Assistant

## 2020-09-27 DIAGNOSIS — M25562 Pain in left knee: Secondary | ICD-10-CM

## 2020-09-27 MED ORDER — TRAMADOL HCL 50 MG PO TABS: 50.0000 mg | ORAL_TABLET | Freq: Four times a day (QID) | ORAL | 0 refills | Status: DC | PRN

## 2020-09-27 MED ORDER — CYCLOBENZAPRINE HCL 5 MG PO TABS: 5.0000 mg | ORAL_TABLET | Freq: Every evening | ORAL | 1 refills | Status: DC | PRN

## 2020-09-27 NOTE — Telephone Encounter (Signed)
Please advise (I will talk to her about neuro when I call her back)

## 2020-09-27 NOTE — Telephone Encounter (Signed)
Pt called and really would love for refill the tramadol and muscle relaxer's because hs is in a lot of pain and wants to know where she was referred to for neurosurgery

## 2020-09-27 NOTE — Telephone Encounter (Signed)
Meds sent

## 2020-09-27 NOTE — Telephone Encounter (Signed)
Patient aware of the below message  

## 2020-09-30 ENCOUNTER — Encounter: Payer: Self-pay | Admitting: Family Medicine

## 2020-10-03 NOTE — Progress Notes (Signed)
Renee Watts - 70 y.o. female MRN [id number]  Date of birth: 08/29/1950  Office Visit Note: Visit Date: 07/28/2020 PCP: Billie Ruddy, MD Referred by: Billie Ruddy, MD  Subjective: Chief Complaint  Patient presents with  . Lower Back - Pain  . Left Leg - Weakness  . Right Leg - Weakness   HPI:  Renee Watts is a 70 y.o. female who comes in today at the request of Benita Stabile, PA-C for planned Bilateral L3-L4 Lumbar epidural steroid injection with fluoroscopic guidance.  The patient has failed conservative care including home exercise, medications, time and activity modification.  This injection will be diagnostic and hopefully therapeutic.  Please see requesting physician notes for further details and justification.  MRI reviewed with images and spine model.  MRI reviewed in the note below.   ROS Otherwise per HPI.  Assessment & Plan: Visit Diagnoses:    ICD-10-CM   1. Lumbar radiculopathy  M54.16 XR C-ARM NO REPORT    Epidural Steroid injection    dexamethasone (DECADRON) injection 15 mg    Plan: No additional findings.   Meds & Orders:  Meds ordered this encounter  Medications  . dexamethasone (DECADRON) injection 15 mg    Orders Placed This Encounter  Procedures  . XR C-ARM NO REPORT  . Epidural Steroid injection    Follow-up: Return if symptoms worsen or fail to improve.   Procedures: No procedures performed  Lumbosacral Transforaminal Epidural Steroid Injection - Sub-Pedicular Approach with Fluoroscopic Guidance  Patient: ZAIDY ABSHER      Date of Birth: May 26, 1951 MRN: [id number] PCP: Billie Ruddy, MD      Visit Date: 07/28/2020   Universal Protocol:    Date/Time: 07/28/2020  Consent Given By: the patient  Position: PRONE  Additional Comments: Vital signs were monitored before and after the procedure. Patient was prepped and draped in the usual sterile fashion. The correct patient, procedure, and site was  verified.   Injection Procedure Details:   Procedure diagnoses: Lumbar radiculopathy [M54.16]    Meds Administered:  Meds ordered this encounter  Medications  . dexamethasone (DECADRON) injection 15 mg    Laterality: Bilateral  Location/Site:  L3-L4  Needle:5.0 in., 22 ga.  Short bevel or Quincke spinal needle  Needle Placement: Transforaminal  Findings:    -Comments: Excellent flow of contrast along the nerve, nerve root and into the epidural space.  Procedure Details: After squaring off the end-plates to get a true AP view, the C-arm was positioned so that an oblique view of the foramen as noted above was visualized. The target area is just inferior to the "nose of the scotty dog" or sub pedicular. The soft tissues overlying this structure were infiltrated with 2-3 ml. of 1% Lidocaine without Epinephrine.  The spinal needle was inserted toward the target using a "trajectory" view along the fluoroscope beam.  Under AP and lateral visualization, the needle was advanced so it did not puncture dura and was located close the 6 O'Clock position of the pedical in AP tracterory. Biplanar projections were used to confirm position. Aspiration was confirmed to be negative for CSF and/or blood. A 1-2 ml. volume of Isovue-250 was injected and flow of contrast was noted at each level. Radiographs were obtained for documentation purposes.   After attaining the desired flow of contrast documented above, a 0.5 to 1.0 ml test dose of 0.25% Marcaine was injected into each respective transforaminal space.  The patient was observed for 90 seconds  post injection.  After no sensory deficits were reported, and normal lower extremity motor function was noted,   the above injectate was administered so that equal amounts of the injectate were placed at each foramen (level) into the transforaminal epidural space.   Additional Comments:  The patient tolerated the procedure well Dressing: 2 x 2 sterile gauze  and Band-Aid    Post-procedure details: Patient was observed during the procedure. Post-procedure instructions were reviewed.  Patient left the clinic in stable condition.      Clinical History: MRI LUMBAR SPINE WITHOUT CONTRAST  TECHNIQUE: Multiplanar, multisequence MR imaging of the lumbar spine was performed. No intravenous contrast was administered.  COMPARISON:  Prior radiograph from 09/08/2019.  FINDINGS: Segmentation: Standard. Lowest well-formed disc space labeled the L5-S1 level.  Alignment: 4 mm anterolisthesis of L3 on L4, chronic and facet mediated. Alignment otherwise normal with preservation of the normal lumbar lordosis.  Vertebrae: Vertebral body height maintained without acute or chronic fracture. Bone marrow signal intensity within normal limits. No worrisome osseous lesions. No abnormal marrow edema.  Conus medullaris and cauda equina: Conus extends to the L1 level. Conus and cauda equina appear normal.  Paraspinal and other soft tissues: Paraspinous soft tissues within normal limits. Visualized visceral structures unremarkable.  Disc levels:  T10-11: Mild disc bulge with disc desiccation. Mild facet hypertrophy. No significant stenosis.  T11-12: Mild disc bulge with disc desiccation. No significant stenosis.  T12-L1: Negative interspace.  Mild facet hypertrophy.  No stenosis.  L1-2: Negative interspace. Mild facet hypertrophy. No significant stenosis.  L2-3: Mild disc bulge with disc desiccation. Associated minimal reactive endplate change. Mild facet and ligament flavum hypertrophy. No significant spinal stenosis. Foramina remain patent.  L3-4: 4 mm anterolisthesis. Diffuse disc bulge with disc desiccation and intervertebral disc space narrowing. Associated broad-based central disc protrusion with slight superior migration of disc material. Moderate to advanced bilateral facet hypertrophy, slightly worse on the left.  Resultant moderate to severe canal with left worse than right lateral recess stenosis. Moderate left with mild right L3 foraminal narrowing.  L4-5: Degenerative intervertebral disc space narrowing with disc desiccation and diffuse disc bulge. Superimposed central disc protrusion with mild superior migration. Moderate left with mild right facet hypertrophy. Resultant mild to moderate narrowing of the lateral recesses, left slightly worse than right. Mild left L4 foraminal stenosis. No significant right foraminal encroachment.  L5-S1: Degenerative intervertebral disc space narrowing with disc desiccation and diffuse disc bulge. Superimposed shallow right subarticular disc protrusion with slight superior migration (series 6, image 38). Protruding disc closely approximates the descending right S1 nerve root without frank impingement or displacement. Mild left greater than right facet hypertrophy. No significant spinal stenosis. Mild bilateral L5 foraminal stenosis, slightly worse on the right.  IMPRESSION: 1. 4 mm anterolisthesis of L3 on L4 with associated disc bulge and facet hypertrophy, resulting in moderate to severe canal with left worse than right lateral recess stenosis, with moderate left L3 foraminal narrowing. 2. Shallow central disc protrusion and facet hypertrophy at L4-5 with resultant mild to moderate left greater than right lateral recess stenosis. 3. Shallow right subarticular disc protrusion at L5-S1, closely approximating and potentially irritating the descending right S1 nerve root.   Electronically Signed   By: Jeannine Boga M.D.   On: 06/27/2020 03:34     Objective:  VS:  HT:    WT:   BMI:     BP:129/67  HR:72bpm  TEMP: ( )  RESP:  Physical Exam Vitals and nursing note reviewed.  Constitutional:      General: She is not in acute distress.    Appearance: Normal appearance. She is not ill-appearing.  HENT:     Head: Normocephalic and  atraumatic.     Right Ear: External ear normal.     Left Ear: External ear normal.  Eyes:     Extraocular Movements: Extraocular movements intact.  Cardiovascular:     Rate and Rhythm: Normal rate.     Pulses: Normal pulses.  Pulmonary:     Effort: Pulmonary effort is normal. No respiratory distress.  Abdominal:     General: There is no distension.     Palpations: Abdomen is soft.  Musculoskeletal:        General: Tenderness present.     Cervical back: Neck supple.     Right lower leg: No edema.     Left lower leg: No edema.     Comments: Patient has good distal strength with no pain over the greater trochanters.  No clonus or focal weakness.  Skin:    Findings: No erythema, lesion or rash.  Neurological:     General: No focal deficit present.     Mental Status: She is alert and oriented to person, place, and time.     Sensory: No sensory deficit.     Motor: No weakness or abnormal muscle tone.     Coordination: Coordination normal.  Psychiatric:        Mood and Affect: Mood normal.        Behavior: Behavior normal.      Imaging: No results found.

## 2020-10-05 ENCOUNTER — Ambulatory Visit: Payer: HMO | Admitting: Family Medicine

## 2020-10-05 DIAGNOSIS — Z0289 Encounter for other administrative examinations: Secondary | ICD-10-CM

## 2020-10-10 ENCOUNTER — Telehealth: Payer: Self-pay | Admitting: Family Medicine

## 2020-10-10 DIAGNOSIS — M5416 Radiculopathy, lumbar region: Secondary | ICD-10-CM | POA: Diagnosis not present

## 2020-10-10 DIAGNOSIS — M47816 Spondylosis without myelopathy or radiculopathy, lumbar region: Secondary | ICD-10-CM | POA: Diagnosis not present

## 2020-10-10 DIAGNOSIS — I10 Essential (primary) hypertension: Secondary | ICD-10-CM | POA: Diagnosis not present

## 2020-10-10 DIAGNOSIS — M4316 Spondylolisthesis, lumbar region: Secondary | ICD-10-CM | POA: Diagnosis not present

## 2020-10-10 NOTE — Telephone Encounter (Signed)
Patient states that [facility] has been trying to get in touch with [place].  She states they need something stating they are diabetic.  Phone number to contact is [phone], [facility].

## 2020-10-11 ENCOUNTER — Telehealth: Payer: Self-pay

## 2020-10-11 NOTE — Telephone Encounter (Signed)
Spoke with [facility] agent regarding pt diagnoses code for her Diabetes diagnoses, nothing further needed

## 2020-10-11 NOTE — Telephone Encounter (Signed)
Caryl Pina is calling in from [employer] requesting a verbal diagnosis for the patient, states she has tried to fax over documents but it continues to fail.

## 2020-10-12 ENCOUNTER — Telehealth: Payer: Self-pay | Admitting: Physical Medicine and Rehabilitation

## 2020-10-12 NOTE — Telephone Encounter (Signed)
Received referral from [facility] requesting left L4 and L5 TF. Ok to schedule?

## 2020-10-12 NOTE — Telephone Encounter (Signed)
Pt form received and placed on Dr Volanda Napoleon desk for completing

## 2020-10-13 NOTE — Telephone Encounter (Signed)
Patient has been referred for PT and wants to try a few sessions before scheduling an injection. She will call us if she wants to schedule. Referral placed in folder.

## 2020-10-13 NOTE — Telephone Encounter (Signed)
Ok, need to know which Psychologist, sport and exercise

## 2020-10-18 NOTE — Telephone Encounter (Signed)
Pt form faxed to [facility], confirmation received 

## 2020-10-24 ENCOUNTER — Other Ambulatory Visit: Payer: Self-pay | Admitting: Family Medicine

## 2020-10-24 DIAGNOSIS — R600 Localized edema: Secondary | ICD-10-CM

## 2020-10-28 ENCOUNTER — Encounter: Payer: Self-pay | Admitting: Family Medicine

## 2020-10-28 ENCOUNTER — Ambulatory Visit (INDEPENDENT_AMBULATORY_CARE_PROVIDER_SITE_OTHER): Payer: HMO | Admitting: Family Medicine

## 2020-10-28 ENCOUNTER — Other Ambulatory Visit: Payer: Self-pay

## 2020-10-28 VITALS — BP 140/80 | HR 67 | Temp 98.5°F | Wt 172.0 lb

## 2020-10-28 DIAGNOSIS — R29898 Other symptoms and signs involving the musculoskeletal system: Secondary | ICD-10-CM | POA: Diagnosis not present

## 2020-10-28 DIAGNOSIS — M5126 Other intervertebral disc displacement, lumbar region: Secondary | ICD-10-CM | POA: Diagnosis not present

## 2020-10-28 DIAGNOSIS — M5136 Other intervertebral disc degeneration, lumbar region: Secondary | ICD-10-CM

## 2020-10-28 DIAGNOSIS — E1149 Type 2 diabetes mellitus with other diabetic neurological complication: Secondary | ICD-10-CM

## 2020-10-28 DIAGNOSIS — E782 Mixed hyperlipidemia: Secondary | ICD-10-CM

## 2020-10-28 MED ORDER — PRAVASTATIN SODIUM 40 MG PO TABS: ORAL_TABLET | ORAL | 3 refills | Status: DC

## 2020-10-28 NOTE — Patient Instructions (Addendum)
A refill was sent for your pravastatin.  For now hold this medication x1 week to see if the muscle weakness improves.  If you continue to have muscle weakness despite stopping the medicine you can restart it after the week.   Most of the physical therapy places open at 7 AM and closed at 5 PM during the week days.  [facility] has several offices that are open 7 am-7 pm on Monday -Thursday and Fridays 7 am-3 pm    If you are able to find timing schedule for physical therapy please contact the office so that I can place the referral.  Look into water aerobics as this would be helpful.   Herniated Disk  A herniated disk, also called a ruptured disk or slipped disk, occurs when a disk in the spine bulges out too far. Between the bones in the spine (vertebrae), there are oval disks that are made of a soft, spongy center filled with liquid that is surrounded by a tough outer ring. The disks connect the vertebrae, help the spine move, and keep the bones from rubbing against each other when you move. When you have a herniated disk, the spongy center of the disk bulges out or breaks through the outer ring. The spongy center can press on a nerve between the vertebrae and cause pain. This can occur anywhere in the back or neck area, but the lower back is most commonly affected. What are the causes? This condition may be caused by:  Age-related wear and tear.  Sudden injury, such as a strain or sprain. What increases the risk? The following factors may make you more likely to develop this condition:  Aging. This is the main risk factor for a herniated disk.  Being a man who is 52-26 years old.  Frequently doing activities that involve heavy lifting, bending, or twisting.  Not getting enough exercise.  Being overweight.  Using tobacco products. What are the signs or symptoms? Symptoms may vary depending on where your herniated disk is located.  A herniated disk in the lower  back may cause: ? Sharp pain in part of the hips, buttocks, or legs. ? Sharp pain in the lower back, spreading down through the leg into the foot (sciatica).  A herniated disk in the neck may cause dizziness and vertigo. It may also cause pain or weakness in the neck, shoulder, or upper arm, forearm, or fingers. You may also have muscle weakness. You may have trouble:  Lifting your leg or arm.  Standing on your toes.  Squeezing tightly with one of your hands. Other symptoms may include:  Numbness or tingling in the affected areas of the hands, arms, feet, or legs.  Inability to control when you urinate or when you have bowel movements. This is a rare but serious sign of a severe herniated disk in the lower back. How is this diagnosed? This condition may be diagnosed based on:  Your symptoms.  Your medical history.  A physical exam. The exam may include: ? A straight-leg test. For this test, you will lie on your back while your health care provider lifts your leg, keeping your knee straight. If you feel pain, you likely have a herniated disk. ? Neurologic tests. These include checking for numbness, reflexes, muscle strength, and problems with posture.  Imaging tests, such as: ? X-rays. ? MRI. ? CT scan. ? Electromyogram (EMG) to check the nerves that control muscles. How is this treated? Treatment for this condition may  include:  A period of light, painless activity for a few days to several weeks. Complete bed rest is not recommended. ? If you have a herniated disk in your lower back, avoid sitting as it increases pressure on the disk.  Medicines. These may include: ? NSAIDs, such as ibuprofen, to help reduce pain and swelling. ? Muscle relaxants to prevent sudden tightening of the back muscles (back spasms). ? Prescription pain medicines, if you have severe pain.  Ice or heat therapy.  Steroid injections in the area of the herniated disk. These can help reduce pain and  swelling.  Physical therapy to strengthen your back muscles. In many cases, symptoms go away with treatment over a period of days or weeks. You will most likely be free of symptoms after 3-4 months. If other treatments do not help to relieve your symptoms, you may need surgery. Follow these instructions at home: Medicines  Take over-the-counter and prescription medicines only as told by your health care provider.  Ask your health care provider if the medicine prescribed to you: ? Requires you to avoid driving or using heavy machinery. ? Can cause constipation. You may need to take these actions to prevent or treat constipation:  Drink enough fluid to keep your urine pale yellow.  Take over-the-counter or prescription medicines.  Eat foods that are high in fiber, such as beans, whole grains, and fresh fruits and vegetables.  Limit foods that are high in fat and processed sugars, such as fried or sweet foods. Managing pain, stiffness, and swelling  If directed, put ice on the painful area. Icing can help to relieve pain. To do this: ? Put ice in a plastic bag. ? Place a towel between your skin and the bag. ? Leave the ice on for 20 minutes, 2-3 times a day. ? Remove the ice if your skin turns bright red. This is very important. If you cannot feel pain, heat, or cold, you have a greater risk of damage to the area.  If directed, apply heat to the painful area as often as told by your health care provider. Heat can reduce the stiffness of your muscles. Use the heat source that your health care provider recommends, such as a moist heat pack or a heating pad. ? Place a towel between your skin and the heat source. ? Leave the heat on for 20-30 minutes. ? Remove the heat if your skin turns bright red. This is especially important if you are unable to feel pain, heat, or cold. You may have a greater risk of getting burned.      Activity  Avoid strict bed rest but only do activities that are  painless.  Gradually return to your normal activities and exercise as told by your health care provider. Ask your health care provider what activities and exercises are safe for you.  Use good posture.  Avoid movements that cause pain.  Do not lift anything that is heavier than 10 lb (4.5 kg), or the limit that you are told, until your health care provider says that it is safe.  Do not sit or stand for long periods of time without changing positions.  Do not sit for long periods of time without getting up and moving around.  If physical therapy was prescribed, do exercises as told by your health care provider.  Aim to strengthen muscles in your back and abdomen with exercises such as swimming or walking. General instructions  Do not use any products that contain  nicotine or tobacco, such as cigarettes, e-cigarettes, and chewing tobacco. These products can delay healing. If you need help quitting, ask your health care provider.  Do not wear high-heeled shoes.  Do not sleep on your abdomen.  If you are overweight, work with your health care provider to lose weight safely.  Keep all follow-up visits. This is important. How is this prevented?  Maintain a healthy weight.  Maintain physical fitness. Do at least 150 minutes of moderate-intensity exercise each week, such as brisk walking or water aerobics.  When lifting objects: ? Keep your feet at least shoulder-width apart and tighten the muscles of your abdomen. ? Keep your spine neutral as you bend your knees and hips. It is important to lift using the strength of your legs, not your back. Do not lock your knees straight out. ? Always ask for help to lift heavy or awkward objects. Contact a health care provider if you:  Have back pain or neck pain that does not get better after 6 weeks.  Have severe pain in your back, neck, legs, or arms.  Develop numbness, tingling, or weakness anywhere in your body. Get help right away  if:  You cannot move your arms or legs.  You cannot control when you urinate or have bowel movements.  You feel dizzy or you faint.  You have shortness of breath. These symptoms may represent a serious problem that is an emergency. Do not wait to see if the symptoms will go away. Get medical help right away. Call your local emergency services (911 in the [place] Do not drive yourself to the hospital. Summary  A herniated disk, also called a ruptured disk or slipped disk, occurs when a disk in the spine bulges out too far (herniates).  This condition may be caused by age-related wear and tear or a sudden injury.  Symptoms may vary depending on where your herniated disk is located.  Treatment may include rest, medicines, ice or heat therapy, steroid injections, and physical therapy.  If other treatments do not help to relieve your symptoms, you may need surgery. This information is not intended to replace advice given to you by your health care provider. Make sure you discuss any questions you have with your health care provider. Document Revised: 12/02/2019 Document Reviewed: 12/02/2019 Elsevier Patient Education  2021 [employer].

## 2020-10-28 NOTE — Progress Notes (Signed)
Subjective:    Patient ID: Renee Watts, female    DOB: Feb 14, 1951, 70 y.o.   MRN: [id number]  Chief Complaint  Patient presents with  . Follow-up    HPI Patient was seen today for follow-up.  Patient endorses L LE weakness.  Had back injections on December 2 which helped L LE pain.  Pain described as a nagging sensation in left knee and shin.  Seen by Ortho, Dr. Gordy Levan and neurosurgery, Dr. Duffy Bruce for history of bulging disc in L4-5 per MRI.  Patient tried OTC creams and gels.  Patient states gabapentin dose increased to 300 mg 4 times daily by neurology.  Patient endorses FSBS 116 this AM.  States had peach cobbler recently.  Past Medical History:  Diagnosis Date  . Allergy   . Benign paroxysmal positional vertigo 06/25/2013   dx with prior PCP, s/p labs, mri and vestibular rehab; nasal spray for etd seemed to help  . Blood in stool 03/11/2012  . Dependent edema    Bilateral  . Depression   . GERD (gastroesophageal reflux disease)   . Hyperlipidemia   . Low back pain    sciatica; sees orthopedic specialist ([facility])for this and shoulder tendinitis  . Obesity, diabetes, and hypertension syndrome ([place])   . Osteoarthritis, knee    Bilateral, s/p 3 total knee replacement surgerues on right, last 2005. Dr. Tonita Cong  . Pellegrini-Steida syndrome    Myositis ossificans  . Seasonal allergies   . Shingles Jul 10 2012   LESIONS CLEARED BUT NOT TOLERATING ANY THING TIGHT AGAINST BODY--SHINGLES WERE AROSS BACK AND ABDOMEN    Not on File  ROS General: Denies fever, chills, night sweats, changes in weight, changes in appetite HEENT: Denies headaches, ear pain, changes in vision, rhinorrhea, sore throat CV: Denies CP, palpitations, SOB, orthopnea Pulm: Denies SOB, cough, wheezing GI: Denies abdominal pain, nausea, vomiting, diarrhea, constipation GU: Denies dysuria, hematuria, frequency, vaginal discharge Msk: Denies muscle cramps, joint pains  + L LE weakness/pain, low back pain,  overall muscle weakness Neuro: Denies weakness, numbness, tingling Skin: Denies rashes, bruising Psych: Denies depression, anxiety, hallucinations     Objective:    Blood pressure 140/80, pulse 67, temperature 98.5 F (36.9 C), temperature source Oral, weight 172 lb (78 kg), SpO2 98 %.  Gen. Pleasant, well-nourished, in no distress, normal affect   HEENT: [place]/AT, face symmetric, conjunctiva clear, no scleral icterus, PERRLA, EOMI, nares patent without drainage Lungs: no accessory muscle use, CTAB, no wheezes or rales Cardiovascular: RRR, no m/r/g, no peripheral edema Musculoskeletal: No deformities, no cyanosis or clubbing, normal tone Neuro:  A&Ox3, CN II-XII intact, ambulating with a cane Skin:  Warm, no lesions/ rash   Wt Readings from Last 3 Encounters:  10/28/20 172 lb (78 kg)  06/27/20 175 lb (79.4 kg)  02/18/20 174 lb (78.9 kg)    Lab Results  Component Value Date   WBC 5.5 06/27/2020   HGB 12.7 06/27/2020   HCT 38.3 06/27/2020   PLT 148 06/27/2020   GLUCOSE 181 (H) 06/27/2020   CHOL 205 (H) 06/27/2020   TRIG 132 06/27/2020   HDL 39 (L) 06/27/2020   LDLDIRECT 137.8 10/12/2013   LDLCALC 140 (H) 06/27/2020   ALT 27 06/27/2020   AST 21 06/27/2020   NA 140 06/27/2020   K 4.4 06/27/2020   CL 105 06/27/2020   CREATININE 0.42 (L) 06/27/2020   BUN 19 06/27/2020   CO2 29 06/27/2020   TSH 1.20 05/10/2015   INR 0.94 01/05/2013  HGBA1C 6.9 (H) 06/27/2020   MICROALBUR 0.5 03/11/2014    Assessment/Plan:  Mixed hyperlipidemia -Given overall muscle weakness will hold pravastatin x1 week to see if symptoms improve.  If no improvement in symptoms restart pravastatin 60 mg daily -Continue lifestyle modifications - Plan: pravastatin (PRAVACHOL) 40 MG tablet  Type II diabetes mellitus with neurological manifestations (HCC) -Hemoglobin A1c 6.9% from 06/27/2020 -Continue current meds including Metformin 1000 mg twice daily -Continue lifestyle modifications -Continue  checking FSBS at home and keeping a log to bring to clinic -Patient on ACE I and statin  Weakness of both lower extremities -will hold Pravastatin to see if symptoms improve -Continue gabapentin -Discussed PT and water aerobics.  Patient considering but may be difficult 2/2 work schedule. -Continue follow-up with Ortho and neurosurgery  Bulging lumbar disc -Noted at L4-5 on MRI -Consider chair exercises, water aerobics, PT -Continue follow-up with neurosurgery and orthopedics  F/u as needed in the next few months  Grier Mitts, MD

## 2020-11-11 ENCOUNTER — Encounter: Payer: Self-pay | Admitting: Family Medicine

## 2020-11-15 ENCOUNTER — Telehealth: Payer: Self-pay

## 2020-11-18 NOTE — Telephone Encounter (Signed)
Okay to continue holding off on taking cholesterol medication at this time.  Consider fish oil tablets if has not previously tried to help with cholesterol.

## 2020-11-21 NOTE — Telephone Encounter (Signed)
Spoke with patient, is aware. 

## 2020-12-07 ENCOUNTER — Telehealth: Payer: Self-pay | Admitting: Family Medicine

## 2020-12-07 ENCOUNTER — Other Ambulatory Visit: Payer: Self-pay

## 2020-12-07 DIAGNOSIS — E1142 Type 2 diabetes mellitus with diabetic polyneuropathy: Secondary | ICD-10-CM

## 2020-12-07 MED ORDER — GABAPENTIN 300 MG PO CAPS: ORAL_CAPSULE | ORAL | 3 refills | Status: DC

## 2020-12-07 NOTE — Telephone Encounter (Signed)
Patient is calling and requesting a refill for gabapentin (NEURONTIN) 300 MG capsule to be sent to  [facility] [address], [place] [address]  Phone:  [phone] Fax:  [phone] CB is [phone]

## 2020-12-07 NOTE — Telephone Encounter (Signed)
Refill sent to requested pharmacy.

## 2020-12-27 ENCOUNTER — Other Ambulatory Visit: Payer: Self-pay

## 2020-12-28 ENCOUNTER — Ambulatory Visit (INDEPENDENT_AMBULATORY_CARE_PROVIDER_SITE_OTHER): Payer: HMO | Admitting: Family Medicine

## 2020-12-28 ENCOUNTER — Encounter: Payer: Self-pay | Admitting: Family Medicine

## 2020-12-28 VITALS — BP 128/78 | HR 72 | Temp 96.0°F | Wt 156.4 lb

## 2020-12-28 DIAGNOSIS — J302 Other seasonal allergic rhinitis: Secondary | ICD-10-CM

## 2020-12-28 DIAGNOSIS — H66002 Acute suppurative otitis media without spontaneous rupture of ear drum, left ear: Secondary | ICD-10-CM | POA: Diagnosis not present

## 2020-12-28 MED ORDER — AMOXICILLIN-POT CLAVULANATE 500-125 MG PO TABS: 1.0000 | ORAL_TABLET | Freq: Two times a day (BID) | ORAL | 0 refills | Status: AC

## 2020-12-28 NOTE — Patient Instructions (Signed)
Otitis Media, Adult  Otitis media occurs when there is inflammation and fluid in the middle ear space with signs and symptoms of an acute infection. The middle ear is a part of the ear that contains bones for hearing as well as air that helps send sounds to the brain. When infected fluid builds up in this space, it causes pressure and results in symptoms of acute otitis media. The eustachian tube connects the middle ear to the back of the nose (nasopharynx) and normally allows air into the middle ear space. If the eustachian tube becomes blocked, fluid can build up and become infected. What are the causes? This condition is caused by a blockage in the eustachian tube. This can be caused by an object like mucus, or by swelling (edema) of the tube. Problems that can cause a blockage include:  A cold or other upper respiratory infection.  Allergies.  An irritant, such as tobacco smoke.  Enlarged adenoids. The adenoids are areas of soft tissue located high in the back of the throat, behind the nose and the roof of the mouth. They are part of the body's defense system (immune system).  A mass in the nasopharynx.  Damage to the ear caused by pressure changes (barotrauma). What are the signs or symptoms? Symptoms of this condition include:  Ear pain.  Fever.  Decreased hearing.  Tiredness (lethargy).  Fluid leaking from the ear, if the eardrum is ruptured or has burst.  Ringing in the ear. How is this diagnosed? This condition is diagnosed with a physical exam. During the exam, your health care provider will use an instrument called an otoscope to look in your ear and check for redness, swelling, and fluid. He or she will also ask about your symptoms. Your health care provider may also order tests, such as:  A pneumatic otoscopy. This is a test to check the movement of the eardrum. It is done by squeezing a small amount of air into the ear.  A tympanogram is a test that shows how well  the eardrum moves in response to air pressure in the ear canal. It provides a graph for your health care provider to review.   How is this treated? This condition can go away on its own within 3-5 days. But if the condition is caused by a bacterial infection and does not go away on its own, or if it keeps coming back, your health care provider may:  Prescribe antibiotic medicine to treat the infection.  Prescribe or recommend medicines to control pain. Follow these instructions at home:  Take over-the-counter and prescription medicines only as told by your health care provider.  If you were prescribed an antibiotic medicine, take it as told by your health care provider. Do not stop taking the antibiotic even if you start to feel better.  Keep all follow-up visits as told by your health care provider. This is important. Contact a health care provider if:  You have bleeding from your nose.  There is a lump on your neck.  You are not feeling better in 5 days.  You feel worse instead of better. Get help right away if:  You have severe pain that is not controlled with medicine.  You have swelling, redness, or pain around your ear.  You have stiffness in your neck.  A part of your face is not moving (paralyzed).  The bone behind your ear (mastoid) is tender when you touch it.  You develop a severe headache. Summary  Otitis media is redness, soreness, and swelling of the middle ear, usually resulting in pain.  This condition can go away on its own within 3-5 days.  If the problem does not go away in 3-5 days, your health care provider may prescribe or recommend medicines to treat the infection or your symptoms.  If you were prescribed an antibiotic medicine, take it as told by your health care provider.  Follow all instructions you were given by your health care provider. This information is not intended to replace advice given to you by your health care provider. Make sure  you discuss any questions you have with your health care provider. Document Revised: 07/16/2019 Document Reviewed: 07/16/2019 Elsevier Patient Education  2021 [employer].

## 2020-12-28 NOTE — Progress Notes (Signed)
Subjective:    Patient ID: Renee Watts, female    DOB: 1951-05-31, 70 y.o.   MRN: [id number]  Chief Complaint  Patient presents with  . Sinus Problem    Has been working in yard and has sinus pressure in the face and in ears    HPI Patient was seen today for acute concern.  Pt with pressure across bridge of nose, L ear with sensation of fluid in it.  Symptoms started after working in the yard 3 days ago. Pt endorses increased pollen outside.  Tried Zyrtec and flonase for symptoms which have not helped.  Denies HAs, ear pain, sore throat, congestion, cough, watery eyes, rhinorrhea.  Past Medical History:  Diagnosis Date  . Allergy   . Benign paroxysmal positional vertigo 06/25/2013   dx with prior PCP, s/p labs, mri and vestibular rehab; nasal spray for etd seemed to help  . Blood in stool 03/11/2012  . Dependent edema    Bilateral  . Depression   . GERD (gastroesophageal reflux disease)   . Hyperlipidemia   . Low back pain    sciatica; sees orthopedic specialist ([facility])for this and shoulder tendinitis  . Obesity, diabetes, and hypertension syndrome ([place])   . Osteoarthritis, knee    Bilateral, s/p 3 total knee replacement surgerues on right, last 2005. Dr. Tonita Cong  . Pellegrini-Steida syndrome    Myositis ossificans  . Seasonal allergies   . Shingles Jul 10 2012   LESIONS CLEARED BUT NOT TOLERATING ANY THING TIGHT AGAINST BODY--SHINGLES WERE AROSS BACK AND ABDOMEN    Not on File  ROS General: Denies fever, chills, night sweats, changes in weight, changes in appetite HEENT: Denies headaches, ear pain, changes in vision, rhinorrhea, sore throat +fluid in ear, pressure across nose CV: Denies CP, palpitations, SOB, orthopnea Pulm: Denies SOB, cough, wheezing GI: Denies abdominal pain, nausea, vomiting, diarrhea, constipation GU: Denies dysuria, hematuria, frequency, vaginal discharge Msk: Denies muscle cramps, joint pains Neuro: Denies weakness, numbness,  tingling Skin: Denies rashes, bruising Psych: Denies depression, anxiety, hallucinations     Objective:    Blood pressure 128/78, pulse 72, temperature (!) 96 F (35.6 C), temperature source Temporal, weight 156 lb 6.4 oz (70.9 kg), SpO2 97 %.  Gen. Pleasant, well-nourished, in no distress, normal affect  HEENT: [place]/AT, face symmetric, conjunctiva clear, no scleral icterus, PERRLA, EOMI, nares patent without drainage. Left TM full with suppurative fluid and erythema.  Right TM normal. Lungs: no accessory muscle use, CTAB, no wheezes or rales Cardiovascular: RRR, no m/r/g, no peripheral edema Musculoskeletal: No deformities, no cyanosis or clubbing, normal tone Neuro:  A&Ox3, CN II-XII intact, normal gait Skin:  Warm, no lesions/ rash   Wt Readings from Last 3 Encounters:  12/28/20 156 lb 6.4 oz (70.9 kg)  10/28/20 172 lb (78 kg)  06/27/20 175 lb (79.4 kg)    Lab Results  Component Value Date   WBC 5.5 06/27/2020   HGB 12.7 06/27/2020   HCT 38.3 06/27/2020   PLT 148 06/27/2020   GLUCOSE 181 (H) 06/27/2020   CHOL 205 (H) 06/27/2020   TRIG 132 06/27/2020   HDL 39 (L) 06/27/2020   LDLDIRECT 137.8 10/12/2013   LDLCALC 140 (H) 06/27/2020   ALT 27 06/27/2020   AST 21 06/27/2020   NA 140 06/27/2020   K 4.4 06/27/2020   CL 105 06/27/2020   CREATININE 0.42 (L) 06/27/2020   BUN 19 06/27/2020   CO2 29 06/27/2020   TSH 1.20 05/10/2015   INR 0.94  01/05/2013   HGBA1C 6.9 (H) 06/27/2020   MICROALBUR 0.5 03/11/2014    Assessment/Plan:  Acute suppurative otitis media of left ear without spontaneous rupture of tympanic membrane, recurrence not specified -Okay to take Tylenol for any pain/discomfort -Continue Flonase - Plan: amoxicillin-clavulanate (AUGMENTIN) 500-125 MG tablet  Seasonal allergies -Continue OTC antihistamine.  Consider switching from Zyrtec to Allegra, Claritin, or Xyzal. -Okay to continue Flonase or saline nasal rinse  F/u as needed  Grier Mitts, MD

## 2021-01-20 DIAGNOSIS — H2511 Age-related nuclear cataract, right eye: Secondary | ICD-10-CM | POA: Diagnosis not present

## 2021-01-20 DIAGNOSIS — H25812 Combined forms of age-related cataract, left eye: Secondary | ICD-10-CM | POA: Diagnosis not present

## 2021-01-20 DIAGNOSIS — E119 Type 2 diabetes mellitus without complications: Secondary | ICD-10-CM | POA: Diagnosis not present

## 2021-01-23 ENCOUNTER — Other Ambulatory Visit: Payer: Self-pay | Admitting: Family Medicine

## 2021-01-23 DIAGNOSIS — E1149 Type 2 diabetes mellitus with other diabetic neurological complication: Secondary | ICD-10-CM

## 2021-02-13 ENCOUNTER — Other Ambulatory Visit: Payer: Self-pay

## 2021-02-13 ENCOUNTER — Ambulatory Visit
Admission: RE | Admit: 2021-02-13 | Discharge: 2021-02-13 | Disposition: A | Discharge status: Home / Self Care | Payer: HMO | Source: Ambulatory Visit | Attending: Family Medicine | Admitting: Family Medicine

## 2021-02-13 DIAGNOSIS — M85852 Other specified disorders of bone density and structure, left thigh: Secondary | ICD-10-CM | POA: Diagnosis not present

## 2021-02-13 DIAGNOSIS — Z13820 Encounter for screening for osteoporosis: Secondary | ICD-10-CM

## 2021-02-13 DIAGNOSIS — Z780 Asymptomatic menopausal state: Secondary | ICD-10-CM | POA: Diagnosis not present

## 2021-02-15 ENCOUNTER — Encounter: Payer: Self-pay | Admitting: Family Medicine

## 2021-02-15 DIAGNOSIS — M8580 Other specified disorders of bone density and structure, unspecified site: Secondary | ICD-10-CM | POA: Insufficient documentation

## 2021-03-02 ENCOUNTER — Telehealth: Payer: Self-pay

## 2021-03-02 ENCOUNTER — Ambulatory Visit: Payer: Self-pay

## 2021-03-02 ENCOUNTER — Other Ambulatory Visit: Payer: Self-pay

## 2021-03-02 ENCOUNTER — Encounter: Payer: Self-pay | Admitting: Surgery

## 2021-03-02 ENCOUNTER — Ambulatory Visit: Payer: HMO | Admitting: Surgery

## 2021-03-02 VITALS — Ht 66.0 in | Wt 156.4 lb

## 2021-03-02 DIAGNOSIS — M25571 Pain in right ankle and joints of right foot: Secondary | ICD-10-CM | POA: Diagnosis not present

## 2021-03-02 DIAGNOSIS — S8264XA Nondisplaced fracture of lateral malleolus of right fibula, initial encounter for closed fracture: Secondary | ICD-10-CM

## 2021-03-02 MED ORDER — TRAMADOL HCL 50 MG PO TABS: 50.0000 mg | ORAL_TABLET | Freq: Three times a day (TID) | ORAL | 0 refills | Status: DC | PRN

## 2021-03-02 NOTE — Telephone Encounter (Signed)
Patient called she stated she fell due to both of her knees giving out, patient stated her ankles are throbbing and the right ankle is starting to swell patient also stated she is unable to walk, patient is requesting to be worked into [employer] schedule today if possible call back:[phone]

## 2021-03-02 NOTE — Progress Notes (Signed)
Office Visit Note   Patient: Renee Watts           Date of Birth: [birth year]           MRN: [id number] Visit Date: 03/02/2021              Requested by: Billie Ruddy, MD [place],   [id number] PCP: Billie Ruddy, MD   Assessment & Plan: Visit Diagnoses:  1. Pain in right ankle and joints of right foot   2. Closed nondisplaced fracture of lateral malleolus of right fibula, initial encounter     Plan: Reviewed x-rays with patient.  She may have a nondisplaced lateral malleolus fracture.  She was put in a cam boot and instructed to wear this at all times.  She will use a walker.  Can touchdown weight-bear for support if needed.  I did asked Dr. Ninfa Linden to review x-rays as well.  He will see her back in 2 weeks for recheck.  Patient instructed to elevate her foot is much as possible to help decrease pain and swelling.  I did send in Ultram to her pharmacy.  Advised her that with her recent fall that was unprovoked I do not recommend giving a stronger narcotic.  She voices understanding.  Follow-Up Instructions: Return in about 2 weeks (around 03/16/2021) for with dr Ninfa Linden for recheck right ankle fracture.   Orders:  Orders Placed This Encounter  Procedures   XR Ankle 2 Views Right   XR Tibia/Fibula Right   No orders of the defined types were placed in this encounter.     Procedures: No procedures performed   Clinical Data: No additional findings.   Subjective: Chief Complaint  Patient presents with   Right Ankle - Injury    HPI  Review of Systems No current cardiac pulmonary GI GU issues  Objective: Vital Signs: Ht 5\' 6"  (1.676 m)   Wt 156 lb 6.4 oz (70.9 kg)   BMI 25.24 kg/m   Physical Exam  Ortho Exam  Specialty Comments:  No specialty comments available.  Imaging: No results found.   PMFS History: Patient Active Problem List   Diagnosis Date Noted   Osteopenia 02/15/2021   Venous stasis of both lower  extremities 06/27/2020   Bilateral lower extremity edema 02/05/2020   Recurrent sinusitis 09/03/2018   Vaginal vault prolapse after hysterectomy 02/13/2017   Obesity 01/06/2016   Low back pain 01/06/2016   Diabetic peripheral neuropathy ([place]) 09/01/2012   Hyperlipemia 09/16/2006   Essential hypertension 09/16/2006   GERD 09/16/2006   Type II diabetes mellitus with neurological manifestations ([place]) 06/17/2006   Osteoarthritis 06/17/2006   DEPENDENT EDEMA, LEGS, BILATERAL 06/17/2006   Past Medical History:  Diagnosis Date   Allergy    Benign paroxysmal positional vertigo 06/25/2013   dx with prior PCP, s/p labs, mri and vestibular rehab; nasal spray for etd seemed to help   Blood in stool 03/11/2012   Dependent edema    Bilateral   Depression    GERD (gastroesophageal reflux disease)    Hyperlipidemia    Low back pain    sciatica; sees orthopedic specialist ([facility])for this and shoulder tendinitis   Obesity, diabetes, and hypertension syndrome ([place])    Osteoarthritis, knee    Bilateral, s/p 3 total knee replacement surgerues on right, last 2005. Dr. Tonita Cong   Pellegrini-Steida syndrome    Myositis ossificans   Seasonal allergies    Shingles Jul 10 2012   LESIONS  CLEARED BUT NOT TOLERATING ANY THING TIGHT AGAINST BODY--SHINGLES WERE AROSS BACK AND ABDOMEN    Family History  Problem Relation Age of Onset   Diabetes Mother    Hypertension Mother    Hypertension Father    Cancer Father        Lung CA   Coronary artery disease Father    Colon cancer Neg Hx    Colon polyps Neg Hx    Esophageal cancer Neg Hx    Rectal cancer Neg Hx    Stomach cancer Neg Hx     Past Surgical History:  Procedure Laterality Date   ABDOMINAL HYSTERECTOMY  1984   BLADDER SURGERY  03/2017   bladder tac per pt   COLONOSCOPY     left shoulder steroid injection     POLYPECTOMY     TONSILLECTOMY  1977   TOTAL HIP ARTHROPLASTY Right 10/10/2012   Procedure: R TOTAL HIP ARTHROPLASTY ANTERIOR  APPROACH;  Surgeon: Mcarthur Rossetti, MD;  Location: [facility];  Service: Orthopedics;  Laterality: Right;  Right Total Hip Arthroplasty, Anterior Approach (C-Arm)   TOTAL KNEE ARTHROPLASTY Right    with multiple revisions, 2003, 2005   TOTAL KNEE ARTHROPLASTY Left 01/09/2013   Procedure: LEFT TOTAL KNEE ARTHROPLASTY;  Surgeon: Mcarthur Rossetti, MD;  Location: [facility];  Service: Orthopedics;  Laterality: Left;   TUBAL LIGATION  1978   WISDOM TOOTH EXTRACTION  2013,2014   Social History   Occupational History    Employer: [employer]  Tobacco Use   Smoking status: Never   Smokeless tobacco: Never  Substance and Sexual Activity   Alcohol use: No   Drug use: No   Sexual activity: Not on file

## 2021-03-03 ENCOUNTER — Ambulatory Visit: Payer: HMO | Admitting: Family Medicine

## 2021-03-03 ENCOUNTER — Telehealth: Payer: Self-pay | Admitting: Family Medicine

## 2021-03-03 NOTE — Telephone Encounter (Signed)
Pt called by this provider.  States she fell while going to the bathroom, her knees buckled under her.  Seen by Ortho, Dr. Rush Farmer.  States has a compound fx, wearing a boot for at least the next 2 wks.  States she is being obedient, sitting with her foot elevated.  Is prayerful she will not have to have surgery to place a rod.  Pt updates this provider on her husband, also seen by this provider, who had eye surgery today.  Mr. Wann is doing well, resting at this time.

## 2021-03-03 NOTE — Telephone Encounter (Signed)
Pt states she was supposed to see Dr. Volanda Napoleon today, however she had to go ahead and go to her ortho dr.  She took a fall and now has a compound fracture in her R ankle.  Dr told her to ice every 20 mins and do nothing with that side.  She wanted to let Dr. Volanda Napoleon know.

## 2021-03-09 ENCOUNTER — Encounter: Payer: Self-pay | Admitting: Family Medicine

## 2021-03-09 ENCOUNTER — Telehealth (INDEPENDENT_AMBULATORY_CARE_PROVIDER_SITE_OTHER): Payer: HMO | Admitting: Family Medicine

## 2021-03-09 ENCOUNTER — Telehealth: Payer: Self-pay | Admitting: Orthopaedic Surgery

## 2021-03-09 ENCOUNTER — Ambulatory Visit (INDEPENDENT_AMBULATORY_CARE_PROVIDER_SITE_OTHER): Payer: HMO

## 2021-03-09 VITALS — Temp 98.0°F

## 2021-03-09 DIAGNOSIS — Z0000 Encounter for general adult medical examination without abnormal findings: Secondary | ICD-10-CM

## 2021-03-09 DIAGNOSIS — U071 COVID-19: Secondary | ICD-10-CM | POA: Diagnosis not present

## 2021-03-09 MED ORDER — BENZONATATE 100 MG PO CAPS: 100.0000 mg | ORAL_CAPSULE | Freq: Three times a day (TID) | ORAL | 0 refills | Status: DC | PRN

## 2021-03-09 MED ORDER — MOLNUPIRAVIR EUA 200MG CAPSULE: 4.0000 | ORAL_CAPSULE | Freq: Two times a day (BID) | ORAL | 0 refills | Status: AC

## 2021-03-09 NOTE — Patient Instructions (Signed)
Renee Watts , Thank you for taking time to come for your Medicare Wellness Visit. I appreciate your ongoing commitment to your health goals. Please review the following plan we discussed and let me know if I can assist you in the future.   Screening recommendations/referrals: Colonoscopy: 02/17/2019  due 2023 Mammogram: 09/14/2020 Bone Density: 02/13/2021 Recommended yearly ophthalmology/optometry visit for glaucoma screening and checkup Recommended yearly dental visit for hygiene and checkup  Vaccinations: Influenza vaccine: due in fall 2022  Pneumococcal vaccine: completed series  Tdap vaccine: due with injury  Shingles vaccine: will obtain local pharmacy     Advanced directives: will provide copies   Conditions/risks identified: none   Next appointment: none    Preventive Care 71 Years and Older, Female Preventive care refers to lifestyle choices and visits with your health care provider that can promote health and wellness. What does preventive care include? A yearly physical exam. This is also called an annual well check. Dental exams once or twice a year. Routine eye exams. Ask your health care provider how often you should have your eyes checked. Personal lifestyle choices, including: Daily care of your teeth and gums. Regular physical activity. Eating a healthy diet. Avoiding tobacco and drug use. Limiting alcohol use. Practicing safe sex. Taking low-dose aspirin every day. Taking vitamin and mineral supplements as recommended by your health care provider. What happens during an annual well check? The services and screenings done by your health care provider during your annual well check will depend on your age, overall health, lifestyle risk factors, and family history of disease. Counseling  Your health care provider may ask you questions about your: Alcohol use. Tobacco use. Drug use. Emotional well-being. Home and relationship well-being. Sexual  activity. Eating habits. History of falls. Memory and ability to understand (cognition). Work and work Statistician. Reproductive health. Screening  You may have the following tests or measurements: Height, weight, and BMI. Blood pressure. Lipid and cholesterol levels. These may be checked every 5 years, or more frequently if you are over 74 years old. Skin check. Lung cancer screening. You may have this screening every year starting at age 34 if you have a 30-pack-year history of smoking and currently smoke or have quit within the past 15 years. Fecal occult blood test (FOBT) of the stool. You may have this test every year starting at age 48. Flexible sigmoidoscopy or colonoscopy. You may have a sigmoidoscopy every 5 years or a colonoscopy every 10 years starting at age 82. Hepatitis C blood test. Hepatitis B blood test. Sexually transmitted disease (STD) testing. Diabetes screening. This is done by checking your blood sugar (glucose) after you have not eaten for a while (fasting). You may have this done every 1-3 years. Bone density scan. This is done to screen for osteoporosis. You may have this done starting at age 41. Mammogram. This may be done every 1-2 years. Talk to your health care provider about how often you should have regular mammograms. Talk with your health care provider about your test results, treatment options, and if necessary, the need for more tests. Vaccines  Your health care provider may recommend certain vaccines, such as: Influenza vaccine. This is recommended every year. Tetanus, diphtheria, and acellular pertussis (Tdap, Td) vaccine. You may need a Td booster every 10 years. Zoster vaccine. You may need this after age 62. Pneumococcal 13-valent conjugate (PCV13) vaccine. One dose is recommended after age 65. Pneumococcal polysaccharide (PPSV23) vaccine. One dose is recommended after age 61. Talk to  your health care provider about which screenings and vaccines  you need and how often you need them. This information is not intended to replace advice given to you by your health care provider. Make sure you discuss any questions you have with your health care provider. Document Released: 09/09/2015 Document Revised: 05/02/2016 Document Reviewed: 06/14/2015 Elsevier Interactive Patient Education  2017 [place] Prevention in the Home Falls can cause injuries. They can happen to people of all ages. There are many things you can do to make your home safe and to help prevent falls. What can I do on the outside of my home? Regularly fix the edges of walkways and driveways and fix any cracks. Remove anything that might make you trip as you walk through a door, such as a raised step or threshold. Trim any bushes or trees on the path to your home. Use bright outdoor lighting. Clear any walking paths of anything that might make someone trip, such as rocks or tools. Regularly check to see if handrails are loose or broken. Make sure that both sides of any steps have handrails. Any raised decks and porches should have guardrails on the edges. Have any leaves, snow, or ice cleared regularly. Use sand or salt on walking paths during winter. Clean up any spills in your garage right away. This includes oil or grease spills. What can I do in the bathroom? Use night lights. Install grab bars by the toilet and in the tub and shower. Do not use towel bars as grab bars. Use non-skid mats or decals in the tub or shower. If you need to sit down in the shower, use a plastic, non-slip stool. Keep the floor dry. Clean up any water that spills on the floor as soon as it happens. Remove soap buildup in the tub or shower regularly. Attach bath mats securely with double-sided non-slip rug tape. Do not have throw rugs and other things on the floor that can make you trip. What can I do in the bedroom? Use night lights. Make sure that you have a light by your bed that  is easy to reach. Do not use any sheets or blankets that are too big for your bed. They should not hang down onto the floor. Have a firm chair that has side arms. You can use this for support while you get dressed. Do not have throw rugs and other things on the floor that can make you trip. What can I do in the kitchen? Clean up any spills right away. Avoid walking on wet floors. Keep items that you use a lot in easy-to-reach places. If you need to reach something above you, use a strong step stool that has a grab bar. Keep electrical cords out of the way. Do not use floor polish or wax that makes floors slippery. If you must use wax, use non-skid floor wax. Do not have throw rugs and other things on the floor that can make you trip. What can I do with my stairs? Do not leave any items on the stairs. Make sure that there are handrails on both sides of the stairs and use them. Fix handrails that are broken or loose. Make sure that handrails are as long as the stairways. Check any carpeting to make sure that it is firmly attached to the stairs. Fix any carpet that is loose or worn. Avoid having throw rugs at the top or bottom of the stairs. If you do have throw rugs, attach  them to the floor with carpet tape. Make sure that you have a light switch at the top of the stairs and the bottom of the stairs. If you do not have them, ask someone to add them for you. What else can I do to help prevent falls? Wear shoes that: Do not have high heels. Have rubber bottoms. Are comfortable and fit you well. Are closed at the toe. Do not wear sandals. If you use a stepladder: Make sure that it is fully opened. Do not climb a closed stepladder. Make sure that both sides of the stepladder are locked into place. Ask someone to hold it for you, if possible. Clearly mark and make sure that you can see: Any grab bars or handrails. First and last steps. Where the edge of each step is. Use tools that help you  move around (mobility aids) if they are needed. These include: Canes. Walkers. Scooters. Crutches. Turn on the lights when you go into a dark area. Replace any light bulbs as soon as they burn out. Set up your furniture so you have a clear path. Avoid moving your furniture around. If any of your floors are uneven, fix them. If there are any pets around you, be aware of where they are. Review your medicines with your doctor. Some medicines can make you feel dizzy. This can increase your chance of falling. Ask your doctor what other things that you can do to help prevent falls. This information is not intended to replace advice given to you by your health care provider. Make sure you discuss any questions you have with your health care provider. Document Released: 06/09/2009 Document Revised: 01/19/2016 Document Reviewed: 09/17/2014 Elsevier Interactive Patient Education  2017 [employer].

## 2021-03-09 NOTE — Progress Notes (Signed)
Subjective:   Renee Watts is a 70 y.o. female who presents for Medicare Annual (Subsequent) preventive examination.  Patient says she cannot  connect changed to telephone visit.   Virtual Visit via Video Note  I connected with Renee Watts by a video enabled telemedicine application and verified that I am speaking with the correct person using two identifiers.  Location: Patient: Home Provider: Office Persons participating in the virtual visit: patient, provider   I discussed the limitations of evaluation and management by telemedicine and the availability of in person appointments. The patient expressed understanding and agreed to proceed.     Renee [name] Amyriah Buras,LPN  Review of Systems    N/A       Objective:    There were no vitals filed for this visit. There is no height or weight on file to calculate BMI.  Advanced Directives 03/08/2020 04/14/2018 04/08/2018 04/12/2017 01/09/2013 01/09/2013 01/05/2013  Does Patient Have a Medical Advance Directive? No No Yes No Patient does not have advance directive;Patient would not like information - Patient does not have advance directive;Patient would not like information  Type of Advance Directive - - Healthcare Power of Attorney - - - -  Does patient want to make changes to medical advance directive? - - No - Patient declined - - - -  Would patient like information on creating a medical advance directive? No - Patient declined - - - - - -  Pre-existing out of facility DNR order (yellow form or pink MOST form) - - - - No No No    Current Medications (verified) Outpatient Encounter Medications as of 03/09/2021  Medication Sig   Accu-Chek Softclix Lancets lancets daily. for testing as directed   acetaminophen (TYLENOL) 650 MG CR tablet Take by mouth.   benzonatate (TESSALON PERLES) 100 MG capsule Take 1 capsule (100 mg total) by mouth 3 (three) times daily as needed.   Blood Glucose Monitoring Suppl (ONETOUCH VERIO IQ SYSTEM) W/DEVICE  KIT by Does not apply route.   CALCIUM PO Take by mouth daily.   cetirizine (ZYRTEC) 10 MG tablet Take 10 mg by mouth at bedtime.   Cyanocobalamin (B-12 PO) Take by mouth daily.   cyclobenzaprine (FLEXERIL) 5 MG tablet Take 1 tablet (5 mg total) by mouth at bedtime as needed for muscle spasms.   ferrous gluconate (FERGON) 324 MG tablet Take 1 tablet (324 mg total) by mouth every other day.   fluticasone (FLONASE) 50 MCG/ACT nasal spray Place 1 spray into both nostrils daily.   furosemide (LASIX) 40 MG tablet TAKE 1/2 TABLET(20 MG) BY MOUTH DAILY   gabapentin (NEURONTIN) 300 MG capsule Take 300 mg by mouth 4 (four) times daily.   ketotifen (ZADITOR) 0.025 % ophthalmic solution Place 1 drop into both eyes as needed (ALLERGIES).   lisinopril (ZESTRIL) 20 MG tablet TAKE 1 TABLET BY MOUTH EVERY DAY   metFORMIN (GLUCOPHAGE) 1000 MG tablet TAKE 1 TABLET BY MOUTH TWICE DAILY WITH A MEAL   molnupiravir EUA 200 mg CAPS Take 4 capsules (800 mg total) by mouth 2 (two) times daily for 5 days.   omeprazole (PRILOSEC) 10 MG capsule Take 10 mg by mouth daily as needed.   ONETOUCH VERIO test strip USE DAILY AS DIRECTED   potassium chloride SA (KLOR-CON) 20 MEQ tablet TAKE 1 TABLET(20 MEQ) BY MOUTH DAILY   traMADol (ULTRAM) 50 MG tablet Take 1 tablet (50 mg total) by mouth every 8 (eight) hours as needed.   VITAMIN D PO Take by  mouth daily.   No facility-administered encounter medications on file as of 03/09/2021.    Allergies (verified) Patient has no known allergies.   History: Past Medical History:  Diagnosis Date   Allergy    Benign paroxysmal positional vertigo 06/25/2013   dx with prior PCP, s/p labs, mri and vestibular rehab; nasal spray for etd seemed to help   Blood in stool 03/11/2012   Dependent edema    Bilateral   Depression    GERD (gastroesophageal reflux disease)    Hyperlipidemia    Low back pain    sciatica; sees orthopedic specialist ([facility])for this and shoulder  tendinitis   Obesity, diabetes, and hypertension syndrome ([place])    Osteoarthritis, knee    Bilateral, s/p 3 total knee replacement surgerues on right, last 2005. Dr. Tonita Cong   Pellegrini-Steida syndrome    Myositis ossificans   Seasonal allergies    Shingles Jul 10 2012   LESIONS CLEARED BUT NOT TOLERATING ANY THING TIGHT AGAINST BODY--SHINGLES WERE AROSS BACK AND ABDOMEN   Past Surgical History:  Procedure Laterality Date   ABDOMINAL HYSTERECTOMY  1984   BLADDER SURGERY  03/2017   bladder tac per pt   COLONOSCOPY     left shoulder steroid injection     POLYPECTOMY     TONSILLECTOMY  1977   TOTAL HIP ARTHROPLASTY Right 10/10/2012   Procedure: R TOTAL HIP ARTHROPLASTY ANTERIOR APPROACH;  Surgeon: Mcarthur Rossetti, MD;  Location: [facility];  Service: Orthopedics;  Laterality: Right;  Right Total Hip Arthroplasty, Anterior Approach (C-Arm)   TOTAL KNEE ARTHROPLASTY Right    with multiple revisions, 2003, 2005   TOTAL KNEE ARTHROPLASTY Left 01/09/2013   Procedure: LEFT TOTAL KNEE ARTHROPLASTY;  Surgeon: Mcarthur Rossetti, MD;  Location: [facility];  Service: Orthopedics;  Laterality: Left;   TUBAL LIGATION  1978   WISDOM TOOTH EXTRACTION  2013,2014   Family History  Problem Relation Age of Onset   Diabetes Mother    Hypertension Mother    Hypertension Father    Cancer Father        Lung CA   Coronary artery disease Father    Colon cancer Neg Hx    Colon polyps Neg Hx    Esophageal cancer Neg Hx    Rectal cancer Neg Hx    Stomach cancer Neg Hx    Social History   Socioeconomic History   Marital status: Married    Spouse name: Not on file   Number of children: Not on file   Years of education: 12th grade   Highest education level: Not on file  Occupational History    Employer: [employer]  Tobacco Use   Smoking status: Never   Smokeless tobacco: Never  Substance and Sexual Activity   Alcohol use: No   Drug use: No   Sexual activity: Not on file  Other Topics  Concern   Not on file  Social History Narrative   Married, 3 daughters.   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   Nicotine: never   ETOH: no   Drugs: no   Social Determinants of Radio broadcast assistant Strain: Not on file  Food Insecurity: Not on file  Transportation Needs: Not on file  Physical Activity: Not on file  Stress: Not on file  Social Connections: Not on file    Tobacco Counseling Counseling given: Not Answered   Clinical Intake:  Diabetic?yes Nutrition Risk Assessment:  Has the patient had any N/V/D within the last 2 months?  No  Does the patient have any non-healing wounds?  No  Has the patient had any unintentional weight loss or weight gain?  No   Diabetes:  Is the patient diabetic?  Yes  If diabetic, was a CBG obtained today?  No  Did the patient bring in their glucometer from home?  No  How often do you monitor your CBG's? 2 three times a week.   Financial Strains and Diabetes Management:  Are you having any financial strains with the device, your supplies or your medication? No .  Does the patient want to be seen by Chronic Care Management for management of their diabetes?  No  Would the patient like to be referred to a Nutritionist or for Diabetic Management?  No   Diabetic Exams:  Diabetic Eye Exam: Completed 11/2020 Diabetic Foot Exam: Overdue, Pt has been advised about the importance in completing this exam. Pt is scheduled for diabetic foot exam on next office visit .          Activities of Daily Living No flowsheet data found.  Patient Care Team: Billie Ruddy, MD as PCP - General (Family Medicine) Plyler, Chauncey Reading, RD as Dietitian (Dietician) Ara Kussmaul, MD (Ophthalmology) Alden Hipp, MD as Consulting Physician (Obstetrics and Gynecology)  Indicate any recent Medical Services you may have received from other than [facility] providers in the past year (date  may be approximate).     Assessment:   This is a routine wellness examination for [place].  Hearing/Vision screen No results found.  Dietary issues and exercise activities discussed:     Goals Addressed   None    Depression Screen PHQ 2/9 Scores 03/08/2020 04/14/2018 04/12/2017 04/12/2017 08/25/2015 07/07/2013 06/25/2013  PHQ - 2 Score 0 0 0 0 1 0 0  PHQ- 9 Score 0 - - - - - -    Fall Risk Fall Risk  03/08/2020 07/17/2019 04/14/2018 04/12/2017 04/12/2017  Falls in the past year? 1 0 No No No  Comment - Emmi Telephone Survey: data to providers prior to load - - -  Number falls in past yr: 0 - - - -  Injury with Fall? 0 - - - -  Risk for fall due to : History of fall(s) - - - -  Risk for fall due to: Comment - - - - -  Follow up Falls evaluation completed;Falls prevention discussed - - - -    FALL RISK PREVENTION PERTAINING TO THE HOME:  Any stairs in or around the home? No  If so, are there any without handrails? No  Home free of loose throw rugs in walkways, pet beds, electrical cords, etc? Yes  Adequate lighting in your home to reduce risk of falls? Yes   ASSISTIVE DEVICES UTILIZED TO PREVENT FALLS:  Life alert? No  Use of a cane, walker or w/c? Yes  Grab bars in the bathroom? Yes  Shower chair or bench in shower? Yes  Elevated toilet seat or a handicapped toilet? No    Cognitive Function: Normal cognitive status assessed by direct observation by this Nurse Health Advisor. No abnormalities found.   MMSE - Mini Mental State Exam 04/14/2018 04/12/2017  Not completed: (No Data) (No Data)     6CIT Screen 03/08/2020  What Year? 0 points  What month? 0 points  What time? 0 points  Count back from 20 0 points  Months in  reverse 0 points  Repeat phrase 0 points  Total Score 0    Immunizations Immunization History  Administered Date(s) Administered   Fluad Quad(high Dose 65+) 06/27/2020   Influenza Split 06/04/2011, 05/07/2012   Influenza Whole 07/08/2007,  05/27/2008, 07/14/2009, 07/18/2010   Influenza, High Dose Seasonal PF 05/10/2015, 05/10/2016, 08/26/2017, 06/11/2018, 05/30/2019   Influenza,inj,Quad PF,6+ Mos 05/26/2013, 06/14/2014   Influenza-Unspecified 05/27/2016   PFIZER(Purple Top)SARS-COV-2 Vaccination 10/19/2019, 11/09/2019, 06/03/2020   Pneumococcal Conjugate-13 05/10/2015   Pneumococcal Polysaccharide-23 07/18/2010, 05/10/2016   Td 07/18/2010    TDAP status: Due, Education has been provided regarding the importance of this vaccine. Advised may receive this vaccine at local pharmacy or Health Dept. Aware to provide a copy of the vaccination record if obtained from local pharmacy or Health Dept. Verbalized acceptance and understanding.  Flu Vaccine status: Up to date  Pneumococcal vaccine status: Up to date  Covid-19 vaccine status: Completed vaccines  Qualifies for Shingles Vaccine? Yes   Zostavax completed No   Shingrix Completed?: Yes  Screening Tests Health Maintenance  Topic Date Due   Zoster Vaccines- Shingrix (1 of 2) Never done   TETANUS/TDAP  07/18/2020   HEMOGLOBIN A1C  12/25/2020   FOOT EXAM  01/21/2021   COVID-19 Vaccine (4 - Booster for Pfizer series) 03/25/2021 (Originally 10/04/2020)   INFLUENZA VACCINE  03/27/2021   LIPID PANEL  06/27/2021   OPHTHALMOLOGY EXAM  11/25/2021   COLONOSCOPY (Pts 45-74yrs Insurance coverage will need to be confirmed)  02/16/2022   MAMMOGRAM  09/14/2022   DEXA SCAN  Completed   Hepatitis C Screening  Completed   PNA vac Low Risk Adult  Completed   HPV VACCINES  Aged Out    Health Maintenance  Health Maintenance Due  Topic Date Due   Zoster Vaccines- Shingrix (1 of 2) Never done   TETANUS/TDAP  07/18/2020   HEMOGLOBIN A1C  12/25/2020   FOOT EXAM  01/21/2021    Colorectal cancer screening: Type of screening: Colonoscopy. Completed 02/17/2019. Repeat every 3 years  Mammogram status: Completed 09/14/2020. Repeat every year  Bone Density status: Completed 02/13/2021.  Results reflect: Bone density results: OSTEOPENIA. Repeat every 5 years.  Lung Cancer Screening: (Low Dose CT Chest recommended if Age 76-80 years, 30 pack-year currently smoking OR have quit w/in 15years.) does not qualify.   Lung Cancer Screening Referral: n/a  Additional Screening:  Hepatitis C Screening: does not qualify; Completed 09/09/2015  Vision Screening: Recommended annual ophthalmology exams for early detection of glaucoma and other disorders of the eye. Is the patient up to date with their annual eye exam?  Yes  Who is the provider or what is the name of the office in which the patient attends annual eye exams? Dr.Grote  If pt is not established with a provider, would they like to be referred to a provider to establish care? No .   Dental Screening: Recommended annual dental exams for proper oral hygiene  Community Resource Referral / Chronic Care Management: CRR required this visit?  No   CCM required this visit?  No      Plan:     I have personally reviewed and noted the following in the patient's chart:   Medical and social history Use of alcohol, tobacco or illicit drugs  Current medications and supplements including opioid prescriptions.  Functional ability and status Nutritional status Physical activity Advanced directives List of other physicians Hospitalizations, surgeries, and ER visits in previous 12 months Vitals Screenings to include cognitive, depression, and falls Referrals  and appointments  In addition, I have reviewed and discussed with patient certain preventive protocols, quality metrics, and best practice recommendations. A written personalized care plan for preventive services as well as general preventive health recommendations were provided to patient.     Randel Pigg, LPN   03/07/1975   Nurse Notes: none

## 2021-03-09 NOTE — Telephone Encounter (Signed)
Patient called advised her right ankle is bruised and swollen. Patient also said she has been exposed to Covid-19 and can not come into the office. Patient said she is in the bed with her leg elevated. Patient said  she took the boot off about an hour ago. The number to contact patient is [phone]

## 2021-03-09 NOTE — Patient Instructions (Addendum)
HOME CARE TIPS:  -I sent the medication(s) we discussed to your pharmacy: Meds ordered this encounter  Medications   benzonatate (TESSALON PERLES) 100 MG capsule    Sig: Take 1 capsule (100 mg total) by mouth 3 (three) times daily as needed.    Dispense:  20 capsule    Refill:  0   molnupiravir EUA 200 mg CAPS    Sig: Take 4 capsules (800 mg total) by mouth 2 (two) times daily for 5 days.    Dispense:  40 capsule    Refill:  0     -I sent in the [place] treatment or referral you requested per our discussion. Please see the information provided below and discuss further with the pharmacist/treatment team.   -can use nasal saline a few times per day if you have nasal congestion  -stay hydrated, drink plenty of fluids and eat small healthy meals - avoid dairy  -can take 1000 IU (10mcg) Vit D3 and 100-500 mg of Vit C daily per instructions  -If the Covid test is positive, check out the [facility] website for more information on home care, transmission and treatment for COVID19  -follow up with your doctor in 2-3 days unless improving and feeling better  -stay home while sick, except to seek medical care. If you have COVID19, ideally it would be best to stay home for a full 10 days since the onset of symptoms PLUS one day of no fever and feeling better. Wear a good mask that fits snugly (such as N95 or KN95) if around others to reduce the risk of transmission.  It was nice to meet you today, and I really hope you are feeling better soon. I help [place] out with telemedicine visits on Tuesdays and Thursdays and am available for visits on those days. If you have any concerns or questions following this visit please schedule a follow up visit with your Primary Care doctor or seek care at a local urgent care clinic to avoid delays in care.    Seek in person care or schedule a follow up video visit promptly if your symptoms worsen, new concerns arise or you are not improving with treatment. Call  911 and/or seek emergency care if your symptoms are severe or life threatening.    Fact Sheet for Patients And Caregivers Emergency Use Authorization (EUA) Of LAGEVRIOT (molnupiravir) capsules For Coronavirus Disease 2019 (COVID-19)  What is the most important information I should know about LAGEVRIO? LAGEVRIO may cause serious side effects, including: ? LAGEVRIO may cause harm to your unborn baby. It is not known if LAGEVRIO will harm your baby if you take LAGEVRIO during pregnancy. o LAGEVRIO is not recommended for use in pregnancy. o LAGEVRIO has not been studied in pregnancy. LAGEVRIO was studied in pregnant animals only. When LAGEVRIO was given to pregnant animals, LAGEVRIO caused harm to their unborn babies. o You and your healthcare provider may decide that you should take LAGEVRIO during pregnancy if there are no other COVID-19 treatment options approved or authorized by the FDA that are accessible or clinically appropriate for you. o If you and your healthcare provider decide that you should take LAGEVRIO during pregnancy, you and your healthcare provider should discuss the known and potential benefits and the potential risks of taking LAGEVRIO during pregnancy. For individuals who are able to become pregnant: ? You should use a reliable method of birth control (contraception) consistently and correctly during treatment with LAGEVRIO and for 4 days after the last dose  of LAGEVRIO. Talk to your healthcare provider about reliable birth control methods. ? Before starting treatment with [facility] your healthcare provider may do a pregnancy test to see if you are pregnant before starting treatment with LAGEVRIO. ? Tell your healthcare provider right away if you become pregnant or think you may be pregnant during treatment with LAGEVRIO. Pregnancy Surveillance Program: ? There is a pregnancy surveillance program for individuals who take LAGEVRIO during pregnancy. The purpose of this  program is to collect information about the health of you and your baby. Talk to your healthcare provider about how to take part in this program. ? If you take LAGEVRIO during pregnancy and you agree to participate in the pregnancy surveillance program and allow your healthcare provider to share your information with [place], then your healthcare provider will report your use of [place] during pregnancy to [place]. by calling [phone] or PeacefulBlog.es. For individuals who are sexually active with partners who are able to become pregnant: ? It is not known if LAGEVRIO can affect sperm. While the risk is regarded as low, animal studies to fully assess the potential for LAGEVRIO to affect the babies of males treated with LAGEVRIO have not been completed. A reliable method of birth control (contraception) should be used consistently and correctly during treatment with LAGEVRIO and for at least 3 months after the last dose. The risk to sperm beyond 3 months is not known. Studies to understand the risk to sperm beyond 3 months are ongoing. Talk to your healthcare provider about reliable birth control methods. Talk to your healthcare provider if you have questions or concerns about how LAGEVRIO may affect sperm. You are being given this fact sheet because your healthcare provider believes it is necessary to provide you with LAGEVRIO for the treatment of adults with mild-to-moderate coronavirus disease 2019 (COVID-19) with positive results of direct SARS-CoV-2 viral testing, and who are at high risk for progression to severe COVID-19 including hospitalization or death, and for whom other COVID-19 treatment options approved or authorized by the FDA are not accessible or clinically appropriate. The U.S. Food and Drug Administration (FDA) has issued an Emergency Use Authorization (EUA) to make LAGEVRIO available during the COVID-19 pandemic (for more  details about an EUA please see "What is an Emergency Use Authorization?" at the end of this document). LAGEVRIO is not an FDA-approved medicine in the [place]. Read this Fact Sheet for information about LAGEVRIO. Talk to your healthcare provider about your options if you have any questions. It is your choice to take LAGEVRIO.  What is COVID-19? COVID-19 is caused by a virus called a coronavirus. You can get COVID-19 through close contact with another person who has the virus. COVID-19 illnesses have ranged from very mild-to-severe, including illness resulting in death. While information so far suggests that most COVID-19 illness is mild, serious illness can happen and may cause some of your other medical conditions to become worse. Older people and people of all ages with severe, long lasting (chronic) medical conditions like heart disease, lung disease and diabetes, for example seem to be at higher risk of being hospitalized for COVID-19.  What is LAGEVRIO? LAGEVRIO is an investigational medicine used to treat mild-to-moderate COVID-19 in adults: ? with positive results of direct SARS-CoV-2 viral testing, and ? who are at high risk for progression to severe COVID-19 including hospitalization or death, and for whom other COVID-19 treatment options approved or authorized by the FDA are not accessible  or clinically appropriate. The FDA has authorized the emergency use of LAGEVRIO for the treatment of mild-tomoderate COVID-19 in adults under an EUA. For more information on EUA, see the "What is an Emergency Use Authorization (EUA)?" section at the end of this Fact Sheet. LAGEVRIO is not authorized: ? for use in people less than 53 years of age. ? for prevention of COVID-19. ? for people needing hospitalization for COVID-19. ? for use for longer than 5 consecutive days.  What should I tell my healthcare provider before I take LAGEVRIO? Tell your healthcare provider if you: ? Have  any allergies ? Are breastfeeding or plan to breastfeed ? Have any serious illnesses ? Are taking any medicines (prescription, over-the-counter, vitamins, or herbal products).  How do I take LAGEVRIO? ? Take LAGEVRIO exactly as your healthcare provider tells you to take it. ? Take 4 capsules of LAGEVRIO every 12 hours (for example, at 8 am and at 8 pm) ? Take LAGEVRIO for 5 days. It is important that you complete the full 5 days of treatment with LAGEVRIO. Do not stop taking LAGEVRIO before you complete the full 5 days of treatment, even if you feel better. ? Take LAGEVRIO with or without food. ? You should stay in isolation for as long as your healthcare provider tells you to. Talk to your healthcare provider if you are not sure about how to properly isolate while you have COVID-19. ? Swallow LAGEVRIO capsules whole. Do not open, break, or crush the capsules. If you cannot swallow capsules whole, tell your healthcare provider. ? What to do if you miss a dose: o If it has been less than 10 hours since the missed dose, take it as soon as you remember o If it has been more than 10 hours since the missed dose, skip the missed dose and take your dose at the next scheduled time. ? Do not double the dose of LAGEVRIO to make up for a missed dose.  What are the important possible side effects of LAGEVRIO? ? See, "What is the most important information I should know about LAGEVRIO?" ? Allergic Reactions. Allergic reactions can happen in people taking LAGEVRIO, even after only 1 dose. Stop taking LAGEVRIO and call your healthcare provider right away if you get any of the following symptoms of an allergic reaction: o hives o rapid heartbeat o trouble swallowing or breathing o swelling of the mouth, lips, or face o throat tightness o hoarseness o skin rash The most common side effects of LAGEVRIO are: ? diarrhea ? nausea ? dizziness These are not all the possible side effects of LAGEVRIO.  Not many people have taken LAGEVRIO. Serious and unexpected side effects may happen. This medicine is still being studied, so it is possible that all of the risks are not known at this time.  What other treatment choices are there?  Veklury (remdesivir) is FDA-approved as an intravenous (IV) infusion for the treatment of mildto-moderate NTIRW-43 in certain adults and children. Talk with your doctor to see if Marijean Heath is appropriate for you. Like LAGEVRIO, FDA may also allow for the emergency use of other medicines to treat people with COVID-19. Go to LacrosseProperties.si for more information. It is your choice to be treated or not to be treated with LAGEVRIO. Should you decide not to take it, it will not change your standard medical care.  What if I am breastfeeding? Breastfeeding is not recommended during treatment with LAGEVRIO and for 4 days after the last dose of  LAGEVRIO. If you are breastfeeding or plan to breastfeed, talk to your healthcare provider about your options and specific situation before taking LAGEVRIO.  How do I report side effects with LAGEVRIO? Contact your healthcare provider if you have any side effects that bother you or do not go away. Report side effects to FDA MedWatch at SmoothHits.hu or call 1-800-FDA-1088 (1- 660-524-9558).  How should I store [place]? ? Store LAGEVRIO capsules at room temperature between 69F to 50F (20C to 25C). ? Keep LAGEVRIO and all medicines out of the reach of children and pets. How can I learn more about COVID-19? ? Ask your healthcare provider. ? Visit SeekRooms.co.uk ? Contact your local or state public health department. ? Call [place] at [phone] (toll free in the [place] ? Visit www.molnupiravir.com  What Is an Emergency Use Authorization (EUA)? The [place] FDA has made [place]  available under an emergency access mechanism called an Emergency Use Authorization (EUA) The EUA is supported by a Presenter, broadcasting Health and Human Service (HHS) declaration that circumstances exist to justify emergency use of drugs and biological products during the COVID-19 pandemic. LAGEVRIO for the treatment of mild-to-moderate COVID-19 in adults with positive results of direct SARS-CoV-2 viral testing, who are at high risk for progression to severe COVID-19, including hospitalization or death, and for whom alternative COVID-19 treatment options approved or authorized by FDA are not accessible or clinically appropriate, has not undergone the same type of review as an FDA-approved product. In issuing an EUA under the DHDIX-78 public health emergency, the FDA has determined, among other things, that based on the total amount of scientific evidence available including data from adequate and well-controlled clinical trials, if available, it is reasonable to believe that the product may be effective for diagnosing, treating, or preventing COVID-19, or a serious or life-threatening disease or condition caused by COVID-19; that the known and potential benefits of the product, when used to diagnose, treat, or prevent such disease or condition, outweigh the known and potential risks of such product; and that there are no adequate, approved, and available alternatives.  All of these criteria must be met to allow for the product to be used in the treatment of patients during the COVID-19 pandemic. The EUA for LAGEVRIO is in effect for the duration of the COVID-19 declaration justifying emergency use of LAGEVRIO, unless terminated or revoked (after which LAGEVRIO may no longer be used under the EUA). For patent information: http://rogers.[email or web address] Copyright  2021-2022 [place] [place], [place] and its affiliates. All rights reserved. [id number] Revised: March 2022

## 2021-03-09 NOTE — Progress Notes (Addendum)
Virtual Visit via Video Note  I connected with Isley  on 03/09/21 at 11:00 AM EDT by a video enabled telemedicine application and verified that I am speaking with the correct person using two identifiers.  Location patient: home, [place] Location provider:work or home office Persons participating in the virtual visit: patient, provider, patient's duaghter  I discussed the limitations of evaluation and management by telemedicine and the availability of in person appointments. The patient expressed understanding and agreed to proceed.   HPI:  Acute telemedicine visit for Covid19: -Onset: 2-3 days ago symptoms started; tested positive for covid, daughter positive too -Symptoms include: nasal congestion, sore throat, cough, mild ha -Denies: fever, CP, SOB, NVD, inability to eat/drink/get out of bed -Has tried: tylenol -Pertinent past medical history: see below -Pertinent medication allergies: No Known Allergies -COVID-19 vaccine status: 2 doses and a booster  ROS: See pertinent positives and negatives per HPI.  Past Medical History:  Diagnosis Date   Allergy    Benign paroxysmal positional vertigo 06/25/2013   dx with prior PCP, s/p labs, mri and vestibular rehab; nasal spray for etd seemed to help   Blood in stool 03/11/2012   Dependent edema    Bilateral   Depression    GERD (gastroesophageal reflux disease)    Hyperlipidemia    Low back pain    sciatica; sees orthopedic specialist ([facility])for this and shoulder tendinitis   Obesity, diabetes, and hypertension syndrome ([place])    Osteoarthritis, knee    Bilateral, s/p 3 total knee replacement surgerues on right, last 2005. Dr. Tonita Cong   Pellegrini-Steida syndrome    Myositis ossificans   Seasonal allergies    Shingles Jul 10 2012   LESIONS CLEARED BUT NOT TOLERATING ANY THING TIGHT AGAINST BODY--SHINGLES WERE AROSS BACK AND ABDOMEN    Past Surgical History:  Procedure Laterality Date   ABDOMINAL HYSTERECTOMY  1984    BLADDER SURGERY  03/2017   bladder tac per pt   COLONOSCOPY     left shoulder steroid injection     POLYPECTOMY     TONSILLECTOMY  1977   TOTAL HIP ARTHROPLASTY Right 10/10/2012   Procedure: R TOTAL HIP ARTHROPLASTY ANTERIOR APPROACH;  Surgeon: Mcarthur Rossetti, MD;  Location: [facility];  Service: Orthopedics;  Laterality: Right;  Right Total Hip Arthroplasty, Anterior Approach (C-Arm)   TOTAL KNEE ARTHROPLASTY Right    with multiple revisions, 2003, 2005   TOTAL KNEE ARTHROPLASTY Left 01/09/2013   Procedure: LEFT TOTAL KNEE ARTHROPLASTY;  Surgeon: Mcarthur Rossetti, MD;  Location: [facility];  Service: Orthopedics;  Laterality: Left;   TUBAL LIGATION  1978   WISDOM TOOTH EXTRACTION  2013,2014     Current Outpatient Medications:    Accu-Chek Softclix Lancets lancets, daily. for testing as directed, Disp: , Rfl:    acetaminophen (TYLENOL) 650 MG CR tablet, Take by mouth., Disp: , Rfl:    benzonatate (TESSALON PERLES) 100 MG capsule, Take 1 capsule (100 mg total) by mouth 3 (three) times daily as needed., Disp: 20 capsule, Rfl: 0   Blood Glucose Monitoring Suppl (ONETOUCH VERIO IQ SYSTEM) W/DEVICE KIT, by Does not apply route., Disp: , Rfl:    CALCIUM PO, Take by mouth daily., Disp: , Rfl:    cetirizine (ZYRTEC) 10 MG tablet, Take 10 mg by mouth at bedtime., Disp: , Rfl:    Cyanocobalamin (B-12 PO), Take by mouth daily., Disp: , Rfl:    cyclobenzaprine (FLEXERIL) 5 MG tablet, Take 1 tablet (5 mg total) by mouth at  bedtime as needed for muscle spasms., Disp: 30 tablet, Rfl: 1   ferrous gluconate (FERGON) 324 MG tablet, Take 1 tablet (324 mg total) by mouth every other day., Disp: 30 tablet, Rfl: 3   fluticasone (FLONASE) 50 MCG/ACT nasal spray, Place 1 spray into both nostrils daily., Disp: 16 g, Rfl: 3   furosemide (LASIX) 40 MG tablet, TAKE 1/2 TABLET(20 MG) BY MOUTH DAILY, Disp: 30 tablet, Rfl: 5   gabapentin (NEURONTIN) 300 MG capsule, Take 300 mg by mouth 4 (four) times daily., Disp:  , Rfl:    ketotifen (ZADITOR) 0.025 % ophthalmic solution, Place 1 drop into both eyes as needed (ALLERGIES)., Disp: , Rfl:    lisinopril (ZESTRIL) 20 MG tablet, TAKE 1 TABLET BY MOUTH EVERY DAY, Disp: 90 tablet, Rfl: 3   metFORMIN (GLUCOPHAGE) 1000 MG tablet, TAKE 1 TABLET BY MOUTH TWICE DAILY WITH A MEAL, Disp: 180 tablet, Rfl: 3   molnupiravir EUA 200 mg CAPS, Take 4 capsules (800 mg total) by mouth 2 (two) times daily for 5 days., Disp: 40 capsule, Rfl: 0   omeprazole (PRILOSEC) 10 MG capsule, Take 10 mg by mouth daily as needed., Disp: , Rfl:    ONETOUCH VERIO test strip, USE DAILY AS DIRECTED, Disp: 100 strip, Rfl: 1   potassium chloride SA (KLOR-CON) 20 MEQ tablet, TAKE 1 TABLET(20 MEQ) BY MOUTH DAILY, Disp: 90 tablet, Rfl: 0   traMADol (ULTRAM) 50 MG tablet, Take 1 tablet (50 mg total) by mouth every 8 (eight) hours as needed., Disp: 40 tablet, Rfl: 0   VITAMIN D PO, Take by mouth daily., Disp: , Rfl:   EXAM:  VITALS per patient if applicable:  GENERAL: alert, oriented, appears well and in no acute distress  HEENT: atraumatic, conjunttiva clear, no obvious abnormalities on inspection of external nose and ears  NECK: normal movements of the head and neck  LUNGS: on inspection no signs of respiratory distress, breathing rate appears normal, no obvious gross SOB, gasping or wheezing  CV: no obvious cyanosis  MS: moves all visible extremities without noticeable abnormality  PSYCH/NEURO: pleasant and cooperative, no obvious depression or anxiety, speech and thought processing grossly intact  ASSESSMENT AND PLAN:  Discussed the following assessment and plan:  COVID-19   Discussed treatment options, ideal treatment window, potential complications, isolation and precautions for COVID-19.  After lengthy discussion, the patient opted for treatment with molnupiravir due to being higher risk for complications of covid or severe disease and other factors. Discussed EUA status of this  drug and the fact that there is preliminary limited knowledge of risks/interactions/side effects per EUA document vs possible benefits and precautions. This information was shared with patient during the visit and also was provided in patient instructions. Also, advised that patient discuss risks/interactions and use with pharmacist/treatment team as well. The patient did want a prescription for cough, Tessalon Rx sent.  Other symptomatic care measures summarized in patient instructions.  Advised to seek prompt in person care if worsening, new symptoms arise, or if is not improving with treatment. Discussed options for inperson care if PCP office not available. Did let this patient know that I only do telemedicine on Tuesdays and Thursdays for [place]. Advised to schedule follow up visit with PCP or [facility] if any further questions or concerns to avoid delays in care.   I discussed the assessment and treatment plan with the patient. The patient was provided an opportunity to ask questions and all were answered. The patient agreed with the plan and  demonstrated an understanding of the instructions.     Lucretia Kern, DO

## 2021-03-10 NOTE — Telephone Encounter (Signed)
Pt was called. She is already doing this. She just wanted [place] to know

## 2021-03-13 ENCOUNTER — Other Ambulatory Visit: Payer: Self-pay | Admitting: Family Medicine

## 2021-03-20 ENCOUNTER — Encounter: Payer: Self-pay | Admitting: Orthopaedic Surgery

## 2021-03-20 ENCOUNTER — Ambulatory Visit (INDEPENDENT_AMBULATORY_CARE_PROVIDER_SITE_OTHER): Payer: HMO

## 2021-03-20 ENCOUNTER — Other Ambulatory Visit: Payer: Self-pay

## 2021-03-20 ENCOUNTER — Ambulatory Visit: Payer: HMO | Admitting: Orthopaedic Surgery

## 2021-03-20 DIAGNOSIS — S8264XD Nondisplaced fracture of lateral malleolus of right fibula, subsequent encounter for closed fracture with routine healing: Secondary | ICD-10-CM | POA: Diagnosis not present

## 2021-03-20 DIAGNOSIS — S8264XA Nondisplaced fracture of lateral malleolus of right fibula, initial encounter for closed fracture: Secondary | ICD-10-CM | POA: Diagnosis not present

## 2021-03-20 MED ORDER — TRAMADOL HCL 50 MG PO TABS: 50.0000 mg | ORAL_TABLET | Freq: Three times a day (TID) | ORAL | 0 refills | Status: DC | PRN

## 2021-03-20 NOTE — Progress Notes (Signed)
The patient is just over 2 weeks into a nondisplaced right ankle lateral malleolus fracture.  She has been in a cam walking boot.  She is an active 70 year old female.  She has been weightbearing as tolerated and using a walker.  She still has some ankle swelling and is painful with walking but it is feeling better overall.  She has otherwise had no acute change in her medical status.  Examination of her right ankle shows stiffness of the ankle with some lateral pain and swelling.  3 views of the right ankle show an intact ankle mortise with soft tissue swelling laterally.  There is a minimal fracture of the lateral malleolus that is nondisplaced and shows interval healing.  We will transition her to an ASO at this standpoint for her right ankle.  All questions and concerns were answered addressed.  We will see her back in 3 weeks with a repeat mortise view only of the right ankle for single view.  We can then initiate physical therapy if she is not making progress.

## 2021-04-10 ENCOUNTER — Other Ambulatory Visit: Payer: Self-pay

## 2021-04-10 ENCOUNTER — Ambulatory Visit (INDEPENDENT_AMBULATORY_CARE_PROVIDER_SITE_OTHER): Payer: HMO

## 2021-04-10 ENCOUNTER — Ambulatory Visit: Payer: HMO | Admitting: Orthopaedic Surgery

## 2021-04-10 ENCOUNTER — Encounter: Payer: Self-pay | Admitting: Orthopaedic Surgery

## 2021-04-10 DIAGNOSIS — S8264XD Nondisplaced fracture of lateral malleolus of right fibula, subsequent encounter for closed fracture with routine healing: Secondary | ICD-10-CM

## 2021-04-10 MED ORDER — TRAMADOL HCL 50 MG PO TABS: 50.0000 mg | ORAL_TABLET | Freq: Three times a day (TID) | ORAL | 0 refills | Status: DC | PRN

## 2021-04-10 NOTE — Progress Notes (Signed)
The patient is now past 5 weeks status post a nondisplaced right ankle lateral malleolus fracture.  She is using a cane to ambulate and is wearing an ASO.  She is 70 years old.  She feels like it is getting better.  She is requesting a refill of tramadol and does report swelling.  She does have daycare at home so she is not able to do physical therapy.  Examination of her right ankle does still show swelling to be expected.  The pain is only minimal.  The right ankle is well located but is very stiff throughout his arc of motion.  I did recommend outpatient physical therapy but she said she would not be able to go.  I showed her wanted to try to work on stretching her ankle.  She will continue the ASO.  I did refill her tramadol.  I will see her back in 4 weeks with a mortise and lateral only of the right ankle.

## 2021-05-02 ENCOUNTER — Other Ambulatory Visit: Payer: Self-pay

## 2021-05-03 ENCOUNTER — Encounter: Payer: Self-pay | Admitting: Family Medicine

## 2021-05-03 ENCOUNTER — Ambulatory Visit (INDEPENDENT_AMBULATORY_CARE_PROVIDER_SITE_OTHER): Payer: HMO | Admitting: Family Medicine

## 2021-05-03 VITALS — BP 128/78 | HR 70 | Temp 98.5°F | Wt 171.0 lb

## 2021-05-03 DIAGNOSIS — Z23 Encounter for immunization: Secondary | ICD-10-CM

## 2021-05-03 DIAGNOSIS — R5381 Other malaise: Secondary | ICD-10-CM

## 2021-05-03 DIAGNOSIS — I10 Essential (primary) hypertension: Secondary | ICD-10-CM

## 2021-05-03 DIAGNOSIS — E1149 Type 2 diabetes mellitus with other diabetic neurological complication: Secondary | ICD-10-CM

## 2021-05-03 DIAGNOSIS — S8264XD Nondisplaced fracture of lateral malleolus of right fibula, subsequent encounter for closed fracture with routine healing: Secondary | ICD-10-CM

## 2021-05-03 LAB — POCT GLYCOSYLATED HEMOGLOBIN (HGB A1C): Hemoglobin A1C: 6.1 % — AB (ref 4.0–5.6)

## 2021-05-03 NOTE — Patient Instructions (Signed)
Today your Hemoglobin A1C was 6.1% this visit.  It was 6.9% on 06/27/2020.

## 2021-05-03 NOTE — Progress Notes (Signed)
Subjective:    Patient ID: Renee Watts, female    DOB: 05-03-1951, 70 y.o.   MRN: [id number]  Chief Complaint  Patient presents with   Follow-up    DM    HPI Patient was seen today for f/u.  Pt has to reschedule previous OFV 2/2 R lateral malleolus fx in July.  Pt states no longer having pain in R ankle, but notices fatigue with moving around and edema in R ankle.  Has appointment with Ortho on Monday.  Patient trying to take it easy.  Has not been driving since June.  Was doing water aerobics but has not restarted since her ankle injury.  Notes fatigue since COVID a few months ago.  Patient states blood sugar has been okay.  Requesting refills on needles.  Trying to eat better.  Has fried fish 3x/wk.    Past Medical History:  Diagnosis Date   Allergy    Benign paroxysmal positional vertigo 06/25/2013   dx with prior PCP, s/p labs, mri and vestibular rehab; nasal spray for etd seemed to help   Blood in stool 03/11/2012   Dependent edema    Bilateral   Depression    GERD (gastroesophageal reflux disease)    Hyperlipidemia    Low back pain    sciatica; sees orthopedic specialist ([facility])for this and shoulder tendinitis   Obesity, diabetes, and hypertension syndrome ([place])    Osteoarthritis, knee    Bilateral, s/p 3 total knee replacement surgerues on right, last 2005. Dr. Tonita Cong   Pellegrini-Steida syndrome    Myositis ossificans   Seasonal allergies    Shingles Jul 10 2012   LESIONS CLEARED BUT NOT TOLERATING ANY THING TIGHT AGAINST BODY--SHINGLES WERE AROSS BACK AND ABDOMEN    No Known Allergies  ROS General: Denies fever, chills, night sweats, changes in weight, changes in appetite +fatigue HEENT: Denies headaches, ear pain, changes in vision, rhinorrhea, sore throat CV: Denies CP, palpitations, SOB, orthopnea Pulm: Denies SOB, cough, wheezing GI: Denies abdominal pain, nausea, vomiting, diarrhea, constipation GU: Denies dysuria, hematuria, frequency, vaginal  discharge Msk: Denies muscle cramps, joint pains  +R ankle edema Neuro: Denies weakness, numbness, tingling Skin: Denies rashes, bruising Psych: Denies depression, anxiety, hallucinations    Objective:    Blood pressure 128/78, pulse 70, temperature 98.5 F (36.9 C), temperature source Oral, weight 171 lb (77.6 kg), SpO2 95 %.  Gen. Pleasant, well-nourished, in no distress, normal affect   HEENT: [place]/AT, face symmetric, conjunctiva clear, no scleral icterus, PERRLA, EOMI, nares patent without drainage Lungs: no accessory muscle use, CTAB, no wheezes or rales Cardiovascular: RRR, no m/r/g, 1+ edema in right foot and ankle.  Trace edema in right shin and left shin and ankle. Abdomen: BS present, soft, NT/ND, no hepatosplenomegaly. Musculoskeletal: Wearing a lace up ankle support.  1+ edema in R foot and ankle. No deformities, no cyanosis or clubbing, normal tone Neuro:  A&Ox3, CN II-XII intact, ambulating with cane. Skin:  Warm, no lesions/ rash   Wt Readings from Last 3 Encounters:  05/03/21 171 lb (77.6 kg)  03/02/21 156 lb 6.4 oz (70.9 kg)  12/28/20 156 lb 6.4 oz (70.9 kg)    Lab Results  Component Value Date   WBC 5.5 06/27/2020   HGB 12.7 06/27/2020   HCT 38.3 06/27/2020   PLT 148 06/27/2020   GLUCOSE 181 (H) 06/27/2020   CHOL 205 (H) 06/27/2020   TRIG 132 06/27/2020   HDL 39 (L) 06/27/2020   LDLDIRECT 137.8 10/12/2013  LDLCALC 140 (H) 06/27/2020   ALT 27 06/27/2020   AST 21 06/27/2020   NA 140 06/27/2020   K 4.4 06/27/2020   CL 105 06/27/2020   CREATININE 0.42 (L) 06/27/2020   BUN 19 06/27/2020   CO2 29 06/27/2020   TSH 1.20 05/10/2015   INR 0.94 01/05/2013   HGBA1C 6.9 (H) 06/27/2020   MICROALBUR 0.5 03/11/2014    Assessment/Plan:  Essential hypertension -Controlled -Continue lisinopril 20 mg -Continue lifestyle modifications  Closed nondisplaced fracture of lateral malleolus of right fibula with routine healing, subsequent  encounter -Healing -Discussed ankle ROM exercises -Continue elevation, compression, ice as needed -Continue follow-up with Ortho  Need for influenza vaccination -Influenza vaccine given this visit  Physical deconditioning -Likely 2/2 ankle fracture and history of COVID infection 2 months ago. -Discussed increasing physical activity as tolerated  Type II diabetes mellitus with neurological manifestations (HCC) -Hemoglobin A1c 6.9% on 06/27/2020 -Eye exam done 11/25/2020 -foot exam -Continue metformin 1000 mg twice daily -We will refill needles.  Patient to check to see which ones she needs and notify clinic. -Plan: Hemoglobin A1c  F/u in 2-3 months for CPE  Grier Mitts, MD

## 2021-05-05 ENCOUNTER — Telehealth: Payer: Self-pay | Admitting: Family Medicine

## 2021-05-05 MED ORDER — ONETOUCH DELICA LANCETS 30G MISC: 2 refills | Status: DC

## 2021-05-05 NOTE — Telephone Encounter (Signed)
Sent to pharmacy 

## 2021-05-05 NOTE — Telephone Encounter (Signed)
Patient says that she was told to call Dr. Volanda Napoleon and tell her which needles she uses to check her blood sugar. She says the needles are "One Touch Delica".

## 2021-05-08 ENCOUNTER — Ambulatory Visit: Payer: HMO | Admitting: Orthopaedic Surgery

## 2021-05-08 ENCOUNTER — Other Ambulatory Visit: Payer: Self-pay

## 2021-05-08 ENCOUNTER — Encounter: Payer: Self-pay | Admitting: Orthopaedic Surgery

## 2021-05-08 ENCOUNTER — Ambulatory Visit (INDEPENDENT_AMBULATORY_CARE_PROVIDER_SITE_OTHER): Payer: HMO

## 2021-05-08 DIAGNOSIS — S8264XD Nondisplaced fracture of lateral malleolus of right fibula, subsequent encounter for closed fracture with routine healing: Secondary | ICD-10-CM | POA: Diagnosis not present

## 2021-05-08 NOTE — Progress Notes (Signed)
The patient is a 70 year old female we been following for a left nondisplaced lateral malleolus fracture of the right ankle.  She has been still wearing a flip-flop and using an ASO.  She ambulates with a cane and reports still significant ankle stiffness and swelling.  She has been having some right thigh and left knee pain mainly from overcompensating.  She has not driven either.  On exam she still has abundant ankle swelling and stiffness on range of motion on the right side.  The skin is intact.  Her foot is well-perfused.  2 views of the right ankle show the lateral malleus fracture is healed completely.  The ankle mortise is intact.  There is some disuse osteopenia.  I gave her reassurance that the fracture itself is healed.  The swelling still can take months to get less.  I recommended compressive hose.  Also feel that she would benefit the most from physical therapy to work on range of motion of her right ankle as well as edema reduction and strengthening of the ankle which can help her balance and coordination in general.  She agreed to have this set up so we will try [facility] outpatient rehab on [address] is where she prefers.  I will then see her back in 4 weeks to see how she is doing overall but no x-rays are needed.

## 2021-05-12 ENCOUNTER — Other Ambulatory Visit: Payer: Self-pay | Admitting: Orthopaedic Surgery

## 2021-05-12 ENCOUNTER — Telehealth: Payer: Self-pay

## 2021-05-12 MED ORDER — TRAMADOL HCL 50 MG PO TABS: 50.0000 mg | ORAL_TABLET | Freq: Three times a day (TID) | ORAL | 0 refills | Status: DC | PRN

## 2021-05-12 NOTE — Telephone Encounter (Signed)
Pt called and would like a refill of her tramadol

## 2021-05-12 NOTE — Telephone Encounter (Signed)
Please advise 

## 2021-05-19 ENCOUNTER — Other Ambulatory Visit: Payer: Self-pay

## 2021-05-19 ENCOUNTER — Ambulatory Visit: Payer: HMO | Attending: Orthopaedic Surgery

## 2021-05-19 DIAGNOSIS — M6281 Muscle weakness (generalized): Secondary | ICD-10-CM | POA: Diagnosis not present

## 2021-05-19 DIAGNOSIS — R2681 Unsteadiness on feet: Secondary | ICD-10-CM | POA: Insufficient documentation

## 2021-05-19 DIAGNOSIS — M25571 Pain in right ankle and joints of right foot: Secondary | ICD-10-CM | POA: Diagnosis not present

## 2021-05-19 DIAGNOSIS — M25671 Stiffness of right ankle, not elsewhere classified: Secondary | ICD-10-CM | POA: Diagnosis not present

## 2021-05-19 NOTE — Patient Instructions (Signed)
   LKLWEAHV

## 2021-05-19 NOTE — Therapy (Signed)
[place], [place], [address] Phone: [phone]   Fax:  [phone]  Physical Therapy Evaluation  Patient Details  Name: Renee Watts MRN: [id number] Date of Birth: 1951/03/01 Referring Provider (PT): Mcarthur Rossetti, MD   Encounter Date: 05/19/2021   PT End of Session - 05/19/21 0854     Visit Number 1    Number of Visits 9    Date for PT Re-Evaluation 07/14/21    Authorization Type [employer]    Authorization Time Period FOTO v6, v10    PT Start Time 0745    PT Stop Time 0845    PT Time Calculation (min) 60 min    Equipment Utilized During Treatment Gait belt    Activity Tolerance Patient tolerated treatment well;Patient limited by pain    Behavior During Therapy [facility] for tasks assessed/performed             Past Medical History:  Diagnosis Date   Allergy    Benign paroxysmal positional vertigo 06/25/2013   dx with prior PCP, s/p labs, mri and vestibular rehab; nasal spray for etd seemed to help   Blood in stool 03/11/2012   Dependent edema    Bilateral   Depression    GERD (gastroesophageal reflux disease)    Hyperlipidemia    Low back pain    sciatica; sees orthopedic specialist ([facility])for this and shoulder tendinitis   Obesity, diabetes, and hypertension syndrome ([place])    Osteoarthritis, knee    Bilateral, s/p 3 total knee replacement surgerues on right, last 2005. Dr. Tonita Cong   Pellegrini-Steida syndrome    Myositis ossificans   Seasonal allergies    Shingles Jul 10 2012   LESIONS CLEARED BUT NOT TOLERATING ANY THING TIGHT AGAINST BODY--SHINGLES WERE AROSS BACK AND ABDOMEN    Past Surgical History:  Procedure Laterality Date   ABDOMINAL HYSTERECTOMY  1984   BLADDER SURGERY  03/2017   bladder tac per pt   COLONOSCOPY     left shoulder steroid injection     POLYPECTOMY     TONSILLECTOMY  1977   TOTAL HIP ARTHROPLASTY Right 10/10/2012   Procedure: R TOTAL  HIP ARTHROPLASTY ANTERIOR APPROACH;  Surgeon: Mcarthur Rossetti, MD;  Location: [facility];  Service: Orthopedics;  Laterality: Right;  Right Total Hip Arthroplasty, Anterior Approach (C-Arm)   TOTAL KNEE ARTHROPLASTY Right    with multiple revisions, 2003, 2005   TOTAL KNEE ARTHROPLASTY Left 01/09/2013   Procedure: LEFT TOTAL KNEE ARTHROPLASTY;  Surgeon: Mcarthur Rossetti, MD;  Location: [facility];  Service: Orthopedics;  Laterality: Left;   TUBAL LIGATION  1978   WISDOM TOOTH EXTRACTION  2013,2014    There were no vitals filed for this visit.    Subjective Assessment - 05/19/21 0743     Subjective Pt reports primary c/o chronic R ankle pain and stiffness s/p a lateral malleolar fx sustained in a fall on 03/01/2021. Pt reports walking through the doorway to her bathroom when both of her knees buckled and she fell forward onto the floor. She immediately knew that something was wrong with her R ankle. She reports that her doctors decided against surgery and that from the most recent reports, her ankle is heeling well, although she has residual pain/ stiffness/ swelling at the joint. Current pain = 7/10, worst = 9/10, best = 3/10. Aggravating factors include prolonged standing/ walking. Easing factors include ice, elevation, compression, and pain medication. Pt denies any unexplained weight loss/ gain,  nausea/ vomiting, unrelenting night pain, change in bowel or bladder function, or N/T (besides her typical diabetic foot neuropathy). Pt also reports pain and weakness along her hip flexors since her fall.    Limitations Standing;Walking;House hold activities    How long can you sit comfortably? Unlimited    How long can you stand comfortably? 5-10 minutes    How long can you walk comfortably? 1 hour    Diagnostic tests 05/08/2021: R Ankle XR: IMPRESSION: 2 views of the right ankle show a healed lateral malleolus fracture.     There is some disuse osteopenia.  The ankle mortise is intact.    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 Yes    Pain Score 7     Pain Location Ankle    Pain Orientation Right    Pain Descriptors / Indicators Nagging    Pain Type Chronic pain    Pain Onset More than a month ago    Pain Frequency Intermittent    Aggravating Factors  Prolonged walking, standing    Pain Relieving Factors ice, elevation, compression, pain medication    Effect of Pain on Daily Activities See patient goals    Multiple Pain Sites No                [facility] PT Assessment - 05/19/21 0001       Assessment   Medical Diagnosis Closed nondisplaced fracture of lateral malleolus of right fibula with routine healing, subsequent encounter (M84.13KG)    Referring Provider (PT) Mcarthur Rossetti, MD    Onset Date/Surgical Date 03/01/21    Hand Dominance Right    Next MD Visit Orthopedic visit on 06/07/2021    Prior Therapy A long time ago for knees      Precautions   Precautions None      Restrictions   Weight Bearing Restrictions No      Balance Screen   Has the patient fallen in the past 6 months Yes    How many times? 1   Related to current injury   Has the patient had a decrease in activity level because of a fear of falling?  Yes    Is the patient reluctant to leave their home because of a fear of falling?  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    Leisure Walking, shopping      Cognition   Overall Cognitive Status Within  Functional Limits for tasks assessed      Observation/Other Assessments   Observations Pt ambulates with significant R forefoot eversion during all phases of gait    Focus on Therapeutic Outcomes (FOTO)  57%, projected 68% in 12 visits      Observation/Other Assessments-Edema    Edema Figure 8      Figure 8 Edema   Figure 8 - Right  54cm  Figure 8 - Left  51.5cm      Functional Tests   Functional tests Squat;Single leg stance;Step up;Step down      AROM   Right Ankle Dorsiflexion -18    Right Ankle Plantar Flexion 20    Right Ankle Inversion 5    Right Ankle Eversion 2    Left Ankle Dorsiflexion -12    Left Ankle Plantar Flexion 24    Left Ankle Inversion 18    Left Ankle Eversion 5      PROM   Right Ankle Dorsiflexion -8    Right Ankle Plantar Flexion 24   pain   Right Ankle Inversion 24   pain   Right Ankle Eversion 14    Left Ankle Dorsiflexion 0    Left Ankle Plantar Flexion 26    Left Ankle Inversion 25    Left Ankle Eversion 10      Strength   Right Hip Flexion 3+/5   pain along hip flexors   Right Hip Extension 3-/5    Right Hip ABduction 3/5    Left Hip Flexion 3+/5    Left Hip Extension 3/5    Left Hip ABduction 3-/5    Right Knee Flexion 5/5    Right Knee Extension 4/5   pain along quadriceps muscle belly   Left Knee Flexion 5/5    Left Knee Extension 4+/5    Right Ankle Dorsiflexion 3+/5   pain   Right Ankle Plantar Flexion 3/5   pain   Right Ankle Inversion 2/5   pain   Right Ankle Eversion 2/5   pain   Left Ankle Dorsiflexion 4+/5    Left Ankle Plantar Flexion 5/5    Left Ankle Inversion 4/5    Left Ankle Eversion 4/5      Flexibility   Soft Tissue Assessment /Muscle Length yes   Severely limited BIL gastroc and soleus extensibility   Quadriceps Severely limited BIL, (+) Ely's test BIL, painful on R      Palpation   Patella mobility TTP to R lateral ATFL, CFL      Transfers   Five time sit to stand comments  55 seconds using arms of chair  to lift up      Standardized Balance Assessment   Standardized Balance Assessment Berg Balance Test      Berg Balance Test   Sit to Stand Able to stand  independently using hands    Standing Unsupported Able to stand safely 2 minutes    Sitting with Back Unsupported but Feet Supported on Floor or Stool Able to sit safely and securely 2 minutes    Stand to Sit Controls descent by using hands    Transfers Able to transfer safely, definite need of hands    Standing Unsupported with Eyes Closed Able to stand 10 seconds with supervision    Standing Unsupported with Feet Together Able to place feet together independently and stand for 1 minute with supervision    From Standing, Reach Forward with Outstretched Arm Can reach forward >12 cm safely (5")    From Standing Position, Pick up Object from Floor Able to pick up shoe, needs supervision    From Standing Position, Turn to Look Behind Over each Shoulder Needs assist to keep from losing balance and falling    Turn 360 Degrees Needs assistance while turning    Standing Unsupported, Alternately Place Feet on Step/Stool Needs assistance to keep from falling or unable to try    Standing  Unsupported, One Foot in [employer] balance while stepping or standing    Standing on One Leg Unable to try or needs assist to prevent fall    Total Score 29    Berg comment: High fall risk      Functional Gait  Assessment   Gait assessed  No                        Objective measurements completed on examination: See above findings.       [place] Adult PT Treatment/Exercise - 05/19/21 0001       Ankle Exercises: Stretches   Gastroc Stretch 1 rep;60 seconds    Gastroc Stretch Limitations Seated with strap      Ankle Exercises: Seated   ABC's 1 rep    Other Seated Ankle Exercises banded inversion, eversion, and DF with YTB x10 each                     PT Education - 05/19/21 0853     Education Details Pt educated on proper  form with HEP, as well as POC and prognosis based on objective findings    Person(s) Educated Patient    Methods Explanation;Handout    Comprehension Returned demonstration;Verbalized understanding              PT Short Term Goals - 05/19/21 0907       PT SHORT TERM GOAL #1   Title Pt will report understanding and regular adherence to her HEP in order to promote independence in the management of her primary sxs.    Baseline HEP provided at eval    Time 4    Period Weeks    Status New    Target Date 06/16/21      PT SHORT TERM GOAL #2   Title Pt will demonstrate a 2 cm decrease in R ankle figure 8 measurement in order to promote improved functional ankle AROM.    Baseline 54cm    Time 4    Period Weeks    Status New    Target Date 06/16/21      PT SHORT TERM GOAL #3   Title -    Baseline -      PT SHORT TERM GOAL #4   Title -    Baseline -               PT Long Term Goals - 05/19/21 0909       PT LONG TERM GOAL #1   Title Pt will achieve a FOTO score of 68% in order to demonstrate improved functional ability as it relates to her R ankle sxs.    Baseline 57%    Time 8    Period Weeks    Status New    Target Date 07/14/21      PT LONG TERM GOAL #2   Title Pt will demonstrate improved BIL ankle DF AROM to 0 degrees or higher in order to ambulate with normal gait.    Baseline -18 degrees on R, -12 degrees on L    Time 8    Period Weeks    Status New    Target Date 07/14/21      PT LONG TERM GOAL #3   Title Pt will achieve R ankle plantarflexion strength of 5/5 in order to walk while grocery shopping without limitation.    Baseline 3+/5    Time 8    Period Weeks  Status New    Target Date 07/14/21      PT LONG TERM GOAL #4   Title Pt will report ability to stand >20 minutes with 0-3/10 pain in order to wash dishes without limitation.    Baseline Unable to stand longer than 5-10 minutes without >7/10 pain    Time 8    Period Weeks    Status New     Target Date 07/14/21      PT LONG TERM GOAL #5   Title Pt will improve BERG balance score to 40 or higher in order to progress to household ambulation without the need of an assistive device.    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    Baseline 55 seconds using chair arms for ascent    Time 8    Period Weeks    Status New    Target Date 07/14/21                    Plan - 05/19/21 0855     Clinical Impression Statement Pt is a pleasant 70yo F who presents with primary c/o chronic R ankle pain and stiffness s/p R lateral malleolus fx on 03/01/2021. Upon assessment, her primary impairments include severely limited global ankle active and passive ROM R>L, TTP to R lateral ankle, moderate swelling to R ankle, poor balance, weak BIL knee extension, weak BIL global hips, weak BIL global ankles R>L, pain with R ankle resisted movement in all planes, severely limited BIL gastroc, soleus, and quadriceps extensibility, and difficulty walking. In addition to her ankle fx, it is likely that the pt also sustained a combined hip flexor/ quadriceps strain at the time of the fall as indicated by painful R hip flexion and knee extension and pain with Ely's test on the R. The pt will benefit from skilled PT to address her primary impairments and return to her prior level of function with less limitation.    Personal Factors and Comorbidities Age;Fitness;Comorbidity 3+    Comorbidities See medical hx    Examination-Activity Limitations Bathing;Locomotion Level;Transfers;Bed Mobility;Bend;Squat;Stairs;Stand;Lift;Hygiene/Grooming    Examination-Participation Restrictions Community Activity;Occupation;Meal Prep;Shop;Laundry     Stability/Clinical Decision Making Evolving/Moderate complexity    Clinical Decision Making Moderate    Rehab Potential Good    PT Frequency 1x / week    PT Duration 8 weeks    PT Treatment/Interventions ADLs/Self Care Home Management;Aquatic Therapy;Cryotherapy;DME Instruction;Gait training;Stair training;Functional mobility training;Therapeutic activities;Therapeutic exercise;Balance training;Neuromuscular re-education;Manual techniques;Passive range of motion;Compression bandaging;Patient/family education;Manual lymph drainage;Taping;Vasopneumatic Device    PT Next Visit Plan Progress hip, knee strengthening, therapeutic exercise for probable acute hip flexor/ quadriceps strain, intrinsic foot strengthening/ ankle ROM    PT Home Exercise Plan [email or web address]    Consulted and Agree with Plan of Care Patient             Patient will benefit from skilled therapeutic intervention in order to improve the following deficits and impairments:  Abnormal gait, Decreased balance, Decreased mobility, Decreased strength, Increased edema, Impaired flexibility, Hypomobility, Pain, Decreased range of motion, Difficulty walking, Decreased endurance  Visit Diagnosis:  Pain in right ankle and joints of right foot  Stiffness of right ankle, not elsewhere classified  Muscle weakness (generalized)  Unsteadiness on feet     Problem List Patient Active Problem List   Diagnosis Date Noted   Osteopenia 02/15/2021   Venous stasis of both lower extremities 06/27/2020   Bilateral lower extremity edema 02/05/2020   Recurrent sinusitis 09/03/2018   Vaginal vault prolapse after hysterectomy 02/13/2017   Obesity 01/06/2016   Low back pain 01/06/2016   Diabetic peripheral neuropathy ([place]) 09/01/2012   Hyperlipemia 09/16/2006   Essential hypertension 09/16/2006   GERD 09/16/2006   Type II diabetes mellitus with neurological manifestations ([place]) 06/17/2006   Osteoarthritis 06/17/2006   DEPENDENT EDEMA, LEGS,  BILATERAL 06/17/2006    Vanessa Myrtle, PT, DPT 05/19/21 9:20 AM   [facility] [address] [place], [place], [address] Phone: [phone]   Fax:  [phone]  Name: Renee Watts MRN: [id number] Date of Birth: 07/13/1951

## 2021-05-26 ENCOUNTER — Other Ambulatory Visit: Payer: Self-pay

## 2021-05-26 ENCOUNTER — Ambulatory Visit: Payer: HMO

## 2021-05-26 DIAGNOSIS — M25671 Stiffness of right ankle, not elsewhere classified: Secondary | ICD-10-CM

## 2021-05-26 DIAGNOSIS — R2681 Unsteadiness on feet: Secondary | ICD-10-CM

## 2021-05-26 DIAGNOSIS — M25571 Pain in right ankle and joints of right foot: Secondary | ICD-10-CM

## 2021-05-26 DIAGNOSIS — M6281 Muscle weakness (generalized): Secondary | ICD-10-CM

## 2021-05-26 NOTE — Therapy (Addendum)
Renee Watts, [place], [address] Phone: [phone]   Fax:  [phone]  Physical Therapy Treatment  Patient Details  Name: Renee Watts MRN: [id number] Date of Birth: 02/14/1951 Referring Provider (PT): Mcarthur Rossetti, MD   Encounter Date: 05/26/2021   PT End of Session - 05/26/21 0828     Visit Number 2    Number of Visits 9    Date for PT Re-Evaluation 07/14/21    Authorization Type [employer]    Authorization Time Period FOTO v6, v10    PT Start Time 0745    PT Stop Time 0835   10 minutes cryotherapy   PT Time Calculation (min) 50 min    Activity Tolerance Patient tolerated treatment well;Patient limited by pain    Behavior During Therapy [facility] for tasks assessed/performed             Past Medical History:  Diagnosis Date   Allergy    Benign paroxysmal positional vertigo 06/25/2013   dx with prior PCP, s/p labs, mri and vestibular rehab; nasal spray for etd seemed to help   Blood in stool 03/11/2012   Dependent edema    Bilateral   Depression    GERD (gastroesophageal reflux disease)    Hyperlipidemia    Low back pain    sciatica; sees orthopedic specialist ([facility])for this and shoulder tendinitis   Obesity, diabetes, and hypertension syndrome ([place])    Osteoarthritis, knee    Bilateral, s/p 3 total knee replacement surgerues on right, last 2005. Dr. Tonita Cong   Pellegrini-Steida syndrome    Myositis ossificans   Seasonal allergies    Shingles Jul 10 2012   LESIONS CLEARED BUT NOT TOLERATING ANY THING TIGHT AGAINST BODY--SHINGLES WERE AROSS BACK AND ABDOMEN    Past Surgical History:  Procedure Laterality Date   ABDOMINAL HYSTERECTOMY  1984   BLADDER SURGERY  03/2017   bladder tac per pt   COLONOSCOPY     left shoulder steroid injection     POLYPECTOMY     TONSILLECTOMY  1977   TOTAL HIP ARTHROPLASTY Right 10/10/2012   Procedure: R TOTAL HIP ARTHROPLASTY ANTERIOR  APPROACH;  Surgeon: Mcarthur Rossetti, MD;  Location: [facility];  Service: Orthopedics;  Laterality: Right;  Right Total Hip Arthroplasty, Anterior Approach (C-Arm)   TOTAL KNEE ARTHROPLASTY Right    with multiple revisions, 2003, 2005   TOTAL KNEE ARTHROPLASTY Left 01/09/2013   Procedure: LEFT TOTAL KNEE ARTHROPLASTY;  Surgeon: Mcarthur Rossetti, MD;  Location: [facility];  Service: Orthopedics;  Laterality: Left;   TUBAL LIGATION  1978   WISDOM TOOTH EXTRACTION  2013,2014    There were no vitals filed for this visit.   Subjective Assessment - 05/26/21 0739     Subjective Pt reports her R thigh is sore from doing her home exercises. She states she has done her HEP daily. She reports that her R ankle is "okay, just sore."    Currently in Pain? Yes    Pain Score 5     Pain Location Ankle    Pain Orientation Right    Pain Descriptors / Indicators Sore    Pain Type Chronic pain    Pain Onset More than a month ago    Pain Frequency Intermittent                               [facility] Adult PT Treatment/Exercise -  05/26/21 0001       Modalities   Modalities Cryotherapy      Cryotherapy   Number Minutes Cryotherapy 10 Minutes    Cryotherapy Location Ankle   R   Type of Cryotherapy Ice pack      Manual Therapy   Manual Therapy Joint mobilization;Edema management    Edema Management Proximal edema massage to R lower leg    Joint Mobilization AP/PA talocrural grade 3 joint mobilizations; medial/ lateral subtaler grade 3 joint mobilizations to R ankle      Ankle Exercises: Seated   BAPS Sitting;Level 1;10 reps   CW and CCW x10 each     Ankle Exercises: Machines for Strengthening   Cybex Leg Press heel raises with 20# x10 isometric press, unable to perform concentric exercise      Ankle Exercises: Supine   Isometrics Plantarflexion 4x30sec against PT handhold resistance on R                     PT Education - 05/26/21 0828     Education Details  Updated HEP    Person(s) Educated Patient    Methods Explanation;Demonstration;Handout    Comprehension Verbalized understanding;Returned demonstration              PT Short Term Goals - 05/19/21 0907       PT SHORT TERM GOAL #1   Title Pt will report understanding and regular adherence to her HEP in order to promote independence in the management of her primary sxs.    Baseline HEP provided at eval    Time 4    Period Weeks    Status New    Target Date 06/16/21      PT SHORT TERM GOAL #2   Title Pt will demonstrate a 2 cm decrease in R ankle figure 8 measurement in order to promote improved functional ankle AROM.    Baseline 54cm    Time 4    Period Weeks    Status New    Target Date 06/16/21      PT SHORT TERM GOAL #3   Title -    Baseline -      PT SHORT TERM GOAL #4   Title -    Baseline -               PT Long Term Goals - 05/19/21 0909       PT LONG TERM GOAL #1   Title Pt will achieve a FOTO score of 68% in order to demonstrate improved functional ability as it relates to her R ankle sxs.    Baseline 57%    Time 8    Period Weeks    Status New    Target Date 07/14/21      PT LONG TERM GOAL #2   Title Pt will demonstrate improved BIL ankle DF AROM to 0 degrees or higher in order to ambulate with normal gait.    Baseline -18 degrees on R, -12 degrees on L    Time 8    Period Weeks    Status New    Target Date 07/14/21      PT LONG TERM GOAL #3   Title Pt will achieve R ankle plantarflexion strength of 5/5 in order to walk while grocery shopping without limitation.    Baseline 3+/5    Time 8    Period Weeks    Status New    Target Date 07/14/21  PT LONG TERM GOAL #4   Title Pt will report ability to stand >20 minutes with 0-3/10 pain in order to wash dishes without limitation.    Baseline Unable to stand longer than 5-10 minutes without >7/10 pain    Time 8    Period Weeks    Status New    Target Date 07/14/21      PT LONG TERM  GOAL #5   Title Pt will improve BERG balance score to 40 or higher in order to progress to household ambulation without the need of an assistive device.    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    Baseline 55 seconds using chair arms for ascent    Time 8    Period Weeks    Status New    Target Date 07/14/21                   Plan - 05/26/21 0829     Clinical Impression Statement Pt responded well to interventions today, although she demonstrates continued significantly impaired R ankle AROM and plantar flexion strength. She was unable to perform heel raises on the leg press machine with 20 pounds of resistance. She also had difficulty using the BAPS board on level 1. Due to difficulty performing concentric plantar flexion strengthening, this exercises was regressed to isometric plantar flexion, to which the pt reports a good challenge with moderate fatigue. During the session, the pt reports that she has had chronic anterior R hip pain when lifting her leg and assists lifting her leg with her hands when in a supine position. Further assessment of this problem is warranted in future visits. Her pain was reduced to 4/10 following cryotherapy to end the session. She will continue to benefit from skilled PT to address her primary impairments and return to her prior level of function with less limitation.    Personal Factors and Comorbidities Age;Fitness;Comorbidity 3+    Comorbidities See medical hx    Examination-Activity Limitations Bathing;Locomotion Level;Transfers;Bed Mobility;Bend;Squat;Stairs;Stand;Lift;Hygiene/Grooming    Examination-Participation Restrictions Community Activity;Occupation;Meal  Prep;Shop;Laundry    Stability/Clinical Decision Making Evolving/Moderate complexity    Clinical Decision Making Moderate    Rehab Potential Good    PT Frequency 1x / week    PT Duration 8 weeks    PT Treatment/Interventions ADLs/Self Care Home Management;Aquatic Therapy;Cryotherapy;DME Instruction;Gait training;Stair training;Functional mobility training;Therapeutic activities;Therapeutic exercise;Balance training;Neuromuscular re-education;Manual techniques;Passive range of motion;Compression bandaging;Patient/family education;Manual lymph drainage;Taping;Vasopneumatic Device    PT Next Visit Plan Progress hip, knee strengthening, therapeutic exercise for probable acute hip flexor/ quadriceps strain, intrinsic foot strengthening/ ankle ROM, Assess R hip pain with active hip flexion    PT Home Exercise Plan [email or web address]    Consulted and Agree with Plan of Care Patient             Patient will benefit from skilled therapeutic intervention in order to improve the following deficits and impairments:  Abnormal gait, Decreased balance, Decreased mobility, Decreased strength, Increased edema, Impaired flexibility, Hypomobility, Pain, Decreased range of motion, Difficulty walking, Decreased endurance  Visit Diagnosis: Pain in right ankle and  joints of right foot  Stiffness of right ankle, not elsewhere classified  Muscle weakness (generalized)  Unsteadiness on feet     Problem List Patient Active Problem List   Diagnosis Date Noted   Osteopenia 02/15/2021   Venous stasis of both lower extremities 06/27/2020   Bilateral lower extremity edema 02/05/2020   Recurrent sinusitis 09/03/2018   Vaginal vault prolapse after hysterectomy 02/13/2017   Obesity 01/06/2016   Low back pain 01/06/2016   Diabetic peripheral neuropathy ([place]) 09/01/2012   Hyperlipemia 09/16/2006   Essential hypertension 09/16/2006   GERD 09/16/2006   Type II diabetes mellitus with neurological manifestations ([place])  06/17/2006   Osteoarthritis 06/17/2006   DEPENDENT EDEMA, LEGS, BILATERAL 06/17/2006    Vanessa Black Eagle, PT, DPT 05/26/21 8:40 AM   [facility] [address] [place], [place], [address] Phone: [phone]   Fax:  [phone]  Name: CLELA HAGADORN MRN: [id number] Date of Birth: 30-Oct-1950

## 2021-05-26 NOTE — Patient Instructions (Signed)
  [email or web address]

## 2021-06-01 ENCOUNTER — Other Ambulatory Visit: Payer: Self-pay | Admitting: Family Medicine

## 2021-06-01 DIAGNOSIS — D649 Anemia, unspecified: Secondary | ICD-10-CM

## 2021-06-02 ENCOUNTER — Encounter: Payer: Self-pay | Admitting: Physical Therapy

## 2021-06-02 ENCOUNTER — Ambulatory Visit: Payer: HMO | Attending: Orthopaedic Surgery | Admitting: Physical Therapy

## 2021-06-02 ENCOUNTER — Other Ambulatory Visit: Payer: Self-pay

## 2021-06-02 ENCOUNTER — Ambulatory Visit: Payer: HMO

## 2021-06-02 DIAGNOSIS — M25671 Stiffness of right ankle, not elsewhere classified: Secondary | ICD-10-CM

## 2021-06-02 DIAGNOSIS — M6281 Muscle weakness (generalized): Secondary | ICD-10-CM

## 2021-06-02 DIAGNOSIS — R2681 Unsteadiness on feet: Secondary | ICD-10-CM | POA: Diagnosis not present

## 2021-06-02 DIAGNOSIS — M25571 Pain in right ankle and joints of right foot: Secondary | ICD-10-CM | POA: Diagnosis not present

## 2021-06-02 NOTE — Therapy (Signed)
[place], [place], [address] Phone: [phone]   Fax:  [phone]  Physical Therapy Treatment  Patient Details  Name: Renee Watts MRN: [id number] Date of Birth: 1950/11/01 Referring Provider (PT): Mcarthur Rossetti, MD   Encounter Date: 06/02/2021   PT End of Session - 06/02/21 0809     Visit Number 3    Number of Visits 9    Date for PT Re-Evaluation 07/14/21    Authorization Type [employer]    Authorization Time Period FOTO v6, v10    PT Start Time 0802    PT Stop Time 0845    PT Time Calculation (min) 43 min             Past Medical History:  Diagnosis Date   Allergy    Benign paroxysmal positional vertigo 06/25/2013   dx with prior PCP, s/p labs, mri and vestibular rehab; nasal spray for etd seemed to help   Blood in stool 03/11/2012   Dependent edema    Bilateral   Depression    GERD (gastroesophageal reflux disease)    Hyperlipidemia    Low back pain    sciatica; sees orthopedic specialist ([facility])for this and shoulder tendinitis   Obesity, diabetes, and hypertension syndrome ([employer])    Osteoarthritis, knee    Bilateral, s/p 3 total knee replacement surgerues on right, last 2005. Dr. Tonita Cong   Pellegrini-Steida syndrome    Myositis ossificans   Seasonal allergies    Shingles Jul 10 2012   LESIONS CLEARED BUT NOT TOLERATING ANY THING TIGHT AGAINST BODY--SHINGLES WERE AROSS BACK AND ABDOMEN    Past Surgical History:  Procedure Laterality Date   ABDOMINAL HYSTERECTOMY  1984   BLADDER SURGERY  03/2017   bladder tac per pt   COLONOSCOPY     left shoulder steroid injection     POLYPECTOMY     TONSILLECTOMY  1977   TOTAL HIP ARTHROPLASTY Right 10/10/2012   Procedure: R TOTAL HIP ARTHROPLASTY ANTERIOR APPROACH;  Surgeon: Mcarthur Rossetti, MD;  Location: [facility];  Service: Orthopedics;  Laterality: Right;  Right Total Hip Arthroplasty, Anterior Approach (C-Arm)    TOTAL KNEE ARTHROPLASTY Right    with multiple revisions, 2003, 2005   TOTAL KNEE ARTHROPLASTY Left 01/09/2013   Procedure: LEFT TOTAL KNEE ARTHROPLASTY;  Surgeon: Mcarthur Rossetti, MD;  Location: [facility];  Service: Orthopedics;  Laterality: Left;   TUBAL LIGATION  1978   WISDOM TOOTH EXTRACTION  2013,2014    There were no vitals filed for this visit.   Subjective Assessment - 06/02/21 0808     Subjective Pt reports legs are weak and her HEP makes her more weak. She reports compression socks are keeping her ankle swelling down.    Diagnostic tests 05/08/2021: R Ankle XR: IMPRESSION: 2 views of the right ankle show a healed lateral malleolus fracture.     There is some disuse osteopenia.  The ankle mortise is intact.    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 Yes    Pain Score 0-No pain    Pain Location Ankle    Pain Orientation Right    Aggravating Factors  prolonged walking, standing    Pain Relieving Factors ice, elevation, compression, pain meds               [facility] Adult PT Treatment/Exercise - 06/02/21  0001       Knee/Hip Exercises: Seated   Long Arc Quad 10 reps    Long Arc Quad Limitations 5 sec holds    [employer] 10 reps 5 sec    Clamshell with TheraBand Red   x 15 5 sec holds   Sit to [employer] 5 reps   elevated mat     Ankle Exercises: Seated   Towel Inversion/Eversion 5 reps    Heel Raises 15 reps   5 sec holds   Toe Raise 15 reps   5 sec holds   Heel Slides --    Other Seated Ankle Exercises wooden rocker board seated              PT Short Term Goals - 05/19/21 0907       PT SHORT TERM GOAL #1   Title Pt will report understanding and regular adherence to her HEP in order to promote independence in the management of her primary sxs.    Baseline HEP provided at eval    Time 4    Period Weeks    Status New    Target Date 06/16/21       PT SHORT TERM GOAL #2   Title Pt will demonstrate a 2 cm decrease in R ankle figure 8 measurement in order to promote improved functional ankle AROM.    Baseline 54cm    Time 4    Period Weeks    Status New    Target Date 06/16/21      PT SHORT TERM GOAL #3   Title -    Baseline -      PT SHORT TERM GOAL #4   Title -    Baseline -               PT Long Term Goals - 05/19/21 0909       PT LONG TERM GOAL #1   Title Pt will achieve a FOTO score of 68% in order to demonstrate improved functional ability as it relates to her R ankle sxs.    Baseline 57%    Time 8    Period Weeks    Status New    Target Date 07/14/21      PT LONG TERM GOAL #2   Title Pt will demonstrate improved BIL ankle DF AROM to 0 degrees or higher in order to ambulate with normal gait.    Baseline -18 degrees on R, -12 degrees on L    Time 8    Period Weeks    Status New    Target Date 07/14/21      PT LONG TERM GOAL #3   Title Pt will achieve R ankle plantarflexion strength of 5/5 in order to walk while grocery shopping without limitation.    Baseline 3+/5    Time 8    Period Weeks    Status New    Target Date 07/14/21      PT LONG TERM GOAL #4   Title Pt will report ability to stand >20 minutes with 0-3/10 pain in order to wash dishes without limitation.    Baseline Unable to stand longer than 5-10 minutes without >7/10 pain    Time 8    Period Weeks    Status New    Target Date 07/14/21      PT LONG TERM GOAL #5   Title Pt will improve BERG balance score to 40 or higher in order to progress to household ambulation  without the need of an assistive device.    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     Baseline 55 seconds using chair arms for ascent    Time 8    Period Weeks    Status New    Target Date 07/14/21                   Plan - 06/02/21 0762     Clinical Impression Statement Mrs Kluth reports fatigue in legs. Continued with general LE strength as well as right ankle ROM. She reports ice pack and compression socks at home have been helpful. She notes improved ability to perform seated heel raise with more available ROM. Worked on sit -stands. She requires UE assist from elevated mat.  She tolerated session well with reports of feeling like her legs were working.    PT Treatment/Interventions ADLs/Self Care Home Management;Aquatic Therapy;Cryotherapy;DME Instruction;Gait training;Stair training;Functional mobility training;Therapeutic activities;Therapeutic exercise;Balance training;Neuromuscular re-education;Manual techniques;Passive range of motion;Compression bandaging;Patient/family education;Manual lymph drainage;Taping;Vasopneumatic Device    PT Next Visit Plan Progress hip, knee strengthening, therapeutic exercise for probable acute hip flexor/ quadriceps strain, intrinsic foot strengthening/ ankle ROM, Assess R hip pain with active hip flexion    PT Home Exercise Plan [email or web address]    Consulted and Agree with Plan of Care Patient             Patient will benefit from skilled therapeutic intervention in order to improve the following deficits and impairments:  Abnormal gait, Decreased balance, Decreased mobility, Decreased strength, Increased edema, Impaired flexibility, Hypomobility, Pain, Decreased range of motion, Difficulty walking, Decreased endurance  Visit Diagnosis: Pain in right ankle and joints of right foot  Muscle weakness (generalized)  Unsteadiness on feet  Stiffness of right ankle, not elsewhere classified     Problem List Patient Active Problem List   Diagnosis Date Noted   Osteopenia 02/15/2021   Venous stasis of both lower extremities  06/27/2020   Bilateral lower extremity edema 02/05/2020   Recurrent sinusitis 09/03/2018   Vaginal vault prolapse after hysterectomy 02/13/2017   Obesity 01/06/2016   Low back pain 01/06/2016   Diabetic peripheral neuropathy ([place]) 09/01/2012   Hyperlipemia 09/16/2006   Essential hypertension 09/16/2006   GERD 09/16/2006   Type II diabetes mellitus with neurological manifestations ([place]) 06/17/2006   Osteoarthritis 06/17/2006   DEPENDENT EDEMA, LEGS, BILATERAL 06/17/2006    Dorene Ar, PTA 06/02/2021, 10:11 AM  [facility] [address] [place], [place], [address] Phone: [phone]   Fax:  [phone]  Name: MONICIA TSE MRN: [id number] Date of Birth: 16-Aug-1951

## 2021-06-07 ENCOUNTER — Other Ambulatory Visit: Payer: Self-pay

## 2021-06-07 ENCOUNTER — Ambulatory Visit (INDEPENDENT_AMBULATORY_CARE_PROVIDER_SITE_OTHER): Payer: HMO | Admitting: Orthopaedic Surgery

## 2021-06-07 DIAGNOSIS — M7061 Trochanteric bursitis, right hip: Secondary | ICD-10-CM

## 2021-06-07 DIAGNOSIS — S8264XD Nondisplaced fracture of lateral malleolus of right fibula, subsequent encounter for closed fracture with routine healing: Secondary | ICD-10-CM

## 2021-06-07 MED ORDER — LIDOCAINE HCL 1 % IJ SOLN
3.0000 mL | INTRAMUSCULAR | Status: AC | PRN
  Administered 2021-06-07: 3 mL

## 2021-06-07 MED ORDER — METHYLPREDNISOLONE ACETATE 40 MG/ML IJ SUSP
40.0000 mg | INTRAMUSCULAR | Status: AC | PRN
  Administered 2021-06-07: 40 mg via INTRA_ARTICULAR

## 2021-06-07 MED ORDER — TRAMADOL HCL 50 MG PO TABS: 50.0000 mg | ORAL_TABLET | Freq: Three times a day (TID) | ORAL | 0 refills | Status: DC | PRN

## 2021-06-07 NOTE — Progress Notes (Signed)
Office Visit Note   Patient: Renee Watts           Date of Birth: 03-Aug-1951           MRN: [id number] Visit Date: 06/07/2021              Requested by: Billie Ruddy, MD [place],  [place] [id number] PCP: Billie Ruddy, MD   Assessment & Plan: Visit Diagnoses:  1. Trochanteric bursitis, right hip   2. Closed nondisplaced fracture of lateral malleolus of right fibula with routine healing, subsequent encounter     Plan: She is activities as tolerated in regards to the ankle.  She shown IT band stretching exercises.  She will continue work with physical therapy gait balance strengthening of her lower extremities.  Asked her to avoid abduction exercises for the time being.  We will see her back in 2 weeks see what type of response she had to the injection today for right trochanteric bursitis.  Follow-Up Instructions: Return in about 2 weeks (around 06/21/2021).   Orders:  Orders Placed This Encounter  Procedures   Large Joint Inj: R greater trochanter   Meds ordered this encounter  Medications   traMADol (ULTRAM) 50 MG tablet    Sig: Take 1 tablet (50 mg total) by mouth every 8 (eight) hours as needed.    Dispense:  40 tablet    Refill:  0      Procedures: Large Joint Inj: R greater trochanter on 06/07/2021 9:01 AM Indications: pain Details: 22 G 1.5 in needle, lateral approach  Arthrogram: No  Medications: 3 mL lidocaine 1 %; 40 mg methylPREDNISolone acetate 40 MG/ML Outcome: tolerated well, no immediate complications Procedure, treatment alternatives, risks and benefits explained, specific risks discussed. Consent was given by the patient. Immediately prior to procedure a time out was called to verify the correct patient, procedure, equipment, support staff and site/side marked as required. Patient was prepped and draped in the usual sterile fashion.      Clinical Data: No additional findings.   Subjective: Chief Complaint   Patient presents with   Right Ankle - Follow-up    HPI Renee Watts returns today for follow-up of her right ankle again she had a left nondisplaced lateral malleolus fracture.  She been treated conservatively.  She states the ankle overall is doing well.  She feels the ankle is getting better she has been going to physical therapy.  Does have some fatigue though in both legs and states she has had some pain in her right thigh that is been ongoing for greater than a year she has told no one about it.  States pain is 10 out of 10 pain at worst.  She describes no back pain.  No groin pain.  No numbness tingling down either leg.  She states pain is worse whenever she flexes the hip.  Points to the anterior lateral aspect of the hip.  Pain occurs daily.  No known injury. In regards to the ankle which she is gone to 3 physical therapy appointments movements.  She is having no mechanical symptoms the ankle.  She is no longer wearing an ASO brace. She is diabetic reports last hemoglobin A1c was 6.1 a month ago. Review of Systems Negative for fevers chills.  Objective: Vital Signs: There were no vitals taken for this visit.  Physical Exam General well-developed well-nourished female no acute distress. Ortho Exam Right ankle she has minimal tenderness over the lateral  malleolus.  She has slightly limited dorsiflexion of the left ankle but full plantarflexion.  5 out of 5 strength with inversion eversion against resistance right foot.  No rashes skin lesions impending ulcers or ecchymosis involving the right ankle and right foot.  Bilateral hips: Good range of motion of both hips external rotation of the right hip causes pain.  She has tenderness over the right trochanteric region no tenderness over the left trochanteric region.  Actively able to flex both hips but has pain with flexion of the right hip.  Specialty Comments:  No specialty comments available.  Imaging: No results found.   PMFS  History: Patient Active Problem List   Diagnosis Date Noted   Osteopenia 02/15/2021   Venous stasis of both lower extremities 06/27/2020   Bilateral lower extremity edema 02/05/2020   Recurrent sinusitis 09/03/2018   Vaginal vault prolapse after hysterectomy 02/13/2017   Obesity 01/06/2016   Low back pain 01/06/2016   Diabetic peripheral neuropathy ([place]) 09/01/2012   Hyperlipemia 09/16/2006   Essential hypertension 09/16/2006   GERD 09/16/2006   Type II diabetes mellitus with neurological manifestations ([place]) 06/17/2006   Osteoarthritis 06/17/2006   DEPENDENT EDEMA, LEGS, BILATERAL 06/17/2006   Past Medical History:  Diagnosis Date   Allergy    Benign paroxysmal positional vertigo 06/25/2013   dx with prior PCP, s/p labs, mri and vestibular rehab; nasal spray for etd seemed to help   Blood in stool 03/11/2012   Dependent edema    Bilateral   Depression    GERD (gastroesophageal reflux disease)    Hyperlipidemia    Low back pain    sciatica; sees orthopedic specialist ([facility])for this and shoulder tendinitis   Obesity, diabetes, and hypertension syndrome ([place])    Osteoarthritis, knee    Bilateral, s/p 3 total knee replacement surgerues on right, last 2005. Dr. Tonita Cong   Pellegrini-Steida syndrome    Myositis ossificans   Seasonal allergies    Shingles Jul 10 2012   LESIONS CLEARED BUT NOT TOLERATING ANY THING TIGHT AGAINST BODY--SHINGLES WERE AROSS BACK AND ABDOMEN    Family History  Problem Relation Age of Onset   Diabetes Mother    Hypertension Mother    Hypertension Father    Cancer Father        Lung CA   Coronary artery disease Father    Colon cancer Neg Hx    Colon polyps Neg Hx    Esophageal cancer Neg Hx    Rectal cancer Neg Hx    Stomach cancer Neg Hx     Past Surgical History:  Procedure Laterality Date   ABDOMINAL HYSTERECTOMY  1984   BLADDER SURGERY  03/2017   bladder tac per pt   COLONOSCOPY     left shoulder steroid injection      POLYPECTOMY     TONSILLECTOMY  1977   TOTAL HIP ARTHROPLASTY Right 10/10/2012   Procedure: R TOTAL HIP ARTHROPLASTY ANTERIOR APPROACH;  Surgeon: Mcarthur Rossetti, MD;  Location: [facility];  Service: Orthopedics;  Laterality: Right;  Right Total Hip Arthroplasty, Anterior Approach (C-Arm)   TOTAL KNEE ARTHROPLASTY Right    with multiple revisions, 2003, 2005   TOTAL KNEE ARTHROPLASTY Left 01/09/2013   Procedure: LEFT TOTAL KNEE ARTHROPLASTY;  Surgeon: Mcarthur Rossetti, MD;  Location: [facility];  Service: Orthopedics;  Laterality: Left;   TUBAL LIGATION  1978   WISDOM TOOTH EXTRACTION  2013,2014   Social History   Occupational History    Employer: [employer]  Tobacco Use   Smoking status: Never   Smokeless tobacco: Never  Substance and Sexual Activity   Alcohol use: No   Drug use: No   Sexual activity: Not on file

## 2021-06-09 ENCOUNTER — Ambulatory Visit: Payer: HMO

## 2021-06-09 ENCOUNTER — Other Ambulatory Visit: Payer: Self-pay

## 2021-06-09 DIAGNOSIS — M25671 Stiffness of right ankle, not elsewhere classified: Secondary | ICD-10-CM

## 2021-06-09 DIAGNOSIS — M25571 Pain in right ankle and joints of right foot: Secondary | ICD-10-CM

## 2021-06-09 DIAGNOSIS — M6281 Muscle weakness (generalized): Secondary | ICD-10-CM

## 2021-06-09 DIAGNOSIS — R2681 Unsteadiness on feet: Secondary | ICD-10-CM

## 2021-06-09 NOTE — Therapy (Signed)
[place], [place], [address] Phone: [phone]   Fax:  [phone]  Physical Therapy Treatment  Patient Details  Name: Renee Watts MRN: [id number] Date of Birth: 02-17-1951 Referring Provider (PT): Mcarthur Rossetti, MD   Encounter Date: 06/09/2021   PT End of Session - 06/09/21 0826     Visit Number 4    Number of Visits 9    Date for PT Re-Evaluation 07/14/21    Authorization Type [employer]    Authorization Time Period FOTO v6, v10    PT Start Time 0745    PT Stop Time 0830    PT Time Calculation (min) 45 min    Equipment Utilized During Treatment Gait belt    Activity Tolerance Patient tolerated treatment well;Patient limited by pain    Behavior During Therapy [facility] for tasks assessed/performed             Past Medical History:  Diagnosis Date   Allergy    Benign paroxysmal positional vertigo 06/25/2013   dx with prior PCP, s/p labs, mri and vestibular rehab; nasal spray for etd seemed to help   Blood in stool 03/11/2012   Dependent edema    Bilateral   Depression    GERD (gastroesophageal reflux disease)    Hyperlipidemia    Low back pain    sciatica; sees orthopedic specialist ([facility])for this and shoulder tendinitis   Obesity, diabetes, and hypertension syndrome ([place])    Osteoarthritis, knee    Bilateral, s/p 3 total knee replacement surgerues on right, last 2005. Dr. Tonita Cong   Pellegrini-Steida syndrome    Myositis ossificans   Seasonal allergies    Shingles Jul 10 2012   LESIONS CLEARED BUT NOT TOLERATING ANY THING TIGHT AGAINST BODY--SHINGLES WERE AROSS BACK AND ABDOMEN    Past Surgical History:  Procedure Laterality Date   ABDOMINAL HYSTERECTOMY  1984   BLADDER SURGERY  03/2017   bladder tac per pt   COLONOSCOPY     left shoulder steroid injection     POLYPECTOMY     TONSILLECTOMY  1977   TOTAL HIP ARTHROPLASTY Right 10/10/2012   Procedure: R TOTAL  HIP ARTHROPLASTY ANTERIOR APPROACH;  Surgeon: Mcarthur Rossetti, MD;  Location: [facility];  Service: Orthopedics;  Laterality: Right;  Right Total Hip Arthroplasty, Anterior Approach (C-Arm)   TOTAL KNEE ARTHROPLASTY Right    with multiple revisions, 2003, 2005   TOTAL KNEE ARTHROPLASTY Left 01/09/2013   Procedure: LEFT TOTAL KNEE ARTHROPLASTY;  Surgeon: Mcarthur Rossetti, MD;  Location: [facility];  Service: Orthopedics;  Laterality: Left;   TUBAL LIGATION  1978   WISDOM TOOTH EXTRACTION  2013,2014    There were no vitals filed for this visit.   Subjective Assessment - 06/09/21 0741     Subjective Pt reports that her ankle has been doing well, but that her R upper thigh has been hurting more recently, which led her to recieve a steroid injection in her hip last week. Her thigh pain has improved since the injection, although she still has R groin pain. She reports walking about 4 blocks yesterday. She also reports doing her HEP about every other day, not performing it when her thigh pain was too severe. She states that her hip pain has been worse since her fall in July and that she continues to have difficulty with transfers and exercises because of her hip pain.    Currently in Pain? Yes    Pain Score  5     Pain Location Ankle    Pain Orientation Right    Pain Descriptors / Indicators Sore    Pain Type Chronic pain    Pain Onset More than a month ago    Pain Frequency Intermittent                [facility] PT Assessment - 06/09/21 0001       Special Tests   Other special tests Anterior labrar tear test (+) on R, FADIR (+) on R      Patrick (FABER) Test   Findings Positive    Side Right      Thomas Test    Findings Negative    Side Right;Left      Hip Scouring   Findings Positive    Side Right      other   Findings Positive    Side Right    Comments Active SLR      other   Findings Positive    Side Right    Comments Long-Axis hip distraction                            [facility] Adult PT Treatment/Exercise - 06/09/21 0001       Ankle Exercises: Standing   SLS On Airex pad in // bars 2x1 minute BIL    Other Standing Ankle Exercises Tandem stance on Airex pad in // bars 2x1 minute BIL    Other Standing Ankle Exercises Step up with forward ankle roll on Airex pad 2x10 BIL      Ankle Exercises: Seated   BAPS Sitting;Level 3;Other (comment)   x10 CW and CCW on R                    PT Education - 06/09/21 0826     Education Details Pt educated about objective findings related to her R hip, as well as the PT's recommendation for hip imaging to rule out intra-articular pathology which is limiting therapy sessions.    Person(s) Educated Patient    Methods Explanation    Comprehension Verbalized understanding              PT Short Term Goals - 05/19/21 0907       PT SHORT TERM GOAL #1   Title Pt will report understanding and regular adherence to her HEP in order to promote independence in the management of her primary sxs.    Baseline HEP provided at eval    Time 4    Period Weeks    Status New    Target Date 06/16/21      PT SHORT TERM GOAL #2   Title Pt will demonstrate a 2 cm decrease in R ankle figure 8 measurement in order to promote improved functional ankle AROM.    Baseline 54cm    Time 4    Period Weeks    Status New    Target Date 06/16/21      PT SHORT TERM GOAL #3   Title -    Baseline -      PT SHORT TERM GOAL #4   Title -    Baseline -               PT Long Term Goals - 05/19/21 0909       PT LONG TERM GOAL #1   Title Pt will achieve a FOTO score of 68% in order  to demonstrate improved functional ability as it relates to her R ankle sxs.    Baseline 57%    Time 8    Period Weeks    Status New    Target Date 07/14/21      PT LONG TERM GOAL #2   Title Pt will demonstrate improved BIL ankle DF AROM to 0 degrees or higher in order to ambulate with normal gait.     Baseline -18 degrees on R, -12 degrees on L    Time 8    Period Weeks    Status New    Target Date 07/14/21      PT LONG TERM GOAL #3   Title Pt will achieve R ankle plantarflexion strength of 5/5 in order to walk while grocery shopping without limitation.    Baseline 3+/5    Time 8    Period Weeks    Status New    Target Date 07/14/21      PT LONG TERM GOAL #4   Title Pt will report ability to stand >20 minutes with 0-3/10 pain in order to wash dishes without limitation.    Baseline Unable to stand longer than 5-10 minutes without >7/10 pain    Time 8    Period Weeks    Status New    Target Date 07/14/21      PT LONG TERM GOAL #5   Title Pt will improve BERG balance score to 40 or higher in order to progress to household ambulation without the need of an assistive device.    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    Baseline 55 seconds using chair arms for ascent    Time 8    Period Weeks    Status New    Target Date 07/14/21                   Plan - 06/09/21 0827     Clinical Impression Statement Pt's primary c/o pain and functional disability is in regard to her R hip. Further assessment of the hip today demonstrates probable intra-articular pathology which is limiting the pt's functional ability to participate in her ankle rehab. She has significant pain with all active hip flexion, including during transfers and with exercises involving stepping. Hip special testing also reveals likehood of intra-articular pathology. Positive special tests include FABER, FADIR, hip scouring, anterior labral tear test, and long-axis distraction. Recommend R hip X-ray to rule out bony intra-articular  pathology resulting from her fall in July. The pt will continue to benefit from skilled PT to address her primary impairments and return to her prior level of function with less limitation due to pain.    Personal Factors and Comorbidities Age;Fitness;Comorbidity 3+    Comorbidities See medical hx    Examination-Activity Limitations Bathing;Locomotion Level;Transfers;Bed Mobility;Bend;Squat;Stairs;Stand;Lift;Hygiene/Grooming    Examination-Participation Restrictions Community Activity;Occupation;Meal Prep;Shop;Laundry    Stability/Clinical Decision Making Evolving/Moderate complexity    Clinical Decision Making Moderate    Rehab Potential Good    PT Frequency 1x / week    PT Duration 8 weeks  PT Treatment/Interventions ADLs/Self Care Home Management;Aquatic Therapy;Cryotherapy;DME Instruction;Gait training;Stair training;Functional mobility training;Therapeutic activities;Therapeutic exercise;Balance training;Neuromuscular re-education;Manual techniques;Passive range of motion;Compression bandaging;Patient/family education;Manual lymph drainage;Taping;Vasopneumatic Device    PT Next Visit Plan Progress hip, knee strengthening, intrinsic foot strengthening/ ankle ROM, f/u regarding hip imaging    PT Home Exercise Plan [email or web address]    Recommended Other Services X-ray of R hip    Consulted and Agree with Plan of Care Patient             Patient will benefit from skilled therapeutic intervention in order to improve the following deficits and impairments:  Abnormal gait, Decreased balance, Decreased mobility, Decreased strength, Increased edema, Impaired flexibility, Hypomobility, Pain, Decreased range of motion, Difficulty walking, Decreased endurance  Visit Diagnosis: Pain in right ankle and joints of right foot  Muscle weakness (generalized)  Unsteadiness on feet  Stiffness of right ankle, not elsewhere classified     Problem List Patient Active Problem List   Diagnosis Date Noted    Osteopenia 02/15/2021   Venous stasis of both lower extremities 06/27/2020   Bilateral lower extremity edema 02/05/2020   Recurrent sinusitis 09/03/2018   Vaginal vault prolapse after hysterectomy 02/13/2017   Obesity 01/06/2016   Low back pain 01/06/2016   Diabetic peripheral neuropathy ([place]) 09/01/2012   Hyperlipemia 09/16/2006   Essential hypertension 09/16/2006   GERD 09/16/2006   Type II diabetes mellitus with neurological manifestations ([place]) 06/17/2006   Osteoarthritis 06/17/2006   DEPENDENT EDEMA, LEGS, BILATERAL 06/17/2006    Vanessa Midway, PT, DPT 06/09/21 11:05 AM   [facility] [address] [place], [place], [address] Phone: [phone]   Fax:  [phone]  Name: Renee Watts MRN: [id number] Date of Birth: 11/09/1950

## 2021-06-16 ENCOUNTER — Other Ambulatory Visit: Payer: Self-pay

## 2021-06-16 ENCOUNTER — Ambulatory Visit: Payer: HMO

## 2021-06-16 DIAGNOSIS — M25571 Pain in right ankle and joints of right foot: Secondary | ICD-10-CM | POA: Diagnosis not present

## 2021-06-16 DIAGNOSIS — M25671 Stiffness of right ankle, not elsewhere classified: Secondary | ICD-10-CM

## 2021-06-16 DIAGNOSIS — R2681 Unsteadiness on feet: Secondary | ICD-10-CM

## 2021-06-16 DIAGNOSIS — M6281 Muscle weakness (generalized): Secondary | ICD-10-CM

## 2021-06-16 NOTE — Therapy (Addendum)
[place], [place], [address] Phone: [phone]   Fax:  [phone]  Physical Therapy Treatment / Discharge  Patient Details  Name: Renee Watts MRN: [id number] Date of Birth: Jun 17, 1951 Referring Provider (PT): Mcarthur Rossetti, MD   Encounter Date: 06/16/2021   PT End of Session - 06/16/21 0749     Visit Number 5    Number of Visits 9    Date for PT Re-Evaluation 07/14/21    Authorization Type [employer]    Authorization Time Period FOTO v6, v10    PT Start Time 0740    PT Stop Time 0825    PT Time Calculation (min) 45 min    Equipment Utilized During Treatment Gait belt    Activity Tolerance Patient tolerated treatment well;Patient limited by pain    Behavior During Therapy [facility] for tasks assessed/performed             Past Medical History:  Diagnosis Date   Allergy    Benign paroxysmal positional vertigo 06/25/2013   dx with prior PCP, s/p labs, mri and vestibular rehab; nasal spray for etd seemed to help   Blood in stool 03/11/2012   Dependent edema    Bilateral   Depression    GERD (gastroesophageal reflux disease)    Hyperlipidemia    Low back pain    sciatica; sees orthopedic specialist ([facility])for this and shoulder tendinitis   Obesity, diabetes, and hypertension syndrome ([place])    Osteoarthritis, knee    Bilateral, s/p 3 total knee replacement surgerues on right, last 2005. Dr. Tonita Cong   Pellegrini-Steida syndrome    Myositis ossificans   Seasonal allergies    Shingles Jul 10 2012   LESIONS CLEARED BUT NOT TOLERATING ANY THING TIGHT AGAINST BODY--SHINGLES WERE AROSS BACK AND ABDOMEN    Past Surgical History:  Procedure Laterality Date   ABDOMINAL HYSTERECTOMY  1984   BLADDER SURGERY  03/2017   bladder tac per pt   COLONOSCOPY     left shoulder steroid injection     POLYPECTOMY     TONSILLECTOMY  1977   TOTAL HIP ARTHROPLASTY Right 10/10/2012    Procedure: R TOTAL HIP ARTHROPLASTY ANTERIOR APPROACH;  Surgeon: Mcarthur Rossetti, MD;  Location: [facility];  Service: Orthopedics;  Laterality: Right;  Right Total Hip Arthroplasty, Anterior Approach (C-Arm)   TOTAL KNEE ARTHROPLASTY Right    with multiple revisions, 2003, 2005   TOTAL KNEE ARTHROPLASTY Left 01/09/2013   Procedure: LEFT TOTAL KNEE ARTHROPLASTY;  Surgeon: Mcarthur Rossetti, MD;  Location: [facility];  Service: Orthopedics;  Laterality: Left;   TUBAL LIGATION  1978   WISDOM TOOTH EXTRACTION  2013,2014    There were no vitals filed for this visit.   Subjective Assessment - 06/16/21 0739     Subjective Pt reports that her ankle has been doing much better, adding that she is currently only experiencing soreness today. She also reports that her hip feels slightly better, although she still has pain today. Pt reports that she has been walking and performing her home exercises. She states her next orthopedic appointment is scheduled for next Wednesday.    Currently in Pain? Yes    Pain Score 2     Pain Location Hip    Pain Orientation Right    Pain Descriptors / Indicators Aching    Pain Type Chronic pain    Pain Onset More than a month ago    Pain Frequency Intermittent  [place] Adult PT Treatment/Exercise - 06/16/21 0001       Ankle Exercises: Aerobic   Nustep x5 minutes at self-selected pace, level 3 resistance      Ankle Exercises: Standing   Other Standing Ankle Exercises Alternating standing marches with slow leg lowering on Airex pad 3x10 BIL      Ankle Exercises: Supine   Other Supine Ankle Exercises Bridge 2x10    Other Supine Ankle Exercises Isometric R hip flexion against PT handhold resistance 3x5 with 10-sec hold      Ankle Exercises: Seated   Towel Inversion/Eversion 5 reps   eversion and inversion                      PT Short Term Goals - 05/19/21 0907       PT SHORT TERM GOAL #1    Title Pt will report understanding and regular adherence to her HEP in order to promote independence in the management of her primary sxs.    Baseline HEP provided at eval    Time 4    Period Weeks    Status New    Target Date 06/16/21      PT SHORT TERM GOAL #2   Title Pt will demonstrate a 2 cm decrease in R ankle figure 8 measurement in order to promote improved functional ankle AROM.    Baseline 54cm    Time 4    Period Weeks    Status New    Target Date 06/16/21      PT SHORT TERM GOAL #3   Title -    Baseline -      PT SHORT TERM GOAL #4   Title -    Baseline -               PT Long Term Goals - 05/19/21 0909       PT LONG TERM GOAL #1   Title Pt will achieve a FOTO score of 68% in order to demonstrate improved functional ability as it relates to her R ankle sxs.    Baseline 57%    Time 8    Period Weeks    Status New    Target Date 07/14/21      PT LONG TERM GOAL #2   Title Pt will demonstrate improved BIL ankle DF AROM to 0 degrees or higher in order to ambulate with normal gait.    Baseline -18 degrees on R, -12 degrees on L    Time 8    Period Weeks    Status New    Target Date 07/14/21      PT LONG TERM GOAL #3   Title Pt will achieve R ankle plantarflexion strength of 5/5 in order to walk while grocery shopping without limitation.    Baseline 3+/5    Time 8    Period Weeks    Status New    Target Date 07/14/21      PT LONG TERM GOAL #4   Title Pt will report ability to stand >20 minutes with 0-3/10 pain in order to wash dishes without limitation.    Baseline Unable to stand longer than 5-10 minutes without >7/10 pain    Time 8    Period Weeks    Status New    Target Date 07/14/21      PT LONG TERM GOAL #5   Title Pt will improve BERG balance score to 40 or higher in order to progress to  household ambulation without the need of an assistive device.    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    Baseline 55 seconds using chair arms for ascent    Time 8    Period Weeks    Status New    Target Date 07/14/21                   Plan - 06/16/21 0826     Clinical Impression Statement Pt continued to be limited by hip pain during the session, unable to perform exercises that require concentric hip flexion without considerable pain. She tolerated isometric hip flexion fairly well, although she still has moderate pain with this exercise, as well. She has an orthopedic appointment next Wednesday, where it is planned to perform a hip X-ray to rule out any bony abnormality at the hip. She will continue to benefit from skilled PT to address her primary impairments and return to her prior level of function without limitation.    Personal Factors and Comorbidities Age;Fitness;Comorbidity 3+    Comorbidities See medical hx    Examination-Activity Limitations Bathing;Locomotion Level;Transfers;Bed Mobility;Bend;Squat;Stairs;Stand;Lift;Hygiene/Grooming    Examination-Participation Restrictions Community Activity;Occupation;Meal Prep;Shop;Laundry    Stability/Clinical Decision Making Evolving/Moderate complexity    Clinical Decision Making Moderate    Rehab Potential Good    PT Frequency 1x / week    PT Duration 8 weeks    PT Treatment/Interventions ADLs/Self Care Home Management;Aquatic Therapy;Cryotherapy;DME Instruction;Gait training;Stair training;Functional mobility training;Therapeutic activities;Therapeutic exercise;Balance training;Neuromuscular re-education;Manual techniques;Passive range of motion;Compression bandaging;Patient/family education;Manual lymph drainage;Taping;Vasopneumatic Device    PT Next Visit Plan Progress hip, knee strengthening,  intrinsic foot strengthening/ ankle ROM    PT Home Exercise Plan [id number]    Consulted and Agree with Plan of Care Patient             Patient will benefit from skilled therapeutic intervention in order to improve the following deficits and impairments:  Abnormal gait, Decreased balance, Decreased mobility, Decreased strength, Increased edema, Impaired flexibility, Hypomobility, Pain, Decreased range of motion, Difficulty walking, Decreased endurance  Visit Diagnosis: Pain in right ankle and joints of right foot  Muscle weakness (generalized)  Unsteadiness on feet  Stiffness of right ankle, not elsewhere classified     Problem List Patient Active Problem List   Diagnosis Date Noted   Osteopenia 02/15/2021   Venous stasis of both lower extremities 06/27/2020   Bilateral lower extremity edema 02/05/2020   Recurrent sinusitis 09/03/2018   Vaginal vault prolapse after hysterectomy 02/13/2017   Obesity 01/06/2016   Low back pain 01/06/2016   Diabetic peripheral neuropathy ([place]) 09/01/2012   Hyperlipemia 09/16/2006   Essential hypertension 09/16/2006   GERD 09/16/2006   Type II diabetes mellitus with neurological manifestations ([place]) 06/17/2006   Osteoarthritis 06/17/2006   DEPENDENT EDEMA, LEGS, BILATERAL 06/17/2006    Vanessa P[place], PT, DPT 06/16/21 8:31 AM   [facility] [address] [place], [place], [address] Phone: [phone]   Fax:  [phone]  Name: Renee Watts MRN: [id number] Date of Birth: 1951-01-19   PHYSICAL THERAPY DISCHARGE SUMMARY  Visits from Start of Care: 5  Current functional level related to goals / functional outcomes: See above   Remaining deficits: See above   Education / Equipment: HEP   Patient agrees to discharge. Patient goals were not met. Patient is being discharged due to not returning since the last visit.  Hilda Blades, PT, DPT, LAT, ATC 07/06/21  3:12  PM Phone: [phone] Fax: [phone]

## 2021-06-21 ENCOUNTER — Encounter: Payer: Self-pay | Admitting: Orthopaedic Surgery

## 2021-06-21 ENCOUNTER — Other Ambulatory Visit: Payer: Self-pay

## 2021-06-21 ENCOUNTER — Ambulatory Visit: Payer: HMO | Admitting: Orthopaedic Surgery

## 2021-06-21 DIAGNOSIS — S8264XD Nondisplaced fracture of lateral malleolus of right fibula, subsequent encounter for closed fracture with routine healing: Secondary | ICD-10-CM | POA: Diagnosis not present

## 2021-06-21 DIAGNOSIS — M7061 Trochanteric bursitis, right hip: Secondary | ICD-10-CM

## 2021-06-21 NOTE — Progress Notes (Signed)
The patient is now over 3 months status post a nondisplaced ankle fracture of the right ankle lateral malleolus.  She also has a history of a right hip replacement.  She is ambulating with a cane.  She is 70 years old.  She is going through outpatient physical therapy and states that she is doing well overall but she still having some pain in her right groin area.  At her last visit she had a steroid injection over the trochanteric area of the right hip and that is helped her quite a bit.  She still ambulates using the cane on the right side I encouraged her to try to use on the left side to offload that side.  Her right hip moves smoothly and fluidly and her right ankle motion is improved significantly.  She did let me know she has at least 2-3 more physical therapy visits.  From my standpoint I think this is just her body knee doing just as she goes through the healing process of dealing with an ankle fracture that is healing on the same side of a total hip arthroplasty.  There is nothing else I recommend other than her continuing therapy and hopefully with time this will all improve.  We can certainly see her back in 3 months for repeat exam but no x-rays are needed unless there are issues.

## 2021-06-29 ENCOUNTER — Encounter: Payer: Self-pay | Admitting: Pharmacist

## 2021-06-29 DIAGNOSIS — T466X5A Adverse effect of antihyperlipidemic and antiarteriosclerotic drugs, initial encounter: Secondary | ICD-10-CM

## 2021-06-29 DIAGNOSIS — G720 Drug-induced myopathy: Secondary | ICD-10-CM

## 2021-06-29 NOTE — Progress Notes (Signed)
[facility])                                            [place] Team                                        Statin Quality Measure Assessment    06/29/2021  Renee Watts [birth year] [id number]   Per review of chart and payor information, patient has a diagnosis of diabetes but is not currently filling a statin prescription.  This places patient into the SUPD (Statin Use In Patients with Diabetes) measure for CMS.    Pravastatin was held earlier in the year due to the patient's complaints of muscle aches.  She has an upcoming PCP appointment on 07/03/21.  To pass or be removed from the measure, statin therapy could be restarted at a lower dose or with alternative dosing OR a statin exclusion code can be associated with the upcoming PCP visit.  The 10-year ASCVD risk score (Arnett DK, et al., 2019) is: 26.9%   Values used to calculate the score:     Age: 70 years     Sex: Female     Is Non-Hispanic African American: Yes     Diabetic: Yes     Tobacco smoker: No     Systolic Blood Pressure: 948 mmHg     Is BP treated: Yes     HDL Cholesterol: 39 mg/dL     Total Cholesterol: 205 mg/dL 06/27/2020     Component Value Date/Time   CHOL 205 (H) 06/27/2020 1017   TRIG 132 06/27/2020 1017   HDL 39 (L) 06/27/2020 1017   CHOLHDL 5.3 (H) 06/27/2020 1017   VLDL 28.4 04/12/2017 1231   LDLCALC 140 (H) 06/27/2020 1017   LDLDIRECT 137.8 10/12/2013 1016    Please consider ONE of the following recommendations:  Initiate high intensity statin Atorvastatin 40mg  once daily, #90, 3 refills   Rosuvastatin 20mg  once daily, #90, 3 refills    Initiate moderate intensity          statin with reduced frequency if prior          statin intolerance 1x weekly, #13, 3 refills   2x weekly, #26, 3 refills   3x weekly, #39, 3 refills    Code for past statin intolerance or  other exclusions (required annually)   Provider Requirements:    Associate code during an office visit or telehealth encounter  Drug Induced Myopathy G72.0   Myopathy, unspecified G72.9   Myositis, unspecified M60.9   Rhabdomyolysis N46.27   Alcoholic fatty liver O35.0   Cirrhosis of liver K74.69   Prediabetes R73.03   PCOS E28.2   Toxic liver disease, unspecified K71.9   Adverse effect of antihyperlipidemic and antiarteriosclerotic drugs, initial encounter K93.8H8E    Plan: Route note to provider prior to the 07/03/21 appointment.  Elayne Guerin, PharmD, [place] Clinical Pharmacist [phone]

## 2021-07-03 ENCOUNTER — Encounter: Payer: Self-pay | Admitting: Family Medicine

## 2021-07-03 ENCOUNTER — Ambulatory Visit (INDEPENDENT_AMBULATORY_CARE_PROVIDER_SITE_OTHER): Payer: HMO | Admitting: Family Medicine

## 2021-07-03 ENCOUNTER — Other Ambulatory Visit: Payer: Self-pay | Admitting: Family Medicine

## 2021-07-03 ENCOUNTER — Other Ambulatory Visit: Payer: Self-pay

## 2021-07-03 VITALS — BP 148/79 | HR 55 | Temp 98.3°F | Wt 167.8 lb

## 2021-07-03 DIAGNOSIS — Z96653 Presence of artificial knee joint, bilateral: Secondary | ICD-10-CM

## 2021-07-03 DIAGNOSIS — J302 Other seasonal allergic rhinitis: Secondary | ICD-10-CM | POA: Diagnosis not present

## 2021-07-03 DIAGNOSIS — R202 Paresthesia of skin: Secondary | ICD-10-CM | POA: Diagnosis not present

## 2021-07-03 DIAGNOSIS — I10 Essential (primary) hypertension: Secondary | ICD-10-CM

## 2021-07-03 DIAGNOSIS — G8929 Other chronic pain: Secondary | ICD-10-CM | POA: Diagnosis not present

## 2021-07-03 DIAGNOSIS — E1149 Type 2 diabetes mellitus with other diabetic neurological complication: Secondary | ICD-10-CM | POA: Diagnosis not present

## 2021-07-03 DIAGNOSIS — M25471 Effusion, right ankle: Secondary | ICD-10-CM

## 2021-07-03 DIAGNOSIS — M25562 Pain in left knee: Secondary | ICD-10-CM

## 2021-07-03 DIAGNOSIS — M79651 Pain in right thigh: Secondary | ICD-10-CM | POA: Diagnosis not present

## 2021-07-03 DIAGNOSIS — I878 Other specified disorders of veins: Secondary | ICD-10-CM

## 2021-07-03 LAB — TSH: TSH: 1.1 u[IU]/mL (ref 0.35–5.50)

## 2021-07-03 LAB — LIPID PANEL
Cholesterol: 165 mg/dL (ref 0–200)
HDL: 39.4 mg/dL (ref >39.00)
LDL Cholesterol: 101 mg/dL — ABNORMAL HIGH (ref 0–99)
NonHDL: 125.97
Total CHOL/HDL Ratio: 4
Triglycerides: 126 mg/dL (ref 0.0–149.0)
VLDL: 25.2 mg/dL (ref 0.0–40.0)

## 2021-07-03 LAB — BASIC METABOLIC PANEL
BUN: 8 mg/dL (ref 6–23)
CO2: 30 mEq/L (ref 19–32)
Calcium: 9.2 mg/dL (ref 8.4–10.5)
Chloride: 102 mEq/L (ref 96–112)
Creatinine, Ser: 0.34 mg/dL — ABNORMAL LOW (ref 0.40–1.20)
GFR: 103.63 mL/min (ref >60.00)
Glucose, Bld: 103 mg/dL — ABNORMAL HIGH (ref 70–99)
Potassium: 3.6 mEq/L (ref 3.5–5.1)
Sodium: 138 mEq/L (ref 135–145)

## 2021-07-03 LAB — CBC WITH DIFFERENTIAL/PLATELET
Basophils Absolute: 0 10*3/uL (ref 0.0–0.1)
Basophils Relative: 1 % (ref 0.0–3.0)
Eosinophils Absolute: 0.3 10*3/uL (ref 0.0–0.7)
Eosinophils Relative: 5.9 % — ABNORMAL HIGH (ref 0.0–5.0)
HCT: 36.2 % (ref 36.0–46.0)
Hemoglobin: 12 g/dL (ref 12.0–15.0)
Lymphocytes Relative: 38 % (ref 12.0–46.0)
Lymphs Abs: 1.9 10*3/uL (ref 0.7–4.0)
MCHC: 33.2 g/dL (ref 30.0–36.0)
MCV: 91.2 fl (ref 78.0–100.0)
Monocytes Absolute: 0.5 10*3/uL (ref 0.1–1.0)
Monocytes Relative: 9.5 % (ref 3.0–12.0)
Neutro Abs: 2.2 10*3/uL (ref 1.4–7.7)
Neutrophils Relative %: 45.6 % (ref 43.0–77.0)
Platelets: 144 10*3/uL — ABNORMAL LOW (ref 150.0–400.0)
RBC: 3.97 Mil/uL (ref 3.87–5.11)
RDW: 13.4 % (ref 11.5–15.5)
WBC: 4.9 10*3/uL (ref 4.0–10.5)

## 2021-07-03 LAB — URIC ACID: Uric Acid, Serum: 6.6 mg/dL (ref 2.4–7.0)

## 2021-07-03 LAB — MAGNESIUM: Magnesium: 1.6 mg/dL (ref 1.5–2.5)

## 2021-07-03 LAB — VITAMIN B12: Vitamin B-12: 688 pg/mL (ref 211–911)

## 2021-07-03 MED ORDER — LEVOCETIRIZINE DIHYDROCHLORIDE 5 MG PO TABS: 5.0000 mg | ORAL_TABLET | Freq: Every evening | ORAL | 1 refills | Status: DC

## 2021-07-03 NOTE — Progress Notes (Signed)
Subjective:    Patient ID: Renee Watts, female    DOB: 10/14/1950, 70 y.o.   MRN: [id number]  Chief Complaint  Patient presents with   Follow-up    BP, DM Wants to have ears looked at, states she has been off balance for about 1 week.  Pt accompanied by her granddaughter, Caryl Pina.  HPI Patient was seen today for f/u and acute concern.  Pt feeling dizzy x 4-5 days.  Concerned something going on with her ears.  States typically feels like this when she gets an ear infection.  Pt denies HA, fever, sore throat, facial pain/pressure, ear pain/pressure.  Pt has not taken meds this am as she has yet to eat.  FSBS 140 this am.  Has been 124 and 118 per memory.  Pt eating at [employer] several times per wk.  Pt endorses L lateral knee pain worse at night.  Tylenol not helping.  H/o L TKR 6 yrs ago and R TKR 18 yrs ago.  Past Medical History:  Diagnosis Date   Allergy    Benign paroxysmal positional vertigo 06/25/2013   dx with prior PCP, s/p labs, mri and vestibular rehab; nasal spray for etd seemed to help   Blood in stool 03/11/2012   Dependent edema    Bilateral   Depression    GERD (gastroesophageal reflux disease)    Hyperlipidemia    Low back pain    sciatica; sees orthopedic specialist ([facility])for this and shoulder tendinitis   Obesity, diabetes, and hypertension syndrome ([place])    Osteoarthritis, knee    Bilateral, s/p 3 total knee replacement surgerues on right, last 2005. Dr. Tonita Cong   Pellegrini-Steida syndrome    Myositis ossificans   Seasonal allergies    Shingles Jul 10 2012   LESIONS CLEARED BUT NOT TOLERATING ANY THING TIGHT AGAINST BODY--SHINGLES WERE AROSS BACK AND ABDOMEN    No Known Allergies  ROS General: Denies fever, chills, night sweats, changes in weight, changes in appetite HEENT: Denies headaches, ear pain, changes in vision, rhinorrhea, sore throat  + ear pruritis, dizziness CV: Denies CP, palpitations, SOB, orthopnea Pulm: Denies SOB, cough,  wheezing GI: Denies abdominal pain, nausea, vomiting, diarrhea, constipation GU: Denies dysuria, hematuria, frequency, vaginal discharge Msk: Denies muscle cramps, joint pains +L knee pain Neuro: Denies weakness +numbness, tingling of R thigh Skin: Denies rashes, bruising Psych: Denies depression, anxiety, hallucinations      Objective:    Blood pressure (!) 148/79, pulse (!) 55, temperature 98.3 F (36.8 C), temperature source Oral, weight 167 lb 12.8 oz (76.1 kg), SpO2 99 %.  Gen. Pleasant, well-nourished, in no distress, normal affect   HEENT: [place]/AT, face symmetric, conjunctiva clear, no scleral icterus, PERRLA, EOMI, nares patent without drainage, pharynx without erythema or exudate.  Tms nml b/l. Neck: No JVD, no thyromegaly, no carotid bruits Lungs: no accessory muscle use, CTAB, no wheezes or rales Cardiovascular: RRR, no m/r/g, R ankle trace edema Musculoskeletal: No deformities, no cyanosis or clubbing, normal tone Neuro:  A&Ox3, CN II-XII intact, normal gait Skin:  Warm, no lesions.  Chronic venous stasis changes of b/l LEs   Wt Readings from Last 3 Encounters:  07/03/21 167 lb 12.8 oz (76.1 kg)  05/03/21 171 lb (77.6 kg)  03/02/21 156 lb 6.4 oz (70.9 kg)    Lab Results  Component Value Date   WBC 5.5 06/27/2020   HGB 12.7 06/27/2020   HCT 38.3 06/27/2020   PLT 148 06/27/2020   GLUCOSE 181 (H)  06/27/2020   CHOL 205 (H) 06/27/2020   TRIG 132 06/27/2020   HDL 39 (L) 06/27/2020   LDLDIRECT 137.8 10/12/2013   LDLCALC 140 (H) 06/27/2020   ALT 27 06/27/2020   AST 21 06/27/2020   NA 140 06/27/2020   K 4.4 06/27/2020   CL 105 06/27/2020   CREATININE 0.42 (L) 06/27/2020   BUN 19 06/27/2020   CO2 29 06/27/2020   TSH 1.20 05/10/2015   INR 0.94 01/05/2013   HGBA1C 6.1 (A) 05/03/2021   MICROALBUR 0.5 03/11/2014    Assessment/Plan:  Essential hypertension  -elevated.  Typically controlled in clinic, 128/78 on 05/03/21 -pt encouraged to take meds this  am. -lifestyle modifications strongly encouraged -continue lisinopril 20 mg daily, lasix 20 mg daily, klor-con 20 mEq daily - Plan: Basic metabolic panel  Type II diabetes mellitus with neurological manifestations (HCC)  -controlled -Hgb A1C 6.1% on 05/03/21 -continue Metformin 1000 mg BID -lifestyle modifications encouraged - Plan: Lipid panel  Venous stasis of both lower extremities -supportive care including elevating Les, compression socks, TED hose, sodium intake reduction -f/u with vascular   Paresthesia  - Plan: Basic metabolic panel, TSH, Vitamin B12, Magnesium, CBC with Differential/Platelet  Edema of right ankle  -elevate LEs when sitting, compression socks or TED hose, decreased sodium intake. - Plan: Uric Acid, CBC with Differential/Platelet  Seasonal allergies  - Plan: levocetirizine (XYZAL ALLERGY 24HR) 5 MG tablet  Chronic pain of left knee -continue supportive care -Encouraged to use OTC voltaren gel -continue Tramadol 50 mg prn -advised to schedule f/u with Ortho, Dr. Rush Farmer given h/o b/l TKR  Right thigh pain -discussed possible causes including electrolyte abnormalities, referred pain from knee -supportive care  History of total bilateral knee replacement (TKR)  F/u in the next few months, sooner if needed.  Grier Mitts, MD

## 2021-07-03 NOTE — Patient Instructions (Addendum)
You can also try Nurtibiotic ear drops with tea tree oil and grapefruit seed extract for your dizziness.  They can be found at your local health food store or online.  Avoid eating fast food several days out of the wk.  Fast food is loaded with salt.  You can use topical creams such as Voltaern gel or biofreeze for you knee pain.  They can be found on the shelf at your local drugstore.

## 2021-08-03 ENCOUNTER — Ambulatory Visit (INDEPENDENT_AMBULATORY_CARE_PROVIDER_SITE_OTHER): Payer: HMO

## 2021-08-03 ENCOUNTER — Ambulatory Visit (INDEPENDENT_AMBULATORY_CARE_PROVIDER_SITE_OTHER): Payer: HMO | Admitting: Family Medicine

## 2021-08-03 ENCOUNTER — Encounter: Payer: Self-pay | Admitting: Family Medicine

## 2021-08-03 ENCOUNTER — Other Ambulatory Visit: Payer: Self-pay

## 2021-08-03 VITALS — BP 118/72 | HR 65 | Temp 98.7°F | Wt 170.4 lb

## 2021-08-03 DIAGNOSIS — R1031 Right lower quadrant pain: Secondary | ICD-10-CM | POA: Diagnosis not present

## 2021-08-03 DIAGNOSIS — M79652 Pain in left thigh: Secondary | ICD-10-CM | POA: Diagnosis not present

## 2021-08-03 DIAGNOSIS — Z23 Encounter for immunization: Secondary | ICD-10-CM

## 2021-08-03 DIAGNOSIS — M79651 Pain in right thigh: Secondary | ICD-10-CM | POA: Diagnosis not present

## 2021-08-03 MED ORDER — TRAMADOL HCL 50 MG PO TABS: 100.0000 mg | ORAL_TABLET | Freq: Three times a day (TID) | ORAL | 0 refills | Status: AC | PRN

## 2021-08-03 NOTE — Progress Notes (Signed)
Subjective:    Patient ID: Renee Watts, female    DOB: 1951-08-07, 70 y.o.   MRN: [id number]  Chief Complaint  Patient presents with   Pain    Right leg pain on inside of thigh up to groin. Going on since before LOV, has to take a moment to get going after standing. Went to Dr Ninfa Linden after last OV .    Medication Refill    Question about replacing gabapentin    HPI Patient was seen today for ongoing concern.  Pt with R medial thigh and groin pain.  Pt states pain ongoing x a while. Pain is 10/10, occurs if sits longer than 30 min then gets up, if sits on a bar stool, or if tries to roll over at night.  Pt s/p R THR 2014.  Seen by Ortho, felt like hip was non-contributory.  OTC Tylenol and topical medications do not provide relief.  Tramadol 50 mg eased the pain as well as hydrocodone.  Pain affecting pt's quality of life.  Past Medical History:  Diagnosis Date   Allergy    Benign paroxysmal positional vertigo 06/25/2013   dx with prior PCP, s/p labs, mri and vestibular rehab; nasal spray for etd seemed to help   Blood in stool 03/11/2012   Dependent edema    Bilateral   Depression    GERD (gastroesophageal reflux disease)    Hyperlipidemia    Low back pain    sciatica; sees orthopedic specialist ([facility])for this and shoulder tendinitis   Obesity, diabetes, and hypertension syndrome ([place])    Osteoarthritis, knee    Bilateral, s/p 3 total knee replacement surgerues on right, last 2005. Dr. Tonita Cong   Pellegrini-Steida syndrome    Myositis ossificans   Seasonal allergies    Shingles Jul 10 2012   LESIONS CLEARED BUT NOT TOLERATING ANY THING TIGHT AGAINST BODY--SHINGLES WERE AROSS BACK AND ABDOMEN    No Known Allergies  ROS General: Denies fever, chills, night sweats, changes in weight, changes in appetite HEENT: Denies headaches, ear pain, changes in vision, rhinorrhea, sore throat CV: Denies CP, palpitations, SOB, orthopnea Pulm: Denies SOB, cough, wheezing GI:  Denies abdominal pain, nausea, vomiting, diarrhea, constipation GU: Denies dysuria, hematuria, frequency, vaginal discharge Msk: Denies muscle cramps, joint pains  +R groin and thigh pain Neuro: Denies weakness, numbness, tingling Skin: Denies rashes, bruising Psych: Denies depression, anxiety, hallucinations      Objective:    Blood pressure 118/72, pulse 65, temperature 98.7 F (37.1 C), temperature source Oral, weight 170 lb 6.4 oz (77.3 kg), SpO2 96 %.  Gen. Pleasant, well-nourished, in no distress, normal affect   HEENT: [place]/AT, face symmetric, conjunctiva clear, no scleral icterus, PERRLA, EOMI, nares patent without drainage Lungs: no accessory muscle use, CTAB, no wheezes or rales Cardiovascular: RRR, no m/r/g, decreased R PT pulse, Normal pulses in LLE.  Mild edema in RLE. Musculoskeletal: TTP of R medial thigh from knee into R groin.  No calf tenderness.  No inguinal lymphadenopathy.  + log roll on R, pain with abduction of R LE, straight leg raise.  Unable to do FADIR and FABER 2/2 pt comfort.  No deformities, no cyanosis or clubbing, normal tone Neuro:  A&Ox3, CN II-XII intact, normal gait Skin:  Warm, dry, intact.  Hyperpigmentation/venous stasis changes of bilateral LEs.   Wt Readings from Last 3 Encounters:  08/03/21 170 lb 6.4 oz (77.3 kg)  07/03/21 167 lb 12.8 oz (76.1 kg)  05/03/21 171 lb (77.6 kg)  Lab Results  Component Value Date   WBC 4.9 07/03/2021   HGB 12.0 07/03/2021   HCT 36.2 07/03/2021   PLT 144.0 (L) 07/03/2021   GLUCOSE 103 (H) 07/03/2021   CHOL 165 07/03/2021   TRIG 126.0 07/03/2021   HDL 39.40 07/03/2021   LDLDIRECT 137.8 10/12/2013   LDLCALC 101 (H) 07/03/2021   ALT 27 06/27/2020   AST 21 06/27/2020   NA 138 07/03/2021   K 3.6 07/03/2021   CL 102 07/03/2021   CREATININE 0.34 (L) 07/03/2021   BUN 8 07/03/2021   CO2 30 07/03/2021   TSH 1.10 07/03/2021   INR 0.94 01/05/2013   HGBA1C 6.1 (A) 05/03/2021   MICROALBUR 0.5 03/11/2014     Assessment/Plan:  Right groin pain  -discussed possible causes including  PVD, R THR, venous thrombus, radiculopathy -continue supportive care including heat, stretching, ice, massage, topical analgesics -Discussed obtaining imaging and ultrasound -Limited supply of tramadol as needed -Continue follow-up with Ortho - Plan: DG Hip Unilat W OR W/O Pelvis 2-3 Views Right, VAS [place] LOWER EXTREMITY VENOUS (DVT), traMADol (ULTRAM) 50 MG tablet  Right thigh pain  - Plan: VAS [place] LOWER EXTREMITY VENOUS (DVT), traMADol (ULTRAM) 50 MG tablet  Need for Tdap vaccination  - Plan: Tdap vaccine greater than or equal to 7yo IM  F/u as needed  Grier Mitts, MD

## 2021-08-04 ENCOUNTER — Telehealth: Payer: Self-pay

## 2021-08-04 ENCOUNTER — Other Ambulatory Visit: Payer: Self-pay

## 2021-08-04 ENCOUNTER — Ambulatory Visit (HOSPITAL_COMMUNITY)
Admission: RE | Admit: 2021-08-04 | Discharge: 2021-08-04 | Disposition: A | Discharge status: Home / Self Care | Payer: HMO | Source: Ambulatory Visit | Attending: Family Medicine | Admitting: Family Medicine

## 2021-08-04 ENCOUNTER — Other Ambulatory Visit: Payer: Self-pay | Admitting: Family Medicine

## 2021-08-04 DIAGNOSIS — M79651 Pain in right thigh: Secondary | ICD-10-CM | POA: Diagnosis not present

## 2021-08-04 DIAGNOSIS — Z1231 Encounter for screening mammogram for malignant neoplasm of breast: Secondary | ICD-10-CM

## 2021-08-04 DIAGNOSIS — R1031 Right lower quadrant pain: Secondary | ICD-10-CM | POA: Diagnosis not present

## 2021-08-04 NOTE — Telephone Encounter (Signed)
Preliminary Report  Patient is negative for DVT in right leg

## 2021-09-09 ENCOUNTER — Other Ambulatory Visit: Payer: Self-pay | Admitting: Family Medicine

## 2021-09-15 ENCOUNTER — Ambulatory Visit
Admission: RE | Admit: 2021-09-15 | Discharge: 2021-09-15 | Disposition: A | Discharge status: Home / Self Care | Payer: HMO | Source: Ambulatory Visit | Attending: Family Medicine | Admitting: Family Medicine

## 2021-09-15 DIAGNOSIS — Z1231 Encounter for screening mammogram for malignant neoplasm of breast: Secondary | ICD-10-CM | POA: Diagnosis not present

## 2021-09-20 ENCOUNTER — Ambulatory Visit: Payer: HMO | Admitting: Orthopaedic Surgery

## 2021-09-20 ENCOUNTER — Encounter: Payer: Self-pay | Admitting: Orthopaedic Surgery

## 2021-09-20 DIAGNOSIS — Z9889 Other specified postprocedural states: Secondary | ICD-10-CM | POA: Diagnosis not present

## 2021-09-20 DIAGNOSIS — M79651 Pain in right thigh: Secondary | ICD-10-CM

## 2021-09-20 DIAGNOSIS — M7061 Trochanteric bursitis, right hip: Secondary | ICD-10-CM | POA: Diagnosis not present

## 2021-09-20 MED ORDER — TRAMADOL HCL 50 MG PO TABS: 50.0000 mg | ORAL_TABLET | Freq: Four times a day (QID) | ORAL | 0 refills | Status: DC | PRN

## 2021-09-20 NOTE — Progress Notes (Signed)
The patient is well-known to [place].  I have actually replaced her right hip and her left knee.  She is also had a right knee replacement remotely.  Her right hip was replaced in 2014.  She then sustained an ankle fracture in the last year which was nondisplaced on her right ankle lateral malleolus.  This is slowed her down some.  She has been through therapy and time in the fracture to heal up with her ankle.  She still reports some medial thigh pain but overall seems to making some progress.  She says the ankle is doing well.  Her right operative hip moves smoothly and fluidly is because of both knees and her right ankle.  X-rays of the pelvis and right hip from just a month ago showed a well-seated implant with no complicating features.  At this point she does use her cane in her right hand.  I would like her to offload her right calf by using her cane or opposite hand.  We will try some tramadol just for acute pain and I would like to see her back in 4 weeks to see how she is doing overall but no x-rays are needed.

## 2021-09-26 ENCOUNTER — Other Ambulatory Visit: Payer: Self-pay | Admitting: Family Medicine

## 2021-09-26 DIAGNOSIS — J302 Other seasonal allergic rhinitis: Secondary | ICD-10-CM

## 2021-10-11 ENCOUNTER — Ambulatory Visit (INDEPENDENT_AMBULATORY_CARE_PROVIDER_SITE_OTHER): Payer: HMO | Admitting: Family Medicine

## 2021-10-11 ENCOUNTER — Encounter: Payer: Self-pay | Admitting: Family Medicine

## 2021-10-11 VITALS — BP 130/80 | HR 70 | Temp 97.4°F | Wt 170.4 lb

## 2021-10-11 DIAGNOSIS — M25471 Effusion, right ankle: Secondary | ICD-10-CM

## 2021-10-11 DIAGNOSIS — I10 Essential (primary) hypertension: Secondary | ICD-10-CM | POA: Diagnosis not present

## 2021-10-11 DIAGNOSIS — E1149 Type 2 diabetes mellitus with other diabetic neurological complication: Secondary | ICD-10-CM

## 2021-10-11 DIAGNOSIS — M79651 Pain in right thigh: Secondary | ICD-10-CM | POA: Diagnosis not present

## 2021-10-11 LAB — POCT GLYCOSYLATED HEMOGLOBIN (HGB A1C): Hemoglobin A1C: 6.5 % — AB (ref 4.0–5.6)

## 2021-10-11 MED ORDER — POTASSIUM CHLORIDE CRYS ER 20 MEQ PO TBCR: EXTENDED_RELEASE_TABLET | ORAL | 3 refills | Status: DC

## 2021-10-11 MED ORDER — FUROSEMIDE 40 MG PO TABS: 20.0000 mg | ORAL_TABLET | Freq: Every day | ORAL | 3 refills | Status: DC

## 2021-10-11 NOTE — Patient Instructions (Addendum)
Your hemoglobin A1c was 6.5% this visit.  It was previously 6.1% on 05/03/2021.  Continue to be mindful of food choices and increase physical activity when able.  A refill on Lasix was sent to the pharmacy, as well as the potassium supplement.

## 2021-10-11 NOTE — Progress Notes (Signed)
Subjective:    Patient ID: Renee Watts, female    DOB: October 15, 1950, 71 y.o.   MRN: [id number]  No chief complaint on file.   HPI Patient was seen today for follow-up on chronic conditions.  Patient seen by Ortho for right groin pain thought 2/2 trochanteric bursitis.  Still having right medial thigh pain.  Notes having to use the covers to pull herself over in bed 2/2 pain/weakness in her leg.  Starting to use cane in opposite hand as suggested by Ortho to offload stress on RLE.  patient notes right ankle edema after a long car ride.  Patient requesting refill on Lasix as she has been out x 1 week.  States blood sugar has been good.  Last hemoglobin A1c was 6.1% on 05/03/2021.  BP elevated today 2/2 increased stress as several family members were recently sick.  Past Medical History:  Diagnosis Date   Allergy    Benign paroxysmal positional vertigo 06/25/2013   dx with prior PCP, s/p labs, mri and vestibular rehab; nasal spray for etd seemed to help   Blood in stool 03/11/2012   Dependent edema    Bilateral   Depression    GERD (gastroesophageal reflux disease)    Hyperlipidemia    Low back pain    sciatica; sees orthopedic specialist ([facility])for this and shoulder tendinitis   Obesity, diabetes, and hypertension syndrome ([place])    Osteoarthritis, knee    Bilateral, s/p 3 total knee replacement surgerues on right, last 2005. Dr. Tonita Cong   Pellegrini-Steida syndrome    Myositis ossificans   Seasonal allergies    Shingles Jul 10 2012   LESIONS CLEARED BUT NOT TOLERATING ANY THING TIGHT AGAINST BODY--SHINGLES WERE AROSS BACK AND ABDOMEN    No Known Allergies  ROS General: Denies fever, chills, night sweats, changes in weight, changes in appetite HEENT: Denies headaches, ear pain, changes in vision, rhinorrhea, sore throat CV: Denies CP, palpitations, SOB, orthopnea Pulm: Denies SOB, cough, wheezing GI: Denies abdominal pain, nausea, vomiting, diarrhea, constipation GU:  Denies dysuria, hematuria, frequency, vaginal discharge Msk: Denies muscle cramps, joint pains  + right medial thigh/groin pain, right ankle edema Neuro: Denies weakness, numbness, tingling Skin: Denies rashes, bruising Psych: Denies depression, anxiety, hallucinations    Objective:    Blood pressure 130/80, pulse 70, temperature (!) 97.4 F (36.3 C), temperature source Oral, weight 170 lb 6.4 oz (77.3 kg), SpO2 99 %.  Gen. Pleasant, well-nourished, in no distress, normal affect   HEENT: [place]/AT, face symmetric, conjunctiva clear, no scleral icterus, PERRLA, EOMI, nares patent without drainage Lungs: no accessory muscle use Cardiovascular: RRR, 1+ pitting edema in right ankle.  Left calf without edema.  No TTP of calf bilaterally.  Negative Homans' sign. Musculoskeletal: No deformities, no cyanosis or clubbing, normal tone Neuro:  A&Ox3, CN II-XII intact, using cane to aid with ambulation Skin:  Warm, no lesions/ rash   Wt Readings from Last 3 Encounters:  10/11/21 170 lb 6.4 oz (77.3 kg)  08/03/21 170 lb 6.4 oz (77.3 kg)  07/03/21 167 lb 12.8 oz (76.1 kg)    Lab Results  Component Value Date   WBC 4.9 07/03/2021   HGB 12.0 07/03/2021   HCT 36.2 07/03/2021   PLT 144.0 (L) 07/03/2021   GLUCOSE 103 (H) 07/03/2021   CHOL 165 07/03/2021   TRIG 126.0 07/03/2021   HDL 39.40 07/03/2021   LDLDIRECT 137.8 10/12/2013   LDLCALC 101 (H) 07/03/2021   ALT 27 06/27/2020   AST 21  06/27/2020   NA 138 07/03/2021   K 3.6 07/03/2021   CL 102 07/03/2021   CREATININE 0.34 (L) 07/03/2021   BUN 8 07/03/2021   CO2 30 07/03/2021   TSH 1.10 07/03/2021   INR 0.94 01/05/2013   HGBA1C 6.5 (A) 10/11/2021   MICROALBUR 0.5 03/11/2014    Assessment/Plan:  Type II diabetes mellitus with neurological manifestations (HCC)  -Hemoglobin A1c this visit 6.5%. -Foot exam at next OFV -continue current medications including metformin 1000 mg twice daily. - Plan: POCT glycosylated hemoglobin (Hb  A1C)  Essential hypertension -Controlled -Continue current medications including lasix 40 mg take 1/2 tab daily, lisinopril 20 mg daily -Continue lifestyle modifications - Plan: furosemide (LASIX) 40 MG tablet  Edema of right ankle -Likely 2/2 recent car ride -Elevation, support, compression socks or TED hose -We will restart Lasix - Plan: furosemide (LASIX) 40 MG tablet, potassium chloride SA (KLOR-CON M) 20 MEQ tablet  Right thigh pain -Thought 2/2 trochanteric bursitis -Continue supportive care including topical analgesics, heat, stretching, etc. -Patient encouraged to switch use of cane to opposite hand -Discussed exercises to strengthen LEDs and core -Continue follow-up with Ortho -For continued symptoms consider imaging and vascular consult  F/u in 3-4 months, sooner if needed  Grier Mitts, MD

## 2021-10-18 ENCOUNTER — Ambulatory Visit: Payer: HMO | Admitting: Orthopaedic Surgery

## 2021-10-18 ENCOUNTER — Encounter: Payer: Self-pay | Admitting: Orthopaedic Surgery

## 2021-10-18 ENCOUNTER — Other Ambulatory Visit: Payer: Self-pay

## 2021-10-18 DIAGNOSIS — M79651 Pain in right thigh: Secondary | ICD-10-CM | POA: Diagnosis not present

## 2021-10-18 DIAGNOSIS — M7061 Trochanteric bursitis, right hip: Secondary | ICD-10-CM | POA: Diagnosis not present

## 2021-10-18 DIAGNOSIS — Z9889 Other specified postprocedural states: Secondary | ICD-10-CM

## 2021-10-18 NOTE — Progress Notes (Signed)
The patient comes in today reporting that she is doing better after offloading her right hip with her cane.  I replaced this hip in 2014 and recent x-rays showed a well-seated implant.  She does have known anterolisthesis at L3 on L4 and is seen Dr. Ernestina Patches in the past with interventions in her spine.  She has been having some low back pain to the right side and is continuing to have right thigh pain and some groin pain on the right side.  On my exam today her right hip still moves smoothly and fluidly and she has good strength in her thigh.  She said she has been working on blood glucose control and doing well with that with cutting sweets out of her diet.  She has had to start a diuretic again due to lower extremity edema and that is helping now she is on potassium because of that.  Overall though she feels like she is making progress with her leg strength but she is still having the pain that she is experiencing.  I did look at the MRI again from 2021 of her lumbar spine.  Certainly there is potential for the right L3 nerve root to be irritated and this could be causing the pain she is experiencing.  At this point I would like to send her to Dr. Ernestina Patches for considering a right-sided L3 epidural steroid injection to see how this helps with her symptoms.  She agrees with this treatment plan.  I will see her back myself in about 6 weeks.

## 2021-10-18 NOTE — Addendum Note (Signed)
Addended by: Robyne Peers on: 10/18/2021 10:58 AM   Modules accepted: Orders

## 2021-10-20 ENCOUNTER — Telehealth: Payer: Self-pay | Admitting: Physical Medicine and Rehabilitation

## 2021-10-20 DIAGNOSIS — F411 Generalized anxiety disorder: Secondary | ICD-10-CM

## 2021-10-20 MED ORDER — DIAZEPAM 5 MG PO TABS: ORAL_TABLET | ORAL | 0 refills | Status: DC

## 2021-10-20 NOTE — Telephone Encounter (Signed)
Patient called. She would like to know if she would have medication to take before coming in Monday. Her call back number is [phone]

## 2021-10-20 NOTE — Telephone Encounter (Signed)
Pre-procedure diazepam ordered for pre-operative anxiety. Done.

## 2021-10-23 ENCOUNTER — Encounter: Payer: Self-pay | Admitting: Physical Medicine and Rehabilitation

## 2021-10-23 ENCOUNTER — Ambulatory Visit: Payer: Self-pay

## 2021-10-23 ENCOUNTER — Other Ambulatory Visit: Payer: Self-pay

## 2021-10-23 ENCOUNTER — Ambulatory Visit (INDEPENDENT_AMBULATORY_CARE_PROVIDER_SITE_OTHER): Payer: HMO | Admitting: Physical Medicine and Rehabilitation

## 2021-10-23 VITALS — BP 117/70 | HR 75

## 2021-10-23 DIAGNOSIS — M5416 Radiculopathy, lumbar region: Secondary | ICD-10-CM

## 2021-10-23 MED ORDER — DEXAMETHASONE SODIUM PHOSPHATE 10 MG/ML IJ SOLN
15.0000 mg | Freq: Once | INTRAMUSCULAR | Status: AC
  Administered 2021-10-23: 15 mg

## 2021-10-23 NOTE — Patient Instructions (Signed)

## 2021-10-23 NOTE — Progress Notes (Signed)
Pt state lower back pain mostly on her right side. Pt state sitting and bending makes the pain worse. Pt state heat and pain meds to help ease her pain.  Numeric Pain Rating Scale and Functional Assessment Average Pain 8   In the last MONTH (on 0-10 scale) has pain interfered with the following?  1. General activity like being  able to carry out your everyday physical activities such as walking, climbing stairs, carrying groceries, or moving a chair?  Rating(10)   +Driver, -BT, -Dye Allergies.

## 2021-10-23 NOTE — Procedures (Signed)
Lumbosacral Transforaminal Epidural Steroid Injection - Sub-Pedicular Approach with Fluoroscopic Guidance  Patient: Renee Watts      Date of Birth: 1951/06/17 MRN: [id number] PCP: Billie Ruddy, MD      Visit Date: 10/23/2021   Universal Protocol:    Date/Time: 10/23/2021  Consent Given By: the patient  Position: PRONE  Additional Comments: Vital signs were monitored before and after the procedure. Patient was prepped and draped in the usual sterile fashion. The correct patient, procedure, and site was verified.   Injection Procedure Details:   Procedure diagnoses: Lumbar radiculopathy [M54.16]    Meds Administered:  Meds ordered this encounter  Medications   dexamethasone (DECADRON) injection 15 mg    Laterality: Right  Location/Site: L3  Needle:5.0 in., 22 ga.  Short bevel or Quincke spinal needle  Needle Placement: Transforaminal  Findings:    -Comments: Excellent flow of contrast along the nerve, nerve root and into the epidural space.  Procedure Details: After squaring off the end-plates to get a true AP view, the C-arm was positioned so that an oblique view of the foramen as noted above was visualized. The target area is just inferior to the "nose of the scotty dog" or sub pedicular. The soft tissues overlying this structure were infiltrated with 2-3 ml. of 1% Lidocaine without Epinephrine.  The spinal needle was inserted toward the target using a "trajectory" view along the fluoroscope beam.  Under AP and lateral visualization, the needle was advanced so it did not puncture dura and was located close the 6 O'Clock position of the pedical in AP tracterory. Biplanar projections were used to confirm position. Aspiration was confirmed to be negative for CSF and/or blood. A 1-2 ml. volume of Isovue-250 was injected and flow of contrast was noted at each level. Radiographs were obtained for documentation purposes.   After attaining the desired flow of contrast  documented above, a 0.5 to 1.0 ml test dose of 0.25% Marcaine was injected into each respective transforaminal space.  The patient was observed for 90 seconds post injection.  After no sensory deficits were reported, and normal lower extremity motor function was noted,   the above injectate was administered so that equal amounts of the injectate were placed at each foramen (level) into the transforaminal epidural space.   Additional Comments:  The patient tolerated the procedure well Dressing: 2 x 2 sterile gauze and Band-Aid    Post-procedure details: Patient was observed during the procedure. Post-procedure instructions were reviewed.  Patient left the clinic in stable condition.

## 2021-10-23 NOTE — Progress Notes (Signed)
Renee Watts - 71 y.o. female MRN [id number]  Date of birth: 11-19-1950  Office Visit Note: Visit Date: 10/23/2021 PCP: Billie Ruddy, MD Referred by: Billie Ruddy, MD  Subjective: Chief Complaint  Patient presents with   Lower Back - Pain   HPI:  Renee Watts is a 71 y.o. female who comes in today at the request of Dr. Jean Rosenthal for planned Right L3-4 Lumbar Transforaminal epidural steroid injection with fluoroscopic guidance.  The patient has failed conservative care including home exercise, medications, time and activity modification.  This injection will be diagnostic and hopefully therapeutic.  Please see requesting physician notes for further details and justification.   ROS Otherwise per HPI.  Assessment & Plan: Visit Diagnoses:    ICD-10-CM   1. Lumbar radiculopathy  M54.16 XR C-ARM NO REPORT    Epidural Steroid injection    dexamethasone (DECADRON) injection 15 mg      Plan: No additional findings.   Meds & Orders:  Meds ordered this encounter  Medications   dexamethasone (DECADRON) injection 15 mg    Orders Placed This Encounter  Procedures   XR C-ARM NO REPORT   Epidural Steroid injection    Follow-up: Return if symptoms worsen or fail to improve.   Procedures: No procedures performed  Lumbosacral Transforaminal Epidural Steroid Injection - Sub-Pedicular Approach with Fluoroscopic Guidance  Patient: Renee Watts      Date of Birth: 1951-06-26 MRN: [id number] PCP: Billie Ruddy, MD      Visit Date: 10/23/2021   Universal Protocol:    Date/Time: 10/23/2021  Consent Given By: the patient  Position: PRONE  Additional Comments: Vital signs were monitored before and after the procedure. Patient was prepped and draped in the usual sterile fashion. The correct patient, procedure, and site was verified.   Injection Procedure Details:   Procedure diagnoses: Lumbar radiculopathy [M54.16]    Meds Administered:  Meds  ordered this encounter  Medications   dexamethasone (DECADRON) injection 15 mg    Laterality: Right  Location/Site: L3  Needle:5.0 in., 22 ga.  Short bevel or Quincke spinal needle  Needle Placement: Transforaminal  Findings:    -Comments: Excellent flow of contrast along the nerve, nerve root and into the epidural space.  Procedure Details: After squaring off the end-plates to get a true AP view, the C-arm was positioned so that an oblique view of the foramen as noted above was visualized. The target area is just inferior to the "nose of the scotty dog" or sub pedicular. The soft tissues overlying this structure were infiltrated with 2-3 ml. of 1% Lidocaine without Epinephrine.  The spinal needle was inserted toward the target using a "trajectory" view along the fluoroscope beam.  Under AP and lateral visualization, the needle was advanced so it did not puncture dura and was located close the 6 O'Clock position of the pedical in AP tracterory. Biplanar projections were used to confirm position. Aspiration was confirmed to be negative for CSF and/or blood. A 1-2 ml. volume of Isovue-250 was injected and flow of contrast was noted at each level. Radiographs were obtained for documentation purposes.   After attaining the desired flow of contrast documented above, a 0.5 to 1.0 ml test dose of 0.25% Marcaine was injected into each respective transforaminal space.  The patient was observed for 90 seconds post injection.  After no sensory deficits were reported, and normal lower extremity motor function was noted,   the above injectate was administered so  that equal amounts of the injectate were placed at each foramen (level) into the transforaminal epidural space.   Additional Comments:  The patient tolerated the procedure well Dressing: 2 x 2 sterile gauze and Band-Aid    Post-procedure details: Patient was observed during the procedure. Post-procedure instructions were  reviewed.  Patient left the clinic in stable condition.    Clinical History: No specialty comments available.     Objective:  VS:  HT:     WT:    BMI:      BP:117/70   HR:75bpm   TEMP: ( )   RESP:  Physical Exam Vitals and nursing note reviewed.  Constitutional:      General: She is not in acute distress.    Appearance: Normal appearance. She is not ill-appearing.  HENT:     Head: Normocephalic and atraumatic.     Right Ear: External ear normal.     Left Ear: External ear normal.  Eyes:     Extraocular Movements: Extraocular movements intact.  Cardiovascular:     Rate and Rhythm: Normal rate.     Pulses: Normal pulses.  Pulmonary:     Effort: Pulmonary effort is normal. No respiratory distress.  Abdominal:     General: There is no distension.     Palpations: Abdomen is soft.  Musculoskeletal:        General: Tenderness present.     Cervical back: Neck supple.     Right lower leg: No edema.     Left lower leg: No edema.     Comments: Patient has good distal strength with no pain over the greater trochanters.  No clonus or focal weakness.  Skin:    Findings: No erythema, lesion or rash.  Neurological:     General: No focal deficit present.     Mental Status: She is alert and oriented to person, place, and time.     Sensory: No sensory deficit.     Motor: No weakness or abnormal muscle tone.     Coordination: Coordination normal.  Psychiatric:        Mood and Affect: Mood normal.        Behavior: Behavior normal.     Imaging: No results found.

## 2021-11-29 ENCOUNTER — Encounter: Payer: Self-pay | Admitting: Orthopaedic Surgery

## 2021-11-29 ENCOUNTER — Ambulatory Visit: Payer: HMO | Admitting: Orthopaedic Surgery

## 2021-11-29 DIAGNOSIS — M79651 Pain in right thigh: Secondary | ICD-10-CM

## 2021-11-29 DIAGNOSIS — Z9889 Other specified postprocedural states: Secondary | ICD-10-CM

## 2021-11-29 DIAGNOSIS — M5416 Radiculopathy, lumbar region: Secondary | ICD-10-CM | POA: Diagnosis not present

## 2021-11-29 MED ORDER — TRAMADOL HCL 50 MG PO TABS: 50.0000 mg | ORAL_TABLET | Freq: Four times a day (QID) | ORAL | 0 refills | Status: DC | PRN

## 2021-11-29 NOTE — Progress Notes (Signed)
The patient comes in today after having a right-sided L3 epidural steroid injection by Dr. Ernestina Patches.  She has been having some right thigh pain and weakness.  She has a right total hip arthroplasty but that is well-seated confirmed on x-rays and clinical exam.  She denies any groin pain.  She does state the injection has helped.  She is 71 years old.  She does ambulate using a cane as well. ? ?She does not seem to be in a lot of discomfort today when examining her right lower extremity.  She has just some slight thigh discomfort and overall looks like she is improved. ? ?She is already on gabapentin.  I will refill her tramadol to take only as needed on occasion.  She can always have a repeat L3 injection of the right side if this wears off.  Right now she is satisfied on how things are going on we can just watch her for now.  All questions and concerns were answered and addressed. ?

## 2021-12-07 ENCOUNTER — Telehealth: Payer: Self-pay | Admitting: Physical Medicine and Rehabilitation

## 2021-12-07 NOTE — Telephone Encounter (Signed)
Pt called requesting a call back from [facility]. Pt states she has an injection and now have spot and unsure of what it is. Can you please call pt and set an appt. Pt phone number is [phone] ?

## 2021-12-08 ENCOUNTER — Other Ambulatory Visit: Payer: Self-pay | Admitting: Family Medicine

## 2021-12-11 ENCOUNTER — Ambulatory Visit (INDEPENDENT_AMBULATORY_CARE_PROVIDER_SITE_OTHER): Payer: HMO | Admitting: Family Medicine

## 2021-12-11 ENCOUNTER — Encounter: Payer: Self-pay | Admitting: Family Medicine

## 2021-12-11 VITALS — BP 143/68 | HR 70 | Temp 99.3°F | Wt 172.0 lb

## 2021-12-11 DIAGNOSIS — I10 Essential (primary) hypertension: Secondary | ICD-10-CM | POA: Diagnosis not present

## 2021-12-11 DIAGNOSIS — L590 Erythema ab igne [dermatitis ab igne]: Secondary | ICD-10-CM

## 2021-12-11 DIAGNOSIS — M79651 Pain in right thigh: Secondary | ICD-10-CM | POA: Diagnosis not present

## 2021-12-11 DIAGNOSIS — E1142 Type 2 diabetes mellitus with diabetic polyneuropathy: Secondary | ICD-10-CM

## 2021-12-11 MED ORDER — GABAPENTIN 300 MG PO CAPS: ORAL_CAPSULE | ORAL | 3 refills | Status: DC

## 2021-12-11 NOTE — Progress Notes (Signed)
Subjective:  ? ? Patient ID: Renee Watts, female    DOB: 1951-03-19, 71 y.o.   MRN: [id number] ? ?Chief Complaint  ?Patient presents with  ? Bleeding/Bruising  ?  Possibly bruising on back around the injection site for steroid. Does not hurt hurt, just concerned. Was sitting with heating pad and husband said it was some red marks on back   ? ? ?HPI ?Patient was seen today for acute concern.  Patient notes rash back times several weeks.  Patient initially thought the area was 2/2 bruising status post back injection 6 weeks ago.  Patient states the area developed a couple weeks after the injection.  Area is nonpainful and nonpruritic without bumps/blisters.  No longer erythematous but dark in color.  Patient also endorses using heating pad regularly 2/2 continued back pain.  Taking Tylenol but still having pain.  Patient also with pain in right thigh.  Has also noticed increased numbness/tingling in lower extremities if eats sugar prior to bed.  Taking gabapentin 300 mg twice daily without drowsiness.  Has increased dosing has provided more relief.  pt stopped taking daily multivitamin, now taking vitamins individually.  Endorses less weakness in lower extremities since changing the medications. ? ?[facility]. ? ?Patient states DM control.  Inquires about weekly injectable medications instead of metformin as she is trying to decrease the amount of pills she takes daily. ? ?Past Medical History:  ?Diagnosis Date  ? Allergy   ? Benign paroxysmal positional vertigo 06/25/2013  ? dx with prior PCP, s/p labs, mri and vestibular rehab; nasal spray for etd seemed to help  ? Blood in stool 03/11/2012  ? Dependent edema   ? Bilateral  ? Depression   ? GERD (gastroesophageal reflux disease)   ? Hyperlipidemia   ? Low back pain   ? sciatica; sees orthopedic specialist ([facility])for this and shoulder tendinitis  ? Obesity, diabetes, and hypertension  syndrome ([place])   ? Osteoarthritis, knee   ? Bilateral, s/p 3 total knee replacement surgerues on right, last 2005. Dr. Tonita Cong  ? Pellegrini-Steida syndrome   ? Myositis ossificans  ? Seasonal allergies   ? Shingles Jul 10 2012  ? LESIONS CLEARED BUT NOT TOLERATING ANY THING TIGHT AGAINST BODY--SHINGLES WERE AROSS BACK AND ABDOMEN  ? ? ?No Known Allergies ? ?ROS ?General: Denies fever, chills, night sweats, changes in weight, changes in appetite ?HEENT: Denies headaches, ear pain, changes in vision, rhinorrhea, sore throat ?CV: Denies CP, palpitations, SOB, orthopnea ?Pulm: Denies SOB, cough, wheezing ?GI: Denies abdominal pain, nausea, vomiting, diarrhea, constipation ?GU: Denies dysuria, hematuria, frequency, vaginal discharge ?Msk: Denies muscle cramps, joint pains ?Neuro:    +LE weakness, numbness, tingling ?Skin: Denies rashe  + ecchymosis ?Psych: Denies depression, anxiety, hallucinations ? ?   ?Objective:  ?  ?Blood pressure (!) 143/68, pulse 70, temperature 99.3 ?F (37.4 ?C), temperature source Oral, weight 172 lb (78 kg), SpO2 100 %. ? ?Gen. Pleasant, well-nourished, in no distress, normal affect   ?HEENT: [place]/AT, face symmetric, conjunctiva clear, no scleral icterus, PERRLA, EOMI, nares patent without drainage ?Lungs: no accessory muscle use, CTAB, no wheezes or rales ?Cardiovascular: RRR, no m/r/g, no peripheral edema ?Musculoskeletal: No deformities, no cyanosis or clubbing, normal tone ?Neuro:  A&Ox3, CN II-XII intact, ambulating with cane ?Skin:  Warm, no lesions. Reticulated hyperpigmented area on b/l lower back  ? ? ?Wt Readings from Last 3 Encounters:  ?10/11/21 170 lb  6.4 oz (77.3 kg)  ?08/03/21 170 lb 6.4 oz (77.3 kg)  ?07/03/21 167 lb 12.8 oz (76.1 kg)  ? ? ?Lab Results  ?Component Value Date  ? WBC 4.9 07/03/2021  ? HGB 12.0 07/03/2021  ? HCT 36.2 07/03/2021  ? PLT 144.0 (L) 07/03/2021  ? GLUCOSE 103 (H) 07/03/2021  ? CHOL 165 07/03/2021  ? TRIG 126.0 07/03/2021  ? HDL 39.40 07/03/2021  ? LDLDIRECT  137.8 10/12/2013  ? LDLCALC 101 (H) 07/03/2021  ? ALT 27 06/27/2020  ? AST 21 06/27/2020  ? NA 138 07/03/2021  ? K 3.6 07/03/2021  ? CL 102 07/03/2021  ? CREATININE 0.34 (L) 07/03/2021  ? BUN 8 07/03/2021  ? CO2 30 07/03/2021  ? TSH 1.10 07/03/2021  ? INR 0.94 01/05/2013  ? HGBA1C 6.5 (A) 10/11/2021  ? MICROALBUR 0.5 03/11/2014  ? ? ?Assessment/Plan: ? ?Diabetic peripheral neuropathy ([place])  ?-Hemoglobin A1c 6 5% on 10/11/2021 ?-We will increase gabapentin to 3 times daily dosing.  Patient to take 300 mg in a.m. and at lunch with evening dose 600 mg. ?-Continue metformin 1000 mg twice daily. ?-Foot exam due at next OFV. ?-Pt schedule yearly diabetic retinopathy screening ?- Plan: gabapentin (NEURONTIN) 300 MG capsule ? ?Erythema ab igne ?-New problem ?-Improving ?-Patient encouraged to avoid using heating pad without a towel or some type of barrier between skin and pad. ? ?Right thigh pain ?-s/p L3 epidural steroid injection 10/23/2021 for history of lumbar radiculopathy ?-Thought 2/2 trochanteric bursitis.  Encouraged to use cane in opposite hand and supportive care including topical analgesics, heat, stretching, etc. ?-Increasing dosing of gabapentin ?-Continue tramadol as needed ?-Continue follow-up with Ortho, Dr. Ninfa Linden ?- Plan: gabapentin (NEURONTIN) 300 MG capsule ? ?Essential hypertension ?-Previously controlled ?-elevated this visit 2/2 automated BP machine ?-Continue current medications including lisinopril 20 mg daily, Lasix 40 mg 1/2 tab daily ?-Continue lifestyle modifications ? ?F/u as needed.  Regular follow-up already scheduled for next month. ? ?Grier Mitts, MD ?

## 2021-12-23 ENCOUNTER — Other Ambulatory Visit: Payer: Self-pay | Admitting: Family Medicine

## 2021-12-23 DIAGNOSIS — J302 Other seasonal allergic rhinitis: Secondary | ICD-10-CM

## 2022-01-02 ENCOUNTER — Telehealth: Payer: Self-pay | Admitting: Orthopaedic Surgery

## 2022-01-02 ENCOUNTER — Other Ambulatory Visit: Payer: Self-pay | Admitting: Orthopaedic Surgery

## 2022-01-02 MED ORDER — ACETAMINOPHEN-CODEINE #3 300-30 MG PO TABS: 1.0000 | ORAL_TABLET | Freq: Three times a day (TID) | ORAL | 0 refills | Status: DC | PRN

## 2022-01-02 NOTE — Telephone Encounter (Signed)
Please advise 

## 2022-01-02 NOTE — Telephone Encounter (Signed)
Patient called in saying she fell and is in pain would like to know if Dr. Ninfa Linden can see her sometime this week if not next week next opening is not until the 23rd, she is requesting pain medication to be sent to pharmacy but if she has to come in for an appt she will. ?

## 2022-01-02 NOTE — Telephone Encounter (Signed)
Can we work her in this week? What do you think? ?

## 2022-01-02 NOTE — Telephone Encounter (Signed)
Called and worked in Monday. Pt was also informed about medication ?

## 2022-01-08 ENCOUNTER — Encounter: Payer: Self-pay | Admitting: Orthopaedic Surgery

## 2022-01-08 ENCOUNTER — Ambulatory Visit: Payer: HMO | Admitting: Orthopaedic Surgery

## 2022-01-08 ENCOUNTER — Ambulatory Visit (INDEPENDENT_AMBULATORY_CARE_PROVIDER_SITE_OTHER): Payer: HMO

## 2022-01-08 DIAGNOSIS — Z96652 Presence of left artificial knee joint: Secondary | ICD-10-CM | POA: Diagnosis not present

## 2022-01-08 MED ORDER — ACETAMINOPHEN-CODEINE #3 300-30 MG PO TABS: 1.0000 | ORAL_TABLET | Freq: Three times a day (TID) | ORAL | 0 refills | Status: DC | PRN

## 2022-01-08 NOTE — Progress Notes (Signed)
The patient is a 71 year old female who fell last Monday night injuring her left knee.  She is ambulate with a cane and said the knee is still sore and it really bent backwards during the fall.  She does have a history of a left total knee arthroplasty the we replaced in 2014.  She is not taking medications for pain.  She tried Tylenol which did not really help. ? ?Examination her left knee does show some bruising but no effusion.  She has good range of motion of the knee and it feels ligamentously stable.  There is some stiffness. ? ?2 views left knee show no acute findings.  The total knee arthroplasty is in good position.  There is no evidence of fracture and there is no effusion. ? ?She will continue to ambulate with her cane and can try intermittent ice and heat for her left knee.  I will send in a one-time prescription for Tylenol 3.  All questions and concerns were answered and addressed.  Follow-up is as needed. ?

## 2022-01-30 ENCOUNTER — Encounter: Payer: Self-pay | Admitting: Family Medicine

## 2022-01-30 DIAGNOSIS — E119 Type 2 diabetes mellitus without complications: Secondary | ICD-10-CM | POA: Diagnosis not present

## 2022-01-30 DIAGNOSIS — H25813 Combined forms of age-related cataract, bilateral: Secondary | ICD-10-CM | POA: Diagnosis not present

## 2022-01-30 LAB — HM DIABETES EYE EXAM

## 2022-02-05 ENCOUNTER — Encounter: Payer: Self-pay | Admitting: Pharmacist

## 2022-02-05 DIAGNOSIS — G720 Drug-induced myopathy: Secondary | ICD-10-CM

## 2022-02-05 NOTE — Progress Notes (Signed)
[name])                                            [place] Team                                        Statin Quality Measure Assessment    02/05/2022  Renee Watts 18-Sep-1950 [id number]  Per review of chart and payor information, patient has a diagnosis of diabetes but is not currently filling a statin prescription.  This places patient into the SUPD (Statin Use In Patients with Diabetes) measure for CMS.    Pravastatin was held last year due to "weakness". Patient has upcoming appointment 02/08/2022.  If deemed therapeutically appropriate, statin therapy could be evaluated (change statins, alternative dosing) or she could have a statin exclusion code associate with the visit.  Associating a statin exclusion code will remove the patient from the measure.  The 10-year ASCVD risk score (Arnett DK, et al., 2019) is: 27%   Values used to calculate the score:     Age: 71 years     Sex: Female     Is Non-Hispanic African American: Yes     Diabetic: Yes     Tobacco smoker: No     Systolic Blood Pressure: 675 mmHg     Is BP treated: Yes     HDL Cholesterol: 39.4 mg/dL     Total Cholesterol: 165 mg/dL 07/03/2021     Component Value Date/Time   CHOL 165 07/03/2021 0938   TRIG 126.0 07/03/2021 0938   HDL 39.40 07/03/2021 0938   CHOLHDL 4 07/03/2021 0938   VLDL 25.2 07/03/2021 0938   LDLCALC 101 (H) 07/03/2021 0938   LDLCALC 140 (H) 06/27/2020 1017   LDLDIRECT 137.8 10/12/2013 1016    Please consider ONE of the following recommendations:  Initiate high intensity statin Atorvastatin '40mg'$  once daily, #90, 3 refills   Rosuvastatin '20mg'$  once daily, #90, 3 refills    Initiate moderate intensity          statin with reduced frequency if prior          statin intolerance 1x weekly, #13, 3 refills   2x weekly, #26, 3 refills   3x weekly, #39, 3 refills    Code for past statin intolerance or  other exclusions  (required annually)   Provider Requirements:  Associate code during an office visit or telehealth encounter  Drug Induced Myopathy G72.0   Myopathy, unspecified G72.9   Myositis, unspecified M60.9   Rhabdomyolysis F16.38   Alcoholic fatty liver G66.5   Cirrhosis of liver K74.69   Prediabetes R73.03   PCOS E28.2   Toxic liver disease, unspecified K71.9        Plan:  Send note to patient prior to upcoming appointment.   Elayne Guerin, PharmD, [place] Clinical Pharmacist [phone]

## 2022-02-06 ENCOUNTER — Telehealth: Payer: Self-pay | Admitting: Physical Medicine and Rehabilitation

## 2022-02-06 ENCOUNTER — Telehealth: Payer: Self-pay | Admitting: Orthopaedic Surgery

## 2022-02-06 ENCOUNTER — Other Ambulatory Visit: Payer: Self-pay | Admitting: Orthopaedic Surgery

## 2022-02-06 MED ORDER — ACETAMINOPHEN-CODEINE 300-30 MG PO TABS
1.0000 | ORAL_TABLET | Freq: Three times a day (TID) | ORAL | 0 refills | Status: DC | PRN
Start: 2022-02-06 — End: 2023-07-29

## 2022-02-06 NOTE — Telephone Encounter (Signed)
Pt called requesting an back injection appt. Pt states she wont be home after 3:30 pm. Please call before 3:30 pm. Pt phone number is [phone].

## 2022-02-06 NOTE — Telephone Encounter (Signed)
Please advise on meds

## 2022-02-06 NOTE — Telephone Encounter (Signed)
Pt called to set an appt for back injection with Dr. Ernestina Patches. Pt is waiting for call to set an appt. Pt is asking in the meantime for pain meds for back pains. Please send to pharmacy on file. Pt phone number is [phone].

## 2022-02-08 ENCOUNTER — Ambulatory Visit: Payer: HMO | Admitting: Family Medicine

## 2022-02-12 ENCOUNTER — Telehealth: Payer: Self-pay

## 2022-02-12 NOTE — Telephone Encounter (Signed)
Pt requested Rx for her appt on 03/15/22, Please Advise

## 2022-02-13 ENCOUNTER — Other Ambulatory Visit: Payer: Self-pay | Admitting: Physical Medicine and Rehabilitation

## 2022-02-13 MED ORDER — DIAZEPAM 5 MG PO TABS: ORAL_TABLET | ORAL | 0 refills | Status: DC

## 2022-02-13 NOTE — Progress Notes (Signed)
Prescription for pre-procedure Valium called in.

## 2022-02-15 ENCOUNTER — Encounter: Payer: Self-pay | Admitting: Family Medicine

## 2022-02-15 ENCOUNTER — Ambulatory Visit (INDEPENDENT_AMBULATORY_CARE_PROVIDER_SITE_OTHER): Payer: HMO | Admitting: Family Medicine

## 2022-02-15 VITALS — BP 148/68 | HR 62 | Temp 98.5°F | Wt 172.6 lb

## 2022-02-15 DIAGNOSIS — M545 Low back pain, unspecified: Secondary | ICD-10-CM

## 2022-02-15 DIAGNOSIS — I10 Essential (primary) hypertension: Secondary | ICD-10-CM | POA: Diagnosis not present

## 2022-02-15 DIAGNOSIS — G8929 Other chronic pain: Secondary | ICD-10-CM | POA: Diagnosis not present

## 2022-02-15 DIAGNOSIS — E1149 Type 2 diabetes mellitus with other diabetic neurological complication: Secondary | ICD-10-CM | POA: Diagnosis not present

## 2022-02-15 DIAGNOSIS — Z96641 Presence of right artificial hip joint: Secondary | ICD-10-CM | POA: Diagnosis not present

## 2022-02-15 DIAGNOSIS — Z96653 Presence of artificial knee joint, bilateral: Secondary | ICD-10-CM | POA: Diagnosis not present

## 2022-02-15 DIAGNOSIS — R630 Anorexia: Secondary | ICD-10-CM

## 2022-02-15 DIAGNOSIS — W19XXXD Unspecified fall, subsequent encounter: Secondary | ICD-10-CM | POA: Diagnosis not present

## 2022-02-15 LAB — POCT GLYCOSYLATED HEMOGLOBIN (HGB A1C): Hemoglobin A1C: 7.1 % — AB (ref 4.0–5.6)

## 2022-02-15 LAB — GLUCOSE, POCT (MANUAL RESULT ENTRY): POC Glucose: 172 mg/dl — AB (ref 70–99)

## 2022-02-15 MED ORDER — TRAMADOL HCL 50 MG PO TABS: 50.0000 mg | ORAL_TABLET | Freq: Three times a day (TID) | ORAL | 0 refills | Status: DC | PRN

## 2022-02-15 MED ORDER — CYCLOBENZAPRINE HCL 5 MG PO TABS: 5.0000 mg | ORAL_TABLET | Freq: Three times a day (TID) | ORAL | 0 refills | Status: DC | PRN

## 2022-02-15 NOTE — Progress Notes (Signed)
lowSubjective:    Patient ID: [name], female    DOB: [birth year], 71 y.o.   MRN: [id number]  Chief Complaint  Patient presents with   Follow-up    DM, swelling on R leg from fall on 01/31/22 and loss of appetite.    HPI Patient was seen today for f/u and acute concern.  Pt fell from standing while in her laundry room on 01/31/2022.  Patient states she fell face forward landing on the right side but had bruises on LLE.  Given history of joint replacements she schedule follow-up with Ortho.  States x-ray of knee was negative and no acute injury noted.  Patient thinks fall aggravated her chronic back pain.  Endorses a nagging hurting across bilateral low back.  Also with weakness in bilateral lower legs/shins with edema.  Thinks edema was exacerbated by travel in car and increased walking during her daughter's graduation in [place].  Denies muscle spasms.  Tylenol has not been helpful.  Patient endorses decreased appetite x2 weeks.  May eat once a day and does not feel hungry.  Denies heartburn, bloating, constipation, increased flatus.  Patient has not been checking blood sugar.  States she can tell when her blood sugar is off as her vision changes.  Seen by ophthalmology 01/30/2022.  Past Medical History:  Diagnosis Date   Allergy    Benign paroxysmal positional vertigo 06/25/2013   dx with prior PCP, s/p labs, mri and vestibular rehab; nasal spray for etd seemed to help   Blood in stool 03/11/2012   Dependent edema    Bilateral   Depression    GERD (gastroesophageal reflux disease)    Hyperlipidemia    Low back pain    sciatica; sees orthopedic specialist ([facility])for this and shoulder tendinitis   Obesity, diabetes, and hypertension syndrome ([place])    Osteoarthritis, knee    Bilateral, s/p 3 total knee replacement surgerues on right, last 2005. Dr. Tonita Cong   Pellegrini-Steida syndrome    Myositis ossificans   Seasonal allergies    Shingles Jul 10 2012   LESIONS CLEARED BUT  NOT TOLERATING ANY THING TIGHT AGAINST BODY--SHINGLES WERE AROSS BACK AND ABDOMEN    No Known Allergies  ROS General: Denies fever, chills, night sweats, changes in weight, changes in appetite HEENT: Denies headaches, ear pain, changes in vision, rhinorrhea, sore throat CV: Denies CP, palpitations, SOB, orthopnea Pulm: Denies SOB, cough, wheezing GI: Denies abdominal pain, nausea, vomiting, diarrhea, constipation GU: Denies dysuria, hematuria, frequency, vaginal discharge Msk: Denies muscle cramps, joint pains +low back pain, b/l LE edema Neuro: Denies weakness, numbness, tingling  +b/l LE weakness Skin: Denies rashes, bruising Psych: Denies depression, anxiety, hallucinations    Objective:    Blood pressure (!) 148/68, pulse 62, temperature 98.5 F (36.9 C), temperature source Oral, weight 172 lb 9.6 oz (78.3 kg), SpO2 98 %.  Gen. Pleasant, well-nourished, in no distress, normal affect   HEENT: [place]/AT, face symmetric, conjunctiva clear, no scleral icterus, PERRLA, EOMI, nares patent without drainage Lungs: no accessory muscle use, CTAB, no wheezes or rales Cardiovascular: RRR, no m/r/g, no peripheral edema Musculoskeletal: TTP of midline lower lumbar spine and paraspinal muscles.  L lumbar paraspinal muscle knot.  No TTP of cervical, thoracic spine.  No deformities, no cyanosis or clubbing, normal tone Neuro:  A&Ox3, CN II-XII intact, slowed gait with cane Skin:  Warm, no lesions/ rash   Wt Readings from Last 3 Encounters:  02/15/22 172 lb 9.6 oz (78.3 kg)  12/11/21 172  lb (78 kg)  10/11/21 170 lb 6.4 oz (77.3 kg)    Lab Results  Component Value Date   WBC 4.9 07/03/2021   HGB 12.0 07/03/2021   HCT 36.2 07/03/2021   PLT 144.0 (L) 07/03/2021   GLUCOSE 103 (H) 07/03/2021   CHOL 165 07/03/2021   TRIG 126.0 07/03/2021   HDL 39.40 07/03/2021   LDLDIRECT 137.8 10/12/2013   LDLCALC 101 (H) 07/03/2021   ALT 27 06/27/2020   AST 21 06/27/2020   NA 138 07/03/2021   K 3.6  07/03/2021   CL 102 07/03/2021   CREATININE 0.34 (L) 07/03/2021   BUN 8 07/03/2021   CO2 30 07/03/2021   TSH 1.10 07/03/2021   INR 0.94 01/05/2013   HGBA1C 6.5 (A) 10/11/2021   MICROALBUR 0.5 03/11/2014    Assessment/Plan:  Chronic bilateral low back pain without sciatica -Acute on chronic pain likely exacerbated by recent fall causing strain of lumbar paraspinal muscles -Continue supportive care including topical analgesics -Start Flexeril 5 mg 3 times daily as needed and tramadol 50 mg Q8 as needed -Continue follow-up with Ortho for back injections -Plan: Flexeril, tramadol  Type II diabetes mellitus with neurological manifestations ([place])  -Controlled -Hemoglobin A1c 6.5% on 10/11/2021 -Hemoglobin A1c and glucose recheck this visit on patient request -Continue current medications -Eye exam up-to-date -Foot exam at next [facility] - Plan: POC Glucose (CBG), POC HgB A1c  Fall from standing, subsequent encounter -Discussed fall prevention -Continue using cane to aid with ambulation  History of total bilateral knee replacement (TKR) -Stable -Continue follow-up with Ortho  History of total hip replacement, right -Stable -Continue follow-up with Ortho  Decreased appetite -Weight stable at 172 LBS -We will have patient monitor weight at home.  For weight loss more have patient follow-up to obtain labs. -Continue to monitor  Essential hypertension -Elevated -Likely 2/2 current back pain -Continue current medications -Lifestyle modifications -Patient encouraged to check BP at home for consistent elevation greater than 140/90 increase lisinopril.  F/u as needed  Grier Mitts, MD

## 2022-02-15 NOTE — Patient Instructions (Signed)
Your A1c was 7.0% and your blood sugar this morning was 172.

## 2022-03-05 ENCOUNTER — Telehealth: Payer: Self-pay | Admitting: Orthopedic Surgery

## 2022-03-05 ENCOUNTER — Other Ambulatory Visit: Payer: Self-pay | Admitting: Family Medicine

## 2022-03-05 DIAGNOSIS — M545 Low back pain, unspecified: Secondary | ICD-10-CM

## 2022-03-05 NOTE — Telephone Encounter (Signed)
Renee Watts has an appointment for an injection 03/15/22.  She would like a call if anything comes available sooner. Thank you!

## 2022-03-07 ENCOUNTER — Encounter: Payer: Self-pay | Admitting: Gastroenterology

## 2022-03-08 ENCOUNTER — Other Ambulatory Visit: Payer: Self-pay | Admitting: Family Medicine

## 2022-03-13 ENCOUNTER — Ambulatory Visit (INDEPENDENT_AMBULATORY_CARE_PROVIDER_SITE_OTHER): Payer: PPO

## 2022-03-13 VITALS — Ht 66.0 in | Wt 172.0 lb

## 2022-03-13 DIAGNOSIS — Z0000 Encounter for general adult medical examination without abnormal findings: Secondary | ICD-10-CM | POA: Diagnosis not present

## 2022-03-13 NOTE — Progress Notes (Signed)
Subjective:   Renee Watts is a 71 y.o. female who presents for Medicare Annual (Subsequent) preventive examination.  Review of Systems    Virtual Visit via Telephone Note  I connected with  [name] on 03/13/22 at  8:45 AM EDT by telephone and verified that I am speaking with the correct person using two identifiers.  Location: Patient: Home Provider: Office Persons participating in the virtual visit: patient/Nurse Health Advisor   I discussed the limitations, risks, security and privacy concerns of performing an evaluation and management service by telephone and the availability of in person appointments. The patient expressed understanding and agreed to proceed.  Interactive audio and video telecommunications were attempted between this nurse and patient, however failed, due to patient having technical difficulties OR patient did not have access to video capability.  We continued and completed visit with audio only.  Some vital signs may be absent or patient reported.   [name], LPN  Cardiac Risk Factors include: advanced age (>41men, >25 women);diabetes mellitus;hypertension     Objective:    Today's Vitals   03/13/22 0845  Weight: 172 lb (78 kg)  Height: 5\' 6"  (1.676 m)   Body mass index is 27.76 kg/m.     03/13/2022    8:54 AM 05/19/2021    7:37 AM 03/09/2021    2:42 PM 03/08/2020   10:43 AM 04/14/2018   10:03 AM 04/08/2018    9:37 AM 04/12/2017   12:13 PM  Advanced Directives  Does Patient Have a Medical Advance Directive? No No Yes No No Yes No  Type of 04/14/2017 of [place];Living will   Healthcare Power of Attorney   Does patient want to make changes to medical advance directive?      No - Patient declined   Copy of Healthcare Power of Attorney in Chart?   No - copy requested      Would patient like information on creating a medical advance directive? No - Patient declined No - Patient declined  No - Patient  declined       Current Medications (verified) Outpatient Encounter Medications as of 03/13/2022  Medication Sig   Accu-Chek Softclix Lancets lancets daily. for testing as directed   acetaminophen (TYLENOL) 650 MG CR tablet Take by mouth.   acetaminophen-codeine (TYLENOL #3) 300-30 MG tablet Take 1-2 tablets by mouth every 8 (eight) hours as needed for moderate pain.   Blood Glucose Monitoring Suppl (ONETOUCH VERIO IQ SYSTEM) W/DEVICE KIT by Does not apply route.   CALCIUM PO Take by mouth daily.   Cyanocobalamin (B-12 PO) Take by mouth daily.   cyclobenzaprine (FLEXERIL) 5 MG tablet Take 1 tablet (5 mg total) by mouth 3 (three) times daily as needed for muscle spasms.   diazepam (VALIUM) 5 MG tablet Take one tablet by mouth with food one hour prior to procedure. May repeat 30 minutes prior if needed.   fluticasone (FLONASE) 50 MCG/ACT nasal spray Place 1 spray into both nostrils daily.   furosemide (LASIX) 40 MG tablet Take 0.5 tablets (20 mg total) by mouth daily.   gabapentin (NEURONTIN) 300 MG capsule Take 1 capsule (300 mg) in a.m. and at lunch.  Take 2 capsules (600 mg) nightly.   ketotifen (ZADITOR) 0.025 % ophthalmic solution Place 1 drop into both eyes as needed (ALLERGIES).   levocetirizine (XYZAL) 5 MG tablet Take 1 tablet (5 mg total) by mouth every evening.   lisinopril (ZESTRIL) 20 MG tablet TAKE  1 TABLET BY MOUTH EVERY DAY   metFORMIN (GLUCOPHAGE) 1000 MG tablet TAKE 1 TABLET BY MOUTH TWICE DAILY WITH MEALS   omeprazole (PRILOSEC) 10 MG capsule Take 10 mg by mouth daily as needed.   OneTouch Delica Lancets 38O MISC Use daily as directed.   ONETOUCH VERIO test strip USE DAILY AS DIRECTED   potassium chloride SA (KLOR-CON M) 20 MEQ tablet TAKE 1 TABLET(20 MEQ) BY MOUTH DAILY   traMADol (ULTRAM) 50 MG tablet Take 1 tablet (50 mg total) by mouth every 8 (eight) hours as needed for severe pain.   VITAMIN D PO Take by mouth daily.   No facility-administered encounter medications on  file as of 03/13/2022.    Allergies (verified) Patient has no known allergies.   History: Past Medical History:  Diagnosis Date   Allergy    Benign paroxysmal positional vertigo 06/25/2013   dx with prior PCP, s/p labs, mri and vestibular rehab; nasal spray for etd seemed to help   Blood in stool 03/11/2012   Dependent edema    Bilateral   Depression    GERD (gastroesophageal reflux disease)    Hyperlipidemia    Low back pain    sciatica; sees orthopedic specialist ([facility])for this and shoulder tendinitis   Obesity, diabetes, and hypertension syndrome (Renee Watts)    Osteoarthritis, knee    Bilateral, s/p 3 total knee replacement surgerues on right, last 2005. Dr. Tonita Cong   Pellegrini-Steida syndrome    Myositis ossificans   Seasonal allergies    Shingles Jul 10 2012   LESIONS CLEARED BUT NOT TOLERATING ANY THING TIGHT AGAINST BODY--SHINGLES WERE AROSS BACK AND ABDOMEN   Past Surgical History:  Procedure Laterality Date   ABDOMINAL HYSTERECTOMY  1984   BLADDER SURGERY  03/2017   bladder tac per pt   COLONOSCOPY     left shoulder steroid injection     POLYPECTOMY     TONSILLECTOMY  1977   TOTAL HIP ARTHROPLASTY Right 10/10/2012   Procedure: R TOTAL HIP ARTHROPLASTY ANTERIOR APPROACH;  Surgeon: Mcarthur Rossetti, MD;  Location: [facility];  Service: Orthopedics;  Laterality: Right;  Right Total Hip Arthroplasty, Anterior Approach (C-Arm)   TOTAL KNEE ARTHROPLASTY Right    with multiple revisions, 2003, 2005   TOTAL KNEE ARTHROPLASTY Left 01/09/2013   Procedure: LEFT TOTAL KNEE ARTHROPLASTY;  Surgeon: Mcarthur Rossetti, MD;  Location: [facility];  Service: Orthopedics;  Laterality: Left;   TUBAL LIGATION  1978   WISDOM TOOTH EXTRACTION  2013,2014   Family History  Problem Relation Age of Onset   Diabetes Mother    Hypertension Mother    Hypertension Father    Cancer Father        Lung CA   Coronary artery disease Father    Colon cancer Neg Hx    Colon polyps Neg Hx     Esophageal cancer Neg Hx    Rectal cancer Neg Hx    Stomach cancer Neg Hx    Social History   Socioeconomic History   Marital status: Married    Spouse name: Not on file   Number of children: Not on file   Years of education: 12th grade   Highest education level: Not on file  Occupational History    Employer: [employer]  Tobacco Use   Smoking status: Never   Smokeless tobacco: Never  Substance and Sexual Activity   Alcohol use: No   Drug use: No   Sexual activity: Not on file  Other  Topics Concern   Not on file  Social History Narrative   Married, 3 daughters.   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   Nicotine: never   ETOH: no   Drugs: no   Social Determinants of Radio broadcast assistant Strain: Low Risk  (03/13/2022)   Overall Financial Resource Strain (CARDIA)    Difficulty of Paying Living Expenses: Not hard at all  Food Insecurity: No Food Insecurity (03/13/2022)   Hunger Vital Sign    Worried About Running Out of Food in the Last Year: Never true    Ran Out of Food in the Last Year: Never true  Transportation Needs: No Transportation Needs (03/13/2022)   PRAPARE - Hydrologist (Medical): No    Lack of Transportation (Non-Medical): No  Physical Activity: Insufficiently Active (03/13/2022)   Exercise Vital Sign    Days of Exercise per Week: 3 days    Minutes of Exercise per Session: 40 min  Stress: No Stress Concern Present (03/13/2022)   [place]    Feeling of Stress : Not at all  Social Connections: [place] (03/13/2022)   Social Connection and Isolation Panel [NHANES]    Frequency of Communication with Friends and Family: More than three times a week    Frequency of Social Gatherings with Friends and Family: More than three times a week    Attends Religious Services: More than 4 times per year    Active  Member of [employer] or Organizations: Yes    Attends Music therapist: More than 4 times per year    Marital Status: Married       Clinical Intake:  Pre-visit preparation completed: NoNutrition Risk Assessment:  Has the patient had any N/V/D within the last 2 months?  No  Does the patient have any non-healing wounds?  No  Has the patient had any unintentional weight loss or weight gain?  No   Diabetes:  Is the patient diabetic?  Yes  If diabetic, was a CBG obtained today?  No  Did the patient bring in their glucometer from home?  No  How often do you monitor your CBG's? 3 x wk.   Financial Strains and Diabetes Management:  Are you having any financial strains with the device, your supplies or your medication? No .  Does the patient want to be seen by Chronic Care Management for management of their diabetes?  No  Would the patient like to be referred to a Nutritionist or for Diabetic Management?  No   Diabetic Exams:  Diabetic Eye Exam: Completed No. Overdue for diabetic eye exam. Pt has been advised about the importance in completing this exam. A referral has been placed today. Message sent to referral coordinator for scheduling purposes. Advised pt to expect a call from office referred to regarding appt.  Diabetic Foot Exam: Completed No. Pt has been advised about the importance in completing this exam. Pt is scheduled for diabetic foot exam on Followed by PCP.    Diabetic? Yes  Interpreter Needed?: No Activities of Daily Living    03/13/2022    8:51 AM  In your present state of health, do you have any difficulty performing the following activities:  Hearing? 0  Vision? 0  Difficulty concentrating or making decisions? 0  Walking or climbing stairs? 0  Dressing or bathing? 0  Doing errands, shopping? 0  Preparing Food and  eating ? N  Using the Toilet? N  In the past six months, have you accidently leaked urine? N  Do you have problems with loss of bowel  control? N  Managing your Medications? N  Managing your Finances? N  Housekeeping or managing your Housekeeping? N    Patient Care Team: Billie Ruddy, MD as PCP - General (Family Medicine) Plyler, Chauncey Reading, RD as Dietitian (Dietician) Ara Kussmaul, MD (Ophthalmology) Alden Hipp, MD as Consulting Physician (Obstetrics and Gynecology)  Indicate any recent Medical Services you may have received from other than [facility] providers in the past year (date may be approximate).     Assessment:   This is a routine wellness examination for [place].  Hearing/Vision screen Hearing Screening - Comments:: No hearing difficulty Vision Screening - Comments:: Wears glasses. Followed by Dr Katy Fitch  Dietary issues and exercise activities discussed: Exercise limited by: None identified   Goals Addressed               This Visit's Progress     Stay healthy (pt-stated)        Be able to walk better.       Depression Screen    03/13/2022    8:49 AM 12/11/2021   10:17 AM 08/03/2021   11:01 AM 03/09/2021    2:43 PM 03/09/2021    2:41 PM 03/08/2020   10:48 AM 04/14/2018   10:18 AM  PHQ 2/9 Scores  PHQ - 2 Score 0 3 4 0 0 0 0  PHQ- 9 Score  9 10   0     Fall Risk    03/13/2022    8:52 AM 12/11/2021   10:17 AM 08/03/2021   11:01 AM 03/09/2021    2:42 PM 03/08/2020   10:46 AM  Fall Risk   Falls in the past year? $RemoveBe'1 1 1 1 1  'UxNdqZeaS$ Number falls in past yr: 0 0 0 0 0  Injury with Fall? 0 $Remov'1 1 1 'PjWFps$ 0  Comment No injury. Followed by PCP      Risk for fall due to : No Fall Risks Other (Comment)  Impaired balance/gait History of fall(s)  Risk for fall due to: Comment    uses cane   Follow up  Falls evaluation completed  Falls evaluation completed;Education provided Falls evaluation completed;Falls prevention discussed    FALL RISK PREVENTION PERTAINING TO THE HOME:  Any stairs in or around the home? Yes  If so, are there any without handrails? No  Home free of loose throw rugs in walkways, pet  beds, electrical cords, etc? Yes  Adequate lighting in your home to reduce risk of falls? Yes   ASSISTIVE DEVICES UTILIZED TO PREVENT FALLS:  Life alert? No  Use of a cane, walker or w/c? Yes  Grab bars in the bathroom? Yes  Shower chair or bench in shower? No  Elevated toilet seat or a handicapped toilet? Yes  TIMED UP AND GO:  Was the test performed? No . Audio Visit  Cognitive Function:        03/13/2022    8:55 AM 03/08/2020   10:49 AM  6CIT Screen  What Year? 0 points 0 points  What month? 0 points 0 points  What time? 0 points 0 points  Count back from 20 0 points 0 points  Months in reverse 0 points 0 points  Repeat phrase 0 points 0 points  Total Score 0 points 0 points    Immunizations Immunization History  Administered Date(s)  Administered   Fluad Quad(high Dose 65+) 06/27/2020, 05/03/2021   Influenza Split 06/04/2011, 05/07/2012   Influenza Whole 07/08/2007, 05/27/2008, 07/14/2009, 07/18/2010   Influenza, High Dose Seasonal PF 05/10/2015, 05/10/2016, 08/26/2017, 06/11/2018, 05/30/2019   Influenza,inj,Quad PF,6+ Mos 05/26/2013, 06/14/2014   Influenza-Unspecified 05/27/2016   PFIZER(Purple Top)SARS-COV-2 Vaccination 10/19/2019, 11/09/2019, 06/03/2020   Pneumococcal Conjugate-13 05/10/2015   Pneumococcal Polysaccharide-23 07/18/2010, 05/10/2016   Td 07/18/2010   Tdap 08/03/2021    TDAP status: Up to date  Flu Vaccine status: Up to date  Pneumococcal vaccine status: Up to date  Covid-19 vaccine status: Completed vaccines  Qualifies for Shingles Vaccine? Yes   Zostavax completed No   Shingrix Completed?: No.    Education has been provided regarding the importance of this vaccine. Patient has been advised to call insurance company to determine out of pocket expense if they have not yet received this vaccine. Advised may also receive vaccine at local pharmacy or Health Dept. Verbalized acceptance and understanding.  Screening Tests Health Maintenance   Topic Date Due   FOOT EXAM  01/21/2021   COVID-19 Vaccine (4 - Pfizer series) 03/29/2022 (Originally 07/29/2020)   Zoster Vaccines- Shingrix (1 of 2) 06/13/2022 (Originally 03/12/2000)   COLONOSCOPY (Pts 45-59yrs Insurance coverage will need to be confirmed)  03/14/2023 (Originally 02/16/2022)   INFLUENZA VACCINE  03/27/2022   LIPID PANEL  07/03/2022   HEMOGLOBIN A1C  08/17/2022   MAMMOGRAM  09/15/2022   OPHTHALMOLOGY EXAM  01/31/2023   TETANUS/TDAP  08/04/2031   Pneumonia Vaccine 72+ Years old  Completed   DEXA SCAN  Completed   Hepatitis C Screening  Completed   HPV VACCINES  Aged Out    Health Maintenance  Health Maintenance Due  Topic Date Due   FOOT EXAM  01/21/2021    Colorectal cancer screening: Referral to GI placed Patient deferred. Pt aware the office will call re: appt.  Mammogram status: Completed 09/15/21. Repeat every year  Bone Density status: Completed 02/13/21. Results reflect: Bone density results: OSTEOPOROSIS. Repeat every   years.  Lung Cancer Screening: (Low Dose CT Chest recommended if Age 69-80 years, 30 pack-year currently smoking OR have quit w/in 15years.) does not qualify.     Additional Screening:  Hepatitis C Screening: does qualify; Completed 09/09/15  Vision Screening: Recommended annual ophthalmology exams for early detection of glaucoma and other disorders of the eye. Is the patient up to date with their annual eye exam?  Yes  Who is the provider or what is the name of the office in which the patient attends annual eye exams? Dr Katy Fitch If pt is not established with a provider, would they like to be referred to a provider to establish care? No .   Dental Screening: Recommended annual dental exams for proper oral hygiene  Community Resource Referral / Chronic Care Management:  CRR required this visit?  No   CCM required this visit?  No      Plan:     I have personally reviewed and noted the following in the patient's chart:    Medical and social history Use of alcohol, tobacco or illicit drugs  Current medications and supplements including opioid prescriptions.  Functional ability and status Nutritional status Physical activity Advanced directives List of other physicians Hospitalizations, surgeries, and ER visits in previous 12 months Vitals Screenings to include cognitive, depression, and falls Referrals and appointments  In addition, I have reviewed and discussed with patient certain preventive protocols, quality metrics, and best practice recommendations. A written personalized care  plan for preventive services as well as general preventive health recommendations were provided to patient.     Criselda Peaches, LPN   7/57/9728   Nurse Notes: None

## 2022-03-13 NOTE — Patient Instructions (Addendum)
Renee Watts , Thank you for taking time to come for your Medicare Wellness Visit. I appreciate your ongoing commitment to your health goals. Please review the following plan we discussed and let me know if I can assist you in the future.   These are the goals we discussed:  Goals       Blood Pressure < 140/90      DIET - INCREASE WATER INTAKE      Patient will drink 6-8 cups of water per day       Exercise 150 min/wk Moderate Activity      Get back to speed walking and balance       HEMOGLOBIN A1C < 7.0      LDL CALC < 100      Patient Stated      I will continuing walking 20-25 minutes 3 days per week      Stay healthy (pt-stated)      Be able to walk better.        This is a list of the screening recommended for you and due dates:  Health Maintenance  Topic Date Due   Complete foot exam   01/21/2021   COVID-19 Vaccine (4 - Pfizer series) 03/29/2022*   Zoster (Shingles) Vaccine (1 of 2) 06/13/2022*   Colon Cancer Screening  03/14/2023*   Flu Shot  03/27/2022   Lipid (cholesterol) test  07/03/2022   Hemoglobin A1C  08/17/2022   Mammogram  09/15/2022   Eye exam for diabetics  01/31/2023   Tetanus Vaccine  08/04/2031   Pneumonia Vaccine  Completed   DEXA scan (bone density measurement)  Completed   Hepatitis C Screening: USPSTF Recommendation to screen - Ages 41-79 yo.  Completed   HPV Vaccine  Aged Out  *Topic was postponed. The date shown is not the original due date.    Advanced directives: No  Conditions/risks identified: None  Next appointment: Follow up in one year for your annual wellness visit     Preventive Care 65 Years and Older, Female Preventive care refers to lifestyle choices and visits with your health care provider that can promote health and wellness. What does preventive care include? A yearly physical exam. This is also called an annual well check. Dental exams once or twice a year. Routine eye exams. Ask your health care provider how often  you should have your eyes checked. Personal lifestyle choices, including: Daily care of your teeth and gums. Regular physical activity. Eating a healthy diet. Avoiding tobacco and drug use. Limiting alcohol use. Practicing safe sex. Taking low-dose aspirin every day. Taking vitamin and mineral supplements as recommended by your health care provider. What happens during an annual well check? The services and screenings done by your health care provider during your annual well check will depend on your age, overall health, lifestyle risk factors, and family history of disease. Counseling  Your health care provider may ask you questions about your: Alcohol use. Tobacco use. Drug use. Emotional well-being. Home and relationship well-being. Sexual activity. Eating habits. History of falls. Memory and ability to understand (cognition). Work and work Statistician. Reproductive health. Screening  You may have the following tests or measurements: Height, weight, and BMI. Blood pressure. Lipid and cholesterol levels. These may be checked every 5 years, or more frequently if you are over 64 years old. Skin check. Lung cancer screening. You may have this screening every year starting at age 30 if you have a 30-pack-year history of smoking and currently  smoke or have quit within the past 15 years. Fecal occult blood test (FOBT) of the stool. You may have this test every year starting at age 59. Flexible sigmoidoscopy or colonoscopy. You may have a sigmoidoscopy every 5 years or a colonoscopy every 10 years starting at age 108. Hepatitis C blood test. Hepatitis B blood test. Sexually transmitted disease (STD) testing. Diabetes screening. This is done by checking your blood sugar (glucose) after you have not eaten for a while (fasting). You may have this done every 1-3 years. Bone density scan. This is done to screen for osteoporosis. You may have this done starting at age 5. Mammogram. This  may be done every 1-2 years. Talk to your health care provider about how often you should have regular mammograms. Talk with your health care provider about your test results, treatment options, and if necessary, the need for more tests. Vaccines  Your health care provider may recommend certain vaccines, such as: Influenza vaccine. This is recommended every year. Tetanus, diphtheria, and acellular pertussis (Tdap, Td) vaccine. You may need a Td booster every 10 years. Zoster vaccine. You may need this after age 35. Pneumococcal 13-valent conjugate (PCV13) vaccine. One dose is recommended after age 57. Pneumococcal polysaccharide (PPSV23) vaccine. One dose is recommended after age 8. Talk to your health care provider about which screenings and vaccines you need and how often you need them. This information is not intended to replace advice given to you by your health care provider. Make sure you discuss any questions you have with your health care provider. Document Released: 09/09/2015 Document Revised: 05/02/2016 Document Reviewed: 06/14/2015 Elsevier Interactive Patient Education  2017 [place] Prevention in the Home Falls can cause injuries. They can happen to people of all ages. There are many things you can do to make your home safe and to help prevent falls. What can I do on the outside of my home? Regularly fix the edges of walkways and driveways and fix any cracks. Remove anything that might make you trip as you walk through a door, such as a raised step or threshold. Trim any bushes or trees on the path to your home. Use bright outdoor lighting. Clear any walking paths of anything that might make someone trip, such as rocks or tools. Regularly check to see if handrails are loose or broken. Make sure that both sides of any steps have handrails. Any raised decks and porches should have guardrails on the edges. Have any leaves, snow, or ice cleared regularly. Use sand or  salt on walking paths during winter. Clean up any spills in your garage right away. This includes oil or grease spills. What can I do in the bathroom? Use night lights. Install grab bars by the toilet and in the tub and shower. Do not use towel bars as grab bars. Use non-skid mats or decals in the tub or shower. If you need to sit down in the shower, use a plastic, non-slip stool. Keep the floor dry. Clean up any water that spills on the floor as soon as it happens. Remove soap buildup in the tub or shower regularly. Attach bath mats securely with double-sided non-slip rug tape. Do not have throw rugs and other things on the floor that can make you trip. What can I do in the bedroom? Use night lights. Make sure that you have a light by your bed that is easy to reach. Do not use any sheets or blankets that are too big  for your bed. They should not hang down onto the floor. Have a firm chair that has side arms. You can use this for support while you get dressed. Do not have throw rugs and other things on the floor that can make you trip. What can I do in the kitchen? Clean up any spills right away. Avoid walking on wet floors. Keep items that you use a lot in easy-to-reach places. If you need to reach something above you, use a strong step stool that has a grab bar. Keep electrical cords out of the way. Do not use floor polish or wax that makes floors slippery. If you must use wax, use non-skid floor wax. Do not have throw rugs and other things on the floor that can make you trip. What can I do with my stairs? Do not leave any items on the stairs. Make sure that there are handrails on both sides of the stairs and use them. Fix handrails that are broken or loose. Make sure that handrails are as long as the stairways. Check any carpeting to make sure that it is firmly attached to the stairs. Fix any carpet that is loose or worn. Avoid having throw rugs at the top or bottom of the stairs. If  you do have throw rugs, attach them to the floor with carpet tape. Make sure that you have a light switch at the top of the stairs and the bottom of the stairs. If you do not have them, ask someone to add them for you. What else can I do to help prevent falls? Wear shoes that: Do not have high heels. Have rubber bottoms. Are comfortable and fit you well. Are closed at the toe. Do not wear sandals. If you use a stepladder: Make sure that it is fully opened. Do not climb a closed stepladder. Make sure that both sides of the stepladder are locked into place. Ask someone to hold it for you, if possible. Clearly mark and make sure that you can see: Any grab bars or handrails. First and last steps. Where the edge of each step is. Use tools that help you move around (mobility aids) if they are needed. These include: Canes. Walkers. Scooters. Crutches. Turn on the lights when you go into a dark area. Replace any light bulbs as soon as they burn out. Set up your furniture so you have a clear path. Avoid moving your furniture around. If any of your floors are uneven, fix them. If there are any pets around you, be aware of where they are. Review your medicines with your doctor. Some medicines can make you feel dizzy. This can increase your chance of falling. Ask your doctor what other things that you can do to help prevent falls. This information is not intended to replace advice given to you by your health care provider. Make sure you discuss any questions you have with your health care provider. Document Released: 06/09/2009 Document Revised: 01/19/2016 Document Reviewed: 09/17/2014 Elsevier Interactive Patient Education  2017 [employer].

## 2022-03-14 ENCOUNTER — Encounter: Payer: Self-pay | Admitting: Gastroenterology

## 2022-03-15 ENCOUNTER — Ambulatory Visit: Payer: Self-pay

## 2022-03-15 ENCOUNTER — Encounter: Payer: Self-pay | Admitting: Physical Medicine and Rehabilitation

## 2022-03-15 ENCOUNTER — Ambulatory Visit (INDEPENDENT_AMBULATORY_CARE_PROVIDER_SITE_OTHER): Payer: PPO | Admitting: Physical Medicine and Rehabilitation

## 2022-03-15 VITALS — BP 96/62 | HR 63

## 2022-03-15 DIAGNOSIS — M5416 Radiculopathy, lumbar region: Secondary | ICD-10-CM

## 2022-03-15 MED ORDER — METHYLPREDNISOLONE ACETATE 80 MG/ML IJ SUSP
80.0000 mg | Freq: Once | INTRAMUSCULAR | Status: AC
  Administered 2022-03-15: 80 mg

## 2022-03-15 NOTE — Progress Notes (Signed)
Pt state lower back pain. Pt state sitting and bending makes the pain worse. Pt state heat and pain meds to help ease her pain.  Numeric Pain Rating Scale and Functional Assessment Average Pain 8   In the last MONTH (on 0-10 scale) has pain interfered with the following?  1. General activity like being  able to carry out your everyday physical activities such as walking, climbing stairs, carrying groceries, or moving a chair?  Rating(10)   +Driver, -BT, -Dye Allergies.

## 2022-03-15 NOTE — Patient Instructions (Signed)

## 2022-03-16 ENCOUNTER — Other Ambulatory Visit: Payer: Self-pay | Admitting: Family Medicine

## 2022-03-26 NOTE — Procedures (Signed)
Lumbosacral Transforaminal Epidural Steroid Injection - Sub-Pedicular Approach with Fluoroscopic Guidance  Patient: Renee Watts      Date of Birth: [birth year] MRN: [id number] PCP: Billie Ruddy, MD      Visit Date: 03/15/2022   Universal Protocol:    Date/Time: 03/15/2022  Consent Given By: the patient  Position: PRONE  Additional Comments: Vital signs were monitored before and after the procedure. Patient was prepped and draped in the usual sterile fashion. The correct patient, procedure, and site was verified.   Injection Procedure Details:   Procedure diagnoses: Lumbar radiculopathy [M54.16]    Meds Administered:  Meds ordered this encounter  Medications   methylPREDNISolone acetate (DEPO-MEDROL) injection 80 mg    Laterality: Right  Location/Site: L3  Needle:5.0 in., 22 ga.  Short bevel or Quincke spinal needle  Needle Placement: Transforaminal  Findings:    -Comments: Excellent flow of contrast along the nerve, nerve root and into the epidural space.  Procedure Details: After squaring off the end-plates to get a true AP view, the C-arm was positioned so that an oblique view of the foramen as noted above was visualized. The target area is just inferior to the "nose of the scotty dog" or sub pedicular. The soft tissues overlying this structure were infiltrated with 2-3 ml. of 1% Lidocaine without Epinephrine.  The spinal needle was inserted toward the target using a "trajectory" view along the fluoroscope beam.  Under AP and lateral visualization, the needle was advanced so it did not puncture dura and was located close the 6 O'Clock position of the pedical in AP tracterory. Biplanar projections were used to confirm position. Aspiration was confirmed to be negative for CSF and/or blood. A 1-2 ml. volume of Isovue-250 was injected and flow of contrast was noted at each level. Radiographs were obtained for documentation purposes.   After attaining the desired  flow of contrast documented above, a 0.5 to 1.0 ml test dose of 0.25% Marcaine was injected into each respective transforaminal space.  The patient was observed for 90 seconds post injection.  After no sensory deficits were reported, and normal lower extremity motor function was noted,   the above injectate was administered so that equal amounts of the injectate were placed at each foramen (level) into the transforaminal epidural space.   Additional Comments:  No complications occurred Dressing: 2 x 2 sterile gauze and Band-Aid    Post-procedure details: Patient was observed during the procedure. Post-procedure instructions were reviewed.  Patient left the clinic in stable condition.

## 2022-03-26 NOTE — Progress Notes (Signed)
Renee Watts - 71 y.o. female MRN [id number]  Date of birth: Jan 08, 1951  Office Visit Note: Visit Date: 03/15/2022 PCP: Billie Ruddy, MD Referred by: Billie Ruddy, MD  Subjective: Chief Complaint  Patient presents with   Lower Back - Pain   HPI:  Renee Watts is a 71 y.o. female who comes in today at the request of Dr. Jean Rosenthal for planned Right L3-4 Lumbar Transforaminal epidural steroid injection with fluoroscopic guidance.  The patient has failed conservative care including home exercise, medications, time and activity modification.  This injection will be diagnostic and hopefully therapeutic.  Please see requesting physician notes for further details and justification.   ROS Otherwise per HPI.  Assessment & Plan: Visit Diagnoses:    ICD-10-CM   1. Lumbar radiculopathy  M54.16 XR C-ARM NO REPORT    Epidural Steroid injection    methylPREDNISolone acetate (DEPO-MEDROL) injection 80 mg      Plan: No additional findings.   Meds & Orders:  Meds ordered this encounter  Medications   methylPREDNISolone acetate (DEPO-MEDROL) injection 80 mg    Orders Placed This Encounter  Procedures   XR C-ARM NO REPORT   Epidural Steroid injection    Follow-up: Return for visit to requesting provider as needed.   Procedures: No procedures performed  Lumbosacral Transforaminal Epidural Steroid Injection - Sub-Pedicular Approach with Fluoroscopic Guidance  Patient: Renee Watts      Date of Birth: 06/30/1951 MRN: [id number] PCP: Billie Ruddy, MD      Visit Date: 03/15/2022   Universal Protocol:    Date/Time: 03/15/2022  Consent Given By: the patient  Position: PRONE  Additional Comments: Vital signs were monitored before and after the procedure. Patient was prepped and draped in the usual sterile fashion. The correct patient, procedure, and site was verified.   Injection Procedure Details:   Procedure diagnoses: Lumbar radiculopathy  [M54.16]    Meds Administered:  Meds ordered this encounter  Medications   methylPREDNISolone acetate (DEPO-MEDROL) injection 80 mg    Laterality: Right  Location/Site: L3  Needle:5.0 in., 22 ga.  Short bevel or Quincke spinal needle  Needle Placement: Transforaminal  Findings:    -Comments: Excellent flow of contrast along the nerve, nerve root and into the epidural space.  Procedure Details: After squaring off the end-plates to get a true AP view, the C-arm was positioned so that an oblique view of the foramen as noted above was visualized. The target area is just inferior to the "nose of the scotty dog" or sub pedicular. The soft tissues overlying this structure were infiltrated with 2-3 ml. of 1% Lidocaine without Epinephrine.  The spinal needle was inserted toward the target using a "trajectory" view along the fluoroscope beam.  Under AP and lateral visualization, the needle was advanced so it did not puncture dura and was located close the 6 O'Clock position of the pedical in AP tracterory. Biplanar projections were used to confirm position. Aspiration was confirmed to be negative for CSF and/or blood. A 1-2 ml. volume of Isovue-250 was injected and flow of contrast was noted at each level. Radiographs were obtained for documentation purposes.   After attaining the desired flow of contrast documented above, a 0.5 to 1.0 ml test dose of 0.25% Marcaine was injected into each respective transforaminal space.  The patient was observed for 90 seconds post injection.  After no sensory deficits were reported, and normal lower extremity motor function was noted,   the above injectate  was administered so that equal amounts of the injectate were placed at each foramen (level) into the transforaminal epidural space.   Additional Comments:  No complications occurred Dressing: 2 x 2 sterile gauze and Band-Aid    Post-procedure details: Patient was observed during the  procedure. Post-procedure instructions were reviewed.  Patient left the clinic in stable condition.    Clinical History: MRI LUMBAR SPINE WITHOUT CONTRAST     TECHNIQUE:  Multiplanar, multisequence MR imaging of the lumbar spine was  performed. No intravenous contrast was administered.     COMPARISON:  Prior radiograph from 09/08/2019.     FINDINGS:  Segmentation: Standard. Lowest well-formed disc space labeled the  L5-S1 level.     Alignment: 4 mm anterolisthesis of L3 on L4, chronic and facet  mediated. Alignment otherwise normal with preservation of the normal  lumbar lordosis.     Vertebrae: Vertebral body height maintained without acute or chronic  fracture. Bone marrow signal intensity within normal limits. No  worrisome osseous lesions. No abnormal marrow edema.     Conus medullaris and cauda equina: Conus extends to the L1 level.  Conus and cauda equina appear normal.     Paraspinal and other soft tissues: Paraspinous soft tissues within  normal limits. Visualized visceral structures unremarkable.     Disc levels:     T10-11: Mild disc bulge with disc desiccation. Mild facet  hypertrophy. No significant stenosis.     T11-12: Mild disc bulge with disc desiccation. No significant  stenosis.     T12-L1: Negative interspace.  Mild facet hypertrophy.  No stenosis.     L1-2: Negative interspace. Mild facet hypertrophy. No significant  stenosis.     L2-3: Mild disc bulge with disc desiccation. Associated minimal  reactive endplate change. Mild facet and ligament flavum  hypertrophy. No significant spinal stenosis. Foramina remain patent.     L3-4: 4 mm anterolisthesis. Diffuse disc bulge with disc desiccation  and intervertebral disc space narrowing. Associated broad-based  central disc protrusion with slight superior migration of disc  material. Moderate to advanced bilateral facet hypertrophy, slightly  worse on the left. Resultant moderate to severe canal  with left  worse than right lateral recess stenosis. Moderate left with mild  right L3 foraminal narrowing.     L4-5: Degenerative intervertebral disc space narrowing with disc  desiccation and diffuse disc bulge. Superimposed central disc  protrusion with mild superior migration. Moderate left with mild  right facet hypertrophy. Resultant mild to moderate narrowing of the  lateral recesses, left slightly worse than right. Mild left L4  foraminal stenosis. No significant right foraminal encroachment.     L5-S1: Degenerative intervertebral disc space narrowing with disc  desiccation and diffuse disc bulge. Superimposed shallow right  subarticular disc protrusion with slight superior migration (series  6, image 38). Protruding disc closely approximates the descending  right S1 nerve root without frank impingement or displacement. Mild  left greater than right facet hypertrophy. No significant spinal  stenosis. Mild bilateral L5 foraminal stenosis, slightly worse on  the right.     IMPRESSION:  1. 4 mm anterolisthesis of L3 on L4 with associated disc bulge and  facet hypertrophy, resulting in moderate to severe canal with left  worse than right lateral recess stenosis, with moderate left L3  foraminal narrowing.  2. Shallow central disc protrusion and facet hypertrophy at L4-5  with resultant mild to moderate left greater than right lateral  recess stenosis.  3. Shallow right subarticular disc protrusion at  L5-S1, closely  approximating and potentially irritating the descending right S1  nerve root.        Electronically Signed    By: Jeannine Boga M.D.    On: 06/27/2020 03:34     Objective:  VS:  HT:    WT:   BMI:     BP:96/62  HR:63bpm  TEMP: ( )  RESP:  Physical Exam Vitals and nursing note reviewed.  Constitutional:      General: She is not in acute distress.    Appearance: Normal appearance. She is not ill-appearing.  HENT:     Head: Normocephalic and  atraumatic.     Right Ear: External ear normal.     Left Ear: External ear normal.  Eyes:     Extraocular Movements: Extraocular movements intact.  Cardiovascular:     Rate and Rhythm: Normal rate.     Pulses: Normal pulses.  Pulmonary:     Effort: Pulmonary effort is normal. No respiratory distress.  Abdominal:     General: There is no distension.     Palpations: Abdomen is soft.  Musculoskeletal:        General: Tenderness present.     Cervical back: Neck supple.     Right lower leg: No edema.     Left lower leg: No edema.     Comments: Patient has good distal strength with no pain over the greater trochanters.  No clonus or focal weakness.  Skin:    Findings: No erythema, lesion or rash.  Neurological:     General: No focal deficit present.     Mental Status: She is alert and oriented to person, place, and time.     Sensory: No sensory deficit.     Motor: No weakness or abnormal muscle tone.     Coordination: Coordination normal.  Psychiatric:        Mood and Affect: Mood normal.        Behavior: Behavior normal.      Imaging: No results found.

## 2022-04-03 DIAGNOSIS — K219 Gastro-esophageal reflux disease without esophagitis: Secondary | ICD-10-CM | POA: Diagnosis not present

## 2022-04-03 DIAGNOSIS — D509 Iron deficiency anemia, unspecified: Secondary | ICD-10-CM | POA: Diagnosis not present

## 2022-04-03 DIAGNOSIS — I10 Essential (primary) hypertension: Secondary | ICD-10-CM | POA: Diagnosis not present

## 2022-04-03 DIAGNOSIS — G629 Polyneuropathy, unspecified: Secondary | ICD-10-CM | POA: Diagnosis not present

## 2022-04-03 DIAGNOSIS — E876 Hypokalemia: Secondary | ICD-10-CM | POA: Diagnosis not present

## 2022-04-03 DIAGNOSIS — G8929 Other chronic pain: Secondary | ICD-10-CM | POA: Diagnosis not present

## 2022-04-03 DIAGNOSIS — E663 Overweight: Secondary | ICD-10-CM | POA: Diagnosis not present

## 2022-04-03 DIAGNOSIS — E785 Hyperlipidemia, unspecified: Secondary | ICD-10-CM | POA: Diagnosis not present

## 2022-04-03 DIAGNOSIS — E1169 Type 2 diabetes mellitus with other specified complication: Secondary | ICD-10-CM | POA: Diagnosis not present

## 2022-04-03 DIAGNOSIS — E1142 Type 2 diabetes mellitus with diabetic polyneuropathy: Secondary | ICD-10-CM | POA: Diagnosis not present

## 2022-04-03 DIAGNOSIS — E1165 Type 2 diabetes mellitus with hyperglycemia: Secondary | ICD-10-CM | POA: Diagnosis not present

## 2022-04-03 DIAGNOSIS — J309 Allergic rhinitis, unspecified: Secondary | ICD-10-CM | POA: Diagnosis not present

## 2022-04-10 ENCOUNTER — Ambulatory Visit (AMBULATORY_SURGERY_CENTER): Payer: Self-pay

## 2022-04-10 VITALS — Ht 66.0 in | Wt 168.0 lb

## 2022-04-10 DIAGNOSIS — Z86010 Personal history of colonic polyps: Secondary | ICD-10-CM

## 2022-04-10 MED ORDER — PEG 3350-KCL-NA BICARB-NACL 420 G PO SOLR: 4000.0000 mL | Freq: Once | ORAL | 0 refills | Status: AC

## 2022-04-10 NOTE — Progress Notes (Signed)
No egg or soy allergy known to patient  No issues known to pt with past sedation with any surgeries or procedures Patient denies ever being told they had issues or difficulty with intubation  No FH of Malignant Hyperthermia Pt is not on diet pills Pt is not on home 02  Pt is not on blood thinners  Pt denies issues with constipation - patient reports she eats fresh fruits and veggies daily and has no issues with bowel movements No A fib or A flutter Have any cardiac testing pending--NO Pt instructed to use [email or web address] or GoodRx for a price reduction on prep

## 2022-04-12 ENCOUNTER — Other Ambulatory Visit: Payer: Self-pay | Admitting: Family Medicine

## 2022-04-12 DIAGNOSIS — G8929 Other chronic pain: Secondary | ICD-10-CM

## 2022-04-18 ENCOUNTER — Ambulatory Visit (INDEPENDENT_AMBULATORY_CARE_PROVIDER_SITE_OTHER): Payer: HMO | Admitting: Family Medicine

## 2022-04-18 VITALS — BP 124/78 | HR 64 | Temp 97.7°F | Wt 162.0 lb

## 2022-04-18 DIAGNOSIS — R110 Nausea: Secondary | ICD-10-CM | POA: Diagnosis not present

## 2022-04-18 DIAGNOSIS — J0100 Acute maxillary sinusitis, unspecified: Secondary | ICD-10-CM | POA: Diagnosis not present

## 2022-04-18 DIAGNOSIS — R630 Anorexia: Secondary | ICD-10-CM | POA: Diagnosis not present

## 2022-04-18 DIAGNOSIS — E1149 Type 2 diabetes mellitus with other diabetic neurological complication: Secondary | ICD-10-CM | POA: Diagnosis not present

## 2022-04-18 MED ORDER — AMOXICILLIN-POT CLAVULANATE 875-125 MG PO TABS: 1.0000 | ORAL_TABLET | Freq: Two times a day (BID) | ORAL | 0 refills | Status: AC

## 2022-04-18 MED ORDER — METFORMIN HCL ER (MOD) 1000 MG PO TB24: 1000.0000 mg | ORAL_TABLET | Freq: Two times a day (BID) | ORAL | 3 refills | Status: DC

## 2022-04-18 NOTE — Progress Notes (Signed)
Subjective:    Patient ID: Renee Watts, female    DOB: January 31, 1951, 71 y.o.   MRN: [id number]  Chief Complaint  Patient presents with   Hyperglycemia    Watermelon on Sunday, did not eat a lot but was more that a little. Since then readings have been higher. Not eating a lot. [Pressure behind the nose and goes down left side of face to ear.  135 last night, 177 this morning   Blurred Vision    Thinks may be because of elevated levels. Comes randomly, but will use eye drops and will clear up. Is using the drops every other day  Pt accompanied by her grandmother.  HPI Patient was seen today for ongoing concerns.  Patient endorses elevated blood sugar over the weekend.  On Sunday blood sugar was almost 300.  Patient ate mashed potatoes, watermelon, and meat with gravy.  Last night blood sugar was 135.  Patient then ate bread with peanut butter overnight.  Blood sugar this morning was 177.  Patient notes decreased appetite. Feels nauseous if eats too much.  Past Medical History:  Diagnosis Date   Allergy    Benign paroxysmal positional vertigo 06/25/2013   dx with prior PCP, s/p labs, mri and vestibular rehab; nasal spray for etd seemed to help   Blood in stool 03/11/2012   Dependent edema    Bilateral   Depression    GERD (gastroesophageal reflux disease)    on meds   Hyperlipidemia    on meds   Low back pain    sciatica; sees orthopedic specialist ([facility])for this and shoulder tendinitis   Obesity, diabetes, and hypertension syndrome ([place])    on med   Osteoarthritis, knee    Bilateral, s/p 3 total knee replacement surgerues on right, last 2005. Dr. Tonita Cong   Pellegrini-Steida syndrome    Myositis ossificans   Seasonal allergies    Shingles 07/10/2012   LESIONS CLEARED BUT NOT TOLERATING ANY THING TIGHT AGAINST BODY--SHINGLES WERE AROSS BACK AND ABDOMEN    No Known Allergies  ROS General: Denies fever, chills, night sweats, changes in weight, +changes in  appetite HEENT: Denies headaches, ear pain, changes in vision, rhinorrhea, sore throat CV: Denies CP, palpitations, SOB, orthopnea Pulm: Denies SOB, cough, wheezing GI: Denies abdominal pain, nausea, vomiting, diarrhea, constipation  +nausea GU: Denies dysuria, hematuria, frequency, vaginal discharge Msk: Denies muscle cramps, joint pains Neuro: Denies weakness, numbness, tingling Skin: Denies rashes, bruising Psych: Denies depression, anxiety, hallucinations    Objective:    Blood pressure 124/78, pulse 64, temperature 97.7 F (36.5 C), temperature source Oral, weight 162 lb (73.5 kg), SpO2 96 %.   Gen. Pleasant, well-nourished, in no distress, normal affect  HEENT: [place]/AT, face symmetric, conjunctiva clear, no scleral icterus, PERRLA, EOMI, nares patent without drainage, TTP of Maxillary sinuses, pharynx without erythema or exudate. Lungs: no accessory muscle use, CTAB, no wheezes or rales Cardiovascular: RRR, no m/r/g, no peripheral edema Abdomen: BS present, soft, NT/ND Musculoskeletal: No deformities, no cyanosis or clubbing, normal tone Neuro:  A&Ox3, CN II-XII intact, normal gait Skin:  Warm, no lesions/ rash, venous stasis changes of b/l LE.  Wt Readings from Last 3 Encounters:  05/01/22 168 lb (76.2 kg)  04/18/22 162 lb (73.5 kg)  04/10/22 168 lb (76.2 kg)    Lab Results  Component Value Date   WBC 5.9 04/19/2022   HGB 12.7 04/19/2022   HCT 38.1 04/19/2022   PLT 176.0 04/19/2022   GLUCOSE 154 (H) 04/19/2022  CHOL 165 07/03/2021   TRIG 126.0 07/03/2021   HDL 39.40 07/03/2021   LDLDIRECT 137.8 10/12/2013   LDLCALC 101 (H) 07/03/2021   ALT 21 04/19/2022   AST 19 04/19/2022   NA 140 04/19/2022   K 4.0 04/19/2022   CL 102 04/19/2022   CREATININE 0.43 04/19/2022   BUN 10 04/19/2022   CO2 29 04/19/2022   TSH 0.86 04/19/2022   INR 0.94 01/05/2013   HGBA1C 7.1 (A) 02/15/2022   MICROALBUR 0.5 03/11/2014    Assessment/Plan:  Acute maxillary sinusitis,  recurrence not specified  -start abx -supportive care including OTC cough/cold meds prn-obtain ones for pt's with HTN and DM - Plan: amoxicillin-clavulanate (AUGMENTIN) 875-125 MG tablet  Type II diabetes mellitus with neurological manifestations ([place])  -discussed the importance of lifestyle modifications -will d/c metformin and start metformin ER to see if GI symptoms improve - Plan: metFORMIN (GLUMETZA) 1000 MG (MOD) 24 hr tablet  Nausea  -possibly 2/2 metformin.  Also consider hyperglycemia, gerd, viral illness/drainage from current sinusitis - Plan: TSH, T4, Free, CBC with Differential/Platelet, CMP, Lipase  Decreased appetite  - Plan: TSH, T4, Free, CBC with Differential/Platelet, CMP, Lipase   F/u prn  Grier Mitts, MD

## 2022-04-18 NOTE — Patient Instructions (Signed)
A prescription for extended release metformin 1000 mg twice a day was sent to your pharmacy.  A prescription for Augmentin, an antibiotic for your sinus infection was also sent to the pharmacy.

## 2022-04-19 ENCOUNTER — Encounter: Payer: Self-pay | Admitting: Gastroenterology

## 2022-04-19 ENCOUNTER — Other Ambulatory Visit: Payer: HMO

## 2022-04-19 LAB — CBC WITH DIFFERENTIAL/PLATELET
Basophils Absolute: 0 10*3/uL (ref 0.0–0.1)
Basophils Relative: 0.7 % (ref 0.0–3.0)
Eosinophils Absolute: 0.3 10*3/uL (ref 0.0–0.7)
Eosinophils Relative: 5.5 % — ABNORMAL HIGH (ref 0.0–5.0)
HCT: 38.1 % (ref 36.0–46.0)
Hemoglobin: 12.7 g/dL (ref 12.0–15.0)
Lymphocytes Relative: 33.5 % (ref 12.0–46.0)
Lymphs Abs: 2 10*3/uL (ref 0.7–4.0)
MCHC: 33.4 g/dL (ref 30.0–36.0)
MCV: 91.8 fl (ref 78.0–100.0)
Monocytes Absolute: 0.4 10*3/uL (ref 0.1–1.0)
Monocytes Relative: 7.3 % (ref 3.0–12.0)
Neutro Abs: 3.1 10*3/uL (ref 1.4–7.7)
Neutrophils Relative %: 53 % (ref 43.0–77.0)
Platelets: 176 10*3/uL (ref 150.0–400.0)
RBC: 4.15 Mil/uL (ref 3.87–5.11)
RDW: 13.3 % (ref 11.5–15.5)
WBC: 5.9 10*3/uL (ref 4.0–10.5)

## 2022-04-19 LAB — TSH: TSH: 0.86 u[IU]/mL (ref 0.35–5.50)

## 2022-04-19 LAB — COMPREHENSIVE METABOLIC PANEL
ALT: 21 U/L (ref 0–35)
AST: 19 U/L (ref 0–37)
Albumin: 4.2 g/dL (ref 3.5–5.2)
Alkaline Phosphatase: 49 U/L (ref 39–117)
BUN: 10 mg/dL (ref 6–23)
CO2: 29 mEq/L (ref 19–32)
Calcium: 9.7 mg/dL (ref 8.4–10.5)
Chloride: 102 mEq/L (ref 96–112)
Creatinine, Ser: 0.43 mg/dL (ref 0.40–1.20)
GFR: 97.39 mL/min (ref >60.00)
Glucose, Bld: 154 mg/dL — ABNORMAL HIGH (ref 70–99)
Potassium: 4 mEq/L (ref 3.5–5.1)
Sodium: 140 mEq/L (ref 135–145)
Total Bilirubin: 0.5 mg/dL (ref 0.2–1.2)
Total Protein: 8.4 g/dL — ABNORMAL HIGH (ref 6.0–8.3)

## 2022-04-19 LAB — T4, FREE: Free T4: 0.94 ng/dL (ref 0.60–1.60)

## 2022-04-19 LAB — LIPASE: Lipase: 26 U/L (ref 11.0–59.0)

## 2022-04-24 ENCOUNTER — Telehealth: Payer: Self-pay

## 2022-04-24 NOTE — Telephone Encounter (Signed)
PA for metFORMIN 1000 mg tablets started.  Key: [id number]

## 2022-04-27 ENCOUNTER — Telehealth: Payer: Self-pay

## 2022-04-27 NOTE — Telephone Encounter (Signed)
Patient states she still has a lot of mucus/nasal congestion, would like to know if something could be sent in? Or if there is something she can take over the counter?  Also states the metformin has had her in the bathroom a lot.

## 2022-05-01 ENCOUNTER — Ambulatory Visit (AMBULATORY_SURGERY_CENTER): Payer: HMO | Admitting: Gastroenterology

## 2022-05-01 ENCOUNTER — Encounter: Payer: Self-pay | Admitting: Gastroenterology

## 2022-05-01 VITALS — BP 131/65 | HR 63 | Temp 98.9°F | Resp 16 | Ht 66.0 in | Wt 168.0 lb

## 2022-05-01 DIAGNOSIS — K635 Polyp of colon: Secondary | ICD-10-CM

## 2022-05-01 DIAGNOSIS — Z860101 Personal history of adenomatous and serrated colon polyps: Secondary | ICD-10-CM

## 2022-05-01 DIAGNOSIS — Z09 Encounter for follow-up examination after completed treatment for conditions other than malignant neoplasm: Secondary | ICD-10-CM | POA: Diagnosis not present

## 2022-05-01 DIAGNOSIS — D123 Benign neoplasm of transverse colon: Secondary | ICD-10-CM

## 2022-05-01 DIAGNOSIS — Z86010 Personal history of colonic polyps: Secondary | ICD-10-CM

## 2022-05-01 MED ORDER — SODIUM CHLORIDE 0.9 % IV SOLN: 500.0000 mL | INTRAVENOUS | Status: DC

## 2022-05-01 NOTE — Progress Notes (Signed)
Pt's states no medical or surgical changes since previsit or office visit. 

## 2022-05-01 NOTE — Progress Notes (Signed)
A and O x3. Report to RN. Tolerated MAC anesthesia well. 

## 2022-05-01 NOTE — Op Note (Signed)
[facility] Patient Name: Renee Watts Procedure Date: 05/01/2022 10:58 AM MRN: [id number] Endoscopist: Ladene Artist , MD Age: 71 Referring MD:  Date of Birth: [birth year] Gender: Female Account #: [id number] Procedure:                Colonoscopy Indications:              Surveillance: Personal history of adenomatous                            polyps on last colonoscopy 3 years ago Medicines:                Monitored Anesthesia Care Procedure:                Pre-Anesthesia Assessment:                           - Prior to the procedure, a History and Physical                            was performed, and patient medications and                            allergies were reviewed. The patient's tolerance of                            previous anesthesia was also reviewed. The risks                            and benefits of the procedure and the sedation                            options and risks were discussed with the patient.                            All questions were answered, and informed consent                            was obtained. Prior Anticoagulants: The patient has                            taken no previous anticoagulant or antiplatelet                            agents. ASA Grade Assessment: III - A patient with                            severe systemic disease. After reviewing the risks                            and benefits, the patient was deemed in                            satisfactory condition to undergo the procedure.  After obtaining informed consent, the colonoscope                            was passed under direct vision. Throughout the                            procedure, the patient's blood pressure, pulse, and                            oxygen saturations were monitored continuously. The                            Olympus [id number]-[id number]) Colonoscope was                            introduced through the  anus and advanced to the the                            cecum, identified by appendiceal orifice and                            ileocecal valve. The ileocecal valve, appendiceal                            orifice, and rectum were photographed. The quality                            of the bowel preparation was good. The colonoscopy                            was performed without difficulty. The patient                            tolerated the procedure well. Scope In: 11:04:45 AM Scope Out: 11:17:19 AM Scope Withdrawal Time: 0 hours 9 minutes 50 seconds  Total Procedure Duration: 0 hours 12 minutes 34 seconds  Findings:                 The perianal and digital rectal examinations were                            normal.                           A 4 mm polyp was found in the transverse colon. The                            polyp was sessile. The polyp was removed with a                            cold snare. Resection and retrieval were complete.                           Anal papilla(e) were hypertrophied.  External and internal hemorrhoids were found during                            retroflexion. The hemorrhoids were small and Grade                            I (internal hemorrhoids that do not prolapse).                           The exam was otherwise without abnormality on                            direct and retroflexion views. Complications:            No immediate complications. Estimated blood loss:                            None. Estimated Blood Loss:     Estimated blood loss: none. Impression:               - One 4 mm polyp in the transverse colon, removed                            with a cold snare. Resected and retrieved.                           - Anal papilla(e) were hypertrophied.                           - External and internal hemorrhoids.                           - The examination was otherwise normal on direct                             and retroflexion views. Recommendation:           - Repeat colonoscopy after studies are complete for                            surveillance based on pathology results.                           - Patient has a contact number available for                            emergencies. The signs and symptoms of potential                            delayed complications were discussed with the                            patient. Return to normal activities tomorrow.                            Written discharge instructions were provided to  the                            patient.                           - Resume previous diet.                           - Continue present medications.                           - Await pathology results. Ladene Artist, MD 05/01/2022 11:20:13 AM This report has been signed electronically.

## 2022-05-01 NOTE — Progress Notes (Signed)
See 04/18/2022 H&P, no changes

## 2022-05-01 NOTE — Patient Instructions (Signed)

## 2022-05-02 ENCOUNTER — Telehealth: Payer: Self-pay

## 2022-05-02 NOTE — Telephone Encounter (Signed)
  Follow up Call-     05/01/2022   10:41 AM  Call back number  Post procedure Call Back phone  # [phone]  Permission to leave phone message Yes     Patient questions:  Do you have a fever, pain , or abdominal swelling? No. Pain Score  0 *  Have you tolerated food without any problems? Yes.    Have you been able to return to your normal activities? Yes.    Do you have any questions about your discharge instructions: Diet   No. Medications  No. Follow up visit  No.  Do you have questions or concerns about your Care? No.  Actions: * If pain score is 4 or above: No action needed, pain <4.

## 2022-05-02 NOTE — Telephone Encounter (Signed)
Symptoms from Metformin should improve after a wk or two of taking the medication consistently.  You can take OTC plain Robitussin, Coricidin HBP, or Mucinex HBP.

## 2022-05-04 ENCOUNTER — Encounter: Payer: Self-pay | Admitting: Family Medicine

## 2022-05-16 ENCOUNTER — Encounter: Payer: Self-pay | Admitting: Gastroenterology

## 2022-05-21 ENCOUNTER — Telehealth: Payer: Self-pay | Admitting: Family Medicine

## 2022-05-21 NOTE — Telephone Encounter (Signed)
Pt is calling and metformin hcl er 1000 mg is covered by her insurance  [facility] [place], [place] Phone:  [phone]  Fax:  [phone]

## 2022-05-21 NOTE — Telephone Encounter (Signed)
Pt requesting a cheaper substitution for metFORMIN (GLUMETZA) 1000 MG (MOD) 24 hr tablet as it's $200 for a 90d supply

## 2022-05-21 NOTE — Telephone Encounter (Signed)
Noted  

## 2022-05-24 ENCOUNTER — Ambulatory Visit: Payer: HMO

## 2022-05-28 NOTE — Telephone Encounter (Signed)
Pt needs new rx sent to  [facility], [place] Phone:  [phone]  Fax:  [phone]

## 2022-05-29 NOTE — Telephone Encounter (Signed)
Pt said PA for metFORMIN (GLUMETZA) 1000 MG (MOD) 24 hr tablet the medication cost 200.00 for 90 day supply and pt would like to know if she can get new rx metformin hcl er 1000 mg is covered by her insurance

## 2022-05-31 ENCOUNTER — Telehealth: Payer: Self-pay | Admitting: Family Medicine

## 2022-05-31 NOTE — Telephone Encounter (Signed)
Pt requesting a prescription for  metformin hcl er 1000 mg  says the initial prescription PA was denied and she now needs this medication. Pt states she has been out of her medication for a while because of the PA denial

## 2022-06-07 ENCOUNTER — Ambulatory Visit (INDEPENDENT_AMBULATORY_CARE_PROVIDER_SITE_OTHER): Payer: HMO | Admitting: Family Medicine

## 2022-06-07 VITALS — BP 136/82 | HR 66 | Temp 98.7°F | Wt 167.6 lb

## 2022-06-07 DIAGNOSIS — Z23 Encounter for immunization: Secondary | ICD-10-CM | POA: Diagnosis not present

## 2022-06-07 DIAGNOSIS — E1149 Type 2 diabetes mellitus with other diabetic neurological complication: Secondary | ICD-10-CM | POA: Diagnosis not present

## 2022-06-07 DIAGNOSIS — F419 Anxiety disorder, unspecified: Secondary | ICD-10-CM | POA: Diagnosis not present

## 2022-06-07 MED ORDER — ALPRAZOLAM 0.25 MG PO TABS: 0.2500 mg | ORAL_TABLET | Freq: Two times a day (BID) | ORAL | 0 refills | Status: DC | PRN

## 2022-06-07 MED ORDER — METFORMIN HCL ER 750 MG PO TB24: 750.0000 mg | ORAL_TABLET | Freq: Every day | ORAL | 3 refills | Status: DC

## 2022-06-07 NOTE — Progress Notes (Signed)
Subjective:    Patient ID: Renee Watts, female    DOB: [birth year], 71 y.o.   MRN: [id number]  Chief Complaint  Patient presents with   Follow-up    DM.    HPI Patient was seen today for f/u.  Pt unable to pick up rx for Metformin ER as insurance would not cover med.  PA was denied.  Pt states she has been eating junk recently and has not been on any meds for DM since last OFV.  Pt endorses recent increased stress/anxiety  as her daughter was dx'd with colon cancer.  She is currently in the hospital outside of [place].  Pt is on the way there after this visit.  Pt inquires about med to calm her nerves.  Several yrs ago was on a med prn.  Past Medical History:  Diagnosis Date   Allergy    Benign paroxysmal positional vertigo 06/25/2013   dx with prior PCP, s/p labs, mri and vestibular rehab; nasal spray for etd seemed to help   Blood in stool 03/11/2012   Dependent edema    Bilateral   Depression    GERD (gastroesophageal reflux disease)    on meds   Hyperlipidemia    on meds   Low back pain    sciatica; sees orthopedic specialist ([facility])for this and shoulder tendinitis   Obesity, diabetes, and hypertension syndrome ([place])    on med   Osteoarthritis, knee    Bilateral, s/p 3 total knee replacement surgerues on right, last 2005. Dr. Tonita Cong   Pellegrini-Steida syndrome    Myositis ossificans   Seasonal allergies    Shingles 07/10/2012   LESIONS CLEARED BUT NOT TOLERATING ANY THING TIGHT AGAINST BODY--SHINGLES WERE AROSS BACK AND ABDOMEN    No Known Allergies  ROS General: Denies fever, chills, night sweats, changes in weight, changes in appetite HEENT: Denies headaches, ear pain, changes in vision, rhinorrhea, sore throat CV: Denies CP, palpitations, SOB, orthopnea Pulm: Denies SOB, cough, wheezing GI: Denies abdominal pain, nausea, vomiting, diarrhea, constipation GU: Denies dysuria, hematuria, frequency, vaginal discharge Msk: Denies muscle cramps, joint  pains Neuro: Denies weakness, numbness, tingling Skin: Denies rashes, bruising Psych: Denies depression, anxiety, hallucinations  +increased stress/anxiety     Objective:    Blood pressure 136/82, pulse 66, temperature 98.7 F (37.1 C), temperature source Oral, weight 167 lb 9.6 oz (76 kg), SpO2 97 %.  Gen. Pleasant, well-nourished, in no distress, normal affect   HEENT: [place]/AT, face symmetric, conjunctiva clear, no scleral icterus, PERRLA, EOMI, nares patent without drainage Lungs: no accessory muscle use, CTAB, no wheezes or rales Cardiovascular: RRR, no m/r/g, no peripheral edema Musculoskeletal: No deformities, no cyanosis or clubbing, normal tone Neuro:  A&Ox3, CN II-XII intact, normal gait Skin:  Warm, no lesions/ rash   Wt Readings from Last 3 Encounters:  06/07/22 167 lb 9.6 oz (76 kg)  05/01/22 168 lb (76.2 kg)  04/18/22 162 lb (73.5 kg)    Lab Results  Component Value Date   WBC 5.9 04/19/2022   HGB 12.7 04/19/2022   HCT 38.1 04/19/2022   PLT 176.0 04/19/2022   GLUCOSE 154 (H) 04/19/2022   CHOL 165 07/03/2021   TRIG 126.0 07/03/2021   HDL 39.40 07/03/2021   LDLDIRECT 137.8 10/12/2013   LDLCALC 101 (H) 07/03/2021   ALT 21 04/19/2022   AST 19 04/19/2022   NA 140 04/19/2022   K 4.0 04/19/2022   CL 102 04/19/2022   CREATININE 0.43 04/19/2022   BUN 10  04/19/2022   CO2 29 04/19/2022   TSH 0.86 04/19/2022   INR 0.94 01/05/2013   HGBA1C 7.1 (A) 02/15/2022   MICROALBUR 0.5 03/11/2014    Assessment/Plan:  Type II diabetes mellitus with neurological manifestations ([place]) -hgb A1C 7.1% on 02/15/22 -rx for metformin resent.  If unable to obtain will send in different med like glipizide or farxiga -foot exam at next OFV  - Plan: metFORMIN (GLUCOPHAGE-XR) 750 MG 24 hr tablet, Microalbumin/Creatinine Ratio, Urine  Anxiety -new problem -situational anxiety 2/2 pt's daughter being in hospital -self care -consider counseling -continue to monitor  - Plan:  ALPRAZolam (XANAX) 0.25 MG tablet  Need for influenza vaccination  - Plan: Flu Vaccine QUAD High Dose(Fluad)  F/u in 1-2 months, sooner if needed  Grier Mitts, MD

## 2022-06-08 NOTE — Telephone Encounter (Signed)
Patient had visit on 10/12, medication discussed.

## 2022-06-12 ENCOUNTER — Other Ambulatory Visit: Payer: Self-pay | Admitting: Family Medicine

## 2022-06-12 DIAGNOSIS — M545 Low back pain, unspecified: Secondary | ICD-10-CM

## 2022-06-15 ENCOUNTER — Telehealth: Payer: Self-pay

## 2022-06-15 NOTE — Telephone Encounter (Signed)
Attempting to call pt to schedule requested f/u appt for around 08/07/22.  If pt returns call, please schedule appt.

## 2022-06-19 NOTE — Telephone Encounter (Signed)
Appt scheduled for 08/07/22; notes added to the chart for statin evaluation.

## 2022-06-26 ENCOUNTER — Encounter: Payer: Self-pay | Admitting: Family Medicine

## 2022-08-07 ENCOUNTER — Encounter: Payer: Self-pay | Admitting: Pharmacist

## 2022-08-07 DIAGNOSIS — G720 Drug-induced myopathy: Secondary | ICD-10-CM

## 2022-08-07 NOTE — Progress Notes (Signed)
[facility])                                            [place] Team                                        Statin Quality Measure Assessment    08/07/2022  Renee Watts 1950/12/10 [id number]  Per review of chart and payor information, patient has a diagnosis of diabetes but is not currently filling a statin prescription.  This places patient into the SUPD (Statin Use In Patients with Diabetes) measure for CMS.    Patient has documented trials of pravastatin  with reported weakness, but no corresponding CPT codes that would exclude patient from SUPD measure.  She has an upcoming appointment with you on 08/08/22. Please assess for statin therapy with alternative statin and/or dosing or associate a statin exclusion code with the upcoming visit.  The 10-year ASCVD risk score (Arnett DK, et al., 2019) is: 25.5%   Values used to calculate the score:     Age: 71 years     Sex: Female     Is Non-Hispanic African American: Yes     Diabetic: Yes     Tobacco smoker: No     Systolic Blood Pressure: 546 mmHg     Is BP treated: Yes     HDL Cholesterol: 39.4 mg/dL     Total Cholesterol: 165 mg/dL 07/03/2021     Component Value Date/Time   CHOL 165 07/03/2021 0938   TRIG 126.0 07/03/2021 0938   HDL 39.40 07/03/2021 0938   CHOLHDL 4 07/03/2021 0938   VLDL 25.2 07/03/2021 0938   LDLCALC 101 (H) 07/03/2021 0938   LDLCALC 140 (H) 06/27/2020 1017   LDLDIRECT 137.8 10/12/2013 1016    Please consider ONE of the following recommendations:  Initiate high intensity statin Atorvastatin '40mg'$  once daily, #90, 3 refills   Rosuvastatin '20mg'$  once daily, #90, 3 refills    Initiate moderate intensity          statin with reduced frequency if prior          statin intolerance 1x weekly, #13, 3 refills   2x weekly, #26, 3 refills   3x weekly, #39, 3 refills    Code for past statin intolerance or  other exclusions (required  annually)   Provider Requirements:  Associate code during an office visit or telehealth encounter  Drug Induced Myopathy G72.0   Myopathy, unspecified G72.9   Myositis, unspecified M60.9   Rhabdomyolysis F68.12   Alcoholic fatty liver X51.7   Cirrhosis of liver K74.69   Prediabetes R73.03   PCOS E28.2   Toxic liver disease, unspecified K71.9   Adverse effect of antihyperlipidemic and antiarteriosclerotic drugs, initial encounter G01.7C9S    Plan: Route note to PCP prior to upcoming appointment.  Elayne Guerin, PharmD, [place] Clinical Pharmacist [phone]

## 2022-08-08 ENCOUNTER — Ambulatory Visit (INDEPENDENT_AMBULATORY_CARE_PROVIDER_SITE_OTHER): Payer: PPO | Admitting: Family Medicine

## 2022-08-08 ENCOUNTER — Encounter: Payer: Self-pay | Admitting: Family Medicine

## 2022-08-08 VITALS — BP 142/76 | HR 65 | Temp 98.8°F | Wt 160.8 lb

## 2022-08-08 DIAGNOSIS — M79671 Pain in right foot: Secondary | ICD-10-CM

## 2022-08-08 DIAGNOSIS — M79672 Pain in left foot: Secondary | ICD-10-CM

## 2022-08-08 DIAGNOSIS — F419 Anxiety disorder, unspecified: Secondary | ICD-10-CM

## 2022-08-08 DIAGNOSIS — R110 Nausea: Secondary | ICD-10-CM | POA: Diagnosis not present

## 2022-08-08 DIAGNOSIS — E1149 Type 2 diabetes mellitus with other diabetic neurological complication: Secondary | ICD-10-CM

## 2022-08-08 DIAGNOSIS — M79605 Pain in left leg: Secondary | ICD-10-CM

## 2022-08-08 DIAGNOSIS — R630 Anorexia: Secondary | ICD-10-CM

## 2022-08-08 DIAGNOSIS — F439 Reaction to severe stress, unspecified: Secondary | ICD-10-CM

## 2022-08-08 DIAGNOSIS — M79604 Pain in right leg: Secondary | ICD-10-CM

## 2022-08-08 DIAGNOSIS — I781 Nevus, non-neoplastic: Secondary | ICD-10-CM

## 2022-08-08 DIAGNOSIS — I10 Essential (primary) hypertension: Secondary | ICD-10-CM

## 2022-08-08 LAB — POCT GLYCOSYLATED HEMOGLOBIN (HGB A1C): Hemoglobin A1C: 6.5 % — AB (ref 4.0–5.6)

## 2022-08-08 MED ORDER — ALPRAZOLAM 0.25 MG PO TABS: 0.2500 mg | ORAL_TABLET | Freq: Two times a day (BID) | ORAL | 0 refills | Status: DC | PRN

## 2022-08-08 NOTE — Patient Instructions (Addendum)
Will place referral to Neurology and Gastroenterology when you are ready.  Just let the clinic know.  We will have you follow-up in the next few months, sooner if needed to monitor weight.  I am unable to pull up the pathology report from your colonoscopy in September.  I will try looking again if we are able to locate it we can mail it to you.

## 2022-08-08 NOTE — Progress Notes (Signed)
Subjective:    Patient ID: Renee Watts, female    DOB: Jan 29, 1951, 71 y.o.   MRN: [id number]  Chief Complaint  Patient presents with   Follow-up    HPI Patient is a 71 yo female with pmh sig for DM II, HLD, GERD, was seen today for f/u.  Pt states bs has been good.  Only eating 1 meal per day.  Will stop eating after a few bites due to nausea.  Denies diarrhea, constipation, GERD, vomiting, burning in stomach, abdominal pain.  Patient had colonoscopy 05/01/2022.  Pt has lost 8 lbs in 2 months.  Patient endorses continued nagging pain/tingling in bilateral LEs from knees down.  Denies knee pain, numbness, edema, LEs feeling cold.  States sensation 9 out of 10 pain/pressure when laying down, relieved by shaking legs or walking.  Gabapentin and muscle relaxer did not help the sensation.  States her sister who has diabetes also has similar problem.  Pt endorses increased stress due to several family members health including her husband, daughter, and her brother.  Pt trying to take care of everyone. Past Medical History:  Diagnosis Date   Allergy    Benign paroxysmal positional vertigo 06/25/2013   dx with prior PCP, s/p labs, mri and vestibular rehab; nasal spray for etd seemed to help   Blood in stool 03/11/2012   Dependent edema    Bilateral   Depression    GERD (gastroesophageal reflux disease)    on meds   Hyperlipidemia    on meds   Low back pain    sciatica; sees orthopedic specialist ([facility])for this and shoulder tendinitis   Obesity, diabetes, and hypertension syndrome ([place])    on med   Osteoarthritis, knee    Bilateral, s/p 3 total knee replacement surgerues on right, last 2005. Dr. Tonita Cong   Pellegrini-Steida syndrome    Myositis ossificans   Seasonal allergies    Shingles 07/10/2012   LESIONS CLEARED BUT NOT TOLERATING ANY THING TIGHT AGAINST BODY--SHINGLES WERE AROSS BACK AND ABDOMEN    No Known Allergies  ROS General: Denies fever, chills, night sweats  +changes in weight, changes in appetite HEENT: Denies headaches, ear pain, changes in vision, rhinorrhea, sore throat CV: Denies CP, palpitations, SOB, orthopnea Pulm: Denies SOB, cough, wheezing GI: Denies abdominal pain, nausea, vomiting, diarrhea, constipation +nausea GU: Denies dysuria, hematuria, frequency, vaginal discharge  Msk: Denies muscle cramps, joint pains  +b/l LE pain Neuro: Denies weakness, numbness, tingling Skin: Denies rashes, bruising Psych: Denies depression, anxiety, hallucinations     Objective:    Blood pressure (!) 142/76, pulse 65, temperature 98.8 F (37.1 C), temperature source Oral, weight 160 lb 12.8 oz (72.9 kg), SpO2 97 %.  Gen. Pleasant, well-nourished, in no distress, normal affect   HEENT: [place]/AT, face symmetric, conjunctiva clear, no scleral icterus, PERRLA, EOMI, nares patent without drainage Lungs: no accessory muscle use, CTAB, no wheezes or rales Cardiovascular: RRR, no peripheral edema Musculoskeletal: No deformities, no cyanosis or clubbing, normal tone Neuro:  A&Ox3, CN II-XII intact, ambulating with a cane Skin:  Warm, dry, intact.  Hyperpigmented discoloration of bilateral Les, spider veins.   Wt Readings from Last 3 Encounters:  08/08/22 160 lb 12.8 oz (72.9 kg)  06/07/22 167 lb 9.6 oz (76 kg)  05/01/22 168 lb (76.2 kg)    Lab Results  Component Value Date   WBC 5.9 04/19/2022   HGB 12.7 04/19/2022   HCT 38.1 04/19/2022   PLT 176.0 04/19/2022   GLUCOSE 154 (  H) 04/19/2022   CHOL 165 07/03/2021   TRIG 126.0 07/03/2021   HDL 39.40 07/03/2021   LDLDIRECT 137.8 10/12/2013   LDLCALC 101 (H) 07/03/2021   ALT 21 04/19/2022   AST 19 04/19/2022   NA 140 04/19/2022   K 4.0 04/19/2022   CL 102 04/19/2022   CREATININE 0.43 04/19/2022   BUN 10 04/19/2022   CO2 29 04/19/2022   TSH 0.86 04/19/2022   INR 0.94 01/05/2013   HGBA1C 7.1 (A) 02/15/2022   MICROALBUR 0.5 03/11/2014    Assessment/Plan:  Type II diabetes mellitus with  neurological manifestations ([place]) -Hgb A1c was 7.1 % on 02/15/22 -continue metformin XR -on ACE I  -pt to schedule eye exam -A1C this visit 6.5%  - Plan: POC HgB A1c  Essential hypertension -recheck bp -typically controlled. -has yet to take meds this am -continue current meds including Lasix, lisinopril 20 mg  Nausea -Encouraged to take small bites -Consider small meals throughout the day/snacks -Refer to GI.  When pt ready will place referral.  Bilateral pain of leg and foot -discussed possible causes.  Not described as claudication, but still consider.  Consider other neuropathy. -Discussed EMG/NCS -Continue supportive care -will place referral to vein specialist and Neurology when pt ready.  Spider veins -discussed removal options -will place referral to vein and vascular when pt ready.  Decreased appetite -possibly 2/2 stress -discussed f/u with GI.  Pt wishes to wait at this time until things calm down with her schedule.  Will notify clinic when ready to schedule.  Stress -self care  Anxiety  - Plan: ALPRAZolam (XANAX) 0.25 MG tablet  F/u 1-2 months, sooner if needed  Grier Mitts, MD

## 2022-08-22 ENCOUNTER — Other Ambulatory Visit: Payer: Self-pay | Admitting: Family Medicine

## 2022-08-22 DIAGNOSIS — Z1231 Encounter for screening mammogram for malignant neoplasm of breast: Secondary | ICD-10-CM

## 2022-09-17 ENCOUNTER — Other Ambulatory Visit: Payer: Self-pay | Admitting: Family Medicine

## 2022-09-17 DIAGNOSIS — M545 Low back pain, unspecified: Secondary | ICD-10-CM

## 2022-09-26 ENCOUNTER — Other Ambulatory Visit: Payer: Self-pay

## 2022-09-26 MED ORDER — LISINOPRIL 20 MG PO TABS: 20.0000 mg | ORAL_TABLET | Freq: Every day | ORAL | 1 refills | Status: DC

## 2022-10-03 ENCOUNTER — Ambulatory Visit (INDEPENDENT_AMBULATORY_CARE_PROVIDER_SITE_OTHER): Payer: PPO | Admitting: Orthopaedic Surgery

## 2022-10-03 ENCOUNTER — Encounter: Payer: Self-pay | Admitting: Orthopaedic Surgery

## 2022-10-03 ENCOUNTER — Ambulatory Visit (INDEPENDENT_AMBULATORY_CARE_PROVIDER_SITE_OTHER): Payer: PPO

## 2022-10-03 DIAGNOSIS — M545 Low back pain, unspecified: Secondary | ICD-10-CM

## 2022-10-03 DIAGNOSIS — M25571 Pain in right ankle and joints of right foot: Secondary | ICD-10-CM | POA: Diagnosis not present

## 2022-10-03 DIAGNOSIS — G8929 Other chronic pain: Secondary | ICD-10-CM | POA: Diagnosis not present

## 2022-10-03 MED ORDER — LIDOCAINE HCL 1 % IJ SOLN
0.5000 mL | INTRAMUSCULAR | Status: AC | PRN
  Administered 2022-10-03: .5 mL

## 2022-10-03 MED ORDER — METHYLPREDNISOLONE ACETATE 40 MG/ML IJ SUSP
20.0000 mg | INTRAMUSCULAR | Status: AC | PRN
  Administered 2022-10-03: 20 mg via INTRA_ARTICULAR

## 2022-10-03 NOTE — Progress Notes (Signed)
I have seen and examined the patient and discussed her case with Benita Stabile, PA-C.  I agree with his note with his assessment and plan.

## 2022-10-03 NOTE — Progress Notes (Signed)
Office Visit Note   Patient: Renee Watts           Date of Birth: 01-18-1951           MRN: [id number] Visit Date: 10/03/2022              Requested by: Billie Ruddy, MD [place],  [place] [address] PCP: Billie Ruddy, MD   Assessment & Plan: Visit Diagnoses:  1. Pain in right ankle and joints of right foot   2. Chronic low back pain without sciatica, unspecified back pain laterality     Plan: Patient tolerated the right sinus Tarsi injection well today.  She had diminished pain with ambulation after the injection in regards to the right ankle.  Will send her to formal physical therapy for back exercises, core strengthening, hamstring, gastroc soleus stretching, modalities and home exercise program.  Will see her back in 6 weeks to see what type of results she had.  Questions were encouraged and answered by Dr. Ninfa Linden and myself.  Follow-Up Instructions: Return in about 6 weeks (around 11/14/2022).   Orders:  Orders Placed This Encounter  Procedures   Foot Inj   XR Ankle Complete Right   No orders of the defined types were placed in this encounter.     Procedures: Small Joint Inj: R intertarsal on 10/03/2022 11:56 AM Details: 25 G needle, dorsal approach  Spinal Needle: No  Medications: 0.5 mL lidocaine 1 %; 20 mg methylPREDNISolone acetate 40 MG/ML Consent was given by the patient. Immediately prior to procedure a time out was called to verify the correct patient, procedure, equipment, support staff and site/side marked as required. Patient was prepped and draped in the usual sterile fashion.       Clinical Data: No additional findings.   Subjective: Chief Complaint  Patient presents with   Right Ankle - Pain   Lower Back - Pain    HPI HPI: Renee Watts comes in today due to right ankle pain and low back pain.  She has been seen in the past for low back pain and had injections by Dr. Ernestina Patches at that time she was having pain down  into her right leg.  She is now having just low back pain without radiating radicular symptoms.  She has known anterior listhesis at L3 and L4 .  She states the back pain is similar to what she has had in the past is no pain down the right leg. Regards to the right ankle pain.  She has a history of a nondisplaced right lateral malleolus fracture which was treated nonoperatively in the past.  She has been having pain in the ankle anterior aspect for couple of months but no injury.  She is notes some swelling about the ankle at times.  No new injury. She is diabetic but reports good control of her diabetes with a hemoglobin A1c of 6.5. Review of Systems No fevers chills.  Objective: Vital Signs: There were no vitals taken for this visit.  Physical Exam Constitutional:      Appearance: She is not ill-appearing or diaphoretic.  Pulmonary:     Effort: Pulmonary effort is normal.  Neurological:     Mental Status: She is alert and oriented to person, place, and time.  Psychiatric:        Mood and Affect: Mood normal.        Behavior: Behavior normal.     Ortho Exam Lower extremities: 5 out  of 5 strength throughout the lower extremities except for right hip flexion which is 4 out of 5.  She has diminished dorsiflexion of both feet but is able to dorsiflex the feet slightly against gravity when placed in a plantarflexion position.  She has full extension of the toes bilaterally.  Sensation grossly intact bilateral feet.  Negative straight leg raise on the left positive on the right.  Good range of motion bilateral hips without pain.  Exquisitely tight gastrocs bilaterally.  Dorsal pedal pulses are 2+ bilaterally equal symmetric.  Bilateral ankles nontender over the medial lateral malleoli.  Right ankle tenderness over the sinus Tarsi region.  No tenderness over the sinus Tarsi region of the left.  Nontender over the posterior tibial tendons peroneal tendons bilaterally.  Nontender over the Achilles  bilaterally.  Achilles are intact bilaterally Specialty Comments:  MRI LUMBAR SPINE WITHOUT CONTRAST     TECHNIQUE:  Multiplanar, multisequence MR imaging of the lumbar spine was  performed. No intravenous contrast was administered.     COMPARISON:  Prior radiograph from 09/08/2019.     FINDINGS:  Segmentation: Standard. Lowest well-formed disc space labeled the  L5-S1 level.     Alignment: 4 mm anterolisthesis of L3 on L4, chronic and facet  mediated. Alignment otherwise normal with preservation of the normal  lumbar lordosis.     Vertebrae: Vertebral body height maintained without acute or chronic  fracture. Bone marrow signal intensity within normal limits. No  worrisome osseous lesions. No abnormal marrow edema.     Conus medullaris and cauda equina: Conus extends to the L1 level.  Conus and cauda equina appear normal.     Paraspinal and other soft tissues: Paraspinous soft tissues within  normal limits. Visualized visceral structures unremarkable.     Disc levels:     T10-11: Mild disc bulge with disc desiccation. Mild facet  hypertrophy. No significant stenosis.     T11-12: Mild disc bulge with disc desiccation. No significant  stenosis.     T12-L1: Negative interspace.  Mild facet hypertrophy.  No stenosis.     L1-2: Negative interspace. Mild facet hypertrophy. No significant  stenosis.     L2-3: Mild disc bulge with disc desiccation. Associated minimal  reactive endplate change. Mild facet and ligament flavum  hypertrophy. No significant spinal stenosis. Foramina remain patent.     L3-4: 4 mm anterolisthesis. Diffuse disc bulge with disc desiccation  and intervertebral disc space narrowing. Associated broad-based  central disc protrusion with slight superior migration of disc  material. Moderate to advanced bilateral facet hypertrophy, slightly  worse on the left. Resultant moderate to severe canal with left  worse than right lateral recess stenosis. Moderate  left with mild  right L3 foraminal narrowing.     L4-5: Degenerative intervertebral disc space narrowing with disc  desiccation and diffuse disc bulge. Superimposed central disc  protrusion with mild superior migration. Moderate left with mild  right facet hypertrophy. Resultant mild to moderate narrowing of the  lateral recesses, left slightly worse than right. Mild left L4  foraminal stenosis. No significant right foraminal encroachment.     L5-S1: Degenerative intervertebral disc space narrowing with disc  desiccation and diffuse disc bulge. Superimposed shallow right  subarticular disc protrusion with slight superior migration (series  6, image 38). Protruding disc closely approximates the descending  right S1 nerve root without frank impingement or displacement. Mild  left greater than right facet hypertrophy. No significant spinal  stenosis. Mild bilateral L5 foraminal stenosis, slightly worse  on  the right.     IMPRESSION:  1. 4 mm anterolisthesis of L3 on L4 with associated disc bulge and  facet hypertrophy, resulting in moderate to severe canal with left  worse than right lateral recess stenosis, with moderate left L3  foraminal narrowing.  2. Shallow central disc protrusion and facet hypertrophy at L4-5  with resultant mild to moderate left greater than right lateral  recess stenosis.  3. Shallow right subarticular disc protrusion at L5-S1, closely  approximating and potentially irritating the descending right S1  nerve root.        Electronically Signed    By: Jeannine Boga M.D.    On: 06/27/2020 03:34  Imaging: XR Ankle Complete Right  Result Date: 10/03/2022 Right ankle 3 views: No acute fracture.  Talus well located within the ankle mortise no diastases.  No significant arthritic changes    PMFS History: Patient Active Problem List   Diagnosis Date Noted   Osteopenia 02/15/2021   Venous stasis of both lower extremities 06/27/2020   Bilateral lower  extremity edema 02/05/2020   Recurrent sinusitis 09/03/2018   Vaginal vault prolapse after hysterectomy 02/13/2017   Obesity 01/06/2016   Low back pain 01/06/2016   Diabetic peripheral neuropathy ([place]) 09/01/2012   Hyperlipemia 09/16/2006   Essential hypertension 09/16/2006   GERD 09/16/2006   Type II diabetes mellitus with neurological manifestations ([place]) 06/17/2006   Osteoarthritis 06/17/2006   DEPENDENT EDEMA, LEGS, BILATERAL 06/17/2006   Past Medical History:  Diagnosis Date   Allergy    Benign paroxysmal positional vertigo 06/25/2013   dx with prior PCP, s/p labs, mri and vestibular rehab; nasal spray for etd seemed to help   Blood in stool 03/11/2012   Dependent edema    Bilateral   Depression    GERD (gastroesophageal reflux disease)    on meds   Hyperlipidemia    on meds   Low back pain    sciatica; sees orthopedic specialist ([facility])for this and shoulder tendinitis   Obesity, diabetes, and hypertension syndrome ([place])    on med   Osteoarthritis, knee    Bilateral, s/p 3 total knee replacement surgerues on right, last 2005. Dr. Tonita Cong   Pellegrini-Steida syndrome    Myositis ossificans   Seasonal allergies    Shingles 07/10/2012   LESIONS CLEARED BUT NOT TOLERATING ANY THING TIGHT AGAINST BODY--SHINGLES WERE AROSS BACK AND ABDOMEN    Family History  Problem Relation Age of Onset   Diabetes Mother    Hypertension Mother    Hypertension Father    Cancer Father        Lung CA   Coronary artery disease Father    Colon cancer Neg Hx    Colon polyps Neg Hx    Esophageal cancer Neg Hx    Rectal cancer Neg Hx    Stomach cancer Neg Hx     Past Surgical History:  Procedure Laterality Date   ABDOMINAL HYSTERECTOMY  1984   BLADDER SURGERY  03/2017   bladder tac per pt   COLONOSCOPY  2020   MS-MAC-goly (good)-hems/TAx5)   left shoulder steroid injection     POLYPECTOMY  2020   TA x 5   TONSILLECTOMY  1977   TOTAL HIP ARTHROPLASTY Right 10/10/2012    Procedure: R TOTAL HIP ARTHROPLASTY ANTERIOR APPROACH;  Surgeon: Mcarthur Rossetti, MD;  Location: [facility];  Service: Orthopedics;  Laterality: Right;  Right Total Hip Arthroplasty, Anterior Approach (C-Arm)   TOTAL KNEE ARTHROPLASTY Right  with multiple revisions, 2003, 2005   TOTAL KNEE ARTHROPLASTY Left 01/09/2013   Procedure: LEFT TOTAL KNEE ARTHROPLASTY;  Surgeon: Mcarthur Rossetti, MD;  Location: [facility];  Service: Orthopedics;  Laterality: Left;   TUBAL LIGATION  1978   WISDOM TOOTH EXTRACTION  2013,2014   Social History   Occupational History    Employer: [employer]  Tobacco Use   Smoking status: Never   Smokeless tobacco: Never  Vaping Use   Vaping Use: Never used  Substance and Sexual Activity   Alcohol use: No   Drug use: No   Sexual activity: Not on file

## 2022-10-03 NOTE — Addendum Note (Signed)
Addended by: Robyne Peers on: 10/03/2022 01:46 PM   Modules accepted: Orders

## 2022-10-12 ENCOUNTER — Ambulatory Visit
Admission: RE | Admit: 2022-10-12 | Discharge: 2022-10-12 | Disposition: A | Discharge status: Home / Self Care | Payer: PPO | Source: Ambulatory Visit | Attending: Family Medicine | Admitting: Family Medicine

## 2022-10-12 DIAGNOSIS — Z1231 Encounter for screening mammogram for malignant neoplasm of breast: Secondary | ICD-10-CM

## 2022-10-17 ENCOUNTER — Ambulatory Visit: Payer: PPO

## 2022-10-29 ENCOUNTER — Telehealth: Payer: Self-pay | Admitting: Physical Medicine and Rehabilitation

## 2022-10-29 NOTE — Telephone Encounter (Signed)
Patient called. She would like an appointment with Dr. Ernestina Patches.

## 2022-10-30 NOTE — Telephone Encounter (Signed)
Attempted to call patient, unable to leave voicemail.

## 2022-11-06 ENCOUNTER — Telehealth: Payer: Self-pay | Admitting: Pharmacist

## 2022-11-06 DIAGNOSIS — G720 Drug-induced myopathy: Secondary | ICD-10-CM

## 2022-11-06 NOTE — Progress Notes (Signed)
[facility]) [place] Team Statin Quality Measure Assessment  11/06/2022  JAZARIYAH GRIBBINS 06/02/1951 [id number]  Per review of chart and payor information, patient has a diagnosis of diabetes but is not currently filling a statin prescription.  This places patient into the Statin Use In Patients with Diabetes (SUPD) measure for CMS.    Patient has documented allergy to statin but no corresponding CPT codes that would exclude patient from SUPD measure.  She has an upcoming appointment 11/07/22.  If deemed therapeutically appropriate, a statin exclusion code could be associated with the upcoming visit which will remove the patient from the measure. She was previously on pravastatin but could also be trialed on another statin with alternative dosing.    The 10-year ASCVD risk score (Arnett DK, et al., 2019) is: 27.2%   Values used to calculate the score:     Age: 72 years     Sex: Female     Is Non-Hispanic African American: Yes     Diabetic: Yes     Tobacco smoker: No     Systolic Blood Pressure: [id number] mmHg     Is BP treated: Yes     HDL Cholesterol: 39.4 mg/dL     Total Cholesterol: 165 mg/dL 07/03/2021     Component Value Date/Time   CHOL 165 07/03/2021 0938   TRIG 126.0 07/03/2021 0938   HDL 39.40 07/03/2021 0938   CHOLHDL 4 07/03/2021 0938   VLDL 25.2 07/03/2021 0938   LDLCALC 101 (H) 07/03/2021 0938   LDLCALC 140 (H) 06/27/2020 1017   LDLDIRECT 137.8 10/12/2013 1016    Please consider ONE of the following recommendations:  Initiate high intensity statin Atorvastatin 40 mg once daily, #90, 3 refills   Rosuvastatin 20 mg once daily, #90, 3 refills    Initiate moderate intensity          statin with reduced frequency if prior          statin intolerance 1x weekly, #13, 3 refills   2x weekly, #26, 3 refills   3x weekly, #39, 3 refills    Code for past statin intolerance or  other exclusions (required annually)  Provider  Requirements: Associate code during an office visit or telehealth encounter  Drug Induced Myopathy G72.0   Myopathy, unspecified G72.9   Myositis, unspecified M60.9   Rhabdomyolysis M62.82   Cirrhosis of liver K74.69   Prediabetes R73.03   PCOS E28.2    Plan: Route note to Provider prior to upcoming appointment.   Elayne Guerin, PharmD, [place] Clinical Pharmacist [phone]

## 2022-11-07 ENCOUNTER — Ambulatory Visit (INDEPENDENT_AMBULATORY_CARE_PROVIDER_SITE_OTHER): Payer: PPO | Admitting: Family Medicine

## 2022-11-07 ENCOUNTER — Encounter: Payer: Self-pay | Admitting: Family Medicine

## 2022-11-07 VITALS — BP 158/77 | HR 64 | Temp 98.8°F | Wt 164.4 lb

## 2022-11-07 DIAGNOSIS — R5383 Other fatigue: Secondary | ICD-10-CM

## 2022-11-07 DIAGNOSIS — G8929 Other chronic pain: Secondary | ICD-10-CM

## 2022-11-07 DIAGNOSIS — M545 Low back pain, unspecified: Secondary | ICD-10-CM

## 2022-11-07 DIAGNOSIS — E785 Hyperlipidemia, unspecified: Secondary | ICD-10-CM

## 2022-11-07 DIAGNOSIS — I10 Essential (primary) hypertension: Secondary | ICD-10-CM

## 2022-11-07 DIAGNOSIS — Z029 Encounter for administrative examinations, unspecified: Secondary | ICD-10-CM

## 2022-11-07 DIAGNOSIS — E1149 Type 2 diabetes mellitus with other diabetic neurological complication: Secondary | ICD-10-CM

## 2022-11-07 DIAGNOSIS — R1031 Right lower quadrant pain: Secondary | ICD-10-CM

## 2022-11-07 LAB — CBC WITH DIFFERENTIAL/PLATELET
Basophils Absolute: 0 10*3/uL (ref 0.0–0.1)
Basophils Relative: 0.8 % (ref 0.0–3.0)
Eosinophils Absolute: 0.3 10*3/uL (ref 0.0–0.7)
Eosinophils Relative: 5.9 % — ABNORMAL HIGH (ref 0.0–5.0)
HCT: 36.4 % (ref 36.0–46.0)
Hemoglobin: 12.4 g/dL (ref 12.0–15.0)
Lymphocytes Relative: 35 % (ref 12.0–46.0)
Lymphs Abs: 1.9 10*3/uL (ref 0.7–4.0)
MCHC: 34.2 g/dL (ref 30.0–36.0)
MCV: 92.9 fl (ref 78.0–100.0)
Monocytes Absolute: 0.5 10*3/uL (ref 0.1–1.0)
Monocytes Relative: 9.3 % (ref 3.0–12.0)
Neutro Abs: 2.7 10*3/uL (ref 1.4–7.7)
Neutrophils Relative %: 49 % (ref 43.0–77.0)
Platelets: 166 10*3/uL (ref 150.0–400.0)
RBC: 3.92 Mil/uL (ref 3.87–5.11)
RDW: 13.6 % (ref 11.5–15.5)
WBC: 5.5 10*3/uL (ref 4.0–10.5)

## 2022-11-07 LAB — BASIC METABOLIC PANEL
BUN: 8 mg/dL (ref 6–23)
CO2: 27 mEq/L (ref 19–32)
Calcium: 9.3 mg/dL (ref 8.4–10.5)
Chloride: 106 mEq/L (ref 96–112)
Creatinine, Ser: 0.38 mg/dL — ABNORMAL LOW (ref 0.40–1.20)
GFR: 99.94 mL/min (ref >60.00)
Glucose, Bld: 143 mg/dL — ABNORMAL HIGH (ref 70–99)
Potassium: 4 mEq/L (ref 3.5–5.1)
Sodium: 140 mEq/L (ref 135–145)

## 2022-11-07 LAB — LIPID PANEL
Cholesterol: 194 mg/dL (ref 0–200)
HDL: 43 mg/dL (ref >39.00)
LDL Cholesterol: 122 mg/dL — ABNORMAL HIGH (ref 0–99)
NonHDL: 151.23
Total CHOL/HDL Ratio: 5
Triglycerides: 146 mg/dL (ref 0.0–149.0)
VLDL: 29.2 mg/dL (ref 0.0–40.0)

## 2022-11-07 LAB — MICROALBUMIN / CREATININE URINE RATIO
Creatinine,U: 47.8 mg/dL
Microalb Creat Ratio: 92.1 mg/g — ABNORMAL HIGH (ref 0.0–30.0)
Microalb, Ur: 44 mg/dL — ABNORMAL HIGH (ref 0.0–1.9)

## 2022-11-07 LAB — VITAMIN B12: Vitamin B-12: >1500 pg/mL — ABNORMAL HIGH (ref 211–911)

## 2022-11-07 LAB — POCT GLYCOSYLATED HEMOGLOBIN (HGB A1C): Hemoglobin A1C: 6.5 % — AB (ref 4.0–5.6)

## 2022-11-07 LAB — VITAMIN D 25 HYDROXY (VIT D DEFICIENCY, FRACTURES): VITD: 19.38 ng/mL — ABNORMAL LOW (ref 30.00–100.00)

## 2022-11-07 MED ORDER — TRAMADOL HCL 50 MG PO TABS: ORAL_TABLET | ORAL | 1 refills | Status: DC

## 2022-11-07 NOTE — Progress Notes (Signed)
Established Patient Office Visit   Subjective  Patient ID: Renee Watts, female    DOB: 1951/07/20  Age: 72 y.o. MRN: [id number]  Chief Complaint  Patient presents with   Medical Management of Chronic Issues   Form Completion   Fatigue    Patient is a 72 year old female with  has a past medical history of Allergy, Benign paroxysmal positional vertigo, Dependent edema, Depression, GERD, hyperlipidemia, neck low back pain, Obesity, DM2 , HTN osteoarthritis, knee, Pellegrini-Steida syndrome, Seasonal allergies, and Shingles who presents for paperwork and follow-up.  Patient has handicap placard form.  Patient states tramadol helps take the edge off of right groin and back pain.  Will use 1/day if needed.  Patient contacted Dr. Romona Curls office in regards to scheduling back injection.  Awaiting response.  Patient advised of right thigh/groin pain due to arthritis.  OTC topical medications do not provide relief.  Patient started using a leg exercise machine which seems to be helping balance and strengthening of the lower leg.  Patient plans to start water aerobics mid back pain controlled with injection.  Denies hyperglycemia.  Patient notes fatigue.  Unable to get comfortable at night 2/2 back pain.  Endorses tossing and turning .  Can fall asleep during the day.  Patient has a great granddaughter who is due in June.       ROS Negative unless stated above    Objective:     BP (!) 158/77 (BP Location: Left Arm, Patient Position: Sitting, Cuff Size: Normal)   Pulse 64   Temp 98.8 F (37.1 C) (Oral)   Wt 164 lb 6.4 oz (74.6 kg)   SpO2 95%   BMI 26.53 kg/m    Physical Exam Constitutional:      General: She is not in acute distress.    Appearance: Normal appearance.  HENT:     Head: Normocephalic and atraumatic.     Nose: Nose normal.     Mouth/Throat:     Mouth: Mucous membranes are moist.  Cardiovascular:     Rate and Rhythm: Normal rate and regular rhythm.     Heart  sounds: Normal heart sounds. No murmur heard.    No gallop.  Pulmonary:     Effort: Pulmonary effort is normal. No respiratory distress.     Breath sounds: Normal breath sounds. No wheezing, rhonchi or rales.  Musculoskeletal:     Cervical back: Normal.     Thoracic back: Normal.     Lumbar back: Tenderness present.     Comments: No TTP of lateral thigh or hip.  TTP of lumbar spine and paraspinal muscles.  Skin:    General: Skin is warm and dry.  Neurological:     Mental Status: She is alert and oriented to person, place, and time.     Motor: Weakness present.     Comments: Ambulating with cane.      Results for orders placed or performed in visit on 11/07/22  POC HgB A1c  Result Value Ref Range   Hemoglobin A1C 6.5 (A) 4.0 - 5.6 %   HbA1c POC (<> result, manual entry)     HbA1c, POC (prediabetic range)     HbA1c, POC (controlled diabetic range)        Assessment & Plan:  Type II diabetes mellitus with neurological manifestations ([facility]) -Controlled -Hemoglobin A1c 6.5% this visit.  Previously the same 08/08/2022. -Continue current medications including metformin XR 750 mg every morning -Continue ACEI -eye exam done 01/30/22 -Obtain  labs including vitamin B12 due to metformin use -foot exam due -     POCT glycosylated hemoglobin (Hb A1C) -     Microalbumin / creatinine urine ratio -     Lipid panel -     Vitamin B12  Hyperlipidemia, unspecified hyperlipidemia type -     Lipid panel  Fatigue, unspecified type -likely 2/2 broken sleep due to chronic back pain. -     Vitamin B12 -     CBC with Differential/Platelet -     VITAMIN D 25 Hydroxy (Vit-D Deficiency, Fractures) -     Basic metabolic panel  Chronic bilateral low back pain without sciatica -continue f/u with Ortho -advised to reach back out to Dr. Romona Curls office regarding scheduling of back injection -Encouraged to start light aerobic -     traMADol HCl; TAKE 1 TABLET(50 MG) BY MOUTH EVERY 8 HOURS AS NEEDED  FOR SEVERE PAIN  Dispense: 90 tablet; Refill: 1 -     CBC with Differential/Platelet  Right groin pain -s/p R hip replacement -current symptoms thought 2/2 arthritis -water aerobics and chair exercises encouraged to strengthen LEs -Consider PT -     CBC with Differential/Platelet  Administrative encounter -Handicap placard form completed  Essential hypertension -Elevated -Likely 2/2 pain -Recheck -Continue current medications including lisinopril 20 mg daily, Lasix 20 mg -For continued elevation adjust medication -     Basic metabolic panel   Return in about 5 months (around 04/09/2023), or if symptoms worsen or fail to improve.   Billie Ruddy, MD

## 2022-11-14 ENCOUNTER — Ambulatory Visit (INDEPENDENT_AMBULATORY_CARE_PROVIDER_SITE_OTHER): Payer: PPO | Admitting: Orthopaedic Surgery

## 2022-11-14 ENCOUNTER — Other Ambulatory Visit: Payer: Self-pay

## 2022-11-14 ENCOUNTER — Other Ambulatory Visit: Payer: Self-pay | Admitting: Family Medicine

## 2022-11-14 ENCOUNTER — Encounter: Payer: Self-pay | Admitting: Orthopaedic Surgery

## 2022-11-14 ENCOUNTER — Telehealth: Payer: Self-pay

## 2022-11-14 DIAGNOSIS — M5416 Radiculopathy, lumbar region: Secondary | ICD-10-CM

## 2022-11-14 DIAGNOSIS — M545 Low back pain, unspecified: Secondary | ICD-10-CM | POA: Diagnosis not present

## 2022-11-14 DIAGNOSIS — G8929 Other chronic pain: Secondary | ICD-10-CM | POA: Diagnosis not present

## 2022-11-14 DIAGNOSIS — E559 Vitamin D deficiency, unspecified: Secondary | ICD-10-CM

## 2022-11-14 MED ORDER — VITAMIN D (ERGOCALCIFEROL) 1.25 MG (50000 UNIT) PO CAPS: 50000.0000 [IU] | ORAL_CAPSULE | ORAL | 0 refills | Status: DC

## 2022-11-14 NOTE — Telephone Encounter (Signed)
Megan asked if you want to just have her get the MRI before scheduling the ESI? We sent them an order for [facility] [place] today

## 2022-11-14 NOTE — Progress Notes (Signed)
HPI: Mrs. Mittleman returns today for follow-up of her right ankle sinus Tarsi injections.  She states that that really helped with her ankle pain.  However she continues to have low back pain.  States her pain is getting worse than her back.  She is having to ambulate with a cane because of the pain.  Pain is worse with standing.  Denies any radicular symptoms down either leg.  She states that the back pain now is affecting her activities of daily living.  She is wearing a back brace.  Ranks her pain to be 10 out of 10 pain.  She has trouble sleeping but does not awaken due to the back pain.  Denies any fevers, chills, acute injuries to the back, saddle anesthesia like symptoms or bowel or bladder dysfunction.  She states she was unable to go to therapy for back.   Physical exam: General well-developed well-nourished female in no acute distress.  Ambulates with antalgic gait with the use of a cane. Psych: Alert and oriented x 3 Lower extremities.  She has 5 out of 5 strength throughout except for the right hip which she has 4-5 with flexion, great toe extension 3 out of 5 bilaterally, left ankle 3 out of 5 strength against resistance with dorsiflexion.  And virtually no dorsiflexion of the right ankle.  Negative straight leg raise.  Tight gastrocs bilaterally.   Impression: Low back pain without radicular symptoms  Plan: Given her weakness in the lower extremities recommend repeat MRI of the lumbar spine.  Last lumbar MRI she had was 06/27/2020.  This showed 4 mm anterior listhesis L3 on 4 with disc bulge and facet hypertrophy resulting in moderate to severe canal stenosis.  Also left L3 moderate foraminal narrowing.  L4-5 disc protrusion central mild to moderate left greater than right stenosis.  L5-S1 right subarticular disc protrusion potentially irritating the S1 nerve root. She sustained a right ankle fracture 7/22.  There is documented decreased dorsiflexion both ankles as she gets to go to therapy  for her right ankle fracture.  However in spite of this given her significant back pain and weakness in both lower extremities recommend repeat MRI have her follow-up after the MRI to go over results and discuss further treatment.  MRI is to evaluate for advancing stenosis and or new HNP.  Questions were encouraged and answered she will follow-up with Dr. Ninfa Linden 3 to 4 days after the MRI to go over results and discuss further treatment.

## 2022-11-15 ENCOUNTER — Other Ambulatory Visit: Payer: Self-pay

## 2022-11-15 ENCOUNTER — Telehealth: Payer: Self-pay | Admitting: Orthopaedic Surgery

## 2022-11-15 MED ORDER — DIAZEPAM 5 MG PO TABS: ORAL_TABLET | ORAL | 0 refills | Status: DC

## 2022-11-15 NOTE — Telephone Encounter (Signed)
Called in.

## 2022-11-15 NOTE — Telephone Encounter (Signed)
Noted  

## 2022-11-15 NOTE — Telephone Encounter (Signed)
Pt called stating her MRI is scheduled for Monday  3/25. Need medication to keep calm for MRI. Please send valium to [facility]. Pt phone number is [phone].

## 2022-11-15 NOTE — Addendum Note (Signed)
Addended by: Nilda Riggs on: 11/15/2022 09:42 AM   Modules accepted: Orders

## 2022-11-19 DIAGNOSIS — M545 Low back pain, unspecified: Secondary | ICD-10-CM | POA: Diagnosis not present

## 2022-11-22 ENCOUNTER — Other Ambulatory Visit: Payer: PPO

## 2022-11-22 DIAGNOSIS — E1149 Type 2 diabetes mellitus with other diabetic neurological complication: Secondary | ICD-10-CM

## 2022-11-22 LAB — MICROALBUMIN / CREATININE URINE RATIO
Creatinine,U: 46.4 mg/dL
Microalb Creat Ratio: 44.2 mg/g — ABNORMAL HIGH (ref 0.0–30.0)
Microalb, Ur: 20.5 mg/dL — ABNORMAL HIGH (ref 0.0–1.9)

## 2022-11-26 ENCOUNTER — Telehealth: Payer: Self-pay | Admitting: Family Medicine

## 2022-11-26 NOTE — Telephone Encounter (Signed)
Pt call and stated she need a call back with the result from her lab test.

## 2022-11-28 NOTE — Telephone Encounter (Signed)
Patient called with results and she verbalized understanding

## 2022-12-06 ENCOUNTER — Encounter: Payer: Self-pay | Admitting: Orthopaedic Surgery

## 2022-12-06 ENCOUNTER — Ambulatory Visit (INDEPENDENT_AMBULATORY_CARE_PROVIDER_SITE_OTHER): Payer: PPO | Admitting: Orthopaedic Surgery

## 2022-12-06 ENCOUNTER — Other Ambulatory Visit: Payer: Self-pay

## 2022-12-06 DIAGNOSIS — M545 Low back pain, unspecified: Secondary | ICD-10-CM

## 2022-12-06 DIAGNOSIS — G8929 Other chronic pain: Secondary | ICD-10-CM | POA: Diagnosis not present

## 2022-12-06 DIAGNOSIS — M5416 Radiculopathy, lumbar region: Secondary | ICD-10-CM

## 2022-12-06 MED ORDER — CELECOXIB 200 MG PO CAPS: 200.0000 mg | ORAL_CAPSULE | Freq: Two times a day (BID) | ORAL | 1 refills | Status: DC | PRN

## 2022-12-06 NOTE — Progress Notes (Signed)
The patient is a 72 year old female well-known to me.  She comes in today to go over an MRI of her lumbar spine.  I was able to find the MRI under [facility]. [place] on the canopy system.  This was done at [facility] orth[facility].  She does ambulate with a cane.  I have replaced her right hip.  She reports low back pain across her lower lumbar spine that does radiate into her sciatic area on the right side and she feels right thigh pain and weakness going down her leg.  On exam her right hip exam is entirely normal in terms of the hip replacement.  There is no muscle atrophy around the hip.  She does have a positive straight leg raise on the right side but most of her pain seems to be at the lower lumbar spine to the right and left and it does radiate into the right sciatic area.  I do not sense any strength deficits in her distal lower extremities.  The MRI of her lumbar spine does show disc bulges at L3-L4, L4-L5 and L5-S1.  I can see these on the study that showed this to her.  The radiologist says that there is certainly some stenosis at L3-L4 and L5-S1.  She would like to try an epidural steroid injection.  I do feel that she would benefit from physical therapy but she is still like to try an injection first.  I am not sure whether L3-L4 versus L5-S1 is warranted.  Based on her symptoms it may be more facet joint as well versus an ESI at L3-L4 versus L5-S1.  Will refer her to Dr. Alvester Morin to consider an intervention in her spine.  He can then get her back to [place] at some point later.

## 2022-12-10 ENCOUNTER — Telehealth: Payer: Self-pay | Admitting: Family Medicine

## 2022-12-10 ENCOUNTER — Other Ambulatory Visit: Payer: Self-pay | Admitting: Physical Medicine and Rehabilitation

## 2022-12-10 DIAGNOSIS — J302 Other seasonal allergic rhinitis: Secondary | ICD-10-CM

## 2022-12-10 DIAGNOSIS — I10 Essential (primary) hypertension: Secondary | ICD-10-CM

## 2022-12-10 DIAGNOSIS — M25471 Effusion, right ankle: Secondary | ICD-10-CM

## 2022-12-10 MED ORDER — FUROSEMIDE 40 MG PO TABS: 20.0000 mg | ORAL_TABLET | Freq: Every day | ORAL | 3 refills | Status: DC

## 2022-12-10 MED ORDER — LEVOCETIRIZINE DIHYDROCHLORIDE 5 MG PO TABS: 5.0000 mg | ORAL_TABLET | Freq: Every evening | ORAL | 1 refills | Status: DC

## 2022-12-10 MED ORDER — DIAZEPAM 5 MG PO TABS: ORAL_TABLET | ORAL | 0 refills | Status: DC

## 2022-12-10 NOTE — Telephone Encounter (Signed)
Patient requesting prescriptions furosemide (LASIX) 40 MG tablet, levocetirizine (XYZAL) 5 MG tablet be sent to [id number] - [place], [place] - [address] AT [facility] [address] Phone: [phone]  Fax: [phone]

## 2022-12-10 NOTE — Addendum Note (Signed)
Addended by: Elwin Mocha on: 12/10/2022 03:05 PM   Modules accepted: Orders

## 2022-12-10 NOTE — Telephone Encounter (Signed)
Refills sent to requested pharmacy. 

## 2022-12-20 ENCOUNTER — Other Ambulatory Visit: Payer: Self-pay

## 2022-12-20 ENCOUNTER — Ambulatory Visit (INDEPENDENT_AMBULATORY_CARE_PROVIDER_SITE_OTHER): Payer: PPO | Admitting: Physical Medicine and Rehabilitation

## 2022-12-20 VITALS — BP 132/57 | HR 75

## 2022-12-20 DIAGNOSIS — M5416 Radiculopathy, lumbar region: Secondary | ICD-10-CM

## 2022-12-20 MED ORDER — METHYLPREDNISOLONE ACETATE 80 MG/ML IJ SUSP
80.0000 mg | Freq: Once | INTRAMUSCULAR | Status: AC
Start: 2022-12-20 — End: 2022-12-20
  Administered 2022-12-20: 80 mg

## 2022-12-20 NOTE — Progress Notes (Signed)
Functional Pain Scale - descriptive words and definitions  Distressing (6)    Pain is present/unable to complete most ADLs limited by pain/sleep is difficult and active distraction is only marginal. Moderate range order  Average Pain 7   +Driver, -BT, -Dye Allergies.  Lower back pain on right side that radiates down the right

## 2022-12-20 NOTE — Patient Instructions (Signed)

## 2022-12-25 NOTE — Progress Notes (Signed)
Renee Watts - 72 y.o. female MRN [id number]  Date of birth: 12-06-1950  Office Visit Note: Visit Date: 12/20/2022 PCP: Deeann Saint, MD Referred by: Deeann Saint, MD  Subjective: Chief Complaint  Patient presents with   Lower Back - Pain   HPI:  Renee Watts is a 72 y.o. female who comes in today at the request of Dr. Doneen Poisson for planned Right L3-4 Lumbar Transforaminal epidural steroid injection with fluoroscopic guidance.  The patient has failed conservative care including home exercise, medications, time and activity modification.  This injection will be diagnostic and hopefully therapeutic.  Please see requesting physician notes for further details and justification.   ROS Otherwise per HPI.  Assessment & Plan: Visit Diagnoses:    ICD-10-CM   1. Lumbar radiculopathy  M54.16 XR C-ARM NO REPORT    Epidural Steroid injection    methylPREDNISolone acetate (DEPO-MEDROL) injection 80 mg      Plan: No additional findings.   Meds & Orders:  Meds ordered this encounter  Medications   methylPREDNISolone acetate (DEPO-MEDROL) injection 80 mg    Orders Placed This Encounter  Procedures   XR C-ARM NO REPORT   Epidural Steroid injection    Follow-up: Return for visit to requesting provider as needed.   Procedures: No procedures performed  Lumbosacral Transforaminal Epidural Steroid Injection - Sub-Pedicular Approach with Fluoroscopic Guidance  Patient: Renee Watts      Date of Birth: May 14, 1951 MRN: [id number] PCP: Deeann Saint, MD      Visit Date: 12/20/2022   Universal Protocol:    Date/Time: 12/20/2022  Consent Given By: the patient  Position: PRONE  Additional Comments: Vital signs were monitored before and after the procedure. Patient was prepped and draped in the usual sterile fashion. The correct patient, procedure, and site was verified.   Injection Procedure Details:   Procedure diagnoses: Lumbar radiculopathy  [M54.16]    Meds Administered:  Meds ordered this encounter  Medications   methylPREDNISolone acetate (DEPO-MEDROL) injection 80 mg    Laterality: Right  Location/Site: L3  Needle:5.0 in., 22 ga.  Short bevel or Quincke spinal needle  Needle Placement: Transforaminal  Findings:    -Comments: Excellent flow of contrast along the nerve, nerve root and into the epidural space.  Procedure Details: After squaring off the end-plates to get a true AP view, the C-arm was positioned so that an oblique view of the foramen as noted above was visualized. The target area is just inferior to the "nose of the scotty dog" or sub pedicular. The soft tissues overlying this structure were infiltrated with 2-3 ml. of 1% Lidocaine without Epinephrine.  The spinal needle was inserted toward the target using a "trajectory" view along the fluoroscope beam.  Under AP and lateral visualization, the needle was advanced so it did not puncture dura and was located close the 6 O'Clock position of the pedical in AP tracterory. Biplanar projections were used to confirm position. Aspiration was confirmed to be negative for CSF and/or blood. A 1-2 ml. volume of Isovue-250 was injected and flow of contrast was noted at each level. Radiographs were obtained for documentation purposes.   After attaining the desired flow of contrast documented above, a 0.5 to 1.0 ml test dose of 0.25% Marcaine was injected into each respective transforaminal space.  The patient was observed for 90 seconds post injection.  After no sensory deficits were reported, and normal lower extremity motor function was noted,   the above injectate  was administered so that equal amounts of the injectate were placed at each foramen (level) into the transforaminal epidural space.   Additional Comments:  The patient tolerated the procedure well Dressing: 2 x 2 sterile gauze and Band-Aid    Post-procedure details: Patient was observed during the  procedure. Post-procedure instructions were reviewed.  Patient left the clinic in stable condition.    Clinical History: CLINICAL DATA: Worsening low back pain and right leg pain with weakness. Recent falls.  EXAM: MRI LUMBAR SPINE WITHOUT CONTRAST  TECHNIQUE: Multiplanar, multisequence MR imaging of the lumbar spine was performed. No intravenous contrast was administered.  COMPARISON: None Available.  FINDINGS: Segmentation: The lowest lumbar type non-rib-bearing vertebra is labeled as L5.  Alignment: 5 mm degenerative anterolisthesis at L3-4. No pars defects.  Vertebrae: Disc desiccation throughout the lumbar spine with loss of disc height at L3-4, L5-S1, especially L4-5. Type 2 degenerative endplate findings at L4-5 and L5-S1, and to a lesser extent posteriorly along the L2 inferior endplate.  Conus medullaris and cauda equina: Conus extends to the L1-2 level. Conus and cauda equina appear normal.  Paraspinal and other soft tissues: Unremarkable  Disc levels:  T11-12: Suspected disc bulge. This level is only included on the parasagittal images.  T12-L1: Unremarkable  L1-2: Unremarkable  L2-3: No impingement. Mild disc bulge  L3-4: Prominent central narrowing of the thecal sac with moderate left and mild right subarticular lateral recess stenosis as well as mild left and borderline right foraminal stenosis due to disc uncovering, central disc protrusion, disc bulge, and degenerative facet arthropathy.  L4-5: Mild left and borderline right subarticular lateral recess stenosis with mild left and borderline right foraminal stenosis and borderline central narrowing of the thecal sac due to central disc protrusion, disc bulge, and facet arthropathy.  L5-S1: Mild right foraminal stenosis and mild right subarticular lateral recess stenosis due to right paracentral disc protrusion, disc bulge, and intervertebral and facet spurring.  IMPRESSION: 1. Lumbar  spondylosis and degenerative disc disease, causing prominent impingement at L3-4; and mild impingement L4-5 and L5-S1. 2. 5 mm degenerative anterolisthesis at L3-4.   Electronically Signed By: Gaylyn Rong M.D. On: 11/21/2022 15:59     Objective:  VS:  HT:    WT:   BMI:     BP:(!) 132/57  HR:75bpm  TEMP: ( )  RESP:  Physical Exam Vitals and nursing note reviewed.  Constitutional:      General: She is not in acute distress.    Appearance: Normal appearance. She is not ill-appearing.  HENT:     Head: Normocephalic and atraumatic.     Right Ear: External ear normal.     Left Ear: External ear normal.  Eyes:     Extraocular Movements: Extraocular movements intact.  Cardiovascular:     Rate and Rhythm: Normal rate.     Pulses: Normal pulses.  Pulmonary:     Effort: Pulmonary effort is normal. No respiratory distress.  Abdominal:     General: There is no distension.     Palpations: Abdomen is soft.  Musculoskeletal:        General: Tenderness present.     Cervical back: Neck supple.     Right lower leg: No edema.     Left lower leg: No edema.     Comments: Patient has good distal strength with no pain over the greater trochanters.  No clonus or focal weakness.  Skin:    Findings: No erythema, lesion or rash.  Neurological:  General: No focal deficit present.     Mental Status: She is alert and oriented to person, place, and time.     Sensory: No sensory deficit.     Motor: No weakness or abnormal muscle tone.     Coordination: Coordination normal.  Psychiatric:        Mood and Affect: Mood normal.        Behavior: Behavior normal.      Imaging: No results found.

## 2022-12-25 NOTE — Procedures (Signed)
Lumbosacral Transforaminal Epidural Steroid Injection - Sub-Pedicular Approach with Fluoroscopic Guidance  Patient: Renee Watts      Date of Birth: October 11, 1950 MRN: [id number] PCP: Deeann Saint, MD      Visit Date: 12/20/2022   Universal Protocol:    Date/Time: 12/20/2022  Consent Given By: the patient  Position: PRONE  Additional Comments: Vital signs were monitored before and after the procedure. Patient was prepped and draped in the usual sterile fashion. The correct patient, procedure, and site was verified.   Injection Procedure Details:   Procedure diagnoses: Lumbar radiculopathy [M54.16]    Meds Administered:  Meds ordered this encounter  Medications   methylPREDNISolone acetate (DEPO-MEDROL) injection 80 mg    Laterality: Right  Location/Site: L3  Needle:5.0 in., 22 ga.  Short bevel or Quincke spinal needle  Needle Placement: Transforaminal  Findings:    -Comments: Excellent flow of contrast along the nerve, nerve root and into the epidural space.  Procedure Details: After squaring off the end-plates to get a true AP view, the C-arm was positioned so that an oblique view of the foramen as noted above was visualized. The target area is just inferior to the "nose of the scotty dog" or sub pedicular. The soft tissues overlying this structure were infiltrated with 2-3 ml. of 1% Lidocaine without Epinephrine.  The spinal needle was inserted toward the target using a "trajectory" view along the fluoroscope beam.  Under AP and lateral visualization, the needle was advanced so it did not puncture dura and was located close the 6 O'Clock position of the pedical in AP tracterory. Biplanar projections were used to confirm position. Aspiration was confirmed to be negative for CSF and/or blood. A 1-2 ml. volume of Isovue-250 was injected and flow of contrast was noted at each level. Radiographs were obtained for documentation purposes.   After attaining the desired  flow of contrast documented above, a 0.5 to 1.0 ml test dose of 0.25% Marcaine was injected into each respective transforaminal space.  The patient was observed for 90 seconds post injection.  After no sensory deficits were reported, and normal lower extremity motor function was noted,   the above injectate was administered so that equal amounts of the injectate were placed at each foramen (level) into the transforaminal epidural space.   Additional Comments:  The patient tolerated the procedure well Dressing: 2 x 2 sterile gauze and Band-Aid    Post-procedure details: Patient was observed during the procedure. Post-procedure instructions were reviewed.  Patient left the clinic in stable condition.

## 2023-02-14 DIAGNOSIS — H02535 Eyelid retraction left lower eyelid: Secondary | ICD-10-CM | POA: Diagnosis not present

## 2023-02-14 DIAGNOSIS — H524 Presbyopia: Secondary | ICD-10-CM | POA: Diagnosis not present

## 2023-02-14 DIAGNOSIS — E119 Type 2 diabetes mellitus without complications: Secondary | ICD-10-CM | POA: Diagnosis not present

## 2023-02-14 DIAGNOSIS — H02532 Eyelid retraction right lower eyelid: Secondary | ICD-10-CM | POA: Diagnosis not present

## 2023-02-14 DIAGNOSIS — H25813 Combined forms of age-related cataract, bilateral: Secondary | ICD-10-CM | POA: Diagnosis not present

## 2023-02-14 LAB — HM DIABETES EYE EXAM

## 2023-02-18 ENCOUNTER — Telehealth: Payer: Self-pay | Admitting: Family Medicine

## 2023-02-18 NOTE — Telephone Encounter (Signed)
Prescription Request  02/18/2023  LOV: 11/07/2022  What is the name of the medication or equipment?  traMADol (ULTRAM) 50 MG tablet   Have you contacted your pharmacy to request a refill? Yes   Which pharmacy would you like this sent to?  [id number] - , [place] - [address] AT [address] Phone: [phone]  Fax: [phone]       Patient notified that their request is being sent to the clinical staff for review and that they should receive a response within 2 business days.   Please advise at Mobile [phone] (mobile)

## 2023-02-20 NOTE — Telephone Encounter (Signed)
ATC pt to confirm pharmacy, no answer and no VM option.

## 2023-02-21 ENCOUNTER — Other Ambulatory Visit: Payer: Self-pay | Admitting: Family Medicine

## 2023-02-21 DIAGNOSIS — G8929 Other chronic pain: Secondary | ICD-10-CM

## 2023-02-21 MED ORDER — TRAMADOL HCL 50 MG PO TABS
ORAL_TABLET | ORAL | 1 refills | Status: DC
Start: 2023-02-21 — End: 2023-09-20

## 2023-02-21 NOTE — Telephone Encounter (Signed)
Prescription sent

## 2023-03-14 ENCOUNTER — Other Ambulatory Visit: Payer: Self-pay | Admitting: Family Medicine

## 2023-03-18 ENCOUNTER — Ambulatory Visit (INDEPENDENT_AMBULATORY_CARE_PROVIDER_SITE_OTHER): Payer: PPO

## 2023-03-18 VITALS — Ht 65.5 in | Wt 164.0 lb

## 2023-03-18 DIAGNOSIS — Z0000 Encounter for general adult medical examination without abnormal findings: Secondary | ICD-10-CM

## 2023-03-18 NOTE — Progress Notes (Signed)
Subjective:   Renee Watts is a 72 y.o. female who presents for Medicare Annual (Subsequent) preventive examination.  Visit Complete: Virtual  I connected with  Renee Watts on 03/18/23 by a audio enabled telemedicine application and verified that I am speaking with the correct person using two identifiers.  Patient Location: Home  Provider Location: Home Office  I discussed the limitations of evaluation and management by telemedicine. The patient expressed understanding and agreed to proceed.  Patient Medicare AWV questionnaire was completed by the patient on ; I have confirmed that all information answered by patient is correct and no changes since this date.  Review of Systems     Cardiac Risk Factors include: advanced age (>18men, >17 women);diabetes mellitus;hypertension     Objective:    Today's Vitals   03/18/23 0850  Weight: 164 lb (74.4 kg)  Height: 5' 5.5" (1.664 m)   Body mass index is 26.88 kg/m.     03/18/2023    9:00 AM 03/13/2022    8:54 AM 05/19/2021    7:37 AM 03/09/2021    2:42 PM 03/08/2020   10:43 AM 04/14/2018   10:03 AM 04/08/2018    9:37 AM  Advanced Directives  Does Patient Have a Medical Advance Directive? No No No Yes No No Yes  Type of Theme park manager;Living will   Healthcare Power of Attorney  Does patient want to make changes to medical advance directive?       No - Patient declined  Copy of Healthcare Power of Attorney in Chart?    No - copy requested     Would patient like information on creating a medical advance directive? No - Patient declined No - Patient declined No - Patient declined  No - Patient declined      Current Medications (verified) Outpatient Encounter Medications as of 03/18/2023  Medication Sig   Accu-Chek Softclix Lancets lancets daily. for testing as directed   acetaminophen (TYLENOL) 650 MG CR tablet Take by mouth.   acetaminophen-codeine (TYLENOL #3) 300-30 MG tablet Take  1-2 tablets by mouth every 8 (eight) hours as needed for moderate pain.   ALPRAZolam (XANAX) 0.25 MG tablet Take 1 tablet (0.25 mg total) by mouth 2 (two) times daily as needed for anxiety.   Blood Glucose Monitoring Suppl (ONETOUCH VERIO IQ SYSTEM) W/DEVICE KIT by Does not apply route.   CALCIUM PO Take by mouth daily.   celecoxib (CELEBREX) 200 MG capsule Take 1 capsule (200 mg total) by mouth 2 (two) times daily between meals as needed.   Cyanocobalamin (B-12 PO) Take by mouth daily.   cyclobenzaprine (FLEXERIL) 5 MG tablet Take 1 tablet (5 mg total) by mouth 3 (three) times daily as needed for muscle spasms.   diazepam (VALIUM) 5 MG tablet Take one tablet by mouth with food one hour prior to procedure. May repeat 30 minutes prior if needed.   fluticasone (FLONASE) 50 MCG/ACT nasal spray Place 1 spray into both nostrils daily. (Patient taking differently: Place 1 spray into both nostrils daily as needed.)   furosemide (LASIX) 40 MG tablet Take 0.5 tablets (20 mg total) by mouth daily.   gabapentin (NEURONTIN) 300 MG capsule Take 1 capsule (300 mg) in a.m. and at lunch.  Take 2 capsules (600 mg) nightly.   ketotifen (ZADITOR) 0.025 % ophthalmic solution Place 1 drop into both eyes as needed (ALLERGIES).   Lancets (ONETOUCH DELICA PLUS LANCET30G) MISC USE DAILY AS DIRECTED  levocetirizine (XYZAL) 5 MG tablet Take 1 tablet (5 mg total) by mouth every evening.   lisinopril (ZESTRIL) 20 MG tablet TAKE 1 TABLET BY MOUTH EVERY DAY   metFORMIN (GLUCOPHAGE-XR) 750 MG 24 hr tablet Take 1 tablet (750 mg total) by mouth daily with breakfast.   omeprazole (PRILOSEC) 10 MG capsule Take 10 mg by mouth daily as needed.   ONETOUCH VERIO test strip USE DAILY AS DIRECTED   potassium chloride SA (KLOR-CON M) 20 MEQ tablet TAKE 1 TABLET(20 MEQ) BY MOUTH DAILY   traMADol (ULTRAM) 50 MG tablet TAKE 1 TABLET(50 MG) BY MOUTH EVERY 8 HOURS AS NEEDED FOR SEVERE PAIN   VITAMIN D PO Take by mouth daily.   Vitamin D,  Ergocalciferol, (DRISDOL) 1.25 MG (50000 UNIT) CAPS capsule Take 1 capsule (50,000 Units total) by mouth every 7 (seven) days.   No facility-administered encounter medications on file as of 03/18/2023.    Allergies (verified) Patient has no known allergies.   History: Past Medical History:  Diagnosis Date   Allergy    Benign paroxysmal positional vertigo 06/25/2013   dx with prior PCP, s/p labs, mri and vestibular rehab; nasal spray for etd seemed to help   Blood in stool 03/11/2012   Dependent edema    Bilateral   Depression    GERD (gastroesophageal reflux disease)    on meds   Hyperlipidemia    on meds   Low back pain    sciatica; sees orthopedic specialist ([facility])for this and shoulder tendinitis   Obesity, diabetes, and hypertension syndrome (HCC)    on med   Osteoarthritis, knee    Bilateral, s/p 3 total knee replacement surgerues on right, last 2005. Dr. Shelle Iron   Pellegrini-Steida syndrome    Myositis ossificans   Seasonal allergies    Shingles 07/10/2012   LESIONS CLEARED BUT NOT TOLERATING ANY THING TIGHT AGAINST BODY--SHINGLES WERE AROSS BACK AND ABDOMEN   Past Surgical History:  Procedure Laterality Date   ABDOMINAL HYSTERECTOMY  1984   BLADDER SURGERY  03/2017   bladder tac per pt   COLONOSCOPY  2020   MS-MAC-goly (good)-hems/TAx5)   left shoulder steroid injection     POLYPECTOMY  2020   TA x 5   TONSILLECTOMY  1977   TOTAL HIP ARTHROPLASTY Right 10/10/2012   Procedure: R TOTAL HIP ARTHROPLASTY ANTERIOR APPROACH;  Surgeon: Kathryne Hitch, MD;  Location: [facility];  Service: Orthopedics;  Laterality: Right;  Right Total Hip Arthroplasty, Anterior Approach (C-Arm)   TOTAL KNEE ARTHROPLASTY Right    with multiple revisions, 2003, 2005   TOTAL KNEE ARTHROPLASTY Left 01/09/2013   Procedure: LEFT TOTAL KNEE ARTHROPLASTY;  Surgeon: Kathryne Hitch, MD;  Location: [facility];  Service: Orthopedics;  Laterality: Left;   TUBAL LIGATION  1978    WISDOM TOOTH EXTRACTION  2013,2014   Family History  Problem Relation Age of Onset   Diabetes Mother    Hypertension Mother    Hypertension Father    Cancer Father        Lung CA   Coronary artery disease Father    Colon cancer Neg Hx    Colon polyps Neg Hx    Esophageal cancer Neg Hx    Rectal cancer Neg Hx    Stomach cancer Neg Hx    Social History   Socioeconomic History   Marital status: Married    Spouse name: Not on file   Number of children: Not on file   Years of education:  12th grade   Highest education level: Not on file  Occupational History    Employer: [employer]  Tobacco Use   Smoking status: Never   Smokeless tobacco: Never  Vaping Use   Vaping status: Never Used  Substance and Sexual Activity   Alcohol use: No   Drug use: No   Sexual activity: Not on file  Other Topics Concern   Not on file  Social History Narrative   Married, 3 daughters.   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   Nicotine: never   ETOH: no   Drugs: no   Social Determinants of Corporate investment banker Strain: Low Risk  (03/18/2023)   Overall Financial Resource Strain (CARDIA)    Difficulty of Paying Living Expenses: Not hard at all  Food Insecurity: No Food Insecurity (03/18/2023)   Hunger Vital Sign    Worried About Running Out of Food in the Last Year: Never true    Ran Out of Food in the Last Year: Never true  Transportation Needs: No Transportation Needs (03/18/2023)   PRAPARE - Administrator, Civil Service (Medical): No    Lack of Transportation (Non-Medical): No  Physical Activity: Inactive (03/18/2023)   Exercise Vital Sign    Days of Exercise per Week: 0 days    Minutes of Exercise per Session: 0 min  Stress: No Stress Concern Present (03/18/2023)   [employer] of Occupational Health - Occupational Stress Questionnaire    Feeling of Stress : Not at all  Social Connections: Socially Integrated (03/18/2023)    Social Connection and Isolation Panel [NHANES]    Frequency of Communication with Friends and Family: More than three times a week    Frequency of Social Gatherings with Friends and Family: More than three times a week    Attends Religious Services: More than 4 times per year    Active Member of [employer] or Organizations: Yes    Attends Engineer, structural: More than 4 times per year    Marital Status: Married    Tobacco Counseling Counseling given: Not Answered   Clinical Intake:  Pre-visit preparation completed: No  Pain : No/denies pain     BMI - recorded: 26.88 Nutritional Status: BMI 25 -29 Overweight Nutritional Risks: None Diabetes: Yes CBG done?: No Did pt. bring in CBG monitor from home?: No  How often do you need to have someone help you when you read instructions, pamphlets, or other written materials from your doctor or pharmacy?: 1 - Never  Interpreter Needed?: No  Information entered by :: Renee Watts   Activities of Daily Living    03/18/2023    8:58 AM  In your present state of health, do you have any difficulty performing the following activities:  Hearing? 0  Vision? 0  Difficulty concentrating or making decisions? 0  Walking or climbing stairs? 0  Dressing or bathing? 0  Doing errands, shopping? 0  Preparing Food and eating ? N  Using the Toilet? N  In the past six months, have you accidently leaked urine? N  Do you have problems with loss of bowel control? N  Managing your Medications? N  Managing your Finances? N  Housekeeping or managing your Housekeeping? N    Patient Care Team: Deeann Saint, MD as PCP - General (Family Medicine) Plyler, Cecil Cranker, RD as Dietitian (Dietician) Nadyne Coombes, MD (Ophthalmology) Ilda Mori, MD as Consulting Physician (Obstetrics and Gynecology)  Indicate any recent Medical Services you may have received from other than [facility] providers in the past year (date may be  approximate).     Assessment:   This is a routine wellness examination for [place].  Hearing/Vision screen Hearing Screening - Comments:: Denies hearing difficulties   Vision Screening - Comments:: Wears rx glasses - up to date with routine eye exams with  Dr Dione Booze  Dietary issues and exercise activities discussed:     Goals Addressed               This Visit's Progress     Get strength back in legs (pt-stated)        Get back to speed walking and balance.        Depression Screen    03/18/2023    8:57 AM 11/07/2022    8:14 AM 08/08/2022    8:13 AM 06/07/2022    9:45 AM 03/13/2022    8:49 AM 12/11/2021   10:17 AM 08/03/2021   11:01 AM  PHQ 2/9 Scores  PHQ - 2 Score 0 0 1 0 0 3 4  PHQ- 9 Score  2 9   9 10     Fall Risk    03/18/2023    8:58 AM 11/07/2022    8:14 AM 08/08/2022    8:13 AM 06/07/2022    9:45 AM 03/13/2022    8:52 AM  Fall Risk   Falls in the past year? 0 0 1 1 1   Number falls in past yr: 0 0 0 0 0  Injury with Fall? 0 0 0 0 0  Comment     No injury. Followed by PCP  Risk for fall due to : No Fall Risks  Other (Comment) Other (Comment) No Fall Risks  Follow up Falls prevention discussed Falls evaluation completed Falls evaluation completed Falls evaluation completed     MEDICARE RISK AT HOME:  Medicare Risk at Home - 03/18/23 0908     Any stairs in or around the home? Yes    If so, are there any without handrails? No    Home free of loose throw rugs in walkways, pet beds, electrical cords, etc? Yes    Adequate lighting in your home to reduce risk of falls? Yes    Life alert? No    Use of a cane, walker or w/c? No    Grab bars in the bathroom? Yes    Shower chair or bench in shower? No    Elevated toilet seat or a handicapped toilet? Yes             TIMED UP AND GO:  Was the test performed?  No    Cognitive Function:    04/14/2018   10:20 AM 04/12/2017   12:54 PM  MMSE - Mini Mental State Exam  Not completed: -- --         03/18/2023    9:00 AM 03/13/2022    8:55 AM 03/08/2020   10:49 AM  6CIT Screen  What Year? 0 points 0 points 0 points  What month? 0 points 0 points 0 points  What time? 0 points 0 points 0 points  Count back from 20 0 points 0 points 0 points  Months in reverse 0 points 0 points 0 points  Repeat phrase 0 points 0 points 0 points  Total Score 0 points 0 points 0 points    Immunizations Immunization History  Administered Date(s) Administered   Fluad Quad(high Dose 65+) 06/27/2020,  05/03/2021, 06/07/2022   Influenza Split 06/04/2011, 05/07/2012   Influenza Whole 07/08/2007, 05/27/2008, 07/14/2009, 07/18/2010   Influenza, High Dose Seasonal PF 05/10/2015, 05/10/2016, 08/26/2017, 06/11/2018, 05/30/2019   Influenza,inj,Quad PF,6+ Mos 05/26/2013, 06/14/2014   Influenza-Unspecified 05/27/2016   PFIZER(Purple Top)SARS-COV-2 Vaccination 10/19/2019, 11/09/2019, 06/03/2020   Pneumococcal Conjugate-13 05/10/2015   Pneumococcal Polysaccharide-23 07/18/2010, 05/10/2016   Td 07/18/2010   Tdap 08/03/2021    TDAP status: Up to date  Flu Vaccine status: Up to date  Pneumococcal vaccine status: Up to date  Covid-19 vaccine status: Completed vaccines  Qualifies for Shingles Vaccine? Yes   Zostavax completed No   Shingrix Completed?: No.    Education has been provided regarding the importance of this vaccine. Patient has been advised to call insurance company to determine out of pocket expense if they have not yet received this vaccine. Advised may also receive vaccine at local pharmacy or Health Dept. Verbalized acceptance and understanding.  Screening Tests Health Maintenance  Topic Date Due   FOOT EXAM  01/21/2021   COVID-19 Vaccine (4 - 2023-24 season) 04/03/2023 (Originally 04/27/2022)   Zoster Vaccines- Shingrix (1 of 2) 06/18/2023 (Originally 03/12/2000)   INFLUENZA VACCINE  03/28/2023   HEMOGLOBIN A1C  05/10/2023   MAMMOGRAM  10/13/2023   Diabetic kidney evaluation - eGFR measurement   11/07/2023   LIPID PANEL  11/07/2023   Diabetic kidney evaluation - Urine ACR  11/22/2023   OPHTHALMOLOGY EXAM  02/14/2024   Medicare Annual Wellness (AWV)  03/17/2024   Colonoscopy  05/01/2025   DTaP/Tdap/Td (3 - Td or Tdap) 08/04/2031   Pneumonia Vaccine 58+ Years old  Completed   DEXA SCAN  Completed   Hepatitis C Screening  Completed   HPV VACCINES  Aged Out    Health Maintenance  Health Maintenance Due  Topic Date Due   FOOT EXAM  01/21/2021    Colorectal cancer screening: Type of screening: Colonoscopy. Completed 05/01/22. Repeat every 3 years  Mammogram status: Completed 10/12/22. Repeat every year  Bone Density status: Completed 02/13/21. Results reflect: Bone density results: OSTEOPOROSIS. Repeat every   years.  Lung Cancer Screening: (Low Dose CT Chest recommended if Age 72-80 years, 20 pack-year currently smoking OR have quit w/in 15years.) does not qualify.     Additional Screening:  Hepatitis C Screening: does qualify; Completed 09/09/15  Vision Screening: Recommended annual ophthalmology exams for early detection of glaucoma and other disorders of the eye. Is the patient up to date with their annual eye exam?  Yes  Who is the provider or what is the name of the office in which the patient attends annual eye exams? Dr Dione Booze If pt is not established with a provider, would they like to be referred to a provider to establish care? No .   Dental Screening: Recommended annual dental exams for proper oral hygiene  Diabetic Foot Exam: Diabetic Foot Exam: Overdue, Pt has been advised about the importance in completing this exam. Pt is scheduled for diabetic foot exam on Followed by PCP.  Community Resource Referral / Chronic Care Management:  CRR required this visit?  No   CCM required this visit?  No     Plan:     I have personally reviewed and noted the following in the patient's chart:   Medical and social history Use of alcohol, tobacco or illicit drugs   Current medications and supplements including opioid prescriptions. Patient currently taking opioids Functional ability and status Nutritional status Physical activity Advanced directives List of other physicians Hospitalizations,  surgeries, and ER visits in previous 12 months Vitals Screenings to include cognitive, depression, and falls Referrals and appointments  In addition, I have reviewed and discussed with patient certain preventive protocols, quality metrics, and best practice recommendations. A written personalized care plan for preventive services as well as general preventive health recommendations were provided to patient.     [name], Watts   [id number]   After Visit Summary: (Mail) Due to this being a telephonic visit, the after visit summary with patients personalized plan was offered to patient via mail   Nurse Notes: None

## 2023-03-18 NOTE — Patient Instructions (Addendum)
Renee Watts , Thank you for taking time to come for your Medicare Wellness Visit. I appreciate your ongoing commitment to your health goals. Please review the following plan we discussed and let me know if I can assist you in the future.   These are the goals we discussed:  Goals       Blood Pressure < 140/90      DIET - INCREASE WATER INTAKE      Patient will drink 6-8 cups of water per day       Get strength back in legs (pt-stated)      Get back to speed walking and balance.       HEMOGLOBIN A1C < 7.0      LDL CALC < 100      Patient Stated      I will continuing walking 20-25 minutes 3 days per week      Stay healthy (pt-stated)      Be able to walk better.        This is a list of the screening recommended for you and due dates:  Health Maintenance  Topic Date Due   Complete foot exam   01/21/2021   COVID-19 Vaccine (4 - 2023-24 season) 04/03/2023*   Zoster (Shingles) Vaccine (1 of 2) 06/18/2023*   Flu Shot  03/28/2023   Hemoglobin A1C  05/10/2023   Mammogram  10/13/2023   Yearly kidney function blood test for diabetes  11/07/2023   Lipid (cholesterol) test  11/07/2023   Yearly kidney health urinalysis for diabetes  11/22/2023   Eye exam for diabetics  02/14/2024   Medicare Annual Wellness Visit  03/17/2024   Colon Cancer Screening  05/01/2025   DTaP/Tdap/Td vaccine (3 - Td or Tdap) 08/04/2031   Pneumonia Vaccine  Completed   DEXA scan (bone density measurement)  Completed   Hepatitis C Screening  Completed   HPV Vaccine  Aged Out  *Topic was postponed. The date shown is not the original due date.   Opioid Pain Medicine Management Opioids are powerful medicines that are used to treat moderate to severe pain. When used for short periods of time, they can help you to: Sleep better. Do better in physical or occupational therapy. Feel better in the first few days after an injury. Recover from surgery. Opioids should be taken with the supervision of a trained  health care provider. They should be taken for the shortest period of time possible. This is because opioids can be addictive, and the longer you take opioids, the greater your risk of addiction. This addiction can also be called opioid use disorder. What are the risks? Using opioid pain medicines for longer than 3 days increases your risk of side effects. Side effects include: Constipation. Nausea and vomiting. Breathing difficulties (respiratory depression). Drowsiness. Confusion. Opioid use disorder. Itching. Taking opioid pain medicine for a long period of time can affect your ability to do daily tasks. It also puts you at risk for: Motor vehicle crashes. Depression. Suicide. Heart attack. Overdose, which can be life-threatening. What is a pain treatment plan? A pain treatment plan is an agreement between you and your health care provider. Pain is unique to each person, and treatments vary depending on your condition. To manage your pain, you and your health care provider need to work together. To help you do this: Discuss the goals of your treatment, including how much pain you might expect to have and how you will manage the pain. Review the risks and  benefits of taking opioid medicines. Remember that a good treatment plan uses more than one approach and minimizes the chance of side effects. Be honest about the amount of medicines you take and about any drug or alcohol use. Get pain medicine prescriptions from only one health care provider. Pain can be managed with many types of alternative treatments. Ask your health care provider to refer you to one or more specialists who can help you manage pain through: Physical or occupational therapy. Counseling (cognitive behavioral therapy). Good nutrition. Biofeedback. Massage. Meditation. Non-opioid medicine. Following a gentle exercise program. How to use opioid pain medicine Taking medicine Take your pain medicine exactly as told  by your health care provider. Take it only when you need it. If your pain gets less severe, you may take less than your prescribed dose if your health care provider approves. If you are not having pain, do nottake pain medicine unless your health care provider tells you to take it. If your pain is severe, do nottry to treat it yourself by taking more pills than instructed on your prescription. Contact your health care provider for help. Write down the times when you take your pain medicine. It is easy to become confused while on pain medicine. Writing the time can help you avoid overdose. Take other over-the-counter or prescription medicines only as told by your health care provider. Keeping yourself and others safe  While you are taking opioid pain medicine: Do not drive, use machinery, or power tools. Do not sign legal documents. Do not drink alcohol. Do not take sleeping pills. Do not supervise children by yourself. Do not do activities that require climbing or being in high places. Do not go to a lake, river, ocean, spa, or swimming pool. Do not share your pain medicine with anyone. Keep pain medicine in a locked cabinet or in a secure area where pets and children cannot reach it. Stopping your use of opioids If you have been taking opioid medicine for more than a few weeks, you may need to slowly decrease (taper) how much you take until you stop completely. Tapering your use of opioids can decrease your risk of symptoms of withdrawal, such as: Pain and cramping in the abdomen. Nausea. Sweating. Sleepiness. Restlessness. Uncontrollable shaking (tremors). Cravings for the medicine. Do not attempt to taper your use of opioids on your own. Talk with your health care provider about how to do this. Your health care provider may prescribe a step-down schedule based on how much medicine you are taking and how long you have been taking it. Getting rid of leftover pills Do not save any  leftover pills. Get rid of leftover pills safely by: Taking the medicine to a prescription take-back program. This is usually offered by the county or law enforcement. Bringing them to a pharmacy that has a drug disposal container. Flushing them down the toilet. Check the label or package insert of your medicine to see whether this is safe to do. Throwing them out in the trash. Check the label or package insert of your medicine to see whether this is safe to do. If it is safe to throw it out, remove the medicine from the original container, put it into a sealable bag or container, and mix it with used coffee grounds, food scraps, dirt, or cat litter before putting it in the trash. Follow these instructions at home: Activity Do exercises as told by your health care provider. Avoid activities that make your pain worse. Return to  your normal activities as told by your health care provider. Ask your health care provider what activities are safe for you. General instructions You may need to take these actions to prevent or treat constipation: Drink enough fluid to keep your urine pale yellow. Take over-the-counter or prescription medicines. Eat foods that are high in fiber, such as beans, whole grains, and fresh fruits and vegetables. Limit foods that are high in fat and processed sugars, such as fried or sweet foods. Keep all follow-up visits. This is important. Where to find support If you have been taking opioids for a long time, you may benefit from receiving support for quitting from a local support group or counselor. Ask your health care provider for a referral to these resources in your area. Where to find more information Centers for Disease Control and Prevention (CDC): FootballExhibition.com.br [place] Food and Drug Administration (FDA): PumpkinSearch.com.ee Get help right away if: You may have taken too much of an opioid (overdosed). Common symptoms of an overdose: Your breathing is slower or more shallow than  normal. You have a very slow heartbeat (pulse). You have slurred speech. You have nausea and vomiting. Your pupils become very small. You have other potential symptoms: You are very confused. You faint or feel like you will faint. You have cold, clammy skin. You have blue lips or fingernails. You have thoughts of harming yourself or harming others. These symptoms may represent a serious problem that is an emergency. Do not wait to see if the symptoms will go away. Get medical help right away. Call your local emergency services (911 in the [place] Do not drive yourself to the hospital.  If you ever feel like you may hurt yourself or others, or have thoughts about taking your own life, get help right away. Go to your nearest emergency department or: Call your local emergency services (911 in the [place] Call the [facility] (619-508-9530 in the [place] Call a suicide crisis helpline, such as the National Suicide Prevention Lifeline at 772-544-2681 or 988 in the [place] This is open 24 hours a day in the [place] Text the Crisis Text Line at 208-766-4850 (in the [place] Summary Opioid medicines can help you manage moderate to severe pain for a short period of time. A pain treatment plan is an agreement between you and your health care provider. Discuss the goals of your treatment, including how much pain you might expect to have and how you will manage the pain. If you think that you or someone else may have taken too much of an opioid, get medical help right away. This information is not intended to replace advice given to you by your health care provider. Make sure you discuss any questions you have with your health care provider. Document Revised: 03/08/2021 Document Reviewed: 11/23/2020 Elsevier Patient Education  2024 [employer]  Advanced directives: Advance directive discussed with you today. Even though you declined this today, please call our office should you change your  mind, and we can give you the proper paperwork for you to fill out.   Conditions/risks identified: None  Next appointment: Follow up in one year for your annual wellness visit    Preventive Care 65 Years and Older, Female Preventive care refers to lifestyle choices and visits with your health care provider that can promote health and wellness. What does preventive care include? A yearly physical exam. This is also called an annual well check. Dental exams once or twice a year. Routine eye exams.  Ask your health care provider how often you should have your eyes checked. Personal lifestyle choices, including: Daily care of your teeth and gums. Regular physical activity. Eating a healthy diet. Avoiding tobacco and drug use. Limiting alcohol use. Practicing safe sex. Taking low-dose aspirin every day. Taking vitamin and mineral supplements as recommended by your health care provider. What happens during an annual well check? The services and screenings done by your health care provider during your annual well check will depend on your age, overall health, lifestyle risk factors, and family history of disease. Counseling  Your health care provider may ask you questions about your: Alcohol use. Tobacco use. Drug use. Emotional well-being. Home and relationship well-being. Sexual activity. Eating habits. History of falls. Memory and ability to understand (cognition). Work and work Astronomer. Reproductive health. Screening  You may have the following tests or measurements: Height, weight, and BMI. Blood pressure. Lipid and cholesterol levels. These may be checked every 5 years, or more frequently if you are over 41 years old. Skin check. Lung cancer screening. You may have this screening every year starting at age 55 if you have a 30-pack-year history of smoking and currently smoke or have quit within the past 15 years. Fecal occult blood test (FOBT) of the stool. You may have  this test every year starting at age 2. Flexible sigmoidoscopy or colonoscopy. You may have a sigmoidoscopy every 5 years or a colonoscopy every 10 years starting at age 26. Hepatitis C blood test. Hepatitis B blood test. Sexually transmitted disease (STD) testing. Diabetes screening. This is done by checking your blood sugar (glucose) after you have not eaten for a while (fasting). You may have this done every 1-3 years. Bone density scan. This is done to screen for osteoporosis. You may have this done starting at age 25. Mammogram. This may be done every 1-2 years. Talk to your health care provider about how often you should have regular mammograms. Talk with your health care provider about your test results, treatment options, and if necessary, the need for more tests. Vaccines  Your health care provider may recommend certain vaccines, such as: Influenza vaccine. This is recommended every year. Tetanus, diphtheria, and acellular pertussis (Tdap, Td) vaccine. You may need a Td booster every 10 years. Zoster vaccine. You may need this after age 51. Pneumococcal 13-valent conjugate (PCV13) vaccine. One dose is recommended after age 73. Pneumococcal polysaccharide (PPSV23) vaccine. One dose is recommended after age 29. Talk to your health care provider about which screenings and vaccines you need and how often you need them. This information is not intended to replace advice given to you by your health care provider. Make sure you discuss any questions you have with your health care provider. Document Released: 09/09/2015 Document Revised: 05/02/2016 Document Reviewed: 06/14/2015 Elsevier Interactive Patient Education  2017 [employer].  Fall Prevention in the Home Falls can cause injuries. They can happen to people of all ages. There are many things you can do to make your home safe and to help prevent falls. What can I do on the outside of my home? Regularly fix the edges of walkways and  driveways and fix any cracks. Remove anything that might make you trip as you walk through a door, such as a raised step or threshold. Trim any bushes or trees on the path to your home. Use bright outdoor lighting. Clear any walking paths of anything that might make someone trip, such as rocks or tools. Regularly check to  see if handrails are loose or broken. Make sure that both sides of any steps have handrails. Any raised decks and porches should have guardrails on the edges. Have any leaves, snow, or ice cleared regularly. Use sand or salt on walking paths during winter. Clean up any spills in your garage right away. This includes oil or grease spills. What can I do in the bathroom? Use night lights. Install grab bars by the toilet and in the tub and shower. Do not use towel bars as grab bars. Use non-skid mats or decals in the tub or shower. If you need to sit down in the shower, use a plastic, non-slip stool. Keep the floor dry. Clean up any water that spills on the floor as soon as it happens. Remove soap buildup in the tub or shower regularly. Attach bath mats securely with double-sided non-slip rug tape. Do not have throw rugs and other things on the floor that can make you trip. What can I do in the bedroom? Use night lights. Make sure that you have a light by your bed that is easy to reach. Do not use any sheets or blankets that are too big for your bed. They should not hang down onto the floor. Have a firm chair that has side arms. You can use this for support while you get dressed. Do not have throw rugs and other things on the floor that can make you trip. What can I do in the kitchen? Clean up any spills right away. Avoid walking on wet floors. Keep items that you use a lot in easy-to-reach places. If you need to reach something above you, use a strong step stool that has a grab bar. Keep electrical cords out of the way. Do not use floor polish or wax that makes floors  slippery. If you must use wax, use non-skid floor wax. Do not have throw rugs and other things on the floor that can make you trip. What can I do with my stairs? Do not leave any items on the stairs. Make sure that there are handrails on both sides of the stairs and use them. Fix handrails that are broken or loose. Make sure that handrails are as long as the stairways. Check any carpeting to make sure that it is firmly attached to the stairs. Fix any carpet that is loose or worn. Avoid having throw rugs at the top or bottom of the stairs. If you do have throw rugs, attach them to the floor with carpet tape. Make sure that you have a light switch at the top of the stairs and the bottom of the stairs. If you do not have them, ask someone to add them for you. What else can I do to help prevent falls? Wear shoes that: Do not have high heels. Have rubber bottoms. Are comfortable and fit you well. Are closed at the toe. Do not wear sandals. If you use a stepladder: Make sure that it is fully opened. Do not climb a closed stepladder. Make sure that both sides of the stepladder are locked into place. Ask someone to hold it for you, if possible. Clearly mark and make sure that you can see: Any grab bars or handrails. First and last steps. Where the edge of each step is. Use tools that help you move around (mobility aids) if they are needed. These include: Canes. Walkers. Scooters. Crutches. Turn on the lights when you go into a dark area. Replace any light bulbs as soon  as they burn out. Set up your furniture so you have a clear path. Avoid moving your furniture around. If any of your floors are uneven, fix them. If there are any pets around you, be aware of where they are. Review your medicines with your doctor. Some medicines can make you feel dizzy. This can increase your chance of falling. Ask your doctor what other things that you can do to help prevent falls. This information is not  intended to replace advice given to you by your health care provider. Make sure you discuss any questions you have with your health care provider. Document Released: 06/09/2009 Document Revised: 01/19/2016 Document Reviewed: 09/17/2014 Elsevier Interactive Patient Education  2017 [employer].

## 2023-03-21 ENCOUNTER — Telehealth: Payer: Self-pay

## 2023-03-21 NOTE — Telephone Encounter (Signed)
LVM for patient to call back [phone], or to call PCP office to schedule follow up apt. AS, CMA  

## 2023-03-25 ENCOUNTER — Ambulatory Visit (INDEPENDENT_AMBULATORY_CARE_PROVIDER_SITE_OTHER): Payer: PPO

## 2023-03-25 ENCOUNTER — Ambulatory Visit: Payer: PPO | Admitting: Family Medicine

## 2023-03-25 ENCOUNTER — Encounter: Payer: Self-pay | Admitting: Family Medicine

## 2023-03-25 VITALS — BP 132/68 | HR 67 | Temp 98.1°F | Wt 161.2 lb

## 2023-03-25 DIAGNOSIS — M25561 Pain in right knee: Secondary | ICD-10-CM

## 2023-03-25 DIAGNOSIS — M25562 Pain in left knee: Secondary | ICD-10-CM | POA: Diagnosis not present

## 2023-03-25 DIAGNOSIS — E1149 Type 2 diabetes mellitus with other diabetic neurological complication: Secondary | ICD-10-CM | POA: Diagnosis not present

## 2023-03-25 DIAGNOSIS — E559 Vitamin D deficiency, unspecified: Secondary | ICD-10-CM | POA: Diagnosis not present

## 2023-03-25 DIAGNOSIS — Z96653 Presence of artificial knee joint, bilateral: Secondary | ICD-10-CM | POA: Diagnosis not present

## 2023-03-25 DIAGNOSIS — Z7984 Long term (current) use of oral hypoglycemic drugs: Secondary | ICD-10-CM | POA: Diagnosis not present

## 2023-03-25 DIAGNOSIS — M25471 Effusion, right ankle: Secondary | ICD-10-CM | POA: Diagnosis not present

## 2023-03-25 DIAGNOSIS — R739 Hyperglycemia, unspecified: Secondary | ICD-10-CM

## 2023-03-25 DIAGNOSIS — W19XXXA Unspecified fall, initial encounter: Secondary | ICD-10-CM

## 2023-03-25 DIAGNOSIS — E1165 Type 2 diabetes mellitus with hyperglycemia: Secondary | ICD-10-CM

## 2023-03-25 DIAGNOSIS — M7522 Bicipital tendinitis, left shoulder: Secondary | ICD-10-CM

## 2023-03-25 DIAGNOSIS — I10 Essential (primary) hypertension: Secondary | ICD-10-CM | POA: Diagnosis not present

## 2023-03-25 DIAGNOSIS — Z043 Encounter for examination and observation following other accident: Secondary | ICD-10-CM | POA: Diagnosis not present

## 2023-03-25 DIAGNOSIS — M25462 Effusion, left knee: Secondary | ICD-10-CM | POA: Diagnosis not present

## 2023-03-25 DIAGNOSIS — R350 Frequency of micturition: Secondary | ICD-10-CM

## 2023-03-25 LAB — COMPREHENSIVE METABOLIC PANEL
ALT: 25 U/L (ref 0–35)
AST: 15 U/L (ref 0–37)
Albumin: 4.4 g/dL (ref 3.5–5.2)
Alkaline Phosphatase: 67 U/L (ref 39–117)
BUN: 16 mg/dL (ref 6–23)
CO2: 28 mEq/L (ref 19–32)
Calcium: 10.4 mg/dL (ref 8.4–10.5)
Chloride: 101 mEq/L (ref 96–112)
Creatinine, Ser: 0.65 mg/dL (ref 0.40–1.20)
GFR: 87.58 mL/min (ref >60.00)
Glucose, Bld: 432 mg/dL — ABNORMAL HIGH (ref 70–99)
Potassium: 4.4 mEq/L (ref 3.5–5.1)
Sodium: 140 mEq/L (ref 135–145)
Total Bilirubin: 0.6 mg/dL (ref 0.2–1.2)
Total Protein: 8.5 g/dL — ABNORMAL HIGH (ref 6.0–8.3)

## 2023-03-25 LAB — CBC WITH DIFFERENTIAL/PLATELET
Basophils Absolute: 0.1 10*3/uL (ref 0.0–0.1)
Basophils Relative: 0.8 % (ref 0.0–3.0)
Eosinophils Absolute: 0.2 10*3/uL (ref 0.0–0.7)
Eosinophils Relative: 3.1 % (ref 0.0–5.0)
HCT: 37.6 % (ref 36.0–46.0)
Hemoglobin: 12.6 g/dL (ref 12.0–15.0)
Lymphocytes Relative: 28 % (ref 12.0–46.0)
Lymphs Abs: 1.9 10*3/uL (ref 0.7–4.0)
MCHC: 33.4 g/dL (ref 30.0–36.0)
MCV: 94.4 fl (ref 78.0–100.0)
Monocytes Absolute: 0.5 10*3/uL (ref 0.1–1.0)
Monocytes Relative: 7.9 % (ref 3.0–12.0)
Neutro Abs: 4.1 10*3/uL (ref 1.4–7.7)
Neutrophils Relative %: 60.2 % (ref 43.0–77.0)
Platelets: 156 10*3/uL (ref 150.0–400.0)
RBC: 3.98 Mil/uL (ref 3.87–5.11)
RDW: 13 % (ref 11.5–15.5)
WBC: 6.9 10*3/uL (ref 4.0–10.5)

## 2023-03-25 LAB — POCT URINALYSIS DIPSTICK
Bilirubin, UA: NEGATIVE
Blood, UA: NEGATIVE
Glucose, UA: POSITIVE — AB
Leukocytes, UA: NEGATIVE
Nitrite, UA: NEGATIVE
Protein, UA: NEGATIVE
Spec Grav, UA: 1.015 (ref 1.010–1.025)
Urobilinogen, UA: NEGATIVE E.U./dL — AB
pH, UA: 6 (ref 5.0–8.0)

## 2023-03-25 LAB — VITAMIN D 25 HYDROXY (VIT D DEFICIENCY, FRACTURES): VITD: 30.74 ng/mL (ref 30.00–100.00)

## 2023-03-25 LAB — GLUCOSE, POCT (MANUAL RESULT ENTRY): POC Glucose: 391 mg/dl — AB (ref 70–99)

## 2023-03-25 LAB — POCT GLYCOSYLATED HEMOGLOBIN (HGB A1C): Hemoglobin A1C: 11.4 % — AB (ref 4.0–5.6)

## 2023-03-25 MED ORDER — CYCLOBENZAPRINE HCL 5 MG PO TABS: 5.0000 mg | ORAL_TABLET | Freq: Three times a day (TID) | ORAL | 0 refills | Status: DC | PRN

## 2023-03-25 MED ORDER — FUROSEMIDE 40 MG PO TABS
20.0000 mg | ORAL_TABLET | Freq: Every day | ORAL | 3 refills | Status: DC
Start: 2023-03-25 — End: 2023-07-29

## 2023-03-25 MED ORDER — METFORMIN HCL ER 750 MG PO TB24
750.0000 mg | ORAL_TABLET | Freq: Two times a day (BID) | ORAL | 3 refills | Status: DC
Start: 2023-03-25 — End: 2023-09-06

## 2023-03-25 MED ORDER — POTASSIUM CHLORIDE CRYS ER 20 MEQ PO TBCR
EXTENDED_RELEASE_TABLET | ORAL | 3 refills | Status: DC
Start: 2023-03-25 — End: 2023-12-09

## 2023-03-25 NOTE — Progress Notes (Signed)
Established Patient Office Visit   Subjective  Patient ID: Renee Watts, female    DOB: 1950/10/01  Age: 72 y.o. MRN: [id number]  Chief Complaint  Patient presents with   Annitta Needs across hall and both knees buckled. Landed on behind. Left knee is more painful than right. Right elbow took a lot of fall, left shoulder is sore. Used heat off and on and ice off and on. Alternate advil and ibuprofin.   Pt accompanied by her eldest daughter.  Patient is a 72 year old female seen for acute concern.  Patient endorses fall from standing 2 days ago while standing in kitchen.  Knees buckled and patient fell backwards landing on her butt hitting her right elbow.  Patient had 2 family members help her up off the floor.  Patient notes left shoulder pain, bilateral knee pain left greater than right.  Alternated Tylenol and ibuprofen, heat, and ice.  Has been using walker since fall.  Patient's daughter mentions patient experiencing frequent urination and urgency times several weeks.  Pt having accidents due to being unable to get to the bathroom in time.  Patient mentions increased intake of fruits including watermelons, cantaloupe, cherries, and berries.  States has not really been checking blood sugar, but last reading was 530.  Patient previously well-controlled on metformin XR 750 mg daily.  Patient also mentions being out of several medications including Lasix and K. Dur for at least a month.  Patient did not request refills.  Patient inquires about vitamin D and calcium levels.  Completed ergocalciferol weekly x 12 weeks.   Past Medical History:  Diagnosis Date   Allergy    Benign paroxysmal positional vertigo 06/25/2013   dx with prior PCP, s/p labs, mri and vestibular rehab; nasal spray for etd seemed to help   Blood in stool 03/11/2012   Dependent edema    Bilateral   Depression    GERD (gastroesophageal reflux disease)    on meds   Hyperlipidemia    on meds   Low back pain     sciatica; sees orthopedic specialist ([facility])for this and shoulder tendinitis   Obesity, diabetes, and hypertension syndrome (HCC)    on med   Osteoarthritis, knee    Bilateral, s/p 3 total knee replacement surgerues on right, last 2005. Dr. Shelle Iron   Pellegrini-Steida syndrome    Myositis ossificans   Seasonal allergies    Shingles 07/10/2012   LESIONS CLEARED BUT NOT TOLERATING ANY THING TIGHT AGAINST BODY--SHINGLES WERE AROSS BACK AND ABDOMEN   Past Surgical History:  Procedure Laterality Date   ABDOMINAL HYSTERECTOMY  1984   BLADDER SURGERY  03/2017   bladder tac per pt   COLONOSCOPY  2020   MS-MAC-goly (good)-hems/TAx5)   left shoulder steroid injection     POLYPECTOMY  2020   TA x 5   TONSILLECTOMY  1977   TOTAL HIP ARTHROPLASTY Right 10/10/2012   Procedure: R TOTAL HIP ARTHROPLASTY ANTERIOR APPROACH;  Surgeon: Kathryne Hitch, MD;  Location: [facility];  Service: Orthopedics;  Laterality: Right;  Right Total Hip Arthroplasty, Anterior Approach (C-Arm)   TOTAL KNEE ARTHROPLASTY Right    with multiple revisions, 2003, 2005   TOTAL KNEE ARTHROPLASTY Left 01/09/2013   Procedure: LEFT TOTAL KNEE ARTHROPLASTY;  Surgeon: Kathryne Hitch, MD;  Location: [facility];  Service: Orthopedics;  Laterality: Left;   TUBAL LIGATION  1978   WISDOM TOOTH EXTRACTION  2013,2014   Social History   Tobacco Use  Smoking status: Never   Smokeless tobacco: Never  Vaping Use   Vaping status: Never Used  Substance Use Topics   Alcohol use: No   Drug use: No   Family History  Problem Relation Age of Onset   Diabetes Mother    Hypertension Mother    Hypertension Father    Cancer Father        Lung CA   Coronary artery disease Father    Colon cancer Neg Hx    Colon polyps Neg Hx    Esophageal cancer Neg Hx    Rectal cancer Neg Hx    Stomach cancer Neg Hx    No Known Allergies    ROS Negative unless stated above    Objective:     BP 132/68 (BP Location: Right  Arm, Patient Position: Sitting, Cuff Size: Normal)   Pulse 67   Temp 98.1 F (36.7 C) (Oral)   Wt 161 lb 3.2 oz (73.1 kg)   SpO2 98%   BMI 26.42 kg/m    Physical Exam Constitutional:      General: She is not in acute distress.    Appearance: Normal appearance.  HENT:     Head: Normocephalic and atraumatic.     Nose: Nose normal.     Mouth/Throat:     Mouth: Mucous membranes are moist.  Cardiovascular:     Rate and Rhythm: Normal rate and regular rhythm.     Heart sounds: Normal heart sounds. No murmur heard.    No gallop.  Pulmonary:     Effort: Pulmonary effort is normal. No respiratory distress.     Breath sounds: Normal breath sounds. No wheezing, rhonchi or rales.  Musculoskeletal:     Left knee: Swelling present. Tenderness present.     Comments: Well-healed midline surgical incisions of bilateral knees.  Skin:    General: Skin is warm and dry.  Neurological:     Mental Status: She is alert and oriented to person, place, and time.     Comments: Ambulating with walker.     Results for orders placed or performed in visit on 03/25/23  POCT urinalysis dipstick  Result Value Ref Range   Color, UA yellow    Clarity, UA clear    Glucose, UA Positive (A) Negative   Bilirubin, UA neg    Ketones, UA +    Spec Grav, UA 1.015 1.010 - 1.025   Blood, UA neg    pH, UA 6.0 5.0 - 8.0   Protein, UA Negative Negative   Urobilinogen, UA negative (A) 0.2 or 1.0 E.U./dL   Nitrite, UA neg    Leukocytes, UA Negative Negative   Appearance     Odor    POC HgB A1c  Result Value Ref Range   Hemoglobin A1C 11.4 (A) 4.0 - 5.6 %   HbA1c POC (<> result, manual entry)     HbA1c, POC (prediabetic range)     HbA1c, POC (controlled diabetic range)    POC Glucose (CBG)  Result Value Ref Range   POC Glucose 391 (A) 70 - 99 mg/dl      Assessment & Plan:  Hyperglycemia -Blood sugar 391 in clinic -POC Hemoglobin A1c 11.4%.  Previously 6.5% 4 months ago. -Discussed the importance of  lifestyle modifications -Will increase metformin XL from 750 daily to 750 twice daily.  Will likely need further adjustments.  Patient encouraged to check blood sugar more frequently and keep a log to bring with her to clinic -Will have  patient follow-up in 4 weeks, sooner if needed -     POCT glycosylated hemoglobin (Hb A1C) -     POCT glucose (manual entry) -     Comprehensive metabolic panel  Type II diabetes mellitus with neurological manifestations (HCC) -     POCT glycosylated hemoglobin (Hb A1C) -     POCT glucose (manual entry) -     metFORMIN HCl ER; Take 1 tablet (750 mg total) by mouth 2 (two) times daily with a meal.  Dispense: 180 tablet; Refill: 3 -     Comprehensive metabolic panel  Fall from standing, initial encounter -     DG Knee Complete 4 Views Right -     DG Knee Complete 4 Views Left -     CBC with Differential/Platelet  Urinary frequency -2/2 hyperglycemia from uncontrolled DM 2 -     POCT urinalysis dipstick -     CBC with Differential/Platelet  Acute pain of both knees -     DG Knee Complete 4 Views Right -     DG Knee Complete 4 Views Left -     Cyclobenzaprine HCl; Take 1 tablet (5 mg total) by mouth 3 (three) times daily as needed for muscle spasms.  Dispense: 30 tablet; Refill: 0 -     CBC with Differential/Platelet  Status post total bilateral knee replacement -     DG Knee Complete 4 Views Right -     DG Knee Complete 4 Views Left  Tendinitis of long head of biceps brachii of left shoulder -supportive care including heat, stretching, topical analgesics, Tylenol, NSAIDs sparingly. -     Cyclobenzaprine HCl; Take 1 tablet (5 mg total) by mouth 3 (three) times daily as needed for muscle spasms.  Dispense: 30 tablet; Refill: 0 -     CBC with Differential/Platelet  Vitamin D deficiency -     Comprehensive metabolic panel -     VITAMIN D 25 Hydroxy (Vit-D Deficiency, Fractures)  Essential hypertension -BP controlled -Continue current  medications -Lifestyle modifications -     Furosemide; Take 0.5 tablets (20 mg total) by mouth daily.  Dispense: 90 tablet; Refill: 3  Edema of right ankle -     Furosemide; Take 0.5 tablets (20 mg total) by mouth daily.  Dispense: 90 tablet; Refill: 3 -     Potassium Chloride Crys ER; TAKE 1 TABLET(20 MEQ) BY MOUTH DAILY  Dispense: 90 tablet; Refill: 3    Return in about 1 month (around 04/25/2023).   Deeann Saint, MD

## 2023-03-25 NOTE — Patient Instructions (Addendum)
Hemoglobin A1c this visit was 11.3%.    As previously mentioned in clinic your current urinary symptoms are due to your blood sugar being elevated.  Given this we are increasing your metformin XR 750 mg from once daily to  one pill with breakfast and 1 pill with dinner.  Once your blood sugars get back under control we can look at adjusting the dose.  Continue to monitor your blood sugars at home and keep a log of the readings to bring to your next appointment in 1 month.  Orders for x-rays were placed for your knee.  The radiology department has been experiencing a bit of delay in reading some images so it may take a little longer than expected to get the results.  In the meantime a prescription for Flexeril was sent to your pharmacy to help with the muscle spasms.  Remember this can cause drowsiness so you may need to take the medication at night to see how it makes you feel.

## 2023-03-27 ENCOUNTER — Encounter (INDEPENDENT_AMBULATORY_CARE_PROVIDER_SITE_OTHER): Payer: Self-pay

## 2023-04-10 ENCOUNTER — Ambulatory Visit: Payer: PPO | Admitting: Family Medicine

## 2023-04-25 ENCOUNTER — Encounter: Payer: Self-pay | Admitting: Family Medicine

## 2023-04-25 ENCOUNTER — Ambulatory Visit (INDEPENDENT_AMBULATORY_CARE_PROVIDER_SITE_OTHER): Payer: PPO | Admitting: Family Medicine

## 2023-04-25 VITALS — BP 118/62 | HR 62 | Temp 98.6°F | Ht 65.5 in | Wt 158.8 lb

## 2023-04-25 DIAGNOSIS — R202 Paresthesia of skin: Secondary | ICD-10-CM | POA: Diagnosis not present

## 2023-04-25 DIAGNOSIS — Z96652 Presence of left artificial knee joint: Secondary | ICD-10-CM | POA: Diagnosis not present

## 2023-04-25 DIAGNOSIS — E1149 Type 2 diabetes mellitus with other diabetic neurological complication: Secondary | ICD-10-CM

## 2023-04-25 DIAGNOSIS — M1990 Unspecified osteoarthritis, unspecified site: Secondary | ICD-10-CM | POA: Diagnosis not present

## 2023-04-25 DIAGNOSIS — R600 Localized edema: Secondary | ICD-10-CM | POA: Diagnosis not present

## 2023-04-25 DIAGNOSIS — Z7984 Long term (current) use of oral hypoglycemic drugs: Secondary | ICD-10-CM | POA: Diagnosis not present

## 2023-04-25 DIAGNOSIS — H538 Other visual disturbances: Secondary | ICD-10-CM

## 2023-04-25 LAB — POCT GLYCOSYLATED HEMOGLOBIN (HGB A1C): Hemoglobin A1C: 9.9 % — AB (ref 4.0–5.6)

## 2023-04-25 MED ORDER — ROLLATOR ULTRA-LIGHT MISC
1.0000 | 0 refills | Status: AC
Start: 2023-04-25 — End: ?

## 2023-04-25 NOTE — Progress Notes (Signed)
Established Patient Office Visit   Subjective  Patient ID: Renee Watts, female    DOB: [birth year]  Age: 72 y.o. MRN: [id number]  Chief Complaint  Patient presents with   Medical Management of Chronic Issues    Diabetes   Pt accompanied by her daughter.  Patient is a 72 year old female seen for follow-up.  Having blurred vision x 1 month.  Had  diabetic retinopathy screening 02/14/2023.  Notes working to control blood sugar.  A1c 11.4% on 03/25/2023.  Patient previously well-controlled but notes eating more of the food she typically avoids.  Having intermittent edema and paresthesias in bilateral LEs.  Patient's daughter insistent on getting patient a prescription for rollator.  Patient is not interested in this.    Past Medical History:  Diagnosis Date   Allergy    Benign paroxysmal positional vertigo 06/25/2013   dx with prior PCP, s/p labs, mri and vestibular rehab; nasal spray for etd seemed to help   Blood in stool 03/11/2012   Dependent edema    Bilateral   Depression    GERD (gastroesophageal reflux disease)    on meds   Hyperlipidemia    on meds   Low back pain    sciatica; sees orthopedic specialist ([facility])for this and shoulder tendinitis   Obesity, diabetes, and hypertension syndrome (HCC)    on med   Osteoarthritis, knee    Bilateral, s/p 3 total knee replacement surgerues on right, last 2005. Dr. Shelle Iron   Pellegrini-Steida syndrome    Myositis ossificans   Seasonal allergies    Shingles 07/10/2012   LESIONS CLEARED BUT NOT TOLERATING ANY THING TIGHT AGAINST BODY--SHINGLES WERE AROSS BACK AND ABDOMEN   Past Surgical History:  Procedure Laterality Date   ABDOMINAL HYSTERECTOMY  1984   BLADDER SURGERY  03/2017   bladder tac per pt   COLONOSCOPY  2020   MS-MAC-goly (good)-hems/TAx5)   left shoulder steroid injection     POLYPECTOMY  2020   TA x 5   TONSILLECTOMY  1977   TOTAL HIP ARTHROPLASTY Right 10/10/2012   Procedure: R TOTAL HIP  ARTHROPLASTY ANTERIOR APPROACH;  Surgeon: Kathryne Hitch, MD;  Location: [facility];  Service: Orthopedics;  Laterality: Right;  Right Total Hip Arthroplasty, Anterior Approach (C-Arm)   TOTAL KNEE ARTHROPLASTY Right    with multiple revisions, 2003, 2005   TOTAL KNEE ARTHROPLASTY Left 01/09/2013   Procedure: LEFT TOTAL KNEE ARTHROPLASTY;  Surgeon: Kathryne Hitch, MD;  Location: [facility];  Service: Orthopedics;  Laterality: Left;   TUBAL LIGATION  1978   WISDOM TOOTH EXTRACTION  2013,2014   Social History   Tobacco Use   Smoking status: Never   Smokeless tobacco: Never  Vaping Use   Vaping status: Never Used  Substance Use Topics   Alcohol use: No   Drug use: No   Family History  Problem Relation Age of Onset   Diabetes Mother    Hypertension Mother    Hypertension Father    Cancer Father        Lung CA   Coronary artery disease Father    Colon cancer Neg Hx    Colon polyps Neg Hx    Esophageal cancer Neg Hx    Rectal cancer Neg Hx    Stomach cancer Neg Hx    No Known Allergies    ROS Negative unless stated above    Objective:     BP 118/62 (BP Location: Left Arm, Patient Position: Sitting, Cuff Size: Normal)  Pulse 62   Temp 98.6 F (37 C) (Oral)   Ht 5' 5.5" (1.664 m)   Wt 158 lb 12.8 oz (72 kg)   SpO2 97%   BMI 26.02 kg/m  BP Readings from Last 3 Encounters:  04/25/23 118/62  03/25/23 132/68  12/20/22 (!) 132/57   Wt Readings from Last 3 Encounters:  04/25/23 158 lb 12.8 oz (72 kg)  03/25/23 161 lb 3.2 oz (73.1 kg)  03/18/23 164 lb (74.4 kg)      Physical Exam Constitutional:      General: She is not in acute distress.    Appearance: Normal appearance.  HENT:     Head: Normocephalic and atraumatic.     Nose: Nose normal.     Mouth/Throat:     Mouth: Mucous membranes are moist.  Cardiovascular:     Rate and Rhythm: Normal rate and regular rhythm.     Heart sounds: Normal heart sounds. No murmur heard.    No gallop.  Pulmonary:      Effort: Pulmonary effort is normal. No respiratory distress.     Breath sounds: Normal breath sounds. No wheezing, rhonchi or rales.  Skin:    General: Skin is warm and dry.  Neurological:     Mental Status: She is alert and oriented to person, place, and time.     Diabetic Foot Exam - Simple   Simple Foot Form Diabetic Foot exam was performed with the following findings: Yes 04/25/2023 10:52 AM  Visual Inspection No deformities, no ulcerations, no other skin breakdown bilaterally: Yes Sensation Testing Intact to touch and monofilament testing bilaterally: Yes Pulse Check Posterior Tibialis and Dorsalis pulse intact bilaterally: Yes Comments     Results for orders placed or performed in visit on 04/25/23  POC HgB A1c  Result Value Ref Range   Hemoglobin A1C 9.9 (A) 4.0 - 5.6 %   HbA1c POC (<> result, manual entry)     HbA1c, POC (prediabetic range)     HbA1c, POC (controlled diabetic range)        Assessment & Plan:  Type II diabetes mellitus with neurological manifestations (HCC) -Hemoglobin A1c 9.9% this visit -Hemoglobin A1c previously 11.4% on 03/25/2023. -Continue working on lifestyle modifications -Continue current medications including metformin XR 750 mg -Diabetic retinopathy screening done 02/14/2023 -Continue ACE-I -Patient declines referral to podiatry. -     POCT glycosylated hemoglobin (Hb A1C) -     For Home Use Only DME Diabetic Shoe  Paresthesia -Glycemic control encouraged  Bilateral lower extremity edema -discussed likely 2/2 dependent edema, and sodium intake -Discussed the importance of diet complications -Compression socks or TED hose, elevation -     Rollator Ultra-Light; 1 Device by Does not apply route as directed.  Dispense: 1 each; Refill: 0  Osteoarthritis, unspecified osteoarthritis type, unspecified site -     Rollator Ultra-Light; 1 Device by Does not apply route as directed.  Dispense: 1 each; Refill: 0  S/P TKR (total knee  replacement), left -     Rollator Ultra-Light; 1 Device by Does not apply route as directed.  Dispense: 1 each; Refill: 0  Blurred vision -Advised likely 2/2 elevated blood sugar versus other cause such as cataract.  Pt continue to monitor to see if symptoms improve with glycemic control.  If not pt to schedule eye exam in the next month.  Return in about 3 months (around 07/26/2023).   Deeann Saint, MD

## 2023-04-25 NOTE — Patient Instructions (Addendum)
Your A1c is now 9.9%.  It was 11.4% on 03/25/2023.  Continue working on diet changes.  Your current medications.  A prescription for diabetic shoes was printed.  Consider having this filled.   Do not forget to call and set up an eye exam.  Though your blood sugar is continuing to decrease you still may note some residual blurred vision from the elevated blood sugar.

## 2023-04-30 DIAGNOSIS — G8929 Other chronic pain: Secondary | ICD-10-CM | POA: Diagnosis not present

## 2023-04-30 DIAGNOSIS — E663 Overweight: Secondary | ICD-10-CM | POA: Diagnosis not present

## 2023-04-30 DIAGNOSIS — E1165 Type 2 diabetes mellitus with hyperglycemia: Secondary | ICD-10-CM | POA: Diagnosis not present

## 2023-04-30 DIAGNOSIS — K219 Gastro-esophageal reflux disease without esophagitis: Secondary | ICD-10-CM | POA: Diagnosis not present

## 2023-04-30 DIAGNOSIS — G629 Polyneuropathy, unspecified: Secondary | ICD-10-CM | POA: Diagnosis not present

## 2023-04-30 DIAGNOSIS — J309 Allergic rhinitis, unspecified: Secondary | ICD-10-CM | POA: Diagnosis not present

## 2023-04-30 DIAGNOSIS — E876 Hypokalemia: Secondary | ICD-10-CM | POA: Diagnosis not present

## 2023-04-30 DIAGNOSIS — D509 Iron deficiency anemia, unspecified: Secondary | ICD-10-CM | POA: Diagnosis not present

## 2023-04-30 DIAGNOSIS — E1142 Type 2 diabetes mellitus with diabetic polyneuropathy: Secondary | ICD-10-CM | POA: Diagnosis not present

## 2023-04-30 DIAGNOSIS — E1169 Type 2 diabetes mellitus with other specified complication: Secondary | ICD-10-CM | POA: Diagnosis not present

## 2023-04-30 DIAGNOSIS — E785 Hyperlipidemia, unspecified: Secondary | ICD-10-CM | POA: Diagnosis not present

## 2023-04-30 DIAGNOSIS — I10 Essential (primary) hypertension: Secondary | ICD-10-CM | POA: Diagnosis not present

## 2023-06-20 ENCOUNTER — Other Ambulatory Visit: Payer: Self-pay | Admitting: Family Medicine

## 2023-06-20 DIAGNOSIS — J302 Other seasonal allergic rhinitis: Secondary | ICD-10-CM

## 2023-07-04 ENCOUNTER — Ambulatory Visit: Payer: PPO | Admitting: Orthopedic Surgery

## 2023-07-04 ENCOUNTER — Telehealth: Payer: Self-pay | Admitting: Orthopedic Surgery

## 2023-07-04 ENCOUNTER — Other Ambulatory Visit (INDEPENDENT_AMBULATORY_CARE_PROVIDER_SITE_OTHER): Payer: PPO

## 2023-07-04 VITALS — BP 146/77 | HR 76 | Ht 65.5 in | Wt 160.0 lb

## 2023-07-04 DIAGNOSIS — G8929 Other chronic pain: Secondary | ICD-10-CM

## 2023-07-04 DIAGNOSIS — M5416 Radiculopathy, lumbar region: Secondary | ICD-10-CM

## 2023-07-04 DIAGNOSIS — M545 Low back pain, unspecified: Secondary | ICD-10-CM | POA: Diagnosis not present

## 2023-07-04 MED ORDER — DIAZEPAM 5 MG PO TABS: 5.0000 mg | ORAL_TABLET | Freq: Once | ORAL | 0 refills | Status: DC | PRN

## 2023-07-04 NOTE — Progress Notes (Addendum)
Orthopedic Spine Surgery Office Note  Assessment: Patient is a 72 y.o. female with low back pain that radiates into her bilateral buttock and her right lower extremity. She has spondylolisthesis and L3/4 and PI-LL mismatch   Plan: -Explained that initially conservative treatment is tried as a significant number of patients may experience relief with these treatment modalities. Discussed that the conservative treatments include:  -activity modification  -physical therapy  -over the counter pain medications  -medrol dosepak  -lumbar steroid injections -Patient has tried PT, tylenol, gabapentin, several lumbar steroid injections  -Since patient has tried several conservative treatments for over 2 years now without relief, recommended MRI of the lumbar spine to evaluate further -Prescribed a valium for her MRI -Would need an A1c of 7.5 or less prior to any elective spine surgery -Patient should return to office in 4 weeks, x-rays at next visit: none   Patient expressed understanding of the plan and all questions were answered to the patient's satisfaction.   ___________________________________________________________________________   History:  Patient is a 72 y.o. female who presents today for lumbar spine. Patient has had 3-4 years of low back pain that radiates into her bilateral buttocks and her right lower extremity. She fees it along the anterior thigh into the anteromedial leg. Her left leg pain does not radiate past the buttock. Has pain with activity and at rest. Generally, it is worse with activity. She feels that her pain has gotten progressively worse over time. There was no trauma or injury that preceded the onset of pain. Feels she has been walking slower with time. Initially was seen by partner Dr. Alvester Morin and has gotten injections that initially provided her with good and long-lasting relief. She said the first one lasted for over 1 year but now they are not lasting as long. She  is currently taking gabapentin and does not find that helpful. She has tried PT and tylenol in the past and did not find them helpful.    Weakness: denies Symptoms of imbalance: yes, sometimes right leg will give out on her Paresthesias and numbness: yes, gets paresthesias in her bilateral lower extremities. She feels them in her anterior thighs and anteromedial legs. She said it is generally worse at night. She does not feel them all the time Bowel or bladder incontinence: denies Saddle anesthesia: denies  Treatments tried: PT, tylenol, gabapentin, several lumbar steroid injections   Review of systems: Denies fevers and chills, night sweats, unexplained weight loss, history of cancer. Has had pain that wakes her at night   Past medical history: Depression/Anxiety GERD DM (last A1c was 9.9 on 04/25/2023) HLD  Allergies: NKDA  Past surgical history:  Right THA  Bilateral TKA Hysterectomy Polypectomy Bladder surgery  Social history: Denies use of nicotine product (smoking, vaping, patches, smokeless) Alcohol use: denies Denies recreational drug use   Physical Exam:  BMI of 26.2  General: no acute distress, appears stated age Neurologic: alert, answering questions appropriately, following commands Respiratory: unlabored breathing on room air, symmetric chest rise Psychiatric: appropriate affect, normal cadence to speech   MSK (spine):  -Strength exam      Left  Right EHL    4/5  3/5 TA    4/5  3/5 GSC    4/5  4/5 Knee extension  5/5  5/5 Hip flexion   5/5  5/5  -Sensory exam    Sensation intact to light touch in L3-S1 nerve distributions of bilateral lower extremities  -Achilles DTR: 1/4 on the left, 1/4  on the right -Patellar tendon DTR: 2/4 on the left, 1/4 on the right  -Straight leg raise: positive on the right, negative on the left -Femoral nerve stretch test: negative bilaterally -Clonus: no beats bilaterally  -Left hip exam: no pain through range  of motion, negative stinchfield, negative faber -Right hip exam: no pain through range of motion, negative stinchfield, negative faber  Imaging: XRs of the lumbar spine from 07/04/2023 was independently reviewed and interpreted, showing spondylolisthesis at L3/4 that shifts about 1mm between flexion and extension views. Disc height loss at L3/4, L4/5, L5/S1. PI of 58, LL of 38.  MRI of the lumbar spine from 06/26/2020 was independently reviewed and interpreted, showing spondylolisthesis at L3/4.  Disc bulges at L3/4 and L4/5.  Central lateral recess stenosis at L3/4.  Lateral recess stenosis bilaterally at L4/5.   Patient name: Renee Watts Patient MRN: [id number] Date of visit: 07/04/23

## 2023-07-04 NOTE — Telephone Encounter (Signed)
Pt requesting Rx for calming during MRI

## 2023-07-09 ENCOUNTER — Telehealth: Payer: Self-pay | Admitting: Orthopedic Surgery

## 2023-07-09 NOTE — Telephone Encounter (Signed)
Patient called asked if she can get something to help her relax prior to her MRI?  Patient uses [facility] on [address].  The number to contact patient is [phone]

## 2023-07-10 MED ORDER — DIAZEPAM 5 MG PO TABS: 5.0000 mg | ORAL_TABLET | Freq: Once | ORAL | 0 refills | Status: DC | PRN

## 2023-07-10 NOTE — Addendum Note (Signed)
Addended by: Willia Craze on: 07/10/2023 09:15 AM   Modules accepted: Orders

## 2023-07-16 ENCOUNTER — Other Ambulatory Visit: Payer: Self-pay | Admitting: Family Medicine

## 2023-07-16 DIAGNOSIS — E1142 Type 2 diabetes mellitus with diabetic polyneuropathy: Secondary | ICD-10-CM

## 2023-07-16 DIAGNOSIS — M79651 Pain in right thigh: Secondary | ICD-10-CM

## 2023-07-27 ENCOUNTER — Ambulatory Visit
Admission: RE | Admit: 2023-07-27 | Discharge: 2023-07-27 | Disposition: A | Discharge status: Home / Self Care | Payer: PPO | Source: Ambulatory Visit | Attending: Orthopedic Surgery | Admitting: Orthopedic Surgery

## 2023-07-27 DIAGNOSIS — M5416 Radiculopathy, lumbar region: Secondary | ICD-10-CM

## 2023-07-27 DIAGNOSIS — M48061 Spinal stenosis, lumbar region without neurogenic claudication: Secondary | ICD-10-CM | POA: Diagnosis not present

## 2023-07-27 DIAGNOSIS — M47816 Spondylosis without myelopathy or radiculopathy, lumbar region: Secondary | ICD-10-CM | POA: Diagnosis not present

## 2023-07-29 ENCOUNTER — Encounter: Payer: Self-pay | Admitting: Family Medicine

## 2023-07-29 ENCOUNTER — Ambulatory Visit (INDEPENDENT_AMBULATORY_CARE_PROVIDER_SITE_OTHER): Payer: PPO | Admitting: Family Medicine

## 2023-07-29 VITALS — BP 138/72 | HR 65 | Temp 98.5°F | Ht 65.5 in | Wt 156.2 lb

## 2023-07-29 DIAGNOSIS — M48062 Spinal stenosis, lumbar region with neurogenic claudication: Secondary | ICD-10-CM

## 2023-07-29 DIAGNOSIS — Z7984 Long term (current) use of oral hypoglycemic drugs: Secondary | ICD-10-CM | POA: Diagnosis not present

## 2023-07-29 DIAGNOSIS — Z23 Encounter for immunization: Secondary | ICD-10-CM | POA: Diagnosis not present

## 2023-07-29 DIAGNOSIS — E1149 Type 2 diabetes mellitus with other diabetic neurological complication: Secondary | ICD-10-CM | POA: Diagnosis not present

## 2023-07-29 DIAGNOSIS — I10 Essential (primary) hypertension: Secondary | ICD-10-CM

## 2023-07-29 DIAGNOSIS — M25471 Effusion, right ankle: Secondary | ICD-10-CM | POA: Diagnosis not present

## 2023-07-29 DIAGNOSIS — M5416 Radiculopathy, lumbar region: Secondary | ICD-10-CM

## 2023-07-29 LAB — POCT GLYCOSYLATED HEMOGLOBIN (HGB A1C): Hemoglobin A1C: 5.9 % — AB (ref 4.0–5.6)

## 2023-07-29 MED ORDER — FUROSEMIDE 40 MG PO TABS: 20.0000 mg | ORAL_TABLET | Freq: Every day | ORAL | 3 refills | Status: DC

## 2023-07-29 NOTE — Progress Notes (Signed)
Established Patient Office Visit   Subjective  Patient ID: Renee Watts, female    DOB: 1950-10-27  Age: 72 y.o. MRN: [id number]  Chief Complaint  Patient presents with   Medical Management of Chronic Issues   Back Pain    Patient went for MRI 2 days ago     Pt is a 72 yo female seen for f/u.  Pt working on eating diet.  BS 102-135 at home.  Pt notes decreased appetite.  Eating because she knows she needs to.  Typically eats lunch,, but small portion and snacks.  Drinking 1-1/2 bottles of Diet Coke daily.    Patient had MRI on Saturday for low back pain with pain into legs.  Patient having to pick up her right leg to move it around.  Also notes pain in the sides of back.  Currently sleeping in recliner is unable to turn over in bed at night.  Patient considering surgery for back pain.  Has an appointment December 18 with Ortho to review MRI results.  Patient requesting refill on Lasix.  Would like flu vaccine.  Plans to get shingles and RSV at pharmacy.    Patient Active Problem List   Diagnosis Date Noted   Osteopenia 02/15/2021   Venous stasis of both lower extremities 06/27/2020   Bilateral lower extremity edema 02/05/2020   Recurrent sinusitis 09/03/2018   Vaginal vault prolapse after hysterectomy 02/13/2017   Obesity 01/06/2016   Low back pain 01/06/2016   Diabetic peripheral neuropathy (HCC) 09/01/2012   Hyperlipemia 09/16/2006   Essential hypertension 09/16/2006   GERD 09/16/2006   Type II diabetes mellitus with neurological manifestations (HCC) 06/17/2006   Osteoarthritis 06/17/2006   DEPENDENT EDEMA, LEGS, BILATERAL 06/17/2006   Past Medical History:  Diagnosis Date   Allergy    Benign paroxysmal positional vertigo 06/25/2013   dx with prior PCP, s/p labs, mri and vestibular rehab; nasal spray for etd seemed to help   Blood in stool 03/11/2012   Dependent edema    Bilateral   Depression    GERD (gastroesophageal reflux disease)    on meds    Hyperlipidemia    on meds   Low back pain    sciatica; sees orthopedic specialist ([facility])for this and shoulder tendinitis   Obesity, diabetes, and hypertension syndrome (HCC)    on med   Osteoarthritis, knee    Bilateral, s/p 3 total knee replacement surgerues on right, last 2005. Dr. Shelle Iron   Pellegrini-Steida syndrome    Myositis ossificans   Seasonal allergies    Shingles 07/10/2012   LESIONS CLEARED BUT NOT TOLERATING ANY THING TIGHT AGAINST BODY--SHINGLES WERE AROSS BACK AND ABDOMEN   Past Surgical History:  Procedure Laterality Date   ABDOMINAL HYSTERECTOMY  1984   BLADDER SURGERY  03/2017   bladder tac per pt   COLONOSCOPY  2020   MS-MAC-goly (good)-hems/TAx5)   left shoulder steroid injection     POLYPECTOMY  2020   TA x 5   TONSILLECTOMY  1977   TOTAL HIP ARTHROPLASTY Right 10/10/2012   Procedure: R TOTAL HIP ARTHROPLASTY ANTERIOR APPROACH;  Surgeon: Kathryne Hitch, MD;  Location: [facility];  Service: Orthopedics;  Laterality: Right;  Right Total Hip Arthroplasty, Anterior Approach (C-Arm)   TOTAL KNEE ARTHROPLASTY Right    with multiple revisions, 2003, 2005   TOTAL KNEE ARTHROPLASTY Left 01/09/2013   Procedure: LEFT TOTAL KNEE ARTHROPLASTY;  Surgeon: Kathryne Hitch, MD;  Location: [facility];  Service: Orthopedics;  Laterality:  Left;   TUBAL LIGATION  1978   WISDOM TOOTH EXTRACTION  2013,2014   Social History   Tobacco Use   Smoking status: Never   Smokeless tobacco: Never  Vaping Use   Vaping status: Never Used  Substance Use Topics   Alcohol use: No   Drug use: No   Family History  Problem Relation Age of Onset   Diabetes Mother    Hypertension Mother    Hypertension Father    Cancer Father        Lung CA   Coronary artery disease Father    Colon cancer Neg Hx    Colon polyps Neg Hx    Esophageal cancer Neg Hx    Rectal cancer Neg Hx    Stomach cancer Neg Hx    No Known Allergies    ROS Negative unless stated above     Objective:     BP 138/72 (BP Location: Left Arm, Patient Position: Sitting, Cuff Size: Large)   Pulse 65   Temp 98.5 F (36.9 C) (Oral)   Ht 5' 5.5" (1.664 m)   Wt 156 lb 3.2 oz (70.9 kg)   SpO2 95%   BMI 25.60 kg/m  BP Readings from Last 3 Encounters:  07/29/23 138/72  07/04/23 (!) 146/77  04/25/23 118/62   Wt Readings from Last 3 Encounters:  07/29/23 156 lb 3.2 oz (70.9 kg)  07/04/23 160 lb (72.6 kg)  04/25/23 158 lb 12.8 oz (72 kg)      Physical Exam Constitutional:      General: She is not in acute distress.    Appearance: Normal appearance.  HENT:     Head: Normocephalic and atraumatic.     Nose: Nose normal.     Mouth/Throat:     Mouth: Mucous membranes are moist.  Eyes:     Extraocular Movements: Extraocular movements intact.     Conjunctiva/sclera: Conjunctivae normal.  Cardiovascular:     Rate and Rhythm: Normal rate and regular rhythm.     Heart sounds: Normal heart sounds. No murmur heard.    No gallop.  Pulmonary:     Effort: Pulmonary effort is normal. No respiratory distress.     Breath sounds: Normal breath sounds. No wheezing, rhonchi or rales.  Skin:    General: Skin is warm and dry.  Neurological:     Mental Status: She is alert and oriented to person, place, and time. Mental status is at baseline.     Cranial Nerves: Cranial nerves 2-12 are intact.     Comments: Bilateral LE weakness.      Results for orders placed or performed in visit on 07/29/23  POC HgB A1c  Result Value Ref Range   Hemoglobin A1C 5.9 (A) 4.0 - 5.6 %   HbA1c POC (<> result, manual entry)     HbA1c, POC (prediabetic range)     HbA1c, POC (controlled diabetic range)        Assessment & Plan:  Type II diabetes mellitus with neurological manifestations (HCC) -Hemoglobin A1c 9.9% on 04/25/2023 -Hemoglobin A 1C this visit 5.9% -Continue metformin XR 750 mg twice daily with meals -Continue lifestyle modifications -Foot exam done 04/25/2023 -Consider  statin -Continue gabapentin -     POCT glycosylated hemoglobin (Hb A1C)  Need for influenza vaccination -     Flu Vaccine Trivalent High Dose (Fluad)  Essential hypertension -Controlled -Continue current medication lisinopril 20 mg daily, -Continue lifestyle modifications  Lumbar radiculopathy  Spinal stenosis of lumbar region with  neurogenic claudication -Lumbar MRI done 07/27/2023-read pending.  Per review appears spinal stenosis present L3-L4.  Prior MRI of spine lumbar done 06/26/2020. -Encouraged to keep follow-up with Ortho on 08/14/2023.  If needed consider second opinion -Continue tramadol as needed -Continue gabapentin  Edema of right ankle -Patient requesting refills though appears had refills.  Med refill evaluated.         -     Lasix 40 mg take half tab by mouth daily  No follow-ups on file.   Deeann Saint, MD

## 2023-07-29 NOTE — Patient Instructions (Signed)
Follow up in 3-4 months, sooner if needed

## 2023-08-14 ENCOUNTER — Ambulatory Visit: Payer: PPO | Admitting: Orthopedic Surgery

## 2023-08-14 DIAGNOSIS — M810 Age-related osteoporosis without current pathological fracture: Secondary | ICD-10-CM | POA: Diagnosis not present

## 2023-08-14 NOTE — Progress Notes (Addendum)
 Orthopedic Spine Surgery Office Note   Assessment: Patient is a 72 y.o. female with low back pain that radiates into her bilateral anterior thighs and anteromedial legs. Right side is significantly more effected. Has stenosis at L3/4 with a spondylolisthesis causing radiculopathy.      Plan: -Patient has tried PT, tylenol, gabapentin, several lumbar steroid injections  -She has tried several conservative treatments over the last 2 years without any lasting relief so discussed surgery as a treatment option for her. She wanted to proceed with this after our discussion.  -Her last A1c was 5.9 on 07/29/2023 -Patient will next be seen at date of surgery     The patient has symptoms consistent with lumbar stenosis. The patient's symptoms were not getting improvement with conservative treatment so operative management was discussed in the form of L3 and L4 segment laminectomies and partial medial facetectomies with posterior instrumented spinal fusion. I went over decompression alone as an option as well. I explained that there is some evidence that adding a fusion decreases low back pain functional scores post-operatively even in people with stable spondylolisthesis. Given her significant low back pain, she elected to proceed with fusion. The risks including but not limited to dural tear, nerve root injury, paralysis, persistent pain, pseudarthrosis, infection, bleeding, hardware failure, adjacent segment disease, heart attack, death, stroke, fracture, blindness, and need for additional procedures were discussed with the patient. The benefit of the surgery would be improvement in the patient's radiating leg pain. I explained that back pain relief is not the goal of the surgery and it is not reliably alleviated with this surgery. The alternatives to surgical management were covered with the patient and included continued monitoring, physical therapy, over-the-counter pain medications, ambulatory aids,  injections, and activity modification. All the patient's questions were answered to her satisfaction. After this discussion, the patient expressed understanding and elected to proceed with surgical intervention.      ___________________________________________________________________________     History:   Patient is a 72 y.o. female who presents today for follow up on her lumbar spine.  Patient states that since she has been having low back pain that radiates into her bilateral lower extremities.  She feels it goes along the anterior aspect of the thigh into the anteromedial leg.  Pain is much more significant on the right side.  She rates that pain as a 10 out of 10.  She also has pain that radiates into the left lower extremity which she rates as a 5 out of 10.  It follows the same distribution in the left lower extremity.  She also has significant low back pain.  She says that pain has gotten worse since she was last seen in the office, but she has not developed any new symptoms.  Pain is felt with activity and at rest.  She will note that she has trouble sleeping and will wake up at night because of the pain.  She has difficulty even doing tasks like cooking because of the pain.    Treatments tried: PT, tylenol, gabapentin, several lumbar steroid injections      Physical Exam:   General: no acute distress, appears stated age Neurologic: alert, answering questions appropriately, following commands Respiratory: unlabored breathing on room air, symmetric chest rise Psychiatric: appropriate affect, normal cadence to speech     MSK (spine):   -Strength exam  Left                  Right EHL                              4/5                  3/5 TA                                 4/5                  3/5 GSC                             4/5                  4/5 Knee extension            5/5                  5/5 Hip flexion                     5/5                  5/5   -Sensory exam                           Sensation intact to light touch in L2-S1 nerve distributions of bilateral lower extremities   -Achilles DTR: 1/4 on the left, 1/4 on the right -Patellar tendon DTR: 2/4 on the left, 2/4 on the right   -Straight leg raise: negative bilaterally -Femoral nerve stretch test: negative bilaterally -Clonus: no beats bilaterally   -Left hip exam: no pain through range of motion, negative stinchfield, negative faber -Right hip exam: no pain through range of motion, negative stinchfield, negative faber   Imaging: XRs of the lumbar spine from 07/04/2023 was previously independently reviewed and interpreted, showing spondylolisthesis at L3/4 that shifts about 1mm between flexion and extension views. Disc height loss at L3/4, L4/5, L5/S1. PI of 58, LL of 38.   MRI of the lumbar spine from 07/27/2023  was independently reviewed and interpreted, showing spondylolisthesis at L3/4.  Disc bulges at L3/4 and L4/5.  Central and lateral recess stenosis with arthritic facets at L3/4. Right paracentral disc herniation at L5/S1.    DEXA scan from 02/13/2021 showed a T score of -1.4   Patient name: Renee Watts Patient MRN: [id number] Date of visit: 08/14/23   Pre-operative Scores  ODI: 66% VAS leg: 10 VAS back: 10

## 2023-09-02 DIAGNOSIS — Z0279 Encounter for issue of other medical certificate: Secondary | ICD-10-CM

## 2023-09-03 ENCOUNTER — Telehealth: Payer: Self-pay

## 2023-09-03 NOTE — Telephone Encounter (Signed)
 [facility] Pre-op paperwork has been faxed and send to scan in patient's chart

## 2023-09-04 ENCOUNTER — Telehealth: Payer: Self-pay

## 2023-09-04 DIAGNOSIS — M545 Low back pain, unspecified: Secondary | ICD-10-CM

## 2023-09-04 NOTE — Telephone Encounter (Signed)
 Copied from CRM [id number]. Topic: Clinical - Prescription Issue >> Sep 04, 2023  2:04 PM Evie B wrote: Reason for CRM: pt called regarding prescription for Tramadol , states provider must fill out a form . States the insurance wants to know why is tramadol  a ongoing prescription. Please call pt back at [phone]

## 2023-09-05 ENCOUNTER — Other Ambulatory Visit (HOSPITAL_COMMUNITY): Payer: Self-pay

## 2023-09-05 ENCOUNTER — Other Ambulatory Visit: Payer: Self-pay | Admitting: Family Medicine

## 2023-09-05 DIAGNOSIS — E1149 Type 2 diabetes mellitus with other diabetic neurological complication: Secondary | ICD-10-CM

## 2023-09-05 NOTE — Telephone Encounter (Signed)
 Pharmacy Patient Advocate Encounter   Received notification from Pt Calls Messages that prior authorization for Tramadol  50mg  is required/requested.   Insurance verification completed.   The patient is insured through [facility] [employer]/RX ADVANCE .   Per test claim: The current 30 day co-pay is, $0.00.  No PA needed at this time. This test claim was processed through [facility]- copay amounts may vary at other pharmacies due to pharmacy/plan contracts, or as the patient moves through the different stages of their insurance plan.

## 2023-09-05 NOTE — Telephone Encounter (Signed)
 Haven't seen any recent forms other than the pre-op risk form from Ortho for L3-L4 posterior lumbar fusion and laminectomy scheduled later this month.  That form was faxed back.  Pt taking Tramadol  prn for h/o chronic low back pain, lumbar radiculopathy, lumbar stenosis, osteoarthritis.

## 2023-09-10 NOTE — Progress Notes (Signed)
 Surgical Instructions   Your procedure is scheduled on Tuesday, January 21st, 2025. Report to [facility] Main Entrance "A" at 5:30 A.M., then check in with the Admitting office. Any questions or running late day of surgery: call [phone]  Questions prior to your surgery date: call [phone], Monday-Friday, 8am-4pm. If you experience any cold or flu symptoms such as cough, fever, chills, shortness of breath, etc. between now and your scheduled surgery, please notify us  at the above number.     Remember:  Do not eat after midnight the night before your surgery  You may drink clear liquids until 4:30 the morning of your surgery.   Clear liquids allowed are: Water , Non-Citrus Juices (without pulp), Carbonated Beverages, Clear Tea (no milk, honey, etc.), Black Coffee Only (NO MILK, CREAM OR POWDERED CREAMER of any kind), and Gatorade.  Patient Instructions  The night before surgery:  No food after midnight. ONLY clear liquids after midnight   The day of surgery (if you have diabetes): Drink ONE (1) 12 oz G2 given to you in your pre admission testing appointment by 4:30 the morning of surgery. Drink in one sitting. Do not sip.  This drink was given to you during your hospital  pre-op appointment visit.  Nothing else to drink after completing the  12 oz bottle of G2.         If you have questions, please contact your surgeon's office.     Take these medicines the morning of surgery with A SIP OF WATER : Gabapentin  (Neurontin )    May take these medicines IF NEEDED: Acetaminophen  (Tylenol ) Fluticasone  (Flonase ) Omeprazole  (Prilosec )    One week prior to surgery, STOP taking any Aspirin  (unless otherwise instructed by your surgeon) Aleve , Naproxen , Ibuprofen, Motrin, Advil, Goody's, BC's, all herbal medications, fish oil, and non-prescription vitamins.   WHAT DO I DO ABOUT MY DIABETES MEDICATION?   Do not take oral diabetes medicines (pills) the morning of surgery.  Do  NOT take Metformin  on the day of surgery.    HOW TO MANAGE YOUR DIABETES BEFORE AND AFTER SURGERY  Why is it important to control my blood sugar before and after surgery? Improving blood sugar levels before and after surgery helps healing and can limit problems. A way of improving blood sugar control is eating a healthy diet by:  Eating less sugar and carbohydrates  Increasing activity/exercise  Talking with your doctor about reaching your blood sugar goals High blood sugars (greater than 180 mg/dL) can raise your risk of infections and slow your recovery, so you will need to focus on controlling your diabetes during the weeks before surgery. Make sure that the doctor who takes care of your diabetes knows about your planned surgery including the date and location.  How do I manage my blood sugar before surgery? Check your blood sugar at least 4 times a day, starting 2 days before surgery, to make sure that the level is not too high or low.  Check your blood sugar the morning of your surgery when you wake up and every 2 hours until you get to the Short Stay unit.  If your blood sugar is less than 70 mg/dL, you will need to treat for low blood sugar: Do not take insulin . Treat a low blood sugar (less than 70 mg/dL) with  cup of clear juice (cranberry or apple), 4 glucose tablets, OR glucose gel. Recheck blood sugar in 15 minutes after treatment (to make sure it is greater than 70 mg/dL). If your blood  sugar is not greater than 70 mg/dL on recheck, call [phone] for further instructions. Report your blood sugar to the short stay nurse when you get to Short Stay.  If you are admitted to the hospital after surgery: Your blood sugar will be checked by the staff and you will probably be given insulin  after surgery (instead of oral diabetes medicines) to make sure you have good blood sugar levels. The goal for blood sugar control after surgery is 80-180 mg/dL.                      Do NOT  Smoke (Tobacco/Vaping) for 24 hours prior to your procedure.  If you use a CPAP at night, you may bring your mask/headgear for your overnight stay.   You will be asked to remove any contacts, glasses, piercing's, hearing aid's, dentures/partials prior to surgery. Please bring cases for these items if needed.    Patients discharged the day of surgery will not be allowed to drive home, and someone needs to stay with them for 24 hours.  SURGICAL WAITING ROOM VISITATION Patients may have no more than 2 support people in the waiting area - these visitors may rotate.   Pre-op nurse will coordinate an appropriate time for 1 ADULT support person, who may not rotate, to accompany patient in pre-op.  Children under the age of 26 must have an adult with them who is not the patient and must remain in the main waiting area with an adult.  If the patient needs to stay at the hospital during part of their recovery, the visitor guidelines for inpatient rooms apply.  Please refer to the [facility] website for the visitor guidelines for any additional information.   If you received a COVID test during your pre-op visit  it is requested that you wear a mask when out in public, stay away from anyone that may not be feeling well and notify your surgeon if you develop symptoms. If you have been in contact with anyone that has tested positive in the last 10 days please notify you surgeon.      Pre-operative 5 CHG Bathing Instructions   You can play a key role in reducing the risk of infection after surgery. Your skin needs to be as free of germs as possible. You can reduce the number of germs on your skin by washing with CHG (chlorhexidine  gluconate) soap before surgery. CHG is an antiseptic soap that kills germs and continues to kill germs even after washing.   DO NOT use if you have an allergy to chlorhexidine /CHG or antibacterial soaps. If your skin becomes reddened or irritated, stop using the CHG and  notify one of our RNs at [phone].   Please shower with the CHG soap starting 4 days before surgery using the following schedule:     Please keep in mind the following:  DO NOT shave, including legs and underarms, starting the day of your first shower.   You may shave your face at any point before/day of surgery.  Place clean sheets on your bed the day you start using CHG soap. Use a clean washcloth (not used since being washed) for each shower. DO NOT sleep with pets once you start using the CHG.   CHG Shower Instructions:  Wash your face and private area with normal soap. If you choose to wash your hair, wash first with your normal shampoo.  After you use shampoo/soap, rinse your hair and body thoroughly to  remove shampoo/soap residue.  Turn the water  OFF and apply about 3 tablespoons (45 ml) of CHG soap to a CLEAN washcloth.  Apply CHG soap ONLY FROM YOUR NECK DOWN TO YOUR TOES (washing for 3-5 minutes)  DO NOT use CHG soap on face, private areas, open wounds, or sores.  Pay special attention to the area where your surgery is being performed.  If you are having back surgery, having someone wash your back for you may be helpful. Wait 2 minutes after CHG soap is applied, then you may rinse off the CHG soap.  Pat dry with a clean towel  Put on clean clothes/pajamas   If you choose to wear lotion, please use ONLY the CHG-compatible lotions that are listed below.  Additional instructions for the day of surgery: DO NOT APPLY any lotions, deodorants, cologne, or perfumes.   Do not bring valuables to the hospital. [facility] is not responsible for any belongings/valuables. Do not wear nail polish, gel polish, artificial nails, or any other type of covering on natural nails (fingers and toes) Do not wear jewelry or makeup Put on clean/comfortable clothes.  Please brush your teeth.  Ask your nurse before applying any prescription medications to the skin.     CHG Compatible  Lotions   Aveeno Moisturizing lotion  Cetaphil Moisturizing Cream  Cetaphil Moisturizing Lotion  Clairol Herbal Essence Moisturizing Lotion, Dry Skin  Clairol Herbal Essence Moisturizing Lotion, Extra Dry Skin  Clairol Herbal Essence Moisturizing Lotion, Normal Skin  Curel Age Defying Therapeutic Moisturizing Lotion with Alpha Hydroxy  Curel Extreme Care Body Lotion  Curel Soothing Hands Moisturizing Hand Lotion  Curel Therapeutic Moisturizing Cream, Fragrance-Free  Curel Therapeutic Moisturizing Lotion, Fragrance-Free  Curel Therapeutic Moisturizing Lotion, Original Formula  Eucerin Daily Replenishing Lotion  Eucerin Dry Skin Therapy Plus Alpha Hydroxy Crme  Eucerin Dry Skin Therapy Plus Alpha Hydroxy Lotion  Eucerin Original Crme  Eucerin Original Lotion  Eucerin Plus Crme Eucerin Plus Lotion  Eucerin TriLipid Replenishing Lotion  Keri Anti-Bacterial Hand Lotion  Keri Deep Conditioning Original Lotion Dry Skin Formula Softly Scented  Keri Deep Conditioning Original Lotion, Fragrance Free Sensitive Skin Formula  Keri Lotion Fast Absorbing Fragrance Free Sensitive Skin Formula  Keri Lotion Fast Absorbing Softly Scented Dry Skin Formula  Keri Original Lotion  Keri Skin Renewal Lotion Keri Silky Smooth Lotion  Keri Silky Smooth Sensitive Skin Lotion  Nivea Body Creamy Conditioning Oil  Nivea Body Extra Enriched Lotion  Nivea Body Original Lotion  Nivea Body Sheer Moisturizing Lotion Nivea Crme  Nivea Skin Firming Lotion  NutraDerm 30 Skin Lotion  NutraDerm Skin Lotion  NutraDerm Therapeutic Skin Cream  NutraDerm Therapeutic Skin Lotion  ProShield Protective Hand Cream  Provon moisturizing lotion  Please read over the following fact sheets that you were given.

## 2023-09-11 ENCOUNTER — Other Ambulatory Visit: Payer: Self-pay | Admitting: Family Medicine

## 2023-09-11 ENCOUNTER — Other Ambulatory Visit: Payer: Self-pay

## 2023-09-11 ENCOUNTER — Encounter (HOSPITAL_COMMUNITY): Payer: Self-pay

## 2023-09-11 ENCOUNTER — Encounter (HOSPITAL_COMMUNITY)
Admission: RE | Admit: 2023-09-11 | Discharge: 2023-09-11 | Discharge status: Home / Self Care | Payer: PPO | Source: Ambulatory Visit | Attending: Orthopedic Surgery

## 2023-09-11 VITALS — BP 149/63 | HR 55 | Temp 98.3°F | Resp 16 | Ht 65.5 in | Wt 162.0 lb

## 2023-09-11 DIAGNOSIS — M545 Low back pain, unspecified: Secondary | ICD-10-CM

## 2023-09-11 DIAGNOSIS — E119 Type 2 diabetes mellitus without complications: Secondary | ICD-10-CM | POA: Diagnosis not present

## 2023-09-11 DIAGNOSIS — Z01818 Encounter for other preprocedural examination: Secondary | ICD-10-CM | POA: Diagnosis not present

## 2023-09-11 DIAGNOSIS — R001 Bradycardia, unspecified: Secondary | ICD-10-CM | POA: Diagnosis not present

## 2023-09-11 LAB — SURGICAL PCR SCREEN
MRSA, PCR: NEGATIVE
Staphylococcus aureus: NEGATIVE

## 2023-09-11 LAB — CBC
HCT: 32.6 % — ABNORMAL LOW (ref 36.0–46.0)
Hemoglobin: 11.2 g/dL — ABNORMAL LOW (ref 12.0–15.0)
MCH: 31.2 pg (ref 26.0–34.0)
MCHC: 34.4 g/dL (ref 30.0–36.0)
MCV: 90.8 fL (ref 80.0–100.0)
Platelets: 145 10*3/uL — ABNORMAL LOW (ref 150–400)
RBC: 3.59 MIL/uL — ABNORMAL LOW (ref 3.87–5.11)
RDW: 13.2 % (ref 11.5–15.5)
WBC: 4.9 10*3/uL (ref 4.0–10.5)
nRBC: 0 % (ref 0.0–0.2)

## 2023-09-11 LAB — BASIC METABOLIC PANEL
Anion gap: 7 (ref 5–15)
BUN: 11 mg/dL (ref 8–23)
CO2: 26 mmol/L (ref 22–32)
Calcium: 9.2 mg/dL (ref 8.9–10.3)
Chloride: 110 mmol/L (ref 98–111)
Creatinine, Ser: 0.4 mg/dL — ABNORMAL LOW (ref 0.44–1.00)
GFR, Estimated: >60 mL/min (ref >60)
Glucose, Bld: 116 mg/dL — ABNORMAL HIGH (ref 70–99)
Potassium: 4.2 mmol/L (ref 3.5–5.1)
Sodium: 143 mmol/L (ref 135–145)

## 2023-09-11 LAB — TYPE AND SCREEN
ABO/RH(D): O POS
Antibody Screen: NEGATIVE

## 2023-09-11 LAB — GLUCOSE, CAPILLARY: Glucose-Capillary: 92 mg/dL (ref 70–99)

## 2023-09-11 LAB — HEMOGLOBIN A1C
Hgb A1c MFr Bld: 6.5 % — ABNORMAL HIGH (ref 4.8–5.6)
Mean Plasma Glucose: 139.85 mg/dL

## 2023-09-11 NOTE — Telephone Encounter (Signed)
 Requesting: tramadol  Contract: N/A UDS: N/A Last Visit:08/16/23 Next Visit:11/27/2023 Last Refill:02/21/2023  Please Advise

## 2023-09-11 NOTE — Progress Notes (Signed)
 PCP - Dr. Cinda Craze Cardiologist - Denies  PPM/ICD - Denies Device Orders - n/a Rep Notified - n/a  Chest x-ray - n/a EKG - 09/11/23 Stress Test - Denies  ECHO - Denies Cardiac Cath - Denies  Sleep Study - Denies CPAP - n/a  Fasting Blood Sugar - 90-125 Checks Blood Sugar 3x/week.  Last dose of GLP1 agonist-  Denies GLP1 instructions: n/a  Blood Thinner Instructions: Denies Aspirin  Instructions: Denies - instructed not to take any.  ERAS Protcol - Yes, clears until 0430 PRE-SURGERY Ensure or G2- G2  Anesthesia review: Y, new EKG, HTN, DM  Patient denies shortness of breath, fever, cough and chest pain at PAT appointment. Patient denies any respiratory issues at this time.    All instructions explained to the patient, with a verbal understanding of the material. Patient agrees to go over the instructions while at home for a better understanding. Patient also instructed to self quarantine after being tested for COVID-19. The opportunity to ask questions was provided.

## 2023-09-12 ENCOUNTER — Telehealth: Payer: Self-pay

## 2023-09-12 NOTE — Progress Notes (Signed)
Care Guide Pharmacy Note  09/12/2023 Name: Renee Watts MRN: [id number] DOB: 1951/05/13  Referred By: Deeann Saint, MD Reason for referral: Care Coordination (TNM Diabetes. )   Renee Watts is a 73 y.o. year old female who is a primary care patient of Deeann Saint, MD.  Renee Watts was referred to the pharmacist for assistance related to: DMII  Successful contact was made with the patient to discuss pharmacy services.  Patient declines engagement at this time. Contact information was provided to the patient should they wish to reach out for assistance at a later time.  [facility]  [facility], [facility] Health Care Management Assistant Direct Dial: [phone]  Fax: [phone]

## 2023-09-16 NOTE — H&P (Addendum)
 Orthopedic Spine Surgery H&P Note  Assessment: Patient is a 73 y.o. female with lumbar radiculopathy   Plan: -Out of bed as tolerated, activity as tolerated, no brace -Covered the risks of surgery one more time with the patient and patient elected to proceed with planned surgery -Written consent verified -Hold anticoagulation in anticipation of surgery -Ancef and TXA on all to OR -NPO for procedure -Site marked -To OR when ready  Covered the risks of surgery one more time this morning. Those risks, included but were not limited to: dural tear, nerve root injury, paralysis, persistent pain, pseudarthrosis, infection, bleeding, hardware failure, adjacent segment disease, heart attack, death, stroke, fracture, blindness, and need for additional procedures.  ___________________________________________________________________________  Chief Complaint: low back pain that radiates into bilateral lower extremities  History: Patient is 73 y.o. female who has been previously seen in the office for low back pain that radiates into her anterior thighs and anteromedial legs. Right is more painful than her left. Her work up was consistent with lumbar radiculopathy. Her symptoms failed to improve with conservative treatment so operative management was discussed at the last office visit. The patient presents today with no changes in their symptoms since the last office visit. See previous office note for further details.    Review of systems: General: denies fevers and chills, myalgias Neurologic: denies recent changes in vision, slurred speech Abdomen: denies nausea, vomiting, hematemesis Respiratory: denies cough, shortness of breath  Past medical history: Depression/Anxiety GERD DM (last A1c was 6.5 on 09/11/2023) HLD   Allergies: NKDA   Past surgical history:  Right THA  Bilateral TKA Hysterectomy Polypectomy Bladder surgery   Social history: Denies use of nicotine product (smoking,  vaping, patches, smokeless) Alcohol use: denies Denies recreational drug use  Family history: -reviewed and not pertinent to lumbar radiculopathy   Physical Exam:  BMI of 26.6  General: no acute distress, appears stated age Neurologic: alert, answering questions appropriately, following commands Cardiovascular: regular rate, no cyanosis Respiratory: unlabored breathing on room air, symmetric chest rise Psychiatric: appropriate affect, normal cadence to speech   MSK (spine):  -Strength exam      Left  Right  EHL    4/5  3/5 TA    4/5  3/5 GSC    4/5  4/5 Knee extension  5/5  5/5 Knee flexion   5/5  5/5 Hip flexion   5/5  5/5  -Sensory exam    Sensation intact to light touch in L2-S1 nerve distributions of bilateral lower extremities   Patient name: Renee Watts Patient MRN: [id number] Date: 09/17/2023

## 2023-09-17 ENCOUNTER — Encounter (HOSPITAL_COMMUNITY): Payer: Self-pay | Admitting: Orthopedic Surgery

## 2023-09-17 ENCOUNTER — Ambulatory Visit (HOSPITAL_COMMUNITY): Payer: PPO | Admitting: Physician Assistant

## 2023-09-17 ENCOUNTER — Inpatient Hospital Stay (HOSPITAL_COMMUNITY)
Admission: RE | Admit: 2023-09-17 | Discharge: 2023-09-20 | DRG: 451 | Disposition: A | Discharge status: Home Health | Payer: PPO | Attending: Orthopedic Surgery | Admitting: Orthopedic Surgery

## 2023-09-17 ENCOUNTER — Ambulatory Visit (HOSPITAL_COMMUNITY): Payer: PPO | Admitting: Certified Registered Nurse Anesthetist

## 2023-09-17 ENCOUNTER — Other Ambulatory Visit: Payer: Self-pay

## 2023-09-17 ENCOUNTER — Ambulatory Visit (HOSPITAL_COMMUNITY): Payer: PPO

## 2023-09-17 ENCOUNTER — Encounter (HOSPITAL_COMMUNITY)
Admission: RE | Disposition: A | Discharge status: Home Health | Payer: Self-pay | Source: Home / Self Care | Attending: Orthopedic Surgery

## 2023-09-17 DIAGNOSIS — M4316 Spondylolisthesis, lumbar region: Principal | ICD-10-CM | POA: Diagnosis present

## 2023-09-17 DIAGNOSIS — M5416 Radiculopathy, lumbar region: Secondary | ICD-10-CM | POA: Diagnosis present

## 2023-09-17 DIAGNOSIS — Z9889 Other specified postprocedural states: Secondary | ICD-10-CM

## 2023-09-17 DIAGNOSIS — Z981 Arthrodesis status: Secondary | ICD-10-CM | POA: Diagnosis not present

## 2023-09-17 DIAGNOSIS — Z90710 Acquired absence of both cervix and uterus: Secondary | ICD-10-CM

## 2023-09-17 DIAGNOSIS — M48061 Spinal stenosis, lumbar region without neurogenic claudication: Secondary | ICD-10-CM

## 2023-09-17 DIAGNOSIS — M4726 Other spondylosis with radiculopathy, lumbar region: Secondary | ICD-10-CM | POA: Diagnosis present

## 2023-09-17 DIAGNOSIS — E785 Hyperlipidemia, unspecified: Secondary | ICD-10-CM | POA: Diagnosis present

## 2023-09-17 DIAGNOSIS — M4722 Other spondylosis with radiculopathy, cervical region: Secondary | ICD-10-CM

## 2023-09-17 DIAGNOSIS — Z79899 Other long term (current) drug therapy: Secondary | ICD-10-CM

## 2023-09-17 DIAGNOSIS — Z96653 Presence of artificial knee joint, bilateral: Secondary | ICD-10-CM | POA: Diagnosis present

## 2023-09-17 DIAGNOSIS — D62 Acute posthemorrhagic anemia: Secondary | ICD-10-CM | POA: Diagnosis not present

## 2023-09-17 DIAGNOSIS — K219 Gastro-esophageal reflux disease without esophagitis: Secondary | ICD-10-CM | POA: Diagnosis present

## 2023-09-17 DIAGNOSIS — F419 Anxiety disorder, unspecified: Secondary | ICD-10-CM | POA: Diagnosis present

## 2023-09-17 DIAGNOSIS — Z01818 Encounter for other preprocedural examination: Principal | ICD-10-CM

## 2023-09-17 DIAGNOSIS — E119 Type 2 diabetes mellitus without complications: Secondary | ICD-10-CM | POA: Diagnosis present

## 2023-09-17 DIAGNOSIS — F32A Depression, unspecified: Secondary | ICD-10-CM | POA: Diagnosis present

## 2023-09-17 DIAGNOSIS — Z96641 Presence of right artificial hip joint: Secondary | ICD-10-CM | POA: Diagnosis present

## 2023-09-17 HISTORY — PX: POSTERIOR FUSION PEDICLE SCREW PLACEMENT: SHX2186

## 2023-09-17 LAB — GLUCOSE, CAPILLARY
Glucose-Capillary: 138 mg/dL — ABNORMAL HIGH (ref 70–99)
Glucose-Capillary: 214 mg/dL — ABNORMAL HIGH (ref 70–99)
Glucose-Capillary: 155 mg/dL — ABNORMAL HIGH (ref 70–99)

## 2023-09-17 SURGERY — POSTERIOR LUMBAR FUSION 1 LEVEL
Anesthesia: General

## 2023-09-17 MED ORDER — EPHEDRINE SULFATE (PRESSORS) 50 MG/ML IJ SOLN
INTRAMUSCULAR | Status: DC | PRN
  Administered 2023-09-17: 15 mg via INTRAVENOUS

## 2023-09-17 MED ORDER — VANCOMYCIN HCL 1000 MG IV SOLR
INTRAVENOUS | Status: AC
  Filled 2023-09-17: qty 20

## 2023-09-17 MED ORDER — FLUTICASONE PROPIONATE 50 MCG/ACT NA SUSP: 1.0000 | Freq: Every day | NASAL | Status: DC | PRN

## 2023-09-17 MED ORDER — FENTANYL CITRATE (PF) 100 MCG/2ML IJ SOLN
25.0000 ug | INTRAMUSCULAR | Status: DC | PRN
  Administered 2023-09-17 (×2): 25 ug via INTRAVENOUS
  Administered 2023-09-17: 50 ug via INTRAVENOUS

## 2023-09-17 MED ORDER — THROMBIN 20000 UNITS EX KIT
PACK | CUTANEOUS | Status: AC
  Filled 2023-09-17: qty 1

## 2023-09-17 MED ORDER — SUGAMMADEX SODIUM 200 MG/2ML IV SOLN
INTRAVENOUS | Status: DC | PRN
  Administered 2023-09-17: 200 mg via INTRAVENOUS

## 2023-09-17 MED ORDER — ONDANSETRON HCL 4 MG/2ML IJ SOLN
4.0000 mg | Freq: Four times a day (QID) | INTRAMUSCULAR | Status: DC | PRN
  Administered 2023-09-18 – 2023-09-19 (×2): 4 mg via INTRAVENOUS
  Filled 2023-09-17 (×2): qty 2

## 2023-09-17 MED ORDER — INSULIN ASPART 100 UNIT/ML IJ SOLN: 0.0000 [IU] | Freq: Every day | INTRAMUSCULAR | Status: DC

## 2023-09-17 MED ORDER — PANTOPRAZOLE SODIUM 40 MG PO TBEC: 40.0000 mg | DELAYED_RELEASE_TABLET | Freq: Every day | ORAL | Status: DC | PRN

## 2023-09-17 MED ORDER — ONDANSETRON HCL 4 MG PO TABS
4.0000 mg | ORAL_TABLET | Freq: Four times a day (QID) | ORAL | Status: DC | PRN
  Administered 2023-09-18: 4 mg via ORAL
  Filled 2023-09-17: qty 1

## 2023-09-17 MED ORDER — LISINOPRIL 20 MG PO TABS
20.0000 mg | ORAL_TABLET | Freq: Every day | ORAL | Status: DC
  Administered 2023-09-19 – 2023-09-20 (×2): 20 mg via ORAL
  Filled 2023-09-17 (×2): qty 1

## 2023-09-17 MED ORDER — TRANEXAMIC ACID-NACL 1000-0.7 MG/100ML-% IV SOLN
1000.0000 mg | Freq: Once | INTRAVENOUS | Status: AC
  Administered 2023-09-17: 1000 mg via INTRAVENOUS
  Filled 2023-09-17: qty 100

## 2023-09-17 MED ORDER — POVIDONE-IODINE 10 % EX SWAB: 2.0000 | Freq: Once | CUTANEOUS | Status: DC

## 2023-09-17 MED ORDER — ROCURONIUM BROMIDE 10 MG/ML (PF) SYRINGE
PREFILLED_SYRINGE | INTRAVENOUS | Status: DC | PRN
  Administered 2023-09-17: 50 mg via INTRAVENOUS
  Administered 2023-09-17 (×2): 10 mg via INTRAVENOUS

## 2023-09-17 MED ORDER — FENTANYL CITRATE (PF) 100 MCG/2ML IJ SOLN
INTRAMUSCULAR | Status: AC
  Filled 2023-09-17: qty 2

## 2023-09-17 MED ORDER — FENTANYL CITRATE (PF) 250 MCG/5ML IJ SOLN
INTRAMUSCULAR | Status: AC
  Filled 2023-09-17: qty 5

## 2023-09-17 MED ORDER — ACETAMINOPHEN 10 MG/ML IV SOLN
INTRAVENOUS | Status: AC
  Filled 2023-09-17: qty 100

## 2023-09-17 MED ORDER — OXYCODONE HCL 5 MG PO TABS
ORAL_TABLET | ORAL | Status: AC
  Filled 2023-09-17: qty 2

## 2023-09-17 MED ORDER — HYDROMORPHONE HCL 1 MG/ML IJ SOLN
INTRAMUSCULAR | Status: AC
  Filled 2023-09-17: qty 0.5

## 2023-09-17 MED ORDER — SODIUM CHLORIDE 0.9% FLUSH: 3.0000 mL | INTRAVENOUS | Status: DC | PRN

## 2023-09-17 MED ORDER — CEFAZOLIN SODIUM-DEXTROSE 2-4 GM/100ML-% IV SOLN
2.0000 g | Freq: Four times a day (QID) | INTRAVENOUS | Status: AC
  Administered 2023-09-17 – 2023-09-18 (×2): 2 g via INTRAVENOUS
  Filled 2023-09-17 (×2): qty 100

## 2023-09-17 MED ORDER — FUROSEMIDE 20 MG PO TABS
20.0000 mg | ORAL_TABLET | Freq: Every day | ORAL | Status: DC
  Administered 2023-09-18 – 2023-09-20 (×3): 20 mg via ORAL
  Filled 2023-09-17 (×3): qty 1

## 2023-09-17 MED ORDER — OMEPRAZOLE MAGNESIUM 20 MG PO TBEC: 20.0000 mg | DELAYED_RELEASE_TABLET | Freq: Every day | ORAL | Status: DC | PRN

## 2023-09-17 MED ORDER — GABAPENTIN 300 MG PO CAPS
600.0000 mg | ORAL_CAPSULE | Freq: Every day | ORAL | Status: DC
  Administered 2023-09-17 – 2023-09-19 (×3): 600 mg via ORAL
  Filled 2023-09-17 (×3): qty 2

## 2023-09-17 MED ORDER — TRANEXAMIC ACID-NACL 1000-0.7 MG/100ML-% IV SOLN
1000.0000 mg | INTRAVENOUS | Status: AC
  Administered 2023-09-17: 1000 mg via INTRAVENOUS
  Filled 2023-09-17: qty 100

## 2023-09-17 MED ORDER — SENNA 8.6 MG PO TABS
1.0000 | ORAL_TABLET | Freq: Two times a day (BID) | ORAL | Status: DC
  Administered 2023-09-17 – 2023-09-20 (×6): 8.6 mg via ORAL
  Filled 2023-09-17 (×6): qty 1

## 2023-09-17 MED ORDER — 0.9 % SODIUM CHLORIDE (POUR BTL) OPTIME
TOPICAL | Status: DC | PRN
  Administered 2023-09-17: 1000 mL

## 2023-09-17 MED ORDER — POTASSIUM CHLORIDE CRYS ER 20 MEQ PO TBCR
20.0000 meq | EXTENDED_RELEASE_TABLET | Freq: Every day | ORAL | Status: DC
  Administered 2023-09-17 – 2023-09-20 (×4): 20 meq via ORAL
  Filled 2023-09-17 (×4): qty 1

## 2023-09-17 MED ORDER — PROPOFOL 10 MG/ML IV BOLUS
INTRAVENOUS | Status: DC | PRN
  Administered 2023-09-17: 120 mg via INTRAVENOUS

## 2023-09-17 MED ORDER — CHLORHEXIDINE GLUCONATE 0.12 % MT SOLN
15.0000 mL | Freq: Once | OROMUCOSAL | Status: AC
  Administered 2023-09-17: 15 mL via OROMUCOSAL
  Filled 2023-09-17: qty 15

## 2023-09-17 MED ORDER — POLYETHYLENE GLYCOL 3350 17 G PO PACK
17.0000 g | PACK | Freq: Two times a day (BID) | ORAL | Status: DC
  Administered 2023-09-17 – 2023-09-20 (×6): 17 g via ORAL
  Filled 2023-09-17 (×6): qty 1

## 2023-09-17 MED ORDER — LACTATED RINGERS IV SOLN: INTRAVENOUS | Status: DC | PRN

## 2023-09-17 MED ORDER — HYDROMORPHONE HCL 1 MG/ML IJ SOLN
INTRAMUSCULAR | Status: DC | PRN
  Administered 2023-09-17: .5 mg via INTRAVENOUS

## 2023-09-17 MED ORDER — ACETAMINOPHEN 10 MG/ML IV SOLN
INTRAVENOUS | Status: DC | PRN
  Administered 2023-09-17: 1000 mg via INTRAVENOUS

## 2023-09-17 MED ORDER — GABAPENTIN 300 MG PO CAPS
300.0000 mg | ORAL_CAPSULE | Freq: Two times a day (BID) | ORAL | Status: DC
Start: 2023-09-18 — End: 2023-09-20
  Administered 2023-09-18 – 2023-09-20 (×5): 300 mg via ORAL
  Filled 2023-09-17 (×5): qty 1

## 2023-09-17 MED ORDER — DEXAMETHASONE SODIUM PHOSPHATE 10 MG/ML IJ SOLN
10.0000 mg | Freq: Once | INTRAMUSCULAR | Status: AC
  Administered 2023-09-17: 4 mg via INTRAVENOUS

## 2023-09-17 MED ORDER — METFORMIN HCL ER 750 MG PO TB24
750.0000 mg | ORAL_TABLET | Freq: Every day | ORAL | Status: DC
  Administered 2023-09-18 – 2023-09-20 (×3): 750 mg via ORAL
  Filled 2023-09-17 (×3): qty 1

## 2023-09-17 MED ORDER — METHYLPREDNISOLONE ACETATE 40 MG/ML IJ SUSP
INTRAMUSCULAR | Status: AC
  Filled 2023-09-17: qty 1

## 2023-09-17 MED ORDER — ACETAMINOPHEN 500 MG PO TABS
1000.0000 mg | ORAL_TABLET | Freq: Three times a day (TID) | ORAL | Status: DC
  Administered 2023-09-17 – 2023-09-20 (×9): 1000 mg via ORAL
  Filled 2023-09-17 (×9): qty 2

## 2023-09-17 MED ORDER — OXYCODONE HCL 5 MG PO TABS
5.0000 mg | ORAL_TABLET | ORAL | Status: DC | PRN
  Administered 2023-09-17 – 2023-09-20 (×12): 10 mg via ORAL
  Filled 2023-09-17 (×11): qty 2

## 2023-09-17 MED ORDER — INSULIN ASPART 100 UNIT/ML IJ SOLN
0.0000 [IU] | Freq: Three times a day (TID) | INTRAMUSCULAR | Status: DC
  Administered 2023-09-17: 5 [IU] via SUBCUTANEOUS
  Administered 2023-09-18 – 2023-09-19 (×4): 3 [IU] via SUBCUTANEOUS
  Administered 2023-09-20: 2 [IU] via SUBCUTANEOUS

## 2023-09-17 MED ORDER — ORAL CARE MOUTH RINSE: 15.0000 mL | Freq: Once | OROMUCOSAL | Status: AC

## 2023-09-17 MED ORDER — HYDROMORPHONE HCL 1 MG/ML IJ SOLN
1.0000 mg | INTRAMUSCULAR | Status: DC | PRN
  Administered 2023-09-17: 1 mg via INTRAVENOUS
  Filled 2023-09-17: qty 1

## 2023-09-17 MED ORDER — CEFAZOLIN SODIUM-DEXTROSE 2-4 GM/100ML-% IV SOLN
2.0000 g | INTRAVENOUS | Status: AC
  Administered 2023-09-17: 2 g via INTRAVENOUS
  Filled 2023-09-17: qty 100

## 2023-09-17 MED ORDER — BUPIVACAINE-EPINEPHRINE (PF) 0.25% -1:200000 IJ SOLN
INTRAMUSCULAR | Status: AC
  Filled 2023-09-17: qty 30

## 2023-09-17 MED ORDER — ONDANSETRON HCL 4 MG/2ML IJ SOLN
INTRAMUSCULAR | Status: DC | PRN
  Administered 2023-09-17: 4 mg via INTRAVENOUS

## 2023-09-17 MED ORDER — FENTANYL CITRATE (PF) 250 MCG/5ML IJ SOLN
INTRAMUSCULAR | Status: DC | PRN
  Administered 2023-09-17: 100 ug via INTRAVENOUS
  Administered 2023-09-17 (×3): 50 ug via INTRAVENOUS

## 2023-09-17 MED ORDER — BUPIVACAINE-EPINEPHRINE (PF) 0.25% -1:200000 IJ SOLN
INTRAMUSCULAR | Status: DC | PRN
  Administered 2023-09-17: 30 mL via PERINEURAL

## 2023-09-17 MED ORDER — GABAPENTIN 300 MG PO CAPS: 300.0000 mg | ORAL_CAPSULE | Freq: Three times a day (TID) | ORAL | Status: DC

## 2023-09-17 MED ORDER — VANCOMYCIN HCL 1000 MG IV SOLR
INTRAVENOUS | Status: DC | PRN
  Administered 2023-09-17: 1000 mg

## 2023-09-17 MED ORDER — LIDOCAINE 2% (20 MG/ML) 5 ML SYRINGE
INTRAMUSCULAR | Status: DC | PRN
  Administered 2023-09-17: 100 mg via INTRAVENOUS

## 2023-09-17 MED ORDER — SODIUM CHLORIDE 0.9% FLUSH: 3.0000 mL | Freq: Two times a day (BID) | INTRAVENOUS | Status: DC

## 2023-09-17 SURGICAL SUPPLY — 62 items
BENZOIN TINCTURE PRP APPL 2/3 (GAUZE/BANDAGES/DRESSINGS) ×2 IMPLANT
BONE FIBERS PLIAFX 10 (Bone Implant) ×1 IMPLANT
BUR NEURO DRILL SOFT 3.0X3.8M (BURR) ×2 IMPLANT
BUR ROUND FLUTED 4 SOFT TCH (BURR) IMPLANT
CANISTER SUCT 3000ML PPV (MISCELLANEOUS) ×2 IMPLANT
CLSR STERI-STRIP ANTIMIC 1/2X4 (GAUZE/BANDAGES/DRESSINGS) ×2 IMPLANT
CORD BIPOLAR FORCEPS 12FT (ELECTRODE) IMPLANT
COVER SURGICAL LIGHT HANDLE (MISCELLANEOUS) ×2 IMPLANT
DRAIN HEMOVAC 7FR (DRAIN) ×4 IMPLANT
DRAPE C-ARM 42X72 X-RAY (DRAPES) ×2 IMPLANT
DRAPE MICROSCOPE LEICA 54X105 (DRAPES) ×2 IMPLANT
DRAPE SURG 17X23 STRL (DRAPES) ×8 IMPLANT
DRAPE UTILITY XL STRL (DRAPES) ×4 IMPLANT
DRESSING MEPILEX FLEX 4X4 (GAUZE/BANDAGES/DRESSINGS) ×2 IMPLANT
DRSG MEPILEX FLEX 4X4 (GAUZE/BANDAGES/DRESSINGS) IMPLANT
DRSG MEPILEX POST OP 4X8 (GAUZE/BANDAGES/DRESSINGS) IMPLANT
DRSG TEGADERM 4X10 (GAUZE/BANDAGES/DRESSINGS) ×2 IMPLANT
DRSG TEGADERM 4X4.75 (GAUZE/BANDAGES/DRESSINGS) IMPLANT
DURAPREP 26ML APPLICATOR (WOUND CARE) ×2 IMPLANT
ELECT COATED BLADE 2.86 ST (ELECTRODE) ×2 IMPLANT
ELECT PENCIL ROCKER SW 15FT (MISCELLANEOUS) ×2 IMPLANT
ELECT REM PT RETURN 9FT ADLT (ELECTROSURGICAL) ×1 IMPLANT
ELECTRODE REM PT RTRN 9FT ADLT (ELECTROSURGICAL) ×2 IMPLANT
EVACUATOR 1/8 PVC DRAIN (DRAIN) IMPLANT
GAUZE SPONGE 4X4 12PLY STRL (GAUZE/BANDAGES/DRESSINGS) ×2 IMPLANT
GLOVE BIO SURGEON STRL SZ7.5 (GLOVE) ×2 IMPLANT
GLOVE BIOGEL PI IND STRL 7.5 (GLOVE) ×2 IMPLANT
GLOVE INDICATOR 8.0 STRL GRN (GLOVE) ×2 IMPLANT
GOWN STRL REUS W/ TWL XL LVL3 (GOWN DISPOSABLE) ×2 IMPLANT
GRAFT BNE FBR PLIAFX PRIME 10 (Bone Implant) IMPLANT
KIT BASIN OR (CUSTOM PROCEDURE TRAY) ×2 IMPLANT
KIT POSITION SURG JACKSON T1 (MISCELLANEOUS) ×2 IMPLANT
KIT TURNOVER KIT B (KITS) ×2 IMPLANT
NDL 22X1.5 STRL (OR ONLY) (MISCELLANEOUS) ×2 IMPLANT
NEEDLE 22X1.5 STRL (OR ONLY) (MISCELLANEOUS) ×1 IMPLANT
NS IRRIG 1000ML POUR BTL (IV SOLUTION) ×2 IMPLANT
PACK LAMINECTOMY ORTHO (CUSTOM PROCEDURE TRAY) ×2 IMPLANT
PATTIES SURGICAL .5 X.5 (GAUZE/BANDAGES/DRESSINGS) IMPLANT
PATTIES SURGICAL .5X1.5 (GAUZE/BANDAGES/DRESSINGS) IMPLANT
ROD RELINE LORDOTIC 5.5X45 (Rod) IMPLANT
ROD RELINE-O LORD 5.5X50MM (Rod) IMPLANT
SCREW LOCK RELINE 5.5 TULIP (Screw) IMPLANT
SCREW RELINE-O POLY 6.5X45 (Screw) IMPLANT
SCREW RELINE-O POLY 6.5X50MM (Screw) IMPLANT
SPONGE SURGIFOAM ABS GEL 100 (HEMOSTASIS) ×2 IMPLANT
SPONGE T-LAP 4X18 ~~LOC~~+RFID (SPONGE) ×2 IMPLANT
STRIP CLOSURE SKIN 1/2X4 (GAUZE/BANDAGES/DRESSINGS) IMPLANT
SUCTION TUBE FRAZIER 10FR DISP (SUCTIONS) ×2 IMPLANT
SUT BONE WAX W31G (SUTURE) ×2 IMPLANT
SUT ETHILON 2 0 FS 18 (SUTURE) IMPLANT
SUT MNCRL AB 3-0 PS2 27 (SUTURE) IMPLANT
SUT VIC AB 0 CT1 18XCR BRD8 (SUTURE) ×2 IMPLANT
SUT VIC AB 2-0 CT1 18 (SUTURE) ×2 IMPLANT
SUT VIC AB 2-0 CT2 18 VCP726D (SUTURE) IMPLANT
SUT VIC AB 3-0 PS2 18XBRD (SUTURE) ×2 IMPLANT
SYR BULB IRRIG 60ML STRL (SYRINGE) ×2 IMPLANT
SYR CONTROL 10ML LL (SYRINGE) ×2 IMPLANT
TOWEL GREEN STERILE (TOWEL DISPOSABLE) ×2 IMPLANT
TOWEL GREEN STERILE FF (TOWEL DISPOSABLE) ×2 IMPLANT
TUBING FEATHERFLOW (TUBING) ×2 IMPLANT
WATER STERILE IRR 1000ML POUR (IV SOLUTION) ×2 IMPLANT
YANKAUER SUCT BULB TIP NO VENT (SUCTIONS) IMPLANT

## 2023-09-17 NOTE — Op Note (Signed)
Orthopedic Spine Surgery Operative Report  Procedure: L3, L4 lumbar laminectomies with partial medial facetectomies L3/4 posterior lumbar spinal arthrodesis L3, L4 pedicle screw instrumentation with rods Application of locally harvested autograft (spinous processes, removed lamina) Application of allograft morselized bone and demineralized bone matrix  Modifier: none  Date of procedure: 09/17/2023  Patient name: Renee Watts MRN: [id number] DOB: [birth year]  Surgeon: Willia Craze, MD Assistant: none Pre-operative diagnosis: lumbar stenosis, lumbar spondylolisthesis, lumbar radiculopathy Post-operative diagnosis: same as above Findings: L3/4 hypertrophic facets and thickened ligamentum flavum, L3/4 spondylolisthesis  Specimens: none Anesthesia: general EBL: 150cc Complications: none Pre-incision antibiotic: ancef TXA was given prior to incision as well  Implants:  Nuvasive Reline screws and rods (see below) Demineralized bone matrix   Indication for procedure: Patient is a 73 y.o. female who presented to the office with symptoms consistent with lumbar radiculopathy. The patient had tried conservative treatments that did not provide any lasting relief. As result, operative management was discussed. L3 and L4 segment laminectomies with partial medial facetectomies and posterior instrumented spinal fusion was presented as a treatment option. The risks including but not limited to dural tear, nerve root injury, paralysis, persistent pain, pseudarthrosis, infection, bleeding, hardware failure/malposition, dvt/pe, adjacent segment disease, heart attack, death, fracture, and need for additional procedures were discussed with the patient. The benefit of the surgery would be relief of the patient's radiating leg pain. The alternatives to surgical management were covered with the patient and included continued monitoring, physical therapy, over-the-counter pain medications, ambulatory aids,  injections, and activity modification. All the patient's questions were answered to her satisfaction. After this discussion, the patient expressed understanding and elected to proceed with surgical intervention.    Procedure Description: The patient was met in the pre-operative holding area. The patient's identity and consent were verified. The operative site was marked. The patient's remaining questions about the surgery were answered. The patient was brought back to the operating room. General anesthesia was induced and an endotracheal tube was placed by the anesthesia staff. The patient was transferred to the prone Rome table in the prone position. All bony prominences were well padded. The head of the bed was slightly elevated and the eyes were free from compression by the face pillow. The surgical area was cleansed with alcohol. Fluoroscopy was then brought in to check rotation on the AP image and to mark the levels on the lateral image. The patient's skin was then prepped and draped in a standard, sterile fashion. A time out was performed that identified the patient, the procedure, and the operative levels. All team members agreed with what was stated in the time out.   A midline incision over the spinous processes of the previously marked levels was made and sharp dissection was continued down through the skin and dermis. Electrocautery was then used to continue the midline dissection down to the level of the spinous process. Subperiosteal dissection was performed using electrocautery to expose the lamina out lateral to the facet joint capsule. Care was taken to not violate the facet joint capsules. A lateral fluoroscopic image was taken to confirm the level. Subperiosteal dissection with electrocautery was then done to expose the L3 lamina and pars along with the L4 lamina. The L3/4 facet joint capsules were removed using electrocautery, while the L2/3 facet capsules were preserved bilaterally.  Electrocautery was then used to dissect lateral the facets to find the transverse process at each level. Electrocautery was used to clear the soft tissue off the transverse processes.  Next, the pedicle screws were placed. The starting points for the pedicle screws were identified using anatomical landmarks. Fluoroscopy was brought in the AP view to confirm the start points. Each pedicle screw was placed under AP fluoroscopy. A true AP was obtained of the vertebra where screw was being placed. Then, a pilot hole was made with a 3mm matchstick burr at the start point. A pedicle finder was advanced into the pilot hole. AP fluoroscopy was used to confirm proper starting point. A image was taken when the pedicle finder had been advanced 10mm, 15mm, and at 20mm. The pedicle finder remained within the pedicle and did not breach medially at any of those markers. The pedicle finder was then advanced to 35mm and then withdrawn. A feeler was placed into the hole and confirmed no pedicle wall breaches. It was advanced to the ventral cortex by palpation. The screw length was then estimated off of the depth of the feeler. The widths of the screws had been predetermined as they were measured pre-operatively on the patient's advanced imaging. That measured length and pre-determined width screw was then inserted under AP fluoroscopic guidance. The screw was advanced approximately 10mm, 15mm, and then 20mm. At each one of those intervals an image was taken to confirm that the screw was following the trajectory of the pedicle and had not breached medially. The screw was then advanced fully until there was good purchase. This process was repeated for every pedicle screw.     The screws that were inserted were NuVasive Reline:               Left                  Right L3 6.5x50  6.5x50 L4 6.5x45  6.5x50   AP and lateral fluoroscopic images were then taken to visualize all the screws. The screws were in satisfactory position.    A rongeur was used to remove the spinous processes and interspinous ligaments from the L2/3 interspinous ligament to the cranial aspect of the L4 spinous process. The spinous processes, but not the interspinous ligaments or soft tissue, was saved and morselized on the back table. Bone wax was used to obtain hemostasis at the bleeding bony surfaces. A high-speed burr was used to thin the lamina at L3 level to the level of the ligamentum flavum. The burr was then used thin the cranial aspect of the L4 lamina to the approximate level of the ligamentum. A series of Kerrison rongeurs were used to remove the remaining lamina and ligamentum overlying the thecal sac at L3 and L4. A woodsen was used to protect the thecal sac as a series of kerrison rongeurs were used to remove the medial portion of the L3 inferior facet and the L4 superior facet on both sides.   A woodsen was placed into the laminectomy site to palpate for any remaining areas of stenosis. Once it was confirmed with the woodsen that decompression had been completed from the medial pedicle wall of L4 to the contralateral medial pedicle wall and from the pedicle of L4 to the pedicle of L3, decompression was determined to be completed. A precontoured titanium rod was placed into the pedicle screws and set screws were tightened over the rod into each pedicle screw. The same process was repeated for the contralateral side. All the set screws were final tightened. Final AP and lateral fluoroscopic images confirmed satisfactory position of the posterior instrumentation. The wound was copiously irrigated with sterile saline.  A high speed burr was used to decorticate the L3/4 facet joints, the L3 pars interarticularis, the cranial aspect of the L4 lamina, and the transverse processes at L3 and L4. The locally harvested and morselized autograft and demineralized bone matrix were placed over the decorticated bone. All bony fragments that inadvertently went  into the laminectomy defect were removed. The decompressed area was again palpated with a woodsen which confirmed satisfactory decompression.   1g of vancomycin powder was placed into the wound. A medium hemovac drain was placed deep to the fascia. The fascia was reapproximated with 0 vicryl suture. A subcutaneous hemovac drain was placed above the fascia. The subcutaneous fat was reapproximated with 0 vicryl suture. The deep dermal layer was reapproximated with 2-0 vicryl. The skin as closed with a 3-0 running monocryl. All counts were correct at the end of the case. The incision was dressed with steri strips and benzoine. An island dressing was placed over the wound. The patient was transferred back to a bed and brought to the post-anesthesia care unit by anesthesia staff in stable condition.  Post-operative plan: The patient will recover in the post-anesthesia care unit and then go to the floor. The patient will receive two post-operative doses of ancef. She will get another dose of TXA. The patient will be out of bed as tolerated with no brace. The patient will work with physical therapy. The drains will be removed on the day of discharge. The patient will likely discharge to home in the next day or two.      Willia Craze, MD Orthopedic Surgeon

## 2023-09-17 NOTE — Anesthesia Postprocedure Evaluation (Signed)
Anesthesia Post Note  Patient: Renee Watts  Procedure(s) Performed: LUMBAR THREE-FOUR LAMINECTOMY AND POSTERIOR INSTRUMENTAL SPINAL FUSION     Patient location during evaluation: PACU Anesthesia Type: General Level of consciousness: awake and alert Pain management: pain level controlled Vital Signs Assessment: post-procedure vital signs reviewed and stable Respiratory status: spontaneous breathing, nonlabored ventilation, respiratory function stable and patient connected to nasal cannula oxygen Cardiovascular status: blood pressure returned to baseline and stable Postop Assessment: no apparent nausea or vomiting Anesthetic complications: no   No notable events documented.  Last Vitals:  Vitals:   09/17/23 1430 09/17/23 1445  BP: 133/65 130/69  Pulse: 64 71  Resp: 14 18  Temp:  37.1 C  SpO2: 98% 99%    Last Pain:  Vitals:   09/17/23 1445  TempSrc:   PainSc: 7                  Mea Ozga P Jorge Retz

## 2023-09-17 NOTE — Plan of Care (Signed)
  Problem: Education: Goal: Knowledge of General Education information will improve Description: Including pain rating scale, medication(s)/side effects and non-pharmacologic comfort measures Outcome: Progressing   Problem: Clinical Measurements: Goal: Will remain free from infection Outcome: Progressing   Problem: Activity: Goal: Risk for activity intolerance will decrease Outcome: Progressing   Problem: Nutrition: Goal: Adequate nutrition will be maintained Outcome: Progressing   Problem: Coping: Goal: Level of anxiety will decrease Outcome: Progressing   Problem: Pain Managment: Goal: General experience of comfort will improve and/or be controlled Outcome: Progressing

## 2023-09-17 NOTE — Transfer of Care (Signed)
Immediate Anesthesia Transfer of Care Note  Patient: Renee Watts  Procedure(s) Performed: LUMBAR THREE-FOUR LAMINECTOMY AND POSTERIOR INSTRUMENTAL SPINAL FUSION  Patient Location: PACU  Anesthesia Type:General  Level of Consciousness: awake, alert , and oriented  Airway & Oxygen Therapy: Patient Spontanous Breathing and Patient connected to face mask oxygen  Post-op Assessment: Report given to RN and Post -op Vital signs reviewed and stable  Post vital signs: Reviewed and stable  Last Vitals:  Vitals Value Taken Time  BP 172/126 09/17/23 1205  Temp    Pulse 100 09/17/23 1208  Resp 21 09/17/23 1208  SpO2 100 % 09/17/23 1208  Vitals shown include unfiled device data.  Last Pain:  Vitals:   09/17/23 0641  TempSrc:   PainSc: 7       Patients Stated Pain Goal: 1 (09/17/23 0641)  Complications: No notable events documented.

## 2023-09-17 NOTE — Progress Notes (Addendum)
 Orthopedic Surgery Post-operative Progress Note  Assessment: Patient is a 73 y.o. female who is currently admitted after undergoing L3/4 laminectomy and PSIF   Plan: -Operative plans complete -Needs upright films when able -Drain to be maintained until output slows -Out of bed as tolerated, no brace -No bending/lifting/twisting greater than 10 pounds -OT evaluate and treat -Pain control -Diabetic diet -No chemoprophylaxis for dvt or antiplatelets for 72 hours after surgery -Ancef x2 post-operative doses -Disposition: to floor from PACU  ___________________________________________________________________________   Subjective: No acute events since surgery. Pain adequately controlled.   Objective:  General: no acute distress, appropriate affect Neurologic: alert, answering questions appropriately, following commands Respiratory: unlabored breathing on room air Skin: dressing clear/dry/intact, drains with serosanguinous output  MSK (spine):  -Strength exam      Right  Left  EHL    3/5  4/5 TA    3/5  4/5 GSC    4/5  4/5 Knee extension  5/5  5/5 Hip flexion   5/5  5/5  -Sensory exam    Sensation intact to light touch in L2-S1 nerve distributions of bilateral lower extremities   Patient name: Renee Watts Patient MRN: [id number] Date: 09/17/23

## 2023-09-17 NOTE — Anesthesia Procedure Notes (Signed)
Procedure Name: Intubation Date/Time: 09/17/2023 7:50 AM  Performed by: Hali Marry, CRNAPre-anesthesia Checklist: Patient identified, Emergency Drugs available, Suction available and Patient being monitored Patient Re-evaluated:Patient Re-evaluated prior to induction Oxygen Delivery Method: Circle system utilized Preoxygenation: Pre-oxygenation with 100% oxygen Induction Type: IV induction Ventilation: Mask ventilation without difficulty Laryngoscope Size: Mac and 3 Grade View: Grade I Tube type: Oral Number of attempts: 1 Airway Equipment and Method: Stylet and Oral airway Placement Confirmation: ETT inserted through vocal cords under direct vision, positive ETCO2 and breath sounds checked- equal and bilateral Tube secured with: Tape Dental Injury: Teeth and Oropharynx as per pre-operative assessment

## 2023-09-17 NOTE — Anesthesia Preprocedure Evaluation (Addendum)
Anesthesia Evaluation  Patient identified by MRN, date of birth, ID band Patient awake    Reviewed: Allergy & Precautions, NPO status , Patient's Chart, lab work & pertinent test results  Airway Mallampati: II  TM Distance: >3 FB Neck ROM: Full    Dental no notable dental hx.    Pulmonary neg pulmonary ROS   Pulmonary exam normal        Cardiovascular hypertension,  Rhythm:Regular Rate:Normal     Neuro/Psych    Depression    negative neurological ROS     GI/Hepatic Neg liver ROS,GERD  Medicated,,  Endo/Other  diabetes, Type 2, Oral Hypoglycemic Agents    Renal/GU negative Renal ROS  negative genitourinary   Musculoskeletal  (+) Arthritis , Osteoarthritis,    Abdominal Normal abdominal exam  (+)   Peds  Hematology   Anesthesia Other Findings   Reproductive/Obstetrics                             Anesthesia Physical Anesthesia Plan  ASA: 2  Anesthesia Plan:    Post-op Pain Management: Ofirmev IV (intra-op)*   Induction: Intravenous  PONV Risk Score and Plan: 2 and Ondansetron, Dexamethasone and Treatment may vary due to age or medical condition  Airway Management Planned: Mask and Oral ETT  Additional Equipment: None  Intra-op Plan:   Post-operative Plan: Extubation in OR  Informed Consent: I have reviewed the patients History and Physical, chart, labs and discussed the procedure including the risks, benefits and alternatives for the proposed anesthesia with the patient or authorized representative who has indicated his/her understanding and acceptance.     Dental advisory given  Plan Discussed with: CRNA  Anesthesia Plan Comments:        Anesthesia Quick Evaluation

## 2023-09-18 ENCOUNTER — Observation Stay (HOSPITAL_COMMUNITY): Payer: PPO

## 2023-09-18 ENCOUNTER — Encounter (HOSPITAL_COMMUNITY): Payer: Self-pay | Admitting: Orthopedic Surgery

## 2023-09-18 DIAGNOSIS — Z471 Aftercare following joint replacement surgery: Secondary | ICD-10-CM | POA: Diagnosis not present

## 2023-09-18 DIAGNOSIS — M48061 Spinal stenosis, lumbar region without neurogenic claudication: Secondary | ICD-10-CM | POA: Diagnosis not present

## 2023-09-18 DIAGNOSIS — M4316 Spondylolisthesis, lumbar region: Secondary | ICD-10-CM | POA: Diagnosis not present

## 2023-09-18 DIAGNOSIS — R2681 Unsteadiness on feet: Secondary | ICD-10-CM | POA: Diagnosis not present

## 2023-09-18 DIAGNOSIS — M47817 Spondylosis without myelopathy or radiculopathy, lumbosacral region: Secondary | ICD-10-CM | POA: Diagnosis not present

## 2023-09-18 LAB — GLUCOSE, CAPILLARY
Glucose-Capillary: 199 mg/dL — ABNORMAL HIGH (ref 70–99)
Glucose-Capillary: 152 mg/dL — ABNORMAL HIGH (ref 70–99)
Glucose-Capillary: 154 mg/dL — ABNORMAL HIGH (ref 70–99)
Glucose-Capillary: 143 mg/dL — ABNORMAL HIGH (ref 70–99)

## 2023-09-18 LAB — BASIC METABOLIC PANEL
Anion gap: 8 (ref 5–15)
BUN: 6 mg/dL — ABNORMAL LOW (ref 8–23)
CO2: 24 mmol/L (ref 22–32)
Calcium: 8.6 mg/dL — ABNORMAL LOW (ref 8.9–10.3)
Chloride: 103 mmol/L (ref 98–111)
Creatinine, Ser: 0.36 mg/dL — ABNORMAL LOW (ref 0.44–1.00)
GFR, Estimated: >60 mL/min (ref >60)
Glucose, Bld: 148 mg/dL — ABNORMAL HIGH (ref 70–99)
Potassium: 3.7 mmol/L (ref 3.5–5.1)
Sodium: 135 mmol/L (ref 135–145)

## 2023-09-18 LAB — CBC
HCT: 27.7 % — ABNORMAL LOW (ref 36.0–46.0)
Hemoglobin: 9.5 g/dL — ABNORMAL LOW (ref 12.0–15.0)
MCH: 31 pg (ref 26.0–34.0)
MCHC: 34.3 g/dL (ref 30.0–36.0)
MCV: 90.5 fL (ref 80.0–100.0)
Platelets: 122 10*3/uL — ABNORMAL LOW (ref 150–400)
RBC: 3.06 MIL/uL — ABNORMAL LOW (ref 3.87–5.11)
RDW: 13.1 % (ref 11.5–15.5)
WBC: 9.1 10*3/uL (ref 4.0–10.5)
nRBC: 0 % (ref 0.0–0.2)

## 2023-09-18 MED ORDER — HYDROMORPHONE HCL 1 MG/ML IJ SOLN
1.0000 mg | INTRAMUSCULAR | Status: AC | PRN
  Administered 2023-09-18: 1 mg via INTRAVENOUS
  Filled 2023-09-18: qty 1

## 2023-09-18 MED ORDER — HYDROMORPHONE HCL 1 MG/ML IJ SOLN: 1.0000 mg | INTRAMUSCULAR | Status: DC | PRN

## 2023-09-18 NOTE — Evaluation (Signed)
Physical Therapy Evaluation Patient Details Name: Renee Watts MRN: [id number] DOB: June 16, 1951 Today's Date: 09/18/2023  History of Present Illness  Pt is a 73 y/o female presenting on 1/21 for same day L 3/4 laminectomy and PSIF. PMH includes: DM, depression/anxiety, R THA, Bil TKA, bladder surgery.   Clinical Impression  Renee Watts is a 73 y.o. female POD 1 s/p L3-4 lami and PSIF. Patient reports independent with no AD for  mobility at baseline. Patient is now limited by functional impairments (see PT problem list below) and requires min-mod assist for bed mobility and min assist for transfers and gait with RW. Patient was able to ambulate ~110 feet with RW and min assist fading to CGA with cues for safe walker management. Pt educated on post-operative spinal precautions, "BLT" and verbalized understanding. Patient will benefit from continued skilled PT interventions to address impairments and progress towards PLOF. Will continue to progress pt as able during acute stay.         If plan is discharge home, recommend the following: A little help with walking and/or transfers;A little help with bathing/dressing/bathroom;Assistance with cooking/housework;Direct supervision/assist for medications management;Assist for transportation;Help with stairs or ramp for entrance   Can travel by private vehicle        Equipment Recommendations BSC/3in1  Recommendations for Other Services       Functional Status Assessment Patient has had a recent decline in their functional status and demonstrates the ability to make significant improvements in function in a reasonable and predictable amount of time.     Precautions / Restrictions Precautions Precautions: Back Precaution Booklet Issued: Yes (comment) Precaution Comments: reviewed with pt, 2 drains from surgery site Required Braces or Orthoses:  (no brace needed order) Restrictions Weight Bearing Restrictions Per Provider Order: No       Mobility  Bed Mobility Overal bed mobility: Needs Assistance Bed Mobility: Rolling, Sidelying to Sit, Sit to Sidelying Rolling: Min assist, Used rails Sidelying to sit: Min assist, Used rails, HOB elevated     Sit to sidelying: Mod assist, Used rails, HOB elevated General bed mobility comments: cues for log roll technique to maintain spinal precautions. min assist to roll and cues to initaite reach to bed rail and assist to flex knees. Mod assist to return to supine and bring bil LE's onto bed at EOS.    Transfers Overall transfer level: Needs assistance Equipment used: Rolling walker (2 wheels) Transfers: Sit to/from Stand Sit to Stand: Mod assist, Min assist, From elevated surface           General transfer comment: Mod assist for sit<>stand from soft EOB despite height elevated. Min assist for sit<>stand from elevated BSC (in bathroom) with pt reliant on bil UE to initiate power up and control lowering to sit.    Ambulation/Gait Ambulation/Gait assistance: Min assist, Contact guard assist Gait Distance (Feet): 110 Feet Assistive device: Rolling walker (2 wheels) Gait Pattern/deviations: Step-through pattern, Decreased step length - right, Decreased step length - left, Decreased stride length, Decreased dorsiflexion - right Gait velocity: decr     General Gait Details: Min assist for safety with cues for RW management. slight steppage on Rt LE and extra hip flexion to clear Rt toes with swing through. no LOB noted throughout and VSS.  Stairs            Wheelchair Mobility     Tilt Bed    Modified Rankin (Stroke Patients Only)       Balance Overall balance assessment:  Needs assistance Sitting-balance support: No upper extremity supported, Feet supported Sitting balance-Leahy Scale: Fair Sitting balance - Comments: preference to UE support due to pain   Standing balance support: Bilateral upper extremity supported, During functional  activity Standing balance-Leahy Scale: Poor Standing balance comment: relies on RW                             Pertinent Vitals/Pain Pain Assessment Pain Assessment: Faces Faces Pain Scale: Hurts little more Pain Location: back Pain Descriptors / Indicators: Discomfort, Grimacing, Operative site guarding Pain Intervention(s): Limited activity within patient's tolerance, Monitored during session, Repositioned, Premedicated before session, Ice applied    Home Living Family/patient expects to be discharged to:: Private residence Living Arrangements: Spouse/significant other (granddaughter) Available Help at Discharge: Family;Available 24 hours/day Type of Home: House Home Access: Stairs to enter Entrance Stairs-Rails: Right (with the wall on the L) Entrance Stairs-Number of Steps: 2   Home Layout: One level Home Equipment: Grab bars - tub/shower;Rolling Walker (2 wheels);Cane - single point;Adaptive equipment Additional Comments: husband retired and pt retired, pt's 2 daughters working but available to assist    Prior Function Prior Level of Function : Independent/Modified Independent             Mobility Comments: no AD ADLs Comments: indepedent ADLs but needing some assist for R sock/shoe at times, light IADLs (mostly sitting on stool), not driving     Extremity/Trunk Assessment   Upper Extremity Assessment Upper Extremity Assessment: Defer to OT evaluation;Overall [facility] for tasks assessed    Lower Extremity Assessment Lower Extremity Assessment: RLE deficits/detail RLE Deficits / Details: grossly 3+/5 for knee ext, and Plantar flexion, 3/5 for knee flex, 3-/5 for dorsiflexion.    Cervical / Trunk Assessment Cervical / Trunk Assessment: Back Surgery  Communication   Communication Communication: No apparent difficulties  Cognition Arousal: Alert Behavior During Therapy: WFL for tasks assessed/performed Overall Cognitive Status: Within Functional Limits  for tasks assessed                                          General Comments      Exercises     Assessment/Plan    PT Assessment Patient needs continued PT services  PT Problem List Decreased strength;Decreased activity tolerance;Decreased range of motion;Decreased balance;Decreased mobility;Decreased knowledge of use of DME;Decreased safety awareness;Decreased knowledge of precautions;Pain       PT Treatment Interventions DME instruction;Gait training;Stair training;Functional mobility training;Therapeutic activities;Therapeutic exercise;Balance training;Neuromuscular re-education;Cognitive remediation;Patient/family education;Wheelchair mobility training;Modalities    PT Goals (Current goals can be found in the Care Plan section)  Acute Rehab PT Goals Patient Stated Goal: regain independence and enjoy the things she wants to do once back recovered PT Goal Formulation: With patient Time For Goal Achievement: 10/02/23 Potential to Achieve Goals: Good    Frequency Min 1X/week     Co-evaluation               AM-PAC PT "6 Clicks" Mobility  Outcome Measure Help needed turning from your back to your side while in a flat bed without using bedrails?: A Little Help needed moving from lying on your back to sitting on the side of a flat bed without using bedrails?: A Little Help needed moving to and from a bed to a chair (including a wheelchair)?: A Little Help needed standing up from a  chair using your arms (e.g., wheelchair or bedside chair)?: A Little Help needed to walk in hospital room?: A Little Help needed climbing 3-5 steps with a railing? : A Lot 6 Click Score: 17    End of Session Equipment Utilized During Treatment: Gait belt Activity Tolerance: Patient tolerated treatment well Patient left: in bed;with call bell/phone within reach;with bed alarm set Nurse Communication: Mobility status PT Visit Diagnosis: Other abnormalities of gait and mobility  (R26.89);Muscle weakness (generalized) (M62.81);Difficulty in walking, not elsewhere classified (R26.2);Other symptoms and signs involving the nervous system (R29.898);Pain Pain - part of body:  (back)    Time: 0981-1914 PT Time Calculation (min) (ACUTE ONLY): 37 min   Charges:   PT Evaluation $PT Eval Low Complexity: 1 Low PT Treatments $Gait Training: 8-22 mins PT General Charges $$ ACUTE PT VISIT: 1 Visit         Wynn Maudlin, DPT Acute Rehabilitation Services Office [phone]  09/18/23 5:03 PM

## 2023-09-18 NOTE — Progress Notes (Signed)
    Durable Medical Equipment  (From admission, onward)           Start     Ordered   09/18/23 1623  For home use only DME Bedside commode  Once       Comments: Confine to one room  Question:  Patient needs a bedside commode to treat with the following condition  Answer:  Gait instability   09/18/23 1624

## 2023-09-18 NOTE — Plan of Care (Signed)
  Problem: Education: Goal: Knowledge of General Education information will improve Description: Including pain rating scale, medication(s)/side effects and non-pharmacologic comfort measures Outcome: Progressing   Problem: Health Behavior/Discharge Planning: Goal: Ability to manage health-related needs will improve Outcome: Progressing   Problem: Clinical Measurements: Goal: Ability to maintain clinical measurements within normal limits will improve Outcome: Progressing Goal: Will remain free from infection Outcome: Progressing Goal: Diagnostic test results will improve Outcome: Progressing Goal: Respiratory complications will improve Outcome: Progressing Goal: Cardiovascular complication will be avoided Outcome: Progressing   Problem: Activity: Goal: Risk for activity intolerance will decrease Outcome: Progressing   Problem: Nutrition: Goal: Adequate nutrition will be maintained Outcome: Progressing   Problem: Coping: Goal: Level of anxiety will decrease Outcome: Progressing   Problem: Elimination: Goal: Will not experience complications related to bowel motility Outcome: Progressing Goal: Will not experience complications related to urinary retention Outcome: Progressing   Problem: Pain Managment: Goal: General experience of comfort will improve and/or be controlled Outcome: Progressing   Problem: Safety: Goal: Ability to remain free from injury will improve Outcome: Progressing   Problem: Skin Integrity: Goal: Risk for impaired skin integrity will decrease Outcome: Progressing   Problem: Education: Goal: Ability to describe self-care measures that may prevent or decrease complications (Diabetes Survival Skills Education) will improve Outcome: Progressing Goal: Individualized Educational Video(s) Outcome: Progressing   Problem: Coping: Goal: Ability to adjust to condition or change in health will improve Outcome: Progressing   Problem: Fluid  Volume: Goal: Ability to maintain a balanced intake and output will improve Outcome: Progressing   Problem: Health Behavior/Discharge Planning: Goal: Ability to identify and utilize available resources and services will improve Outcome: Progressing Goal: Ability to manage health-related needs will improve Outcome: Progressing   Problem: Metabolic: Goal: Ability to maintain appropriate glucose levels will improve Outcome: Progressing   Problem: Nutritional: Goal: Maintenance of adequate nutrition will improve Outcome: Progressing Goal: Progress toward achieving an optimal weight will improve Outcome: Progressing   Problem: Skin Integrity: Goal: Risk for impaired skin integrity will decrease Outcome: Progressing   Problem: Tissue Perfusion: Goal: Adequacy of tissue perfusion will improve Outcome: Progressing   Problem: Education: Goal: Knowledge of General Education information will improve Description: Including pain rating scale, medication(s)/side effects and non-pharmacologic comfort measures Outcome: Progressing   Problem: Health Behavior/Discharge Planning: Goal: Ability to manage health-related needs will improve Outcome: Progressing   Problem: Clinical Measurements: Goal: Ability to maintain clinical measurements within normal limits will improve Outcome: Progressing Goal: Will remain free from infection Outcome: Progressing Goal: Diagnostic test results will improve Outcome: Progressing Goal: Respiratory complications will improve Outcome: Progressing Goal: Cardiovascular complication will be avoided Outcome: Progressing   Problem: Activity: Goal: Risk for activity intolerance will decrease Outcome: Progressing   Problem: Nutrition: Goal: Adequate nutrition will be maintained Outcome: Progressing   Problem: Coping: Goal: Level of anxiety will decrease Outcome: Progressing   Problem: Elimination: Goal: Will not experience complications related to  bowel motility Outcome: Progressing Goal: Will not experience complications related to urinary retention Outcome: Progressing   Problem: Pain Managment: Goal: General experience of comfort will improve and/or be controlled Outcome: Progressing   Problem: Safety: Goal: Ability to remain free from injury will improve Outcome: Progressing   Problem: Skin Integrity: Goal: Risk for impaired skin integrity will decrease Outcome: Progressing

## 2023-09-18 NOTE — Evaluation (Signed)
Occupational Therapy Evaluation Patient Details Name: Renee Watts MRN: [id number] DOB: [birth year] Today's Date: 09/18/2023   History of Present Illness Pt is a 73 y/o female presenting on 1/21 for same day L 3/4 laminectomy and PSIF. PMH includes: DM, depression/anxiety, R THA, Bil TKA, bladder surgery.   Clinical Impression   PTA patient independent with ADLs, but at times needing assist for R sock/shoe; completes IADLs in sitting but not using AD for mobility. Admitted for above and presents with problem list below.  Patient requires min to min guard assist for functional mobility using RW, up to max assist for LB Adls.  Limited by pain this session, provided ice and informed RN.  Will follow acutely, anticipate she will progress well as pain improves therefore no further OT recommended after dc home.        If plan is discharge home, recommend the following: A little help with walking and/or transfers;A lot of help with bathing/dressing/bathroom;Assistance with cooking/housework;Assist for transportation;Help with stairs or ramp for entrance    Functional Status Assessment  Patient has had a recent decline in their functional status and demonstrates the ability to make significant improvements in function in a reasonable and predictable amount of time.  Equipment Recommendations  BSC/3in1    Recommendations for Other Services       Precautions / Restrictions Precautions Precautions: Back Precaution Booklet Issued: Yes (comment) Precaution Comments: reviewed with pt, 2 drains from surgery site Required Braces or Orthoses:  (no brace needed order) Restrictions Weight Bearing Restrictions Per Provider Order: No      Mobility Bed Mobility               General bed mobility comments: OOB upon entry    Transfers Overall transfer level: Needs assistance                 General transfer comment: pt exiting bathroom with RN family member upon entry, assist to  sit in recliner with min assist to control eccentric descend.   PT declined to stand again due to pain but able to scoot self back in chair with supervision given increased time.      Balance Overall balance assessment: Needs assistance Sitting-balance support: No upper extremity supported, Feet supported Sitting balance-Leahy Scale: Fair Sitting balance - Comments: preference to UE support due to pain   Standing balance support: Bilateral upper extremity supported, During functional activity Standing balance-Leahy Scale: Poor Standing balance comment: relies on RW                           ADL either performed or assessed with clinical judgement   ADL Overall ADL's : Needs assistance/impaired     Grooming: Set up;Sitting           Upper Body Dressing : Minimal assistance;Sitting   Lower Body Dressing: Maximal assistance;Sit to/from stand Lower Body Dressing Details (indicate cue type and reason): requires assist for B socks, unable to complete figure 4 technique; relies on UE support in standing Toilet Transfer: Minimal assistance;Ambulation Toilet Transfer Details (indicate cue type and reason): pt exiting bathroom upon therapist entry, min assist to safely sit in recliner with eccentric control         Functional mobility during ADLs: Contact guard assist;Rolling walker (2 wheels)       Vision   Vision Assessment?: No apparent visual deficits     Perception         Praxis  Pertinent Vitals/Pain Pain Assessment Pain Assessment: 0-10 Pain Score: 9  Pain Location: back Pain Descriptors / Indicators: Discomfort, Grimacing, Operative site guarding Pain Intervention(s): Limited activity within patient's tolerance, Monitored during session, Repositioned, Patient requesting pain meds-RN notified, Ice applied     Extremity/Trunk Assessment Upper Extremity Assessment Upper Extremity Assessment: Overall WFL for tasks assessed   Lower Extremity  Assessment Lower Extremity Assessment: Defer to PT evaluation   Cervical / Trunk Assessment Cervical / Trunk Assessment: Back Surgery   Communication Communication Communication: No apparent difficulties   Cognition Arousal: Alert Behavior During Therapy: WFL for tasks assessed/performed Overall Cognitive Status: Within Functional Limits for tasks assessed                                       General Comments  family present and supportive, notified RN of request for pain meds.  Drains and dressing intact to back.    Exercises     Shoulder Instructions      Home Living Family/patient expects to be discharged to:: Private residence Living Arrangements: Spouse/significant other (granddaughter) Available Help at Discharge: Family;Available 24 hours/day Type of Home: House Home Access: Stairs to enter [employer] of Steps: 2 Entrance Stairs-Rails: Right (with the wall on the L) Home Layout: One level     Bathroom Shower/Tub: Producer, television/film/video: Handicapped height     Home Equipment: Grab bars - tub/shower;Rolling Environmental consultant (2 wheels);Cane - single point;Adaptive equipment Adaptive Equipment: Reacher        Prior Functioning/Environment Prior Level of Function : Independent/Modified Independent             Mobility Comments: no AD ADLs Comments: indepedent ADLs but needing some assist for R sock/shoe at times, light IADLs (mostly sitting on stool), not driving        OT Problem List: Decreased strength;Decreased activity tolerance;Impaired balance (sitting and/or standing);Pain;Decreased knowledge of precautions;Decreased knowledge of use of DME or AE      OT Treatment/Interventions: Self-care/ADL training;Therapeutic exercise;DME and/or AE instruction;Therapeutic activities;Patient/family education;Balance training    OT Goals(Current goals can be found in the care plan section) Acute Rehab OT Goals Patient Stated Goal:  less pain OT Goal Formulation: With patient Time For Goal Achievement: 10/02/23 Potential to Achieve Goals: Good  OT Frequency: Min 1X/week    Co-evaluation              AM-PAC OT "6 Clicks" Daily Activity     Outcome Measure Help from another person eating meals?: None Help from another person taking care of personal grooming?: A Little Help from another person toileting, which includes using toliet, bedpan, or urinal?: A Lot Help from another person bathing (including washing, rinsing, drying)?: A Lot Help from another person to put on and taking off regular upper body clothing?: A Little Help from another person to put on and taking off regular lower body clothing?: A Lot 6 Click Score: 16   End of Session Equipment Utilized During Treatment: Rolling walker (2 wheels) Nurse Communication: Mobility status;Patient requests pain meds  Activity Tolerance: Patient limited by pain Patient left: in chair;with call bell/phone within reach;with family/visitor present  OT Visit Diagnosis: Other abnormalities of gait and mobility (R26.89);Pain Pain - part of body:  (back)                Time: 1610-9604 OT Time Calculation (min): 22 min Charges:  OT General  Charges $OT Visit: 1 Visit OT Evaluation $OT Eval Moderate Complexity: 1 Mod  Barry Brunner, OT Acute Rehabilitation Services Office [phone]   Chancy Milroy 09/18/2023, 9:51 AM

## 2023-09-18 NOTE — Care Management Obs Status (Signed)
MEDICARE OBSERVATION STATUS NOTIFICATION   Patient Details  Name: Renee Watts MRN: [id number] Date of Birth: 05-27-1951   Medicare Observation Status Notification Given:  Yes    Lorri Frederick, LCSW 09/18/2023, 11:18 AM

## 2023-09-18 NOTE — Discharge Instructions (Signed)
Orthopedic Surgery Discharge Instructions  Patient name: Renee Watts Procedure Performed: L3/4 laminectomy and posterior instrumented spinal fusion Date of Surgery: 09/17/2023 Surgeon: Willia Craze, MD  Pre-operative Diagnosis: lumbar radiculopathy, lumbar spondylolisthesis Post-operative Diagnosis: same as above  Discharged to: home Discharge Condition: stable   Activity: You should refrain from bending, lifting, or twisting with objects greater than ten pounds until three months after surgery. You are encouraged to walk as much as desired. You can perform household activities such as cleaning dishes, doing laundry, vacuuming, etc. as long as the ten-pound restriction is followed. You do not need to wear a brace during the post-operative period.   Incision Care: Your incision site has a dressing over it. That dressing should remain in place and dry at all times for a total of one week after surgery. After one week, you can remove the dressing. Underneath the dressing, you will find pieces of tape. You should leave these pieces of tape in place. They will fall off with time. Do not pick, rub, or scrub at them. Do not put cream or lotion over the surgical area. After one week and once the dressing is off, it is okay to let soap and water run over your incision. Again, do not pick, scrub, or rub at the pieces of tape when bathing. Do not submerge (e.g., take a bath, swim, go in a hot tub, etc.) until six weeks after surgery. There may be some bloody drainage from the incision into the dressing after surgery. This is normal. You do not need to replace the dressing. Continue to leave it in place for the one week as instructed above. Should the dressing become saturated with blood or drainage, please call the office for further instructions.   Medications: You have been prescribed oxycodone. This is a narcotic pain medication and should only be taken as prescribed. You should not drink alcohol or  operate heavy machinery (including driving) while taking this medication. The oxycodone can cause constipation as a side effect. For that reason, you have been prescribed senna and miralax. These are both laxatives. You do not need to take this medication if you develop diarrhea. Should you remain constipated even while taking these medications, please increase the dose of miralax to twice daily. Tylenol has been prescribed to be taken every 8 hours, which will give you additional pain relief. Robaxin is a muscle relaxer that has been prescribed to you for muscle spasm type pain. Take this medication as needed. Zofran has been prescribed to help with any nausea in the post-operative period. Take this medication as needed.   Do not take NSAIDs (ibuprofen, Aleve, Celebrex, naproxen, meloxicam, etc.) for the first 6 weeks after surgery as there is some evidence that their use may decrease the chances of successful fusion.   In order to set expectations for opioid prescriptions, you will only be prescribed opioids for a total of six weeks after surgery and, at two-weeks after surgery, your opioid prescription will start to tapered (decreased dosage and number of pills). If you have ongoing need for opioid medication six weeks after surgery, you will be referred to pain management. If you are already established with a provider that is giving you opioid medications, you should schedule an appointment with them for six weeks after surgery if you feel you are going to need another prescription. State law only allows for opioid prescriptions one week at a time. If you are running out of opioid medication near the end of  the week, please call the office during business hours before running out so I can send you another prescription.   You may resume any home blood thinners (warfarin, lovenox, apixaban, plavix, xarelto, etc) 72 hours after your surgery. Take these medications as they were previously  prescribed.  Driving: You should not drive while taking narcotic pain medications. You should start getting back to driving slowly and you may want to try driving in a parking lot before doing anything more.   Diet: You are safe to resume your regular diet after surgery.   Reasons to Call the Office After Surgery: You should feel free to call the office with any concerns or questions you have in the post-operative period, but you should definitely notify the office if you develop: -shortness of breath, chest pain, or trouble breathing -excessive bleeding, drainage, redness, or swelling around the surgical site -fevers, chills, or pain that is getting worse with each passing day -persistent nausea or vomiting -new weakness in either leg -new or worsening numbness or tingling in either leg -numbness in the groin, bowel or bladder incontinence -other concerns about your surgery  Follow Up Appointments: You should have an office appointment scheduled for approximately two weeks after surgery. If you do not remember when this appointment is or do not already have it scheduled, please call the office to schedule.   Office Information:  -Willia Craze, MD -Phone number: [phone] -Address: [address]       [place], [place] [address]

## 2023-09-18 NOTE — Inpatient Diabetes Management (Signed)
Inpatient Diabetes Program Recommendations  AACE/ADA: New Consensus Statement on Inpatient Glycemic Control (2015)  Target Ranges:  Prepandial:   less than 140 mg/dL      Peak postprandial:   less than 180 mg/dL (1-2 hours)      Critically ill patients:  140 - 180 mg/dL   Lab Results  Component Value Date   GLUCAP 199 (H) 09/18/2023   HGBA1C 6.5 (H) 09/11/2023    Review of Glycemic Control  Diabetes history: DM 2 Outpatient Diabetes medications: Metformin 750 mg Daily Current orders for Inpatient glycemic control:  Metformin 750 mg Daily Novolog 0-15 units tid + hs  A1c 6.5% on 1/15 Received Decadron 4 mg on 1/21  Glucose trends elevated after steroid use, no more steroids ordered. Glucose should continue to trends downward.  Thanks,  Christena Deem RN, MSN, BC-ADM Inpatient Diabetes Coordinator Team Pager [phone] (8a-5p)

## 2023-09-18 NOTE — Progress Notes (Addendum)
 Orthopedic Surgery Post-operative Progress Note  Assessment: Patient is a 73 y.o. female who is currently admitted after undergoing L3/4 laminectomy and PSIF   Plan: -Operative plans complete -Needs upright films when able -Drain to be maintained until output slows -Out of bed as tolerated, no brace -No bending/lifting/twisting greater than 10 pounds -OT evaluate and treat -Pain control -Diabetic diet -No chemoprophylaxis for dvt or antiplatelets for 72 hours after surgery -Ancef x2 post-operative doses -Disposition: remain floor status  ___________________________________________________________________________   Subjective: No acute events overnight. Pain well controlled. Having some back pain. States her legs are the best that they have felt in awhile. Denies paresthesias and numbness.   Objective:  General: no acute distress, appropriate affect Neurologic: alert, answering questions appropriately, following commands Respiratory: unlabored breathing on room air Skin: dressing clear/dry/intact, drains with serosanguinous output  MSK (spine):  -Strength exam      Right  Left  EHL    3/5  4/5 TA    3/5  4/5 GSC    4/5  4/5 Knee extension  5/5  5/5 Hip flexion   5/5  5/5  -Sensory exam    Sensation intact to light touch in L2-S1 nerve distributions of bilateral lower extremities   Patient name: Renee Watts Patient MRN: [id number] Date: 09/18/23

## 2023-09-18 NOTE — Discharge Summary (Signed)
Orthopedic Surgery Discharge Summary  Patient name: Renee Watts Patient MRN: [id number] Admit today: 09/17/2023 Discharge date: 09/20/2023  Attending physician: Willia Craze, MD Final diagnosis: lumbar radiculopathy Findings: spondylolisthesis at L3/4, hypertrophic facets at L3/4  Hospital course: Patient is a 73 y.o. female who was admitted after undergoing L3/4 laminectomy and posterior instrumented spinal fusion. The patient had significant pain immediately after surgery, but pain eventually was controlled with a multimodal regimen including oxycodone. Labs during the hospitalization revealed expected acute anemia from surgical blood loss. Her starting hemoglobin was 11.2 and dropped to 9.5 post-operatively. No intervention was performed and patient was asymptomatic. The patient worked with physical therapy who recommended discharge to home. The patient was tolerating an oral diet without issue and was voiding spontaneously after surgery. The patient's vitals were stable on the day of discharge. The patient's drains were removed on the day of discharge. The patient was medically ready for discharge and was discharge to home on post-operative day three.  Instructions:   Orthopedic Surgery Discharge Instructions  Patient name: [name] Procedure Performed: L3/4 laminectomy and posterior instrumented spinal fusion Date of Surgery: 09/17/2023 Surgeon: Willia Craze, MD  Pre-operative Diagnosis: lumbar radiculopathy, lumbar spondylolisthesis Post-operative Diagnosis: same as above  Discharged to: home Discharge Condition: stable   Activity: You should refrain from bending, lifting, or twisting with objects greater than ten pounds until three months after surgery. You are encouraged to walk as much as desired. You can perform household activities such as cleaning dishes, doing laundry, vacuuming, etc. as long as the ten-pound restriction is followed. You do not need to wear a  brace during the post-operative period.   Incision Care: Your incision site has a dressing over it. That dressing should remain in place and dry at all times for a total of one week after surgery. After one week, you can remove the dressing. Underneath the dressing, you will find pieces of tape. You should leave these pieces of tape in place. They will fall off with time. Do not pick, rub, or scrub at them. Do not put cream or lotion over the surgical area. After one week and once the dressing is off, it is okay to let soap and water run over your incision. Again, do not pick, scrub, or rub at the pieces of tape when bathing. Do not submerge (e.g., take a bath, swim, go in a hot tub, etc.) until six weeks after surgery. There may be some bloody drainage from the incision into the dressing after surgery. This is normal. You do not need to replace the dressing. Continue to leave it in place for the one week as instructed above. Should the dressing become saturated with blood or drainage, please call the office for further instructions.   Medications: You have been prescribed oxycodone. This is a narcotic pain medication and should only be taken as prescribed. You should not drink alcohol or operate heavy machinery (including driving) while taking this medication. The oxycodone can cause constipation as a side effect. For that reason, you have been prescribed senna and miralax. These are both laxatives. You do not need to take this medication if you develop diarrhea. Should you remain constipated even while taking these medications, please increase the dose of miralax to twice daily. Tylenol has been prescribed to be taken every 8 hours, which will give you additional pain relief. Robaxin is a muscle relaxer that has been prescribed to you for muscle spasm type pain. Take this medication as needed.  Zofran has been prescribed for nausea. Take this medication as needed.   Do not take NSAIDs (ibuprofen, Aleve,  Celebrex, naproxen, meloxicam, etc.) for the first 6 weeks after surgery as there is some evidence that their use may decrease the chances of successful fusion.   In order to set expectations for opioid prescriptions, you will only be prescribed opioids for a total of six weeks after surgery and, at two-weeks after surgery, your opioid prescription will start to tapered (decreased dosage and number of pills). If you have ongoing need for opioid medication six weeks after surgery, you will be referred to pain management. If you are already established with a provider that is giving you opioid medications, you should schedule an appointment with them for six weeks after surgery if you feel you are going to need another prescription. State law only allows for opioid prescriptions one week at a time. If you are running out of opioid medication near the end of the week, please call the office during business hours before running out so I can send you another prescription.   You may resume any home blood thinners (warfarin, lovenox, apixaban, plavix, xarelto, etc) 72 hours after your surgery. Take these medications as they were previously prescribed.  Driving: You should not drive while taking narcotic pain medications. You should start getting back to driving slowly and you may want to try driving in a parking lot before doing anything more.   Diet: You are safe to resume your regular diet after surgery.   Reasons to Call the Office After Surgery: You should feel free to call the office with any concerns or questions you have in the post-operative period, but you should definitely notify the office if you develop: -shortness of breath, chest pain, or trouble breathing -excessive bleeding, drainage, redness, or swelling around the surgical site -fevers, chills, or pain that is getting worse with each passing day -persistent nausea or vomiting -new weakness in either leg -new or worsening numbness or  tingling in either leg -numbness in the groin, bowel or bladder incontinence -other concerns about your surgery  Follow Up Appointments: You should have an office appointment scheduled for approximately two weeks after surgery. If you do not remember when this appointment is or do not already have it scheduled, please call the office to schedule.   Office Information:  -Willia Craze, MD -Phone number: [phone] -Address: [address]       [place], [place] [address]

## 2023-09-18 NOTE — Plan of Care (Signed)
Problem: Education: Goal: Knowledge of General Education information will improve Description: Including pain rating scale, medication(s)/side effects and non-pharmacologic comfort measures 09/18/2023 0309 by Tennis Ship, RN Outcome: Progressing 09/18/2023 0309 by Tennis Ship, RN Outcome: Progressing   Problem: Health Behavior/Discharge Planning: Goal: Ability to manage health-related needs will improve 09/18/2023 0309 by Tennis Ship, RN Outcome: Progressing 09/18/2023 0309 by Tennis Ship, RN Outcome: Progressing   Problem: Clinical Measurements: Goal: Ability to maintain clinical measurements within normal limits will improve 09/18/2023 0309 by Tennis Ship, RN Outcome: Progressing 09/18/2023 0309 by Tennis Ship, RN Outcome: Progressing Goal: Will remain free from infection 09/18/2023 0309 by Tennis Ship, RN Outcome: Progressing 09/18/2023 0309 by Tennis Ship, RN Outcome: Progressing Goal: Diagnostic test results will improve 09/18/2023 0309 by Tennis Ship, RN Outcome: Progressing 09/18/2023 0309 by Tennis Ship, RN Outcome: Progressing Goal: Respiratory complications will improve 09/18/2023 0309 by Tennis Ship, RN Outcome: Progressing 09/18/2023 0309 by Tennis Ship, RN Outcome: Progressing Goal: Cardiovascular complication will be avoided 09/18/2023 0309 by Tennis Ship, RN Outcome: Progressing 09/18/2023 0309 by Tennis Ship, RN Outcome: Progressing   Problem: Activity: Goal: Risk for activity intolerance will decrease 09/18/2023 0309 by Tennis Ship, RN Outcome: Progressing 09/18/2023 0309 by Tennis Ship, RN Outcome: Progressing   Problem: Nutrition: Goal: Adequate nutrition will be maintained 09/18/2023 0309 by Tennis Ship, RN Outcome: Progressing 09/18/2023 0309 by Tennis Ship, RN Outcome: Progressing   Problem: Coping: Goal: Level of anxiety will  decrease 09/18/2023 0309 by Tennis Ship, RN Outcome: Progressing 09/18/2023 0309 by Tennis Ship, RN Outcome: Progressing   Problem: Elimination: Goal: Will not experience complications related to bowel motility 09/18/2023 0309 by Tennis Ship, RN Outcome: Progressing 09/18/2023 0309 by Tennis Ship, RN Outcome: Progressing Goal: Will not experience complications related to urinary retention 09/18/2023 0309 by Tennis Ship, RN Outcome: Progressing 09/18/2023 0309 by Tennis Ship, RN Outcome: Progressing   Problem: Pain Managment: Goal: General experience of comfort will improve and/or be controlled 09/18/2023 0309 by Tennis Ship, RN Outcome: Progressing 09/18/2023 0309 by Tennis Ship, RN Outcome: Progressing   Problem: Safety: Goal: Ability to remain free from injury will improve 09/18/2023 0309 by Tennis Ship, RN Outcome: Progressing 09/18/2023 0309 by Tennis Ship, RN Outcome: Progressing   Problem: Skin Integrity: Goal: Risk for impaired skin integrity will decrease 09/18/2023 0309 by Tennis Ship, RN Outcome: Progressing 09/18/2023 0309 by Tennis Ship, RN Outcome: Progressing   Problem: Education: Goal: Ability to describe self-care measures that may prevent or decrease complications (Diabetes Survival Skills Education) will improve 09/18/2023 0309 by Tennis Ship, RN Outcome: Progressing 09/18/2023 0309 by Tennis Ship, RN Outcome: Progressing Goal: Individualized Educational Video(s) 09/18/2023 0309 by Tennis Ship, RN Outcome: Progressing 09/18/2023 0309 by Tennis Ship, RN Outcome: Progressing   Problem: Coping: Goal: Ability to adjust to condition or change in health will improve 09/18/2023 0309 by Tennis Ship, RN Outcome: Progressing 09/18/2023 0309 by Tennis Ship, RN Outcome: Progressing   Problem: Fluid Volume: Goal: Ability to maintain a balanced  intake and output will improve 09/18/2023 0309 by Tennis Ship, RN Outcome: Progressing 09/18/2023 0309 by Tennis Ship, RN Outcome: Progressing   Problem: Health Behavior/Discharge Planning: Goal: Ability to identify and utilize available resources and services will improve 09/18/2023 0309 by Tennis Ship, RN Outcome: Progressing 09/18/2023 0309 by  Tennis Ship, RN Outcome: Progressing Goal: Ability to manage health-related needs will improve 09/18/2023 0309 by Tennis Ship, RN Outcome: Progressing 09/18/2023 0309 by Tennis Ship, RN Outcome: Progressing   Problem: Metabolic: Goal: Ability to maintain appropriate glucose levels will improve 09/18/2023 0309 by Tennis Ship, RN Outcome: Progressing 09/18/2023 0309 by Tennis Ship, RN Outcome: Progressing   Problem: Nutritional: Goal: Maintenance of adequate nutrition will improve 09/18/2023 0309 by Tennis Ship, RN Outcome: Progressing 09/18/2023 0309 by Tennis Ship, RN Outcome: Progressing Goal: Progress toward achieving an optimal weight will improve 09/18/2023 0309 by Tennis Ship, RN Outcome: Progressing 09/18/2023 0309 by Tennis Ship, RN Outcome: Progressing   Problem: Skin Integrity: Goal: Risk for impaired skin integrity will decrease 09/18/2023 0309 by Tennis Ship, RN Outcome: Progressing 09/18/2023 0309 by Tennis Ship, RN Outcome: Progressing   Problem: Tissue Perfusion: Goal: Adequacy of tissue perfusion will improve 09/18/2023 0309 by Tennis Ship, RN Outcome: Progressing 09/18/2023 0309 by Tennis Ship, RN Outcome: Progressing   Problem: Education: Goal: Knowledge of General Education information will improve Description: Including pain rating scale, medication(s)/side effects and non-pharmacologic comfort measures 09/18/2023 0309 by Tennis Ship, RN Outcome: Progressing 09/18/2023 0309 by Tennis Ship,  RN Outcome: Progressing   Problem: Health Behavior/Discharge Planning: Goal: Ability to manage health-related needs will improve 09/18/2023 0309 by Tennis Ship, RN Outcome: Progressing 09/18/2023 0309 by Tennis Ship, RN Outcome: Progressing   Problem: Clinical Measurements: Goal: Ability to maintain clinical measurements within normal limits will improve 09/18/2023 0309 by Tennis Ship, RN Outcome: Progressing 09/18/2023 0309 by Tennis Ship, RN Outcome: Progressing Goal: Will remain free from infection 09/18/2023 0309 by Tennis Ship, RN Outcome: Progressing 09/18/2023 0309 by Tennis Ship, RN Outcome: Progressing Goal: Diagnostic test results will improve 09/18/2023 0309 by Tennis Ship, RN Outcome: Progressing 09/18/2023 0309 by Tennis Ship, RN Outcome: Progressing Goal: Respiratory complications will improve 09/18/2023 0309 by Tennis Ship, RN Outcome: Progressing 09/18/2023 0309 by Tennis Ship, RN Outcome: Progressing Goal: Cardiovascular complication will be avoided 09/18/2023 0309 by Tennis Ship, RN Outcome: Progressing 09/18/2023 0309 by Tennis Ship, RN Outcome: Progressing   Problem: Activity: Goal: Risk for activity intolerance will decrease 09/18/2023 0309 by Tennis Ship, RN Outcome: Progressing 09/18/2023 0309 by Tennis Ship, RN Outcome: Progressing   Problem: Nutrition: Goal: Adequate nutrition will be maintained 09/18/2023 0309 by Tennis Ship, RN Outcome: Progressing 09/18/2023 0309 by Tennis Ship, RN Outcome: Progressing   Problem: Coping: Goal: Level of anxiety will decrease 09/18/2023 0309 by Tennis Ship, RN Outcome: Progressing 09/18/2023 0309 by Tennis Ship, RN Outcome: Progressing   Problem: Elimination: Goal: Will not experience complications related to bowel motility 09/18/2023 0309 by Tennis Ship, RN Outcome:  Progressing 09/18/2023 0309 by Tennis Ship, RN Outcome: Progressing Goal: Will not experience complications related to urinary retention 09/18/2023 0309 by Tennis Ship, RN Outcome: Progressing 09/18/2023 0309 by Tennis Ship, RN Outcome: Progressing   Problem: Pain Managment: Goal: General experience of comfort will improve and/or be controlled 09/18/2023 0309 by Tennis Ship, RN Outcome: Progressing 09/18/2023 0309 by Tennis Ship, RN Outcome: Progressing   Problem: Safety: Goal: Ability to remain free from injury will improve 09/18/2023 0309 by Tennis Ship, RN Outcome: Progressing 09/18/2023 0309 by Tennis Ship, RN Outcome: Progressing   Problem: Skin Integrity: Goal: Risk for impaired  skin integrity will decrease 09/18/2023 0309 by Tennis Ship, RN Outcome: Progressing 09/18/2023 0309 by Tennis Ship, RN Outcome: Progressing

## 2023-09-18 NOTE — Plan of Care (Signed)
Problem: Education: Goal: Knowledge of General Education information will improve Description: Including pain rating scale, medication(s)/side effects and non-pharmacologic comfort measures 09/18/2023 0309 by Tennis Ship, RN Outcome: Progressing 09/18/2023 0309 by Tennis Ship, RN Outcome: Progressing 09/18/2023 0309 by Tennis Ship, RN Outcome: Progressing   Problem: Health Behavior/Discharge Planning: Goal: Ability to manage health-related needs will improve 09/18/2023 0309 by Tennis Ship, RN Outcome: Progressing 09/18/2023 0309 by Tennis Ship, RN Outcome: Progressing 09/18/2023 0309 by Tennis Ship, RN Outcome: Progressing   Problem: Clinical Measurements: Goal: Ability to maintain clinical measurements within normal limits will improve 09/18/2023 0309 by Tennis Ship, RN Outcome: Progressing 09/18/2023 0309 by Tennis Ship, RN Outcome: Progressing 09/18/2023 0309 by Tennis Ship, RN Outcome: Progressing Goal: Will remain free from infection 09/18/2023 0309 by Tennis Ship, RN Outcome: Progressing 09/18/2023 0309 by Tennis Ship, RN Outcome: Progressing 09/18/2023 0309 by Tennis Ship, RN Outcome: Progressing Goal: Diagnostic test results will improve 09/18/2023 0309 by Tennis Ship, RN Outcome: Progressing 09/18/2023 0309 by Tennis Ship, RN Outcome: Progressing 09/18/2023 0309 by Tennis Ship, RN Outcome: Progressing Goal: Respiratory complications will improve 09/18/2023 0309 by Tennis Ship, RN Outcome: Progressing 09/18/2023 0309 by Tennis Ship, RN Outcome: Progressing 09/18/2023 0309 by Tennis Ship, RN Outcome: Progressing Goal: Cardiovascular complication will be avoided 09/18/2023 0309 by Tennis Ship, RN Outcome: Progressing 09/18/2023 0309 by Tennis Ship, RN Outcome: Progressing 09/18/2023 0309 by Tennis Ship, RN Outcome: Progressing    Problem: Activity: Goal: Risk for activity intolerance will decrease 09/18/2023 0309 by Tennis Ship, RN Outcome: Progressing 09/18/2023 0309 by Tennis Ship, RN Outcome: Progressing 09/18/2023 0309 by Tennis Ship, RN Outcome: Progressing   Problem: Nutrition: Goal: Adequate nutrition will be maintained 09/18/2023 0309 by Tennis Ship, RN Outcome: Progressing 09/18/2023 0309 by Tennis Ship, RN Outcome: Progressing 09/18/2023 0309 by Tennis Ship, RN Outcome: Progressing   Problem: Coping: Goal: Level of anxiety will decrease 09/18/2023 0309 by Tennis Ship, RN Outcome: Progressing 09/18/2023 0309 by Tennis Ship, RN Outcome: Progressing 09/18/2023 0309 by Tennis Ship, RN Outcome: Progressing   Problem: Elimination: Goal: Will not experience complications related to bowel motility 09/18/2023 0309 by Tennis Ship, RN Outcome: Progressing 09/18/2023 0309 by Tennis Ship, RN Outcome: Progressing 09/18/2023 0309 by Tennis Ship, RN Outcome: Progressing Goal: Will not experience complications related to urinary retention 09/18/2023 0309 by Tennis Ship, RN Outcome: Progressing 09/18/2023 0309 by Tennis Ship, RN Outcome: Progressing 09/18/2023 0309 by Tennis Ship, RN Outcome: Progressing   Problem: Pain Managment: Goal: General experience of comfort will improve and/or be controlled 09/18/2023 0309 by Tennis Ship, RN Outcome: Progressing 09/18/2023 0309 by Tennis Ship, RN Outcome: Progressing 09/18/2023 0309 by Tennis Ship, RN Outcome: Progressing   Problem: Safety: Goal: Ability to remain free from injury will improve 09/18/2023 0309 by Tennis Ship, RN Outcome: Progressing 09/18/2023 0309 by Tennis Ship, RN Outcome: Progressing 09/18/2023 0309 by Tennis Ship, RN Outcome: Progressing   Problem: Skin Integrity: Goal: Risk for impaired skin  integrity will decrease 09/18/2023 0309 by Tennis Ship, RN Outcome: Progressing 09/18/2023 0309 by Tennis Ship, RN Outcome: Progressing 09/18/2023 0309 by Tennis Ship, RN Outcome: Progressing   Problem: Education: Goal: Ability to describe self-care measures that may prevent or decrease complications (Diabetes Survival Skills Education) will improve 09/18/2023 0309 by  Tennis Ship, RN Outcome: Progressing 09/18/2023 0309 by Tennis Ship, RN Outcome: Progressing 09/18/2023 0309 by Tennis Ship, RN Outcome: Progressing Goal: Individualized Educational Video(s) 09/18/2023 0309 by Tennis Ship, RN Outcome: Progressing 09/18/2023 0309 by Tennis Ship, RN Outcome: Progressing 09/18/2023 0309 by Tennis Ship, RN Outcome: Progressing   Problem: Coping: Goal: Ability to adjust to condition or change in health will improve 09/18/2023 0309 by Tennis Ship, RN Outcome: Progressing 09/18/2023 0309 by Tennis Ship, RN Outcome: Progressing 09/18/2023 0309 by Tennis Ship, RN Outcome: Progressing   Problem: Fluid Volume: Goal: Ability to maintain a balanced intake and output will improve 09/18/2023 0309 by Tennis Ship, RN Outcome: Progressing 09/18/2023 0309 by Tennis Ship, RN Outcome: Progressing 09/18/2023 0309 by Tennis Ship, RN Outcome: Progressing   Problem: Health Behavior/Discharge Planning: Goal: Ability to identify and utilize available resources and services will improve 09/18/2023 0309 by Tennis Ship, RN Outcome: Progressing 09/18/2023 0309 by Tennis Ship, RN Outcome: Progressing 09/18/2023 0309 by Tennis Ship, RN Outcome: Progressing Goal: Ability to manage health-related needs will improve 09/18/2023 0309 by Tennis Ship, RN Outcome: Progressing 09/18/2023 0309 by Tennis Ship, RN Outcome: Progressing 09/18/2023 0309 by Tennis Ship, RN Outcome:  Progressing   Problem: Metabolic: Goal: Ability to maintain appropriate glucose levels will improve 09/18/2023 0309 by Tennis Ship, RN Outcome: Progressing 09/18/2023 0309 by Tennis Ship, RN Outcome: Progressing 09/18/2023 0309 by Tennis Ship, RN Outcome: Progressing   Problem: Nutritional: Goal: Maintenance of adequate nutrition will improve 09/18/2023 0309 by Tennis Ship, RN Outcome: Progressing 09/18/2023 0309 by Tennis Ship, RN Outcome: Progressing 09/18/2023 0309 by Tennis Ship, RN Outcome: Progressing Goal: Progress toward achieving an optimal weight will improve 09/18/2023 0309 by Tennis Ship, RN Outcome: Progressing 09/18/2023 0309 by Tennis Ship, RN Outcome: Progressing 09/18/2023 0309 by Tennis Ship, RN Outcome: Progressing   Problem: Skin Integrity: Goal: Risk for impaired skin integrity will decrease 09/18/2023 0309 by Tennis Ship, RN Outcome: Progressing 09/18/2023 0309 by Tennis Ship, RN Outcome: Progressing 09/18/2023 0309 by Tennis Ship, RN Outcome: Progressing   Problem: Tissue Perfusion: Goal: Adequacy of tissue perfusion will improve 09/18/2023 0309 by Tennis Ship, RN Outcome: Progressing 09/18/2023 0309 by Tennis Ship, RN Outcome: Progressing 09/18/2023 0309 by Tennis Ship, RN Outcome: Progressing   Problem: Education: Goal: Knowledge of General Education information will improve Description: Including pain rating scale, medication(s)/side effects and non-pharmacologic comfort measures 09/18/2023 0309 by Tennis Ship, RN Outcome: Progressing 09/18/2023 0309 by Tennis Ship, RN Outcome: Progressing 09/18/2023 0309 by Tennis Ship, RN Outcome: Progressing   Problem: Health Behavior/Discharge Planning: Goal: Ability to manage health-related needs will improve 09/18/2023 0309 by Tennis Ship, RN Outcome: Progressing 09/18/2023 0309 by  Tennis Ship, RN Outcome: Progressing 09/18/2023 0309 by Tennis Ship, RN Outcome: Progressing   Problem: Clinical Measurements: Goal: Ability to maintain clinical measurements within normal limits will improve 09/18/2023 0309 by Tennis Ship, RN Outcome: Progressing 09/18/2023 0309 by Tennis Ship, RN Outcome: Progressing 09/18/2023 0309 by Tennis Ship, RN Outcome: Progressing Goal: Will remain free from infection 09/18/2023 0309 by Tennis Ship, RN Outcome: Progressing 09/18/2023 0309 by Tennis Ship, RN Outcome: Progressing 09/18/2023 0309 by Tennis Ship, RN Outcome: Progressing Goal: Diagnostic test results will improve 09/18/2023 0309 by Tennis Ship, RN Outcome: Progressing 09/18/2023 0309  by Tennis Ship, RN Outcome: Progressing 09/18/2023 0309 by Tennis Ship, RN Outcome: Progressing Goal: Respiratory complications will improve 09/18/2023 0309 by Tennis Ship, RN Outcome: Progressing 09/18/2023 0309 by Tennis Ship, RN Outcome: Progressing 09/18/2023 0309 by Tennis Ship, RN Outcome: Progressing Goal: Cardiovascular complication will be avoided 09/18/2023 0309 by Tennis Ship, RN Outcome: Progressing 09/18/2023 0309 by Tennis Ship, RN Outcome: Progressing 09/18/2023 0309 by Tennis Ship, RN Outcome: Progressing   Problem: Activity: Goal: Risk for activity intolerance will decrease 09/18/2023 0309 by Tennis Ship, RN Outcome: Progressing 09/18/2023 0309 by Tennis Ship, RN Outcome: Progressing 09/18/2023 0309 by Tennis Ship, RN Outcome: Progressing   Problem: Nutrition: Goal: Adequate nutrition will be maintained 09/18/2023 0309 by Tennis Ship, RN Outcome: Progressing 09/18/2023 0309 by Tennis Ship, RN Outcome: Progressing 09/18/2023 0309 by Tennis Ship, RN Outcome: Progressing   Problem: Coping: Goal: Level of anxiety will  decrease 09/18/2023 0309 by Tennis Ship, RN Outcome: Progressing 09/18/2023 0309 by Tennis Ship, RN Outcome: Progressing 09/18/2023 0309 by Tennis Ship, RN Outcome: Progressing   Problem: Elimination: Goal: Will not experience complications related to bowel motility 09/18/2023 0309 by Tennis Ship, RN Outcome: Progressing 09/18/2023 0309 by Tennis Ship, RN Outcome: Progressing 09/18/2023 0309 by Tennis Ship, RN Outcome: Progressing Goal: Will not experience complications related to urinary retention 09/18/2023 0309 by Tennis Ship, RN Outcome: Progressing 09/18/2023 0309 by Tennis Ship, RN Outcome: Progressing 09/18/2023 0309 by Tennis Ship, RN Outcome: Progressing   Problem: Pain Managment: Goal: General experience of comfort will improve and/or be controlled 09/18/2023 0309 by Tennis Ship, RN Outcome: Progressing 09/18/2023 0309 by Tennis Ship, RN Outcome: Progressing 09/18/2023 0309 by Tennis Ship, RN Outcome: Progressing   Problem: Safety: Goal: Ability to remain free from injury will improve 09/18/2023 0309 by Tennis Ship, RN Outcome: Progressing 09/18/2023 0309 by Tennis Ship, RN Outcome: Progressing 09/18/2023 0309 by Tennis Ship, RN Outcome: Progressing   Problem: Skin Integrity: Goal: Risk for impaired skin integrity will decrease 09/18/2023 0309 by Tennis Ship, RN Outcome: Progressing 09/18/2023 0309 by Tennis Ship, RN Outcome: Progressing 09/18/2023 0309 by Tennis Ship, RN Outcome: Progressing

## 2023-09-19 ENCOUNTER — Telehealth: Payer: Self-pay

## 2023-09-19 DIAGNOSIS — Z96653 Presence of artificial knee joint, bilateral: Secondary | ICD-10-CM | POA: Diagnosis not present

## 2023-09-19 DIAGNOSIS — Z79899 Other long term (current) drug therapy: Secondary | ICD-10-CM | POA: Diagnosis not present

## 2023-09-19 DIAGNOSIS — F419 Anxiety disorder, unspecified: Secondary | ICD-10-CM | POA: Diagnosis not present

## 2023-09-19 DIAGNOSIS — M5416 Radiculopathy, lumbar region: Secondary | ICD-10-CM | POA: Diagnosis present

## 2023-09-19 DIAGNOSIS — M4726 Other spondylosis with radiculopathy, lumbar region: Secondary | ICD-10-CM | POA: Diagnosis not present

## 2023-09-19 DIAGNOSIS — K219 Gastro-esophageal reflux disease without esophagitis: Secondary | ICD-10-CM | POA: Diagnosis not present

## 2023-09-19 DIAGNOSIS — E785 Hyperlipidemia, unspecified: Secondary | ICD-10-CM | POA: Diagnosis not present

## 2023-09-19 DIAGNOSIS — Z90710 Acquired absence of both cervix and uterus: Secondary | ICD-10-CM | POA: Diagnosis not present

## 2023-09-19 DIAGNOSIS — E119 Type 2 diabetes mellitus without complications: Secondary | ICD-10-CM | POA: Diagnosis not present

## 2023-09-19 DIAGNOSIS — Z96641 Presence of right artificial hip joint: Secondary | ICD-10-CM | POA: Diagnosis not present

## 2023-09-19 DIAGNOSIS — F32A Depression, unspecified: Secondary | ICD-10-CM | POA: Diagnosis not present

## 2023-09-19 DIAGNOSIS — D62 Acute posthemorrhagic anemia: Secondary | ICD-10-CM | POA: Diagnosis not present

## 2023-09-19 DIAGNOSIS — M4316 Spondylolisthesis, lumbar region: Secondary | ICD-10-CM | POA: Diagnosis not present

## 2023-09-19 DIAGNOSIS — M48061 Spinal stenosis, lumbar region without neurogenic claudication: Secondary | ICD-10-CM | POA: Diagnosis not present

## 2023-09-19 DIAGNOSIS — Z9889 Other specified postprocedural states: Secondary | ICD-10-CM | POA: Diagnosis not present

## 2023-09-19 LAB — GLUCOSE, CAPILLARY
Glucose-Capillary: 111 mg/dL — ABNORMAL HIGH (ref 70–99)
Glucose-Capillary: 173 mg/dL — ABNORMAL HIGH (ref 70–99)
Glucose-Capillary: 165 mg/dL — ABNORMAL HIGH (ref 70–99)
Glucose-Capillary: 176 mg/dL — ABNORMAL HIGH (ref 70–99)

## 2023-09-19 MED ORDER — METHOCARBAMOL 500 MG PO TABS
500.0000 mg | ORAL_TABLET | Freq: Three times a day (TID) | ORAL | Status: DC
  Administered 2023-09-19 – 2023-09-20 (×5): 500 mg via ORAL
  Filled 2023-09-19 (×5): qty 1

## 2023-09-19 MED ORDER — HYDROMORPHONE HCL 1 MG/ML IJ SOLN: 1.0000 mg | INTRAMUSCULAR | Status: DC | PRN

## 2023-09-19 MED ORDER — OXYCODONE HCL 5 MG PO TABS: 5.0000 mg | ORAL_TABLET | ORAL | 0 refills | Status: AC | PRN

## 2023-09-19 MED ORDER — SENNA 8.6 MG PO TABS: 1.0000 | ORAL_TABLET | Freq: Two times a day (BID) | ORAL | 0 refills | Status: AC

## 2023-09-19 MED ORDER — METHOCARBAMOL 750 MG PO TABS: 750.0000 mg | ORAL_TABLET | Freq: Four times a day (QID) | ORAL | 0 refills | Status: DC | PRN

## 2023-09-19 MED ORDER — POLYETHYLENE GLYCOL 3350 17 G PO PACK: 17.0000 g | PACK | Freq: Every day | ORAL | 0 refills | Status: AC

## 2023-09-19 MED ORDER — HYDROMORPHONE HCL 1 MG/ML IJ SOLN
1.0000 mg | INTRAMUSCULAR | Status: AC | PRN
  Administered 2023-09-19: 1 mg via INTRAVENOUS
  Filled 2023-09-19: qty 1

## 2023-09-19 MED ORDER — ONDANSETRON HCL 4 MG PO TABS: 4.0000 mg | ORAL_TABLET | Freq: Four times a day (QID) | ORAL | 0 refills | Status: DC | PRN

## 2023-09-19 MED ORDER — ACETAMINOPHEN 500 MG PO TABS: 1000.0000 mg | ORAL_TABLET | Freq: Three times a day (TID) | ORAL | 0 refills | Status: AC

## 2023-09-19 NOTE — Progress Notes (Signed)
Occupational Therapy Treatment Patient Details Name: Renee Watts MRN: [id number] DOB: March 29, 1951 Today's Date: 09/19/2023   History of present illness Pt is a 73 y/o female presenting on 1/21 for same day L 3/4 laminectomy and PSIF. PMH includes: DM, depression/anxiety, R THA, Bil TKA, bladder surgery.   OT comments  Patient supine in bed and agreeable to OT session. Requires cueing to adhere to back precautions during session, cueing for techniques and posture.  She requires min assist for bed mobility, mod assist to stand from EOB but min guard from 3:1 commode; uses RW with min guard support and cueing for posture.  She was educated on AE for LB dressing, up to min assist required.  She will have support at home for ADLs as needed, reports will not shower without assist once cleared and educated on use of 3:1 as shower chair as well.  Pt voiced understanding with recommendations.  OT will follow acutely, continue to recommend no OT follow up after dc home.       If plan is discharge home, recommend the following:  A little help with walking and/or transfers;Assistance with cooking/housework;Assist for transportation;Help with stairs or ramp for entrance;A little help with bathing/dressing/bathroom   Equipment Recommendations  BSC/3in1    Recommendations for Other Services      Precautions / Restrictions Precautions Precautions: Back Precaution Booklet Issued: Yes (comment) Precaution Comments: reviewed with pt, 2 drains from surgery site Required Braces or Orthoses:  (no brace needed order) Restrictions Weight Bearing Restrictions Per Provider Order: No       Mobility Bed Mobility Overal bed mobility: Needs Assistance Bed Mobility: Rolling, Sidelying to Sit, Sit to Sidelying Rolling: Min assist, Used rails Sidelying to sit: Contact guard assist, Used rails       General bed mobility comments: cueing for technique and safety; min assist to roll and increased time to  come to sitting with min guard    Transfers Overall transfer level: Needs assistance Equipment used: Rolling walker (2 wheels) Transfers: Sit to/from Stand Sit to Stand: Contact guard assist, From elevated surface, Mod assist           General transfer comment: standing from EOB with mod assist to power up and increased time; from [facility] stood with min guard.  cueing for hand placement posture and technique     Balance Overall balance assessment: Needs assistance Sitting-balance support: No upper extremity supported, Feet supported Sitting balance-Leahy Scale: Fair Sitting balance - Comments: supervision   Standing balance support: Bilateral upper extremity supported, During functional activity, No upper extremity supported Standing balance-Leahy Scale: Poor Standing balance comment: relies on RW, able to engage in ADLs with 0-1 hand support but needs min guard                           ADL either performed or assessed with clinical judgement   ADL Overall ADL's : Needs assistance/impaired     Grooming: Wash/dry hands;Contact guard assist;Standing           Upper Body Dressing : Supervision/safety;Set up;Sitting   Lower Body Dressing: Minimal assistance;Sit to/from stand;With adaptive equipment Lower Body Dressing Details (indicate cue type and reason): reviewed use of AE for LB dressing, up to min assist to stand Toilet Transfer: Ambulation;Contact guard assist;Rolling walker (2 wheels);BSC/3in1 Toilet Transfer Details (indicate cue type and reason): 3:1 over commode Toileting- Clothing Manipulation and Hygiene: Minimal assistance;Sit to/from stand       Functional  mobility during ADLs: Contact guard assist;Minimal assistance;Cueing for sequencing;Rolling walker (2 wheels) General ADL Comments: cueing for posture throughout session    Extremity/Trunk Assessment              Vision       Perception     Praxis      Cognition Arousal:  Alert Behavior During Therapy: WFL for tasks assessed/performed Overall Cognitive Status: Within Functional Limits for tasks assessed                                 General Comments: pt able to recall 2/3 precautions, min cueing to adhere functionally during session        Exercises      Shoulder Instructions       General Comments drains and dressing intact.    Pertinent Vitals/ Pain       Pain Assessment Pain Assessment: Faces Faces Pain Scale: Hurts little more Pain Location: back Pain Descriptors / Indicators: Discomfort, Grimacing, Operative site guarding Pain Intervention(s): Limited activity within patient's tolerance, Monitored during session, Premedicated before session, Repositioned  Home Living                                          Prior Functioning/Environment              Frequency  Min 1X/week        Progress Toward Goals  OT Goals(current goals can now be found in the care plan section)  Progress towards OT goals: Progressing toward goals  Acute Rehab OT Goals Patient Stated Goal: home OT Goal Formulation: With patient Time For Goal Achievement: 10/02/23 Potential to Achieve Goals: Good ADL Goals Pt Will Perform Grooming: with modified independence;standing Pt Will Perform Lower Body Dressing: with modified independence;sit to/from stand;sitting/lateral leans;with adaptive equipment Pt Will Transfer to Toilet: with modified independence;ambulating;bedside commode Pt Will Perform Toileting - Clothing Manipulation and hygiene: with modified independence;sit to/from stand;sitting/lateral leans Pt Will Perform Tub/Shower Transfer: with modified independence;3 in 1;Shower transfer;ambulating  Plan      Co-evaluation                 AM-PAC OT "6 Clicks" Daily Activity     Outcome Measure   Help from another person eating meals?: None Help from another person taking care of personal grooming?: A  Little Help from another person toileting, which includes using toliet, bedpan, or urinal?: A Little Help from another person bathing (including washing, rinsing, drying)?: A Little Help from another person to put on and taking off regular upper body clothing?: A Little Help from another person to put on and taking off regular lower body clothing?: A Little 6 Click Score: 19    End of Session Equipment Utilized During Treatment: Rolling walker (2 wheels);Gait belt  OT Visit Diagnosis: Other abnormalities of gait and mobility (R26.89);Pain Pain - part of body:  (back)   Activity Tolerance Patient tolerated treatment well   Patient Left in chair;with call bell/phone within reach   Nurse Communication Mobility status        Time: 4132-4401 OT Time Calculation (min): 31 min  Charges: OT General Charges $OT Visit: 1 Visit OT Treatments $Self Care/Home Management : 23-37 mins  Barry Brunner, OT Acute Rehabilitation Services Office [phone]   Chancy Milroy 09/19/2023, 10:15 AM

## 2023-09-19 NOTE — Telephone Encounter (Signed)
Cheryl with [facility] would like patient changed to Inpatient if no discharge.  CB# [phone].  Please advise.  Thank you.

## 2023-09-19 NOTE — Progress Notes (Signed)
Physical Therapy Treatment Patient Details Name: Renee Watts MRN: [id number] DOB: 07-17-1951 Today's Date: 09/19/2023   History of Present Illness Pt is a 73 y/o female presenting on 1/21 for same day L 3/4 laminectomy and PSIF. PMH includes: DM, depression/anxiety, R THA, Bil TKA, bladder surgery.    PT Comments  Pt received in supine after premedication by RN, pt pleasantly agreeable to therapy session and with good participation and tolerance for household distance gait trial with RW and needing up to minA to perform transfers and gait, at times only CGA. Pt needs consistent cues for better technique to avoid twisting during bed mobility but otherwise fairly compliant with back precautions. Pt continues to benefit from PT services to progress toward functional mobility goals, continue to recommend HHPT.    If plan is discharge home, recommend the following: A little help with walking and/or transfers;A little help with bathing/dressing/bathroom;Assistance with cooking/housework;Direct supervision/assist for medications management;Assist for transportation;Help with stairs or ramp for entrance   Can travel by private vehicle        Equipment Recommendations  BSC/3in1    Recommendations for Other Services       Precautions / Restrictions Precautions Precautions: Back Precaution Booklet Issued: Yes (comment) Precaution Comments: reviewed with pt, 2 drains from surgery site Required Braces or Orthoses:  (no brace needed order) Restrictions Weight Bearing Restrictions Per Provider Order: No     Mobility  Bed Mobility Overal bed mobility: Needs Assistance Bed Mobility: Rolling, Sidelying to Sit, Sit to Sidelying Rolling: Min assist, Used rails Sidelying to sit: Used rails, Min assist     Sit to sidelying: Min assist, Used rails General bed mobility comments: cueing for technique and safety; min assist to roll and increased time to come to sitting from flat bed, pt tending  to rely on rails and likely to need physical assist at home if no rails on her bed.    Transfers Overall transfer level: Needs assistance Equipment used: Rolling walker (2 wheels) Transfers: Sit to/from Stand Sit to Stand: From elevated surface, Min assist, Contact guard assist           General transfer comment: standing from EOB with min assist to power up and increased time; from elevated BSC stood with CGA and cues for safety/sequencing.    Ambulation/Gait Ambulation/Gait assistance: Contact guard assist, Min assist Gait Distance (Feet): 125 Feet Assistive device: Rolling walker (2 wheels) Gait Pattern/deviations: Step-through pattern, Decreased step length - right, Decreased step length - left, Decreased stride length       General Gait Details: Min assist for safety with cues for RW management, esp with turning, at times CGA but to encourage step-through pattern, pt needs assist to keep RW moving throughout as pt tends to lean forward over front of RW rails when not assisted to keep RW rolling.   Stairs             Wheelchair Mobility     Tilt Bed    Modified Rankin (Stroke Patients Only)       Balance Overall balance assessment: Needs assistance Sitting-balance support: No upper extremity supported, Feet supported, Single extremity supported Sitting balance-Leahy Scale: Good Sitting balance - Comments: EOB   Standing balance support: Bilateral upper extremity supported, During functional activity, No upper extremity supported Standing balance-Leahy Scale: Poor Standing balance comment: relies on RW, able to engage in ADLs with 0-1 hand support but needs CGA  Cognition Arousal: Alert Behavior During Therapy: WFL for tasks assessed/performed Overall Cognitive Status: Within Functional Limits for tasks assessed                                 General Comments: pt able to recall 3/3 precautions with  increased time, min cueing for improved body mechanics to prevent twisting, especially with bed mobility.        Exercises      General Comments General comments (skin integrity, edema, etc.): pt needs some assist to keep drain lines from getting under her hips during transfers, but drains appear c/d/i      Pertinent Vitals/Pain Pain Assessment Pain Assessment: Faces Faces Pain Scale: Hurts little more Pain Location: back Pain Descriptors / Indicators: Discomfort, Grimacing, Operative site guarding Pain Intervention(s): Monitored during session, Repositioned    Home Living                          Prior Function            PT Goals (current goals can now be found in the care plan section) Acute Rehab PT Goals Patient Stated Goal: regain independence and enjoy the things she wants to do once back recovered PT Goal Formulation: With patient Time For Goal Achievement: 10/02/23 Progress towards PT goals: Progressing toward goals    Frequency    Min 1X/week      PT Plan      Co-evaluation              AM-PAC PT "6 Clicks" Mobility   Outcome Measure  Help needed turning from your back to your side while in a flat bed without using bedrails?: A Little Help needed moving from lying on your back to sitting on the side of a flat bed without using bedrails?: A Little Help needed moving to and from a bed to a chair (including a wheelchair)?: A Little Help needed standing up from a chair using your arms (e.g., wheelchair or bedside chair)?: A Little Help needed to walk in hospital room?: A Little Help needed climbing 3-5 steps with a railing? : A Lot 6 Click Score: 17    End of Session Equipment Utilized During Treatment: Gait belt Activity Tolerance: Patient tolerated treatment well Patient left: in bed;with call bell/phone within reach;with bed alarm set;Other (comment) (semi-sidelying toward her R with pillow to offload L hip/lower back) Nurse  Communication: Mobility status PT Visit Diagnosis: Other abnormalities of gait and mobility (R26.89);Muscle weakness (generalized) (M62.81);Difficulty in walking, not elsewhere classified (R26.2);Other symptoms and signs involving the nervous system (R29.898);Pain Pain - part of body:  (back)     Time: 6578-4696 PT Time Calculation (min) (ACUTE ONLY): 31 min  Charges:    $Gait Training: 8-22 mins $Therapeutic Activity: 8-22 mins PT General Charges $$ ACUTE PT VISIT: 1 Visit                     Zeyad Delaguila P., PTA Acute Rehabilitation Services Secure Chat Preferred 9a-5:30pm Office: [phone]    Dorathy Kinsman [facility] 09/19/2023, 7:17 PM

## 2023-09-19 NOTE — Progress Notes (Addendum)
 Orthopedic Surgery Post-operative Progress Note  Assessment: Patient is a 73 y.o. female who is currently admitted after undergoing L3/4 laminectomy and PSIF   Plan: -Operative plans complete -Drains will be removed on day of discharge -Out of bed as tolerated, no brace -No bending/lifting/twisting greater than 10 pounds -PT/OT evaluate and treat -Pain control -Diabetic diet -No chemoprophylaxis for dvt or antiplatelets for 72 hours after surgery -Ancef x2 post-operative doses -Disposition: remain floor status  ___________________________________________________________________________   Subjective: No acute events overnight. Still having significant back pain that is requiring dilaudid. Leg pain has improved significantly since surgery.    Objective:  General: no acute distress, appropriate affect Neurologic: alert, answering questions appropriately, following commands Respiratory: unlabored breathing on room air Skin: dressing clear/dry/intact, drains with serosanguinous output  MSK (spine):  -Strength exam      Right  Left  EHL    3/5  4/5 TA    3/5  4/5 GSC    4/5  4/5 Knee extension  5/5  5/5 Hip flexion   5/5  5/5  -Sensory exam    Sensation intact to light touch in L2-S1 nerve distributions of bilateral lower extremities   Patient name: Renee Watts Patient MRN: [id number] Date: 09/19/23

## 2023-09-20 ENCOUNTER — Telehealth: Payer: Self-pay | Admitting: Orthopedic Surgery

## 2023-09-20 LAB — GLUCOSE, CAPILLARY: Glucose-Capillary: 125 mg/dL — ABNORMAL HIGH (ref 70–99)

## 2023-09-20 MED ORDER — PROMETHAZINE HCL 12.5 MG PO TABS: 12.5000 mg | ORAL_TABLET | Freq: Four times a day (QID) | ORAL | 0 refills | Status: DC | PRN

## 2023-09-20 NOTE — TOC Progression Note (Signed)
Transition of Care [facility]) - Progression Note    Patient Details  Name: Renee Watts MRN: [id number] Date of Birth: 08/08/1951  Transition of Care [facility]) CM/SW Contact  Epifanio Lesches, RN Phone Number: 09/20/2023, 10:06 AM  Clinical Narrative:    Patient will DC to: home Anticipated DC date: 09/20/2023 Family notified: yes Transport by: car     -  s/p L3/4 laminectomy and posterior instrumented spinal fusion , 1/21  Per MD patient ready for DC today . RN, patient, and patient's daughter notified of DC. Pt agreeable to home health services, no provider preference. Referral made with [facility] and accepted. DME: [facility] already delivered to bedside prior to d/c. Pt without RX med concerns. Pt to pick up meds from local pharmacy. Pt without transportation issues. Post hospital f/u noted on AVS.  RNCM will sign off for now as intervention is no longer needed. Please consult [place] again if new needs arise.         Expected Discharge Plan and Services         Expected Discharge Date: 09/20/23               DME Arranged: Bedside commode DME Agency: [employer] Date DME Agency Contacted: 09/18/23 Time DME Agency Contacted: 1626   HH Arranged: PT HH Agency: [facility] Date HH Agency Contacted: 09/18/23 Time HH Agency Contacted: 1627 Representative spoke with at [facility] Agency: Amy   Social Determinants of Health (SDOH) Interventions SDOH Screenings   Food Insecurity: No Food Insecurity (09/17/2023)  Housing: Low Risk  (09/17/2023)  Transportation Needs: No Transportation Needs (09/17/2023)  Utilities: Not At Risk (09/17/2023)  Alcohol Screen: Low Risk  (03/18/2023)  Depression (PHQ2-9): Medium Risk (07/29/2023)  Financial Resource Strain: Low Risk  (03/18/2023)  Physical Activity: Inactive (03/18/2023)  Social Connections: Moderately Integrated (09/17/2023)  Stress: No Stress Concern Present (03/18/2023)  Tobacco Use: Low Risk  (09/17/2023)  Health Literacy:  Adequate Health Literacy (03/18/2023)    Readmission Risk Interventions     No data to display

## 2023-09-20 NOTE — Progress Notes (Addendum)
 Orthopedic Surgery Post-operative Progress Note  Assessment: Patient is a 73 y.o. female who is currently admitted after undergoing L3/4 laminectomy and PSIF   Plan: -Operative plans complete -Drains removed this morning -Out of bed as tolerated, no brace -No bending/lifting/twisting greater than 10 pounds -PT/OT evaluate and treat -Pain control -Diabetic diet -No chemoprophylaxis for dvt or antiplatelets for 72 hours after surgery -Anticipate discharge to home today  ___________________________________________________________________________   Subjective: No acute events overnight. Pain has gotten significantly better in the last 24hrs. Leg pain still improved since surgery. Wants to go home today.   Objective:  General: no acute distress, appropriate affect Neurologic: alert, answering questions appropriately, following commands Respiratory: unlabored breathing on room air Skin: dressing clear/dry/intact, drains with minimal serosanguinous output  MSK (spine):  -Strength exam      Right  Left  EHL    3/5  4/5 TA    3/5  4/5 GSC    4/5  4/5 Knee extension  5/5  5/5 Hip flexion   5/5  5/5  -Sensory exam    Sensation intact to light touch in L2-S1 nerve distributions of bilateral lower extremities   Patient name: Renee Watts Patient MRN: [id number] Date: 09/20/23

## 2023-09-20 NOTE — Telephone Encounter (Signed)
Pt 's daughter Elmarie Shiley need pre auth for zofran. [facility] call [facility] when approved for them to pick up meds. Pt phone number is [phone]. Spoke with [place] waiting for approval from insurance

## 2023-09-20 NOTE — Plan of Care (Signed)
Pain managed well for shift. Patient ambulated with moderate assist to bathroom.   Problem: Activity: Goal: Risk for activity intolerance will decrease Outcome: Adequate for Discharge   Problem: Education: Goal: Knowledge of General Education information will improve Description: Including pain rating scale, medication(s)/side effects and non-pharmacologic comfort measures Outcome: Not Progressing   Problem: Nutrition: Goal: Adequate nutrition will be maintained Outcome: Not Progressing

## 2023-09-23 ENCOUNTER — Telehealth: Payer: Self-pay

## 2023-09-23 DIAGNOSIS — Z4789 Encounter for other orthopedic aftercare: Secondary | ICD-10-CM | POA: Diagnosis not present

## 2023-09-23 DIAGNOSIS — Z981 Arthrodesis status: Secondary | ICD-10-CM | POA: Diagnosis not present

## 2023-09-23 DIAGNOSIS — M48061 Spinal stenosis, lumbar region without neurogenic claudication: Secondary | ICD-10-CM | POA: Diagnosis not present

## 2023-09-23 DIAGNOSIS — M4316 Spondylolisthesis, lumbar region: Secondary | ICD-10-CM | POA: Diagnosis not present

## 2023-09-23 DIAGNOSIS — M5416 Radiculopathy, lumbar region: Secondary | ICD-10-CM | POA: Diagnosis not present

## 2023-09-23 NOTE — Transitions of Care (Post Inpatient/ED Visit) (Signed)
09/23/2023  Name: Renee Watts MRN: [id number] DOB: May 18, 1951  Today's TOC FU Call Status: Today's TOC FU Call Status:: Successful TOC FU Call Completed TOC FU Call Complete Date: 09/23/23 Patient's Name and Date of Birth confirmed.  Transition Care Management Follow-up Telephone Call Date of Discharge: 09/20/23 Discharge Facility: [facility]) Type of Discharge: Inpatient Admission Primary Inpatient Discharge Diagnosis:: lumbar radiculopathy, lumbar spondylolisthesis How have you been since you were released from the hospital?: Better (Patient reports that she is doing really well. Reports therapist at her home night. Will call back to complete TOC) Any questions or concerns?: No  Items Reviewed: Did you receive and understand the discharge instructions provided?: Yes Medications obtained,verified, and reconciled?: Yes (Medications Reviewed) Any new allergies since your discharge?: No  Medications Reviewed Today: Medications Reviewed Today     Reviewed by Earlie Server, RN (Registered Nurse) on 09/23/23 at 1411  Med List Status: <None>   Medication Order Taking? Sig Documenting Provider Last Dose Status Informant  Accu-Chek Softclix Lancets lancets [id number] Yes daily. for testing as directed [provider] Taking Active Self  acetaminophen (TYLENOL) 500 MG tablet [id number] Yes Take 2 tablets (1,000 mg total) by mouth every 8 (eight) hours for 14 days. London Sheer, MD Taking Active   Ascorbic Acid (VITAMIN C) 1000 MG tablet [id number] Yes Take 1,000 mg by mouth daily. [provider] Taking Active Self  Blood Glucose Monitoring Suppl (ONETOUCH VERIO IQ SYSTEM) W/DEVICE KIT [id number] Yes by Does not apply route. [provider] Taking Active Self  CALCIUM PO [id number] Yes Take 1 tablet by mouth daily. [provider] Taking Active Self  cholecalciferol (VITAMIN D3) 25 MCG (1000 UNIT) tablet [id number] Yes Take 1,000 Units by mouth  daily. [provider] Taking Active Self  cyanocobalamin (VITAMIN B12) 1000 MCG tablet [id number] Yes Take 1,000 mcg by mouth daily. [provider] Taking Active Self  fluticasone (FLONASE) 50 MCG/ACT nasal spray [id number]  Place 1 spray into both nostrils daily.  Patient taking differently: Place 1 spray into both nostrils daily as needed.   Terressa Koyanagi, DO  Active Self  furosemide (LASIX) 40 MG tablet [id number] Yes Take 0.5 tablets (20 mg total) by mouth daily. Deeann Saint, MD Taking Active Self  gabapentin (NEURONTIN) 300 MG capsule [id number] Yes TAKE 1 CAPSULE BY MOUTH IN THE MORNING, 1 AT LUNCH, AND 2 AT BEDTIME Deeann Saint, MD Taking Active Self  Lancets [facility] [place] PLUS [place]) MISC [id number] Yes USE DAILY AS DIRECTED Deeann Saint, MD Taking Active Self  lisinopril (ZESTRIL) 20 MG tablet [id number] Yes TAKE 1 TABLET BY MOUTH EVERY DAY Deeann Saint, MD Taking Active Self  metFORMIN (GLUCOPHAGE-XR) 750 MG 24 hr tablet [id number] Yes TAKE 1 TAB BY MOUTH DAILY WITH BREAKFAST Deeann Saint, MD Taking Active   methocarbamol (ROBAXIN-750) 750 MG tablet [id number] Yes Take 1 tablet (750 mg total) by mouth every 6 (six) hours as needed (pain, muscle spasms). London Sheer, MD Taking Active   Misc. Devices (ROLLATOR [place]) [place] [id number] Yes 1 Device by Does not apply route as directed. Deeann Saint, MD Taking Active Self  Omega-3 Fatty Acids (FISH OIL) 1000 MG CAPS [id number] Yes Take 1,000 mg by mouth daily. [provider] Taking Active Self  omeprazole (PRILOSEC OTC) 20 MG tablet [id number] Yes Take 20 mg by mouth daily as needed (acid reflux). [provider] Taking Active Self  ondansetron (ZOFRAN) 4 MG tablet [id number] Yes  Take 1 tablet (4 mg total) by mouth every 6 (six) hours as needed for nausea. London Sheer, MD Taking Active   [facility] VERIO test strip [id number] Yes USE DAILY AS DIRECTED Deeann Saint, MD  Taking Active Self  oxyCODONE (OXY IR/ROXICODONE) 5 MG immediate release tablet [id number] Yes Take 1-2 tablets (5-10 mg total) by mouth every 4 (four) hours as needed for up to 7 days for moderate pain (pain score 4-6) or severe pain (pain score 7-10). London Sheer, MD Taking Active   polyethylene glycol (MIRALAX / GLYCOLAX) 17 g packet [id number] Yes Take 17 g by mouth daily for 14 days. London Sheer, MD Taking Active   potassium chloride SA (KLOR-CON M) 20 MEQ tablet [id number] Yes TAKE 1 TABLET(20 MEQ) BY MOUTH DAILY Deeann Saint, MD Taking Active Self  promethazine (PHENERGAN) 12.5 MG tablet [id number]  Take 1 tablet (12.5 mg total) by mouth every 6 (six) hours as needed for nausea or vomiting. London Sheer, MD  Active   senna (SENOKOT) 8.6 MG TABS tablet [id number] Yes Take 1 tablet (8.6 mg total) by mouth 2 (two) times daily for 14 days. London Sheer, MD Taking Active   Med List Note Francisca December, CPhT 01/01/13 1328):              Home Care and Equipment/Supplies: Were Home Health Services Ordered?: Yes Name of Home Health Agency:: [facility] Has Agency set up a time to come to your home?: Yes First Home Health Visit Date: 09/23/23 Any new equipment or medical supplies ordered?: No  Functional Questionnaire: Do you need assistance with bathing/showering or dressing?: No Do you need assistance with meal preparation?: No Do you need assistance with eating?: No Do you have difficulty maintaining continence: No Do you need assistance with getting out of bed/getting out of a chair/moving?: No Do you have difficulty managing or taking your medications?: No  Follow up appointments reviewed: PCP Follow-up appointment confirmed?: No (Patient will call PCP and make an appointment.) Specialist Hospital Follow-up appointment confirmed?: Yes Date of Specialist follow-up appointment?: 09/30/23 Follow-Up Specialty Provider:: surgeon Do you need transportation to your  follow-up appointment?: No Do you understand care options if your condition(s) worsen?: Yes-patient verbalized understanding  SDOH Interventions Today    Flowsheet Row Most Recent Value  SDOH Interventions   Food Insecurity Interventions Intervention Not Indicated  Housing Interventions Intervention Not Indicated  Transportation Interventions Intervention Not Indicated  Utilities Interventions Intervention Not Indicated      Interventions Today    Flowsheet Row Most Recent Value  Chronic Disease   Chronic disease during today's visit Other  [lumbar surgery]  General Interventions   General Interventions Discussed/Reviewed General Interventions Discussed, General Interventions Reviewed, Doctor Visits  Doctor Visits Discussed/Reviewed Doctor Visits Discussed, Specialist  PCP/Specialist Visits Compliance with follow-up visit  Exercise Interventions   Exercise Discussed/Reviewed Exercise Discussed  Education Interventions   Education Provided Provided Education  [reviewed importance of pain control and BM's]  Nutrition Interventions   Nutrition Discussed/Reviewed Nutrition Discussed  [reviewed DM diet as prior to surgery]  Pharmacy Interventions   Pharmacy Dicussed/Reviewed Medications and their functions  Safety Interventions   Safety Discussed/Reviewed Fall Risk       TOC Interventions Today    Flowsheet Row Most Recent Value  TOC Interventions   TOC Interventions Discussed/Reviewed TOC Interventions Discussed, TOC Interventions Reviewed, Post discharge activity limitations per provider, Post op wound/incision care, S/S of infection  Home health nurse out to see patient today and reviewed all discharge orders per patient. I again reviewed all medications and discharge orders. Reviewed pain control and importance of managing constipations.  Offered 30 day TOC program and patient declined. Provided my contact information if patient needs assistance in the future.     Lonia Chimera, RN, BSN, CEN [employer]- Transition of Care Team.  [facility] [phone]

## 2023-09-24 ENCOUNTER — Telehealth: Payer: Self-pay | Admitting: Orthopedic Surgery

## 2023-09-24 NOTE — Telephone Encounter (Signed)
Received call from Charri-(PT) with [facility] needing verbal Orders for HHPT   3 Wk 1 and 2 Wk 1. The number to contact [place] is [phone]

## 2023-09-25 ENCOUNTER — Other Ambulatory Visit: Payer: Self-pay | Admitting: Family Medicine

## 2023-09-25 NOTE — Telephone Encounter (Signed)
I called and gave verbal auth

## 2023-09-27 ENCOUNTER — Ambulatory Visit (INDEPENDENT_AMBULATORY_CARE_PROVIDER_SITE_OTHER): Payer: PPO | Admitting: Family Medicine

## 2023-09-27 ENCOUNTER — Encounter: Payer: Self-pay | Admitting: Family Medicine

## 2023-09-27 VITALS — BP 130/70 | HR 60 | Temp 98.4°F | Ht 65.5 in | Wt 154.6 lb

## 2023-09-27 DIAGNOSIS — D509 Iron deficiency anemia, unspecified: Secondary | ICD-10-CM

## 2023-09-27 DIAGNOSIS — Z9889 Other specified postprocedural states: Secondary | ICD-10-CM

## 2023-09-27 DIAGNOSIS — E1149 Type 2 diabetes mellitus with other diabetic neurological complication: Secondary | ICD-10-CM | POA: Diagnosis not present

## 2023-09-27 DIAGNOSIS — Z7984 Long term (current) use of oral hypoglycemic drugs: Secondary | ICD-10-CM

## 2023-09-27 DIAGNOSIS — I10 Essential (primary) hypertension: Secondary | ICD-10-CM

## 2023-09-27 MED ORDER — FERROUS GLUCONATE 324 (38 FE) MG PO TABS: 324.0000 mg | ORAL_TABLET | Freq: Every day | ORAL | 1 refills | Status: AC

## 2023-09-27 NOTE — Progress Notes (Unsigned)
   Established Patient Office Visit   Subjective  Patient ID: Renee Watts, female    DOB: [birth year]  Age: 73 y.o. MRN: [id number]  Chief Complaint  Patient presents with  . Hospitalization Follow-up    Back surgery on 1/21, patient is doing and feeling better    HPI    {History (Optional):23778}  ROS Negative unless stated above    Objective:     BP 130/70 (BP Location: Left Arm, Patient Position: Sitting, Cuff Size: Normal)   Pulse 60   Temp 98.4 F (36.9 C) (Oral)   Ht 5' 5.5" (1.664 m)   Wt 154 lb 9.6 oz (70.1 kg)   SpO2 97%   BMI 25.34 kg/m  {Vitals History (Optional):23777}  Physical Exam   No results found for any visits on 09/27/23.    Assessment & Plan:  Type II diabetes mellitus with neurological manifestations (HCC)  Essential hypertension  Vitamin D deficiency  Hyperlipidemia, unspecified hyperlipidemia type    No follow-ups on file.   Deeann Saint, MD

## 2023-09-27 NOTE — Patient Instructions (Addendum)
A prescription for iron tablets was sent to your local pharmacy.  Remember iron can cause constipation and make your stool appear dark in color.  You can take Miralax to help with the constipation especially since you are taking the pain meds.  We can recheck your labs in the next 2-3 wks to make your hemoglobin level is improving.

## 2023-09-30 ENCOUNTER — Ambulatory Visit (INDEPENDENT_AMBULATORY_CARE_PROVIDER_SITE_OTHER): Payer: PPO | Admitting: Orthopedic Surgery

## 2023-09-30 ENCOUNTER — Other Ambulatory Visit (INDEPENDENT_AMBULATORY_CARE_PROVIDER_SITE_OTHER): Payer: PPO

## 2023-09-30 DIAGNOSIS — Z981 Arthrodesis status: Secondary | ICD-10-CM | POA: Diagnosis not present

## 2023-09-30 NOTE — Progress Notes (Addendum)
 Orthopedic Surgery Post-operative Office Visit  Procedure: L3/4 laminectomy and PSIF Date of Surgery: 09/17/2023 (~2 weeks post-op)  Assessment: Patient is a 73 y.o. who is doing well after surgery. Radiating leg pain has resolved and back pain is slowly improving   Plan: -Operative plans complete -Out of bed as tolerated, no brace -No bending/lifting/twisting greater than 10 pounds -Pain management: weaning oxycodone -Return to office in 4 weeks, x-rays needed at next visit: AP/lateral lumbar  ___________________________________________________________________________   Subjective: Patient has been doing well since discharge from the hospital.  She is over having the radiating leg pain.  She is still having some back pain but it is slowly getting better.  She feels that she is been able to do more around the house.  She has been working with physical therapy and doing home exercises.  She has not noticed any redness or drainage around her incision.  Objective:  General: no acute distress, appropriate affect Neurologic: alert, answering questions appropriately, following commands Respiratory: unlabored breathing on room air Skin: incision is well approximated with no erythema, induration, active/expressible drainage  MSK (spine):  -Strength exam      Left  Right  EHL    4/5  3/5 TA    4/5  3/5 GSC    5/5  4/5 Knee extension  5/5  5/5 Hip flexion   5/5  5/5  -Sensory exam    Sensation intact to light touch in L2-S1 nerve distributions of bilateral lower extremities  Imaging: X-rays of the lumbar spine from 09/30/2023 were independently reviewed and interpreted, showing laminectomy defect at L3/4. Posterior instrumentation at L3 and L4. Screws appear in satisfactory position. No lucency seen around the screws. Disc height loss at L4/5 and L5/S1. No fracture or dislocation seen.    Patient name: Renee Watts Patient MRN: [id number] Date of visit: 09/30/23

## 2023-10-01 ENCOUNTER — Telehealth: Payer: Self-pay

## 2023-10-01 MED ORDER — METFORMIN HCL ER 750 MG PO TB24: 750.0000 mg | ORAL_TABLET | Freq: Every day | ORAL | 3 refills | Status: DC

## 2023-10-01 NOTE — Telephone Encounter (Signed)
Iantha Fallen calling stating that her promethazine has a drug interaction with her potassium, continue to take? Cherise [phone]

## 2023-10-02 NOTE — Telephone Encounter (Signed)
 I called and lmom for Cherise to advise her of Dr. Frieda Jew message

## 2023-10-04 ENCOUNTER — Telehealth: Payer: Self-pay | Admitting: Orthopedic Surgery

## 2023-10-04 NOTE — Telephone Encounter (Signed)
 Patient called. She would like a refill on her pain medication.

## 2023-10-05 MED ORDER — HYDROCODONE-ACETAMINOPHEN 5-325 MG PO TABS: 1.0000 | ORAL_TABLET | Freq: Four times a day (QID) | ORAL | 0 refills | Status: AC | PRN

## 2023-10-05 NOTE — Addendum Note (Signed)
 Addended by: Colette Davies on: 10/05/2023 06:45 AM   Modules accepted: Orders

## 2023-10-14 ENCOUNTER — Other Ambulatory Visit: Payer: Self-pay | Admitting: Family Medicine

## 2023-10-14 ENCOUNTER — Other Ambulatory Visit (INDEPENDENT_AMBULATORY_CARE_PROVIDER_SITE_OTHER): Payer: PPO

## 2023-10-14 DIAGNOSIS — D509 Iron deficiency anemia, unspecified: Secondary | ICD-10-CM

## 2023-10-14 DIAGNOSIS — Z1231 Encounter for screening mammogram for malignant neoplasm of breast: Secondary | ICD-10-CM

## 2023-10-14 LAB — CBC WITH DIFFERENTIAL/PLATELET
Basophils Absolute: 0 10*3/uL (ref 0.0–0.1)
Basophils Relative: 0.4 % (ref 0.0–3.0)
Eosinophils Absolute: 0.3 10*3/uL (ref 0.0–0.7)
Eosinophils Relative: 5.9 % — ABNORMAL HIGH (ref 0.0–5.0)
HCT: 35.8 % — ABNORMAL LOW (ref 36.0–46.0)
Hemoglobin: 11.8 g/dL — ABNORMAL LOW (ref 12.0–15.0)
Lymphocytes Relative: 31 % (ref 12.0–46.0)
Lymphs Abs: 1.5 10*3/uL (ref 0.7–4.0)
MCHC: 33 g/dL (ref 30.0–36.0)
MCV: 91.9 fL (ref 78.0–100.0)
Monocytes Absolute: 0.3 10*3/uL (ref 0.1–1.0)
Monocytes Relative: 6.5 % (ref 3.0–12.0)
Neutro Abs: 2.8 10*3/uL (ref 1.4–7.7)
Neutrophils Relative %: 56.2 % (ref 43.0–77.0)
Platelets: 157 10*3/uL (ref 150.0–400.0)
RBC: 3.89 Mil/uL (ref 3.87–5.11)
RDW: 14.7 % (ref 11.5–15.5)
WBC: 5 10*3/uL (ref 4.0–10.5)

## 2023-10-15 ENCOUNTER — Ambulatory Visit: Payer: PPO

## 2023-10-15 LAB — IRON,TIBC AND FERRITIN PANEL
%SAT: 27 % (ref 16–45)
Ferritin: 99 ng/mL (ref 16–288)
Iron: 89 ug/dL (ref 45–160)
TIBC: 329 ug/dL (ref 250–450)

## 2023-10-24 ENCOUNTER — Encounter: Payer: Self-pay | Admitting: Family Medicine

## 2023-10-25 ENCOUNTER — Telehealth: Payer: Self-pay | Admitting: Orthopedic Surgery

## 2023-10-25 MED ORDER — HYDROCODONE-ACETAMINOPHEN 5-325 MG PO TABS: 1.0000 | ORAL_TABLET | Freq: Four times a day (QID) | ORAL | 0 refills | Status: AC | PRN

## 2023-10-25 NOTE — Telephone Encounter (Signed)
 Patient called would like oxycodone called in for her. Cb# [phone]

## 2023-10-28 ENCOUNTER — Other Ambulatory Visit (INDEPENDENT_AMBULATORY_CARE_PROVIDER_SITE_OTHER): Payer: Self-pay

## 2023-10-28 ENCOUNTER — Ambulatory Visit (INDEPENDENT_AMBULATORY_CARE_PROVIDER_SITE_OTHER): Payer: PPO | Admitting: Orthopedic Surgery

## 2023-10-28 DIAGNOSIS — Z981 Arthrodesis status: Secondary | ICD-10-CM

## 2023-10-28 NOTE — Progress Notes (Signed)
 Orthopedic Surgery Post-operative Office Visit   Procedure: L3/4 laminectomy and PSIF Date of Surgery: 09/17/2023 (~6 weeks post-op)   Assessment: Patient is a 74 y.o. who is doing well since surgery.  Leg pain has improved significantly.  Back pain slowly improving.     Plan: -Operative plans complete -Out of bed as tolerated, no brace -Okay to submerge wound at this time -Continue to work with PT and do the home exercise program to work on her strength -No bending/lifting/twisting greater than 10 pounds -Pain management: weaning narcotics, is on Norco right now.  Will transition to tramadol next -Return to office in 6 weeks, x-rays needed at next visit: AP/lateral/flex/ex lumbar   ___________________________________________________________________________     Subjective: Patient has been doing well since she was last seen in the office.  She has noted decreasing pain.  She is using less pain medication.  Her radiating leg pain has improved significantly.  She still has some moderate back pain that she notes when she is up and moving.  However, she feels that this has gotten better as well since our last office visit.  She has been working with physical therapy and doing home exercises to work on her leg strength.   Objective:   General: no acute distress, appropriate affect Neurologic: alert, answering questions appropriately, following commands Respiratory: unlabored breathing on room air Skin: incision is well healed with no erythema, induration, active/expressible drainage   MSK (spine):   -Strength exam                                                   Left                  Right   EHL                              4/5                  3/5 TA                                 4/5                  3/5 GSC                             5/5                  4/5 Knee extension            5/5                  5/5 Hip flexion                    5/5                  4/5   -Sensory  exam                           Sensation intact to light touch in L2-S1 nerve distributions of bilateral lower extremities   Imaging: X-rays of the lumbar spine from 10/28/2023 were independently  reviewed and interpreted, showing L3/4 spondylolisthesis. Posterior instrumentation at L3 and L4 with no lucency around the screws. Laminectomy defect seen at L3/4. No fracture or dislocation seen.      Patient name: Renee Watts Patient MRN: [id number] Date of visit: 10/28/23

## 2023-11-05 ENCOUNTER — Telehealth: Payer: Self-pay | Admitting: Orthopedic Surgery

## 2023-11-05 MED ORDER — METHOCARBAMOL 750 MG PO TABS: 750.0000 mg | ORAL_TABLET | Freq: Four times a day (QID) | ORAL | 1 refills | Status: DC | PRN

## 2023-11-05 MED ORDER — HYDROCODONE-ACETAMINOPHEN 5-325 MG PO TABS: 1.0000 | ORAL_TABLET | Freq: Four times a day (QID) | ORAL | 0 refills | Status: AC | PRN

## 2023-11-05 NOTE — Telephone Encounter (Signed)
 Patient called. Would like oxycodone and Robaxin refilled her [phone]

## 2023-11-06 ENCOUNTER — Ambulatory Visit
Admission: RE | Admit: 2023-11-06 | Discharge: 2023-11-06 | Disposition: A | Discharge status: Home / Self Care | Payer: PPO | Source: Ambulatory Visit | Attending: Family Medicine

## 2023-11-06 DIAGNOSIS — Z1231 Encounter for screening mammogram for malignant neoplasm of breast: Secondary | ICD-10-CM | POA: Diagnosis not present

## 2023-11-18 ENCOUNTER — Telehealth: Payer: Self-pay | Admitting: Orthopedic Surgery

## 2023-11-18 MED ORDER — TRAMADOL HCL 50 MG PO TABS: 50.0000 mg | ORAL_TABLET | Freq: Four times a day (QID) | ORAL | 0 refills | Status: AC | PRN

## 2023-11-18 NOTE — Telephone Encounter (Signed)
 Pt called requesting a refill of pain medication. Please send to [facility]. Pt phone number is [phone]

## 2023-11-26 ENCOUNTER — Telehealth: Payer: Self-pay | Admitting: Orthopedic Surgery

## 2023-11-26 MED ORDER — TRAMADOL HCL 50 MG PO TABS: 50.0000 mg | ORAL_TABLET | Freq: Four times a day (QID) | ORAL | 0 refills | Status: AC | PRN

## 2023-11-26 NOTE — Telephone Encounter (Signed)
 Patient called and needs a refill on Tramadol. CB#[phone]

## 2023-11-26 NOTE — Addendum Note (Signed)
 Addended by: Willia Craze on: 11/26/2023 03:43 PM   Modules accepted: Orders

## 2023-11-27 ENCOUNTER — Ambulatory Visit: Payer: PPO | Admitting: Family Medicine

## 2023-11-27 ENCOUNTER — Ambulatory Visit: Admitting: Family Medicine

## 2023-11-27 ENCOUNTER — Encounter: Payer: Self-pay | Admitting: Family Medicine

## 2023-11-27 VITALS — BP 140/74 | HR 71 | Temp 99.2°F | Ht 65.5 in | Wt 158.4 lb

## 2023-11-27 DIAGNOSIS — I10 Essential (primary) hypertension: Secondary | ICD-10-CM

## 2023-11-27 DIAGNOSIS — G4700 Insomnia, unspecified: Secondary | ICD-10-CM | POA: Diagnosis not present

## 2023-11-27 DIAGNOSIS — E1149 Type 2 diabetes mellitus with other diabetic neurological complication: Secondary | ICD-10-CM | POA: Diagnosis not present

## 2023-11-27 DIAGNOSIS — Z9889 Other specified postprocedural states: Secondary | ICD-10-CM | POA: Diagnosis not present

## 2023-11-27 DIAGNOSIS — H1011 Acute atopic conjunctivitis, right eye: Secondary | ICD-10-CM | POA: Diagnosis not present

## 2023-11-27 DIAGNOSIS — H00011 Hordeolum externum right upper eyelid: Secondary | ICD-10-CM | POA: Diagnosis not present

## 2023-11-27 DIAGNOSIS — Z7984 Long term (current) use of oral hypoglycemic drugs: Secondary | ICD-10-CM

## 2023-11-27 DIAGNOSIS — D509 Iron deficiency anemia, unspecified: Secondary | ICD-10-CM

## 2023-11-27 NOTE — Progress Notes (Unsigned)
 Established Patient Office Visit   Subjective  Patient ID: Renee Watts, female    DOB: 1951/02/03  Age: 73 y.o. MRN: [id number]  Chief Complaint  Patient presents with  . Follow-up    follow-up Dm, legs feel weak, patient has no appetite     Pt I s a 73 yo female seen for f/u and acute concerns.  Pt endorses leg feeling weak and decreased appetite.     Patient Active Problem List   Diagnosis Date Noted  . Lumbar radiculopathy 09/17/2023  . Osteopenia 02/15/2021  . Venous stasis of both lower extremities 06/27/2020  . Bilateral lower extremity edema 02/05/2020  . Recurrent sinusitis 09/03/2018  . Vaginal vault prolapse after hysterectomy 02/13/2017  . Obesity 01/06/2016  . Low back pain 01/06/2016  . Diabetic peripheral neuropathy (HCC) 09/01/2012  . Hyperlipemia 09/16/2006  . Essential hypertension 09/16/2006  . GERD 09/16/2006  . Type II diabetes mellitus with neurological manifestations (HCC) 06/17/2006  . Osteoarthritis 06/17/2006  . DEPENDENT EDEMA, LEGS, BILATERAL 06/17/2006   Past Medical History:  Diagnosis Date  . Allergy   . Benign paroxysmal positional vertigo 06/25/2013   dx with prior PCP, s/p labs, mri and vestibular rehab; nasal spray for etd seemed to help  . Blood in stool 03/11/2012  . Dependent edema    Bilateral  . Depression   . GERD (gastroesophageal reflux disease)    on meds  . Hyperlipidemia    on meds  . Low back pain    sciatica; sees orthopedic specialist ([facility])for this and shoulder tendinitis  . Obesity, diabetes, and hypertension syndrome (HCC)    on med  . Osteoarthritis, knee    Bilateral, s/p 3 total knee replacement surgerues on right, last 2005. Dr. Shelle Iron  . Pellegrini-Steida syndrome    Myositis ossificans  . Seasonal allergies   . Shingles 07/10/2012   LESIONS CLEARED BUT NOT TOLERATING ANY THING TIGHT AGAINST BODY--SHINGLES WERE AROSS BACK AND ABDOMEN   Past Surgical History:  Procedure Laterality  Date  . ABDOMINAL HYSTERECTOMY  1984  . BLADDER SURGERY  03/2017   bladder tac per pt  . COLONOSCOPY  2020   MS-MAC-goly (good)-hems/TAx5)  . left shoulder steroid injection    . POLYPECTOMY  2020   TA x 5  . POSTERIOR FUSION PEDICLE SCREW PLACEMENT N/A 09/17/2023   Procedure: LUMBAR THREE-FOUR LAMINECTOMY AND POSTERIOR INSTRUMENTAL SPINAL FUSION;  Surgeon: London Sheer, MD;  Location: [facility];  Service: Orthopedics;  Laterality: N/A;  . TONSILLECTOMY  1977  . TOTAL HIP ARTHROPLASTY Right 10/10/2012   Procedure: R TOTAL HIP ARTHROPLASTY ANTERIOR APPROACH;  Surgeon: Kathryne Hitch, MD;  Location: [facility];  Service: Orthopedics;  Laterality: Right;  Right Total Hip Arthroplasty, Anterior Approach (C-Arm)  . TOTAL KNEE ARTHROPLASTY Right    with multiple revisions, 2003, 2005  . TOTAL KNEE ARTHROPLASTY Left 01/09/2013   Procedure: LEFT TOTAL KNEE ARTHROPLASTY;  Surgeon: Kathryne Hitch, MD;  Location: [facility];  Service: Orthopedics;  Laterality: Left;  . TUBAL LIGATION  1978  . WISDOM TOOTH EXTRACTION  2013,2014   Social History   Tobacco Use  . Smoking status: Never  . Smokeless tobacco: Never  Vaping Use  . Vaping status: Never Used  Substance Use Topics  . Alcohol use: No  . Drug use: No   Family History  Problem Relation Age of Onset  . Diabetes Mother   . Hypertension Mother   . Hypertension Father   .  Cancer Father        Lung CA  . Coronary artery disease Father   . Colon cancer Neg Hx   . Colon polyps Neg Hx   . Esophageal cancer Neg Hx   . Rectal cancer Neg Hx   . Stomach cancer Neg Hx    No Known Allergies    ROS Negative unless stated above    Objective:     BP (!) 140/74 (BP Location: Left Arm, Patient Position: Sitting, Cuff Size: Normal)   Pulse 71   Temp 99.2 F (37.3 C) (Oral)   Ht 5' 5.5" (1.664 m)   Wt 158 lb 6.4 oz (71.8 kg)   SpO2 97%   BMI 25.96 kg/m  BP Readings from Last 3 Encounters:  11/27/23 (!) 140/74  09/27/23  130/70  09/20/23 119/60   Wt Readings from Last 3 Encounters:  11/27/23 158 lb 6.4 oz (71.8 kg)  09/27/23 154 lb 9.6 oz (70.1 kg)  09/17/23 162 lb (73.5 kg)      Physical Exam Constitutional:      General: She is not in acute distress.    Appearance: Normal appearance.  HENT:     Head: Normocephalic and atraumatic.     Nose: Nose normal.     Mouth/Throat:     Mouth: Mucous membranes are moist.  Cardiovascular:     Rate and Rhythm: Normal rate and regular rhythm.     Heart sounds: Normal heart sounds. No murmur heard.    No gallop.  Pulmonary:     Effort: Pulmonary effort is normal. No respiratory distress.     Breath sounds: Normal breath sounds. No wheezing, rhonchi or rales.  Skin:    General: Skin is warm and dry.  Neurological:     Mental Status: She is alert and oriented to person, place, and time.    No results found for any visits on 11/27/23.    Assessment & Plan:  Type II diabetes mellitus with neurological manifestations (HCC) -     Microalbumin / creatinine urine ratio; Future  Essential hypertension  Iron deficiency anemia, unspecified iron deficiency anemia type  Allergic conjunctivitis of right eye  Hordeolum externum of right upper eyelid  S/P lumbar laminectomy  Insomnia, unspecified type    No follow-ups on file.   Deeann Saint, MD

## 2023-11-28 LAB — MICROALBUMIN / CREATININE URINE RATIO
Creatinine,U: 33.3 mg/dL
Microalb Creat Ratio: 469.8 mg/g — ABNORMAL HIGH (ref 0.0–30.0)
Microalb, Ur: 15.7 mg/dL — ABNORMAL HIGH (ref 0.0–1.9)

## 2023-12-04 ENCOUNTER — Telehealth: Payer: Self-pay | Admitting: Orthopedic Surgery

## 2023-12-04 MED ORDER — TRAMADOL HCL 50 MG PO TABS: 50.0000 mg | ORAL_TABLET | Freq: Four times a day (QID) | ORAL | 0 refills | Status: DC | PRN

## 2023-12-04 NOTE — Telephone Encounter (Signed)
 Patient called needs a prescription for tramadol. CB#[phone]

## 2023-12-04 NOTE — Addendum Note (Signed)
 Addended by: Willia Craze on: 12/04/2023 03:44 PM   Modules accepted: Orders

## 2023-12-05 ENCOUNTER — Other Ambulatory Visit: Payer: Self-pay

## 2023-12-05 ENCOUNTER — Telehealth: Payer: Self-pay | Admitting: Orthopaedic Surgery

## 2023-12-05 ENCOUNTER — Inpatient Hospital Stay (HOSPITAL_COMMUNITY)
Admission: EM | Admit: 2023-12-05 | Discharge: 2023-12-09 | DRG: 481 | Disposition: A | Discharge status: Home Health | Attending: Internal Medicine | Admitting: Internal Medicine

## 2023-12-05 ENCOUNTER — Inpatient Hospital Stay (HOSPITAL_COMMUNITY)

## 2023-12-05 ENCOUNTER — Emergency Department (HOSPITAL_COMMUNITY)

## 2023-12-05 DIAGNOSIS — Z96651 Presence of right artificial knee joint: Secondary | ICD-10-CM | POA: Diagnosis not present

## 2023-12-05 DIAGNOSIS — Z90710 Acquired absence of both cervix and uterus: Secondary | ICD-10-CM

## 2023-12-05 DIAGNOSIS — S72451A Displaced supracondylar fracture without intracondylar extension of lower end of right femur, initial encounter for closed fracture: Secondary | ICD-10-CM | POA: Diagnosis not present

## 2023-12-05 DIAGNOSIS — W19XXXA Unspecified fall, initial encounter: Secondary | ICD-10-CM | POA: Diagnosis not present

## 2023-12-05 DIAGNOSIS — M9711XA Periprosthetic fracture around internal prosthetic right knee joint, initial encounter: Secondary | ICD-10-CM | POA: Diagnosis present

## 2023-12-05 DIAGNOSIS — D649 Anemia, unspecified: Secondary | ICD-10-CM | POA: Diagnosis not present

## 2023-12-05 DIAGNOSIS — K219 Gastro-esophageal reflux disease without esophagitis: Secondary | ICD-10-CM | POA: Diagnosis not present

## 2023-12-05 DIAGNOSIS — E1159 Type 2 diabetes mellitus with other circulatory complications: Secondary | ICD-10-CM | POA: Diagnosis present

## 2023-12-05 DIAGNOSIS — Z8249 Family history of ischemic heart disease and other diseases of the circulatory system: Secondary | ICD-10-CM | POA: Diagnosis not present

## 2023-12-05 DIAGNOSIS — Z7984 Long term (current) use of oral hypoglycemic drugs: Secondary | ICD-10-CM | POA: Diagnosis not present

## 2023-12-05 DIAGNOSIS — M9721XA Periprosthetic fracture around internal prosthetic right ankle joint, initial encounter: Secondary | ICD-10-CM

## 2023-12-05 DIAGNOSIS — Y92007 Garden or yard of unspecified non-institutional (private) residence as the place of occurrence of the external cause: Secondary | ICD-10-CM

## 2023-12-05 DIAGNOSIS — W1839XA Other fall on same level, initial encounter: Secondary | ICD-10-CM | POA: Diagnosis present

## 2023-12-05 DIAGNOSIS — E1149 Type 2 diabetes mellitus with other diabetic neurological complication: Secondary | ICD-10-CM | POA: Diagnosis present

## 2023-12-05 DIAGNOSIS — Z79899 Other long term (current) drug therapy: Secondary | ICD-10-CM

## 2023-12-05 DIAGNOSIS — S72401A Unspecified fracture of lower end of right femur, initial encounter for closed fracture: Secondary | ICD-10-CM | POA: Diagnosis present

## 2023-12-05 DIAGNOSIS — Z471 Aftercare following joint replacement surgery: Secondary | ICD-10-CM | POA: Diagnosis not present

## 2023-12-05 DIAGNOSIS — M25561 Pain in right knee: Secondary | ICD-10-CM | POA: Diagnosis not present

## 2023-12-05 DIAGNOSIS — Z833 Family history of diabetes mellitus: Secondary | ICD-10-CM | POA: Diagnosis not present

## 2023-12-05 DIAGNOSIS — Z96652 Presence of left artificial knee joint: Secondary | ICD-10-CM | POA: Diagnosis present

## 2023-12-05 DIAGNOSIS — Z981 Arthrodesis status: Secondary | ICD-10-CM

## 2023-12-05 DIAGNOSIS — E785 Hyperlipidemia, unspecified: Secondary | ICD-10-CM | POA: Diagnosis not present

## 2023-12-05 DIAGNOSIS — Y9301 Activity, walking, marching and hiking: Secondary | ICD-10-CM | POA: Diagnosis present

## 2023-12-05 DIAGNOSIS — M9701XA Periprosthetic fracture around internal prosthetic right hip joint, initial encounter: Secondary | ICD-10-CM | POA: Diagnosis not present

## 2023-12-05 DIAGNOSIS — S72001A Fracture of unspecified part of neck of right femur, initial encounter for closed fracture: Secondary | ICD-10-CM | POA: Diagnosis not present

## 2023-12-05 DIAGNOSIS — Z96641 Presence of right artificial hip joint: Secondary | ICD-10-CM | POA: Diagnosis present

## 2023-12-05 DIAGNOSIS — Z801 Family history of malignant neoplasm of trachea, bronchus and lung: Secondary | ICD-10-CM

## 2023-12-05 DIAGNOSIS — S72461A Displaced supracondylar fracture with intracondylar extension of lower end of right femur, initial encounter for closed fracture: Secondary | ICD-10-CM | POA: Diagnosis not present

## 2023-12-05 LAB — CBC WITH DIFFERENTIAL/PLATELET
Abs Immature Granulocytes: 0.03 10*3/uL (ref 0.00–0.07)
Basophils Absolute: 0 10*3/uL (ref 0.0–0.1)
Basophils Relative: 1 %
Eosinophils Absolute: 0.2 10*3/uL (ref 0.0–0.5)
Eosinophils Relative: 3 %
HCT: 33.5 % — ABNORMAL LOW (ref 36.0–46.0)
Hemoglobin: 11.3 g/dL — ABNORMAL LOW (ref 12.0–15.0)
Immature Granulocytes: 0 %
Lymphocytes Relative: 17 %
Lymphs Abs: 1.2 10*3/uL (ref 0.7–4.0)
MCH: 30.2 pg (ref 26.0–34.0)
MCHC: 33.7 g/dL (ref 30.0–36.0)
MCV: 89.6 fL (ref 80.0–100.0)
Monocytes Absolute: 0.4 10*3/uL (ref 0.1–1.0)
Monocytes Relative: 5 %
Neutro Abs: 5.2 10*3/uL (ref 1.7–7.7)
Neutrophils Relative %: 74 %
Platelets: 153 10*3/uL (ref 150–400)
RBC: 3.74 MIL/uL — ABNORMAL LOW (ref 3.87–5.11)
RDW: 13.5 % (ref 11.5–15.5)
WBC: 7 10*3/uL (ref 4.0–10.5)
nRBC: 0 % (ref 0.0–0.2)

## 2023-12-05 LAB — COMPREHENSIVE METABOLIC PANEL WITH GFR
ALT: 28 U/L (ref 0–44)
AST: 35 U/L (ref 15–41)
Albumin: 3.7 g/dL (ref 3.5–5.0)
Alkaline Phosphatase: 49 U/L (ref 38–126)
Anion gap: 10 (ref 5–15)
BUN: 9 mg/dL (ref 8–23)
CO2: 23 mmol/L (ref 22–32)
Calcium: 9.3 mg/dL (ref 8.9–10.3)
Chloride: 105 mmol/L (ref 98–111)
Creatinine, Ser: 0.44 mg/dL (ref 0.44–1.00)
GFR, Estimated: >60 mL/min (ref >60)
Glucose, Bld: 109 mg/dL — ABNORMAL HIGH (ref 70–99)
Potassium: 3.5 mmol/L (ref 3.5–5.1)
Sodium: 138 mmol/L (ref 135–145)
Total Bilirubin: 0.8 mg/dL (ref 0.0–1.2)
Total Protein: 7.5 g/dL (ref 6.5–8.1)

## 2023-12-05 LAB — TYPE AND SCREEN
ABO/RH(D): O POS
Antibody Screen: NEGATIVE

## 2023-12-05 LAB — GLUCOSE, CAPILLARY: Glucose-Capillary: 135 mg/dL — ABNORMAL HIGH (ref 70–99)

## 2023-12-05 MED ORDER — ACETAMINOPHEN 500 MG PO TABS
1000.0000 mg | ORAL_TABLET | Freq: Once | ORAL | Status: AC
  Administered 2023-12-05: 1000 mg via ORAL
  Filled 2023-12-05: qty 2

## 2023-12-05 MED ORDER — ONDANSETRON HCL 4 MG/2ML IJ SOLN: 4.0000 mg | Freq: Four times a day (QID) | INTRAMUSCULAR | Status: DC | PRN

## 2023-12-05 MED ORDER — HYDROMORPHONE HCL 1 MG/ML IJ SOLN
0.5000 mg | Freq: Once | INTRAMUSCULAR | Status: AC
  Administered 2023-12-05: 0.5 mg via INTRAVENOUS
  Filled 2023-12-05: qty 1

## 2023-12-05 MED ORDER — ACETAMINOPHEN 500 MG PO TABS: 1000.0000 mg | ORAL_TABLET | Freq: Once | ORAL | Status: DC

## 2023-12-05 MED ORDER — MORPHINE SULFATE (PF) 2 MG/ML IV SOLN
0.5000 mg | INTRAVENOUS | Status: DC | PRN
  Administered 2023-12-07 – 2023-12-08 (×5): 0.5 mg via INTRAVENOUS
  Filled 2023-12-05 (×5): qty 1

## 2023-12-05 MED ORDER — CELECOXIB 200 MG PO CAPS: 200.0000 mg | ORAL_CAPSULE | Freq: Once | ORAL | Status: DC

## 2023-12-05 MED ORDER — POVIDONE-IODINE 10 % EX SWAB: 2.0000 | Freq: Once | CUTANEOUS | Status: DC

## 2023-12-05 MED ORDER — LISINOPRIL 20 MG PO TABS
20.0000 mg | ORAL_TABLET | Freq: Every day | ORAL | Status: DC
  Administered 2023-12-06 – 2023-12-09 (×4): 20 mg via ORAL
  Filled 2023-12-05 (×4): qty 1

## 2023-12-05 MED ORDER — INSULIN ASPART 100 UNIT/ML IJ SOLN
0.0000 [IU] | Freq: Three times a day (TID) | INTRAMUSCULAR | Status: DC
  Administered 2023-12-06 – 2023-12-08 (×5): 2 [IU] via SUBCUTANEOUS
  Administered 2023-12-08 – 2023-12-09 (×2): 1 [IU] via SUBCUTANEOUS

## 2023-12-05 MED ORDER — SENNOSIDES-DOCUSATE SODIUM 8.6-50 MG PO TABS
1.0000 | ORAL_TABLET | Freq: Every evening | ORAL | Status: DC | PRN
  Administered 2023-12-08: 1 via ORAL
  Filled 2023-12-05: qty 1

## 2023-12-05 MED ORDER — CELECOXIB 200 MG PO CAPS
200.0000 mg | ORAL_CAPSULE | Freq: Once | ORAL | Status: AC
  Administered 2023-12-05: 200 mg via ORAL
  Filled 2023-12-05: qty 1

## 2023-12-05 MED ORDER — ACETAMINOPHEN 325 MG PO TABS
650.0000 mg | ORAL_TABLET | Freq: Four times a day (QID) | ORAL | Status: DC | PRN
  Administered 2023-12-09: 650 mg via ORAL
  Filled 2023-12-05 (×2): qty 2

## 2023-12-05 MED ORDER — CHLORHEXIDINE GLUCONATE 4 % EX SOLN
60.0000 mL | Freq: Once | CUTANEOUS | Status: AC
  Administered 2023-12-06: 4 via TOPICAL
  Filled 2023-12-05: qty 60

## 2023-12-05 MED ORDER — HYDROCODONE-ACETAMINOPHEN 5-325 MG PO TABS
1.0000 | ORAL_TABLET | Freq: Four times a day (QID) | ORAL | Status: DC | PRN
  Administered 2023-12-05: 1 via ORAL
  Administered 2023-12-06 – 2023-12-09 (×12): 2 via ORAL
  Filled 2023-12-05 (×9): qty 2
  Filled 2023-12-05: qty 1
  Filled 2023-12-05 (×5): qty 2

## 2023-12-05 MED ORDER — CEFAZOLIN SODIUM-DEXTROSE 2-4 GM/100ML-% IV SOLN
2.0000 g | INTRAVENOUS | Status: AC
  Administered 2023-12-06: 2 g via INTRAVENOUS
  Filled 2023-12-05: qty 100

## 2023-12-05 MED ORDER — SODIUM CHLORIDE 0.9 % IV BOLUS
500.0000 mL | Freq: Once | INTRAVENOUS | Status: AC
  Administered 2023-12-05: 500 mL via INTRAVENOUS

## 2023-12-05 MED ORDER — ONDANSETRON HCL 4 MG/2ML IJ SOLN
4.0000 mg | Freq: Once | INTRAMUSCULAR | Status: AC
  Administered 2023-12-05: 4 mg via INTRAVENOUS
  Filled 2023-12-05: qty 2

## 2023-12-05 NOTE — ED Provider Notes (Signed)
 [place] EMERGENCY DEPARTMENT AT [facility] Provider Note   CSN: [id number] Arrival date & time: 12/05/23  1436     History  Chief Complaint  Patient presents with   Moya Duan is a 73 y.o. female.  HPI Patient presents after fall.  She notes that she was walking, ostensibly in her usual state of health when she felt her right knee with new pain, subsequently she fell and felt her right knee bent behind her awkwardly.  Since that time she has been unable to bear weight, unable to bend the knee secondary to pain.  History is notable for prior knee replacement, in the distant past.  She denies head trauma, pain anywhere other than her right knee and distal thigh.  No distal loss of sensation or weakness.    Home Medications Prior to Admission medications   Medication Sig Start Date End Date Taking? Authorizing Provider  Accu-Chek Softclix Lancets lancets daily. for testing as directed 10/29/19   [provider]  Ascorbic Acid (VITAMIN C) 1000 MG tablet Take 1,000 mg by mouth daily.    [provider]  Blood Glucose Monitoring Suppl (ONETOUCH VERIO IQ SYSTEM) W/DEVICE KIT by Does not apply route.    [provider]  CALCIUM PO Take 1 tablet by mouth daily.    [provider]  cholecalciferol (VITAMIN D3) 25 MCG (1000 UNIT) tablet Take 1,000 Units by mouth daily.    [provider]  cyanocobalamin (VITAMIN B12) 1000 MCG tablet Take 1,000 mcg by mouth daily.    [provider]  ferrous gluconate (FERGON) 324 MG tablet Take 1 tablet (324 mg total) by mouth daily. 09/27/23   Deeann Saint, MD  fluticasone (FLONASE) 50 MCG/ACT nasal spray Place 1 spray into both nostrils daily. Patient taking differently: Place 1 spray into both nostrils daily as needed. 07/14/18   Terressa Koyanagi, DO  furosemide (LASIX) 40 MG tablet Take 0.5 tablets (20 mg total) by mouth daily. 07/29/23   Deeann Saint, MD  gabapentin  (NEURONTIN) 300 MG capsule TAKE 1 CAPSULE BY MOUTH IN THE MORNING, 1 AT LUNCH, AND 2 AT BEDTIME 07/16/23   Deeann Saint, MD  Lancets [facility] DELICA PLUS LANCET30G) MISC USE DAILY AS DIRECTED 03/19/22   Deeann Saint, MD  lisinopril (ZESTRIL) 20 MG tablet TAKE 1 TABLET BY MOUTH EVERY DAY 09/25/23   Deeann Saint, MD  metFORMIN (GLUCOPHAGE-XR) 750 MG 24 hr tablet Take 1 tablet (750 mg total) by mouth daily with breakfast. 10/01/23   Deeann Saint, MD  methocarbamol (ROBAXIN-750) 750 MG tablet Take 1 tablet (750 mg total) by mouth every 6 (six) hours as needed (pain, muscle spasms). 11/05/23   London Sheer, MD  Misc. Devices (ROLLATOR ULTRA-LIGHT) MISC 1 Device by Does not apply route as directed. 04/25/23   Deeann Saint, MD  Omega-3 Fatty Acids (FISH OIL) 1000 MG CAPS Take 1,000 mg by mouth daily.    [provider]  omeprazole (PRILOSEC OTC) 20 MG tablet Take 20 mg by mouth daily as needed (acid reflux).    [provider]  ondansetron (ZOFRAN) 4 MG tablet Take 1 tablet (4 mg total) by mouth every 6 (six) hours as needed for nausea. 09/19/23   London Sheer, MD  [facility] VERIO test strip USE DAILY AS DIRECTED 01/25/21   Deeann Saint, MD  potassium chloride SA (KLOR-CON M) 20 MEQ tablet TAKE 1 TABLET(20 MEQ)  BY MOUTH DAILY 03/25/23   Deeann Saint, MD  promethazine (PHENERGAN) 12.5 MG tablet Take 1 tablet (12.5 mg total) by mouth every 6 (six) hours as needed for nausea or vomiting. 09/20/23   London Sheer, MD  traMADol (ULTRAM) 50 MG tablet Take 1 tablet (50 mg total) by mouth every 6 (six) hours as needed for up to 5 days for severe pain (pain score 7-10). 12/04/23 12/09/23  London Sheer, MD      Allergies    Patient has no known allergies.    Review of Systems   Review of Systems  Physical Exam Updated Vital Signs BP (!) 181/78   Pulse (!) 57   Temp 98 F (36.7 C) (Oral)   Resp 16   Ht 5\' 5"  (1.651 m)   Wt 76.2 kg   SpO2 100%   BMI 27.96  kg/m  Physical Exam Vitals and nursing note reviewed.  Constitutional:      General: She is not in acute distress.    Appearance: She is well-developed.  HENT:     Head: Normocephalic and atraumatic.  Eyes:     Conjunctiva/sclera: Conjunctivae normal.  Cardiovascular:     Rate and Rhythm: Normal rate and regular rhythm.  Pulmonary:     Effort: Pulmonary effort is normal. No respiratory distress.     Breath sounds: Normal breath sounds. No stridor.  Abdominal:     General: There is no distension.  Musculoskeletal:       Arms:  Skin:    General: Skin is warm and dry.  Neurological:     Mental Status: She is alert and oriented to person, place, and time.     Cranial Nerves: No cranial nerve deficit.  Psychiatric:        Mood and Affect: Mood normal.     ED Results / Procedures / Treatments   Labs (all labs ordered are listed, but only abnormal results are displayed) Labs Reviewed  CBC WITH DIFFERENTIAL/PLATELET - Abnormal; Notable for the following components:      Result Value   RBC 3.74 (*)    Hemoglobin 11.3 (*)    HCT 33.5 (*)    All other components within normal limits  COMPREHENSIVE METABOLIC PANEL WITH GFR - Abnormal; Notable for the following components:   Glucose, Bld 109 (*)    All other components within normal limits    EKG None  Radiology DG Knee Complete 4 Views Right Result Date: 12/05/2023 CLINICAL DATA:  Pain after fall. EXAM: RIGHT KNEE - COMPLETE 4+ VIEW COMPARISON:  Radiograph 06/25/2023 FINDINGS: Right knee arthroplasty. Periprosthetic lucency about the femoral component and distal femoral metadiaphysis. Fracture is mildly displaced and minimally angulated. No convincing knee joint effusion or lipohemarthrosis. Patellar baja was present on preoperative exam. Mild soft tissue edema. IMPRESSION: 1. Right knee arthroplasty with periprosthetic lucency about the femoral component and distal femoral metadiaphysis, suspicious for loosening. 2. Mildly  displaced and minimally angulated fracture of the distal femur. Electronically Signed   By: Narda Rutherford M.D.   On: 12/05/2023 16:50    Procedures Procedures    Medications Ordered in ED Medications  HYDROmorphone (DILAUDID) injection 0.5 mg (0.5 mg Intravenous Given 12/05/23 1723)  ondansetron (ZOFRAN) injection 4 mg (4 mg Intravenous Given 12/05/23 1723)  sodium chloride 0.9 % bolus 500 mL (0 mLs Intravenous Stopped 12/05/23 1804)    ED Course/ Medical Decision Making/ A&P  Medical Decision Making Adult female with prior orthopedic procedures presents with acute knee pain following a fall, concern for periprosthetic fracture versus dislocation versus muscle strain.  Patient analgesics, labs x-ray.  X-ray reviewed, discussed with orthopedics given concern for periprosthetic fracture.  Patient will require admission, with surgery anticipated tomorrow.  Patient's denial of other complaints, otherwise reassuring physical exam noted.  Amount and/or Complexity of Data Reviewed Independent Historian:     Details: 2 family members at bedside External Data Reviewed: notes.    Details: Prior Ortho notes Labs: ordered. Decision-making details documented in ED Course. Radiology: ordered and independent interpretation performed. Decision-making details documented in ED Course.  Risk Decision regarding hospitalization.   6:13 PM Surgery posted for tomorrow, patient remains hemodynamically unremarkable, labs unremarkable.  As above, Fx will require surgery.   Final Clinical Impression(s) / ED Diagnoses Final diagnoses:  Fall, initial encounter  Periprosthetic fracture around internal prosthetic right ankle joint, initial encounter     Gerhard Munch, MD 12/05/23 1815

## 2023-12-05 NOTE — Progress Notes (Signed)
 Orthopedic Tech Progress Note Patient Details:  Renee Watts [birth year] [id number]  Ortho Devices Type of Ortho Device: Knee Immobilizer Ortho Device/Splint Location: RLE Ortho Device/Splint Interventions: Application, Ordered, Adjustment   Post Interventions Patient Tolerated: Well Instructions Provided: Care of device  Donald Pore 12/05/2023, 5:57 PM

## 2023-12-05 NOTE — Telephone Encounter (Signed)
 Patient called and said she fell and hurt and bent her right knee back. Its throbbing really bad. CB#[phone]

## 2023-12-05 NOTE — H&P (Signed)
 History and Physical    Renee Watts:[id number] DOB: 1951-04-06 DOA: 12/05/2023  PCP: Deeann Saint, MD  Patient coming from: Home  I have personally briefly reviewed patient's old medical records in [facility]  Chief Complaint: Right knee pain after fall  HPI: Renee Watts is a 73 y.o. female with medical history significant for T2DM, HTN who presented to the ED for evaluation of right knee pain after a fall.  Patient states she normally ambulates with use of a cane.  She was walking outside with her cane when her right knee buckled causing her to fall onto the ground.  She says she actually fell onto her left side but when she tried to move her right leg she felt her right knee bent inwards.  She had significant pain and could not get up.  She did not hit her head or lose consciousness.  She denies any recent chest pain, dyspnea, lightheadedness, dizziness.  ED Course  Labs/Imaging on admission: I have personally reviewed following labs and imaging studies.  Initial vitals showed BP 171/76, pulse 59, RR 15, temp 98.3 F, SpO2 99% on room air.  Labs showed sodium 138, potassium 3.5, bicarb 23, BUN 9, creatinine 0.44, serum glucose 109, LFTs within normal limits, WBC 7.0, hemoglobin 11.3, platelets 153,000.  Right knee x-ray showed mildly displaced and minimally angulated fracture of the distal femur.  Right knee arthroplasty with periprosthetic lucency about the femoral component and distal femoral metadiaphysis, suspicious for loosening also noted.  Patient was given 500 cc normal saline, 0.5 mg IV Dilaudid.  Orthopedics were consulted and recommended medical admission with plan for ORIF tomorrow with Dr. Jena Gauss.  The hospitalist service was consulted to admit.  Review of Systems: All systems reviewed and are negative except as documented in history of present illness above.   Past Medical History:  Diagnosis Date   Allergy    Benign paroxysmal positional  vertigo 06/25/2013   dx with prior PCP, s/p labs, mri and vestibular rehab; nasal spray for etd seemed to help   Blood in stool 03/11/2012   Dependent edema    Bilateral   Depression    GERD (gastroesophageal reflux disease)    on meds   Hyperlipidemia    on meds   Low back pain    sciatica; sees orthopedic specialist ([facility])for this and shoulder tendinitis   Obesity, diabetes, and hypertension syndrome (HCC)    on med   Osteoarthritis, knee    Bilateral, s/p 3 total knee replacement surgerues on right, last 2005. Dr. Shelle Iron   Pellegrini-Steida syndrome    Myositis ossificans   Seasonal allergies    Shingles 07/10/2012   LESIONS CLEARED BUT NOT TOLERATING ANY THING TIGHT AGAINST BODY--SHINGLES WERE AROSS BACK AND ABDOMEN    Past Surgical History:  Procedure Laterality Date   ABDOMINAL HYSTERECTOMY  1984   BLADDER SURGERY  03/2017   bladder tac per pt   COLONOSCOPY  2020   MS-MAC-goly (good)-hems/TAx5)   left shoulder steroid injection     POLYPECTOMY  2020   TA x 5   POSTERIOR FUSION PEDICLE SCREW PLACEMENT N/A 09/17/2023   Procedure: LUMBAR THREE-FOUR LAMINECTOMY AND POSTERIOR INSTRUMENTAL SPINAL FUSION;  Surgeon: London Sheer, MD;  Location: [facility] OR;  Service: Orthopedics;  Laterality: N/A;   TONSILLECTOMY  1977   TOTAL HIP ARTHROPLASTY Right 10/10/2012   Procedure: R TOTAL HIP ARTHROPLASTY ANTERIOR APPROACH;  Surgeon: Kathryne Hitch, MD;  Location: [facility];  Service: Orthopedics;  Laterality: Right;  Right Total Hip Arthroplasty, Anterior Approach (C-Arm)   TOTAL KNEE ARTHROPLASTY Right    with multiple revisions, 2003, 2005   TOTAL KNEE ARTHROPLASTY Left 01/09/2013   Procedure: LEFT TOTAL KNEE ARTHROPLASTY;  Surgeon: Kathryne Hitch, MD;  Location: [facility];  Service: Orthopedics;  Laterality: Left;   TUBAL LIGATION  1978   WISDOM TOOTH EXTRACTION  2013,2014    Social History: Social History   Tobacco Use   Smoking status: Never   Smokeless  tobacco: Never  Vaping Use   Vaping status: Never Used  Substance Use Topics   Alcohol use: No   Drug use: No   No Known Allergies  Family History  Problem Relation Age of Onset   Diabetes Mother    Hypertension Mother    Hypertension Father    Cancer Father        Lung CA   Coronary artery disease Father    Colon cancer Neg Hx    Colon polyps Neg Hx    Esophageal cancer Neg Hx    Rectal cancer Neg Hx    Stomach cancer Neg Hx      Prior to Admission medications   Medication Sig Start Date End Date Taking? Authorizing Provider  Accu-Chek Softclix Lancets lancets daily. for testing as directed 10/29/19   [provider]  Ascorbic Acid (VITAMIN C) 1000 MG tablet Take 1,000 mg by mouth daily.    [provider]  Blood Glucose Monitoring Suppl (ONETOUCH VERIO IQ SYSTEM) W/DEVICE KIT by Does not apply route.    [provider]  CALCIUM PO Take 1 tablet by mouth daily.    [provider]  cholecalciferol (VITAMIN D3) 25 MCG (1000 UNIT) tablet Take 1,000 Units by mouth daily.    [provider]  cyanocobalamin (VITAMIN B12) 1000 MCG tablet Take 1,000 mcg by mouth daily.    [provider]  ferrous gluconate (FERGON) 324 MG tablet Take 1 tablet (324 mg total) by mouth daily. 09/27/23   Deeann Saint, MD  fluticasone (FLONASE) 50 MCG/ACT nasal spray Place 1 spray into both nostrils daily. Patient taking differently: Place 1 spray into both nostrils daily as needed. 07/14/18   Terressa Koyanagi, DO  furosemide (LASIX) 40 MG tablet Take 0.5 tablets (20 mg total) by mouth daily. 07/29/23   Deeann Saint, MD  gabapentin (NEURONTIN) 300 MG capsule TAKE 1 CAPSULE BY MOUTH IN THE MORNING, 1 AT LUNCH, AND 2 AT BEDTIME 07/16/23   Deeann Saint, MD  Lancets [facility] DELICA PLUS LANCET30G) MISC USE DAILY AS DIRECTED 03/19/22   Deeann Saint, MD  lisinopril (ZESTRIL) 20 MG tablet TAKE 1 TABLET BY MOUTH EVERY DAY 09/25/23   Deeann Saint, MD   metFORMIN (GLUCOPHAGE-XR) 750 MG 24 hr tablet Take 1 tablet (750 mg total) by mouth daily with breakfast. 10/01/23   Deeann Saint, MD  methocarbamol (ROBAXIN-750) 750 MG tablet Take 1 tablet (750 mg total) by mouth every 6 (six) hours as needed (pain, muscle spasms). 11/05/23   London Sheer, MD  Misc. Devices (ROLLATOR ULTRA-LIGHT) MISC 1 Device by Does not apply route as directed. 04/25/23   Deeann Saint, MD  Omega-3 Fatty Acids (FISH OIL) 1000 MG CAPS Take 1,000 mg by mouth daily.    [provider]  omeprazole (PRILOSEC OTC) 20 MG tablet Take 20 mg by mouth daily as needed (acid reflux).    [provider]  ondansetron (  ZOFRAN) 4 MG tablet Take 1 tablet (4 mg total) by mouth every 6 (six) hours as needed for nausea. 09/19/23   London Sheer, MD  [facility] VERIO test strip USE DAILY AS DIRECTED 01/25/21   Deeann Saint, MD  potassium chloride SA (KLOR-CON M) 20 MEQ tablet TAKE 1 TABLET(20 MEQ) BY MOUTH DAILY 03/25/23   Deeann Saint, MD  promethazine (PHENERGAN) 12.5 MG tablet Take 1 tablet (12.5 mg total) by mouth every 6 (six) hours as needed for nausea or vomiting. 09/20/23   London Sheer, MD  traMADol (ULTRAM) 50 MG tablet Take 1 tablet (50 mg total) by mouth every 6 (six) hours as needed for up to 5 days for severe pain (pain score 7-10). 12/04/23 12/09/23  London Sheer, MD    Physical Exam: Vitals:   12/05/23 1440 12/05/23 1537 12/05/23 1723 12/05/23 2035  BP:  (!) 173/84 (!) 181/78 138/69  Pulse:  (!) 112 (!) 57 89  Resp:  15 16 18   Temp:  98 F (36.7 C)  98 F (36.7 C)  TempSrc:  Oral  Oral  SpO2:  100%  97%  Weight: 76.2 kg     Height: 5\' 5"  (1.651 m)      Constitutional: Resting in bed, NAD, calm, comfortable Eyes: EOMI, lids and conjunctivae normal ENMT: Mucous membranes are moist. Posterior pharynx clear of any exudate or lesions.Normal dentition.  Neck: normal, supple, no masses. Respiratory: clear to auscultation bilaterally, no  wheezing, no crackles. Normal respiratory effort. No accessory muscle use.  Cardiovascular: Regular rate and rhythm, systolic murmur present. No extremity edema. 2+ pedal pulses. Abdomen: no tenderness, no masses palpated. Musculoskeletal: Right foot everted, right knee immobilizer in place Skin: no rashes, lesions, ulcers. No induration Neurologic: Sensation intact. Strength 5/5 in all 4.  Psychiatric: Normal judgment and insight. Alert and oriented x 3. Normal mood.   EKG: Ordered and pending.  Assessment/Plan Principal Problem:   Closed fracture of distal end of right femur, unspecified fracture morphology, initial encounter (HCC) Active Problems:   Normocytic anemia   Type II diabetes mellitus with neurological manifestations (HCC)   Hypertension associated with diabetes (HCC)   Renee Watts is a 73 y.o. female with medical history significant for T2DM, HTN who is admitted for distal right femur fracture.  Assessment and Plan: Periprosthetic distal right femur fracture: Occurring after mechanical fall.  Orthopedics planning on ORIF tomorrow.  Kept n.p.o. after midnight.  Type 2 diabetes: Holding metformin.  Placed on SSI.  Hypertension: Continue lisinopril.  Normocytic anemia: Hemoglobin stable at 11.3.  Continue to monitor.   DVT prophylaxis: SCDs Start: 12/05/23 1946 Code Status: Full code, confirmed with patient on admission Family Communication: Discussed with patient, she has discussed with family Disposition Plan: From home, dispo pending clinical progress Consults called: Orthopedics Severity of Illness: The appropriate patient status for this patient is INPATIENT. Inpatient status is judged to be reasonable and necessary in order to provide the required intensity of service to ensure the patient's safety. The patient's presenting symptoms, physical exam findings, and initial radiographic and laboratory data in the context of their chronic comorbidities is felt  to place them at high risk for further clinical deterioration. Furthermore, it is not anticipated that the patient will be medically stable for discharge from the hospital within 2 midnights of admission.   * I certify that at the point of admission it is my clinical judgment that the patient will require inpatient hospital care spanning beyond  2 midnights from the point of admission due to high intensity of service, high risk for further deterioration and high frequency of surveillance required.Darreld Mclean MD [facility]  If 7PM-7AM, please contact night-coverage [email or web address]  12/05/2023, 10:43 PM

## 2023-12-05 NOTE — ED Triage Notes (Signed)
 Witnessed mechanical fall in front yard. Residual R knee pain radiating upward afterwards. No obvious deformity per EMS. Unable to bear weight. PMS intact. Took two prescribed tramadol PTA. Did not hit head, no blood thinners, no LOC.

## 2023-12-05 NOTE — ED Provider Triage Note (Signed)
 Emergency Medicine Provider Triage Evaluation Note  Renee Watts , a 73 y.o. female  was evaluated in triage.  Pt complains of fall.  Patient endorsing pain in the right knee after a mechanical fall in which she slipped.  She is concerned that she is unable to bear weight on this knee.  Prior history of knee replacement.  She previous had surgery with Dr. Thad Ranger with follows with Dr. Magnus Ivan for orthopedics.  Review of Systems  Positive: As above  Negative: As above  Physical Exam  BP (!) 171/76 (BP Location: Right Arm)   Pulse (!) 59   Temp 98.3 F (36.8 C) (Oral)   Resp 15   Ht 5\' 5"  (1.651 m)   Wt 76.2 kg   SpO2 99%   BMI 27.96 kg/m  Gen:   Awake, no distress   Resp:  Normal effort  MSK:   Tenderness palpation at the superior portion of the right knee with minimal swelling seen.  Difficulty with flexion extension of the leg due to pain. Other:    Medical Decision Making  Medically screening exam initiated at 3:18 PM.  Appropriate orders placed.  Renee Watts was informed that the remainder of the evaluation will be completed by another provider, this initial triage assessment does not replace that evaluation, and the importance of remaining in the ED until their evaluation is complete.  X-rays performed by radiology after triage order and I can initially see what appears to be at least 2 fracture segments involving the prosthesis at the distal femur.  1 fracture appears to be just superior to the prosthesis while there appears to be bisecting the prosthesis.  Will order on labs and the chance that patient may require a revision with orthopedics based on x-ray interpretation.  Will likely require orthopedics consultation.   Smitty Knudsen, PA-C 12/05/23 1520

## 2023-12-05 NOTE — ED Notes (Signed)
 Called the floor spoke with Charge RN who reported it had not been long enough.  Advised the process is if the room is clean and ready we call and let them know the patient is comin gup.

## 2023-12-05 NOTE — Hospital Course (Signed)
 Renee Watts is a 73 y.o. female with medical history significant for T2DM, HTN who is admitted for distal right femur fracture.

## 2023-12-05 NOTE — Consult Note (Signed)
 Reason for Consult:Right distal femur fx Referring Physician: Gerhard Munch Time called: 1345 Time at bedside: [address] Renee Watts is an 73 y.o. female.  HPI: Renee Watts was in the yard when her right leg gave way and she fell with her leg up under her. She had immediate right knee pain and could not get up. She was brought to the ED where x-rays showed a distal femur fx and orthopedic surgery was consulted. She lives at home with her husband, ambulates with a cane, and does not work.  Past Medical History:  Diagnosis Date   Allergy    Benign paroxysmal positional vertigo 06/25/2013   dx with prior PCP, s/p labs, mri and vestibular rehab; nasal spray for etd seemed to help   Blood in stool 03/11/2012   Dependent edema    Bilateral   Depression    GERD (gastroesophageal reflux disease)    on meds   Hyperlipidemia    on meds   Low back pain    sciatica; sees orthopedic specialist (Renee Watts)for this and shoulder tendinitis   Obesity, diabetes, and hypertension syndrome (HCC)    on med   Osteoarthritis, knee    Bilateral, s/p 3 total knee replacement surgerues on right, last 2005. Dr. Shelle Watts   Pellegrini-Steida syndrome    Myositis ossificans   Seasonal allergies    Shingles 07/10/2012   LESIONS CLEARED BUT NOT TOLERATING ANY THING TIGHT AGAINST BODY--SHINGLES WERE AROSS BACK AND ABDOMEN    Past Surgical History:  Procedure Laterality Date   ABDOMINAL HYSTERECTOMY  1984   BLADDER SURGERY  03/2017   bladder tac per pt   COLONOSCOPY  2020   MS-MAC-goly (good)-hems/TAx5)   left shoulder steroid injection     POLYPECTOMY  2020   TA x 5   POSTERIOR FUSION PEDICLE SCREW PLACEMENT N/A 09/17/2023   Procedure: LUMBAR THREE-FOUR LAMINECTOMY AND POSTERIOR INSTRUMENTAL SPINAL FUSION;  Surgeon: Renee Watts;  Location: [facility];  Service: Orthopedics;  Laterality: N/A;   TONSILLECTOMY  1977   TOTAL HIP ARTHROPLASTY Right 10/10/2012   Procedure: R TOTAL HIP ARTHROPLASTY  ANTERIOR APPROACH;  Surgeon: Renee Watts;  Location: [facility];  Service: Orthopedics;  Laterality: Right;  Right Total Hip Arthroplasty, Anterior Approach (C-Arm)   TOTAL KNEE ARTHROPLASTY Right    with multiple revisions, 2003, 2005   TOTAL KNEE ARTHROPLASTY Left 01/09/2013   Procedure: LEFT TOTAL KNEE ARTHROPLASTY;  Surgeon: Renee Watts;  Location: [facility];  Service: Orthopedics;  Laterality: Left;   TUBAL LIGATION  1978   WISDOM TOOTH EXTRACTION  2013,2014    Family History  Problem Relation Age of Onset   Diabetes Mother    Hypertension Mother    Hypertension Father    Cancer Father        Lung CA   Coronary artery disease Father    Colon cancer Neg Hx    Colon polyps Neg Hx    Esophageal cancer Neg Hx    Rectal cancer Neg Hx    Stomach cancer Neg Hx     Social History:  reports that she has never smoked. She has never used smokeless tobacco. She reports that she does not drink alcohol and does not use drugs.  Allergies: No Known Allergies  Medications: I have reviewed the patient's current medications.  No results found for this or any previous visit (from the past 48 hours).  No results found.  Review of Systems  HENT:  Negative  for ear discharge, ear pain, hearing loss and tinnitus.   Eyes:  Negative for photophobia and pain.  Respiratory:  Negative for cough and shortness of breath.   Cardiovascular:  Negative for chest pain.  Gastrointestinal:  Negative for abdominal pain, nausea and vomiting.  Genitourinary:  Negative for dysuria, flank pain, frequency and urgency.  Musculoskeletal:  Positive for arthralgias (Left knee). Negative for back pain, myalgias and neck pain.  Neurological:  Negative for dizziness and headaches.  Hematological:  Does not bruise/bleed easily.  Psychiatric/Behavioral:  The patient is not nervous/anxious.    Blood pressure (!) 173/84, pulse (!) 112, temperature 98 F (36.7 C), temperature source Oral, resp.  rate 15, height 5\' 5"  (1.651 m), weight 76.2 kg, SpO2 100%. Physical Exam Constitutional:      General: She is not in acute distress.    Appearance: She is well-developed. She is not diaphoretic.  HENT:     Head: Normocephalic and atraumatic.  Eyes:     General: No scleral icterus.       Right eye: No discharge.        Left eye: No discharge.     Conjunctiva/sclera: Conjunctivae normal.  Cardiovascular:     Rate and Rhythm: Normal rate and regular rhythm.  Pulmonary:     Effort: Pulmonary effort is normal. No respiratory distress.  Musculoskeletal:     Cervical back: Normal range of motion.     Comments: RLE No traumatic wounds, ecchymosis, or rash  Mod TTP knee  No ankle effusion  Sens DPN, SPN, TN intact  Motor EHL, ext, flex, evers 5/5  DP 2+, PT 2+, No significant edema  Skin:    General: Skin is warm and dry.  Neurological:     Mental Status: She is alert.  Psychiatric:        Mood and Affect: Mood normal.        Behavior: Behavior normal.     Assessment/Plan: Right distal femur fx -- Plan ORIF tomorrow with Dr. Jena Gauss. Please keep NPO after MN.    Renee Caldron, PA-C Orthopedic Surgery [id number] 12/05/2023, 4:04 PM

## 2023-12-05 NOTE — H&P (View-Only) (Signed)
 Reason for Consult:Right distal femur fx Referring Physician: Gerhard Watts Time called: 1345 Time at bedside: [address] [name] is an 73 y.o. female.  HPI: Renee Watts was in the yard when her right leg gave way and she fell with her leg up under her. She had immediate right knee pain and could not get up. She was brought to the ED where x-rays showed a distal femur fx and orthopedic surgery was consulted. She lives at home with her husband, ambulates with a cane, and does not work.  Past Medical History:  Diagnosis Date   Allergy    Benign paroxysmal positional vertigo 06/25/2013   dx with prior PCP, s/p labs, mri and vestibular rehab; nasal spray for etd seemed to help   Blood in stool 03/11/2012   Dependent edema    Bilateral   Depression    GERD (gastroesophageal reflux disease)    on meds   Hyperlipidemia    on meds   Low back pain    sciatica; sees orthopedic specialist ([facility])for this and shoulder tendinitis   Obesity, diabetes, and hypertension syndrome (HCC)    on med   Osteoarthritis, knee    Bilateral, s/p 3 total knee replacement surgerues on right, last 2005. Dr. Shelle Watts   Pellegrini-Steida syndrome    Myositis ossificans   Seasonal allergies    Shingles 07/10/2012   LESIONS CLEARED BUT NOT TOLERATING ANY THING TIGHT AGAINST BODY--SHINGLES WERE AROSS BACK AND ABDOMEN    Past Surgical History:  Procedure Laterality Date   ABDOMINAL HYSTERECTOMY  1984   BLADDER SURGERY  03/2017   bladder tac per pt   COLONOSCOPY  2020   MS-MAC-goly (good)-hems/TAx5)   left shoulder steroid injection     POLYPECTOMY  2020   TA x 5   POSTERIOR FUSION PEDICLE SCREW PLACEMENT N/A 09/17/2023   Procedure: LUMBAR THREE-FOUR LAMINECTOMY AND POSTERIOR INSTRUMENTAL SPINAL FUSION;  Surgeon: Renee Sheer, MD;  Location: [facility];  Service: Orthopedics;  Laterality: N/A;   TONSILLECTOMY  1977   TOTAL HIP ARTHROPLASTY Right 10/10/2012   Procedure: R TOTAL HIP ARTHROPLASTY  ANTERIOR APPROACH;  Surgeon: Renee Hitch, MD;  Location: [facility];  Service: Orthopedics;  Laterality: Right;  Right Total Hip Arthroplasty, Anterior Approach (C-Arm)   TOTAL KNEE ARTHROPLASTY Right    with multiple revisions, 2003, 2005   TOTAL KNEE ARTHROPLASTY Left 01/09/2013   Procedure: LEFT TOTAL KNEE ARTHROPLASTY;  Surgeon: Renee Hitch, MD;  Location: [facility];  Service: Orthopedics;  Laterality: Left;   TUBAL LIGATION  1978   WISDOM TOOTH EXTRACTION  2013,2014    Family History  Problem Relation Age of Onset   Diabetes Mother    Hypertension Mother    Hypertension Father    Cancer Father        Lung CA   Coronary artery disease Father    Colon cancer Neg Hx    Colon polyps Neg Hx    Esophageal cancer Neg Hx    Rectal cancer Neg Hx    Stomach cancer Neg Hx     Social History:  reports that she has never smoked. She has never used smokeless tobacco. She reports that she does not drink alcohol and does not use drugs.  Allergies: No Known Allergies  Medications: I have reviewed the patient's current medications.  No results found for this or any previous visit (from the past 48 hours).  No results found.  Review of Systems  HENT:  Negative  for ear discharge, ear pain, hearing loss and tinnitus.   Eyes:  Negative for photophobia and pain.  Respiratory:  Negative for cough and shortness of breath.   Cardiovascular:  Negative for chest pain.  Gastrointestinal:  Negative for abdominal pain, nausea and vomiting.  Genitourinary:  Negative for dysuria, flank pain, frequency and urgency.  Musculoskeletal:  Positive for arthralgias (Left knee). Negative for back pain, myalgias and neck pain.  Neurological:  Negative for dizziness and headaches.  Hematological:  Does not bruise/bleed easily.  Psychiatric/Behavioral:  The patient is not nervous/anxious.    Blood pressure (!) 173/84, pulse (!) 112, temperature 98 F (36.7 C), temperature source Oral, resp.  rate 15, height 5\' 5"  (1.651 m), weight 76.2 kg, SpO2 100%. Physical Exam Constitutional:      General: She is not in acute distress.    Appearance: She is well-developed. She is not diaphoretic.  HENT:     Head: Normocephalic and atraumatic.  Eyes:     General: No scleral icterus.       Right eye: No discharge.        Left eye: No discharge.     Conjunctiva/sclera: Conjunctivae normal.  Cardiovascular:     Rate and Rhythm: Normal rate and regular rhythm.  Pulmonary:     Effort: Pulmonary effort is normal. No respiratory distress.  Musculoskeletal:     Cervical back: Normal range of motion.     Comments: RLE No traumatic wounds, ecchymosis, or rash  Mod TTP knee  No ankle effusion  Sens DPN, SPN, TN intact  Motor EHL, ext, flex, evers 5/5  DP 2+, PT 2+, No significant edema  Skin:    General: Skin is warm and dry.  Neurological:     Mental Status: She is alert.  Psychiatric:        Mood and Affect: Mood normal.        Behavior: Behavior normal.     Assessment/Plan: Right distal femur fx -- Plan ORIF tomorrow with Dr. Jena Watts. Please keep NPO after MN.    Renee Caldron, PA-C Orthopedic Surgery [id number] 12/05/2023, 4:04 PM

## 2023-12-06 ENCOUNTER — Inpatient Hospital Stay (HOSPITAL_COMMUNITY)

## 2023-12-06 ENCOUNTER — Other Ambulatory Visit: Payer: Self-pay

## 2023-12-06 ENCOUNTER — Encounter (HOSPITAL_COMMUNITY)
Admission: EM | Disposition: A | Discharge status: Home Health | Payer: Self-pay | Source: Home / Self Care | Attending: Internal Medicine

## 2023-12-06 ENCOUNTER — Inpatient Hospital Stay (HOSPITAL_COMMUNITY): Admitting: Anesthesiology

## 2023-12-06 ENCOUNTER — Encounter (HOSPITAL_COMMUNITY): Payer: Self-pay | Admitting: Internal Medicine

## 2023-12-06 DIAGNOSIS — S72401A Unspecified fracture of lower end of right femur, initial encounter for closed fracture: Secondary | ICD-10-CM

## 2023-12-06 HISTORY — PX: ORIF FEMUR FRACTURE: SHX2119

## 2023-12-06 LAB — CBC
HCT: 30.1 % — ABNORMAL LOW (ref 36.0–46.0)
HCT: 31.6 % — ABNORMAL LOW (ref 36.0–46.0)
Hemoglobin: 10.4 g/dL — ABNORMAL LOW (ref 12.0–15.0)
Hemoglobin: 11 g/dL — ABNORMAL LOW (ref 12.0–15.0)
MCH: 30.8 pg (ref 26.0–34.0)
MCH: 30.8 pg (ref 26.0–34.0)
MCHC: 34.6 g/dL (ref 30.0–36.0)
MCHC: 34.8 g/dL (ref 30.0–36.0)
MCV: 89.1 fL (ref 80.0–100.0)
MCV: 88.5 fL (ref 80.0–100.0)
Platelets: 143 10*3/uL — ABNORMAL LOW (ref 150–400)
Platelets: 150 10*3/uL (ref 150–400)
RBC: 3.38 MIL/uL — ABNORMAL LOW (ref 3.87–5.11)
RBC: 3.57 MIL/uL — ABNORMAL LOW (ref 3.87–5.11)
RDW: 13.5 % (ref 11.5–15.5)
RDW: 13.4 % (ref 11.5–15.5)
WBC: 5.5 10*3/uL (ref 4.0–10.5)
WBC: 8.6 10*3/uL (ref 4.0–10.5)
nRBC: 0 % (ref 0.0–0.2)
nRBC: 0 % (ref 0.0–0.2)

## 2023-12-06 LAB — BASIC METABOLIC PANEL WITH GFR
Anion gap: 10 (ref 5–15)
BUN: 6 mg/dL — ABNORMAL LOW (ref 8–23)
CO2: 25 mmol/L (ref 22–32)
Calcium: 9.1 mg/dL (ref 8.9–10.3)
Chloride: 107 mmol/L (ref 98–111)
Creatinine, Ser: 0.44 mg/dL (ref 0.44–1.00)
GFR, Estimated: >60 mL/min (ref >60)
Glucose, Bld: 119 mg/dL — ABNORMAL HIGH (ref 70–99)
Potassium: 3.7 mmol/L (ref 3.5–5.1)
Sodium: 142 mmol/L (ref 135–145)

## 2023-12-06 LAB — SURGICAL PCR SCREEN
MRSA, PCR: NEGATIVE
Staphylococcus aureus: NEGATIVE

## 2023-12-06 LAB — GLUCOSE, CAPILLARY
Glucose-Capillary: 110 mg/dL — ABNORMAL HIGH (ref 70–99)
Glucose-Capillary: 92 mg/dL (ref 70–99)
Glucose-Capillary: 135 mg/dL — ABNORMAL HIGH (ref 70–99)
Glucose-Capillary: 171 mg/dL — ABNORMAL HIGH (ref 70–99)

## 2023-12-06 LAB — CREATININE, SERUM
Creatinine, Ser: 0.42 mg/dL — ABNORMAL LOW (ref 0.44–1.00)
GFR, Estimated: >60 mL/min (ref >60)

## 2023-12-06 SURGERY — OPEN REDUCTION INTERNAL FIXATION (ORIF) DISTAL FEMUR FRACTURE
Anesthesia: General | Site: Leg Upper | Laterality: Right

## 2023-12-06 MED ORDER — SODIUM CHLORIDE 0.9 % IV SOLN: INTRAVENOUS | Status: DC

## 2023-12-06 MED ORDER — LIDOCAINE 2% (20 MG/ML) 5 ML SYRINGE
INTRAMUSCULAR | Status: AC
  Filled 2023-12-06: qty 5

## 2023-12-06 MED ORDER — PROPOFOL 10 MG/ML IV BOLUS
INTRAVENOUS | Status: DC | PRN
  Administered 2023-12-06: 90 mg via INTRAVENOUS

## 2023-12-06 MED ORDER — HYDROMORPHONE HCL 1 MG/ML IJ SOLN
0.2500 mg | INTRAMUSCULAR | Status: DC | PRN
  Administered 2023-12-06 (×3): 0.5 mg via INTRAVENOUS

## 2023-12-06 MED ORDER — PROPOFOL 10 MG/ML IV BOLUS
INTRAVENOUS | Status: AC
  Filled 2023-12-06: qty 20

## 2023-12-06 MED ORDER — ADULT MULTIVITAMIN W/MINERALS CH
1.0000 | ORAL_TABLET | Freq: Every day | ORAL | Status: DC
  Administered 2023-12-07 – 2023-12-09 (×3): 1 via ORAL
  Filled 2023-12-06 (×3): qty 1

## 2023-12-06 MED ORDER — SUGAMMADEX SODIUM 200 MG/2ML IV SOLN
INTRAVENOUS | Status: DC | PRN
Start: 2023-12-06 — End: 2023-12-09
  Administered 2023-12-06: 200 mg via INTRAVENOUS

## 2023-12-06 MED ORDER — DIPHENHYDRAMINE HCL 12.5 MG/5ML PO ELIX: 12.5000 mg | ORAL_SOLUTION | ORAL | Status: DC | PRN

## 2023-12-06 MED ORDER — DEXAMETHASONE SODIUM PHOSPHATE 10 MG/ML IJ SOLN
INTRAMUSCULAR | Status: DC | PRN
  Administered 2023-12-06: 8 mg via INTRAVENOUS

## 2023-12-06 MED ORDER — DEXAMETHASONE SODIUM PHOSPHATE 10 MG/ML IJ SOLN
INTRAMUSCULAR | Status: AC
  Filled 2023-12-06: qty 1

## 2023-12-06 MED ORDER — FENTANYL CITRATE (PF) 250 MCG/5ML IJ SOLN
INTRAMUSCULAR | Status: AC
  Filled 2023-12-06: qty 5

## 2023-12-06 MED ORDER — CEFAZOLIN SODIUM-DEXTROSE 2-4 GM/100ML-% IV SOLN
2.0000 g | Freq: Three times a day (TID) | INTRAVENOUS | Status: AC
  Administered 2023-12-06 – 2023-12-07 (×3): 2 g via INTRAVENOUS
  Filled 2023-12-06 (×3): qty 100

## 2023-12-06 MED ORDER — OXYCODONE HCL 5 MG PO TABS
ORAL_TABLET | ORAL | Status: AC
  Filled 2023-12-06: qty 1

## 2023-12-06 MED ORDER — ROCURONIUM BROMIDE 10 MG/ML (PF) SYRINGE
PREFILLED_SYRINGE | INTRAVENOUS | Status: DC | PRN
  Administered 2023-12-06: 50 mg via INTRAVENOUS

## 2023-12-06 MED ORDER — ACETAMINOPHEN 10 MG/ML IV SOLN
1000.0000 mg | Freq: Once | INTRAVENOUS | Status: DC | PRN
  Administered 2023-12-06: 1000 mg via INTRAVENOUS

## 2023-12-06 MED ORDER — FENTANYL CITRATE (PF) 250 MCG/5ML IJ SOLN
INTRAMUSCULAR | Status: DC | PRN
  Administered 2023-12-06: 50 ug via INTRAVENOUS
  Administered 2023-12-06: 25 ug via INTRAVENOUS
  Administered 2023-12-06: 100 ug via INTRAVENOUS

## 2023-12-06 MED ORDER — OXYCODONE HCL 5 MG/5ML PO SOLN: 5.0000 mg | Freq: Once | ORAL | Status: DC | PRN

## 2023-12-06 MED ORDER — ORAL CARE MOUTH RINSE: 15.0000 mL | Freq: Once | OROMUCOSAL | Status: AC

## 2023-12-06 MED ORDER — KETOROLAC TROMETHAMINE 30 MG/ML IJ SOLN
30.0000 mg | Freq: Once | INTRAMUSCULAR | Status: AC | PRN
  Administered 2023-12-06: 30 mg via INTRAVENOUS

## 2023-12-06 MED ORDER — ROCURONIUM BROMIDE 10 MG/ML (PF) SYRINGE
PREFILLED_SYRINGE | INTRAVENOUS | Status: AC
  Filled 2023-12-06: qty 10

## 2023-12-06 MED ORDER — ENSURE ENLIVE PO LIQD
237.0000 mL | Freq: Two times a day (BID) | ORAL | Status: DC
  Administered 2023-12-07 – 2023-12-08 (×4): 237 mL via ORAL

## 2023-12-06 MED ORDER — METHOCARBAMOL 1000 MG/10ML IJ SOLN: 500.0000 mg | Freq: Four times a day (QID) | INTRAMUSCULAR | Status: DC | PRN

## 2023-12-06 MED ORDER — OXYCODONE HCL 5 MG/5ML PO SOLN: 5.0000 mg | Freq: Once | ORAL | Status: AC | PRN

## 2023-12-06 MED ORDER — CHLORHEXIDINE GLUCONATE 0.12 % MT SOLN
OROMUCOSAL | Status: AC
  Administered 2023-12-06: 15 mL via OROMUCOSAL
  Filled 2023-12-06: qty 15

## 2023-12-06 MED ORDER — METOCLOPRAMIDE HCL 5 MG PO TABS: 5.0000 mg | ORAL_TABLET | Freq: Three times a day (TID) | ORAL | Status: DC | PRN

## 2023-12-06 MED ORDER — HYDROMORPHONE HCL 1 MG/ML IJ SOLN
INTRAMUSCULAR | Status: AC
  Filled 2023-12-06: qty 1

## 2023-12-06 MED ORDER — DOCUSATE SODIUM 100 MG PO CAPS
100.0000 mg | ORAL_CAPSULE | Freq: Two times a day (BID) | ORAL | Status: DC
  Administered 2023-12-06 – 2023-12-08 (×5): 100 mg via ORAL
  Filled 2023-12-06 (×6): qty 1

## 2023-12-06 MED ORDER — ONDANSETRON HCL 4 MG/2ML IJ SOLN: 4.0000 mg | Freq: Once | INTRAMUSCULAR | Status: DC | PRN

## 2023-12-06 MED ORDER — FENTANYL CITRATE (PF) 100 MCG/2ML IJ SOLN
25.0000 ug | INTRAMUSCULAR | Status: DC | PRN
  Administered 2023-12-06 (×3): 50 ug via INTRAVENOUS

## 2023-12-06 MED ORDER — LACTATED RINGERS IV SOLN: INTRAVENOUS | Status: DC

## 2023-12-06 MED ORDER — DROPERIDOL 2.5 MG/ML IJ SOLN
INTRAMUSCULAR | Status: AC
  Filled 2023-12-06: qty 2

## 2023-12-06 MED ORDER — KETOROLAC TROMETHAMINE 30 MG/ML IJ SOLN
INTRAMUSCULAR | Status: AC
  Filled 2023-12-06: qty 1

## 2023-12-06 MED ORDER — METHOCARBAMOL 500 MG PO TABS
500.0000 mg | ORAL_TABLET | Freq: Four times a day (QID) | ORAL | Status: DC | PRN
  Administered 2023-12-06 – 2023-12-09 (×8): 500 mg via ORAL
  Filled 2023-12-06 (×8): qty 1

## 2023-12-06 MED ORDER — ONDANSETRON HCL 4 MG/2ML IJ SOLN
INTRAMUSCULAR | Status: AC
  Filled 2023-12-06: qty 2

## 2023-12-06 MED ORDER — FENTANYL CITRATE (PF) 100 MCG/2ML IJ SOLN
INTRAMUSCULAR | Status: AC
  Filled 2023-12-06: qty 2

## 2023-12-06 MED ORDER — DROPERIDOL 2.5 MG/ML IJ SOLN
0.6250 mg | Freq: Once | INTRAMUSCULAR | Status: AC
  Administered 2023-12-06: 0.625 mg via INTRAVENOUS

## 2023-12-06 MED ORDER — ONDANSETRON HCL 4 MG/2ML IJ SOLN
4.0000 mg | Freq: Four times a day (QID) | INTRAMUSCULAR | Status: DC | PRN
  Administered 2023-12-06: 4 mg via INTRAVENOUS
  Filled 2023-12-06: qty 2

## 2023-12-06 MED ORDER — VANCOMYCIN HCL 1000 MG IV SOLR
INTRAVENOUS | Status: DC | PRN
  Administered 2023-12-06: 1000 mg via TOPICAL

## 2023-12-06 MED ORDER — 0.9 % SODIUM CHLORIDE (POUR BTL) OPTIME
TOPICAL | Status: DC | PRN
  Administered 2023-12-06: 1000 mL

## 2023-12-06 MED ORDER — ACETAMINOPHEN 10 MG/ML IV SOLN
INTRAVENOUS | Status: AC
  Filled 2023-12-06: qty 100

## 2023-12-06 MED ORDER — ENOXAPARIN SODIUM 40 MG/0.4ML IJ SOSY
40.0000 mg | PREFILLED_SYRINGE | INTRAMUSCULAR | Status: DC
  Administered 2023-12-07 – 2023-12-09 (×3): 40 mg via SUBCUTANEOUS
  Filled 2023-12-06 (×3): qty 0.4

## 2023-12-06 MED ORDER — ONDANSETRON HCL 4 MG/2ML IJ SOLN
INTRAMUSCULAR | Status: DC | PRN
  Administered 2023-12-06: 4 mg via INTRAVENOUS

## 2023-12-06 MED ORDER — HYDROMORPHONE HCL 1 MG/ML IJ SOLN
0.2500 mg | INTRAMUSCULAR | Status: DC | PRN
  Administered 2023-12-06 (×4): 0.5 mg via INTRAVENOUS

## 2023-12-06 MED ORDER — VANCOMYCIN HCL 1000 MG IV SOLR
INTRAVENOUS | Status: AC
  Filled 2023-12-06: qty 20

## 2023-12-06 MED ORDER — TRANEXAMIC ACID-NACL 1000-0.7 MG/100ML-% IV SOLN
1000.0000 mg | Freq: Once | INTRAVENOUS | Status: AC
  Administered 2023-12-06: 1000 mg via INTRAVENOUS
  Filled 2023-12-06: qty 100

## 2023-12-06 MED ORDER — OXYCODONE HCL 5 MG PO TABS
5.0000 mg | ORAL_TABLET | Freq: Once | ORAL | Status: AC | PRN
  Administered 2023-12-06: 5 mg via ORAL

## 2023-12-06 MED ORDER — OXYCODONE HCL 5 MG PO TABS: 5.0000 mg | ORAL_TABLET | Freq: Once | ORAL | Status: DC | PRN

## 2023-12-06 MED ORDER — CHLORHEXIDINE GLUCONATE 0.12 % MT SOLN
15.0000 mL | Freq: Once | OROMUCOSAL | Status: AC
  Administered 2023-12-06: 15 mL via OROMUCOSAL

## 2023-12-06 MED ORDER — CHLORHEXIDINE GLUCONATE 0.12 % MT SOLN: 15.0000 mL | Freq: Once | OROMUCOSAL | Status: AC

## 2023-12-06 MED ORDER — LIDOCAINE 2% (20 MG/ML) 5 ML SYRINGE
INTRAMUSCULAR | Status: DC | PRN
  Administered 2023-12-06: 100 mg via INTRAVENOUS

## 2023-12-06 MED ORDER — METOCLOPRAMIDE HCL 5 MG/ML IJ SOLN: 5.0000 mg | Freq: Three times a day (TID) | INTRAMUSCULAR | Status: DC | PRN

## 2023-12-06 MED ORDER — ONDANSETRON HCL 4 MG PO TABS: 4.0000 mg | ORAL_TABLET | Freq: Four times a day (QID) | ORAL | Status: DC | PRN

## 2023-12-06 MED ORDER — HYDROMORPHONE HCL 1 MG/ML IJ SOLN
INTRAMUSCULAR | Status: AC
Start: 2023-12-06 — End: 2023-12-07
  Filled 2023-12-06: qty 1

## 2023-12-06 SURGICAL SUPPLY — 65 items
BAG COUNTER SPONGE SURGICOUNT (BAG) ×2 IMPLANT
BIT DRILL 4.3X300MM (BIT) IMPLANT
BIT DRILL NCB-PP FEMUR MIS 3 (DRILL) IMPLANT
BIT DRILL QC 3.3X195 (BIT) IMPLANT
BLADE CLIPPER SURG (BLADE) IMPLANT
BNDG COHESIVE 6X5 TAN ST LF (GAUZE/BANDAGES/DRESSINGS) ×2 IMPLANT
BNDG ELASTIC 4INX 5YD STR LF (GAUZE/BANDAGES/DRESSINGS) IMPLANT
BNDG ELASTIC 6INX 5YD STR LF (GAUZE/BANDAGES/DRESSINGS) IMPLANT
BNDG ELASTIC 6X10 VLCR STRL LF (GAUZE/BANDAGES/DRESSINGS) ×2 IMPLANT
BRUSH SCRUB EZ PLAIN DRY (MISCELLANEOUS) ×4 IMPLANT
CANISTER SUCT 3000ML PPV (MISCELLANEOUS) ×2 IMPLANT
CAP LOCK NCB (Cap) IMPLANT
CHLORAPREP W/TINT 26 (MISCELLANEOUS) ×2 IMPLANT
COVER SURGICAL LIGHT HANDLE (MISCELLANEOUS) ×2 IMPLANT
DERMABOND ADVANCED .7 DNX12 (GAUZE/BANDAGES/DRESSINGS) IMPLANT
DRAPE C-ARM 42X72 X-RAY (DRAPES) ×2 IMPLANT
DRAPE C-ARMOR (DRAPES) ×2 IMPLANT
DRAPE HALF SHEET 40X57 (DRAPES) ×4 IMPLANT
DRAPE SURG 17X23 STRL (DRAPES) ×2 IMPLANT
DRAPE SURG ORHT 6 SPLT 77X108 (DRAPES) ×4 IMPLANT
DRAPE U-SHAPE 47X51 STRL (DRAPES) ×2 IMPLANT
DRESSING MEPILEX FLEX 4X4 (GAUZE/BANDAGES/DRESSINGS) IMPLANT
DRILL NCB-PP FEMUR MIS 3 (DRILL) ×1 IMPLANT
DRSG ADAPTIC 3X8 NADH LF (GAUZE/BANDAGES/DRESSINGS) IMPLANT
DRSG MEPILEX FLEX 4X4 (GAUZE/BANDAGES/DRESSINGS) IMPLANT
DRSG MEPILEX POST OP 4X12 (GAUZE/BANDAGES/DRESSINGS) IMPLANT
DRSG MEPILEX POST OP 4X8 (GAUZE/BANDAGES/DRESSINGS) IMPLANT
ELECT REM PT RETURN 9FT ADLT (ELECTROSURGICAL) ×1 IMPLANT
ELECTRODE REM PT RTRN 9FT ADLT (ELECTROSURGICAL) ×2 IMPLANT
GAUZE PAD ABD 8X10 STRL (GAUZE/BANDAGES/DRESSINGS) ×6 IMPLANT
GAUZE SPONGE 4X4 12PLY STRL (GAUZE/BANDAGES/DRESSINGS) ×2 IMPLANT
GLOVE BIO SURGEON STRL SZ 6.5 (GLOVE) ×6 IMPLANT
GLOVE BIO SURGEON STRL SZ7.5 (GLOVE) ×8 IMPLANT
GLOVE BIOGEL PI IND STRL 6.5 (GLOVE) ×2 IMPLANT
GLOVE BIOGEL PI IND STRL 7.5 (GLOVE) ×2 IMPLANT
GOWN STRL REUS W/ TWL LRG LVL3 (GOWN DISPOSABLE) ×6 IMPLANT
K-WIRE FXSTD 280X2XNS SS (WIRE) ×1 IMPLANT
KIT BASIN OR (CUSTOM PROCEDURE TRAY) ×2 IMPLANT
KIT TURNOVER KIT B (KITS) ×2 IMPLANT
KWIRE FXSTD 280X2XNS SS (WIRE) IMPLANT
NS IRRIG 1000ML POUR BTL (IV SOLUTION) ×2 IMPLANT
PACK TOTAL JOINT (CUSTOM PROCEDURE TRAY) ×2 IMPLANT
PAD ARMBOARD POSITIONER FOAM (MISCELLANEOUS) ×2 IMPLANT
PAD CAST 4YDX4 CTTN HI CHSV (CAST SUPPLIES) ×2 IMPLANT
PADDING CAST ABS COTTON 4X4 ST (CAST SUPPLIES) IMPLANT
PADDING CAST COTTON 6X4 STRL (CAST SUPPLIES) ×2 IMPLANT
PLATE DISTAL FEMUR 15H 317M RT (Plate) IMPLANT
SCREW 5.0 70MM (Screw) IMPLANT
SCREW CORTICAL NCB 5.0X65 (Screw) IMPLANT
SCREW NCB 3.5X75X5X6.2XST (Screw) IMPLANT
SCREW NCB 4.0X36MM (Screw) IMPLANT
SCREW NCB 5.0X38 (Screw) IMPLANT
SCREW UNICORTICAL 5.0X14 (Screw) IMPLANT
SPONGE T-LAP 18X18 ~~LOC~~+RFID (SPONGE) IMPLANT
STAPLER VISISTAT 35W (STAPLE) ×2 IMPLANT
SUCTION TUBE FRAZIER 10FR DISP (SUCTIONS) ×2 IMPLANT
SUT ETHILON 3 0 PS 1 (SUTURE) ×4 IMPLANT
SUT MNCRL AB 3-0 PS2 27 (SUTURE) IMPLANT
SUT MON AB 2-0 CT1 36 (SUTURE) IMPLANT
SUT VIC AB 0 CT1 27XBRD ANBCTR (SUTURE) IMPLANT
SUT VIC AB 1 CT1 27XBRD ANBCTR (SUTURE) IMPLANT
SUT VIC AB 2-0 CT1 TAPERPNT 27 (SUTURE) ×4 IMPLANT
TOWEL GREEN STERILE (TOWEL DISPOSABLE) ×4 IMPLANT
TRAY FOLEY MTR SLVR 16FR STAT (SET/KITS/TRAYS/PACK) IMPLANT
WATER STERILE IRR 1000ML POUR (IV SOLUTION) ×4 IMPLANT

## 2023-12-06 NOTE — Anesthesia Preprocedure Evaluation (Addendum)
 Anesthesia Evaluation  Patient identified by MRN, date of birth, ID band Patient awake    Reviewed: Allergy & Precautions, NPO status , Patient's Chart, lab work & pertinent test results, reviewed documented beta blocker date and time   Airway Mallampati: II  TM Distance: >3 FB     Dental no notable dental hx.    Pulmonary    breath sounds clear to auscultation       Cardiovascular hypertension,  Rhythm:Regular Rate:Normal     Neuro/Psych  PSYCHIATRIC DISORDERS  Depression     Neuromuscular disease    GI/Hepatic ,GERD  ,,  Endo/Other  diabetes, Type 2    Renal/GU      Musculoskeletal  (+) Arthritis ,    Abdominal   Peds  Hematology  (+) Blood dyscrasia, anemia   Anesthesia Other Findings   Reproductive/Obstetrics                             Anesthesia Physical Anesthesia Plan  ASA: 2  Anesthesia Plan: General   Post-op Pain Management:    Induction: Intravenous  PONV Risk Score and Plan: 2 and Ondansetron and Dexamethasone  Airway Management Planned: Oral ETT  Additional Equipment:   Intra-op Plan:   Post-operative Plan: Extubation in OR  Informed Consent: I have reviewed the patients History and Physical, chart, labs and discussed the procedure including the risks, benefits and alternatives for the proposed anesthesia with the patient or authorized representative who has indicated his/her understanding and acceptance.     Dental advisory given  Plan Discussed with: CRNA  Anesthesia Plan Comments:        Anesthesia Quick Evaluation

## 2023-12-06 NOTE — Interval H&P Note (Signed)
 History and Physical Interval Note:  12/06/2023 10:32 AM  Renee Watts  has presented today for surgery, with the diagnosis of Right Distal Femur Fracture.  The various methods of treatment have been discussed with the patient and family. After consideration of risks, benefits and other options for treatment, the patient has consented to  Procedure(s): OPEN REDUCTION INTERNAL FIXATION (ORIF) DISTAL FEMUR FRACTURE (Right) as a surgical intervention.  The patient's history has been reviewed, patient examined, no change in status, stable for surgery.  I have reviewed the patient's chart and labs.  Questions were answered to the patient's satisfaction.     Caryn Bee P Marquell Saenz

## 2023-12-06 NOTE — Op Note (Signed)
 Orthopaedic Surgery Operative Note (CSN: [id number] ) Date of Surgery: 12/06/2023  Admit Date: 12/05/2023   Diagnoses: Pre-Op Diagnoses: Right periprosthetic supracondylar distal femur fracture  Post-Op Diagnosis: Same  Procedures: CPT 27511-Open reduction internal fixation of right supracondylar distal femur fracture  Surgeons : Primary: Roby Lofts, MD  Assistant: Thyra Breed, PA-C  Location: OR 5   Anesthesia: General   Antibiotics: Ancef 2g preop with 1 gm vancomycin powder placed topically   Tourniquet time: None    Estimated Blood Loss: Minimal  Complications: None  Specimens:* No specimens in log *   Implants: Implant Name Type Inv. Item Serial No. Manufacturer Lot No. LRB No. Used Action  CAP LOCK NCB - [id number] Cap CAP LOCK NCB  [employer](ORTH,TRAU,BIO,SG)  Right 6 Implanted  PLATE DISTAL FEMUR 15H 317M RT - [id number] Plate PLATE DISTAL FEMUR 15H 317M RT  [employer](ORTH,TRAU,BIO,SG)  Right 1 Implanted  SCREW UNICORTICAL 5.0X14 - [id number] Screw SCREW UNICORTICAL 5.0X14  [employer](ORTH,TRAU,BIO,SG)  Right 1 Implanted  SCREW 5.0 - [id number] Screw SCREW 5.0  [employer](ORTH,TRAU,BIO,SG)  Right 2 Implanted  SCREW CORTICAL NCB 5.0X65 - [id number] Screw SCREW CORTICAL NCB 5.0X65  [employer](ORTH,TRAU,BIO,SG)  Right 1 Implanted  SCREW NCB 5.0X38 - [id number] Screw SCREW NCB 5.0X38  [employer](ORTH,TRAU,BIO,SG)  Right 2 Implanted  SCREW NCB 1.8A41Y6A6.2XST - [id number] Screw SCREW NCB [id number]  [employer](ORTH,TRAU,BIO,SG)  Right 2 Implanted  SCREW NCB 4.0X36MM - [id number] Screw SCREW NCB 4.0X36MM  [employer](ORTH,TRAU,BIO,SG)  Right 1 Implanted     Indications for Surgery: 73 year old female who sustained a ground-level fall with a right supracondylar periprosthetic distal femur fracture.  Due to the unstable nature of her injury I recommend proceeding with open reduction internal fixation.  Risk and benefits were discussed  with the patient and her daughter.  Risks include but not limited to bleeding, infection, malunion, nonunion, hardware failure, hardware irritation, nerve and blood vessel injury, DVT, even the possibility anesthetic complications.  She agreed to proceed with surgery and consent was obtained.  Operative Findings: 1.  Open duction internal fixation of right supracondylar distal femur fracture using [employer] NCB distal femoral locking plate.  Procedure: The patient was identified in the preoperative holding area. Consent was confirmed with the patient and their family and all questions were answered. The operative extremity was marked after confirmation with the patient. she was then brought back to the operating room by our anesthesia colleagues.  She was placed under general anesthetic and carefully transferred over to radiolucent flattop table.  A bump was placed under her operative hip.  The right lower extremity was then prepped and draped in usual sterile fashion.  A timeout was performed to verify the patient, the procedure, and the extremity.  Preoperative antibiotics were dosed.  Fluoroscopic imaging showed the unstable nature of her injury.  The hip and knee were flexed over a triangle.  A lateral approach was made and carried down through skin and subcutaneous tissue.  I incised through the IT band and mobilized the vastus lateralis off the lateral condyle of the femur.  I exposed the condyle and then proceeded to choose a 15 hole [employer] NCB distal femoral locking plate.  I attached this to the targeting arm and slid it submuscularly along the lateral cortex of the femur.  I then held the distal portion in line with a 2.0 mm guidewire.  I then percutaneously placed a 3.3 mm drill bit through the targeting arm to align the  proximal portion of the plate.  I then placed 5.0 millimeter screws distally to bring the plate flush to bone.  I then used the targeting arm to place 5.0 millimeter  screws in the femoral shaft.  I placed a nuchal close screw in the most proximal hole as a bridge to the hip replacement.  I then removed the 3.3 mm drill bit and placed a 4.0 millimeter screw.  A locking cap was placed on the unicortical screw.  The targeting arm was removed and a return to the distal segment and placed a total of five 5.0 millimeter screws.  Locking caps were placed in all of the distal screws.  Final fluoroscopic imaging was obtained.  The incision was copiously irrigated.  A gram of vancomycin powder was placed into the incision.  A layered closure of #1 Vicryl, 2-0 Monocryl and 3-0 Monocryl with Dermabond was used to seal the skin.  Sterile dressings were applied.  The patient was then awoke from anesthesia and taken to the PACU in stable condition.  Post Op Plan/Instructions: Patient be weightbearing as tolerated to the right lower extremity.  She will have no range of motion restrictions.  We will have her mobilize with physical occupational therapy.  She will receive postoperative Ancef.  She will be placed on Lovenox for DVT prophylaxis and discharged on aspirin 325 mg.  I was present and performed the entire surgery.  Thyra Breed, PA-C did assist me throughout the case. An assistant was necessary given the difficulty in approach, maintenance of reduction and ability to instrument the fracture.   Truitt Merle, MD [facility]

## 2023-12-06 NOTE — Progress Notes (Signed)
 Initial Nutrition Assessment  DOCUMENTATION CODES:   Not applicable  INTERVENTION:   -Once diet is advanced, add:   -Ensure Enlive po BID, each supplement provides 350 kcal and 20 grams of protein.  -MVI with minerals daily  NUTRITION DIAGNOSIS:   Increased nutrient needs related to post-op healing as evidenced by estimated needs.  GOAL:   Patient will meet greater than or equal to 90% of their needs  MONITOR:   PO intake, Supplement acceptance, Diet advancement  REASON FOR ASSESSMENT:   Consult Assessment of nutrition requirement/status, Hip fracture protocol  ASSESSMENT:   Pt with medical history significant for T2DM, HTN who presented for evaluation of right knee pain after a fall.  Pt admitted with rt femur fracture.   Reviewed I/O's: 0 ml x 24 hours  Pt unavailable at time of visit. Attempted to speak with pt via call to hospital room phone, however, unable to reach. RD unable to obtain further nutrition-related history or complete nutrition-focused physical exam at this time.    Per orthopedics notes, plan for ORIF today. Pt currently NPO for procedure.   Per H&P, pt typically uses a cane to ambulate.   No wt loss noted over the past 3 months. Per nursing assessment, pt with mild edema, which may be masking true weight loss as well as fat and muscle depletions.   Pt with increased nutritional needs to support post-operative healing and would benefit from addition of oral nutrition supplements.   Medications reviewed and include lactated ringers infusion @ 10 ml/hr.   Lab Results  Component Value Date   HGBA1C 6.5 (H) 09/11/2023   PTA DM medications are 750 mg metformin BID.   Labs reviewed: CBGS: 92-135 (inpatient orders for glycemic control are 0-9 units insulin aspart TID with meals).    Diet Order:   Diet Order             Diet NPO time specified  Diet effective midnight                   EDUCATION NEEDS:   No education needs have been  identified at this time  Skin:  Skin Assessment: Reviewed RN Assessment  Last BM:  12/05/23  Height:   Ht Readings from Last 1 Encounters:  12/06/23 (P) 5\' 5"  (1.651 m)    Weight:   Wt Readings from Last 1 Encounters:  12/06/23 (P) 76.2 kg    Ideal Body Weight:  56.8 kg  BMI:  Body mass index is 27.96 kg/m (pended).  Estimated Nutritional Needs:   Kcal:  1500-1700  Protein:  75-90 grams  Fluid:  1.5-1.7 L    Levada Schilling, RD, LDN, CDCES Registered Dietitian III Certified Diabetes Care and Education Specialist If unable to reach this RD, please use "RD Inpatient" group chat on secure chat between hours of 8am-4 pm daily

## 2023-12-06 NOTE — Plan of Care (Signed)
   Problem: Activity: Goal: Risk for activity intolerance will decrease Outcome: Progressing   Problem: Nutrition: Goal: Adequate nutrition will be maintained Outcome: Progressing   Problem: Pain Managment: Goal: General experience of comfort will improve and/or be controlled Outcome: Progressing

## 2023-12-06 NOTE — Anesthesia Procedure Notes (Signed)
 Procedure Name: Intubation Date/Time: 12/06/2023 10:51 AM  Performed by: Darlina Guys, CRNAPre-anesthesia Checklist: Patient identified, Emergency Drugs available, Suction available and Patient being monitored Patient Re-evaluated:Patient Re-evaluated prior to induction Oxygen Delivery Method: Circle System Utilized Preoxygenation: Pre-oxygenation with 100% oxygen Induction Type: IV induction Ventilation: Mask ventilation without difficulty Laryngoscope Size: Mac and 3 Grade View: Grade I Tube type: Oral Tube size: 7.0 mm Number of attempts: 1 Airway Equipment and Method: Stylet and Oral airway Placement Confirmation: ETT inserted through vocal cords under direct vision, positive ETCO2 and breath sounds checked- equal and bilateral Secured at: 21 cm Tube secured with: Tape Dental Injury: Teeth and Oropharynx as per pre-operative assessment  Comments: Atraumatic induction/intubation. Dentition and oral mucosa as per preop

## 2023-12-06 NOTE — Progress Notes (Addendum)
 PROGRESS NOTE    Renee Watts  GNF:[id number] DOB: April 11, 1951 DOA: 12/05/2023 PCP: Deeann Saint, MD  Outpatient Specialists:     Brief Narrative:  As per H&P done on admission: "Renee Watts is a 73 y.o. female with medical history significant for T2DM, HTN who presented to the ED for evaluation of right knee pain after a fall.   Patient states she normally ambulates with use of a cane.  She was walking outside with her cane when her right knee buckled causing her to fall onto the ground.  She says she actually fell onto her left side but when she tried to move her right leg she felt her right knee bent inwards.  She had significant pain and could not get up.   She did not hit her head or lose consciousness.  She denies any recent chest pain, dyspnea, lightheadedness, dizziness.   ED Course  Labs/Imaging on admission: I have personally reviewed following labs and imaging studies.   Initial vitals showed BP 171/76, pulse 59, RR 15, temp 98.3 F, SpO2 99% on room air.   Labs showed sodium 138, potassium 3.5, bicarb 23, BUN 9, creatinine 0.44, serum glucose 109, LFTs within normal limits, WBC 7.0, hemoglobin 11.3, platelets 153,000.   Right knee x-ray showed mildly displaced and minimally angulated fracture of the distal femur.  Right knee arthroplasty with periprosthetic lucency about the femoral component and distal femoral metadiaphysis, suspicious for loosening also noted.   Patient was given 500 cc normal saline, 0.5 mg IV Dilaudid.  Orthopedics were consulted and recommended medical admission with plan for ORIF tomorrow with Dr. Jena Gauss.  The hospitalist service was consulted to admit".  12/06/2023: Patient seen.  Patient has undergone open reduction internal fixation of right supracondylar distal femur fracture.  Postop management as per orthopedic team.   Assessment & Plan:   Principal Problem:   Closed fracture of distal end of right femur, unspecified fracture  morphology, initial encounter (HCC) Active Problems:   Type II diabetes mellitus with neurological manifestations (HCC)   Hypertension associated with diabetes (HCC)   Normocytic anemia   Periprosthetic distal right femur fracture: -Fracture close following a fall. -Patient has undergone surgery. -Patient is stable postop. -Postop management as per orthopedic team.     Type 2 diabetes: Holding metformin.   Placed on SSI.   Hypertension: Continue lisinopril.   Normocytic anemia: Hemoglobin stable at 11.3.  Continue to monitor.   DVT prophylaxis: Subcutaneous Lovenox 40 Mg daily. Code Status: Full code. Family Communication:  Disposition Plan: Inpatient.   Consultants:  Orthopedic team.  Procedures:  open reduction internal fixation of right supracondylar distal femur fracture  Antimicrobials:  IV cefazolin.   Subjective: No new complaints.  Objective: Vitals:   12/06/23 1400 12/06/23 1415 12/06/23 1430 12/06/23 1458  BP: (!) 171/80 (!) 172/78 (!) 172/77 (!) 168/84  Pulse: 72 69 73 89  Resp: 14 12 17 14   Temp:   99 F (37.2 C) 98.1 F (36.7 C)  TempSrc:      SpO2: 95% 96% 99% 95%  Weight:      Height:        Intake/Output Summary (Last 24 hours) at 12/06/2023 1610 Last data filed at 12/06/2023 1053 Gross per 24 hour  Intake 100 ml  Output --  Net 100 ml   Filed Weights   12/05/23 1440 12/06/23 0856  Weight: 76.2 kg (P) 76.2 kg    Examination:  General exam: Appears calm and comfortable  Respiratory system: Clear to auscultation.  Cardiovascular system: S1 & S2 heard. Gastrointestinal system: Abdomen is soft and non-tender Central nervous system: Awake and alert.  Moves all extremities. Extremities: Right lower extremity is wrapped.  Data Reviewed: I have personally reviewed following labs and imaging studies  CBC: Recent Labs  Lab 12/05/23 1710 12/06/23 0417  WBC 7.0 5.5  NEUTROABS 5.2  --   HGB 11.3* 10.4*  HCT 33.5* 30.1*  MCV  89.6 89.1  PLT 153 143*   Basic Metabolic Panel: Recent Labs  Lab 12/05/23 1710 12/06/23 0417  NA 138 142  K 3.5 3.7  CL 105 107  CO2 23 25  GLUCOSE 109* 119*  BUN 9 6*  CREATININE 0.44 0.44  CALCIUM 9.3 9.1   GFR: Estimated Creatinine Clearance: 64 mL/min (by C-G formula based on SCr of 0.44 mg/dL). Liver Function Tests: Recent Labs  Lab 12/05/23 1710  AST 35  ALT 28  ALKPHOS 49  BILITOT 0.8  PROT 7.5  ALBUMIN 3.7   No results for input(s): "LIPASE", "AMYLASE" in the last 168 hours. No results for input(s): "AMMONIA" in the last 168 hours. Coagulation Profile: No results for input(s): "INR", "PROTIME" in the last 168 hours. Cardiac Enzymes: No results for input(s): "CKTOTAL", "CKMB", "CKMBINDEX", "TROPONINI" in the last 168 hours. BNP (last 3 results) No results for input(s): "PROBNP" in the last 8760 hours. HbA1C: No results for input(s): "HGBA1C" in the last 72 hours. CBG: Recent Labs  Lab 12/05/23 2153 12/06/23 0659 12/06/23 0904 12/06/23 1211  GLUCAP 135* 110* 92 135*   Lipid Profile: No results for input(s): "CHOL", "HDL", "LDLCALC", "TRIG", "CHOLHDL", "LDLDIRECT" in the last 72 hours. Thyroid Function Tests: No results for input(s): "TSH", "T4TOTAL", "FREET4", "T3FREE", "THYROIDAB" in the last 72 hours. Anemia Panel: No results for input(s): "VITAMINB12", "FOLATE", "FERRITIN", "TIBC", "IRON", "RETICCTPCT" in the last 72 hours. Urine analysis:    Component Value Date/Time   COLORURINE YELLOW 01/05/2013 0900   APPEARANCEUR CLOUDY (A) 01/05/2013 0900   LABSPEC 1.019 01/05/2013 0900   PHURINE 7.0 01/05/2013 0900   GLUCOSEU NEGATIVE 01/05/2013 0900   GLUCOSEU NEG mg/dL 16/05/9603 5409   HGBUR NEGATIVE 01/05/2013 0900   BILIRUBINUR neg 03/25/2023 1152   KETONESUR NEGATIVE 01/05/2013 0900   PROTEINUR Negative 03/25/2023 1152   PROTEINUR NEGATIVE 01/05/2013 0900   UROBILINOGEN negative (A) 03/25/2023 1152   UROBILINOGEN 1.0 01/05/2013 0900    NITRITE neg 03/25/2023 1152   NITRITE NEGATIVE 01/05/2013 0900   LEUKOCYTESUR Negative 03/25/2023 1152   Sepsis Labs: @LABRCNTIP (procalcitonin:4,lacticidven:4)  ) Recent Results (from the past 240 hours)  Surgical pcr screen     Status: None   Collection Time: 12/06/23  3:23 AM   Specimen: Nasal Mucosa; Nasal Swab  Result Value Ref Range Status   MRSA, PCR NEGATIVE NEGATIVE Final   Staphylococcus aureus NEGATIVE NEGATIVE Final    Comment: (NOTE) The Xpert SA Assay (FDA approved for NASAL specimens in patients 47 years of age and older), is one component of a comprehensive surveillance program. It is not intended to diagnose infection nor to guide or monitor treatment. Performed at [facility], [address] [place], [place] [address]          Radiology Studies: DG FEMUR, [place] 2 VIEWS RIGHT Result Date: 12/06/2023 CLINICAL DATA:  Elective surgery.  ORIF right femur. EXAM: RIGHT FEMUR 2 VIEWS COMPARISON:  Right hip and knee radiographs 12/05/2023 FINDINGS: Images were performed intraoperatively without the presence of a radiologist. Redemonstration of periprosthetic  fracture proximal to the femoral component of total right knee arthroplasty. The patient is undergoing long lateral plate and screw fixation of the fracture. No hardware complication is seen. Total fluoroscopy images: 6 Total fluoroscopy time: 39 seconds Total dose: Radiation Exposure Index (as provided by the fluoroscopic device): 2.73 mGy air Kerma Please see intraoperative findings for further detail. IMPRESSION: Intraoperative fluoroscopy for long lateral plate and screw fixation of periprosthetic fracture proximal to the femoral component of total right knee arthroplasty. Electronically Signed   By: Neita Garnet M.D.   On: 12/06/2023 12:18   DG C-Arm 1-60 Min-No Report Result Date: 12/06/2023 Fluoroscopy was utilized by the requesting physician.  No radiographic interpretation.   DG HIP UNILAT WITH PELVIS  2-3 VIEWS RIGHT Result Date: 12/06/2023 CLINICAL DATA:  [id number] Femur fracture, right (HCC) [id number] EXAM: DG HIP (WITH OR WITHOUT PELVIS) 2-3V RIGHT COMPARISON:  None Available. FINDINGS: Total right hip arthroplasty. No radiographic evidence of surgical hardware complication. There is no evidence of hip fracture or dislocation of the right hip. No acute displaced fracture or dislocation of the left hip. No acute displaced fracture or diastasis of the bones of the pelvis. There is no evidence of severe arthropathy or other focal bone abnormality. IMPRESSION: Negative for acute traumatic injury. Electronically Signed   By: Tish Frederickson M.D.   On: 12/06/2023 00:04   DG Knee Complete 4 Views Right Result Date: 12/05/2023 CLINICAL DATA:  Pain after fall. EXAM: RIGHT KNEE - COMPLETE 4+ VIEW COMPARISON:  Radiograph 06/25/2023 FINDINGS: Right knee arthroplasty. Periprosthetic lucency about the femoral component and distal femoral metadiaphysis. Fracture is mildly displaced and minimally angulated. No convincing knee joint effusion or lipohemarthrosis. Patellar baja was present on preoperative exam. Mild soft tissue edema. IMPRESSION: 1. Right knee arthroplasty with periprosthetic lucency about the femoral component and distal femoral metadiaphysis, suspicious for loosening. 2. Mildly displaced and minimally angulated fracture of the distal femur. Electronically Signed   By: Narda Rutherford M.D.   On: 12/05/2023 16:50        Scheduled Meds:  docusate sodium  100 mg Oral BID   droperidol       [START ON 12/07/2023] enoxaparin (LOVENOX) injection  40 mg Subcutaneous Q24H   [START ON 12/07/2023] feeding supplement  237 mL Oral BID BM   fentaNYL       fentaNYL       HYDROmorphone       HYDROmorphone       HYDROmorphone       HYDROmorphone       insulin aspart  0-9 Units Subcutaneous TID WC   ketorolac       lisinopril  20 mg Oral Q breakfast   [START ON 12/07/2023] multivitamin with minerals  1 tablet  Oral Daily   oxyCODONE       Continuous Infusions:  sodium chloride 40 mL/hr at 12/06/23 1515   acetaminophen      ceFAZolin (ANCEF) IV       LOS: 1 day    Time spent: 35 minutes.    Berton Mount, MD  [facility] Pager #: [phone] 7PM-7AM contact night coverage as above

## 2023-12-07 DIAGNOSIS — S72401A Unspecified fracture of lower end of right femur, initial encounter for closed fracture: Secondary | ICD-10-CM | POA: Diagnosis not present

## 2023-12-07 LAB — BASIC METABOLIC PANEL WITH GFR
Anion gap: 10 (ref 5–15)
BUN: 6 mg/dL — ABNORMAL LOW (ref 8–23)
CO2: 26 mmol/L (ref 22–32)
Calcium: 8.9 mg/dL (ref 8.9–10.3)
Chloride: 104 mmol/L (ref 98–111)
Creatinine, Ser: 0.43 mg/dL — ABNORMAL LOW (ref 0.44–1.00)
GFR, Estimated: >60 mL/min (ref >60)
Glucose, Bld: 132 mg/dL — ABNORMAL HIGH (ref 70–99)
Potassium: 3.3 mmol/L — ABNORMAL LOW (ref 3.5–5.1)
Sodium: 140 mmol/L (ref 135–145)

## 2023-12-07 LAB — GLUCOSE, CAPILLARY
Glucose-Capillary: 119 mg/dL — ABNORMAL HIGH (ref 70–99)
Glucose-Capillary: 179 mg/dL — ABNORMAL HIGH (ref 70–99)
Glucose-Capillary: 158 mg/dL — ABNORMAL HIGH (ref 70–99)
Glucose-Capillary: 150 mg/dL — ABNORMAL HIGH (ref 70–99)

## 2023-12-07 LAB — CBC
HCT: 28.2 % — ABNORMAL LOW (ref 36.0–46.0)
Hemoglobin: 9.7 g/dL — ABNORMAL LOW (ref 12.0–15.0)
MCH: 30.2 pg (ref 26.0–34.0)
MCHC: 34.4 g/dL (ref 30.0–36.0)
MCV: 87.9 fL (ref 80.0–100.0)
Platelets: 146 10*3/uL — ABNORMAL LOW (ref 150–400)
RBC: 3.21 MIL/uL — ABNORMAL LOW (ref 3.87–5.11)
RDW: 13.4 % (ref 11.5–15.5)
WBC: 7.2 10*3/uL (ref 4.0–10.5)
nRBC: 0 % (ref 0.0–0.2)

## 2023-12-07 LAB — VITAMIN D 25 HYDROXY (VIT D DEFICIENCY, FRACTURES): Vit D, 25-Hydroxy: 21.99 ng/mL — ABNORMAL LOW (ref 30–100)

## 2023-12-07 MED ORDER — ALUM & MAG HYDROXIDE-SIMETH 200-200-20 MG/5ML PO SUSP
30.0000 mL | Freq: Four times a day (QID) | ORAL | Status: DC | PRN
  Administered 2023-12-07 – 2023-12-08 (×2): 30 mL via ORAL
  Filled 2023-12-07 (×3): qty 30

## 2023-12-07 MED ORDER — POTASSIUM CHLORIDE 20 MEQ PO PACK
40.0000 meq | PACK | ORAL | Status: AC
  Administered 2023-12-07 (×2): 40 meq via ORAL
  Filled 2023-12-07 (×2): qty 2

## 2023-12-07 NOTE — Progress Notes (Signed)
 PROGRESS NOTE    Renee Watts  WUJ:[id number] DOB: [birth year] DOA: 12/05/2023 PCP: Viola Greulich, MD  Outpatient Specialists:     Brief Narrative:  As per H&P done on admission: "Renee Watts is a 73 y.o. female with medical history significant for T2DM, HTN who presented to the ED for evaluation of right knee pain after a fall.   Patient states she normally ambulates with use of a cane.  She was walking outside with her cane when her right knee buckled causing her to fall onto the ground.  She says she actually fell onto her left side but when she tried to move her right leg she felt her right knee bent inwards.  She had significant pain and could not get up.   She did not hit her head or lose consciousness.  She denies any recent chest pain, dyspnea, lightheadedness, dizziness.   ED Course  Labs/Imaging on admission: I have personally reviewed following labs and imaging studies.   Initial vitals showed BP 171/76, pulse 59, RR 15, temp 98.3 F, SpO2 99% on room air.   Labs showed sodium 138, potassium 3.5, bicarb 23, BUN 9, creatinine 0.44, serum glucose 109, LFTs within normal limits, WBC 7.0, hemoglobin 11.3, platelets 153,000.   Right knee x-ray showed mildly displaced and minimally angulated fracture of the distal femur.  Right knee arthroplasty with periprosthetic lucency about the femoral component and distal femoral metadiaphysis, suspicious for loosening also noted.   Patient was given 500 cc normal saline, 0.5 mg IV Dilaudid.  Orthopedics were consulted and recommended medical admission with plan for ORIF tomorrow with Dr. Curtiss Dowdy.  The hospitalist service was consulted to admit".  12/06/2023: Patient seen.  Patient has undergone open reduction internal fixation of right supracondylar distal femur fracture.  Postop management as per orthopedic team. 12/07/2023: Patient seen.  No new complaints.  Pain is reasonably controlled.  Postop care as directed by orthopedic  team.   Assessment & Plan:   Principal Problem:   Closed fracture of distal end of right femur, unspecified fracture morphology, initial encounter (HCC) Active Problems:   Type II diabetes mellitus with neurological manifestations (HCC)   Hypertension associated with diabetes (HCC)   Normocytic anemia   Periprosthetic distal right femur fracture: -Fracture close following a fall. -Patient has undergone surgery. -Patient is stable postop. -Postop management as per orthopedic team.     Type 2 diabetes: Holding metformin.   Placed on SSI.   Hypertension: Continue lisinopril.   Normocytic anemia: Hemoglobin stable at 11.3.  Continue to monitor.   DVT prophylaxis: Subcutaneous Lovenox 40 Mg daily. Code Status: Full code. Family Communication:  Disposition Plan: Inpatient.   Consultants:  Orthopedic team.  Procedures:  open reduction internal fixation of right supracondylar distal femur fracture  Antimicrobials:  IV cefazolin discontinued.   Subjective: No new complaints.  Objective: Vitals:   12/06/23 2105 12/07/23 0622 12/07/23 0816 12/07/23 1642  BP: (!) 140/69 (!) 153/77 (!) 128/53 (!) 145/56  Pulse: 62 (!) 102 (!) 106 (!) 104  Resp: 17 17 16 16   Temp: 97.9 F (36.6 C)  99 F (37.2 C) 98.5 F (36.9 C)  TempSrc: Oral Oral Oral Oral  SpO2: 100% 98% 95% 100%  Weight:      Height:        Intake/Output Summary (Last 24 hours) at 12/07/2023 1805 Last data filed at 12/07/2023 1643 Gross per 24 hour  Intake --  Output 600 ml  Net -600 ml  Filed Weights   12/05/23 1440 12/06/23 0856  Weight: 76.2 kg (P) 76.2 kg    Examination:  General exam: Appears calm and comfortable  Respiratory system: Clear to auscultation.  Cardiovascular system: S1 & S2 heard. Gastrointestinal system: Abdomen is soft and non-tender Central nervous system: Awake and alert.  Moves all extremities. Extremities: Right lower extremity is wrapped.  Data Reviewed: I have  personally reviewed following labs and imaging studies  CBC: Recent Labs  Lab 12/05/23 1710 12/06/23 0417 12/06/23 1648 12/07/23 0429  WBC 7.0 5.5 8.6 7.2  NEUTROABS 5.2  --   --   --   HGB 11.3* 10.4* 11.0* 9.7*  HCT 33.5* 30.1* 31.6* 28.2*  MCV 89.6 89.1 88.5 87.9  PLT 153 143* 150 146*   Basic Metabolic Panel: Recent Labs  Lab 12/05/23 1710 12/06/23 0417 12/06/23 1648 12/07/23 0429  NA 138 142  --  140  K 3.5 3.7  --  3.3*  CL 105 107  --  104  CO2 23 25  --  26  GLUCOSE 109* 119*  --  132*  BUN 9 6*  --  6*  CREATININE 0.44 0.44 0.42* 0.43*  CALCIUM 9.3 9.1  --  8.9   GFR: Estimated Creatinine Clearance: 64 mL/min (A) (by C-G formula based on SCr of 0.43 mg/dL (L)). Liver Function Tests: Recent Labs  Lab 12/05/23 1710  AST 35  ALT 28  ALKPHOS 49  BILITOT 0.8  PROT 7.5  ALBUMIN 3.7   No results for input(s): "LIPASE", "AMYLASE" in the last 168 hours. No results for input(s): "AMMONIA" in the last 168 hours. Coagulation Profile: No results for input(s): "INR", "PROTIME" in the last 168 hours. Cardiac Enzymes: No results for input(s): "CKTOTAL", "CKMB", "CKMBINDEX", "TROPONINI" in the last 168 hours. BNP (last 3 results) No results for input(s): "PROBNP" in the last 8760 hours. HbA1C: No results for input(s): "HGBA1C" in the last 72 hours. CBG: Recent Labs  Lab 12/06/23 1211 12/06/23 1644 12/07/23 0620 12/07/23 1135 12/07/23 1626  GLUCAP 135* 171* 119* 179* 158*   Lipid Profile: No results for input(s): "CHOL", "HDL", "LDLCALC", "TRIG", "CHOLHDL", "LDLDIRECT" in the last 72 hours. Thyroid Function Tests: No results for input(s): "TSH", "T4TOTAL", "FREET4", "T3FREE", "THYROIDAB" in the last 72 hours. Anemia Panel: No results for input(s): "VITAMINB12", "FOLATE", "FERRITIN", "TIBC", "IRON", "RETICCTPCT" in the last 72 hours. Urine analysis:    Component Value Date/Time   COLORURINE YELLOW 01/05/2013 0900   APPEARANCEUR CLOUDY (A) 01/05/2013  0900   LABSPEC 1.019 01/05/2013 0900   PHURINE 7.0 01/05/2013 0900   GLUCOSEU NEGATIVE 01/05/2013 0900   GLUCOSEU NEG mg/dL 81/19/1478 2956   HGBUR NEGATIVE 01/05/2013 0900   BILIRUBINUR neg 03/25/2023 1152   KETONESUR NEGATIVE 01/05/2013 0900   PROTEINUR Negative 03/25/2023 1152   PROTEINUR NEGATIVE 01/05/2013 0900   UROBILINOGEN negative (A) 03/25/2023 1152   UROBILINOGEN 1.0 01/05/2013 0900   NITRITE neg 03/25/2023 1152   NITRITE NEGATIVE 01/05/2013 0900   LEUKOCYTESUR Negative 03/25/2023 1152   Sepsis Labs: @LABRCNTIP (procalcitonin:4,lacticidven:4)  ) Recent Results (from the past 240 hours)  Surgical pcr screen     Status: None   Collection Time: 12/06/23  3:23 AM   Specimen: Nasal Mucosa; Nasal Swab  Result Value Ref Range Status   MRSA, PCR NEGATIVE NEGATIVE Final   Staphylococcus aureus NEGATIVE NEGATIVE Final    Comment: (NOTE) The Xpert SA Assay (FDA approved for NASAL specimens in patients 11 years of age and older), is one component of  a comprehensive surveillance program. It is not intended to diagnose infection nor to guide or monitor treatment. Performed at [facility], [address] [place], [place] [address]          Radiology Studies: DG Knee Right Port Result Date: 12/06/2023 CLINICAL DATA:  Fracture, postop EXAM: PORTABLE RIGHT KNEE - 1-2 VIEW COMPARISON:  None Available. FINDINGS: Prior right hip and knee replacement. Lateral plate and screw fixation device extends from the proximal femoral shaft to the distal femoral condyle. No hardware complicating feature. Probable distal femoral fracture again noted as seen on prior knee series. Anatomic alignment. IMPRESSION: Plate and screw fixation across the distal femoral fracture with anatomic alignment. Electronically Signed   By: Janeece Mechanic M.D.   On: 12/06/2023 19:40   DG FEMUR, MIN 2 VIEWS RIGHT Result Date: 12/06/2023 CLINICAL DATA:  Elective surgery.  ORIF right femur. EXAM: RIGHT FEMUR 2  VIEWS COMPARISON:  Right hip and knee radiographs 12/05/2023 FINDINGS: Images were performed intraoperatively without the presence of a radiologist. Redemonstration of periprosthetic fracture proximal to the femoral component of total right knee arthroplasty. The patient is undergoing long lateral plate and screw fixation of the fracture. No hardware complication is seen. Total fluoroscopy images: 6 Total fluoroscopy time: 39 seconds Total dose: Radiation Exposure Index (as provided by the fluoroscopic device): 2.73 mGy air Kerma Please see intraoperative findings for further detail. IMPRESSION: Intraoperative fluoroscopy for long lateral plate and screw fixation of periprosthetic fracture proximal to the femoral component of total right knee arthroplasty. Electronically Signed   By: Bertina Broccoli M.D.   On: 12/06/2023 12:18   DG C-Arm 1-60 Min-No Report Result Date: 12/06/2023 Fluoroscopy was utilized by the requesting physician.  No radiographic interpretation.   DG HIP UNILAT WITH PELVIS 2-3 VIEWS RIGHT Result Date: 12/06/2023 CLINICAL DATA:  [id number] Femur fracture, right (HCC) [id number] EXAM: DG HIP (WITH OR WITHOUT PELVIS) 2-3V RIGHT COMPARISON:  None Available. FINDINGS: Total right hip arthroplasty. No radiographic evidence of surgical hardware complication. There is no evidence of hip fracture or dislocation of the right hip. No acute displaced fracture or dislocation of the left hip. No acute displaced fracture or diastasis of the bones of the pelvis. There is no evidence of severe arthropathy or other focal bone abnormality. IMPRESSION: Negative for acute traumatic injury. Electronically Signed   By: Morgane  Naveau M.D.   On: 12/06/2023 00:04        Scheduled Meds:  docusate sodium  100 mg Oral BID   enoxaparin (LOVENOX) injection  40 mg Subcutaneous Q24H   feeding supplement  237 mL Oral BID BM   insulin aspart  0-9 Units Subcutaneous TID WC   lisinopril  20 mg Oral Q breakfast    multivitamin with minerals  1 tablet Oral Daily   Continuous Infusions:  sodium chloride Stopped (12/06/23 1645)     LOS: 2 days    Time spent: 35 minutes.    Fonnie Iba, MD  [facility] Pager #: [phone] 7PM-7AM contact night coverage as above

## 2023-12-07 NOTE — Plan of Care (Signed)

## 2023-12-07 NOTE — Progress Notes (Signed)
   ORTHOPAEDIC PROGRESS NOTE  s/p Procedure(s): OPEN REDUCTION INTERNAL FIXATION (ORIF) DISTAL FEMUR FRACTURE  SUBJECTIVE: Reports mild to moderate pain about operative site.   No chest pain. No SOB. No nausea/vomiting. No other complaints.  OBJECTIVE: PE: General: sitting in hospital bed, NAD RLE: dressing CDI, warm well perfused foot, intact EHL/TA/GSC   Vitals:   12/07/23 0622 12/07/23 0816  BP: (!) 153/77 (!) 128/53  Pulse: (!) 102 (!) 106  Resp: 17 16  Temp:  99 F (37.2 C)  SpO2: 98% 95%     ASSESSMENT: Renee Watts is a 73 y.o. female POD#1  PLAN: Weightbearing: WBAT RLE Insicional and dressing care: Reinforce dressings as needed Orthopedic device(s): None Showering: Hold for now VTE prophylaxis: Lovenox Pain control: PRN pain medications, minimize narcotics as able Follow - up plan: 2 weekes in office with Dr. Curtiss Dowdy Dispo: TBD. PT/OT evals today.  Contact information: After hours and holidays please check [email or web address] for group call information for [facility]   Nicholas Bari, PA-C 12/07/23

## 2023-12-07 NOTE — Evaluation (Signed)
 Physical Therapy Evaluation Patient Details Name: Renee Watts MRN: [id number] DOB: 05-08-1951 Today's Date: 12/07/2023  History of Present Illness  Pt is a 73 y/o female presenting on 12/05/23 with right knee pain s/p mechanical fall. Admitted with closed fracture of distal end of right femur. S/p R ORIF of right distal femur on 12/06/23. PMH DM2, HTN, depression/anxiety, R THA, Bil TKA, bladder surgery, GERD, recent back surgery.  Clinical Impression  Pt is a 73 y.o. female presenting on 12/05/23 with above conditions and deficits below, see PT Problem List. PTA pt lived mod I (with intermittent RW use in the home and assistance for iADLs) in a one story home with her husband and granddaughter, and had 2 STE. Currently she is mod A for R LE management in bed mobility, min A for transfers, and min A-CGA for ambulation with a RW. Pt displays deficits in functional mobility, transfers, gait, balance, activity tolerance, LE strength and ROM, and pain and would benefit from continued acute PT. Given the pt's motivation, family support, home set-up, and PLOF she would benefit from a HHPT follow up upon d/c to promote a return to functional independence. Pt would benefit from continued mobility with nurses and mobility specialists until d/c. Will continue to follow acutely.           If plan is discharge home, recommend the following: A little help with walking and/or transfers;A little help with bathing/dressing/bathroom;Assistance with cooking/housework;Assist for transportation;Help with stairs or ramp for entrance   Can travel by private vehicle        Equipment Recommendations BSC/3in1  Recommendations for Other Services       Functional Status Assessment Patient has had a recent decline in their functional status and demonstrates the ability to make significant improvements in function in a reasonable and predictable amount of time.     Precautions / Restrictions  Precautions Precautions: Fall Recall of Precautions/Restrictions: Intact Restrictions Weight Bearing Restrictions Per Provider Order: Yes RLE Weight Bearing Per Provider Order: Weight bearing as tolerated      Mobility  Bed Mobility Overal bed mobility: Needs Assistance Bed Mobility: Supine to Sit     Supine to sit: Mod assist     General bed mobility comments: pt required mod A for R LE management when moving to the L side of the bed.    Transfers Overall transfer level: Needs assistance Equipment used: Rolling walker (2 wheels) Transfers: Sit to/from Stand Sit to Stand: Min assist           General transfer comment: pt required min A to power up from EOB and for hand placement on the seated surface. Stands with R LE in more extension    Ambulation/Gait Ambulation/Gait assistance: Min assist, Contact guard assist Gait Distance (Feet): 45 Feet Assistive device: Rolling walker (2 wheels) Gait Pattern/deviations: Step-to pattern, Decreased stride length, Knee flexed in stance - right, Shuffle, Trunk flexed Gait velocity: reduced Gait velocity interpretation: <1.31 ft/sec, indicative of household ambulator   General Gait Details: pt walked with slow step-to-pattern and had increased R knee flexion throughout the gait cycle. Cueing for placing additional weight through the RW during R stance was provided to which the pt slightly increased R knee extension  Stairs            Wheelchair Mobility     Tilt Bed    Modified Rankin (Stroke Patients Only)       Balance Overall balance assessment: Needs assistance Sitting-balance support: No upper extremity  supported, Feet supported, Bilateral upper extremity supported Sitting balance-Leahy Scale: Good Sitting balance - Comments: pt sits EOB with R LE further extended than L. No LOB and slight posterior lean Postural control: Posterior lean Standing balance support: Bilateral upper extremity supported, Reliant on  assistive device for balance, During functional activity Standing balance-Leahy Scale: Poor Standing balance comment: pt reliant on RW for standing balance and has increased R knee flexion. No LOB                             Pertinent Vitals/Pain Pain Assessment Pain Assessment: Faces Faces Pain Scale: Hurts little more Pain Location: R LE Pain Descriptors / Indicators: Discomfort, Grimacing, Guarding, Sharp Pain Intervention(s): Monitored during session, Premedicated before session    Home Living Family/patient expects to be discharged to:: Private residence Living Arrangements: Spouse/significant other;Children Available Help at Discharge: Family;Available 24 hours/day Type of Home: House Home Access: Stairs to enter Entrance Stairs-Rails: Right Entrance Stairs-Number of Steps: 2   Home Layout: One level Home Equipment: Grab bars - tub/shower;Rolling Walker (2 wheels);Cane - single point;Adaptive equipment Additional Comments: husband retired and pt retired, pt's 2 daughters working but available to assist    Prior Function Prior Level of Function : Independent/Modified Independent             Mobility Comments: using RW in home at times, otherwise furniture walking ADLs Comments: indepednent ADLs, light IADLs but family assists with IADLs     Extremity/Trunk Assessment   Upper Extremity Assessment Upper Extremity Assessment: Defer to OT evaluation    Lower Extremity Assessment Lower Extremity Assessment: RLE deficits/detail;Generalized weakness RLE Deficits / Details: pt lacked full AROM, particularly knee flexion as it was painful to flex her knee RLE Coordination: decreased gross motor    Cervical / Trunk Assessment Cervical / Trunk Assessment: Normal  Communication   Communication Communication: No apparent difficulties    Cognition Arousal: Alert Behavior During Therapy: WFL for tasks assessed/performed   PT - Cognitive impairments: No  apparent impairments                         Following commands: Intact       Cueing Cueing Techniques: Verbal cues     General Comments      Exercises     Assessment/Plan    PT Assessment Patient needs continued PT services  PT Problem List Decreased strength;Decreased range of motion;Decreased activity tolerance;Decreased balance;Decreased mobility;Decreased coordination;Decreased knowledge of use of DME;Pain       PT Treatment Interventions Gait training;Stair training;DME instruction;Functional mobility training;Therapeutic activities;Therapeutic exercise;Balance training;Neuromuscular re-education;Patient/family education;Wheelchair mobility training    PT Goals (Current goals can be found in the Care Plan section)  Acute Rehab PT Goals Patient Stated Goal: improve mobility PT Goal Formulation: With patient Time For Goal Achievement: 12/21/23 Potential to Achieve Goals: Good    Frequency Min 2X/week     Co-evaluation               AM-PAC PT "6 Clicks" Mobility  Outcome Measure Help needed turning from your back to your side while in a flat bed without using bedrails?: A Little Help needed moving from lying on your back to sitting on the side of a flat bed without using bedrails?: A Lot Help needed moving to and from a bed to a chair (including a wheelchair)?: A Little Help needed standing up from a chair using your arms (  e.g., wheelchair or bedside chair)?: A Little Help needed to walk in hospital room?: A Little Help needed climbing 3-5 steps with a railing? : A Lot 6 Click Score: 16    End of Session Equipment Utilized During Treatment: Gait belt Activity Tolerance: Patient tolerated treatment well Patient left: in chair;with call bell/phone within reach;with chair alarm set Nurse Communication: Mobility status PT Visit Diagnosis: Other abnormalities of gait and mobility (R26.89);Muscle weakness (generalized) (M62.81);Difficulty in walking,  not elsewhere classified (R26.2);Pain Pain - Right/Left: Right Pain - part of body: Leg    Time: 1100-1129 PT Time Calculation (min) (ACUTE ONLY): 29 min   Charges:   PT Evaluation $PT Eval Moderate Complexity: 1 Mod PT Treatments $Gait Training: 8-22 mins PT General Charges $$ ACUTE PT VISIT: 1 Visit         [address], SPT   Gregory 12/07/2023, 2:07 PM

## 2023-12-07 NOTE — Evaluation (Addendum)
 Occupational Therapy Evaluation Patient Details Name: Renee Watts MRN: [id number] DOB: Feb 03, 1951 Today's Date: 12/07/2023   History of Present Illness   Pt is a 73 y/o female presenting on 12/05/23 with right knee pain s/p mechanical fall. Admitted with closed fracture of distal end of right femur. S/p R ORIF of right distal femur on 12/06/23. PMH DM2, HTN, depression/anxiety, R THA, Bil TKA, bladder surgery, GERD, recent back surgery.     Clinical Impressions PTA patient independent with ADLs, using RW vs furniture walking for mobility.  Admitted for above and presents with problem list below. Pt requires up to mod assist for LB ADLs, min assist for transfers and functional mobility in room.  Limited by pain in R LE, impaired balance and decreased activity tolerance. Based on performance today, pt will best benefit from continued OT services acutely and after dc at [facility] level to optimize independence and safety with ADLs, IADLs and mobility.      If plan is discharge home, recommend the following:   A little help with walking and/or transfers;A lot of help with bathing/dressing/bathroom;Assistance with cooking/housework;Assist for transportation;Help with stairs or ramp for entrance     Functional Status Assessment   Patient has had a recent decline in their functional status and demonstrates the ability to make significant improvements in function in a reasonable and predictable amount of time.     Equipment Recommendations   BSC/3in1     Recommendations for Other Services         Precautions/Restrictions   Precautions Precautions: Fall;Back Precaution Booklet Issued: No Recall of Precautions/Restrictions: Intact Precaution/Restrictions Comments: back surgery 09/17/23 Restrictions Weight Bearing Restrictions Per Provider Order: Yes RLE Weight Bearing Per Provider Order: Weight bearing as tolerated     Mobility Bed Mobility               General bed  mobility comments: OOB in recliner    Transfers Overall transfer level: Needs assistance Equipment used: Rolling walker (2 wheels) Transfers: Sit to/from Stand Sit to Stand: Min assist           General transfer comment: min assist from recliner and BSC, increased time and cueing for technique      Balance Overall balance assessment: Needs assistance Sitting-balance support: No upper extremity supported, Feet supported, Bilateral upper extremity supported Sitting balance-Leahy Scale: Good     Standing balance support: Bilateral upper extremity supported, Reliant on assistive device for balance, During functional activity Standing balance-Leahy Scale: Poor Standing balance comment: pt reliant on RW for standing                           ADL either performed or assessed with clinical judgement   ADL Overall ADL's : Needs assistance/impaired     Grooming: Set up;Sitting           Upper Body Dressing : Set up;Sitting   Lower Body Dressing: Moderate assistance;Sit to/from stand   Toilet Transfer: Minimal assistance;Ambulation;Rolling walker (2 wheels);BSC/3in1;Regular Teacher, adult education Details (indicate cue type and reason): 3:1 over toilet Toileting- Clothing Manipulation and Hygiene: Minimal assistance;Sit to/from stand       Functional mobility during ADLs: Minimal assistance;Rolling walker (2 wheels);Cueing for safety       Vision   Vision Assessment?: No apparent visual deficits     Perception         Praxis         Pertinent Vitals/Pain Pain Assessment Pain Assessment: Faces  Faces Pain Scale: Hurts even more Pain Location: R LE Pain Descriptors / Indicators: Discomfort, Grimacing, Guarding, Sharp Pain Intervention(s): Limited activity within patient's tolerance, Monitored during session, Repositioned, Premedicated before session, Ice applied     Extremity/Trunk Assessment Upper Extremity Assessment Upper Extremity Assessment:  Generalized weakness   Lower Extremity Assessment Lower Extremity Assessment: Defer to PT evaluation RLE Deficits / Details: pt lacked full AROM, particularly knee flexion as it was painful to flex her knee RLE Coordination: decreased gross motor   Cervical / Trunk Assessment Cervical / Trunk Assessment: Normal   Communication Communication Communication: No apparent difficulties   Cognition Arousal: Alert Behavior During Therapy: WFL for tasks assessed/performed Cognition: No apparent impairments                               Following commands: Intact       Cueing  General Comments   Cueing Techniques: Verbal cues      Exercises     Shoulder Instructions      Home Living Family/patient expects to be discharged to:: Private residence Living Arrangements: Spouse/significant other;Children Available Help at Discharge: Family;Available 24 hours/day Type of Home: House Home Access: Stairs to enter [employer] of Steps: 2 Entrance Stairs-Rails: Right Home Layout: One level     Bathroom Shower/Tub: Producer, television/film/video: Handicapped height     Home Equipment: Grab bars - tub/shower;Rolling Environmental consultant (2 wheels);Cane - single point;Adaptive equipment Adaptive Equipment: Reacher Additional Comments: husband retired and pt retired, pt's 2 daughters working but available to assist      Prior Functioning/Environment Prior Level of Function : Independent/Modified Independent             Mobility Comments: using RW in home at times, otherwise furniture walking ADLs Comments: indepednent ADLs, light IADLs but family assists with IADLs    OT Problem List: Decreased strength;Decreased activity tolerance;Impaired balance (sitting and/or standing);Decreased knowledge of use of DME or AE;Decreased knowledge of precautions;Pain   OT Treatment/Interventions: Self-care/ADL training;Therapeutic exercise;DME and/or AE instruction;Therapeutic  activities;Patient/family education;Balance training;Energy conservation      OT Goals(Current goals can be found in the care plan section)   Acute Rehab OT Goals Patient Stated Goal: get better OT Goal Formulation: With patient Time For Goal Achievement: 12/21/23 Potential to Achieve Goals: Good ADL Goals Pt Will Perform Grooming: with modified independence;standing Pt Will Perform Lower Body Dressing: with modified independence;sit to/from stand;with adaptive equipment Pt Will Transfer to Toilet: with modified independence;ambulating Pt Will Perform Toileting - Clothing Manipulation and hygiene: with modified independence;sit to/from stand Pt Will Perform Tub/Shower Transfer: with modified independence;ambulating;rolling walker;Shower transfer;3 in 1   OT Frequency:  Min 2X/week    Co-evaluation              AM-PAC OT "6 Clicks" Daily Activity     Outcome Measure Help from another person eating meals?: None Help from another person taking care of personal grooming?: A Little Help from another person toileting, which includes using toliet, bedpan, or urinal?: A Little Help from another person bathing (including washing, rinsing, drying)?: A Lot Help from another person to put on and taking off regular upper body clothing?: A Little Help from another person to put on and taking off regular lower body clothing?: A Lot 6 Click Score: 17   End of Session Equipment Utilized During Treatment: Gait belt;Rolling walker (2 wheels) Nurse Communication: Mobility status;Precautions  Activity Tolerance: Patient tolerated  treatment well Patient left: in chair;with call bell/phone within reach;with chair alarm set;with family/visitor present  OT Visit Diagnosis: Other abnormalities of gait and mobility (R26.89);Muscle weakness (generalized) (M62.81);Pain;History of falling (Z91.81) Pain - Right/Left: Right Pain - part of body: Leg;Knee                Time: 4098-1191 OT Time  Calculation (min): 30 min Charges:  OT General Charges $OT Visit: 1 Visit OT Evaluation $OT Eval Moderate Complexity: 1 Mod OT Treatments $Self Care/Home Management : 8-22 mins  Bary Boss, OT Acute Rehabilitation Services Office [phone] Secure Chat Preferred    Fredrich Jefferson 12/07/2023, 2:24 PM

## 2023-12-07 NOTE — Plan of Care (Signed)
  Problem: Clinical Measurements: Goal: Will remain free from infection Outcome: Progressing   Problem: Activity: Goal: Risk for activity intolerance will decrease Outcome: Progressing   Problem: Coping: Goal: Level of anxiety will decrease Outcome: Progressing

## 2023-12-08 DIAGNOSIS — S72401A Unspecified fracture of lower end of right femur, initial encounter for closed fracture: Secondary | ICD-10-CM | POA: Diagnosis not present

## 2023-12-08 LAB — RENAL FUNCTION PANEL
Albumin: 3 g/dL — ABNORMAL LOW (ref 3.5–5.0)
Anion gap: 7 (ref 5–15)
BUN: 8 mg/dL (ref 8–23)
CO2: 27 mmol/L (ref 22–32)
Calcium: 8.9 mg/dL (ref 8.9–10.3)
Chloride: 107 mmol/L (ref 98–111)
Creatinine, Ser: 0.48 mg/dL (ref 0.44–1.00)
GFR, Estimated: >60 mL/min (ref >60)
Glucose, Bld: 221 mg/dL — ABNORMAL HIGH (ref 70–99)
Phosphorus: 2.6 mg/dL (ref 2.5–4.6)
Potassium: 3.6 mmol/L (ref 3.5–5.1)
Sodium: 141 mmol/L (ref 135–145)

## 2023-12-08 LAB — MAGNESIUM: Magnesium: 1.9 mg/dL (ref 1.7–2.4)

## 2023-12-08 LAB — GLUCOSE, CAPILLARY
Glucose-Capillary: 130 mg/dL — ABNORMAL HIGH (ref 70–99)
Glucose-Capillary: 178 mg/dL — ABNORMAL HIGH (ref 70–99)
Glucose-Capillary: 164 mg/dL — ABNORMAL HIGH (ref 70–99)
Glucose-Capillary: 194 mg/dL — ABNORMAL HIGH (ref 70–99)

## 2023-12-08 LAB — CBC WITH DIFFERENTIAL/PLATELET
Abs Immature Granulocytes: 0.04 10*3/uL (ref 0.00–0.07)
Basophils Absolute: 0 10*3/uL (ref 0.0–0.1)
Basophils Relative: 0 %
Eosinophils Absolute: 0.1 10*3/uL (ref 0.0–0.5)
Eosinophils Relative: 1 %
HCT: 25.4 % — ABNORMAL LOW (ref 36.0–46.0)
Hemoglobin: 8.6 g/dL — ABNORMAL LOW (ref 12.0–15.0)
Immature Granulocytes: 1 %
Lymphocytes Relative: 28 %
Lymphs Abs: 1.8 10*3/uL (ref 0.7–4.0)
MCH: 30.2 pg (ref 26.0–34.0)
MCHC: 33.9 g/dL (ref 30.0–36.0)
MCV: 89.1 fL (ref 80.0–100.0)
Monocytes Absolute: 0.6 10*3/uL (ref 0.1–1.0)
Monocytes Relative: 10 %
Neutro Abs: 3.9 10*3/uL (ref 1.7–7.7)
Neutrophils Relative %: 60 %
Platelets: 147 10*3/uL — ABNORMAL LOW (ref 150–400)
RBC: 2.85 MIL/uL — ABNORMAL LOW (ref 3.87–5.11)
RDW: 14 % (ref 11.5–15.5)
WBC: 6.4 10*3/uL (ref 4.0–10.5)
nRBC: 0 % (ref 0.0–0.2)

## 2023-12-08 MED ORDER — ASPIRIN 325 MG PO TBEC: 325.0000 mg | DELAYED_RELEASE_TABLET | Freq: Every day | ORAL | 0 refills | Status: AC

## 2023-12-08 MED ORDER — POLYVINYL ALCOHOL 1.4 % OP SOLN
2.0000 [drp] | OPHTHALMIC | Status: DC | PRN
  Filled 2023-12-08: qty 15

## 2023-12-08 MED ORDER — METHOCARBAMOL 500 MG PO TABS: 500.0000 mg | ORAL_TABLET | Freq: Four times a day (QID) | ORAL | 0 refills | Status: DC | PRN

## 2023-12-08 MED ORDER — HYDROCODONE-ACETAMINOPHEN 5-325 MG PO TABS: 1.0000 | ORAL_TABLET | Freq: Four times a day (QID) | ORAL | 0 refills | Status: DC | PRN

## 2023-12-08 NOTE — Discharge Instructions (Signed)
 Orthopaedic Trauma Service Discharge Instructions   General Discharge Instructions  WEIGHT BEARING STATUS:Weightbearing as tolerated  RANGE OF MOTION/ACTIVITY: Ok for unrestricted range of motion of the knee  Wound Care: You may remove your surgical dressing. Incisions can be left open to air if there is no drainage. Once the incision is completely dry and without drainage, it may be left open to air out.  Showering may begin post op day #3 (Monday 12/09/23).  Clean incision gently with soap and water.  DVT/PE prophylaxis: Aspirin x 30 days  Diet: as you were eating previously.  Can use over the counter stool softeners and bowel preparations, such as Miralax, to help with bowel movements.  Narcotics can be constipating.  Be sure to drink plenty of fluids  PAIN MEDICATION USE AND EXPECTATIONS  You have likely been given narcotic medications to help control your pain.  After a traumatic event that results in an fracture (broken bone) with or without surgery, it is ok to use narcotic pain medications to help control one's pain.  We understand that everyone responds to pain differently and each individual patient will be evaluated on a regular basis for the continued need for narcotic medications. Ideally, narcotic medication use should last no more than 6-8 weeks (coinciding with fracture healing).   As a patient it is your responsibility as well to monitor narcotic medication use and report the amount and frequency you use these medications when you come to your office visit.   We would also advise that if you are using narcotic medications, you should take a dose prior to therapy to maximize you participation.  IF YOU ARE ON NARCOTIC MEDICATIONS IT IS NOT PERMISSIBLE TO OPERATE A MOTOR VEHICLE (MOTORCYCLE/CAR/TRUCK/MOPED) OR HEAVY MACHINERY DO NOT MIX NARCOTICS WITH OTHER CNS (CENTRAL NERVOUS SYSTEM) DEPRESSANTS SUCH AS ALCOHOL  POST-OPERATIVE OPIOID TAPER INSTRUCTIONS: It is important to  wean off of your opioid medication as soon as possible. If you do not need pain medication after your surgery it is ok to stop day one. Opioids include: Codeine, Hydrocodone(Norco, Vicodin), Oxycodone(Percocet, oxycontin) and hydromorphone amongst others.  Long term and even short term use of opiods can cause: Increased pain response Dependence Constipation Depression Respiratory depression And more.  Withdrawal symptoms can include Flu like symptoms Nausea, vomiting And more Techniques to manage these symptoms Hydrate well Eat regular healthy meals Stay active Use relaxation techniques(deep breathing, meditating, yoga) Do Not substitute Alcohol to help with tapering If you have been on opioids for less than two weeks and do not have pain than it is ok to stop all together.  Plan to wean off of opioids This plan should start within one week post op of your fracture surgery  Maintain the same interval or time between taking each dose and first decrease the dose.  Cut the total daily intake of opioids by one tablet each day Next start to increase the time between doses. The last dose that should be eliminated is the evening dose.    STOP SMOKING OR USING NICOTINE PRODUCTS!!!!  As discussed nicotine severely impairs your body's ability to heal surgical and traumatic wounds but also impairs bone healing.  Wounds and bone heal by forming microscopic blood vessels (angiogenesis) and nicotine is a vasoconstrictor (essentially, shrinks blood vessels).  Therefore, if vasoconstriction occurs to these microscopic blood vessels they essentially disappear and are unable to deliver necessary nutrients to the healing tissue.  This is one modifiable factor that you can do to dramatically  increase your chances of healing your injury.  (This means no smoking, no nicotine gum, patches, etc)  DO NOT USE NONSTEROIDAL ANTI-INFLAMMATORY DRUGS (NSAID'S)  Using products such as Advil (ibuprofen), Aleve  (naproxen), Motrin (ibuprofen) for additional pain control during fracture healing can delay and/or prevent the healing response.  If you would like to take over the counter (OTC) medication, Tylenol (acetaminophen) is ok.  However, some narcotic medications that are given for pain control contain acetaminophen as well. Therefore, you should not exceed more than 4000 mg of tylenol in a day if you do not have liver disease.  Also note that there are may OTC medicines, such as cold medicines and allergy medicines that my contain tylenol as well.  If you have any questions about medications and/or interactions please ask your doctor/PA or your pharmacist.      ICE AND ELEVATE INJURED/OPERATIVE EXTREMITY  Using ice and elevating the injured extremity above your heart can help with swelling and pain control.  Icing in a pulsatile fashion, such as 20 minutes on and 20 minutes off, can be followed.    Do not place ice directly on skin. Make sure there is a barrier between to skin and the ice pack.    Using frozen items such as frozen peas works well as the conform nicely to the are that needs to be iced.  USE AN ACE WRAP OR TED HOSE FOR SWELLING CONTROL  In addition to icing and elevation, Ace wraps or TED hose are used to help limit and resolve swelling.  It is recommended to use Ace wraps or TED hose until you are informed to stop.    When using Ace Wraps start the wrapping distally (farthest away from the body) and wrap proximally (closer to the body)   Example: If you had surgery on your leg or thing and you do not have a splint on, start the ace wrap at the toes and work your way up to the thigh        If you had surgery on your upper extremity and do not have a splint on, start the ace wrap at your fingers and work your way up to the upper arm   CALL THE OFFICE FOR MEDICATION REFILLS OR WITH ANY QUESTIONS/CONCERNS: [phone]   VISIT OUR WEBSITE FOR ADDITIONAL INFORMATION:  [email or web address]   Discharge Wound Care Instructions  Do NOT apply any ointments, solutions or lotions to pin sites or surgical wounds.  These prevent needed drainage and even though solutions like hydrogen peroxide kill bacteria, they also damage cells lining the pin sites that help fight infection.  Applying lotions or ointments can keep the wounds moist and can cause them to breakdown and open up as well. This can increase the risk for infection. When in doubt call the office.  Surgical incisions should be dressed daily.  If any drainage is noted, use one layer of adaptic or Mepitel, then gauze, Kerlix, and an ace wrap. - These dressing supplies should be available at local medical supply stores ([facility], [facility], etc) as well as Insurance claims handler ([facility], [facility], [place], etc)  Once the incision is completely dry and without drainage, it may be left open to air out.  Showering may begin 36-48 hours later.  Cleaning gently with soap and water.    Call office for the following: Temperature greater than 101F Persistent nausea and vomiting Severe uncontrolled pain Redness, tenderness, or signs of infection (pain, swelling, redness, odor or green/yellow discharge  around the site) Difficulty breathing, headache or visual disturbances Hives Persistent dizziness or light-headedness Extreme fatigue Any other questions or concerns you may have after discharge  In an emergency, call 911 or go to an Emergency Department at a nearby hospital  OTHER HELPFUL INFORMATION  If you had a block, it will wear off between 8-24 hrs postop typically.  This is period when your pain may go from nearly zero to the pain you would have had postop without the block.  This is an abrupt transition but nothing dangerous is happening.  You may take an extra dose of narcotic when this happens.  You should wean off your narcotic medicines as soon as you are able.  Most patients will be off or using  minimal narcotics before their first postop appointment.   We suggest you use the pain medication the first night prior to going to bed, in order to ease any pain when the anesthesia wears off. You should avoid taking pain medications on an empty stomach as it will make you nauseous.  Do not drink alcoholic beverages or take illicit drugs when taking pain medications.  In most states it is against the law to drive while you are in a splint or sling.  And certainly against the law to drive while taking narcotics.  You may return to work/school in the next couple of days when you feel up to it.   Pain medication may make you constipated.  Below are a few solutions to try in this order: Decrease the amount of pain medication if you aren't having pain. Drink lots of decaffeinated fluids. Drink prune juice and/or each dried prunes  If the first 3 don't work start with additional solutions Take Colace - an over-the-counter stool softener Take Senokot - an over-the-counter laxative Take Miralax - a stronger over-the-counter laxative

## 2023-12-08 NOTE — Progress Notes (Signed)
 PROGRESS NOTE    Renee Watts  OZH:[id number] DOB: 28-Apr-1951 DOA: 12/05/2023 PCP: Viola Greulich, MD  Outpatient Specialists:     Brief Narrative:  As per H&P done on admission: "Renee Watts is a 73 y.o. female with medical history significant for T2DM, HTN who presented to the ED for evaluation of right knee pain after a fall.   Patient states she normally ambulates with use of a cane.  She was walking outside with her cane when her right knee buckled causing her to fall onto the ground.  She says she actually fell onto her left side but when she tried to move her right leg she felt her right knee bent inwards.  She had significant pain and could not get up.   She did not hit her head or lose consciousness.  She denies any recent chest pain, dyspnea, lightheadedness, dizziness.   ED Course  Labs/Imaging on admission: I have personally reviewed following labs and imaging studies.   Initial vitals showed BP 171/76, pulse 59, RR 15, temp 98.3 F, SpO2 99% on room air.   Labs showed sodium 138, potassium 3.5, bicarb 23, BUN 9, creatinine 0.44, serum glucose 109, LFTs within normal limits, WBC 7.0, hemoglobin 11.3, platelets 153,000.   Right knee x-ray showed mildly displaced and minimally angulated fracture of the distal femur.  Right knee arthroplasty with periprosthetic lucency about the femoral component and distal femoral metadiaphysis, suspicious for loosening also noted.   Patient was given 500 cc normal saline, 0.5 mg IV Dilaudid.  Orthopedics were consulted and recommended medical admission with plan for ORIF tomorrow with Dr. Curtiss Dowdy.  The hospitalist service was consulted to admit".  12/06/2023: Patient seen.  Patient has undergone open reduction internal fixation of right supracondylar distal femur fracture.  Postop management as per orthopedic team. 12/07/2023: Patient seen.  No new complaints.  Pain is reasonably controlled.  Postop care as directed by orthopedic  team. 12/08/2023: Seen alongside patient's daughter.  Patient's daughter was updated.  Likely discharge tomorrow.   Assessment & Plan:   Principal Problem:   Closed fracture of distal end of right femur, unspecified fracture morphology, initial encounter (HCC) Active Problems:   Type II diabetes mellitus with neurological manifestations (HCC)   Hypertension associated with diabetes (HCC)   Normocytic anemia   Periprosthetic distal right femur fracture: -Fracture close following a fall. -Patient has undergone surgery. -Patient is stable postop. -Postop management as per orthopedic team.     Type 2 diabetes: Holding metformin.   Placed on SSI.   Hypertension: Continue lisinopril.   Normocytic anemia: Hemoglobin stable at 11.3.  Continue to monitor.   DVT prophylaxis: Subcutaneous Lovenox 40 Mg daily. Code Status: Full code. Family Communication:  Disposition Plan: Inpatient.   Consultants:  Orthopedic team.  Procedures:  open reduction internal fixation of right supracondylar distal femur fracture  Antimicrobials:  IV cefazolin discontinued.   Subjective: No new complaints.  Objective: Vitals:   12/07/23 1642 12/07/23 1938 12/08/23 0828 12/08/23 1541  BP: (!) 145/56 (!) 127/55 (!) 119/57 116/64  Pulse: (!) 104 71 76 71  Resp: 16 18 20    Temp: 98.5 F (36.9 C) 98.9 F (37.2 C) 98.6 F (37 C) 98 F (36.7 C)  TempSrc: Oral Oral    SpO2: 100% 97% 95% 95%  Weight:      Height:        Intake/Output Summary (Last 24 hours) at 12/08/2023 1855 Last data filed at 12/08/2023 1834 Gross per  24 hour  Intake 236 ml  Output --  Net 236 ml   Filed Weights   12/05/23 1440 12/06/23 0856  Weight: 76.2 kg (P) 76.2 kg    Examination:  General exam: Appears calm and comfortable  Respiratory system: Clear to auscultation.  Cardiovascular system: S1 & S2 heard. Gastrointestinal system: Abdomen is soft and non-tender Central nervous system: Awake and alert.   Moves all extremities. Extremities: Right lower extremity is wrapped.  Data Reviewed: I have personally reviewed following labs and imaging studies  CBC: Recent Labs  Lab 12/05/23 1710 12/06/23 0417 12/06/23 1648 12/07/23 0429 12/08/23 0838  WBC 7.0 5.5 8.6 7.2 6.4  NEUTROABS 5.2  --   --   --  3.9  HGB 11.3* 10.4* 11.0* 9.7* 8.6*  HCT 33.5* 30.1* 31.6* 28.2* 25.4*  MCV 89.6 89.1 88.5 87.9 89.1  PLT 153 143* 150 146* 147*   Basic Metabolic Panel: Recent Labs  Lab 12/05/23 1710 12/06/23 0417 12/06/23 1648 12/07/23 0429 12/08/23 0838  NA 138 142  --  140 141  K 3.5 3.7  --  3.3* 3.6  CL 105 107  --  104 107  CO2 23 25  --  26 27  GLUCOSE 109* 119*  --  132* 221*  BUN 9 6*  --  6* 8  CREATININE 0.44 0.44 0.42* 0.43* 0.48  CALCIUM 9.3 9.1  --  8.9 8.9  MG  --   --   --   --  1.9  PHOS  --   --   --   --  2.6   GFR: Estimated Creatinine Clearance: 64 mL/min (by C-G formula based on SCr of 0.48 mg/dL). Liver Function Tests: Recent Labs  Lab 12/05/23 1710 12/08/23 0838  AST 35  --   ALT 28  --   ALKPHOS 49  --   BILITOT 0.8  --   PROT 7.5  --   ALBUMIN 3.7 3.0*   No results for input(s): "LIPASE", "AMYLASE" in the last 168 hours. No results for input(s): "AMMONIA" in the last 168 hours. Coagulation Profile: No results for input(s): "INR", "PROTIME" in the last 168 hours. Cardiac Enzymes: No results for input(s): "CKTOTAL", "CKMB", "CKMBINDEX", "TROPONINI" in the last 168 hours. BNP (last 3 results) No results for input(s): "PROBNP" in the last 8760 hours. HbA1C: No results for input(s): "HGBA1C" in the last 72 hours. CBG: Recent Labs  Lab 12/07/23 1626 12/07/23 2104 12/08/23 0648 12/08/23 1208 12/08/23 1729  GLUCAP 158* 150* 130* 178* 164*   Lipid Profile: No results for input(s): "CHOL", "HDL", "LDLCALC", "TRIG", "CHOLHDL", "LDLDIRECT" in the last 72 hours. Thyroid Function Tests: No results for input(s): "TSH", "T4TOTAL", "FREET4", "T3FREE",  "THYROIDAB" in the last 72 hours. Anemia Panel: No results for input(s): "VITAMINB12", "FOLATE", "FERRITIN", "TIBC", "IRON", "RETICCTPCT" in the last 72 hours. Urine analysis:    Component Value Date/Time   COLORURINE YELLOW 01/05/2013 0900   APPEARANCEUR CLOUDY (A) 01/05/2013 0900   LABSPEC 1.019 01/05/2013 0900   PHURINE 7.0 01/05/2013 0900   GLUCOSEU NEGATIVE 01/05/2013 0900   GLUCOSEU NEG mg/dL 16/05/9603 5409   HGBUR NEGATIVE 01/05/2013 0900   BILIRUBINUR neg 03/25/2023 1152   KETONESUR NEGATIVE 01/05/2013 0900   PROTEINUR Negative 03/25/2023 1152   PROTEINUR NEGATIVE 01/05/2013 0900   UROBILINOGEN negative (A) 03/25/2023 1152   UROBILINOGEN 1.0 01/05/2013 0900   NITRITE neg 03/25/2023 1152   NITRITE NEGATIVE 01/05/2013 0900   LEUKOCYTESUR Negative 03/25/2023 1152   Sepsis Labs: @LABRCNTIP (procalcitonin:4,lacticidven:4)  )  Recent Results (from the past 240 hours)  Surgical pcr screen     Status: None   Collection Time: 12/06/23  3:23 AM   Specimen: Nasal Mucosa; Nasal Swab  Result Value Ref Range Status   MRSA, PCR NEGATIVE NEGATIVE Final   Staphylococcus aureus NEGATIVE NEGATIVE Final    Comment: (NOTE) The Xpert SA Assay (FDA approved for NASAL specimens in patients 30 years of age and older), is one component of a comprehensive surveillance program. It is not intended to diagnose infection nor to guide or monitor treatment. Performed at [facility], [address], [place], [place] [address]          Radiology Studies: No results found.       Scheduled Meds:  docusate sodium  100 mg Oral BID   enoxaparin (LOVENOX) injection  40 mg Subcutaneous Q24H   feeding supplement  237 mL Oral BID BM   insulin aspart  0-9 Units Subcutaneous TID WC   lisinopril  20 mg Oral Q breakfast   multivitamin with minerals  1 tablet Oral Daily   Continuous Infusions:  sodium chloride Stopped (12/06/23 1645)     LOS: 3 days    Time spent: 35  minutes.    Fonnie Iba, MD  [facility] Pager #: [phone] 7PM-7AM contact night coverage as above

## 2023-12-08 NOTE — Progress Notes (Signed)
   ORTHOPAEDIC PROGRESS NOTE  s/p Procedure(s): OPEN REDUCTION INTERNAL FIXATION (ORIF) DISTAL FEMUR FRACTURE  SUBJECTIVE: Reports mild to moderate pain about operative site. Did great with PT yesterday ambulating.  No chest pain. No SOB. No nausea/vomiting. No other complaints.  OBJECTIVE: PE: General: sitting in hospital bed, NAD RLE: dressing CDI, warm well perfused foot, intact EHL/TA/GSC   Vitals:   12/07/23 1642 12/07/23 1938  BP: (!) 145/56 (!) 127/55  Pulse: (!) 104 71  Resp: 16 18  Temp: 98.5 F (36.9 C) 98.9 F (37.2 C)  SpO2: 100% 97%     ASSESSMENT: Renee Watts is a 73 y.o. female POD#2  PLAN: Weightbearing: WBAT RLE Insicional and dressing care: Reinforce dressings as needed Orthopedic device(s): None Showering: Hold for now VTE prophylaxis: Lovenox Pain control: PRN pain medications, minimize narcotics as able Follow - up plan: 2 weekes in office with Dr. Curtiss Dowdy Dispo: TBD. PT/OT input needed.  Contact information: After hours and holidays please check [email or web address] for group call information for [facility]

## 2023-12-08 NOTE — TOC CAGE-AID Note (Signed)
 Transition of Care [facility]) - CAGE-AID Screening  Patient Details  Name: Renee Watts MRN: [id number] Date of Birth: [birth year]  Clinical Narrative:  Patient denies any current alcohol or drug use, no need to provide substance abuse resources at this time.  CAGE-AID Screening:   Have You Ever Felt You Ought to Cut Down on Your Drinking or Drug Use?: No Have People Annoyed You By Critizing Your Drinking Or Drug Use?: No Have You Felt Bad Or Guilty About Your Drinking Or Drug Use?: No Have You Ever Had a Drink or Used Drugs First Thing In The Morning to Steady Your Nerves or to Get Rid of a Hangover?: No CAGE-AID Score: 0  Substance Abuse Education Offered: No

## 2023-12-08 NOTE — Plan of Care (Signed)
   Problem: Activity: Goal: Risk for activity intolerance will decrease Outcome: Progressing   Problem: Coping: Goal: Level of anxiety will decrease Outcome: Progressing   Problem: Pain Managment: Goal: General experience of comfort will improve and/or be controlled Outcome: Progressing

## 2023-12-08 NOTE — Progress Notes (Addendum)
 Mobility Specialist: Progress Note   12/08/23 1246  Mobility  Activity Ambulated with assistance in hallway  Level of Assistance Contact guard assist, steadying assist (+2 for safety, pt requested)  Assistive Device Front wheel walker  Distance Ambulated (ft) 65 ft  RLE Weight Bearing Per Provider Order WBAT  Activity Response Tolerated well  Mobility Referral Yes  Mobility visit 1 Mobility  Mobility Specialist Start Time (ACUTE ONLY) 1149  Mobility Specialist Stop Time (ACUTE ONLY) 1205  Mobility Specialist Time Calculation (min) (ACUTE ONLY) 16 min    Pt received in bed and agreeable to mobility session. MinA for bed mobility only to assist RLE off EOB. MinA for STS. CG for ambulation. Requested a second person for safety but did not need the physical +2 assistance. C/o RLE pain mostly when getting EOB and towards the end of ambulation. Returned to room without fault. Left in chair with all needs met, call bell in reach.   Deloria Fetch Mobility Specialist Please contact via SecureChat or Rehab office at [phone]

## 2023-12-08 NOTE — Progress Notes (Signed)
 PT Cancellation Note  Patient Details Name: Renee Watts MRN: [id number] DOB: 09/25/1950   Cancelled Treatment:    Reason Eval/Treat Not Completed: Fatigue/lethargy limiting ability to participate  Patient reported recently back to bed, eating her lunch, and really did not want to participate in PT at this time. Will plan to see early 4/14 as pt anticipates discharge home.    Gayle Kava, PT Acute Rehabilitation Services  Office [phone]  [place] Leep 12/08/2023, 3:29 PM

## 2023-12-09 ENCOUNTER — Encounter (HOSPITAL_COMMUNITY): Payer: Self-pay | Admitting: Student

## 2023-12-09 ENCOUNTER — Encounter: Admitting: Orthopedic Surgery

## 2023-12-09 DIAGNOSIS — S72401A Unspecified fracture of lower end of right femur, initial encounter for closed fracture: Secondary | ICD-10-CM | POA: Diagnosis not present

## 2023-12-09 LAB — GLUCOSE, CAPILLARY
Glucose-Capillary: 148 mg/dL — ABNORMAL HIGH (ref 70–99)
Glucose-Capillary: 117 mg/dL — ABNORMAL HIGH (ref 70–99)

## 2023-12-09 MED ORDER — POLYVINYL ALCOHOL 1.4 % OP SOLN: 2.0000 [drp] | OPHTHALMIC | 0 refills | Status: AC | PRN

## 2023-12-09 MED ORDER — ACETAMINOPHEN 325 MG PO TABS: 650.0000 mg | ORAL_TABLET | Freq: Four times a day (QID) | ORAL | Status: AC | PRN

## 2023-12-09 MED ORDER — ENSURE ENLIVE PO LIQD: 237.0000 mL | Freq: Two times a day (BID) | ORAL | 12 refills | Status: AC

## 2023-12-09 MED ORDER — ADULT MULTIVITAMIN W/MINERALS CH: 1.0000 | ORAL_TABLET | Freq: Every day | ORAL | 0 refills | Status: AC

## 2023-12-09 NOTE — Progress Notes (Signed)
 Discharge packet (AVS) has been provided to pt with instructions. Pt verbalized understanding of instructions. No complaints. Pt d/c to home as ordered, She remains alert/oriented in no apparent distress. Per pt her daughter is responsible for her ride.

## 2023-12-09 NOTE — Discharge Summary (Signed)
 Physician Discharge Summary  Patient ID: Renee Watts MRN: [id number] DOB/AGE: 1950/11/15 73 y.o.  Admit date: 12/05/2023 Discharge date: 12/09/2023  Admission Diagnoses:  Discharge Diagnoses:  Principal Problem:   Closed fracture of distal end of right femur, unspecified fracture morphology, initial encounter (HCC) Active Problems:   Type II diabetes mellitus with neurological manifestations (HCC)   Hypertension associated with diabetes (HCC)   Normocytic anemia   Discharged Condition: stable  Hospital Course: Patient is a 73 year old female with past medical history significant for type 2 diabetes mellitus and hypertension.  Patient was admitted with right knee pain following a fall.  Imaging studies revealed right knee arthroplasty with periprosthetic lucency about the femoral component and distal femoral metadiaphysis, suspicious for loosening mildly displaced and minimally angulated fracture of the distal femur.  Patient was admitted for further assessment and management.  Orthopedic team was consulted to assist with patient's management.  Patient underwent open reduction and internal fixation of right supracondylar distal femur fracture.  Patient will follow-up with primary care provider and orthopedic team on discharge.  Periprosthetic distal right femur fracture: - Orthopedic team assisted with patient's management. - Patient underwent open reduction and internal fixation of right supracondylar distal femur fracture. -Patient will follow-up with primary care provider and orthopedic team on discharge.   Type 2 diabetes: Held metformin  during the hospital stay Placed on SSI (sliding scale insulin  coverage).   Hypertension: Continued lisinopril .   Normocytic anemia: Hemoglobin stable at 11.3.  Continue to monitor.  Consults: orthopedic surgery  Significant Diagnostic Studies:  RIGHT KNEE - COMPLETE 4+ VIEW:   COMPARISON:  Radiograph 06/25/2023   FINDINGS: Right  knee arthroplasty. Periprosthetic lucency about the femoral component and distal femoral metadiaphysis. Fracture is mildly displaced and minimally angulated. No convincing knee joint effusion or lipohemarthrosis. Patellar baja was present on preoperative exam. Mild soft tissue edema.   IMPRESSION: 1. Right knee arthroplasty with periprosthetic lucency about the femoral component and distal femoral metadiaphysis, suspicious for loosening. 2. Mildly displaced and minimally angulated fracture of the distal femur.     Electronically Signed   By: Chadwick Colonel M.D.   On: 12/05/2023 16:50  Treatments:  Open reduction and internal fixation of right supracondylar distal femur fracture  Discharge Exam: Blood pressure (!) 149/64, pulse 76, temperature 98.2 F (36.8 C), resp. rate 16, height (P) 5\' 5"  (1.651 m), weight (P) 76.2 kg, SpO2 96%.   Disposition: Discharge disposition: 06-Home-Health Care Svc       Discharge Instructions     Diet - low sodium heart healthy   Complete by: As directed    Increase activity slowly   Complete by: As directed       Allergies as of 12/09/2023   No Known Allergies      Medication List     STOP taking these medications    acetaminophen  650 MG CR tablet Commonly known as: TYLENOL  Replaced by: acetaminophen  325 MG tablet   CALCIUM PO   Fish Oil 1000 MG Caps   furosemide  40 MG tablet Commonly known as: LASIX    ondansetron  4 MG tablet Commonly known as: ZOFRAN    potassium chloride  SA 20 MEQ tablet Commonly known as: KLOR-CON  M   promethazine  12.5 MG tablet Commonly known as: PHENERGAN    traMADol  50 MG tablet Commonly known as: ULTRAM        TAKE these medications    Accu-Chek Softclix Lancets lancets daily. for testing as directed   OneTouch Delica Plus Lancet30G Misc USE DAILY AS  DIRECTED   acetaminophen  325 MG tablet Commonly known as: TYLENOL  Take 2 tablets (650 mg total) by mouth every 6 (six) hours as  needed for mild pain (pain score 1-3), headache or fever. Replaces: acetaminophen  650 MG CR tablet   aspirin  EC 325 MG tablet Take 1 tablet (325 mg total) by mouth daily.   cholecalciferol 25 MCG (1000 UNIT) tablet Commonly known as: VITAMIN D3 Take 1,000 Units by mouth in the morning.   cyanocobalamin  1000 MCG tablet Commonly known as: VITAMIN B12 Take 1,000 mcg by mouth daily with breakfast.   feeding supplement Liqd Take 237 mLs by mouth 2 (two) times daily between meals.   ferrous gluconate  324 MG tablet Commonly known as: FERGON Take 1 tablet (324 mg total) by mouth daily. What changed: when to take this   fluticasone  50 MCG/ACT nasal spray Commonly known as: FLONASE  Place 1 spray into both nostrils daily. What changed:  when to take this reasons to take this   gabapentin  300 MG capsule Commonly known as: NEURONTIN  TAKE 1 CAPSULE BY MOUTH IN THE MORNING, 1 AT LUNCH, AND 2 AT BEDTIME What changed:  how much to take how to take this when to take this additional instructions   HYDROcodone -acetaminophen  5-325 MG tablet Commonly known as: NORCO/VICODIN Take 1-2 tablets by mouth every 6 (six) hours as needed for moderate pain (pain score 4-6) or severe pain (pain score 7-10).   levocetirizine 5 MG tablet Commonly known as: XYZAL  Take 5 mg by mouth every evening.   lisinopril  20 MG tablet Commonly known as: ZESTRIL  TAKE 1 TABLET BY MOUTH EVERY DAY What changed: when to take this   metFORMIN  750 MG 24 hr tablet Commonly known as: GLUCOPHAGE -XR Take 1 tablet (750 mg total) by mouth daily with breakfast. What changed: when to take this   methocarbamol  500 MG tablet Commonly known as: ROBAXIN  Take 1 tablet (500 mg total) by mouth every 6 (six) hours as needed for muscle spasms. What changed:  medication strength how much to take reasons to take this   multivitamin with minerals Tabs tablet Take 1 tablet by mouth daily. Start taking on: December 10, 2023    omeprazole  20 MG tablet Commonly known as: PRILOSEC  OTC Take 20 mg by mouth daily as needed (acid reflux).   OneTouch Verio IQ System w/Device Kit by Does not apply route.   OneTouch Verio test strip Generic drug: glucose blood USE DAILY AS DIRECTED   polyvinyl alcohol  1.4 % ophthalmic solution Commonly known as: LIQUIFILM TEARS Place 2 drops into both eyes as needed for dry eyes (As needed for dry eyes).   Rollator Ultra-Light Misc 1 Device by Does not apply route as directed.   vitamin C 1000 MG tablet Take 1,000 mg by mouth daily with breakfast.               Durable Medical Equipment  (From admission, onward)           Start     Ordered   12/09/23 1407  For home use only DME Bedside commode  Once       Comments: Patient with Closed Fracture of Distal End of Rt Femur, s/p ORIF of Rt Distal Femur on 12/06/23. Patient with decreased activity tolerance necessitate recommendation for bedside commode as she is not able to ambulate to the bathroom.  Question:  Patient needs a bedside commode to treat with the following condition  Answer:  Weakness   12/09/23 1412  Follow-up Information     Haddix, Florentina Huntsman, MD. Schedule an appointment as soon as possible for a visit in 2 week(s).   Specialty: Orthopedic Surgery Why: for wound check and repeat x-rays Contact information: [address] [place] [address] [phone]                Time spent: 35 minutes.  SignedDoroteo Gasmen 12/09/2023, 2:33 PM

## 2023-12-09 NOTE — Plan of Care (Signed)
  Problem: Skin Integrity: Goal: Risk for impaired skin integrity will decrease Outcome: Progressing   Problem: Health Behavior/Discharge Planning: Goal: Ability to manage health-related needs will improve Outcome: Progressing   Problem: Pain Managment: Goal: General experience of comfort will improve and/or be controlled Outcome: Progressing

## 2023-12-09 NOTE — Transfer of Care (Signed)
 Immediate Anesthesia Transfer of Care Note  Patient: Renee Watts  Procedure(s) Performed: OPEN REDUCTION INTERNAL FIXATION (ORIF) DISTAL FEMUR FRACTURE (Right: Leg Upper)  Patient Location: PACU  Anesthesia Type:General  Level of Consciousness: awake, alert , and oriented  Airway & Oxygen Therapy: Patient Spontanous Breathing and Patient connected to nasal cannula oxygen  Post-op Assessment: Report given to RN and Post -op Vital signs reviewed and stable  Post vital signs: Reviewed and stable  Last Vitals:  Vitals Value Taken Time  BP 165/91 12/09/23 0514  Temp 37.2 C 12/09/23 0514  Pulse 99 12/09/23 0514  Resp 17 12/09/23 0514  SpO2 100 % 12/09/23 0514    Last Pain:  Vitals:   12/09/23 1148  TempSrc:   PainSc: 0-No pain      Patients Stated Pain Goal: 3 (12/09/23 0352)  Complications: No notable events documented.

## 2023-12-09 NOTE — Progress Notes (Cosign Needed)
    Durable Medical Equipment  (From admission, onward)           Start     Ordered   12/09/23 1407  For home use only DME Bedside commode  Once       Comments: Patient with Closed Fracture of Distal End of Rt Femur, s/p ORIF of Rt Distal Femur on 12/06/23. Patient with decreased activity tolerance necessitate recommendation for bedside commode as she is not able to ambulate to the bathroom.  Question:  Patient needs a bedside commode to treat with the following condition  Answer:  Weakness   12/09/23 1412

## 2023-12-09 NOTE — TOC Transition Note (Signed)
 Transition of Care [facility]) - Discharge Note   Patient Details  Name: Renee Watts MRN: [id number] Date of Birth: [birth year]  [facility]) CM/SW Contact:  Tom-Johnson, Angelique Ken, RN Phone Number: 12/09/2023, 3:32 PM   Clinical Narrative:     Patient is scheduled for discharge today.  Readmission Risk Assessment done. Home health info, Outpatient f/u, hospital f/u and discharge instructions on AVS. BSC recommended, patient states she has all recommended DME's at home. Daughter, Cathy Cobbs to transport at discharge.  No further TOC needs noted.        Final next level of care: Home w Home Health Services Barriers to Discharge: Barriers Resolved   Patient Goals and CMS Choice Patient states their goals for this hospitalization and ongoing recovery are:: To return home CMS Medicare.gov Compare Post Acute Care list provided to:: Patient Choice offered to / list presented to : Patient      Discharge Placement                Patient to be transferred to facility by: Daughter Name of family member notified: Tamela    Discharge Plan and Services Additional resources added to the After Visit Summary for                  DME Arranged: N/A DME Agency: NA       HH Arranged: PT HH Agency: [facility] Date [facility] Agency Contacted: 12/09/23 Time HH Agency Contacted: 1520    Social Drivers of Health (SDOH) Interventions SDOH Screenings   Food Insecurity: No Food Insecurity (12/05/2023)  Housing: Low Risk  (12/05/2023)  Transportation Needs: No Transportation Needs (12/05/2023)  Utilities: Not At Risk (12/05/2023)  Alcohol Screen: Low Risk  (03/18/2023)  Depression (PHQ2-9): Medium Risk (11/27/2023)  Financial Resource Strain: Low Risk  (03/18/2023)  Physical Activity: Inactive (03/18/2023)  Social Connections: Moderately Integrated (12/05/2023)  Stress: No Stress Concern Present (03/18/2023)  Tobacco Use: Low Risk  (12/06/2023)  Health Literacy:  Adequate Health Literacy (03/18/2023)     Readmission Risk Interventions    12/09/2023    3:28 PM  Readmission Risk Prevention Plan  Transportation Screening Complete  PCP or Specialist Appt within 5-7 Days Complete  Home Care Screening Complete  Medication Review (RN CM) Referral to Pharmacy

## 2023-12-09 NOTE — Anesthesia Postprocedure Evaluation (Signed)
 Anesthesia Post Note  Patient: Renee Watts  Procedure(s) Performed: OPEN REDUCTION INTERNAL FIXATION (ORIF) DISTAL FEMUR FRACTURE (Right: Leg Upper)     Patient location during evaluation: PACU Anesthesia Type: General Level of consciousness: awake and alert Pain management: pain level controlled Vital Signs Assessment: post-procedure vital signs reviewed and stable Respiratory status: spontaneous breathing, nonlabored ventilation, respiratory function stable and patient connected to nasal cannula oxygen Cardiovascular status: blood pressure returned to baseline and stable Postop Assessment: no apparent nausea or vomiting Anesthetic complications: no   No notable events documented.           Leslye Rast

## 2023-12-09 NOTE — Plan of Care (Signed)
   Problem: Education: Goal: Knowledge of General Education information will improve Description: Including pain rating scale, medication(s)/side effects and non-pharmacologic comfort measures Outcome: Progressing   Problem: Activity: Goal: Risk for activity intolerance will decrease Outcome: Progressing

## 2023-12-09 NOTE — Progress Notes (Signed)
 Orthopaedic Trauma Progress Note  SUBJECTIVE: Patient doing really well this morning.  Notes some heaviness throughout the right leg which we discussed is normal given her injury and surgery.    No chest pain. No SOB. No nausea/vomiting. No other complaints. Sounds like patient has a great support system at home.  She feels comfortable discharge today.  OBJECTIVE:  Vitals:   12/08/23 2009 12/09/23 0514  BP: 134/68 (!) 165/91  Pulse: 79 99  Resp: 17 17  Temp: 98.6 F (37 C) 98.9 F (37.2 C)  SpO2: 97% 100%    Opiates Today (MME): Today's  total administered Morphine Milligram Equivalents: 10 Opiates Yesterday (MME): Yesterday's total administered Morphine Milligram Equivalents: 41.5  General: Sitting up in bed, no acute distress.  Pleasant and cooperative Respiratory: No increased work of breathing.  Operative Extremity (RLE): Dressing removed, incisions are clean, dry, intact.  Tenderness throughout the distal thigh as expected.  No significant tenderness over the hip or throughout the lower leg.  No calf tenderness.  Ankle dorsiflexion/plantarflexion intact. + EHL/FHL.  Endorses sensation to light touch over all aspects of the foot.  2+ DP pulse.  IMAGING: Stable post op imaging.   LABS:  Results for orders placed or performed during the hospital encounter of 12/05/23 (from the past 24 hours)  Renal function panel     Status: Abnormal   Collection Time: 12/08/23  8:38 AM  Result Value Ref Range   Sodium 141 135 - 145 mmol/L   Potassium 3.6 3.5 - 5.1 mmol/L   Chloride 107 98 - 111 mmol/L   CO2 27 22 - 32 mmol/L   Glucose, Bld 221 (H) 70 - 99 mg/dL   BUN 8 8 - 23 mg/dL   Creatinine, Ser 4.78 0.44 - 1.00 mg/dL   Calcium 8.9 8.9 - 29.5 mg/dL   Phosphorus 2.6 2.5 - 4.6 mg/dL   Albumin 3.0 (L) 3.5 - 5.0 g/dL   GFR, Estimated >62 >13 mL/min   Anion gap 7 5 - 15  Magnesium     Status: None   Collection Time: 12/08/23  8:38 AM  Result Value Ref Range   Magnesium 1.9 1.7 - 2.4  mg/dL  CBC with Differential/Platelet     Status: Abnormal   Collection Time: 12/08/23  8:38 AM  Result Value Ref Range   WBC 6.4 4.0 - 10.5 K/uL   RBC 2.85 (L) 3.87 - 5.11 MIL/uL   Hemoglobin 8.6 (L) 12.0 - 15.0 g/dL   HCT 08.6 (L) 57.8 - 46.9 %   MCV 89.1 80.0 - 100.0 fL   MCH 30.2 26.0 - 34.0 pg   MCHC 33.9 30.0 - 36.0 g/dL   RDW 62.9 52.8 - 41.3 %   Platelets 147 (L) 150 - 400 K/uL   nRBC 0.0 0.0 - 0.2 %   Neutrophils Relative % 60 %   Neutro Abs 3.9 1.7 - 7.7 K/uL   Lymphocytes Relative 28 %   Lymphs Abs 1.8 0.7 - 4.0 K/uL   Monocytes Relative 10 %   Monocytes Absolute 0.6 0.1 - 1.0 K/uL   Eosinophils Relative 1 %   Eosinophils Absolute 0.1 0.0 - 0.5 K/uL   Basophils Relative 0 %   Basophils Absolute 0.0 0.0 - 0.1 K/uL   Immature Granulocytes 1 %   Abs Immature Granulocytes 0.04 0.00 - 0.07 K/uL  Glucose, capillary     Status: Abnormal   Collection Time: 12/08/23 12:08 PM  Result Value Ref Range   Glucose-Capillary 178 (H)  70 - 99 mg/dL  Glucose, capillary     Status: Abnormal   Collection Time: 12/08/23  5:29 PM  Result Value Ref Range   Glucose-Capillary 164 (H) 70 - 99 mg/dL  Glucose, capillary     Status: Abnormal   Collection Time: 12/08/23  9:16 PM  Result Value Ref Range   Glucose-Capillary 194 (H) 70 - 99 mg/dL   Comment 1 Notify RN   Glucose, capillary     Status: Abnormal   Collection Time: 12/09/23  6:51 AM  Result Value Ref Range   Glucose-Capillary 148 (H) 70 - 99 mg/dL   Comment 1 Notify RN     ASSESSMENT: Renee Watts is a 73 y.o. female, 3 Days Post-Op s/p mechanical fall Procedures: OPEN REDUCTION INTERNAL FIXATION RIGHT PERIPROSTHETIC DISTAL FEMUR FRACTURE  CV/Blood loss: Acute blood loss anemia, Hgb 8.6 on 12/08/2023.  Hemodynamically stable  PLAN: Weightbearing: WBAT RLE ROM: Okay for unrestricted ROM Incisional and dressing care: Okay to leave incisions open to air Showering: Okay to begin getting incisions wet Orthopedic  device(s): None  Pain management:  1. Tylenol 650 mg q 6 hours PRN 2. Robaxin 500 mg q 6 hours PRN 3. Norco 5-325 mg q 6 hours PRN 4. Morphine 0.5 mg q 3 hours PRN VTE prophylaxis: Lovenox, SCDs ID:  Ancef 2gm post op complete Foley/Lines:  No foley, KVO IVFs Impediments to Fracture Healing: Vit D level 21, currently on Vit D3 supplementation. Plan to continue this Dispo: PT/OT eval ongoing, recommending HH. Okay for discharge from ortho standpoint once cleared by medicine team and therapies. Have sent d/c rx for pain medication, DVT ppx, and muscle relaxer to patient's pharmacy on file.   D/C recommendations: - Norco, Robaxin, Tylenol for pain control - ASA 325 mg for DVT prophylaxis - Continue home dose Vit D supplementation  Follow - up plan: 2 weeks after d/c for wound check and repeat x-rays   Contact information:  Katheryne Pane MD, Alona Jamaica PA-C. After hours and holidays please check [email or web address] for group call information for [facility]   Edilia Gordon, PA-C [phone] (office) [email or web address]

## 2023-12-09 NOTE — Progress Notes (Signed)
 Physical Therapy Treatment Patient Details Name: Renee Watts MRN: [id number] DOB: 09/20/1950 Today's Date: 12/09/2023   History of Present Illness Pt is a 73 y/o female presenting on 12/05/23 with right knee pain s/p mechanical fall. Admitted with closed fracture of distal end of right femur. S/p R ORIF of right distal femur on 12/06/23. PMH DM2, HTN, depression/anxiety, R THA, Bil TKA, bladder surgery, GERD, recent back surgery.    PT Comments  Pt tolerates treatment well, ambulating for increased distances and completing stair training without physical assistance. Pt is progressing well and is encouraged to mobilize frequently to continue improving ROM and strength. PT recommends discharge home with HHPT when medically ready.    If plan is discharge home, recommend the following: A little help with walking and/or transfers;A little help with bathing/dressing/bathroom;Assistance with cooking/housework;Assist for transportation;Help with stairs or ramp for entrance   Can travel by private vehicle        Equipment Recommendations  BSC/3in1    Recommendations for Other Services       Precautions / Restrictions Precautions Precautions: Fall Precaution Booklet Issued: No Restrictions Weight Bearing Restrictions Per Provider Order: Yes RLE Weight Bearing Per Provider Order: Weight bearing as tolerated     Mobility  Bed Mobility                    Transfers Overall transfer level: Needs assistance Equipment used: Rolling walker (2 wheels) Transfers: Sit to/from Stand Sit to Stand: Supervision                Ambulation/Gait Ambulation/Gait assistance: Supervision Gait Distance (Feet): 70 Feet Assistive device: Rolling walker (2 wheels) Gait Pattern/deviations: Step-through pattern Gait velocity: reduced Gait velocity interpretation: <1.8 ft/sec, indicate of risk for recurrent falls   General Gait Details: slowed step-through gait, reduced stance time on  RLE   Stairs Stairs: Yes Stairs assistance: Contact guard assist Stair Management: One rail Right, Sideways Number of Stairs: 2     Wheelchair Mobility     Tilt Bed    Modified Rankin (Stroke Patients Only)       Balance Overall balance assessment: Needs assistance Sitting-balance support: No upper extremity supported, Feet supported Sitting balance-Leahy Scale: Good     Standing balance support: Bilateral upper extremity supported, Reliant on assistive device for balance Standing balance-Leahy Scale: Poor                              Communication Communication Communication: No apparent difficulties  Cognition Arousal: Alert Behavior During Therapy: WFL for tasks assessed/performed   PT - Cognitive impairments: No apparent impairments                         Following commands: Intact      Cueing Cueing Techniques: Verbal cues  Exercises      General Comments General comments (skin integrity, edema, etc.): VSS on RA      Pertinent Vitals/Pain Pain Assessment Pain Assessment: Faces Faces Pain Scale: Hurts even more Pain Location: RLE Pain Descriptors / Indicators: Grimacing Pain Intervention(s): Monitored during session    Home Living                          Prior Function            PT Goals (current goals can now be found in the care plan section) Acute  Rehab PT Goals Patient Stated Goal: improve mobility Progress towards PT goals: Progressing toward goals    Frequency    Min 2X/week      PT Plan      Co-evaluation              AM-PAC PT "6 Clicks" Mobility   Outcome Measure  Help needed turning from your back to your side while in a flat bed without using bedrails?: A Little Help needed moving from lying on your back to sitting on the side of a flat bed without using bedrails?: A Little Help needed moving to and from a bed to a chair (including a wheelchair)?: A Little Help needed  standing up from a chair using your arms (e.g., wheelchair or bedside chair)?: A Little Help needed to walk in hospital room?: A Little Help needed climbing 3-5 steps with a railing? : A Little 6 Click Score: 18    End of Session Equipment Utilized During Treatment: Gait belt Activity Tolerance: Patient tolerated treatment well Patient left: in chair;with call bell/phone within reach;with chair alarm set Nurse Communication: Mobility status PT Visit Diagnosis: Other abnormalities of gait and mobility (R26.89);Muscle weakness (generalized) (M62.81);Difficulty in walking, not elsewhere classified (R26.2);Pain Pain - Right/Left: Right Pain - part of body: Leg     Time: 1325-1343 PT Time Calculation (min) (ACUTE ONLY): 18 min  Charges:    $Gait Training: 8-22 mins PT General Charges $$ ACUTE PT VISIT: 1 Visit                     Rexie Catena, PT, DPT Acute Rehabilitation Office [phone]    Rexie Catena 12/09/2023, 1:49 PM

## 2023-12-09 NOTE — Progress Notes (Signed)
 Occupational Therapy Treatment Patient Details Name: Renee Watts MRN: [id number] DOB: 02/15/1951 Today's Date: 12/09/2023   History of present illness Pt is a 73 y/o female presenting on 12/05/23 with right knee pain s/p mechanical fall. Admitted with closed fracture of distal end of right femur. S/p R ORIF of right distal femur on 12/06/23. PMH DM2, HTN, depression/anxiety, R THA, Bil TKA, bladder surgery, GERD, recent back surgery.   OT comments  Pt progressing well and has an excellent support system at home who can assist as needed with ADL and mobility. Pt overall CGA for mobility and Min A with LB ADL tasks. Pt has 2 STE and will need education on steps prior to DC. Discussed with PT and they plan to see today for stair training. No OT needed after DC.       If plan is discharge home, recommend the following:  A little help with walking and/or transfers;A lot of help with bathing/dressing/bathroom;Assistance with cooking/housework;Assist for transportation;Help with stairs or ramp for entrance   Equipment Recommendations  BSC/3in1    Recommendations for Other Services      Precautions / Restrictions Precautions Precautions: Fall;Back Precaution Booklet Issued: No Recall of Precautions/Restrictions: Intact Precaution/Restrictions Comments: back surgery 09/17/23 Restrictions Weight Bearing Restrictions Per Provider Order: No RLE Weight Bearing Per Provider Order: Weight bearing as tolerated       Mobility Bed Mobility Overal bed mobility: Modified Independent (supine - sit - educated on use of a sheet if needed; pt states she can use her theraband)                  Transfers Overall transfer level: Needs assistance Equipment used: Rolling walker (2 wheels) Transfers: Sit to/from Stand Sit to Stand: Supervision                 Balance Overall balance assessment: Needs assistance Sitting-balance support: No upper extremity supported, Feet supported,  Bilateral upper extremity supported Sitting balance-Leahy Scale: Good Sitting balance - Comments: pt sits EOB with R LE further extended than L. No LOB and slight posterior lean     Standing balance-Leahy Scale: Poor Standing balance comment: pt reliant on RW for standing                           ADL either performed or assessed with clinical judgement   ADL Overall ADL's : Needs assistance/impaired     Grooming: Set up;Sitting       Lower Body Bathing: Minimal assistance;Sit to/from stand   Upper Body Dressing : Set up;Sitting   Lower Body Dressing: Sit to/from stand;Minimal assistance   Toilet Transfer: Ambulation;Rolling walker (2 wheels);BSC/3in1;Regular Toilet;Contact guard assist Toilet Transfer Details (indicate cue type and reason): 3:1 over toilet Toileting- Clothing Manipulation and Hygiene: Minimal assistance;Sit to/from stand       Functional mobility during ADLs: Minimal assistance;Rolling walker (2 wheels);Cueing for safety      Extremity/Trunk Assessment Upper Extremity Assessment Upper Extremity Assessment: Overall WFL for tasks assessed   Lower Extremity Assessment Lower Extremity Assessment: Defer to PT evaluation        Vision   Vision Assessment?: No apparent visual deficits   Perception     Praxis     Communication Communication Communication: No apparent difficulties   Cognition Arousal: Alert Behavior During Therapy: WFL for tasks assessed/performed Cognition: No apparent impairments  Following commands: Intact        Cueing      Exercises Exercises: Other exercises Other Exercises Other Exercises: incentive spriometer    Shoulder Instructions       General Comments      Pertinent Vitals/ Pain       Pain Assessment Pain Assessment: Faces Faces Pain Scale: Hurts even more Pain Location: R LE Pain Descriptors / Indicators: Discomfort, Grimacing, Guarding,  Sharp Pain Intervention(s): Limited activity within patient's tolerance  Home Living                                          Prior Functioning/Environment              Frequency  Min 2X/week        Progress Toward Goals  OT Goals(current goals can now be found in the care plan section)  Progress towards OT goals: Progressing toward goals  Acute Rehab OT Goals Patient Stated Goal: get better OT Goal Formulation: With patient Time For Goal Achievement: 12/21/23 Potential to Achieve Goals: Good ADL Goals Pt Will Perform Grooming: with modified independence;standing Pt Will Perform Lower Body Dressing: with modified independence;sit to/from stand;with adaptive equipment Pt Will Transfer to Toilet: with modified independence;ambulating Pt Will Perform Toileting - Clothing Manipulation and hygiene: with modified independence;sit to/from stand Pt Will Perform Tub/Shower Transfer: with modified independence;ambulating;rolling walker;Shower transfer;3 in 1  Plan      Co-evaluation                 AM-PAC OT "6 Clicks" Daily Activity     Outcome Measure   Help from another person eating meals?: None Help from another person taking care of personal grooming?: A Little Help from another person toileting, which includes using toliet, bedpan, or urinal?: A Little Help from another person bathing (including washing, rinsing, drying)?: A Little Help from another person to put on and taking off regular upper body clothing?: A Little Help from another person to put on and taking off regular lower body clothing?: A Little 6 Click Score: 19    End of Session Equipment Utilized During Treatment: Gait belt;Rolling walker (2 wheels)  OT Visit Diagnosis: Other abnormalities of gait and mobility (R26.89);Muscle weakness (generalized) (M62.81);Pain;History of falling (Z91.81) Pain - Right/Left: Right Pain - part of body: Leg;Knee   Activity Tolerance Patient  tolerated treatment well   Patient Left in chair;with call bell/phone within reach;with chair alarm set   Nurse Communication Mobility status;Precautions        Time: 1220-1250 OT Time Calculation (min): 30 min  Charges: OT General Charges $OT Visit: 1 Visit  Milburn Aliment, OT/L   Acute OT Clinical Specialist Acute Rehabilitation Services Pager [phone] Office [phone]   [facility] 12/09/2023, 1:09 PM

## 2023-12-09 NOTE — Progress Notes (Signed)
 Per CM pt  will  be discharge with [facility], and will inform this writer when she's ready at her end.

## 2023-12-10 ENCOUNTER — Telehealth: Payer: Self-pay

## 2023-12-10 NOTE — Transitions of Care (Post Inpatient/ED Visit) (Signed)
 12/10/2023  Name: Renee Watts MRN: [id number] DOB: September 24, 1950  Today's TOC FU Call Status: Today's TOC FU Call Status:: Successful TOC FU Call Completed TOC FU Call Complete Date: 12/10/23 Patient's Name and Date of Birth confirmed.  Transition Care Management Follow-up Telephone Call Date of Discharge: 12/09/23 Discharge Facility: [facility]) Type of Discharge: Inpatient Admission Primary Inpatient Discharge Diagnosis:: Closed fracture of distal end of right femur, unspecified fracture morphology, initial encounter How have you been since you were released from the hospital?: Better (Reports she was able to get up dress without help today.) Any questions or concerns?: No  Items Reviewed: Did you receive and understand the discharge instructions provided?: Yes Medications obtained,verified, and reconciled?: Yes (Medications Reviewed) Any new allergies since your discharge?: No Dietary orders reviewed?: Yes Type of Diet Ordered:: low salt heart healthy diet Do you have support at home?: Yes People in Home [RPT]: child(ren), adult, spouse  Medications Reviewed Today: Medications Reviewed Today     Reviewed by Earlie Server, RN (Registered Nurse) on 12/10/23 at 1046  Med List Status: <None>   Medication Order Taking? Sig Documenting Provider Last Dose Status Informant  Accu-Chek Softclix Lancets lancets [id number] Yes daily. for testing as directed [provider] Taking Active Self, Pharmacy Records  acetaminophen (TYLENOL) 325 MG tablet [id number] Yes Take 2 tablets (650 mg total) by mouth every 6 (six) hours as needed for mild pain (pain score 1-3), headache or fever. Barnetta Chapel, MD Taking Active   Ascorbic Acid (VITAMIN C) 1000 MG tablet [id number] No Take 1,000 mg by mouth daily with breakfast.  Patient not taking: Reported on 12/10/2023   [provider] Not Taking Active Self, Pharmacy Records  aspirin EC 325 MG tablet [id number] Yes Take 1  tablet (325 mg total) by mouth daily. West Bali, PA-C Taking Active   Blood Glucose Monitoring Suppl (ONETOUCH VERIO IQ SYSTEM) W/DEVICE KIT [id number] Yes by Does not apply route. [provider] Taking Active Self, Pharmacy Records  cholecalciferol (VITAMIN D3) 25 MCG (1000 UNIT) tablet [id number] No Take 1,000 Units by mouth in the morning.  Patient not taking: Reported on 12/10/2023   [provider] Not Taking Active Self, Pharmacy Records  cyanocobalamin (VITAMIN B12) 1000 MCG tablet [id number] No Take 1,000 mcg by mouth daily with breakfast.  Patient not taking: Reported on 12/10/2023   [provider] Not Taking Active Self, Pharmacy Records  feeding supplement (ENSURE ENLIVE / ENSURE PLUS) LIQD [id number] Yes Take 237 mLs by mouth 2 (two) times daily between meals. Barnetta Chapel, MD Taking Active   ferrous gluconate (FERGON) 324 MG tablet [id number] Yes Take 1 tablet (324 mg total) by mouth daily.  Patient taking differently: Take 324 mg by mouth daily with breakfast.   Deeann Saint, MD Taking Active Self, Pharmacy Records  fluticasone [facility]) 50 MCG/ACT nasal spray [id number] Yes Place 1 spray into both nostrils daily.  Patient taking differently: Place 1 spray into both nostrils daily as needed for allergies.   Terressa Koyanagi, DO Taking Active Self, Pharmacy Records  gabapentin (NEURONTIN) 300 MG capsule [id number] Yes TAKE 1 CAPSULE BY MOUTH IN THE MORNING, 1 AT LUNCH, AND 2 AT BEDTIME  Patient taking differently: Take 300-600 mg by mouth See admin instructions. Take 1 capsule by mouth every morning, then 1 capsule by mouth at lunch, and then 2 capsules by mouth everyday at bedtime.   Deeann Saint, MD Taking Active Self, Pharmacy Records  Med Note Silvestre Drum, TYELISHA L   Thu Dec 05, 2023  8:33 PM) Patient was unable to take morning dose on this medication on 12/05/23  HYDROcodone-acetaminophen (NORCO/VICODIN) 5-325 MG tablet [id number]  Yes Take 1-2 tablets by mouth every 6 (six) hours as needed for moderate pain (pain score 4-6) or severe pain (pain score 7-10). Versie Gores, PA-C Taking Active   Lancets [facility] Jewelene Morton PLUS [place]) MISC [id number] Yes USE DAILY AS DIRECTED Viola Greulich, MD Taking Active Self, Pharmacy Records  levocetirizine (XYZAL) 5 MG tablet [id number] Yes Take 5 mg by mouth every evening. [provider] Taking Active Self, Pharmacy Records  lisinopril (ZESTRIL) 20 MG tablet [id number] Yes TAKE 1 TABLET BY MOUTH EVERY DAY  Patient taking differently: Take 20 mg by mouth daily with breakfast.   Viola Greulich, MD Taking Active Self, Pharmacy Records  metFORMIN (GLUCOPHAGE-XR) 750 MG 24 hr tablet [id number] Yes Take 1 tablet (750 mg total) by mouth daily with breakfast.  Patient taking differently: Take 750 mg by mouth in the morning and at bedtime.   Viola Greulich, MD Taking Active Self, Pharmacy Records           Med Note [facility], Myrtle Atta   Thu Dec 05, 2023  8:33 PM) Patient was unable to take morning dose on this medication on 12/05/23  methocarbamol (ROBAXIN) 500 MG tablet [id number] Yes Take 1 tablet (500 mg total) by mouth every 6 (six) hours as needed for muscle spasms. Versie Gores, PA-C Taking Active   Misc. Devices (ROLLATOR [place]) [place] [id number] Yes 1 Device by Does not apply route as directed. Viola Greulich, MD Taking Active Self, Pharmacy Records  Multiple Vitamin (MULTIVITAMIN WITH MINERALS) TABS tablet [id number] Yes Take 1 tablet by mouth daily. Doroteo Gasmen, MD Taking Active   omeprazole (PRILOSEC OTC) 20 MG tablet [id number] Yes Take 20 mg by mouth daily as needed (acid reflux). [provider] Taking Active Self, Pharmacy Records  [facility] VERIO test strip [id number] Yes USE DAILY AS DIRECTED Viola Greulich, MD Taking Active Self, Pharmacy Records  polyvinyl alcohol (LIQUIFILM TEARS) 1.4 % ophthalmic solution [id number] Yes Place 2 drops  into both eyes as needed for dry eyes (As needed for dry eyes). Doroteo Gasmen, MD Taking Active   Potassium Chloride (KLOR-CON PO) [id number] Yes Take 20 mEq by mouth daily. [provider] Taking Active            Med Note (ROSE, Nawaal Alling U   Tue Dec 10, 2023 10:46 AM) Patient reports that she has been taking potassium for years. Added to medication list.   Med List Note Alvie Ax, CPhT 01/01/13 1328):              Home Care and Equipment/Supplies: Were Home Health Services Ordered?: Yes Name of Home Health Agency:: [facility] Has Agency set up a time to come to your home?: No (patient will call them today at her request.) Any new equipment or medical supplies ordered?: No  Functional Questionnaire: Do you need assistance with bathing/showering or dressing?: No Do you need assistance with meal preparation?: No Do you need assistance with eating?: No Do you have difficulty maintaining continence: No Do you need assistance with getting out of bed/getting out of a chair/moving?: Yes (family stands by with walker but patient is able to get up independently) Do you have difficulty managing or taking your medications?: No  Follow up appointments reviewed: PCP Follow-up appointment confirmed?: No (patient  will call and schedule herself) MD Provider Line [phone] Given: No Specialist Hospital Follow-up appointment confirmed?: No Reason Specialist Follow-Up Not Confirmed: Patient has Specialist Provider Number and will Call for Appointment (provided name and address of ortho and patient to call and make an appointment.) Do you need transportation to your follow-up appointment?: No Do you understand care options if your condition(s) worsen?: Yes-patient verbalized understanding  SDOH Interventions Today    Flowsheet Row Most Recent Value  SDOH Interventions   Food Insecurity Interventions Intervention Not Indicated  Housing Interventions Intervention  Not Indicated  Transportation Interventions Intervention Not Indicated  Utilities Interventions Intervention Not Indicated     Spoke with patient who reports that she is doing very well. Reports being able to get up this am to wash and dress.  Reports she only needed help with underclothes.  States she has good family support and denies any concerns today. Provided patient with contact information for ortho and phone number. Reviewed importance of patient calling to schedule follow up with PCP. Reviewed all medications and patient is aware and taking her medications as prescribed.  Patient denies any signs of infection. Reviewed with patient when to call MD .   Reviewed and offered 30 day TOC program and patient declined. Patient was provided my phone number and will call me if needed.   Also, [facility] home [facility] with [facility]. Patient reports that she is planning to call them herself today. Encouraged patient to call me back if any problems with home health and patient agreed. Reviewed fall prevention and encouraged daily exercises.   Orpha Blade, RN, BSN, CEN [employer]- Transition of Care Team.  [facility] [phone]

## 2023-12-12 DIAGNOSIS — M48061 Spinal stenosis, lumbar region without neurogenic claudication: Secondary | ICD-10-CM | POA: Diagnosis not present

## 2023-12-12 DIAGNOSIS — K219 Gastro-esophageal reflux disease without esophagitis: Secondary | ICD-10-CM | POA: Diagnosis not present

## 2023-12-12 DIAGNOSIS — F32A Depression, unspecified: Secondary | ICD-10-CM | POA: Diagnosis not present

## 2023-12-12 DIAGNOSIS — Z4789 Encounter for other orthopedic aftercare: Secondary | ICD-10-CM | POA: Diagnosis not present

## 2023-12-12 DIAGNOSIS — Z556 Problems related to health literacy: Secondary | ICD-10-CM | POA: Diagnosis not present

## 2023-12-12 DIAGNOSIS — Z981 Arthrodesis status: Secondary | ICD-10-CM | POA: Diagnosis not present

## 2023-12-12 DIAGNOSIS — D649 Anemia, unspecified: Secondary | ICD-10-CM | POA: Diagnosis not present

## 2023-12-12 DIAGNOSIS — M9701XA Periprosthetic fracture around internal prosthetic right hip joint, initial encounter: Secondary | ICD-10-CM | POA: Diagnosis not present

## 2023-12-12 DIAGNOSIS — M5416 Radiculopathy, lumbar region: Secondary | ICD-10-CM | POA: Diagnosis not present

## 2023-12-12 DIAGNOSIS — I10 Essential (primary) hypertension: Secondary | ICD-10-CM | POA: Diagnosis not present

## 2023-12-12 DIAGNOSIS — M9711XD Periprosthetic fracture around internal prosthetic right knee joint, subsequent encounter: Secondary | ICD-10-CM | POA: Diagnosis not present

## 2023-12-12 DIAGNOSIS — Z7984 Long term (current) use of oral hypoglycemic drugs: Secondary | ICD-10-CM | POA: Diagnosis not present

## 2023-12-12 DIAGNOSIS — E119 Type 2 diabetes mellitus without complications: Secondary | ICD-10-CM | POA: Diagnosis not present

## 2023-12-12 DIAGNOSIS — Z7982 Long term (current) use of aspirin: Secondary | ICD-10-CM | POA: Diagnosis not present

## 2023-12-12 DIAGNOSIS — M4316 Spondylolisthesis, lumbar region: Secondary | ICD-10-CM | POA: Diagnosis not present

## 2023-12-12 DIAGNOSIS — S72451D Displaced supracondylar fracture without intracondylar extension of lower end of right femur, subsequent encounter for closed fracture with routine healing: Secondary | ICD-10-CM | POA: Diagnosis not present

## 2023-12-14 ENCOUNTER — Other Ambulatory Visit: Payer: Self-pay | Admitting: Orthopaedic Surgery

## 2023-12-16 ENCOUNTER — Encounter: Admitting: Orthopedic Surgery

## 2023-12-22 ENCOUNTER — Other Ambulatory Visit: Payer: Self-pay | Admitting: Family Medicine

## 2023-12-22 DIAGNOSIS — J302 Other seasonal allergic rhinitis: Secondary | ICD-10-CM

## 2023-12-24 DIAGNOSIS — S72461D Displaced supracondylar fracture with intracondylar extension of lower end of right femur, subsequent encounter for closed fracture with routine healing: Secondary | ICD-10-CM | POA: Diagnosis not present

## 2023-12-25 ENCOUNTER — Encounter: Payer: Self-pay | Admitting: Family Medicine

## 2023-12-25 ENCOUNTER — Ambulatory Visit: Admitting: Family Medicine

## 2023-12-25 VITALS — BP 126/72 | HR 74 | Temp 98.6°F | Ht 65.0 in | Wt 153.8 lb

## 2023-12-25 DIAGNOSIS — S72401D Unspecified fracture of lower end of right femur, subsequent encounter for closed fracture with routine healing: Secondary | ICD-10-CM

## 2023-12-25 DIAGNOSIS — F4323 Adjustment disorder with mixed anxiety and depressed mood: Secondary | ICD-10-CM

## 2023-12-25 DIAGNOSIS — E1149 Type 2 diabetes mellitus with other diabetic neurological complication: Secondary | ICD-10-CM

## 2023-12-25 DIAGNOSIS — E559 Vitamin D deficiency, unspecified: Secondary | ICD-10-CM | POA: Diagnosis not present

## 2023-12-25 DIAGNOSIS — Z7984 Long term (current) use of oral hypoglycemic drugs: Secondary | ICD-10-CM

## 2023-12-25 DIAGNOSIS — I10 Essential (primary) hypertension: Secondary | ICD-10-CM | POA: Diagnosis not present

## 2023-12-25 MED ORDER — BLOOD GLUCOSE MONITORING SUPPL DEVI: 1.0000 | Freq: Three times a day (TID) | 0 refills | Status: AC

## 2023-12-25 MED ORDER — SERTRALINE HCL 25 MG PO TABS: 25.0000 mg | ORAL_TABLET | Freq: Every day | ORAL | 3 refills | Status: DC

## 2023-12-25 MED ORDER — EMPAGLIFLOZIN 10 MG PO TABS: 10.0000 mg | ORAL_TABLET | Freq: Every day | ORAL | 11 refills | Status: AC

## 2023-12-25 MED ORDER — LANCETS MISC. MISC: 1.0000 | Freq: Three times a day (TID) | 11 refills | Status: AC

## 2023-12-25 MED ORDER — BLOOD GLUCOSE TEST VI STRP: 1.0000 | ORAL_STRIP | Freq: Three times a day (TID) | 11 refills | Status: AC

## 2023-12-25 MED ORDER — LANCET DEVICE MISC: 1.0000 | Freq: Three times a day (TID) | 0 refills | Status: AC

## 2023-12-25 NOTE — Progress Notes (Signed)
 Established Patient Office Visit   Subjective  Patient ID: Renee Watts, female    DOB: [birth year]  Age: 73 y.o. MRN: [id number]  Chief Complaint  Patient presents with   Hospitalization Follow-up    Right femur fracture     Patient is a 73 year old female seen for HFU.  Patient hospitalized 4/10-4/14/2025 for closed fracture of distal end of right femur status post  ORIF.  Had follow-up with Ortho this week.  Patient ambulating with aid of walker.  Has home health PT. pt notes increased anxiety since discharge.  Worried about why this happened, if she will fall again, waking up around 2 AM thinking about things.  Has not wanted to be bothered.  Stayed in bed all day over the weekend.  Patient states knee/leg feels stable and back feels good.  Drink prune juice for constipation the first few days after discharge.  Appetite somewhat decreased.  Eats less if drinks a boost or Ensure supplemental drink.  Patient requesting new glucometer as unable to find hers.    Patient Active Problem List   Diagnosis Date Noted   Closed fracture of distal end of right femur, unspecified fracture morphology, initial encounter (HCC) 12/05/2023   Lumbar radiculopathy 09/17/2023   Osteopenia 02/15/2021   Venous stasis of both lower extremities 06/27/2020   Bilateral lower extremity edema 02/05/2020   Recurrent sinusitis 09/03/2018   Vaginal vault prolapse after hysterectomy 02/13/2017   Obesity 01/06/2016   Low back pain 01/06/2016   Normocytic anemia 03/12/2013   Diabetic peripheral neuropathy (HCC) 09/01/2012   Hyperlipemia 09/16/2006   Hypertension associated with diabetes (HCC) 09/16/2006   GERD 09/16/2006   Type II diabetes mellitus with neurological manifestations (HCC) 06/17/2006   Osteoarthritis 06/17/2006   DEPENDENT EDEMA, LEGS, BILATERAL 06/17/2006   Past Medical History:  Diagnosis Date   Allergy    Benign paroxysmal positional vertigo 06/25/2013   dx with prior PCP, s/p labs,  mri and vestibular rehab; nasal spray for etd seemed to help   Blood in stool 03/11/2012   Dependent edema    Bilateral   Depression    GERD (gastroesophageal reflux disease)    on meds   Hyperlipidemia    on meds   Low back pain    sciatica; sees orthopedic specialist ([facility])for this and shoulder tendinitis   Obesity, diabetes, and hypertension syndrome (HCC)    on med   Osteoarthritis, knee    Bilateral, s/p 3 total knee replacement surgerues on right, last 2005. Dr. Leighton Punches   Pellegrini-Steida syndrome    Myositis ossificans   Seasonal allergies    Shingles 07/10/2012   LESIONS CLEARED BUT NOT TOLERATING ANY THING TIGHT AGAINST BODY--SHINGLES WERE AROSS BACK AND ABDOMEN   Past Surgical History:  Procedure Laterality Date   ABDOMINAL HYSTERECTOMY  1984   BLADDER SURGERY  03/2017   bladder tac per pt   COLONOSCOPY  2020   MS-MAC-goly (good)-hems/TAx5)   left shoulder steroid injection     ORIF FEMUR FRACTURE Right 12/06/2023   Procedure: OPEN REDUCTION INTERNAL FIXATION (ORIF) DISTAL FEMUR FRACTURE;  Surgeon: Laneta Pintos, MD;  Location: [facility] OR;  Service: Orthopedics;  Laterality: Right;   POLYPECTOMY  2020   TA x 5   POSTERIOR FUSION PEDICLE SCREW PLACEMENT N/A 09/17/2023   Procedure: LUMBAR THREE-FOUR LAMINECTOMY AND POSTERIOR INSTRUMENTAL SPINAL FUSION;  Surgeon: Diedra Fowler, MD;  Location: [facility] OR;  Service: Orthopedics;  Laterality: N/A;   TONSILLECTOMY  1977   TOTAL  HIP ARTHROPLASTY Right 10/10/2012   Procedure: R TOTAL HIP ARTHROPLASTY ANTERIOR APPROACH;  Surgeon: Arnie Lao, MD;  Location: [facility] ORS;  Service: Orthopedics;  Laterality: Right;  Right Total Hip Arthroplasty, Anterior Approach (C-Arm)   TOTAL KNEE ARTHROPLASTY Right    with multiple revisions, 2003, 2005   TOTAL KNEE ARTHROPLASTY Left 01/09/2013   Procedure: LEFT TOTAL KNEE ARTHROPLASTY;  Surgeon: Arnie Lao, MD;  Location: [facility] ORS;  Service: Orthopedics;  Laterality:  Left;   TUBAL LIGATION  1978   WISDOM TOOTH EXTRACTION  2013,2014   Social History   Tobacco Use   Smoking status: Never   Smokeless tobacco: Never  Vaping Use   Vaping status: Never Used  Substance Use Topics   Alcohol  use: No   Drug use: No   Family History  Problem Relation Age of Onset   Diabetes Mother    Hypertension Mother    Hypertension Father    Cancer Father        Lung CA   Coronary artery disease Father    Colon cancer Neg Hx    Colon polyps Neg Hx    Esophageal cancer Neg Hx    Rectal cancer Neg Hx    Stomach cancer Neg Hx    No Known Allergies    ROS Negative unless stated above    Objective:     BP 126/72 (BP Location: Left Arm, Patient Position: Sitting, Cuff Size: Normal)   Pulse 74   Temp 98.6 F (37 C) (Oral)   Ht 5\' 5"  (1.651 m)   Wt 153 lb 12.8 oz (69.8 kg)   SpO2 98%   BMI 25.59 kg/m  BP Readings from Last 3 Encounters:  12/25/23 126/72  12/09/23 (!) 149/64  11/27/23 (!) 140/74   Wt Readings from Last 3 Encounters:  12/25/23 153 lb 12.8 oz (69.8 kg)  12/06/23 (P) 168 lb (76.2 kg)  11/27/23 158 lb 6.4 oz (71.8 kg)      Physical Exam Constitutional:      General: She is not in acute distress.    Appearance: Normal appearance.  HENT:     Head: Normocephalic and atraumatic.     Nose: Nose normal.     Mouth/Throat:     Mouth: Mucous membranes are moist.  Cardiovascular:     Rate and Rhythm: Normal rate and regular rhythm.     Heart sounds: Normal heart sounds. No murmur heard.    No gallop.  Pulmonary:     Effort: Pulmonary effort is normal. No respiratory distress.     Breath sounds: Normal breath sounds. No wheezing, rhonchi or rales.  Skin:    General: Skin is warm and dry.     Comments: Healing surgical incision of right lateral knee with smaller incisions on right lateral thigh.  Faint ecchymosis of thigh and left AC fossa  Neurological:     Mental Status: She is alert and oriented to person, place, and time.      Comments: Ambulating with walker.      12/25/2023   11:47 AM 11/27/2023    3:23 PM 07/29/2023    8:32 AM  Depression screen PHQ 2/9  Decreased Interest 1 0 2  Down, Depressed, Hopeless 1 0 0  PHQ - 2 Score 2 0 2  Altered sleeping 1 0 1  Tired, decreased energy 1 2 2   Change in appetite 1 2 2   Feeling bad or failure about yourself  0 2 2  Trouble concentrating 1  0 0  Moving slowly or fidgety/restless 0 0 0  Suicidal thoughts 0 0 0  PHQ-9 Score 6 6 9       12/25/2023   11:47 AM 11/27/2023    3:24 PM 07/29/2023    8:32 AM 11/07/2022    8:15 AM  GAD 7 : Generalized Anxiety Score  Nervous, Anxious, on Edge 1 0 0 0  Control/stop worrying 1 0 1 0  Worry too much - different things 1 0 0 0  Trouble relaxing 1 2 0 1  Restless 1 0 0 0  Easily annoyed or irritable 1 0 0 0  Afraid - awful might happen 1 0 0 0  Total GAD 7 Score 7 2 1 1      No results found for any visits on 12/25/23.    Assessment & Plan:  Adjustment disorder with mixed anxiety and depressed mood -     Sertraline HCl; Take 1 tablet (25 mg total) by mouth daily.  Dispense: 30 tablet; Refill: 3  Closed fracture of distal end of right femur with routine healing, unspecified fracture morphology, subsequent encounter  Type II diabetes mellitus with neurological manifestations (HCC) -     Blood Glucose Monitoring Suppl; 1 each by Does not apply route in the morning, at noon, and at bedtime. May substitute to any manufacturer covered by patient's insurance.  Dispense: 1 each; Refill: 0 -     Blood Glucose Test; 1 each by In Vitro route in the morning, at noon, and at bedtime. May substitute to any manufacturer covered by patient's insurance.  Dispense: 100 strip; Refill: 11 -     Lancet Device; 1 each by Does not apply route in the morning, at noon, and at bedtime. May substitute to any manufacturer covered by patient's insurance.  Dispense: 1 each; Refill: 0 -     Lancets Misc.; 1 each by Does not apply route in the morning,  at noon, and at bedtime. May substitute to any manufacturer covered by patient's insurance.  Dispense: 100 each; Refill: 11 -     Empagliflozin; Take 1 tablet (10 mg total) by mouth daily before breakfast.  Dispense: 30 tablet; Refill: 11  Essential hypertension  Vitamin D  deficiency  Seen for hospital follow-up.  Stable status post ORIF for closed fracture of distal femur.  Continue PT uses walker.  Continue vitamin D  supplement and follow-up with Ortho.  Increased anxiety/depression symptoms since discharge.  PHQ-9 score 6, GAD-7 score 7 this visit.  Pt wishes to start medication.  Zoloft 25 mg sent to pharmacy.  A1c 6.5% on 09/11/2023.  Elevated blood sugars during hospitalization.  Discussed discontinuing metformin  XR 750 and starting Jardiance or Farxiga for better renal protection given elevated microalbumin creatinine ratio of 469.8 on 11/27/2023.  BP well-controlled.  Continue current medications.  Return in about 6 weeks (around 02/05/2024).   Viola Greulich, MD

## 2024-01-08 ENCOUNTER — Other Ambulatory Visit (INDEPENDENT_AMBULATORY_CARE_PROVIDER_SITE_OTHER): Payer: Self-pay

## 2024-01-08 ENCOUNTER — Ambulatory Visit (INDEPENDENT_AMBULATORY_CARE_PROVIDER_SITE_OTHER): Admitting: Orthopedic Surgery

## 2024-01-08 DIAGNOSIS — Z981 Arthrodesis status: Secondary | ICD-10-CM

## 2024-01-08 NOTE — Progress Notes (Signed)
 Orthopedic Surgery Post-operative Office Visit   Procedure: L3/4 laminectomy and PSIF Date of Surgery: 09/17/2023 (~4 months post-op)   Assessment: Patient is a 73 y.o. who has recovered well from her lumbar spine surgery, but had a fall and broke her right femur.  She underwent ORIF with Dr. Curtiss Dowdy on 12/06/2023 so she is still recovering from that     Plan: -Operative plans complete -No spine specific precautions at this time -Okay to submerge wound at this time -Should continue to work with therapy to rehab from her periprosthetic femur fracture -Return to office in 2 months, x-rays needed at next visit: AP/lateral/flex/ex lumbar   ___________________________________________________________________________     Subjective: Patient has been doing well since she was last seen in the office.  She has noted decreasing pain.  She is using less pain medication.  Her radiating leg pain has improved significantly.  She still has some moderate back pain that she notes when she is up and moving.  However, she feels that this has gotten better as well since our last office visit.  She has been working with physical therapy and doing home exercises to work on her leg strength.   Objective:   General: no acute distress, appropriate affect Neurologic: alert, answering questions appropriately, following commands Respiratory: unlabored breathing on room air Skin: incision is well healed   MSK (spine):   -Strength exam                                                   Left                  Right   EHL                              4/5                  3/5 TA                                 4/5                  3/5 GSC                             5/5                  4/5 Knee extension            5/5                  5/5 Hip flexion                    5/5                  4/5   -Sensory exam                           Sensation intact to light touch in L2-S1 nerve distributions of bilateral  lower extremities   Imaging: X-rays of the lumbar spine from 01/08/2024 were independently reviewed and interpreted, showing posterior instrumentation at L3 and L4.  No lucency  seen around the screws.  None of the screws have backed out.  Laminectomy defect at L3/4.  Spondylolisthesis seen at L3/4 that does not translate between flexion and extension views.  No fracture or dislocation seen.     Patient name: Renee Watts Patient MRN: [id number] Date of visit: 01/08/24

## 2024-01-09 ENCOUNTER — Encounter (HOSPITAL_COMMUNITY): Payer: Self-pay | Admitting: Orthopedic Surgery

## 2024-01-21 DIAGNOSIS — S72461D Displaced supracondylar fracture with intracondylar extension of lower end of right femur, subsequent encounter for closed fracture with routine healing: Secondary | ICD-10-CM | POA: Diagnosis not present

## 2024-02-08 ENCOUNTER — Other Ambulatory Visit: Payer: Self-pay | Admitting: Orthopaedic Surgery

## 2024-02-18 ENCOUNTER — Other Ambulatory Visit: Payer: Self-pay | Admitting: Family Medicine

## 2024-02-18 DIAGNOSIS — M79651 Pain in right thigh: Secondary | ICD-10-CM

## 2024-02-18 DIAGNOSIS — E1142 Type 2 diabetes mellitus with diabetic polyneuropathy: Secondary | ICD-10-CM

## 2024-02-18 DIAGNOSIS — S72461D Displaced supracondylar fracture with intracondylar extension of lower end of right femur, subsequent encounter for closed fracture with routine healing: Secondary | ICD-10-CM | POA: Diagnosis not present

## 2024-02-19 DIAGNOSIS — E119 Type 2 diabetes mellitus without complications: Secondary | ICD-10-CM | POA: Diagnosis not present

## 2024-02-19 DIAGNOSIS — H25813 Combined forms of age-related cataract, bilateral: Secondary | ICD-10-CM | POA: Diagnosis not present

## 2024-02-19 DIAGNOSIS — H02535 Eyelid retraction left lower eyelid: Secondary | ICD-10-CM | POA: Diagnosis not present

## 2024-02-19 DIAGNOSIS — H02532 Eyelid retraction right lower eyelid: Secondary | ICD-10-CM | POA: Diagnosis not present

## 2024-02-19 DIAGNOSIS — M9711XA Periprosthetic fracture around internal prosthetic right knee joint, initial encounter: Secondary | ICD-10-CM | POA: Diagnosis not present

## 2024-02-19 LAB — HM DIABETES EYE EXAM

## 2024-02-26 ENCOUNTER — Ambulatory Visit: Admitting: Family Medicine

## 2024-03-04 ENCOUNTER — Ambulatory Visit (INDEPENDENT_AMBULATORY_CARE_PROVIDER_SITE_OTHER): Admitting: Family Medicine

## 2024-03-04 ENCOUNTER — Encounter: Payer: Self-pay | Admitting: Family Medicine

## 2024-03-04 VITALS — BP 108/72 | HR 65 | Temp 98.7°F | Ht 65.0 in | Wt 153.4 lb

## 2024-03-04 DIAGNOSIS — E559 Vitamin D deficiency, unspecified: Secondary | ICD-10-CM | POA: Diagnosis not present

## 2024-03-04 DIAGNOSIS — F4323 Adjustment disorder with mixed anxiety and depressed mood: Secondary | ICD-10-CM

## 2024-03-04 DIAGNOSIS — Z7984 Long term (current) use of oral hypoglycemic drugs: Secondary | ICD-10-CM

## 2024-03-04 DIAGNOSIS — E782 Mixed hyperlipidemia: Secondary | ICD-10-CM

## 2024-03-04 DIAGNOSIS — I10 Essential (primary) hypertension: Secondary | ICD-10-CM

## 2024-03-04 DIAGNOSIS — D509 Iron deficiency anemia, unspecified: Secondary | ICD-10-CM

## 2024-03-04 DIAGNOSIS — S72401D Unspecified fracture of lower end of right femur, subsequent encounter for closed fracture with routine healing: Secondary | ICD-10-CM

## 2024-03-04 DIAGNOSIS — E1149 Type 2 diabetes mellitus with other diabetic neurological complication: Secondary | ICD-10-CM | POA: Diagnosis not present

## 2024-03-04 LAB — CBC WITH DIFFERENTIAL/PLATELET
Basophils Absolute: 0.1 K/uL (ref 0.0–0.1)
Basophils Relative: 0.7 % (ref 0.0–3.0)
Eosinophils Absolute: 0.4 K/uL (ref 0.0–0.7)
Eosinophils Relative: 6 % — ABNORMAL HIGH (ref 0.0–5.0)
HCT: 38.8 % (ref 36.0–46.0)
Hemoglobin: 13 g/dL (ref 12.0–15.0)
Lymphocytes Relative: 26.7 % (ref 12.0–46.0)
Lymphs Abs: 1.8 K/uL (ref 0.7–4.0)
MCHC: 33.4 g/dL (ref 30.0–36.0)
MCV: 90.8 fl (ref 78.0–100.0)
Monocytes Absolute: 0.5 K/uL (ref 0.1–1.0)
Monocytes Relative: 7.4 % (ref 3.0–12.0)
Neutro Abs: 4 K/uL (ref 1.4–7.7)
Neutrophils Relative %: 59.2 % (ref 43.0–77.0)
Platelets: 176 K/uL (ref 150.0–400.0)
RBC: 4.27 Mil/uL (ref 3.87–5.11)
RDW: 14.6 % (ref 11.5–15.5)
WBC: 6.7 K/uL (ref 4.0–10.5)

## 2024-03-04 LAB — LIPID PANEL
Cholesterol: 183 mg/dL (ref 0–200)
HDL: 44.3 mg/dL (ref >39.00)
LDL Cholesterol: 118 mg/dL — ABNORMAL HIGH (ref 0–99)
NonHDL: 138.46
Total CHOL/HDL Ratio: 4
Triglycerides: 102 mg/dL (ref 0.0–149.0)
VLDL: 20.4 mg/dL (ref 0.0–40.0)

## 2024-03-04 LAB — VITAMIN D 25 HYDROXY (VIT D DEFICIENCY, FRACTURES): VITD: 27.53 ng/mL — ABNORMAL LOW (ref 30.00–100.00)

## 2024-03-04 NOTE — Progress Notes (Unsigned)
 Established Patient Office Visit   Subjective  Patient ID: Renee Watts, female    DOB: 07/18/1951  Age: 73 y.o. MRN: [id number]  Chief Complaint  Patient presents with  . Medical Management of Chronic Issues    2 month follow-up for Diabetes, Anxiety, Blood Pressure, A1c Check and Lipid panel Due   Pt accompanied by her granddaughter.  Patient is a 73 year old female seen for follow-up on chronic concerns.  Patient endorses twitches or spasms in upper back.  Skin feels thin/itchy.  Was on pain medication status post femur fracture but stopped pt having a hard time taking it easy/not being able to get up and do what she wants.  Family has been helpful/supportive.  Patient states it was affecting her mood.  Had plans for an upcoming birthday party.  Pt had lumbar surgery January 2025 then femur fracture in April 2025.  Inquires if needs to continue taking potassium supplement.    Patient Active Problem List   Diagnosis Date Noted  . Closed fracture of distal end of right femur, unspecified fracture morphology, initial encounter (HCC) 12/05/2023  . Lumbar radiculopathy 09/17/2023  . Osteopenia 02/15/2021  . Venous stasis of both lower extremities 06/27/2020  . Bilateral lower extremity edema 02/05/2020  . Recurrent sinusitis 09/03/2018  . Vaginal vault prolapse after hysterectomy 02/13/2017  . Obesity 01/06/2016  . Low back pain 01/06/2016  . Normocytic anemia 03/12/2013  . Diabetic peripheral neuropathy (HCC) 09/01/2012  . Hyperlipemia 09/16/2006  . Hypertension associated with diabetes (HCC) 09/16/2006  . GERD 09/16/2006  . Type II diabetes mellitus with neurological manifestations (HCC) 06/17/2006  . Osteoarthritis 06/17/2006  . DEPENDENT EDEMA, LEGS, BILATERAL 06/17/2006   Past Medical History:  Diagnosis Date  . Allergy   . Benign paroxysmal positional vertigo 06/25/2013   dx with prior PCP, s/p labs, mri and vestibular rehab; nasal spray for etd seemed to help  .  Blood in stool 03/11/2012  . Dependent edema    Bilateral  . Depression   . GERD (gastroesophageal reflux disease)    on meds  . Hyperlipidemia    on meds  . Low back pain    sciatica; sees orthopedic specialist ([facility])for this and shoulder tendinitis  . Obesity, diabetes, and hypertension syndrome (HCC)    on med  . Osteoarthritis, knee    Bilateral, s/p 3 total knee replacement surgerues on right, last 2005. Dr. Duwayne  . Pellegrini-Steida syndrome    Myositis ossificans  . Seasonal allergies   . Shingles 07/10/2012   LESIONS CLEARED BUT NOT TOLERATING ANY THING TIGHT AGAINST BODY--SHINGLES WERE AROSS BACK AND ABDOMEN   Past Surgical History:  Procedure Laterality Date  . ABDOMINAL HYSTERECTOMY  1984  . BLADDER SURGERY  03/2017   bladder tac per pt  . COLONOSCOPY  2020   MS-MAC-goly (good)-hems/TAx5)  . left shoulder steroid injection    . ORIF FEMUR FRACTURE Right 12/06/2023   Procedure: OPEN REDUCTION INTERNAL FIXATION (ORIF) DISTAL FEMUR FRACTURE;  Surgeon: Kendal Franky SQUIBB, MD;  Location: [facility] OR;  Service: Orthopedics;  Laterality: Right;  . POLYPECTOMY  2020   TA x 5  . TONSILLECTOMY  1977  . TOTAL HIP ARTHROPLASTY Right 10/10/2012   Procedure: R TOTAL HIP ARTHROPLASTY ANTERIOR APPROACH;  Surgeon: Lonni CINDERELLA Poli, MD;  Location: [facility];  Service: Orthopedics;  Laterality: Right;  Right Total Hip Arthroplasty, Anterior Approach (C-Arm)  . TOTAL KNEE ARTHROPLASTY Right    with multiple revisions, 2003, 2005  . TOTAL  KNEE ARTHROPLASTY Left 01/09/2013   Procedure: LEFT TOTAL KNEE ARTHROPLASTY;  Surgeon: Lonni CINDERELLA Poli, MD;  Location: [facility];  Service: Orthopedics;  Laterality: Left;  . TUBAL LIGATION  1978  . WISDOM TOOTH EXTRACTION  2013,2014   Social History   Tobacco Use  . Smoking status: Never  . Smokeless tobacco: Never  Vaping Use  . Vaping status: Never Used  Substance Use Topics  . Alcohol  use: No  . Drug use: No   Family History   Problem Relation Age of Onset  . Diabetes Mother   . Hypertension Mother   . Hypertension Father   . Cancer Father        Lung CA  . Coronary artery disease Father   . Colon cancer Neg Hx   . Colon polyps Neg Hx   . Esophageal cancer Neg Hx   . Rectal cancer Neg Hx   . Stomach cancer Neg Hx    No Known Allergies  ROS Negative unless stated above    Objective:     BP 108/72 (BP Location: Left Arm, Patient Position: Sitting, Cuff Size: Normal)   Pulse 65   Temp 98.7 F (37.1 C) (Oral)   Ht 5' 5 (1.651 m)   Wt 153 lb 6.4 oz (69.6 kg)   SpO2 96%   BMI 25.53 kg/m  BP Readings from Last 3 Encounters:  03/04/24 108/72  12/25/23 126/72  12/09/23 (!) 149/64   Wt Readings from Last 3 Encounters:  03/04/24 153 lb 6.4 oz (69.6 kg)  12/25/23 153 lb 12.8 oz (69.8 kg)  12/06/23 (P) 168 lb (76.2 kg)      Physical Exam Constitutional:      General: She is not in acute distress.    Appearance: Normal appearance.  HENT:     Head: Normocephalic and atraumatic.     Nose: Nose normal.     Mouth/Throat:     Mouth: Mucous membranes are moist.  Cardiovascular:     Rate and Rhythm: Normal rate and regular rhythm.     Heart sounds: Normal heart sounds. No murmur heard.    No gallop.  Pulmonary:     Effort: Pulmonary effort is normal. No respiratory distress.     Breath sounds: Normal breath sounds. No wheezing, rhonchi or rales.  Musculoskeletal:     Comments: Healing surgical incision right knee.  Skin:    General: Skin is warm and dry.  Neurological:     Mental Status: She is alert and oriented to person, place, and time.        03/04/2024   10:33 AM 12/25/2023   11:47 AM 11/27/2023    3:23 PM  Depression screen PHQ 2/9  Decreased Interest 2 1 0  Down, Depressed, Hopeless 2 1 0  PHQ - 2 Score 4 2 0  Altered sleeping 2 1 0  Tired, decreased energy 2 1 2   Change in appetite 1 1 2   Feeling bad or failure about yourself  1 0 2  Trouble concentrating 1 1 0  Moving  slowly or fidgety/restless 0 0 0  Suicidal thoughts 0 0 0  PHQ-9 Score 11 6 6       03/04/2024   10:33 AM 12/25/2023   11:47 AM 11/27/2023    3:24 PM 07/29/2023    8:32 AM  GAD 7 : Generalized Anxiety Score  Nervous, Anxious, on Edge 1 1 0 0  Control/stop worrying 1 1 0 1  Worry too much - different things 1  1 0 0  Trouble relaxing 1 1 2  0  Restless 0 1 0 0  Easily annoyed or irritable 1 1 0 0  Afraid - awful might happen 2 1 0 0  Total GAD 7 Score 7 7 2 1      No results found for any visits on 03/04/24.    Assessment & Plan:   Type II diabetes mellitus with neurological manifestations (HCC) -     POCT glycosylated hemoglobin (Hb A1C)  Vitamin D  deficiency -     VITAMIN D  25 Hydroxy (Vit-D Deficiency, Fractures); Future  Iron deficiency anemia, unspecified iron deficiency anemia type -     CBC with Differential/Platelet; Future -     Iron, TIBC and Ferritin Panel; Future  Essential hypertension  Mixed hyperlipidemia -     Lipid panel; Future  Adjustment disorder with mixed anxiety and depressed mood  Closed fracture of distal end of right femur with routine healing, unspecified fracture morphology, subsequent encounter    Return in about 3 months (around 06/04/2024).   Clotilda JONELLE Single, MD

## 2024-03-05 LAB — IRON,TIBC AND FERRITIN PANEL
%SAT: 16 % (ref 16–45)
Ferritin: 68 ng/mL (ref 16–288)
Iron: 57 ug/dL (ref 45–160)
TIBC: 360 ug/dL (ref 250–450)

## 2024-03-06 ENCOUNTER — Ambulatory Visit: Payer: Self-pay | Admitting: Family Medicine

## 2024-03-06 DIAGNOSIS — E559 Vitamin D deficiency, unspecified: Secondary | ICD-10-CM

## 2024-03-06 MED ORDER — VITAMIN D (ERGOCALCIFEROL) 1.25 MG (50000 UNIT) PO CAPS: 50000.0000 [IU] | ORAL_CAPSULE | ORAL | 0 refills | Status: DC

## 2024-03-09 ENCOUNTER — Other Ambulatory Visit (INDEPENDENT_AMBULATORY_CARE_PROVIDER_SITE_OTHER): Payer: Self-pay

## 2024-03-09 ENCOUNTER — Ambulatory Visit: Admitting: Orthopedic Surgery

## 2024-03-09 DIAGNOSIS — Z981 Arthrodesis status: Secondary | ICD-10-CM

## 2024-03-09 NOTE — Progress Notes (Addendum)
 Orthopedic Surgery Post-operative Office Visit   Procedure: L3/4 laminectomy and PSIF Date of Surgery: 09/17/2023 (~6 months post-op)   Assessment: Patient is a 73 y.o. who is well after surgery.  She is not having any back pain and her radiating leg pain has resolved.  Has some pain with activity around her right distal thigh where she sustained a femur fracture about 3 months ago     Plan: -Activity as tolerated, no spine specific precautions -Continue to work on Restaurant manager, fast food given by PT -Return to office in 6 months, x-rays needed at next visit: AP/lateral/flex/ex lumbar   ___________________________________________________________________________     Subjective: Patient has been doing better since she was last seen in the office.  She said she is having less thigh pain on the right side.  She still has some pain with activity on that side.  It has improved since she was last seen in the office and continues to get better as she recovers from that fracture. She is not having any of the same radiating leg pain that she had prior to her lumbar spine surgery.  She is ambulating with a walker.  She is pleased with the outcome of her lumbar spine surgery at this point.   Objective:   General: no acute distress, appropriate affect Neurologic: alert, answering questions appropriately, following commands Respiratory: unlabored breathing on room air Skin: incision is well healed   MSK (spine):   -Strength exam                                                   Left                  Right   EHL                              4/5                  3/5 TA                                 4/5                  3/5 GSC                             5/5                  4/5 Knee extension            5/5                  4/5 Hip flexion                    5/5                  4/5  -Sensory exam                           Sensation intact to light touch in L2-S1 nerve distributions  of bilateral lower extremities   Imaging: X-rays of the lumbar spine from 03/09/2024 were independently reviewed and interpreted, showing  laminectomy defect at L3/4.  Spondylolisthesis at L3/4 that does not shift between flexion and extension views.  Posterior instrumentation at L3 and L4.  No lucency seen around the screws and none of them have backed out.  No fracture or dislocation seen.     Patient name: Renee Watts Patient MRN: [id number] Date of visit: 03/09/24  Pre-operative Scores   ODI: 66% VAS leg: 10 VAS back: 10  30-Month Scores  ODI: 22% VAS leg: 6/10 VAS back: 2/10

## 2024-03-13 ENCOUNTER — Telehealth: Payer: Self-pay | Admitting: Family Medicine

## 2024-03-13 DIAGNOSIS — E559 Vitamin D deficiency, unspecified: Secondary | ICD-10-CM

## 2024-03-13 NOTE — Telephone Encounter (Unsigned)
 Copied from CRM [id number]. Topic: Clinical - Prescription Issue >> Mar 13, 2024  4:06 PM Tiffini S wrote: Reason for CRM: Patient called saying that she is no longer on Metformin  and the pharmacy still refilled the medication. The pharmacy states that they do not have prescription for  Vitamin D , Ergocalciferol , (DRISDOL ) 1.25 MG (50000 UNIT) CAPS capsule   Sent to pharmacy as: Vitamin D , Ergocalciferol , (DRISDOL ) 1.25 MG (50000 UNIT) Cap   E-Prescribing Status: Receipt confirmed by pharmacy (03/06/2024 12:34 PM EDT)   Patient is asking for a call back at [phone]

## 2024-03-16 NOTE — Telephone Encounter (Signed)
 Reach out to pt. Pt updates she got a notification from her pharmacy stating the vitamin D  Rx is ready to be pick up.   Inform her, this cma will contact pharmacy to update them on Metformin . Contacted pharmacy and spoke with a representative. She states she will removed Metformin  from pt current med list.   No further action needed at this time.

## 2024-03-18 LAB — POCT GLYCOSYLATED HEMOGLOBIN (HGB A1C): Hemoglobin A1C: 6.7 % — AB (ref 4.0–5.6)

## 2024-03-18 MED ORDER — SERTRALINE HCL 25 MG PO TABS
25.0000 mg | ORAL_TABLET | Freq: Every day | ORAL | 3 refills | Status: DC
Start: 2024-03-18 — End: 2024-06-27

## 2024-03-19 ENCOUNTER — Other Ambulatory Visit: Payer: Self-pay | Admitting: Family Medicine

## 2024-03-19 DIAGNOSIS — M25471 Effusion, right ankle: Secondary | ICD-10-CM

## 2024-03-20 ENCOUNTER — Ambulatory Visit: Payer: PPO

## 2024-03-20 VITALS — Ht 65.0 in | Wt 153.0 lb

## 2024-03-20 DIAGNOSIS — Z0000 Encounter for general adult medical examination without abnormal findings: Secondary | ICD-10-CM

## 2024-03-20 NOTE — Patient Instructions (Addendum)
 Renee Watts , Thank you for taking time out of your busy schedule to complete your Annual Wellness Visit with me. I enjoyed our conversation and look forward to speaking with you again next year. I, as well as your care team,  appreciate your ongoing commitment to your health goals. Please review the following plan we discussed and let me know if I can assist you in the future. Your Game plan/ To Do List    Referrals: If you haven't heard from the office you've been referred to, please reach out to them at the phone provided.   Follow up Visits: Next Medicare AWV with our clinical staff: 03/26/25 @ 8:40a   Have you seen your provider in the last 6 months (3 months if uncontrolled diabetes)?  Next Office Visit with your provider: 06/04/24 @ 10a  Clinician Recommendations:  Aim for 30 minutes of exercise or brisk walking, 6-8 glasses of water , and 5 servings of fruits and vegetables each day.       This is a list of the screening recommended for you and due dates:  Health Maintenance  Topic Date Due   Zoster (Shingles) Vaccine (1 of 2) Never done   COVID-19 Vaccine (6 - 2024-25 season) 10/29/2023   Flu Shot  03/27/2024   Complete foot exam   04/24/2024   Hemoglobin A1C  09/04/2024   Mammogram  11/05/2024   Yearly kidney health urinalysis for diabetes  11/26/2024   Yearly kidney function blood test for diabetes  12/07/2024   Eye exam for diabetics  02/18/2025   Lipid (cholesterol) test  03/04/2025   Medicare Annual Wellness Visit  03/20/2025   Colon Cancer Screening  05/01/2025   DTaP/Tdap/Td vaccine (3 - Td or Tdap) 08/04/2031   Pneumococcal Vaccine for age over 19  Completed   DEXA scan (bone density measurement)  Completed   Hepatitis C Screening  Completed   Hepatitis B Vaccine  Aged Out   HPV Vaccine  Aged Out   Meningitis B Vaccine  Aged Out   Opioid Pain Medicine Management Opioids are powerful medicines that are used to treat moderate to severe pain. When used for short  periods of time, they can help you to: Sleep better. Do better in physical or occupational therapy. Feel better in the first few days after an injury. Recover from surgery. Opioids should be taken with the supervision of a trained health care provider. They should be taken for the shortest period of time possible. This is because opioids can be addictive, and the longer you take opioids, the greater your risk of addiction. This addiction can also be called opioid use disorder. What are the risks? Using opioid pain medicines for longer than 3 days increases your risk of side effects. Side effects include: Constipation. Nausea and vomiting. Breathing difficulties (respiratory depression). Drowsiness. Confusion. Opioid use disorder. Itching. Taking opioid pain medicine for a long period of time can affect your ability to do daily tasks. It also puts you at risk for: Motor vehicle crashes. Depression. Suicide. Heart attack. Overdose, which can be life-threatening. What is a pain treatment plan? A pain treatment plan is an agreement between you and your health care provider. Pain is unique to each person, and treatments vary depending on your condition. To manage your pain, you and your health care provider need to work together. To help you do this: Discuss the goals of your treatment, including how much pain you might expect to have and how you will manage the  pain. Review the risks and benefits of taking opioid medicines. Remember that a good treatment plan uses more than one approach and minimizes the chance of side effects. Be honest about the amount of medicines you take and about any drug or alcohol  use. Get pain medicine prescriptions from only one health care provider. Pain can be managed with many types of alternative treatments. Ask your health care provider to refer you to one or more specialists who can help you manage pain through: Physical or occupational therapy. Counseling  (cognitive behavioral therapy). Good nutrition. Biofeedback. Massage. Meditation. Non-opioid medicine. Following a gentle exercise program. How to use opioid pain medicine Taking medicine Take your pain medicine exactly as told by your health care provider. Take it only when you need it. If your pain gets less severe, you may take less than your prescribed dose if your health care provider approves. If you are not having pain, do nottake pain medicine unless your health care provider tells you to take it. If your pain is severe, do nottry to treat it yourself by taking more pills than instructed on your prescription. Contact your health care provider for help. Write down the times when you take your pain medicine. It is easy to become confused while on pain medicine. Writing the time can help you avoid overdose. Take other over-the-counter or prescription medicines only as told by your health care provider. Keeping yourself and others safe  While you are taking opioid pain medicine: Do not drive, use machinery, or power tools. Do not sign legal documents. Do not drink alcohol . Do not take sleeping pills. Do not supervise children by yourself. Do not do activities that require climbing or being in high places. Do not go to a lake, river, ocean, spa, or swimming pool. Do not share your pain medicine with anyone. Keep pain medicine in a locked cabinet or in a secure area where pets and children cannot reach it. Stopping your use of opioids If you have been taking opioid medicine for more than a few weeks, you may need to slowly decrease (taper) how much you take until you stop completely. Tapering your use of opioids can decrease your risk of symptoms of withdrawal, such as: Pain and cramping in the abdomen. Nausea. Sweating. Sleepiness. Restlessness. Uncontrollable shaking (tremors). Cravings for the medicine. Do not attempt to taper your use of opioids on your own. Talk with your  health care provider about how to do this. Your health care provider may prescribe a step-down schedule based on how much medicine you are taking and how long you have been taking it. Getting rid of leftover pills Do not save any leftover pills. Get rid of leftover pills safely by: Taking the medicine to a prescription take-back program. This is usually offered by the county or law enforcement. Bringing them to a pharmacy that has a drug disposal container. Flushing them down the toilet. Check the label or package insert of your medicine to see whether this is safe to do. Throwing them out in the trash. Check the label or package insert of your medicine to see whether this is safe to do. If it is safe to throw it out, remove the medicine from the original container, put it into a sealable bag or container, and mix it with used coffee grounds, food scraps, dirt, or cat litter before putting it in the trash. Follow these instructions at home: Activity Do exercises as told by your health care provider. Avoid activities that make  your pain worse. Return to your normal activities as told by your health care provider. Ask your health care provider what activities are safe for you. General instructions You may need to take these actions to prevent or treat constipation: Drink enough fluid to keep your urine pale yellow. Take over-the-counter or prescription medicines. Eat foods that are high in fiber, such as beans, whole grains, and fresh fruits and vegetables. Limit foods that are high in fat and processed sugars, such as fried or sweet foods. Keep all follow-up visits. This is important. Where to find support If you have been taking opioids for a long time, you may benefit from receiving support for quitting from a local support group or counselor. Ask your health care provider for a referral to these resources in your area. Where to find more information Centers for Disease Control and Prevention  (CDC): FootballExhibition.com.br [place] Food and Drug Administration (FDA): PumpkinSearch.com.ee Get help right away if: You may have taken too much of an opioid (overdosed). Common symptoms of an overdose: Your breathing is slower or more shallow than normal. You have a very slow heartbeat (pulse). You have slurred speech. You have nausea and vomiting. Your pupils become very small. You have other potential symptoms: You are very confused. You faint or feel like you will faint. You have cold, clammy skin. You have blue lips or fingernails. You have thoughts of harming yourself or harming others. These symptoms may represent a serious problem that is an emergency. Do not wait to see if the symptoms will go away. Get medical help right away. Call your local emergency services (911 in the [place] Do not drive yourself to the hospital.  If you ever feel like you may hurt yourself or others, or have thoughts about taking your own life, get help right away. Go to your nearest emergency department or: Call your local emergency services (911 in the [place] Call the [facility] ([phone] in the [place] Call a suicide crisis helpline, such as the National Suicide Prevention Lifeline at 905-640-1513 or 988 in the [place] This is open 24 hours a day in the [place] If you're a Veteran: Call 988 and press 1. This is open 24 hours a day. Text the [employer] at 320-847-1450. Summary Opioid medicines can help you manage moderate to severe pain for a short period of time. A pain treatment plan is an agreement between you and your health care provider. Discuss the goals of your treatment, including how much pain you might expect to have and how you will manage the pain. If you think that you or someone else may have taken too much of an opioid, get medical help right away. This information is not intended to replace advice given to you by your health care provider. Make sure you discuss any questions you have  with your health care provider. Document Revised: 05/20/2023 Document Reviewed: 11/23/2020 Elsevier Patient Education  2024 [employer]. Advanced directives: (Declined) Advance directive discussed with you today. Even though you declined this today, please call our office should you change your mind, and we can give you the proper paperwork for you to fill out. Advance Care Planning is important because it:  [x]  Makes sure you receive the medical care that is consistent with your values, goals, and preferences  [x]  It provides guidance to your family and loved ones and reduces their decisional burden about whether or not they are making the right decisions based on your wishes.  Follow the link provided in your after visit summary or read over the paperwork we have mailed to you to help you started getting your Advance Directives in place. If you need assistance in completing these, please reach out to us  so that we can help you!  See attachments for Preventive Care and Fall Prevention Tips.

## 2024-03-20 NOTE — Progress Notes (Signed)
 Subjective:   Renee Watts is a 73 y.o. who presents for a Medicare Wellness preventive visit.  As a reminder, Annual Wellness Visits don't include a physical exam, and some assessments may be limited, especially if this visit is performed virtually. We may recommend an in-person follow-up visit with your provider if needed.  Visit Complete: Virtual I connected with  Renee Watts on 03/20/24 by a audio enabled telemedicine application and verified that I am speaking with the correct person using two identifiers.  Patient Location: Home  Provider Location: Home Office  I discussed the limitations of evaluation and management by telemedicine. The patient expressed understanding and agreed to proceed.  Vital Signs: Because this visit was a virtual/telehealth visit, some criteria may be missing or patient reported. Any vitals not documented were not able to be obtained and vitals that have been documented are patient reported.    Persons Participating in Visit: Patient.  AWV Questionnaire: No: Patient Medicare AWV questionnaire was not completed prior to this visit.  Cardiac Risk Factors include: advanced age (>45men, >63 women);diabetes mellitus;hypertension     Objective:    Today's Vitals   03/20/24 0848  Weight: 153 lb (69.4 kg)  Height: 5' 5 (1.651 m)   Body mass index is 25.46 kg/m.     03/20/2024    8:57 AM 12/05/2023    8:00 PM 09/17/2023    6:40 AM 09/11/2023   10:11 AM 03/18/2023    9:00 AM 03/13/2022    8:54 AM 05/19/2021    7:37 AM  Advanced Directives  Does Patient Have a Medical Advance Directive? No No No No No No No  Would patient like information on creating a medical advance directive? No - Patient declined No - Patient declined No - Patient declined No - Patient declined No - Patient declined No - Patient declined No - Patient declined    Current Medications (verified) Outpatient Encounter Medications as of 03/20/2024  Medication Sig    Accu-Chek Softclix Lancets lancets daily. for testing as directed   acetaminophen  (TYLENOL ) 325 MG tablet Take 2 tablets (650 mg total) by mouth every 6 (six) hours as needed for mild pain (pain score 1-3), headache or fever.   Ascorbic Acid (VITAMIN C) 1000 MG tablet Take 1,000 mg by mouth daily with breakfast.   Blood Glucose Monitoring Suppl DEVI 1 each by Does not apply route in the morning, at noon, and at bedtime. May substitute to any manufacturer covered by patient's insurance.   celecoxib  (CELEBREX ) 200 MG capsule TAKE 1 CAPSULE (200 MG TOTAL) BY MOUTH 2 (TWO) TIMES DAILY BETWEEN MEALS AS NEEDED.   cholecalciferol (VITAMIN D3) 25 MCG (1000 UNIT) tablet Take 1,000 Units by mouth in the morning.   cyanocobalamin  (VITAMIN B12) 1000 MCG tablet Take 1,000 mcg by mouth daily with breakfast.   empagliflozin  (JARDIANCE ) 10 MG TABS tablet Take 1 tablet (10 mg total) by mouth daily before breakfast.   feeding supplement (ENSURE ENLIVE / ENSURE PLUS) LIQD Take 237 mLs by mouth 2 (two) times daily between meals.   ferrous gluconate  (FERGON) 324 MG tablet Take 1 tablet (324 mg total) by mouth daily. (Patient taking differently: Take 324 mg by mouth daily with breakfast.)   fluticasone  (FLONASE ) 50 MCG/ACT nasal spray Place 1 spray into both nostrils daily. (Patient taking differently: Place 1 spray into both nostrils daily as needed for allergies.)   gabapentin  (NEURONTIN ) 300 MG capsule TAKE 1 CAPSULE BY MOUTH IN THE MORNING, 1 AT LUNCH,  AND 2 AT BEDTIME   Glucose Blood (BLOOD GLUCOSE TEST STRIPS) STRP 1 each by In Vitro route in the morning, at noon, and at bedtime. May substitute to any manufacturer covered by patient's insurance.   HYDROcodone -acetaminophen  (NORCO/VICODIN) 5-325 MG tablet Take 1-2 tablets by mouth every 6 (six) hours as needed for moderate pain (pain score 4-6) or severe pain (pain score 7-10).   Lancets (ONETOUCH DELICA PLUS LANCET30G) MISC USE DAILY AS DIRECTED   Lancets Misc. MISC  1 each by Does not apply route in the morning, at noon, and at bedtime. May substitute to any manufacturer covered by patient's insurance.   levocetirizine (XYZAL ) 5 MG tablet TAKE 1 TABLET BY MOUTH EVERY DAY IN THE EVENING   lisinopril  (ZESTRIL ) 20 MG tablet TAKE 1 TABLET BY MOUTH EVERY DAY (Patient taking differently: Take 20 mg by mouth daily with breakfast.)   methocarbamol  (ROBAXIN ) 500 MG tablet Take 1 tablet (500 mg total) by mouth every 6 (six) hours as needed for muscle spasms.   Misc. Devices (ROLLATOR ULTRA-LIGHT) MISC 1 Device by Does not apply route as directed.   Multiple Vitamin (MULTIVITAMIN WITH MINERALS) TABS tablet Take 1 tablet by mouth daily.   omeprazole  (PRILOSEC  OTC) 20 MG tablet Take 20 mg by mouth daily as needed (acid reflux).   polyvinyl alcohol  (LIQUIFILM TEARS) 1.4 % ophthalmic solution Place 2 drops into both eyes as needed for dry eyes (As needed for dry eyes).   Potassium Chloride  (KLOR-CON  PO) Take 20 mEq by mouth daily.   sertraline  (ZOLOFT ) 25 MG tablet Take 1 tablet (25 mg total) by mouth daily.   Vitamin D , Ergocalciferol , (DRISDOL ) 1.25 MG (50000 UNIT) CAPS capsule Take 1 capsule (50,000 Units total) by mouth every 7 (seven) days.   No facility-administered encounter medications on file as of 03/20/2024.    Allergies (verified) Patient has no known allergies.   History: Past Medical History:  Diagnosis Date   Allergy    Benign paroxysmal positional vertigo 06/25/2013   dx with prior PCP, s/p labs, mri and vestibular rehab; nasal spray for etd seemed to help   Blood in stool 03/11/2012   Dependent edema    Bilateral   Depression    GERD (gastroesophageal reflux disease)    on meds   Hyperlipidemia    on meds   Low back pain    sciatica; sees orthopedic specialist ([facility])for this and shoulder tendinitis   Obesity, diabetes, and hypertension syndrome (HCC)    on med   Osteoarthritis, knee    Bilateral, s/p 3 total knee replacement  surgerues on right, last 2005. Dr. Duwayne   Pellegrini-Steida syndrome    Myositis ossificans   Seasonal allergies    Shingles 07/10/2012   LESIONS CLEARED BUT NOT TOLERATING ANY THING TIGHT AGAINST BODY--SHINGLES WERE AROSS BACK AND ABDOMEN   Past Surgical History:  Procedure Laterality Date   ABDOMINAL HYSTERECTOMY  1984   BLADDER SURGERY  03/2017   bladder tac per pt   COLONOSCOPY  2020   MS-MAC-goly (good)-hems/TAx5)   left shoulder steroid injection     ORIF FEMUR FRACTURE Right 12/06/2023   Procedure: OPEN REDUCTION INTERNAL FIXATION (ORIF) DISTAL FEMUR FRACTURE;  Surgeon: Kendal Franky SQUIBB, MD;  Location: [facility] OR;  Service: Orthopedics;  Laterality: Right;   POLYPECTOMY  2020   TA x 5   TONSILLECTOMY  1977   TOTAL HIP ARTHROPLASTY Right 10/10/2012   Procedure: R TOTAL HIP ARTHROPLASTY ANTERIOR APPROACH;  Surgeon: Lonni CINDERELLA Poli, MD;  Location: [facility];  Service: Orthopedics;  Laterality: Right;  Right Total Hip Arthroplasty, Anterior Approach (C-Arm)   TOTAL KNEE ARTHROPLASTY Right    with multiple revisions, 2003, 2005   TOTAL KNEE ARTHROPLASTY Left 01/09/2013   Procedure: LEFT TOTAL KNEE ARTHROPLASTY;  Surgeon: Lonni CINDERELLA Poli, MD;  Location: [facility];  Service: Orthopedics;  Laterality: Left;   TUBAL LIGATION  1978   WISDOM TOOTH EXTRACTION  2013,2014   Family History  Problem Relation Age of Onset   Diabetes Mother    Hypertension Mother    Hypertension Father    Cancer Father        Lung CA   Coronary artery disease Father    Colon cancer Neg Hx    Colon polyps Neg Hx    Esophageal cancer Neg Hx    Rectal cancer Neg Hx    Stomach cancer Neg Hx    Social History   Socioeconomic History   Marital status: Married    Spouse name: Not on file   Number of children: Not on file   Years of education: 12th grade   Highest education level: Not on file  Occupational History    Employer: [employer]  Tobacco Use   Smoking status: Never   Smokeless  tobacco: Never  Vaping Use   Vaping status: Never Used  Substance and Sexual Activity   Alcohol  use: No   Drug use: No   Sexual activity: Not on file  Other Topics Concern   Not on file  Social History Narrative   Married, 3 daughters.   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   Nicotine: never   ETOH: no   Drugs: no   Social Drivers of Corporate investment banker Strain: Low Risk  (03/20/2024)   Overall Financial Resource Strain (CARDIA)    Difficulty of Paying Living Expenses: Not hard at all  Food Insecurity: No Food Insecurity (03/20/2024)   Hunger Vital Sign    Worried About Running Out of Food in the Last Year: Never true    Ran Out of Food in the Last Year: Never true  Transportation Needs: No Transportation Needs (03/20/2024)   PRAPARE - Administrator, Civil Service (Medical): No    Lack of Transportation (Non-Medical): No  Physical Activity: Sufficiently Active (03/20/2024)   Exercise Vital Sign    Days of Exercise per Week: 7 days    Minutes of Exercise per Session: 30 min  Stress: No Stress Concern Present (03/20/2024)   [employer] of Occupational Health - Occupational Stress Questionnaire    Feeling of Stress: Not at all  Social Connections: Socially Integrated (03/20/2024)   Social Connection and Isolation Panel    Frequency of Communication with Friends and Family: More than three times a week    Frequency of Social Gatherings with Friends and Family: More than three times a week    Attends Religious Services: More than 4 times per year    Active Member of [employer] or Organizations: Yes    Attends Engineer, structural: More than 4 times per year    Marital Status: Married    Tobacco Counseling Counseling given: Not Answered    Clinical Intake:  Pre-visit preparation completed: Yes  Pain : No/denies pain     BMI - recorded: 25.46 Nutritional Status: BMI 25 -29 Overweight Nutritional Risks:  None Diabetes: Yes CBG done?: No Did pt. bring in CBG monitor from home?: No  Lab Results  Component Value Date   HGBA1C 6.7 (A) 03/04/2024   HGBA1C 6.5 (H) 09/11/2023   HGBA1C 5.9 (A) 07/29/2023     How often do you need to have someone help you when you read instructions, pamphlets, or other written materials from your doctor or pharmacy?: 1 - Never  Interpreter Needed?: No  Information entered by :: Rojelio Blush LPN   Activities of Daily Living     03/20/2024    8:54 AM 12/05/2023    8:00 PM  In your present state of health, do you have any difficulty performing the following activities:  Hearing? 0 0  Vision? 0 0  Difficulty concentrating or making decisions? 0 0  Walking or climbing stairs? 1   Comment Uses a Museum/gallery conservator or bathing? 0   Doing errands, shopping? 0 0  Preparing Food and eating ? N   Using the Toilet? N   In the past six months, have you accidently leaked urine? N   Do you have problems with loss of bowel control? N   Managing your Medications? N   Managing your Finances? N   Housekeeping or managing your Housekeeping? N     Patient Care Team: Mercer Clotilda SAUNDERS, MD as PCP - General (Family Medicine) Plyler, Arland HERO, RD as Dietitian (Dietician) Debrah Ade, MD as Consulting Physician (Obstetrics and Gynecology) [facility]  I have updated your Care Teams any recent Medical Services you may have received from other providers in the past year.     Assessment:   This is a routine wellness examination for Renee Watts.  Hearing/Vision screen Hearing Screening - Comments:: Denies hearing difficulties   Vision Screening - Comments:: Wears rx glasses - up to date with routine eye exams with  Dr Octavia   Goals Addressed               This Visit's Progress     Increase physical activity (pt-stated)        I want to walk better.       Depression Screen     03/20/2024    8:53 AM 03/04/2024   10:33 AM  12/25/2023   11:47 AM 11/27/2023    3:23 PM 07/29/2023    8:32 AM 03/18/2023    8:57 AM 11/07/2022    8:14 AM  PHQ 2/9 Scores  PHQ - 2 Score 0 4 2 0 2 0 0  PHQ- 9 Score 0 11 6 6 9  2     Fall Risk     03/20/2024    8:55 AM 03/04/2024   10:33 AM 12/10/2023   10:56 AM 07/29/2023    8:32 AM 03/18/2023    8:58 AM  Fall Risk   Falls in the past year? 1 1 1  0 0  Number falls in past yr: 0 0 0 0 0  Injury with Fall? 1 1 1  0 0  Comment Fx; Femur. Followed by medical attention      Risk for fall due to : Orthopedic patient  History of fall(s);Orthopedic patient No Fall Risks No Fall Risks  Follow up Falls evaluation completed Falls evaluation completed  Falls evaluation completed Falls prevention discussed    MEDICARE RISK AT HOME:  Medicare Risk at Home Any stairs in or around the home?: No If so, are there any without handrails?: No Home free of loose throw rugs in walkways, pet beds, electrical cords, etc?: Yes Adequate lighting in your home to  reduce risk of falls?: Yes Life alert?: No Use of a cane, walker or w/c?: Yes Grab bars in the bathroom?: Yes Shower chair or bench in shower?: No Elevated toilet seat or a handicapped toilet?: Yes  TIMED UP AND GO:  Was the test performed?  No  Cognitive Function: 6CIT completed    04/14/2018   10:20 AM 04/12/2017   12:54 PM  MMSE - Mini Mental State Exam  Not completed: -- --        03/20/2024    8:57 AM 03/18/2023    9:00 AM 03/13/2022    8:55 AM 03/08/2020   10:49 AM  6CIT Screen  What Year? 0 points 0 points 0 points 0 points  What month? 0 points 0 points 0 points 0 points  What time? 0 points 0 points 0 points 0 points  Count back from 20 0 points 0 points 0 points 0 points  Months in reverse 0 points 0 points 0 points 0 points  Repeat phrase 0 points 0 points 0 points 0 points  Total Score 0 points 0 points 0 points 0 points    Immunizations Immunization History  Administered Date(s) Administered   Fluad Quad(high Dose  65+) 06/27/2020, 05/03/2021, 06/07/2022   Fluad Trivalent(High Dose 65+) 07/29/2023   Influenza Split 06/04/2011, 05/07/2012   Influenza Whole 07/08/2007, 05/27/2008, 07/14/2009, 07/18/2010   Influenza, High Dose Seasonal PF 05/10/2015, 05/10/2016, 08/26/2017, 06/11/2018, 05/30/2019   Influenza,inj,Quad PF,6+ Mos 05/26/2013, 06/14/2014   Influenza-Unspecified 05/27/2016   PFIZER(Purple Top)SARS-COV-2 Vaccination 10/19/2019, 11/09/2019, 06/03/2020   Pfizer Covid-19 Vaccine Bivalent Booster 20yrs & up 06/17/2021   Pfizer(Comirnaty)Fall Seasonal Vaccine 12 years and older 05/01/2023   Pneumococcal Conjugate-13 05/10/2015   Pneumococcal Polysaccharide-23 07/18/2010, 05/10/2016   Td 07/18/2010   Tdap 08/03/2021    Screening Tests Health Maintenance  Topic Date Due   Zoster Vaccines- Shingrix (1 of 2) Never done   COVID-19 Vaccine (6 - 2024-25 season) 10/29/2023   INFLUENZA VACCINE  03/27/2024   FOOT EXAM  04/24/2024   HEMOGLOBIN A1C  09/04/2024   MAMMOGRAM  11/05/2024   Diabetic kidney evaluation - Urine ACR  11/26/2024   Diabetic kidney evaluation - eGFR measurement  12/07/2024   OPHTHALMOLOGY EXAM  02/18/2025   LIPID PANEL  03/04/2025   Medicare Annual Wellness (AWV)  03/20/2025   Colonoscopy  05/01/2025   DTaP/Tdap/Td (3 - Td or Tdap) 08/04/2031   Pneumococcal Vaccine: 50+ Years  Completed   DEXA SCAN  Completed   Hepatitis C Screening  Completed   Hepatitis B Vaccines  Aged Out   HPV VACCINES  Aged Out   Meningococcal B Vaccine  Aged Out    Health Maintenance  Health Maintenance Due  Topic Date Due   Zoster Vaccines- Shingrix (1 of 2) Never done   COVID-19 Vaccine (6 - 2024-25 season) 10/29/2023   Health Maintenance Items Addressed:   Additional Screening:  Vision Screening: Recommended annual ophthalmology exams for early detection of glaucoma and other disorders of the eye. Would you like a referral to an eye doctor? No    Dental Screening: Recommended annual  dental exams for proper oral hygiene  Community Resource Referral / Chronic Care Management: CRR required this visit?  No   CCM required this visit?  No   Plan:    I have personally reviewed and noted the following in the patient's chart:   Medical and social history Use of alcohol , tobacco or illicit drugs  Current medications and supplements including opioid prescriptions.  Patient is currently taking opioid prescriptions. Information provided to patient regarding non-opioid alternatives. Patient advised to discuss non-opioid treatment plan with their provider. Functional ability and status Nutritional status Physical activity Advanced directives List of other physicians Hospitalizations, surgeries, and ER visits in previous 12 months Vitals Screenings to include cognitive, depression, and falls Referrals and appointments  In addition, I have reviewed and discussed with patient certain preventive protocols, quality metrics, and best practice recommendations. A written personalized care plan for preventive services as well as general preventive health recommendations were provided to patient.   Rojelio LELON Blush, LPN   [id number]   After Visit Summary: (MyChart) Due to this being a telephonic visit, the after visit summary with patients personalized plan was offered to patient via MyChart   Notes: Nothing significant to report at this time.

## 2024-03-31 DIAGNOSIS — S72461D Displaced supracondylar fracture with intracondylar extension of lower end of right femur, subsequent encounter for closed fracture with routine healing: Secondary | ICD-10-CM | POA: Diagnosis not present

## 2024-04-15 ENCOUNTER — Other Ambulatory Visit: Payer: Self-pay

## 2024-04-15 ENCOUNTER — Ambulatory Visit: Attending: Student | Admitting: Physical Therapy

## 2024-04-15 ENCOUNTER — Encounter: Payer: Self-pay | Admitting: Physical Therapy

## 2024-04-15 DIAGNOSIS — R2689 Other abnormalities of gait and mobility: Secondary | ICD-10-CM | POA: Diagnosis not present

## 2024-04-15 DIAGNOSIS — M6281 Muscle weakness (generalized): Secondary | ICD-10-CM | POA: Diagnosis not present

## 2024-04-15 DIAGNOSIS — M79604 Pain in right leg: Secondary | ICD-10-CM | POA: Insufficient documentation

## 2024-04-15 DIAGNOSIS — M25551 Pain in right hip: Secondary | ICD-10-CM | POA: Insufficient documentation

## 2024-04-15 NOTE — Therapy (Signed)
 OUTPATIENT PHYSICAL THERAPY LOWER EXTREMITY EVALUATION   Patient Name: Renee Watts MRN: [id number] DOB:[birth year], 73 y.o., female Today's Date: 04/15/2024  END OF SESSION:  PT End of Session - 04/15/24 1050     Visit Number 1    Number of Visits 16    Date for PT Re-Evaluation 06/10/24    Authorization Type [employer]    Progress Note Due on Visit 10    PT Start Time 1017    PT Stop Time 1100    PT Time Calculation (min) 43 min          Past Medical History:  Diagnosis Date   Allergy    Benign paroxysmal positional vertigo 06/25/2013   dx with prior PCP, s/p labs, mri and vestibular rehab; nasal spray for etd seemed to help   Blood in stool 03/11/2012   Dependent edema    Bilateral   Depression    GERD (gastroesophageal reflux disease)    on meds   Hyperlipidemia    on meds   Low back pain    sciatica; sees orthopedic specialist ([facility])for this and shoulder tendinitis   Obesity, diabetes, and hypertension syndrome (HCC)    on med   Osteoarthritis, knee    Bilateral, s/p 3 total knee replacement surgerues on right, last 2005. Dr. Duwayne   Pellegrini-Steida syndrome    Myositis ossificans   Seasonal allergies    Shingles 07/10/2012   LESIONS CLEARED BUT NOT TOLERATING ANY THING TIGHT AGAINST BODY--SHINGLES WERE AROSS BACK AND ABDOMEN   Past Surgical History:  Procedure Laterality Date   ABDOMINAL HYSTERECTOMY  1984   BLADDER SURGERY  03/2017   bladder tac per pt   COLONOSCOPY  2020   MS-MAC-goly (good)-hems/TAx5)   left shoulder steroid injection     ORIF FEMUR FRACTURE Right 12/06/2023   Procedure: OPEN REDUCTION INTERNAL FIXATION (ORIF) DISTAL FEMUR FRACTURE;  Surgeon: Kendal Franky SQUIBB, MD;  Location: [facility];  Service: Orthopedics;  Laterality: Right;   POLYPECTOMY  2020   TA x 5   TONSILLECTOMY  1977   TOTAL HIP ARTHROPLASTY Right 10/10/2012   Procedure: R TOTAL HIP ARTHROPLASTY ANTERIOR APPROACH;  Surgeon: Lonni CINDERELLA Poli,  MD;  Location: [facility];  Service: Orthopedics;  Laterality: Right;  Right Total Hip Arthroplasty, Anterior Approach (C-Arm)   TOTAL KNEE ARTHROPLASTY Right    with multiple revisions, 2003, 2005   TOTAL KNEE ARTHROPLASTY Left 01/09/2013   Procedure: LEFT TOTAL KNEE ARTHROPLASTY;  Surgeon: Lonni CINDERELLA Poli, MD;  Location: [facility];  Service: Orthopedics;  Laterality: Left;   TUBAL LIGATION  1978   WISDOM TOOTH EXTRACTION  2013,2014   Patient Active Problem List   Diagnosis Date Noted   Closed fracture of distal end of right femur, unspecified fracture morphology, initial encounter (HCC) 12/05/2023   Lumbar radiculopathy 09/17/2023   Osteopenia 02/15/2021   Venous stasis of both lower extremities 06/27/2020   Bilateral lower extremity edema 02/05/2020   Recurrent sinusitis 09/03/2018   Vaginal vault prolapse after hysterectomy 02/13/2017   Obesity 01/06/2016   Low back pain 01/06/2016   Normocytic anemia 03/12/2013   Diabetic peripheral neuropathy (HCC) 09/01/2012   Hyperlipemia 09/16/2006   Hypertension associated with diabetes (HCC) 09/16/2006   GERD 09/16/2006   Type II diabetes mellitus with neurological manifestations (HCC) 06/17/2006   Osteoarthritis 06/17/2006   DEPENDENT EDEMA, LEGS, BILATERAL 06/17/2006    PCP: Mercer Clotilda SAUNDERS, MD  REFERRING PROVIDER: Kendal Franky SQUIBB, MD  REFERRING DIAG: ORIF of  right distal fracture performed on 12/06/23  THERAPY DIAG:  Pain in right leg  Pain in right hip  Muscle weakness (generalized)  Other abnormalities of gait and mobility  Rationale for Evaluation and Treatment: Rehabilitation  ONSET DATE: 12/06/23  SUBJECTIVE:   SUBJECTIVE STATEMENT: Pt states she has been doing home health PT since her 12/06/23 hip surgery. Was d/c-ed ~1 month ago. Was able to walk 500 feet with PT. Pt reports pain is more in the inside of her medial thigh. No pain where the incision is laterally. Has been using quad cane intermittently in the  house. Pt is fearful of another fall. Pt notes she gets fatigued with prolonged standing but not limited by pain.   PERTINENT HISTORY: L3/4 laminectomy and PSIF on 09/17/23; neuropathy in legs, bilat knee replacement, R hip replacement   PAIN:  Are you having pain? Yes: NPRS scale: 0 currently; at worst 10/10 Pain location: R medial thigh Pain description: sometimes sharp or achy Aggravating factors: Rolling in bed, stepping wrong, getting up quickly Relieving factors: hydrocodone , ice/heat  PRECAUTIONS: Fall  RED FLAGS: None   WEIGHT BEARING RESTRICTIONS: WBAT with no ROM restrictions per 12/06/23 surgery note  FALLS:  Has patient fallen in last 6 months? Yes. Number of falls 1  -- 12/05/23 resulting in her femur fracture  LIVING ENVIRONMENT: Lives with: lives with their spouse; children and grandchildren are in and out Lives in: House/apartment Stairs: 1 step up to enter Has following equipment at home: Single point cane, Quad cane small base, Walker - 2 wheeled, Grab bars, and walk in shower  OCCUPATION: Retired - loves to [employer] and entertainment  PLOF: Independent  PATIENT GOALS: Return to cooking and normal activities without having to stop and rest  NEXT MD VISIT: 06/04/24 to see PCP  OBJECTIVE:  Note: Objective measures were completed at Evaluation unless otherwise noted.  DIAGNOSTIC FINDINGS: X-rays of the lumbar spine from 03/09/2024 were independently reviewed and interpreted, showing laminectomy defect at L3/4.  Spondylolisthesis at L3/4 that does not shift between flexion and extension views.  Posterior instrumentation at L3 and L4.  No lucency seen around the screws and none of them have backed out.  No fracture or dislocation seen.   PATIENT SURVEYS:  Lower Extremity Functional Score: 33 / 80 = 41.3 %  COGNITION: Overall cognitive status: Within functional limits for tasks assessed     SENSATION: Neuropathy in bilat LEs -- knee to ankle  EDEMA:  Will swell  if legs are down  MUSCLE LENGTH: Hamstrings: See knee AROM below  POSTURE: flexed trunk , weight shift left, and knees flexed  PALPATION: TTP medial knee/adductors  LOWER EXTREMITY ROM:  Active ROM Right eval Left eval  Hip flexion sitting 95 PROM WNL in sup At least 110 deg  Hip extension    Hip abduction    Hip adduction    Hip internal rotation    Hip external rotation    Knee flexion sitting 80 110  Knee extension sitting -32 PROM can get -10 in sup -10  Ankle dorsiflexion    Ankle plantarflexion    Ankle inversion    Ankle eversion     (Blank rows = not tested)  LOWER EXTREMITY MMT:   MMT Right eval Left eval  Hip flexion 3- 3+  Hip extension    Hip abduction supine 3+ 3+  Hip adduction    Hip internal rotation    Hip external rotation    Knee flexion 4 4  Knee extension 3- 3+  Ankle dorsiflexion    Ankle plantarflexion    Ankle inversion    Ankle eversion     (Blank rows = not tested)  LOWER EXTREMITY SPECIAL TESTS:  Hip special tests: Belvie (FABER) test: positive  and Thomas test: positive   FUNCTIONAL TESTS:  5 times sit to stand: with UE support 49.86 sec  GAIT: Distance walked: Into clinic Assistive device utilized: Environmental consultant - 2 wheeled Level of assistance: Modified independence Comments: Diminished swing phase on R, bilat steppage gait, diminished heel/toe on R, flexed trunk and knees                                                                                                                                TREATMENT DATE: 04/15/24 See HEP below    PATIENT EDUCATION:  Education details: Exam findings, POC, initial HEP Person educated: Patient Education method: Explanation, Demonstration, and Handouts Education comprehension: verbalized understanding, returned demonstration, and needs further education  HOME EXERCISE PROGRAM: Access Code: [email or web address] URL: [email or web address] Date: 04/15/2024 Prepared by: Deneka Greenwalt  April Earnie Starring  Exercises - Supine Heel Slide with Strap  - 2-3 x daily - 7 x weekly - 1 sets - 10 reps - Hooklying Single Knee to Chest Stretch with Towel  - 2-3 x daily - 7 x weekly - 1 sets - 10 reps - Supine Single Bent Knee Fallout  - 2-3 x daily - 7 x weekly - 1 sets - 10 reps - Supine Quad Set  - 2-3 x daily - 7 x weekly - 1 sets - 10 reps  ASSESSMENT:  CLINICAL IMPRESSION: Patient is a 73 y.o. F who was seen today for physical therapy evaluation and treatment for R ORIF on 12/06/23. PMH is significant for bilat knee replacement, prior R THA, bilat LE neuropathy and lumbar surgery in January 2025. Assessment is significant for decreased hip and knee ROM, highly weakened quad and hip musculature and FOF affecting pt's home and community mobility. Pt is considered a high fall risk based on her 5x STS. Pt will benefit from PT to address these issues to maximize her level of function.   OBJECTIVE IMPAIRMENTS: Abnormal gait, decreased activity tolerance, decreased balance, decreased coordination, decreased endurance, decreased mobility, difficulty walking, decreased ROM, decreased strength, hypomobility, increased fascial restrictions, impaired flexibility, improper body mechanics, postural dysfunction, and pain.   ACTIVITY LIMITATIONS: carrying, lifting, bending, sitting, standing, squatting, sleeping, stairs, transfers, bed mobility, dressing, hygiene/grooming, and locomotion level  PARTICIPATION LIMITATIONS: meal prep, cleaning, driving, shopping, and community activity  PERSONAL FACTORS: Age, Fitness, Past/current experiences, and Time since onset of injury/illness/exacerbation are also affecting patient's functional outcome.   REHAB POTENTIAL: Good  CLINICAL DECISION MAKING: Evolving/moderate complexity  EVALUATION COMPLEXITY: Moderate   GOALS: Goals reviewed with patient? Yes  SHORT TERM GOALS: Target date: 05/13/2024  Pt will be ind with initial HEP Baseline: Goal  status: INITIAL  2.  Pt will have improved  hip flexion to at least 110 deg AROM Baseline:  Goal status: INITIAL  3.  Pt will report improved medial thigh/knee pain by >/=25% Baseline:  Goal status: INITIAL   LONG TERM GOALS: Target date: 06/10/2024   Pt will be ind with management and progression of HEP Baseline:  Goal status: INITIAL  2.  Pt will be able to amb at least 400' with LRAD for increased home and limited community mobility Baseline:  Goal status: INITIAL  3.  Pt will have improved 5x STS to </=20 sec to reduce her fall risk and increase functional LE strength Baseline:  Goal status: INITIAL  4.  Pt will have improved R knee ext to within at least 10 deg of full extension to demo increased quad strength Baseline: 32 deg away from full ext Goal status: INITIAL  5.  Pt will have improved LEFS score to >/=42/80 to demo MCID Baseline: 33/80 Goal status: INITIAL    PLAN:  PT FREQUENCY: 2x/week  PT DURATION: 8 weeks  PLANNED INTERVENTIONS: 97164- PT Re-evaluation, 97750- Physical Performance Testing, 97110-Therapeutic exercises, 97530- Therapeutic activity, 97112- Neuromuscular re-education, 97535- Self Care, 02859- Manual therapy, [id number]- Gait training, [phone]- Aquatic Therapy, [phone]- Electrical stimulation (unattended), 97016- Vasopneumatic device, [id number]- Ultrasound, [id number]- Ionotophoresis 4mg /ml Dexamethasone , 79439 (1-2 muscles), 20561 (3+ muscles)- Dry Needling, Patient/Family education, Balance training, Stair training, Taping, Joint mobilization, Spinal mobilization, Vestibular training, Cryotherapy, and Moist heat  PLAN FOR NEXT SESSION: Assess response to HEP. Quad strengthening. Hip mobility and strengthening. Balance and gait training.    Shacola Schussler April Ma L Catrinia Racicot, PT, DPT 04/15/2024, 1:15 PM

## 2024-04-21 ENCOUNTER — Ambulatory Visit: Admitting: Physical Therapy

## 2024-04-21 DIAGNOSIS — M79604 Pain in right leg: Secondary | ICD-10-CM

## 2024-04-21 DIAGNOSIS — M6281 Muscle weakness (generalized): Secondary | ICD-10-CM

## 2024-04-21 DIAGNOSIS — R2689 Other abnormalities of gait and mobility: Secondary | ICD-10-CM

## 2024-04-21 DIAGNOSIS — M25551 Pain in right hip: Secondary | ICD-10-CM

## 2024-04-21 NOTE — Therapy (Signed)
 OUTPATIENT PHYSICAL THERAPY TREATMENT   Patient Name: Renee Watts MRN: [id number] DOB:09/08/1950, 73 y.o., female Today's Date: 04/21/2024  END OF SESSION:  PT End of Session - 04/21/24 1451     Visit Number 2    Number of Visits 16    Date for PT Re-Evaluation 06/10/24    Authorization Type [employer]    Progress Note Due on Visit 10    PT Start Time 1450    PT Stop Time 1530    PT Time Calculation (min) 40 min    Activity Tolerance Patient tolerated treatment well           Past Medical History:  Diagnosis Date   Allergy    Benign paroxysmal positional vertigo 06/25/2013   dx with prior PCP, s/p labs, mri and vestibular rehab; nasal spray for etd seemed to help   Blood in stool 03/11/2012   Dependent edema    Bilateral   Depression    GERD (gastroesophageal reflux disease)    on meds   Hyperlipidemia    on meds   Low back pain    sciatica; sees orthopedic specialist ([facility])for this and shoulder tendinitis   Obesity, diabetes, and hypertension syndrome (HCC)    on med   Osteoarthritis, knee    Bilateral, s/p 3 total knee replacement surgerues on right, last 2005. Dr. Duwayne   Pellegrini-Steida syndrome    Myositis ossificans   Seasonal allergies    Shingles 07/10/2012   LESIONS CLEARED BUT NOT TOLERATING ANY THING TIGHT AGAINST BODY--SHINGLES WERE AROSS BACK AND ABDOMEN   Past Surgical History:  Procedure Laterality Date   ABDOMINAL HYSTERECTOMY  1984   BLADDER SURGERY  03/2017   bladder tac per pt   COLONOSCOPY  2020   MS-MAC-goly (good)-hems/TAx5)   left shoulder steroid injection     ORIF FEMUR FRACTURE Right 12/06/2023   Procedure: OPEN REDUCTION INTERNAL FIXATION (ORIF) DISTAL FEMUR FRACTURE;  Surgeon: Kendal Franky SQUIBB, MD;  Location: [facility];  Service: Orthopedics;  Laterality: Right;   POLYPECTOMY  2020   TA x 5   TONSILLECTOMY  1977   TOTAL HIP ARTHROPLASTY Right 10/10/2012   Procedure: R TOTAL HIP ARTHROPLASTY ANTERIOR  APPROACH;  Surgeon: Lonni CINDERELLA Poli, MD;  Location: [facility];  Service: Orthopedics;  Laterality: Right;  Right Total Hip Arthroplasty, Anterior Approach (C-Arm)   TOTAL KNEE ARTHROPLASTY Right    with multiple revisions, 2003, 2005   TOTAL KNEE ARTHROPLASTY Left 01/09/2013   Procedure: LEFT TOTAL KNEE ARTHROPLASTY;  Surgeon: Lonni CINDERELLA Poli, MD;  Location: [facility];  Service: Orthopedics;  Laterality: Left;   TUBAL LIGATION  1978   WISDOM TOOTH EXTRACTION  2013,2014   Patient Active Problem List   Diagnosis Date Noted   Closed fracture of distal end of right femur, unspecified fracture morphology, initial encounter (HCC) 12/05/2023   Lumbar radiculopathy 09/17/2023   Osteopenia 02/15/2021   Venous stasis of both lower extremities 06/27/2020   Bilateral lower extremity edema 02/05/2020   Recurrent sinusitis 09/03/2018   Vaginal vault prolapse after hysterectomy 02/13/2017   Obesity 01/06/2016   Low back pain 01/06/2016   Normocytic anemia 03/12/2013   Diabetic peripheral neuropathy (HCC) 09/01/2012   Hyperlipemia 09/16/2006   Hypertension associated with diabetes (HCC) 09/16/2006   GERD 09/16/2006   Type II diabetes mellitus with neurological manifestations (HCC) 06/17/2006   Osteoarthritis 06/17/2006   DEPENDENT EDEMA, LEGS, BILATERAL 06/17/2006    PCP: Mercer Clotilda SAUNDERS, MD  REFERRING PROVIDER: Haddix,  Franky SQUIBB, MD  REFERRING DIAG: ORIF of right distal fracture performed on 12/06/23  THERAPY DIAG:  No diagnosis found.  Rationale for Evaluation and Treatment: Rehabilitation  ONSET DATE: 12/06/23  SUBJECTIVE:   SUBJECTIVE STATEMENT: Pt reports compliance to her HEP. Feels that the leg is doing a little better. Has to take her pain medicine prior to doing the exercises.   From eval: Pt states she has been doing home health PT since her 12/06/23 hip surgery. Was d/c-ed ~1 month ago. Was able to walk 500 feet with PT. Pt reports pain is more in the inside of her  medial thigh. No pain where the incision is laterally. Has been using quad cane intermittently in the house. Pt is fearful of another fall. Pt notes she gets fatigued with prolonged standing but not limited by pain.   PERTINENT HISTORY: L3/4 laminectomy and PSIF on 09/17/23; neuropathy in legs, bilat knee replacement, R hip replacement   PAIN:  Are you having pain? Yes: NPRS scale: 0 currently; at worst 10/10 Pain location: R medial thigh Pain description: sometimes sharp or achy Aggravating factors: Rolling in bed, stepping wrong, getting up quickly Relieving factors: hydrocodone , ice/heat  PRECAUTIONS: Fall  RED FLAGS: None   WEIGHT BEARING RESTRICTIONS: WBAT with no ROM restrictions per 12/06/23 surgery note  FALLS:  Has patient fallen in last 6 months? Yes. Number of falls 1  -- 12/05/23 resulting in her femur fracture  LIVING ENVIRONMENT: Lives with: lives with their spouse; children and grandchildren are in and out Lives in: House/apartment Stairs: 1 step up to enter Has following equipment at home: Single point cane, Quad cane small base, Walker - 2 wheeled, Grab bars, and walk in shower  OCCUPATION: Retired - loves to [employer] and entertainment  PLOF: Independent  PATIENT GOALS: Return to cooking and normal activities without having to stop and rest  NEXT MD VISIT: 06/04/24 to see PCP  OBJECTIVE:  Note: Objective measures were completed at Evaluation unless otherwise noted.  DIAGNOSTIC FINDINGS: X-rays of the lumbar spine from 03/09/2024 were independently reviewed and interpreted, showing laminectomy defect at L3/4.  Spondylolisthesis at L3/4 that does not shift between flexion and extension views.  Posterior instrumentation at L3 and L4.  No lucency seen around the screws and none of them have backed out.  No fracture or dislocation seen.   PATIENT SURVEYS:  Lower Extremity Functional Score: 33 / 80 = 41.3 %  COGNITION: Overall cognitive status: Within functional limits  for tasks assessed     SENSATION: Neuropathy in bilat LEs -- knee to ankle  EDEMA:  Will swell if legs are down  MUSCLE LENGTH: Hamstrings: See knee AROM below  POSTURE: flexed trunk , weight shift left, and knees flexed  PALPATION: TTP medial knee/adductors  LOWER EXTREMITY ROM:  Active ROM Right eval Left eval  Hip flexion sitting 95 PROM WNL in sup At least 110 deg  Hip extension    Hip abduction    Hip adduction    Hip internal rotation    Hip external rotation    Knee flexion sitting 80 110  Knee extension sitting -32 PROM can get -10 in sup -10  Ankle dorsiflexion    Ankle plantarflexion    Ankle inversion    Ankle eversion     (Blank rows = not tested)  LOWER EXTREMITY MMT:   MMT Right eval Left eval  Hip flexion 3- 3+  Hip extension    Hip abduction supine 3+ 3+  Hip  adduction    Hip internal rotation    Hip external rotation    Knee flexion 4 4  Knee extension 3- 3+  Ankle dorsiflexion    Ankle plantarflexion    Ankle inversion    Ankle eversion     (Blank rows = not tested)  LOWER EXTREMITY SPECIAL TESTS:  Hip special tests: Belvie (FABER) test: positive  and Thomas test: positive   FUNCTIONAL TESTS:  5 times sit to stand: with UE support 49.86 sec  GAIT: Distance walked: Into clinic Assistive device utilized: Environmental consultant - 2 wheeled Level of assistance: Modified independence Comments: Diminished swing phase on R, bilat steppage gait, diminished heel/toe on R, flexed trunk and knees                                                                                                                                [facility] Adult PT Treatment:                                                DATE: 04/21/24 Therapeutic Exercise: Nustep L3 x 8 min UEs/LEs Seated heel slide with quad set 2x10 Seated hip flexion iso 10x3 Seated hip flexion PROM with pt sliding RW forward x10 Seated clamshell red TB 2x10 Neuromuscular re-ed: Seated anterior/posterior  pelvic tilt first without and then on dynadisc x20    04/15/24 See HEP below    PATIENT EDUCATION:  Education details: Exam findings, POC, initial HEP Person educated: Patient Education method: Explanation, Demonstration, and Handouts Education comprehension: verbalized understanding, returned demonstration, and needs further education  HOME EXERCISE PROGRAM: Access Code: [email or web address] URL: [email or web address] Date: 04/21/2024 Prepared by: Kymari Nuon April Marie Rossetta Kama  Exercises - Seated Heel Slide  - 1 x daily - 7 x weekly - 2 sets - 10 reps - Seated Quad Set  - 1 x daily - 7 x weekly - 2 sets - 10 reps - 3 sec hold - Seated Hip Abduction with Resistance  - 2 x daily - 7 x weekly - 1 sets - 10 reps - 3 sec hold - Seated Hip Flexion Isometric  - 2 x daily - 7 x weekly - 1 sets - 10 reps - 3 sec hold - Seated Active Hip Flexion  - 2 x daily - 7 x weekly - 1 sets - 10 reps  ASSESSMENT:  CLINICAL IMPRESSION: Treatment focused on progressing pt's HEP. Continuing to work on hip mobility and strengthening within pt's tolerance. Some increased in hip AAROM noted today on Nustep. Difficulty with core engagement/coordination of pelvis with pelvic tilting. Will require more practice  From eval: Patient is a 73 y.o. F who was seen today for physical therapy evaluation and treatment for R ORIF on 12/06/23. PMH is significant for bilat knee replacement, prior R THA, bilat LE neuropathy and  lumbar surgery in January 2025. Assessment is significant for decreased hip and knee ROM, highly weakened quad and hip musculature and FOF affecting pt's home and community mobility. Pt is considered a high fall risk based on her 5x STS. Pt will benefit from PT to address these issues to maximize her level of function.   OBJECTIVE IMPAIRMENTS: Abnormal gait, decreased activity tolerance, decreased balance, decreased coordination, decreased endurance, decreased mobility, difficulty walking, decreased ROM,  decreased strength, hypomobility, increased fascial restrictions, impaired flexibility, improper body mechanics, postural dysfunction, and pain.   ACTIVITY LIMITATIONS: carrying, lifting, bending, sitting, standing, squatting, sleeping, stairs, transfers, bed mobility, dressing, hygiene/grooming, and locomotion level  PARTICIPATION LIMITATIONS: meal prep, cleaning, driving, shopping, and community activity  PERSONAL FACTORS: Age, Fitness, Past/current experiences, and Time since onset of injury/illness/exacerbation are also affecting patient's functional outcome.   REHAB POTENTIAL: Good  CLINICAL DECISION MAKING: Evolving/moderate complexity  EVALUATION COMPLEXITY: Moderate   GOALS: Goals reviewed with patient? Yes  SHORT TERM GOALS: Target date: 05/13/2024  Pt will be ind with initial HEP Baseline: Goal status: INITIAL  2.  Pt will have improved hip flexion to at least 110 deg AROM Baseline:  Goal status: INITIAL  3.  Pt will report improved medial thigh/knee pain by >/=25% Baseline:  Goal status: INITIAL   LONG TERM GOALS: Target date: 06/10/2024   Pt will be ind with management and progression of HEP Baseline:  Goal status: INITIAL  2.  Pt will be able to amb at least 400' with LRAD for increased home and limited community mobility Baseline:  Goal status: INITIAL  3.  Pt will have improved 5x STS to </=20 sec to reduce her fall risk and increase functional LE strength Baseline:  Goal status: INITIAL  4.  Pt will have improved R knee ext to within at least 10 deg of full extension to demo increased quad strength Baseline: 32 deg away from full ext Goal status: INITIAL  5.  Pt will have improved LEFS score to >/=42/80 to demo MCID Baseline: 33/80 Goal status: INITIAL    PLAN:  PT FREQUENCY: 2x/week  PT DURATION: 8 weeks  PLANNED INTERVENTIONS: 97164- PT Re-evaluation, 97750- Physical Performance Testing, 97110-Therapeutic exercises, 97530- Therapeutic  activity, 97112- Neuromuscular re-education, 97535- Self Care, 02859- Manual therapy, [id number]- Gait training, [phone]- Aquatic Therapy, [phone]- Electrical stimulation (unattended), 97016- Vasopneumatic device, [id number]- Ultrasound, [id number]- Ionotophoresis 4mg /ml Dexamethasone , 79439 (1-2 muscles), 20561 (3+ muscles)- Dry Needling, Patient/Family education, Balance training, Stair training, Taping, Joint mobilization, Spinal mobilization, Vestibular training, Cryotherapy, and Moist heat  PLAN FOR NEXT SESSION: Assess response to HEP. Quad strengthening. Hip mobility and strengthening. Balance and gait training.    Ambrosio Reuter April Ma L Hank Walling, PT, DPT 04/21/2024, 2:56 PM

## 2024-04-23 ENCOUNTER — Ambulatory Visit: Admitting: Physical Therapy

## 2024-04-23 ENCOUNTER — Encounter: Payer: Self-pay | Admitting: Physical Therapy

## 2024-04-23 DIAGNOSIS — R2689 Other abnormalities of gait and mobility: Secondary | ICD-10-CM

## 2024-04-23 DIAGNOSIS — M79604 Pain in right leg: Secondary | ICD-10-CM

## 2024-04-23 DIAGNOSIS — M6281 Muscle weakness (generalized): Secondary | ICD-10-CM

## 2024-04-23 DIAGNOSIS — M25551 Pain in right hip: Secondary | ICD-10-CM

## 2024-04-23 NOTE — Therapy (Signed)
 OUTPATIENT PHYSICAL THERAPY TREATMENT   Patient Name: Renee Watts MRN: [id number] DOB:1950-11-29, 73 y.o., female Today's Date: 04/23/2024  END OF SESSION:  PT End of Session - 04/23/24 0851     Visit Number 3    Number of Visits 16    Date for PT Re-Evaluation 06/10/24    Authorization Type [employer]    Progress Note Due on Visit 10    PT Start Time 0845    PT Stop Time 0925    PT Time Calculation (min) 40 min    Activity Tolerance Patient tolerated treatment well           Past Medical History:  Diagnosis Date   Allergy    Benign paroxysmal positional vertigo 06/25/2013   dx with prior PCP, s/p labs, mri and vestibular rehab; nasal spray for etd seemed to help   Blood in stool 03/11/2012   Dependent edema    Bilateral   Depression    GERD (gastroesophageal reflux disease)    on meds   Hyperlipidemia    on meds   Low back pain    sciatica; sees orthopedic specialist ([facility])for this and shoulder tendinitis   Obesity, diabetes, and hypertension syndrome (HCC)    on med   Osteoarthritis, knee    Bilateral, s/p 3 total knee replacement surgerues on right, last 2005. Dr. Duwayne   Pellegrini-Steida syndrome    Myositis ossificans   Seasonal allergies    Shingles 07/10/2012   LESIONS CLEARED BUT NOT TOLERATING ANY THING TIGHT AGAINST BODY--SHINGLES WERE AROSS BACK AND ABDOMEN   Past Surgical History:  Procedure Laterality Date   ABDOMINAL HYSTERECTOMY  1984   BLADDER SURGERY  03/2017   bladder tac per pt   COLONOSCOPY  2020   MS-MAC-goly (good)-hems/TAx5)   left shoulder steroid injection     ORIF FEMUR FRACTURE Right 12/06/2023   Procedure: OPEN REDUCTION INTERNAL FIXATION (ORIF) DISTAL FEMUR FRACTURE;  Surgeon: Kendal Franky SQUIBB, MD;  Location: [facility];  Service: Orthopedics;  Laterality: Right;   POLYPECTOMY  2020   TA x 5   TONSILLECTOMY  1977   TOTAL HIP ARTHROPLASTY Right 10/10/2012   Procedure: R TOTAL HIP ARTHROPLASTY ANTERIOR  APPROACH;  Surgeon: Lonni CINDERELLA Poli, MD;  Location: [facility];  Service: Orthopedics;  Laterality: Right;  Right Total Hip Arthroplasty, Anterior Approach (C-Arm)   TOTAL KNEE ARTHROPLASTY Right    with multiple revisions, 2003, 2005   TOTAL KNEE ARTHROPLASTY Left 01/09/2013   Procedure: LEFT TOTAL KNEE ARTHROPLASTY;  Surgeon: Lonni CINDERELLA Poli, MD;  Location: [facility];  Service: Orthopedics;  Laterality: Left;   TUBAL LIGATION  1978   WISDOM TOOTH EXTRACTION  2013,2014   Patient Active Problem List   Diagnosis Date Noted   Closed fracture of distal end of right femur, unspecified fracture morphology, initial encounter (HCC) 12/05/2023   Lumbar radiculopathy 09/17/2023   Osteopenia 02/15/2021   Venous stasis of both lower extremities 06/27/2020   Bilateral lower extremity edema 02/05/2020   Recurrent sinusitis 09/03/2018   Vaginal vault prolapse after hysterectomy 02/13/2017   Obesity 01/06/2016   Low back pain 01/06/2016   Normocytic anemia 03/12/2013   Diabetic peripheral neuropathy (HCC) 09/01/2012   Hyperlipemia 09/16/2006   Hypertension associated with diabetes (HCC) 09/16/2006   GERD 09/16/2006   Type II diabetes mellitus with neurological manifestations (HCC) 06/17/2006   Osteoarthritis 06/17/2006   DEPENDENT EDEMA, LEGS, BILATERAL 06/17/2006    PCP: Mercer Clotilda SAUNDERS, MD  REFERRING PROVIDER: Haddix,  Franky SQUIBB, MD  REFERRING DIAG: ORIF of right distal fracture performed on 12/06/23  THERAPY DIAG:  Pain in right leg  Pain in right hip  Muscle weakness (generalized)  Other abnormalities of gait and mobility  Rationale for Evaluation and Treatment: Rehabilitation  ONSET DATE: 12/06/23  SUBJECTIVE:   SUBJECTIVE STATEMENT: Reports more stiffness than pain today. States she was sore and fatigued after last session.   From eval: Pt states she has been doing home health PT since her 12/06/23 hip surgery. Was d/c-ed ~1 month ago. Was able to walk 500 feet with PT.  Pt reports pain is more in the inside of her medial thigh. No pain where the incision is laterally. Has been using quad cane intermittently in the house. Pt is fearful of another fall. Pt notes she gets fatigued with prolonged standing but not limited by pain.   PERTINENT HISTORY: L3/4 laminectomy and PSIF on 09/17/23; neuropathy in legs, bilat knee replacement, R hip replacement   PAIN:  Are you having pain? Yes: NPRS scale: 0 currently; at worst 10/10 Pain location: R medial thigh Pain description: sometimes sharp or achy Aggravating factors: Rolling in bed, stepping wrong, getting up quickly Relieving factors: hydrocodone , ice/heat  PRECAUTIONS: Fall  RED FLAGS: None   WEIGHT BEARING RESTRICTIONS: WBAT with no ROM restrictions per 12/06/23 surgery note  FALLS:  Has patient fallen in last 6 months? Yes. Number of falls 1  -- 12/05/23 resulting in her femur fracture  LIVING ENVIRONMENT: Lives with: lives with their spouse; children and grandchildren are in and out Lives in: House/apartment Stairs: 1 step up to enter Has following equipment at home: Single point cane, Quad cane small base, Walker - 2 wheeled, Grab bars, and walk in shower  OCCUPATION: Retired - loves to [employer] and entertainment  PLOF: Independent  PATIENT GOALS: Return to cooking and normal activities without having to stop and rest  NEXT MD VISIT: 06/04/24 to see PCP  OBJECTIVE:  Note: Objective measures were completed at Evaluation unless otherwise noted.  DIAGNOSTIC FINDINGS: X-rays of the lumbar spine from 03/09/2024 were independently reviewed and interpreted, showing laminectomy defect at L3/4.  Spondylolisthesis at L3/4 that does not shift between flexion and extension views.  Posterior instrumentation at L3 and L4.  No lucency seen around the screws and none of them have backed out.  No fracture or dislocation seen.   PATIENT SURVEYS:  Lower Extremity Functional Score: 33 / 80 = 41.3  %  COGNITION: Overall cognitive status: Within functional limits for tasks assessed     SENSATION: Neuropathy in bilat LEs -- knee to ankle  EDEMA:  Will swell if legs are down  MUSCLE LENGTH: Hamstrings: See knee AROM below  POSTURE: flexed trunk , weight shift left, and knees flexed  PALPATION: TTP medial knee/adductors  LOWER EXTREMITY ROM:  Active ROM Right eval Left eval  Hip flexion sitting 95 PROM WNL in sup At least 110 deg  Hip extension    Hip abduction    Hip adduction    Hip internal rotation    Hip external rotation    Knee flexion sitting 80 110  Knee extension sitting -32 PROM can get -10 in sup -10  Ankle dorsiflexion    Ankle plantarflexion    Ankle inversion    Ankle eversion     (Blank rows = not tested)  LOWER EXTREMITY MMT:   MMT Right eval Left eval  Hip flexion 3- 3+  Hip extension    Hip  abduction supine 3+ 3+  Hip adduction    Hip internal rotation    Hip external rotation    Knee flexion 4 4  Knee extension 3- 3+  Ankle dorsiflexion    Ankle plantarflexion    Ankle inversion    Ankle eversion     (Blank rows = not tested)  LOWER EXTREMITY SPECIAL TESTS:  Hip special tests: Belvie (FABER) test: positive  and Thomas test: positive   FUNCTIONAL TESTS:  5 times sit to stand: with UE support 49.86 sec  GAIT: Distance walked: Into clinic Assistive device utilized: Environmental consultant - 2 wheeled Level of assistance: Modified independence Comments: Diminished swing phase on R, bilat steppage gait, diminished heel/toe on R, flexed trunk and knees                                                                                                                                [facility] Adult PT Treatment:                                                DATE: 04/23/24 Therapeutic Exercise: Nustep L4 x 10 min UEs/LEs Supine feet on pball knee flex/ext 2x10 Supine feet on pball knees extended glute iso 2x10 Supine clamshell red TB x10 Supine hip  flexion AAROM with PT assist x10 Neuromuscular re-ed: Supine anterior/posterior pelvic tilt 2x10 Supine quad set 2x10 Manual Therapy: R hip PROM for joint mobility Grade II R hip mobs for flexion Gentle LAD with hip abd     PATIENT EDUCATION:  Education details: Exam findings, POC, initial HEP Person educated: Patient Education method: Explanation, Demonstration, and Handouts Education comprehension: verbalized understanding, returned demonstration, and needs further education  HOME EXERCISE PROGRAM: Access Code: [email or web address] URL: [email or web address] Date: 04/23/2024 Prepared by: Arcadia Gorgas April Marie Ulice Follett  Exercises - Seated Heel Slide  - 1 x daily - 7 x weekly - 2 sets - 10 reps - Seated Quad Set  - 1 x daily - 7 x weekly - 2 sets - 10 reps - 3 sec hold - Seated Hip Abduction with Resistance  - 2 x daily - 7 x weekly - 1 sets - 10 reps - 3 sec hold - Seated Hip Flexion Isometric  - 2 x daily - 7 x weekly - 1 sets - 10 reps - 3 sec hold - Seated Active Hip Flexion  - 2 x daily - 7 x weekly - 1 sets - 10 reps - Beginner Bridge  - 2 x daily - 7 x weekly - 1 sets - 10 reps  ASSESSMENT:  CLINICAL IMPRESSION: Continued to work on gross hip strength and mobility. Added gentle joint mobs and manual work to improve R femoracetabular hypomobility. Pt is not getting enough posterior and inferior glide currently causing some anterior and medial hip impingement.  From eval: Patient is a 73 y.o. F who was seen today for physical therapy evaluation and treatment for R ORIF on 12/06/23. PMH is significant for bilat knee replacement, prior R THA, bilat LE neuropathy and lumbar surgery in January 2025. Assessment is significant for decreased hip and knee ROM, highly weakened quad and hip musculature and FOF affecting pt's home and community mobility. Pt is considered a high fall risk based on her 5x STS. Pt will benefit from PT to address these issues to maximize her level of  function.   OBJECTIVE IMPAIRMENTS: Abnormal gait, decreased activity tolerance, decreased balance, decreased coordination, decreased endurance, decreased mobility, difficulty walking, decreased ROM, decreased strength, hypomobility, increased fascial restrictions, impaired flexibility, improper body mechanics, postural dysfunction, and pain.   ACTIVITY LIMITATIONS: carrying, lifting, bending, sitting, standing, squatting, sleeping, stairs, transfers, bed mobility, dressing, hygiene/grooming, and locomotion level  PARTICIPATION LIMITATIONS: meal prep, cleaning, driving, shopping, and community activity  PERSONAL FACTORS: Age, Fitness, Past/current experiences, and Time since onset of injury/illness/exacerbation are also affecting patient's functional outcome.   REHAB POTENTIAL: Good  CLINICAL DECISION MAKING: Evolving/moderate complexity  EVALUATION COMPLEXITY: Moderate   GOALS: Goals reviewed with patient? Yes  SHORT TERM GOALS: Target date: 05/13/2024  Pt will be ind with initial HEP Baseline: Goal status: INITIAL  2.  Pt will have improved hip flexion to at least 110 deg AROM Baseline:  Goal status: INITIAL  3.  Pt will report improved medial thigh/knee pain by >/=25% Baseline:  Goal status: INITIAL   LONG TERM GOALS: Target date: 06/10/2024   Pt will be ind with management and progression of HEP Baseline:  Goal status: INITIAL  2.  Pt will be able to amb at least 400' with LRAD for increased home and limited community mobility Baseline:  Goal status: INITIAL  3.  Pt will have improved 5x STS to </=20 sec to reduce her fall risk and increase functional LE strength Baseline:  Goal status: INITIAL  4.  Pt will have improved R knee ext to within at least 10 deg of full extension to demo increased quad strength Baseline: 32 deg away from full ext Goal status: INITIAL  5.  Pt will have improved LEFS score to >/=42/80 to demo MCID Baseline: 33/80 Goal status:  INITIAL    PLAN:  PT FREQUENCY: 2x/week  PT DURATION: 8 weeks  PLANNED INTERVENTIONS: 97164- PT Re-evaluation, 97750- Physical Performance Testing, 97110-Therapeutic exercises, 97530- Therapeutic activity, 97112- Neuromuscular re-education, 97535- Self Care, [id number]- Manual therapy, [id number]- Gait training, [phone]- Aquatic Therapy, [phone]- Electrical stimulation (unattended), 97016- Vasopneumatic device, [id number]- Ultrasound, [id number]- Ionotophoresis 4mg /ml Dexamethasone , 79439 (1-2 muscles), 20561 (3+ muscles)- Dry Needling, Patient/Family education, Balance training, Stair training, Taping, Joint mobilization, Spinal mobilization, Vestibular training, Cryotherapy, and Moist heat  PLAN FOR NEXT SESSION: Assess response to HEP. Quad/hip flexor strengthening. Hip mobility and strengthening. Add standing exercises with ankle weights (pt has some at home and wants to know exercises). Balance and gait training.    Kamarie Palma April Ma L Konner Warrior, PT, DPT 04/23/2024, 8:51 AM

## 2024-04-29 ENCOUNTER — Encounter: Payer: Self-pay | Admitting: Physical Therapy

## 2024-04-29 ENCOUNTER — Ambulatory Visit: Attending: Student | Admitting: Physical Therapy

## 2024-04-29 DIAGNOSIS — M79604 Pain in right leg: Secondary | ICD-10-CM | POA: Insufficient documentation

## 2024-04-29 DIAGNOSIS — M6281 Muscle weakness (generalized): Secondary | ICD-10-CM | POA: Insufficient documentation

## 2024-04-29 DIAGNOSIS — R2689 Other abnormalities of gait and mobility: Secondary | ICD-10-CM | POA: Insufficient documentation

## 2024-04-29 DIAGNOSIS — M25551 Pain in right hip: Secondary | ICD-10-CM | POA: Diagnosis not present

## 2024-04-29 NOTE — Therapy (Signed)
 OUTPATIENT PHYSICAL THERAPY TREATMENT   Patient Name: Renee Watts MRN: [id number] DOB:12-22-1950, 73 y.o., female Today's Date: 04/29/2024  END OF SESSION:  PT End of Session - 04/29/24 0912     Visit Number 4    Number of Visits 16    Date for PT Re-Evaluation 06/10/24    Authorization Type [employer]    Progress Note Due on Visit 10    PT Start Time 0915    PT Stop Time 0955    PT Time Calculation (min) 40 min    Activity Tolerance Patient tolerated treatment well    Behavior During Therapy [facility] for tasks assessed/performed           Past Medical History:  Diagnosis Date   Allergy    Benign paroxysmal positional vertigo 06/25/2013   dx with prior PCP, s/p labs, mri and vestibular rehab; nasal spray for etd seemed to help   Blood in stool 03/11/2012   Dependent edema    Bilateral   Depression    GERD (gastroesophageal reflux disease)    on meds   Hyperlipidemia    on meds   Low back pain    sciatica; sees orthopedic specialist ([facility])for this and shoulder tendinitis   Obesity, diabetes, and hypertension syndrome (HCC)    on med   Osteoarthritis, knee    Bilateral, s/p 3 total knee replacement surgerues on right, last 2005. Dr. Duwayne   Pellegrini-Steida syndrome    Myositis ossificans   Seasonal allergies    Shingles 07/10/2012   LESIONS CLEARED BUT NOT TOLERATING ANY THING TIGHT AGAINST BODY--SHINGLES WERE AROSS BACK AND ABDOMEN   Past Surgical History:  Procedure Laterality Date   ABDOMINAL HYSTERECTOMY  1984   BLADDER SURGERY  03/2017   bladder tac per pt   COLONOSCOPY  2020   MS-MAC-goly (good)-hems/TAx5)   left shoulder steroid injection     ORIF FEMUR FRACTURE Right 12/06/2023   Procedure: OPEN REDUCTION INTERNAL FIXATION (ORIF) DISTAL FEMUR FRACTURE;  Surgeon: Kendal Franky SQUIBB, MD;  Location: [facility] OR;  Service: Orthopedics;  Laterality: Right;   POLYPECTOMY  2020   TA x 5   TONSILLECTOMY  1977   TOTAL HIP ARTHROPLASTY Right  10/10/2012   Procedure: R TOTAL HIP ARTHROPLASTY ANTERIOR APPROACH;  Surgeon: Lonni CINDERELLA Poli, MD;  Location: [facility];  Service: Orthopedics;  Laterality: Right;  Right Total Hip Arthroplasty, Anterior Approach (C-Arm)   TOTAL KNEE ARTHROPLASTY Right    with multiple revisions, 2003, 2005   TOTAL KNEE ARTHROPLASTY Left 01/09/2013   Procedure: LEFT TOTAL KNEE ARTHROPLASTY;  Surgeon: Lonni CINDERELLA Poli, MD;  Location: [facility];  Service: Orthopedics;  Laterality: Left;   TUBAL LIGATION  1978   WISDOM TOOTH EXTRACTION  2013,2014   Patient Active Problem List   Diagnosis Date Noted   Closed fracture of distal end of right femur, unspecified fracture morphology, initial encounter (HCC) 12/05/2023   Lumbar radiculopathy 09/17/2023   Osteopenia 02/15/2021   Venous stasis of both lower extremities 06/27/2020   Bilateral lower extremity edema 02/05/2020   Recurrent sinusitis 09/03/2018   Vaginal vault prolapse after hysterectomy 02/13/2017   Obesity 01/06/2016   Low back pain 01/06/2016   Normocytic anemia 03/12/2013   Diabetic peripheral neuropathy (HCC) 09/01/2012   Hyperlipemia 09/16/2006   Hypertension associated with diabetes (HCC) 09/16/2006   GERD 09/16/2006   Type II diabetes mellitus with neurological manifestations (HCC) 06/17/2006   Osteoarthritis 06/17/2006   DEPENDENT EDEMA, LEGS, BILATERAL 06/17/2006  PCP: Mercer Clotilda SAUNDERS, MD  REFERRING PROVIDER: Kendal Franky SQUIBB, MD  REFERRING DIAG: ORIF of right distal fracture performed on 12/06/23  THERAPY DIAG:  Pain in right leg  Pain in right hip  Muscle weakness (generalized)  Other abnormalities of gait and mobility  Rationale for Evaluation and Treatment: Rehabilitation  ONSET DATE: 12/06/23  SUBJECTIVE:   SUBJECTIVE STATEMENT: Reports more stiffness than pain today. States she was sore and fatigued after last session.   From eval: Pt states she has been doing home health PT since her 12/06/23 hip surgery.  Was d/c-ed ~1 month ago. Was able to walk 500 feet with PT. Pt reports pain is more in the inside of her medial thigh. No pain where the incision is laterally. Has been using quad cane intermittently in the house. Pt is fearful of another fall. Pt notes she gets fatigued with prolonged standing but not limited by pain.   PERTINENT HISTORY: L3/4 laminectomy and PSIF on 09/17/23; neuropathy in legs, bilat knee replacement, R hip replacement   PAIN:  Are you having pain? Yes: NPRS scale: 0 currently; at worst 10/10 Pain location: R medial thigh Pain description: sometimes sharp or achy Aggravating factors: Rolling in bed, stepping wrong, getting up quickly Relieving factors: hydrocodone , ice/heat  PRECAUTIONS: Fall  RED FLAGS: None   WEIGHT BEARING RESTRICTIONS: WBAT with no ROM restrictions per 12/06/23 surgery note  FALLS:  Has patient fallen in last 6 months? Yes. Number of falls 1  -- 12/05/23 resulting in her femur fracture  LIVING ENVIRONMENT: Lives with: lives with their spouse; children and grandchildren are in and out Lives in: House/apartment Stairs: 1 step up to enter Has following equipment at home: Single point cane, Quad cane small base, Walker - 2 wheeled, Grab bars, and walk in shower  OCCUPATION: Retired - loves to [employer] and entertainment  PLOF: Independent  PATIENT GOALS: Return to cooking and normal activities without having to stop and rest  NEXT MD VISIT: 06/04/24 to see PCP  OBJECTIVE:  Note: Objective measures were completed at Evaluation unless otherwise noted.  DIAGNOSTIC FINDINGS: X-rays of the lumbar spine from 03/09/2024 were independently reviewed and interpreted, showing laminectomy defect at L3/4.  Spondylolisthesis at L3/4 that does not shift between flexion and extension views.  Posterior instrumentation at L3 and L4.  No lucency seen around the screws and none of them have backed out.  No fracture or dislocation seen.   PATIENT SURVEYS:  Lower  Extremity Functional Score: 33 / 80 = 41.3 %  COGNITION: Overall cognitive status: Within functional limits for tasks assessed     SENSATION: Neuropathy in bilat LEs -- knee to ankle  EDEMA:  Will swell if legs are down  MUSCLE LENGTH: Hamstrings: See knee AROM below  POSTURE: flexed trunk , weight shift left, and knees flexed  PALPATION: TTP medial knee/adductors  LOWER EXTREMITY ROM:  Active ROM Right eval Left eval  Hip flexion sitting 95 PROM WNL in sup At least 110 deg  Hip extension    Hip abduction    Hip adduction    Hip internal rotation    Hip external rotation    Knee flexion sitting 80 110  Knee extension sitting -32 PROM can get -10 in sup -10  Ankle dorsiflexion    Ankle plantarflexion    Ankle inversion    Ankle eversion     (Blank rows = not tested)  LOWER EXTREMITY MMT:   MMT Right eval Left eval  Hip flexion  3- 3+  Hip extension    Hip abduction supine 3+ 3+  Hip adduction    Hip internal rotation    Hip external rotation    Knee flexion 4 4  Knee extension 3- 3+  Ankle dorsiflexion    Ankle plantarflexion    Ankle inversion    Ankle eversion     (Blank rows = not tested)  LOWER EXTREMITY SPECIAL TESTS:  Hip special tests: Belvie (FABER) test: positive  and Thomas test: positive   FUNCTIONAL TESTS:  5 times sit to stand: with UE support 49.86 sec  GAIT: Distance walked: Into clinic Assistive device utilized: Environmental consultant - 2 wheeled Level of assistance: Modified independence Comments: Diminished swing phase on R, bilat steppage gait, diminished heel/toe on R, flexed trunk and knees     [facility] Adult PT Treatment:                                                DATE: 04/29/2024  Therapeutic Exercise: NuStep 8' Therapeutic Activity: Supine marching 2x12B, hold 2s Supine assisted bridge  2x12, 3s hold Ue a. using transfer board Supine bent knee fallout 2x12B, hold 2s, RTB Seated  adduction w/ball 1x15, hold 3s Supine assisted  FAQ 1x15, hold 3s, strap around R foot for UE a.                                                                                                                             [facility] Adult PT Treatment:                                                DATE: 04/23/24 Therapeutic Exercise: Nustep L4 x 10 min UEs/LEs Supine feet on pball knee flex/ext 2x10 Supine feet on pball knees extended glute iso 2x10 Supine clamshell red TB x10 Supine hip flexion AAROM with PT assist x10 Neuromuscular re-ed: Supine anterior/posterior pelvic tilt 2x10 Supine quad set 2x10 Manual Therapy: R hip PROM for joint mobility Grade II R hip mobs for flexion Gentle LAD with hip abd     PATIENT EDUCATION:  Education details: Exam findings, POC, initial HEP Person educated: Patient Education method: Explanation, Demonstration, and Handouts Education comprehension: verbalized understanding, returned demonstration, and needs further education  HOME EXERCISE PROGRAM: Access Code: [email or web address] URL: [email or web address] Date: 04/23/2024 Prepared by: Gellen April Marie Nonato  Exercises - Seated Heel Slide  - 1 x daily - 7 x weekly - 2 sets - 10 reps - Seated Quad Set  - 1 x daily - 7 x weekly - 2 sets - 10 reps - 3 sec hold - Seated Hip Abduction with Resistance  - 2 x daily - 7 x weekly -  1 sets - 10 reps - 3 sec hold - Seated Hip Flexion Isometric  - 2 x daily - 7 x weekly - 1 sets - 10 reps - 3 sec hold - Seated Active Hip Flexion  - 2 x daily - 7 x weekly - 1 sets - 10 reps - Beginner Bridge  - 2 x daily - 7 x weekly - 1 sets - 10 reps  ASSESSMENT:  CLINICAL IMPRESSION: Pt attended physical therapy session for continuation of treatment regarding R hip ORIF. Today's treatment focused on improvement of  proximal hip strength, functional glute activation patterns, AROM, and activity tolerance. Pt is progressing with active hip flexion, able to perform supine marching on R side up to 90d of  active  hip flexion Pt showed great tolerance to administered treatment with no adverse effects by the end of session. Skilled intervention was utilized via activity modification for pt tolerance with task completion, functional progression/regression promoting best outcomes inline with current rehab goals, as well as minimal verbal/tactile cuing alongside no physical assistance for safe and appropriate performance of today's activities. Continue with therapeutic focus on global AROM, joint mobility, proximal hip strengthening and gait mechanics, progress HEP as indicated.   From eval: Patient is a 73 y.o. F who was seen today for physical therapy evaluation and treatment for R ORIF on 12/06/23. PMH is significant for bilat knee replacement, prior R THA, bilat LE neuropathy and lumbar surgery in January 2025. Assessment is significant for decreased hip and knee ROM, highly weakened quad and hip musculature and FOF affecting pt's home and community mobility. Pt is considered a high fall risk based on her 5x STS. Pt will benefit from PT to address these issues to maximize her level of function.   OBJECTIVE IMPAIRMENTS: Abnormal gait, decreased activity tolerance, decreased balance, decreased coordination, decreased endurance, decreased mobility, difficulty walking, decreased ROM, decreased strength, hypomobility, increased fascial restrictions, impaired flexibility, improper body mechanics, postural dysfunction, and pain.   ACTIVITY LIMITATIONS: carrying, lifting, bending, sitting, standing, squatting, sleeping, stairs, transfers, bed mobility, dressing, hygiene/grooming, and locomotion level  PARTICIPATION LIMITATIONS: meal prep, cleaning, driving, shopping, and community activity  PERSONAL FACTORS: Age, Fitness, Past/current experiences, and Time since onset of injury/illness/exacerbation are also affecting patient's functional outcome.   REHAB POTENTIAL: Good  CLINICAL DECISION MAKING: Evolving/moderate  complexity  EVALUATION COMPLEXITY: Moderate   GOALS: Goals reviewed with patient? Yes  SHORT TERM GOALS: Target date: 05/13/2024  Pt will be ind with initial HEP Baseline: Goal status: INITIAL  2.  Pt will have improved hip flexion to at least 110 deg AROM Baseline:  Goal status: INITIAL  3.  Pt will report improved medial thigh/knee pain by >/=25% Baseline:  Goal status: INITIAL   LONG TERM GOALS: Target date: 06/10/2024   Pt will be ind with management and progression of HEP Baseline:  Goal status: INITIAL  2.  Pt will be able to amb at least 400' with LRAD for increased home and limited community mobility Baseline:  Goal status: INITIAL  3.  Pt will have improved 5x STS to </=20 sec to reduce her fall risk and increase functional LE strength Baseline:  Goal status: INITIAL  4.  Pt will have improved R knee ext to within at least 10 deg of full extension to demo increased quad strength Baseline: 32 deg away from full ext Goal status: INITIAL  5.  Pt will have improved LEFS score to >/=42/80 to demo MCID Baseline: 33/80 Goal status: INITIAL  PLAN:  PT FREQUENCY: 2x/week  PT DURATION: 8 weeks  PLANNED INTERVENTIONS: 97164- PT Re-evaluation, 97750- Physical Performance Testing, 97110-Therapeutic exercises, 97530- Therapeutic activity, 97112- Neuromuscular re-education, 97535- Self Care, [id number]- Manual therapy, [phone]- Gait training, [phone]- Aquatic Therapy, [phone]- Electrical stimulation (unattended), 97016- Vasopneumatic device, [id number]- Ultrasound, [id number]- Ionotophoresis 4mg /ml Dexamethasone , 79439 (1-2 muscles), [id number] (3+ muscles)- Dry Needling, Patient/Family education, Balance training, Stair training, Taping, Joint mobilization, Spinal mobilization, Vestibular training, Cryotherapy, and Moist heat  PLAN FOR NEXT SESSION: Continue with therapeutic focus on global AROM, joint mobility, proximal hip strengthening and gait mechanics, progress HEP as  indicated.   Mabel JONELLE Kiang, PT, DPT 04/29/2024, 9:56 AM

## 2024-05-01 ENCOUNTER — Ambulatory Visit

## 2024-05-01 DIAGNOSIS — M6281 Muscle weakness (generalized): Secondary | ICD-10-CM

## 2024-05-01 DIAGNOSIS — M79604 Pain in right leg: Secondary | ICD-10-CM | POA: Diagnosis not present

## 2024-05-01 DIAGNOSIS — M25551 Pain in right hip: Secondary | ICD-10-CM

## 2024-05-01 DIAGNOSIS — R2689 Other abnormalities of gait and mobility: Secondary | ICD-10-CM

## 2024-05-01 NOTE — Therapy (Signed)
 OUTPATIENT PHYSICAL THERAPY TREATMENT   Patient Name: Renee Watts MRN: [id number] DOB:05/09/1951, 73 y.o., female Today's Date: 05/01/2024  END OF SESSION:  PT End of Session - 05/01/24 0910     Visit Number 5    Number of Visits 16    Date for PT Re-Evaluation 06/10/24    Authorization Type [employer]    Progress Note Due on Visit 10    PT Start Time 0915    PT Stop Time 0956    PT Time Calculation (min) 41 min    Activity Tolerance Patient tolerated treatment well    Behavior During Therapy [facility] for tasks assessed/performed         Past Medical History:  Diagnosis Date   Allergy    Benign paroxysmal positional vertigo 06/25/2013   dx with prior PCP, s/p labs, mri and vestibular rehab; nasal spray for etd seemed to help   Blood in stool 03/11/2012   Dependent edema    Bilateral   Depression    GERD (gastroesophageal reflux disease)    on meds   Hyperlipidemia    on meds   Low back pain    sciatica; sees orthopedic specialist ([facility])for this and shoulder tendinitis   Obesity, diabetes, and hypertension syndrome (HCC)    on med   Osteoarthritis, knee    Bilateral, s/p 3 total knee replacement surgerues on right, last 2005. Dr. Duwayne   Pellegrini-Steida syndrome    Myositis ossificans   Seasonal allergies    Shingles 07/10/2012   LESIONS CLEARED BUT NOT TOLERATING ANY THING TIGHT AGAINST BODY--SHINGLES WERE AROSS BACK AND ABDOMEN   Past Surgical History:  Procedure Laterality Date   ABDOMINAL HYSTERECTOMY  1984   BLADDER SURGERY  03/2017   bladder tac per pt   COLONOSCOPY  2020   MS-MAC-goly (good)-hems/TAx5)   left shoulder steroid injection     ORIF FEMUR FRACTURE Right 12/06/2023   Procedure: OPEN REDUCTION INTERNAL FIXATION (ORIF) DISTAL FEMUR FRACTURE;  Surgeon: Kendal Franky SQUIBB, MD;  Location: [facility] OR;  Service: Orthopedics;  Laterality: Right;   POLYPECTOMY  2020   TA x 5   TONSILLECTOMY  1977   TOTAL HIP ARTHROPLASTY Right  10/10/2012   Procedure: R TOTAL HIP ARTHROPLASTY ANTERIOR APPROACH;  Surgeon: Lonni CINDERELLA Poli, MD;  Location: [facility];  Service: Orthopedics;  Laterality: Right;  Right Total Hip Arthroplasty, Anterior Approach (C-Arm)   TOTAL KNEE ARTHROPLASTY Right    with multiple revisions, 2003, 2005   TOTAL KNEE ARTHROPLASTY Left 01/09/2013   Procedure: LEFT TOTAL KNEE ARTHROPLASTY;  Surgeon: Lonni CINDERELLA Poli, MD;  Location: [facility];  Service: Orthopedics;  Laterality: Left;   TUBAL LIGATION  1978   WISDOM TOOTH EXTRACTION  2013,2014   Patient Active Problem List   Diagnosis Date Noted   Closed fracture of distal end of right femur, unspecified fracture morphology, initial encounter (HCC) 12/05/2023   Lumbar radiculopathy 09/17/2023   Osteopenia 02/15/2021   Venous stasis of both lower extremities 06/27/2020   Bilateral lower extremity edema 02/05/2020   Recurrent sinusitis 09/03/2018   Vaginal vault prolapse after hysterectomy 02/13/2017   Obesity 01/06/2016   Low back pain 01/06/2016   Normocytic anemia 03/12/2013   Diabetic peripheral neuropathy (HCC) 09/01/2012   Hyperlipemia 09/16/2006   Hypertension associated with diabetes (HCC) 09/16/2006   GERD 09/16/2006   Type II diabetes mellitus with neurological manifestations (HCC) 06/17/2006   Osteoarthritis 06/17/2006   DEPENDENT EDEMA, LEGS, BILATERAL 06/17/2006    PCP:  Mercer Clotilda SAUNDERS, MD  REFERRING PROVIDER: Kendal Franky SQUIBB, MD  REFERRING DIAG: ORIF of right distal fracture performed on 12/06/23  THERAPY DIAG:  Pain in right leg  Muscle weakness (generalized)  Pain in right hip  Other abnormalities of gait and mobility  Rationale for Evaluation and Treatment: Rehabilitation  ONSET DATE: 12/06/23  SUBJECTIVE:   SUBJECTIVE STATEMENT: Patient reports that she is having increased soreness today due to walking up and down her driveway multiple times yesterday.   From eval: Pt states she has been doing home health PT  since her 12/06/23 hip surgery. Was d/c-ed ~1 month ago. Was able to walk 500 feet with PT. Pt reports pain is more in the inside of her medial thigh. No pain where the incision is laterally. Has been using quad cane intermittently in the house. Pt is fearful of another fall. Pt notes she gets fatigued with prolonged standing but not limited by pain.   PERTINENT HISTORY: L3/4 laminectomy and PSIF on 09/17/23; neuropathy in legs, bilat knee replacement, R hip replacement   PAIN:  Are you having pain? Yes: NPRS scale: 0 currently; at worst 10/10 Pain location: R medial thigh Pain description: sometimes sharp or achy Aggravating factors: Rolling in bed, stepping wrong, getting up quickly Relieving factors: hydrocodone , ice/heat  PRECAUTIONS: Fall  RED FLAGS: None   WEIGHT BEARING RESTRICTIONS: WBAT with no ROM restrictions per 12/06/23 surgery note  FALLS:  Has patient fallen in last 6 months? Yes. Number of falls 1  -- 12/05/23 resulting in her femur fracture  LIVING ENVIRONMENT: Lives with: lives with their spouse; children and grandchildren are in and out Lives in: House/apartment Stairs: 1 step up to enter Has following equipment at home: Single point cane, Quad cane small base, Walker - 2 wheeled, Grab bars, and walk in shower  OCCUPATION: Retired - loves to [employer] and entertainment  PLOF: Independent  PATIENT GOALS: Return to cooking and normal activities without having to stop and rest  NEXT MD VISIT: 06/04/24 to see PCP  OBJECTIVE:  Note: Objective measures were completed at Evaluation unless otherwise noted.  DIAGNOSTIC FINDINGS: X-rays of the lumbar spine from 03/09/2024 were independently reviewed and interpreted, showing laminectomy defect at L3/4.  Spondylolisthesis at L3/4 that does not shift between flexion and extension views.  Posterior instrumentation at L3 and L4.  No lucency seen around the screws and none of them have backed out.  No fracture or dislocation seen.    PATIENT SURVEYS:  Lower Extremity Functional Score: 33 / 80 = 41.3 %  COGNITION: Overall cognitive status: Within functional limits for tasks assessed     SENSATION: Neuropathy in bilat LEs -- knee to ankle  EDEMA:  Will swell if legs are down  MUSCLE LENGTH: Hamstrings: See knee AROM below  POSTURE: flexed trunk , weight shift left, and knees flexed  PALPATION: TTP medial knee/adductors  LOWER EXTREMITY ROM:  Active ROM Right eval Left eval Right 05/01/24 Left 05/01/24  Hip flexion sitting 95 PROM WNL in sup At least 110 deg 95 supine 110 supine  Hip extension      Hip abduction      Hip adduction      Hip internal rotation      Hip external rotation      Knee flexion sitting 80 110 85 110  Knee extension sitting -32 PROM can get -10 in sup -10 -9 -6  Ankle dorsiflexion      Ankle plantarflexion  Ankle inversion      Ankle eversion       (Blank rows = not tested)  LOWER EXTREMITY MMT:   MMT Right eval Left eval  Hip flexion 3- 3+  Hip extension    Hip abduction supine 3+ 3+  Hip adduction    Hip internal rotation    Hip external rotation    Knee flexion 4 4  Knee extension 3- 3+  Ankle dorsiflexion    Ankle plantarflexion    Ankle inversion    Ankle eversion     (Blank rows = not tested)  LOWER EXTREMITY SPECIAL TESTS:  Hip special tests: Belvie (FABER) test: positive  and Thomas test: positive   FUNCTIONAL TESTS:  5 times sit to stand: with UE support 49.86 sec  GAIT: Distance walked: Into clinic Assistive device utilized: Environmental consultant - 2 wheeled Level of assistance: Modified independence Comments: Diminished swing phase on R, bilat steppage gait, diminished heel/toe on R, flexed trunk and knees  [facility] Adult PT Treatment:                                                DATE: 05/01/24 Therapeutic Exercise: NuStep level 5 x 8' while gathering subjective and planning session with patient  Seated LAQ BIL 2x10 Supine DKTC heels on red pball  x10 Therapeutic Activity: Supine bent knee fallout 2x15B, hold 2s, RTB Seated adduction w/ball 1x15, hold 3s    [facility] Adult PT Treatment:                                                DATE: 04/29/2024  Therapeutic Exercise: NuStep 8' Therapeutic Activity: Supine marching 2x12B, hold 2s Supine assisted bridge  2x12, 3s hold Ue a. using transfer board Supine bent knee fallout 2x12B, hold 2s, RTB Seated  adduction w/ball 1x15, hold 3s Supine assisted FAQ 1x15, hold 3s, strap around R foot for UE a.                                                                                                                            [facility] Adult PT Treatment:                                                DATE: 04/23/24 Therapeutic Exercise: Nustep L4 x 10 min UEs/LEs Supine feet on pball knee flex/ext 2x10 Supine feet on pball knees extended glute iso 2x10 Supine clamshell red TB x10 Supine hip flexion AAROM with PT assist x10 Neuromuscular re-ed: Supine anterior/posterior pelvic tilt 2x10 Supine quad set 2x10 Manual  Therapy: R hip PROM for joint mobility Grade II R hip mobs for flexion Gentle LAD with hip abd    PATIENT EDUCATION:  Education details: Exam findings, POC, initial HEP Person educated: Patient Education method: Explanation, Demonstration, and Handouts Education comprehension: verbalized understanding, returned demonstration, and needs further education  HOME EXERCISE PROGRAM: Access Code: [email or web address] URL: [email or web address] Date: 04/23/2024 Prepared by: Gellen April Marie Nonato  Exercises - Seated Heel Slide  - 1 x daily - 7 x weekly - 2 sets - 10 reps - Seated Quad Set  - 1 x daily - 7 x weekly - 2 sets - 10 reps - 3 sec hold - Seated Hip Abduction with Resistance  - 2 x daily - 7 x weekly - 1 sets - 10 reps - 3 sec hold - Seated Hip Flexion Isometric  - 2 x daily - 7 x weekly - 1 sets - 10 reps - 3 sec hold - Seated Active Hip Flexion  - 2 x daily  - 7 x weekly - 1 sets - 10 reps - Beginner Bridge  - 2 x daily - 7 x weekly - 1 sets - 10 reps  ASSESSMENT:  CLINICAL IMPRESSION: Patient presents to PT reporting increased soreness in her legs from walking up and down her driveway for 10 minutes yesterday, first time she has done this in several months since it has been too hot outside. Session today continued to focus on proximal hip and knee strengthening and ROM. She is showing improvements in her ROM, especially in her knee, see above chart. Patient was able to tolerate all prescribed exercises with no adverse effects. Patient continues to benefit from skilled PT services and should be progressed as able to improve functional independence.   From eval: Patient is a 73 y.o. F who was seen today for physical therapy evaluation and treatment for R ORIF on 12/06/23. PMH is significant for bilat knee replacement, prior R THA, bilat LE neuropathy and lumbar surgery in January 2025. Assessment is significant for decreased hip and knee ROM, highly weakened quad and hip musculature and FOF affecting pt's home and community mobility. Pt is considered a high fall risk based on her 5x STS. Pt will benefit from PT to address these issues to maximize her level of function.   OBJECTIVE IMPAIRMENTS: Abnormal gait, decreased activity tolerance, decreased balance, decreased coordination, decreased endurance, decreased mobility, difficulty walking, decreased ROM, decreased strength, hypomobility, increased fascial restrictions, impaired flexibility, improper body mechanics, postural dysfunction, and pain.   ACTIVITY LIMITATIONS: carrying, lifting, bending, sitting, standing, squatting, sleeping, stairs, transfers, bed mobility, dressing, hygiene/grooming, and locomotion level  PARTICIPATION LIMITATIONS: meal prep, cleaning, driving, shopping, and community activity  PERSONAL FACTORS: Age, Fitness, Past/current experiences, and Time since onset of  injury/illness/exacerbation are also affecting patient's functional outcome.   REHAB POTENTIAL: Good  CLINICAL DECISION MAKING: Evolving/moderate complexity  EVALUATION COMPLEXITY: Moderate   GOALS: Goals reviewed with patient? Yes  SHORT TERM GOALS: Target date: 05/13/2024  Pt will be ind with initial HEP Baseline: Goal status: INITIAL  2.  Pt will have improved hip flexion to at least 110 deg AROM Baseline:  Goal status: INITIAL  3.  Pt will report improved medial thigh/knee pain by >/=25% Baseline:  Goal status: INITIAL   LONG TERM GOALS: Target date: 06/10/2024   Pt will be ind with management and progression of HEP Baseline:  Goal status: INITIAL  2.  Pt will be able to amb at least 400' with LRAD for increased  home and limited community mobility Baseline:  Goal status: INITIAL  3.  Pt will have improved 5x STS to </=20 sec to reduce her fall risk and increase functional LE strength Baseline:  Goal status: INITIAL  4.  Pt will have improved R knee ext to within at least 10 deg of full extension to demo increased quad strength Baseline: 32 deg away from full ext Goal status: INITIAL  5.  Pt will have improved LEFS score to >/=42/80 to demo MCID Baseline: 33/80 Goal status: INITIAL   PLAN:  PT FREQUENCY: 2x/week  PT DURATION: 8 weeks  PLANNED INTERVENTIONS: 97164- PT Re-evaluation, 97750- Physical Performance Testing, 97110-Therapeutic exercises, 97530- Therapeutic activity, 97112- Neuromuscular re-education, 97535- Self Care, [id number]- Manual therapy, [id number]- Gait training, [phone]- Aquatic Therapy, [phone]- Electrical stimulation (unattended), 97016- Vasopneumatic device, [id number]- Ultrasound, [id number]- Ionotophoresis 4mg /ml Dexamethasone , 79439 (1-2 muscles), 20561 (3+ muscles)- Dry Needling, Patient/Family education, Balance training, Stair training, Taping, Joint mobilization, Spinal mobilization, Vestibular training, Cryotherapy, and Moist heat  PLAN FOR NEXT  SESSION: Continue with therapeutic focus on global AROM, joint mobility, proximal hip strengthening and gait mechanics, progress HEP as indicated.   Corean Pouch, PTA 05/01/2024, 9:58 AM

## 2024-05-06 ENCOUNTER — Ambulatory Visit: Admitting: Physical Therapy

## 2024-05-06 ENCOUNTER — Encounter: Payer: Self-pay | Admitting: Physical Therapy

## 2024-05-06 DIAGNOSIS — M6281 Muscle weakness (generalized): Secondary | ICD-10-CM

## 2024-05-06 DIAGNOSIS — M25551 Pain in right hip: Secondary | ICD-10-CM

## 2024-05-06 DIAGNOSIS — R2689 Other abnormalities of gait and mobility: Secondary | ICD-10-CM

## 2024-05-06 DIAGNOSIS — M79604 Pain in right leg: Secondary | ICD-10-CM | POA: Diagnosis not present

## 2024-05-06 NOTE — Therapy (Signed)
 OUTPATIENT PHYSICAL THERAPY TREATMENT   Patient Name: Renee Watts MRN: [id number] DOB:[birth year], 73 y.o., female Today's Date: 05/06/2024  END OF SESSION:  PT End of Session - 05/06/24 0911     Visit Number 6    Number of Visits 16    Date for PT Re-Evaluation 06/10/24    Authorization Type [employer]    Progress Note Due on Visit 10    PT Start Time 0910    PT Stop Time 0950    PT Time Calculation (min) 40 min    Activity Tolerance Patient tolerated treatment well    Behavior During Therapy [employer] for tasks assessed/performed          Past Medical History:  Diagnosis Date   Allergy    Benign paroxysmal positional vertigo 06/25/2013   dx with prior PCP, s/p labs, mri and vestibular rehab; nasal spray for etd seemed to help   Blood in stool 03/11/2012   Dependent edema    Bilateral   Depression    GERD (gastroesophageal reflux disease)    on meds   Hyperlipidemia    on meds   Low back pain    sciatica; sees orthopedic specialist ([facility])for this and shoulder tendinitis   Obesity, diabetes, and hypertension syndrome (HCC)    on med   Osteoarthritis, knee    Bilateral, s/p 3 total knee replacement surgerues on right, last 2005. Dr. Duwayne   Pellegrini-Steida syndrome    Myositis ossificans   Seasonal allergies    Shingles 07/10/2012   LESIONS CLEARED BUT NOT TOLERATING ANY THING TIGHT AGAINST BODY--SHINGLES WERE AROSS BACK AND ABDOMEN   Past Surgical History:  Procedure Laterality Date   ABDOMINAL HYSTERECTOMY  1984   BLADDER SURGERY  03/2017   bladder tac per pt   COLONOSCOPY  2020   MS-MAC-goly (good)-hems/TAx5)   left shoulder steroid injection     ORIF FEMUR FRACTURE Right 12/06/2023   Procedure: OPEN REDUCTION INTERNAL FIXATION (ORIF) DISTAL FEMUR FRACTURE;  Surgeon: Kendal Franky SQUIBB, MD;  Location: [facility] OR;  Service: Orthopedics;  Laterality: Right;   POLYPECTOMY  2020   TA x 5   TONSILLECTOMY  1977   TOTAL HIP ARTHROPLASTY Right  10/10/2012   Procedure: R TOTAL HIP ARTHROPLASTY ANTERIOR APPROACH;  Surgeon: Lonni CINDERELLA Poli, MD;  Location: [facility];  Service: Orthopedics;  Laterality: Right;  Right Total Hip Arthroplasty, Anterior Approach (C-Arm)   TOTAL KNEE ARTHROPLASTY Right    with multiple revisions, 2003, 2005   TOTAL KNEE ARTHROPLASTY Left 01/09/2013   Procedure: LEFT TOTAL KNEE ARTHROPLASTY;  Surgeon: Lonni CINDERELLA Poli, MD;  Location: [facility];  Service: Orthopedics;  Laterality: Left;   TUBAL LIGATION  1978   WISDOM TOOTH EXTRACTION  2013,2014   Patient Active Problem List   Diagnosis Date Noted   Closed fracture of distal end of right femur, unspecified fracture morphology, initial encounter (HCC) 12/05/2023   Lumbar radiculopathy 09/17/2023   Osteopenia 02/15/2021   Venous stasis of both lower extremities 06/27/2020   Bilateral lower extremity edema 02/05/2020   Recurrent sinusitis 09/03/2018   Vaginal vault prolapse after hysterectomy 02/13/2017   Obesity 01/06/2016   Low back pain 01/06/2016   Normocytic anemia 03/12/2013   Diabetic peripheral neuropathy (HCC) 09/01/2012   Hyperlipemia 09/16/2006   Hypertension associated with diabetes (HCC) 09/16/2006   GERD 09/16/2006   Type II diabetes mellitus with neurological manifestations (HCC) 06/17/2006   Osteoarthritis 06/17/2006   DEPENDENT EDEMA, LEGS, BILATERAL 06/17/2006  PCP: Mercer Clotilda SAUNDERS, MD  REFERRING PROVIDER: Kendal Franky SQUIBB, MD  REFERRING DIAG: ORIF of right distal fracture performed on 12/06/23  THERAPY DIAG:  Pain in right leg  Muscle weakness (generalized)  Pain in right hip  Other abnormalities of gait and mobility  Rationale for Evaluation and Treatment: Rehabilitation  ONSET DATE: 12/06/23  SUBJECTIVE:   SUBJECTIVE STATEMENT: Patient reports that she is having increased soreness today due to walking up and down her driveway multiple times yesterday.   From eval: Pt states she has been doing home health PT  since her 12/06/23 hip surgery. Was d/c-ed ~1 month ago. Was able to walk 500 feet with PT. Pt reports pain is more in the inside of her medial thigh. No pain where the incision is laterally. Has been using quad cane intermittently in the house. Pt is fearful of another fall. Pt notes she gets fatigued with prolonged standing but not limited by pain.   PERTINENT HISTORY: L3/4 laminectomy and PSIF on 09/17/23; neuropathy in legs, bilat knee replacement, R hip replacement   PAIN:  Are you having pain? Yes: NPRS scale: 0 currently; at worst 10/10 Pain location: R medial thigh Pain description: sometimes sharp or achy Aggravating factors: Rolling in bed, stepping wrong, getting up quickly Relieving factors: hydrocodone , ice/heat  PRECAUTIONS: Fall  RED FLAGS: None   WEIGHT BEARING RESTRICTIONS: WBAT with no ROM restrictions per 12/06/23 surgery note  FALLS:  Has patient fallen in last 6 months? Yes. Number of falls 1  -- 12/05/23 resulting in her femur fracture  LIVING ENVIRONMENT: Lives with: lives with their spouse; children and grandchildren are in and out Lives in: House/apartment Stairs: 1 step up to enter Has following equipment at home: Single point cane, Quad cane small base, Walker - 2 wheeled, Grab bars, and walk in shower  OCCUPATION: Retired - loves to [employer] and entertainment  PLOF: Independent  PATIENT GOALS: Return to cooking and normal activities without having to stop and rest  NEXT MD VISIT: 06/04/24 to see PCP  OBJECTIVE:  Note: Objective measures were completed at Evaluation unless otherwise noted.  DIAGNOSTIC FINDINGS: X-rays of the lumbar spine from 03/09/2024 were independently reviewed and interpreted, showing laminectomy defect at L3/4.  Spondylolisthesis at L3/4 that does not shift between flexion and extension views.  Posterior instrumentation at L3 and L4.  No lucency seen around the screws and none of them have backed out.  No fracture or dislocation seen.    PATIENT SURVEYS:  Lower Extremity Functional Score: 33 / 80 = 41.3 %  COGNITION: Overall cognitive status: Within functional limits for tasks assessed     SENSATION: Neuropathy in bilat LEs -- knee to ankle  EDEMA:  Will swell if legs are down  MUSCLE LENGTH: Hamstrings: See knee AROM below  POSTURE: flexed trunk , weight shift left, and knees flexed  PALPATION: TTP medial knee/adductors  LOWER EXTREMITY ROM:  Active ROM Right eval Left eval Right 05/01/24 Left 05/01/24  Hip flexion sitting 95 PROM WNL in sup At least 110 deg 95 supine 110 supine  Hip extension      Hip abduction      Hip adduction      Hip internal rotation      Hip external rotation      Knee flexion sitting 80 110 85 110  Knee extension sitting -32 PROM can get -10 in sup -10 -9 -6  Ankle dorsiflexion      Ankle plantarflexion  Ankle inversion      Ankle eversion       (Blank rows = not tested)  LOWER EXTREMITY MMT:   MMT Right eval Left eval  Hip flexion 3- 3+  Hip extension    Hip abduction supine 3+ 3+  Hip adduction    Hip internal rotation    Hip external rotation    Knee flexion 4 4  Knee extension 3- 3+  Ankle dorsiflexion    Ankle plantarflexion    Ankle inversion    Ankle eversion     (Blank rows = not tested)  LOWER EXTREMITY SPECIAL TESTS:  Hip special tests: Belvie (FABER) test: positive  and Thomas test: positive   FUNCTIONAL TESTS:  5 times sit to stand: with UE support 49.86 sec  GAIT: Distance walked: Into clinic Assistive device utilized: Environmental consultant - 2 wheeled Level of assistance: Modified independence Comments: Diminished swing phase on R, bilat steppage gait, diminished heel/toe on R, flexed trunk and knees  [facility] Adult PT Treatment:                                                DATE: 05/06/2024  Neuromuscular re-ed: Standing RLE TKE 2x8, 8s hold, RTB Standing march over cone  2x10 B, walker for UE a. (Only L hand when raising L  side) Therapeutic Activity: Standing abduction 2x10 B, hold 2s, RTB, use walker for UE a. Nustep  8' For activity tolerance STS 1x10, cue for RLE foot placement and head hips relationship    [facility] Adult PT Treatment:                                                DATE: 05/01/24 Therapeutic Exercise: NuStep level 5 x 8' while gathering subjective and planning session with patient  Seated LAQ BIL 2x10 Supine DKTC heels on red pball x10 Therapeutic Activity: Supine bent knee fallout 2x15B, hold 2s, RTB Seated adduction w/ball 1x15, hold 3s    [facility] Adult PT Treatment:                                                DATE: 04/29/2024  Therapeutic Exercise: NuStep 8' Therapeutic Activity: Supine marching 2x12B, hold 2s Supine assisted bridge  2x12, 3s hold Ue a. using transfer board Supine bent knee fallout 2x12B, hold 2s, RTB Seated  adduction w/ball 1x15, hold 3s Supine assisted FAQ 1x15, hold 3s, strap around R foot for UE a.   PATIENT EDUCATION:  Education details: Exam findings, POC, initial HEP Person educated: Patient Education method: Explanation, Demonstration, and Handouts Education comprehension: verbalized understanding, returned demonstration, and needs further education  HOME EXERCISE PROGRAM: Access Code: [email or web address] URL: [email or web address] Date: 04/23/2024 Prepared by: Gellen April Marie Nonato  Exercises - Seated Heel Slide  - 1 x daily - 7 x weekly - 2 sets - 10 reps - Seated Quad Set  - 1 x daily - 7 x weekly - 2 sets - 10 reps - 3 sec hold - Seated Hip Abduction with Resistance  - 2 x daily - 7 x weekly - 1 sets -  10 reps - 3 sec hold - Seated Hip Flexion Isometric  - 2 x daily - 7 x weekly - 1 sets - 10 reps - 3 sec hold - Seated Active Hip Flexion  - 2 x daily - 7 x weekly - 1 sets - 10 reps - Beginner Bridge  - 2 x daily - 7 x weekly - 1 sets - 10 reps  ASSESSMENT:  CLINICAL IMPRESSION: Pt attended physical therapy session for  continuation of treatment regarding R ORIF. Today's treatment focused on improvement of  proximal hip strengthening, quadriceps recruitment, standing tolerance and confidence in RLE. Pt showed good tolerance to administered treatment with no adverse effects by the end of session. Skilled intervention was utilized via activity modification for pt tolerance with task completion, functional progression/regression promoting best outcomes inline with current rehab goals, as well as moderate verbal/tactile cuing alongside Contact guard physical assistance for safe and appropriate performance of today's standing activities. Continue with therapeutic focus on standing tolerance, RLE strength, quadriceps recruitment, STS transfers and ambulation quality.   From eval: Patient is a 73 y.o. F who was seen today for physical therapy evaluation and treatment for R ORIF on 12/06/23. PMH is significant for bilat knee replacement, prior R THA, bilat LE neuropathy and lumbar surgery in January 2025. Assessment is significant for decreased hip and knee ROM, highly weakened quad and hip musculature and FOF affecting pt's home and community mobility. Pt is considered a high fall risk based on her 5x STS. Pt will benefit from PT to address these issues to maximize her level of function.   OBJECTIVE IMPAIRMENTS: Abnormal gait, decreased activity tolerance, decreased balance, decreased coordination, decreased endurance, decreased mobility, difficulty walking, decreased ROM, decreased strength, hypomobility, increased fascial restrictions, impaired flexibility, improper body mechanics, postural dysfunction, and pain.   ACTIVITY LIMITATIONS: carrying, lifting, bending, sitting, standing, squatting, sleeping, stairs, transfers, bed mobility, dressing, hygiene/grooming, and locomotion level  PARTICIPATION LIMITATIONS: meal prep, cleaning, driving, shopping, and community activity  PERSONAL FACTORS: Age, Fitness, Past/current  experiences, and Time since onset of injury/illness/exacerbation are also affecting patient's functional outcome.   REHAB POTENTIAL: Good  CLINICAL DECISION MAKING: Evolving/moderate complexity  EVALUATION COMPLEXITY: Moderate   GOALS: Goals reviewed with patient? Yes  SHORT TERM GOALS: Target date: 05/13/2024  Pt will be ind with initial HEP Baseline: Goal status: INITIAL  2.  Pt will have improved hip flexion to at least 110 deg AROM Baseline:  Goal status: INITIAL  3.  Pt will report improved medial thigh/knee pain by >/=25% Baseline:  Goal status: INITIAL   LONG TERM GOALS: Target date: 06/10/2024   Pt will be ind with management and progression of HEP Baseline:  Goal status: INITIAL  2.  Pt will be able to amb at least 400' with LRAD for increased home and limited community mobility Baseline:  Goal status: INITIAL  3.  Pt will have improved 5x STS to </=20 sec to reduce her fall risk and increase functional LE strength Baseline:  Goal status: INITIAL  4.  Pt will have improved R knee ext to within at least 10 deg of full extension to demo increased quad strength Baseline: 32 deg away from full ext Goal status: INITIAL  5.  Pt will have improved LEFS score to >/=42/80 to demo MCID Baseline: 33/80 Goal status: INITIAL   PLAN:  PT FREQUENCY: 2x/week  PT DURATION: 8 weeks  PLANNED INTERVENTIONS: 97164- PT Re-evaluation, 97750- Physical Performance Testing, 97110-Therapeutic exercises, 97530- Therapeutic activity, [id number]- Neuromuscular re-education,  02464- Self Care, 02859- Manual therapy, [id number]- Gait training, [phone]- Aquatic Therapy, [phone]- Electrical stimulation (unattended), 97016- Vasopneumatic device, [id number]- Ultrasound, [id number]- Ionotophoresis 4mg /ml Dexamethasone , 20560 (1-2 muscles), 20561 (3+ muscles)- Dry Needling, Patient/Family education, Balance training, Stair training, Taping, Joint mobilization, Spinal mobilization, Vestibular training, Cryotherapy,  and Moist heat  PLAN FOR NEXT SESSION: Continue with therapeutic focus on standing tolerance, RLE strength, quadriceps recruitment, STS transfers and ambulation quality.    Mabel JONELLE Kiang, PT 05/06/2024, 10:00 AM

## 2024-05-08 ENCOUNTER — Ambulatory Visit

## 2024-05-08 DIAGNOSIS — M79604 Pain in right leg: Secondary | ICD-10-CM | POA: Diagnosis not present

## 2024-05-08 DIAGNOSIS — M25551 Pain in right hip: Secondary | ICD-10-CM

## 2024-05-08 DIAGNOSIS — M6281 Muscle weakness (generalized): Secondary | ICD-10-CM

## 2024-05-08 DIAGNOSIS — R2689 Other abnormalities of gait and mobility: Secondary | ICD-10-CM

## 2024-05-08 NOTE — Therapy (Signed)
 OUTPATIENT PHYSICAL THERAPY TREATMENT   Patient Name: Renee Watts MRN: [id number] DOB:04/08/1951, 73 y.o., female Today's Date: 05/08/2024  END OF SESSION:  PT End of Session - 05/08/24 0912     Visit Number 7    Number of Visits 16    Date for PT Re-Evaluation 06/10/24    Authorization Type [employer]    Progress Note Due on Visit 10    PT Start Time 0915    PT Stop Time 0953    PT Time Calculation (min) 38 min    Activity Tolerance Patient tolerated treatment well    Behavior During Therapy [facility] for tasks assessed/performed          Past Medical History:  Diagnosis Date   Allergy    Benign paroxysmal positional vertigo 06/25/2013   dx with prior PCP, s/p labs, mri and vestibular rehab; nasal spray for etd seemed to help   Blood in stool 03/11/2012   Dependent edema    Bilateral   Depression    GERD (gastroesophageal reflux disease)    on meds   Hyperlipidemia    on meds   Low back pain    sciatica; sees orthopedic specialist ([facility])for this and shoulder tendinitis   Obesity, diabetes, and hypertension syndrome (HCC)    on med   Osteoarthritis, knee    Bilateral, s/p 3 total knee replacement surgerues on right, last 2005. Dr. Duwayne   Pellegrini-Steida syndrome    Myositis ossificans   Seasonal allergies    Shingles 07/10/2012   LESIONS CLEARED BUT NOT TOLERATING ANY THING TIGHT AGAINST BODY--SHINGLES WERE AROSS BACK AND ABDOMEN   Past Surgical History:  Procedure Laterality Date   ABDOMINAL HYSTERECTOMY  1984   BLADDER SURGERY  03/2017   bladder tac per pt   COLONOSCOPY  2020   MS-MAC-goly (good)-hems/TAx5)   left shoulder steroid injection     ORIF FEMUR FRACTURE Right 12/06/2023   Procedure: OPEN REDUCTION INTERNAL FIXATION (ORIF) DISTAL FEMUR FRACTURE;  Surgeon: Kendal Franky SQUIBB, MD;  Location: [facility] OR;  Service: Orthopedics;  Laterality: Right;   POLYPECTOMY  2020   TA x 5   TONSILLECTOMY  1977   TOTAL HIP ARTHROPLASTY Right  10/10/2012   Procedure: R TOTAL HIP ARTHROPLASTY ANTERIOR APPROACH;  Surgeon: Lonni CINDERELLA Poli, MD;  Location: [facility];  Service: Orthopedics;  Laterality: Right;  Right Total Hip Arthroplasty, Anterior Approach (C-Arm)   TOTAL KNEE ARTHROPLASTY Right    with multiple revisions, 2003, 2005   TOTAL KNEE ARTHROPLASTY Left 01/09/2013   Procedure: LEFT TOTAL KNEE ARTHROPLASTY;  Surgeon: Lonni CINDERELLA Poli, MD;  Location: [facility];  Service: Orthopedics;  Laterality: Left;   TUBAL LIGATION  1978   WISDOM TOOTH EXTRACTION  2013,2014   Patient Active Problem List   Diagnosis Date Noted   Closed fracture of distal end of right femur, unspecified fracture morphology, initial encounter (HCC) 12/05/2023   Lumbar radiculopathy 09/17/2023   Osteopenia 02/15/2021   Venous stasis of both lower extremities 06/27/2020   Bilateral lower extremity edema 02/05/2020   Recurrent sinusitis 09/03/2018   Vaginal vault prolapse after hysterectomy 02/13/2017   Obesity 01/06/2016   Low back pain 01/06/2016   Normocytic anemia 03/12/2013   Diabetic peripheral neuropathy (HCC) 09/01/2012   Hyperlipemia 09/16/2006   Hypertension associated with diabetes (HCC) 09/16/2006   GERD 09/16/2006   Type II diabetes mellitus with neurological manifestations (HCC) 06/17/2006   Osteoarthritis 06/17/2006   DEPENDENT EDEMA, LEGS, BILATERAL 06/17/2006  PCP: Mercer Clotilda SAUNDERS, MD  REFERRING PROVIDER: Kendal Franky SQUIBB, MD  REFERRING DIAG: ORIF of right distal fracture performed on 12/06/23  THERAPY DIAG:  Pain in right leg  Muscle weakness (generalized)  Pain in right hip  Other abnormalities of gait and mobility  Rationale for Evaluation and Treatment: Rehabilitation  ONSET DATE: 12/06/23  SUBJECTIVE:   SUBJECTIVE STATEMENT: Patient reports increased soreness today from previous session, she states that it feels good though, like she accomplished something. She states she continues to walk in her driveway  and complete her HEP.  From eval: Pt states she has been doing home health PT since her 12/06/23 hip surgery. Was d/c-ed ~1 month ago. Was able to walk 500 feet with PT. Pt reports pain is more in the inside of her medial thigh. No pain where the incision is laterally. Has been using quad cane intermittently in the house. Pt is fearful of another fall. Pt notes she gets fatigued with prolonged standing but not limited by pain.   PERTINENT HISTORY: L3/4 laminectomy and PSIF on 09/17/23; neuropathy in legs, bilat knee replacement, R hip replacement   PAIN:  Are you having pain? Yes: NPRS scale: 0 currently; at worst 10/10 Pain location: R medial thigh Pain description: sometimes sharp or achy Aggravating factors: Rolling in bed, stepping wrong, getting up quickly Relieving factors: hydrocodone , ice/heat  PRECAUTIONS: Fall  RED FLAGS: None   WEIGHT BEARING RESTRICTIONS: WBAT with no ROM restrictions per 12/06/23 surgery note  FALLS:  Has patient fallen in last 6 months? Yes. Number of falls 1  -- 12/05/23 resulting in her femur fracture  LIVING ENVIRONMENT: Lives with: lives with their spouse; children and grandchildren are in and out Lives in: House/apartment Stairs: 1 step up to enter Has following equipment at home: Single point cane, Quad cane small base, Walker - 2 wheeled, Grab bars, and walk in shower  OCCUPATION: Retired - loves to [employer] and entertainment  PLOF: Independent  PATIENT GOALS: Return to cooking and normal activities without having to stop and rest  NEXT MD VISIT: 06/04/24 to see PCP  OBJECTIVE:  Note: Objective measures were completed at Evaluation unless otherwise noted.  DIAGNOSTIC FINDINGS: X-rays of the lumbar spine from 03/09/2024 were independently reviewed and interpreted, showing laminectomy defect at L3/4.  Spondylolisthesis at L3/4 that does not shift between flexion and extension views.  Posterior instrumentation at L3 and L4.  No lucency seen around the  screws and none of them have backed out.  No fracture or dislocation seen.   PATIENT SURVEYS:  Lower Extremity Functional Score: 33 / 80 = 41.3 %  COGNITION: Overall cognitive status: Within functional limits for tasks assessed     SENSATION: Neuropathy in bilat LEs -- knee to ankle  EDEMA:  Will swell if legs are down  MUSCLE LENGTH: Hamstrings: See knee AROM below  POSTURE: flexed trunk , weight shift left, and knees flexed  PALPATION: TTP medial knee/adductors  LOWER EXTREMITY ROM:  Active ROM Right eval Left eval Right 05/01/24 Left 05/01/24  Hip flexion sitting 95 PROM WNL in sup At least 110 deg 95 supine 110 supine  Hip extension      Hip abduction      Hip adduction      Hip internal rotation      Hip external rotation      Knee flexion sitting 80 110 85 110  Knee extension sitting -32 PROM can get -10 in sup -10 -9 -6  Ankle dorsiflexion  Ankle plantarflexion      Ankle inversion      Ankle eversion       (Blank rows = not tested)  LOWER EXTREMITY MMT:   MMT Right eval Left eval  Hip flexion 3- 3+  Hip extension    Hip abduction supine 3+ 3+  Hip adduction    Hip internal rotation    Hip external rotation    Knee flexion 4 4  Knee extension 3- 3+  Ankle dorsiflexion    Ankle plantarflexion    Ankle inversion    Ankle eversion     (Blank rows = not tested)  LOWER EXTREMITY SPECIAL TESTS:  Hip special tests: Belvie (FABER) test: positive  and Thomas test: positive   FUNCTIONAL TESTS:  5 times sit to stand: with UE support 49.86 sec  GAIT: Distance walked: Into clinic Assistive device utilized: Environmental consultant - 2 wheeled Level of assistance: Modified independence Comments: Diminished swing phase on R, bilat steppage gait, diminished heel/toe on R, flexed trunk and knees   [facility] Adult PT Treatment:                                                DATE: 05/08/24 Therapeutic Exercise: Nustep level 5 x 8 mins while gathering subjective and  planning session with patient Therapeutic Activity in // bars: Standing hip abduction 2x10 BIL Standing hip extension 2x10 BIL Sidestepping x2 laps - cues to decrease rotation of hips Hurdle taps LLE 2x10 (unable to flex R hip enough to bring foot to hurdle, also lack of dorsiflexion making it hard) STS x2 - cues for form (heavy use of UE, cues for nose over toes, feet underneath)   [facility] Adult PT Treatment:                                                DATE: 05/06/2024  Neuromuscular re-ed: Standing RLE TKE 2x8, 8s hold, RTB Standing march over cone  2x10 B, walker for UE a. (Only L hand when raising L side) Therapeutic Activity: Standing abduction 2x10 B, hold 2s, RTB, use walker for UE a. Nustep  8' For activity tolerance STS 1x10, cue for RLE foot placement and head hips relationship    [facility] Adult PT Treatment:                                                DATE: 05/01/24 Therapeutic Exercise: NuStep level 5 x 8' while gathering subjective and planning session with patient  Seated LAQ BIL 2x10 Supine DKTC heels on red pball x10 Therapeutic Activity: Supine bent knee fallout 2x15B, hold 2s, RTB Seated adduction w/ball 1x15, hold 3s    PATIENT EDUCATION:  Education details: Exam findings, POC, initial HEP Person educated: Patient Education method: Explanation, Demonstration, and Handouts Education comprehension: verbalized understanding, returned demonstration, and needs further education  HOME EXERCISE PROGRAM: Access Code: [email or web address] URL: [email or web address] Date: 04/23/2024 Prepared by: Gellen April Marie Nonato  Exercises - Seated Heel Slide  - 1 x daily - 7 x weekly - 2 sets - 10 reps - Seated Quad Set  -  1 x daily - 7 x weekly - 2 sets - 10 reps - 3 sec hold - Seated Hip Abduction with Resistance  - 2 x daily - 7 x weekly - 1 sets - 10 reps - 3 sec hold - Seated Hip Flexion Isometric  - 2 x daily - 7 x weekly - 1 sets - 10 reps - 3 sec hold -  Seated Active Hip Flexion  - 2 x daily - 7 x weekly - 1 sets - 10 reps - Beginner Bridge  - 2 x daily - 7 x weekly - 1 sets - 10 reps  ASSESSMENT:  CLINICAL IMPRESSION: Patient presents to PT reporting some increased soreness from previous session. Noted today that she does not have any dorsiflexion on RLE with ambulation and she is unable to dorsiflex from any position. She notes that this was a problem prior to surgery for 10+ years. Session today focused on proximal hip strengthening and improving overall standing activity tolerance. Also focused on STS mechanics, she is only able to standing with heavy use of UE and locking legs in extension before rising. Patient was able to tolerate all prescribed exercises with no adverse effects. Patient continues to benefit from skilled PT services and should be progressed as able to improve functional independence.    From eval: Patient is a 73 y.o. F who was seen today for physical therapy evaluation and treatment for R ORIF on 12/06/23. PMH is significant for bilat knee replacement, prior R THA, bilat LE neuropathy and lumbar surgery in January 2025. Assessment is significant for decreased hip and knee ROM, highly weakened quad and hip musculature and FOF affecting pt's home and community mobility. Pt is considered a high fall risk based on her 5x STS. Pt will benefit from PT to address these issues to maximize her level of function.   OBJECTIVE IMPAIRMENTS: Abnormal gait, decreased activity tolerance, decreased balance, decreased coordination, decreased endurance, decreased mobility, difficulty walking, decreased ROM, decreased strength, hypomobility, increased fascial restrictions, impaired flexibility, improper body mechanics, postural dysfunction, and pain.   ACTIVITY LIMITATIONS: carrying, lifting, bending, sitting, standing, squatting, sleeping, stairs, transfers, bed mobility, dressing, hygiene/grooming, and locomotion level  PARTICIPATION  LIMITATIONS: meal prep, cleaning, driving, shopping, and community activity  PERSONAL FACTORS: Age, Fitness, Past/current experiences, and Time since onset of injury/illness/exacerbation are also affecting patient's functional outcome.   REHAB POTENTIAL: Good  CLINICAL DECISION MAKING: Evolving/moderate complexity  EVALUATION COMPLEXITY: Moderate   GOALS: Goals reviewed with patient? Yes  SHORT TERM GOALS: Target date: 05/13/2024  Pt will be ind with initial HEP Baseline: Goal status: INITIAL  2.  Pt will have improved hip flexion to at least 110 deg AROM Baseline:  Goal status: INITIAL  3.  Pt will report improved medial thigh/knee pain by >/=25% Baseline:  Goal status: INITIAL   LONG TERM GOALS: Target date: 06/10/2024   Pt will be ind with management and progression of HEP Baseline:  Goal status: INITIAL  2.  Pt will be able to amb at least 400' with LRAD for increased home and limited community mobility Baseline:  Goal status: INITIAL  3.  Pt will have improved 5x STS to </=20 sec to reduce her fall risk and increase functional LE strength Baseline:  Goal status: INITIAL  4.  Pt will have improved R knee ext to within at least 10 deg of full extension to demo increased quad strength Baseline: 32 deg away from full ext Goal status: INITIAL  5.  Pt will  have improved LEFS score to >/=42/80 to demo MCID Baseline: 33/80 Goal status: INITIAL   PLAN:  PT FREQUENCY: 2x/week  PT DURATION: 8 weeks  PLANNED INTERVENTIONS: 97164- PT Re-evaluation, 97750- Physical Performance Testing, 97110-Therapeutic exercises, 97530- Therapeutic activity, 97112- Neuromuscular re-education, 97535- Self Care, 97140- Manual therapy, [id number]- Gait training, [phone]- Aquatic Therapy, [phone]- Electrical stimulation (unattended), 97016- Vasopneumatic device, [id number]- Ultrasound, [id number]- Ionotophoresis 4mg /ml Dexamethasone , 79439 (1-2 muscles), 20561 (3+ muscles)- Dry Needling, Patient/Family  education, Balance training, Stair training, Taping, Joint mobilization, Spinal mobilization, Vestibular training, Cryotherapy, and Moist heat  PLAN FOR NEXT SESSION: Continue with therapeutic focus on standing tolerance, RLE strength, quadriceps recruitment, STS transfers and ambulation quality.    Corean Pouch, PTA 05/08/2024, 9:53 AM

## 2024-05-13 ENCOUNTER — Ambulatory Visit

## 2024-05-13 DIAGNOSIS — M25551 Pain in right hip: Secondary | ICD-10-CM

## 2024-05-13 DIAGNOSIS — M79604 Pain in right leg: Secondary | ICD-10-CM

## 2024-05-13 DIAGNOSIS — R2689 Other abnormalities of gait and mobility: Secondary | ICD-10-CM

## 2024-05-13 DIAGNOSIS — M6281 Muscle weakness (generalized): Secondary | ICD-10-CM

## 2024-05-13 NOTE — Therapy (Signed)
 OUTPATIENT PHYSICAL THERAPY TREATMENT   Patient Name: Renee Watts MRN: [id number] DOB:09-Aug-1951, 73 y.o., female Today's Date: 05/13/2024  END OF SESSION:  PT End of Session - 05/13/24 0909     Visit Number 8    Number of Visits 16    Date for PT Re-Evaluation 06/10/24    Authorization Type [employer]    Progress Note Due on Visit 10    PT Start Time 0915    PT Stop Time 0953    PT Time Calculation (min) 38 min    Activity Tolerance Patient tolerated treatment well    Behavior During Therapy [facility] for tasks assessed/performed           Past Medical History:  Diagnosis Date   Allergy    Benign paroxysmal positional vertigo 06/25/2013   dx with prior PCP, s/p labs, mri and vestibular rehab; nasal spray for etd seemed to help   Blood in stool 03/11/2012   Dependent edema    Bilateral   Depression    GERD (gastroesophageal reflux disease)    on meds   Hyperlipidemia    on meds   Low back pain    sciatica; sees orthopedic specialist ([facility])for this and shoulder tendinitis   Obesity, diabetes, and hypertension syndrome (HCC)    on med   Osteoarthritis, knee    Bilateral, s/p 3 total knee replacement surgerues on right, last 2005. Dr. Duwayne   Pellegrini-Steida syndrome    Myositis ossificans   Seasonal allergies    Shingles 07/10/2012   LESIONS CLEARED BUT NOT TOLERATING ANY THING TIGHT AGAINST BODY--SHINGLES WERE AROSS BACK AND ABDOMEN   Past Surgical History:  Procedure Laterality Date   ABDOMINAL HYSTERECTOMY  1984   BLADDER SURGERY  03/2017   bladder tac per pt   COLONOSCOPY  2020   MS-MAC-goly (good)-hems/TAx5)   left shoulder steroid injection     ORIF FEMUR FRACTURE Right 12/06/2023   Procedure: OPEN REDUCTION INTERNAL FIXATION (ORIF) DISTAL FEMUR FRACTURE;  Surgeon: Kendal Franky SQUIBB, MD;  Location: [facility] OR;  Service: Orthopedics;  Laterality: Right;   POLYPECTOMY  2020   TA x 5   TONSILLECTOMY  1977   TOTAL HIP ARTHROPLASTY Right  10/10/2012   Procedure: R TOTAL HIP ARTHROPLASTY ANTERIOR APPROACH;  Surgeon: Lonni CINDERELLA Poli, MD;  Location: [facility] OR[facility];  Service: Orthopedics;  Laterality: Right;  Right Total Hip Arthroplasty, Anterior Approach (C-Arm)   TOTAL KNEE ARTHROPLASTY Right    with multiple revisions, 2003, 2005   TOTAL KNEE ARTHROPLASTY Left 01/09/2013   Procedure: LEFT TOTAL KNEE ARTHROPLASTY;  Surgeon: Lonni CINDERELLA Poli, MD;  Location: [facility];  Service: Orthopedics;  Laterality: Left;   TUBAL LIGATION  1978   WISDOM TOOTH EXTRACTION  2013,2014   Patient Active Problem List   Diagnosis Date Noted   Closed fracture of distal end of right femur, unspecified fracture morphology, initial encounter (HCC) 12/05/2023   Lumbar radiculopathy 09/17/2023   Osteopenia 02/15/2021   Venous stasis of both lower extremities 06/27/2020   Bilateral lower extremity edema 02/05/2020   Recurrent sinusitis 09/03/2018   Vaginal vault prolapse after hysterectomy 02/13/2017   Obesity 01/06/2016   Low back pain 01/06/2016   Normocytic anemia 03/12/2013   Diabetic peripheral neuropathy (HCC) 09/01/2012   Hyperlipemia 09/16/2006   Hypertension associated with diabetes (HCC) 09/16/2006   GERD 09/16/2006   Type II diabetes mellitus with neurological manifestations (HCC) 06/17/2006   Osteoarthritis 06/17/2006   DEPENDENT EDEMA, LEGS, BILATERAL 06/17/2006  PCP: Mercer Clotilda SAUNDERS, MD  REFERRING PROVIDER: Kendal Franky SQUIBB, MD  REFERRING DIAG: ORIF of right distal fracture performed on 12/06/23  THERAPY DIAG:  Pain in right leg  Muscle weakness (generalized)  Pain in right hip  Other abnormalities of gait and mobility  Rationale for Evaluation and Treatment: Rehabilitation  ONSET DATE: 12/06/23  SUBJECTIVE:   SUBJECTIVE STATEMENT: Patient reports that she has been practicing her STS at home as well as ambulating at counter top without AD and she states she has some muscle soreness from this, but not an increase  in pain.  From eval: Pt states she has been doing home health PT since her 12/06/23 hip surgery. Was d/c-ed ~1 month ago. Was able to walk 500 feet with PT. Pt reports pain is more in the inside of her medial thigh. No pain where the incision is laterally. Has been using quad cane intermittently in the house. Pt is fearful of another fall. Pt notes she gets fatigued with prolonged standing but not limited by pain.   PERTINENT HISTORY: L3/4 laminectomy and PSIF on 09/17/23; neuropathy in legs, bilat knee replacement, R hip replacement   PAIN:  Are you having pain? Yes: NPRS scale: 0 currently; at worst 10/10 Pain location: R medial thigh Pain description: sometimes sharp or achy Aggravating factors: Rolling in bed, stepping wrong, getting up quickly Relieving factors: hydrocodone , ice/heat  PRECAUTIONS: Fall  RED FLAGS: None   WEIGHT BEARING RESTRICTIONS: WBAT with no ROM restrictions per 12/06/23 surgery note  FALLS:  Has patient fallen in last 6 months? Yes. Number of falls 1  -- 12/05/23 resulting in her femur fracture  LIVING ENVIRONMENT: Lives with: lives with their spouse; children and grandchildren are in and out Lives in: House/apartment Stairs: 1 step up to enter Has following equipment at home: Single point cane, Quad cane small base, Walker - 2 wheeled, Grab bars, and walk in shower  OCCUPATION: Retired - loves to [employer] and entertainment  PLOF: Independent  PATIENT GOALS: Return to cooking and normal activities without having to stop and rest  NEXT MD VISIT: 06/04/24 to see PCP  OBJECTIVE:  Note: Objective measures were completed at Evaluation unless otherwise noted.  DIAGNOSTIC FINDINGS: X-rays of the lumbar spine from 03/09/2024 were independently reviewed and interpreted, showing laminectomy defect at L3/4.  Spondylolisthesis at L3/4 that does not shift between flexion and extension views.  Posterior instrumentation at L3 and L4.  No lucency seen around the screws and  none of them have backed out.  No fracture or dislocation seen.   PATIENT SURVEYS:  Lower Extremity Functional Score: 33 / 80 = 41.3 %  COGNITION: Overall cognitive status: Within functional limits for tasks assessed     SENSATION: Neuropathy in bilat LEs -- knee to ankle  EDEMA:  Will swell if legs are down  MUSCLE LENGTH: Hamstrings: See knee AROM below  POSTURE: flexed trunk , weight shift left, and knees flexed  PALPATION: TTP medial knee/adductors  LOWER EXTREMITY ROM:  Active ROM Right eval Left eval Right 05/01/24 Left 05/01/24  Hip flexion sitting 95 PROM WNL in sup At least 110 deg 95 supine 110 supine  Hip extension      Hip abduction      Hip adduction      Hip internal rotation      Hip external rotation      Knee flexion sitting 80 110 85 110  Knee extension sitting -32 PROM can get -10 in sup -10 -9 -  6  Ankle dorsiflexion      Ankle plantarflexion      Ankle inversion      Ankle eversion       (Blank rows = not tested)  LOWER EXTREMITY MMT:   MMT Right eval Left eval  Hip flexion 3- 3+  Hip extension    Hip abduction supine 3+ 3+  Hip adduction    Hip internal rotation    Hip external rotation    Knee flexion 4 4  Knee extension 3- 3+  Ankle dorsiflexion    Ankle plantarflexion    Ankle inversion    Ankle eversion     (Blank rows = not tested)  LOWER EXTREMITY SPECIAL TESTS:  Hip special tests: Belvie (FABER) test: positive  and Thomas test: positive   FUNCTIONAL TESTS:  5 times sit to stand: with UE support 49.86 sec  GAIT: Distance walked: Into clinic Assistive device utilized: Environmental consultant - 2 wheeled Level of assistance: Modified independence Comments: Diminished swing phase on R, bilat steppage gait, diminished heel/toe on R, flexed trunk and knees  [facility] Adult PT Treatment:                                                DATE: 05/13/24 Therapeutic Exercise: Nustep level 5 x 8 mins while gathering subjective and planning session  with patient Therapeutic Activity in // bars: Standing hip abduction 2x10 BIL Standing hip extension 2x10 BIL Sidestepping x2 laps - cues to decrease rotation of hips 1/2 foam roll taps BIL 2x5 (IR on RLE to bring foot back down) STS x5 - cues for form (heavy use of UE, cues for nose over toes, feet underneath)  [facility] Adult PT Treatment:                                                DATE: 05/08/24 Therapeutic Exercise: Nustep level 5 x 8 mins while gathering subjective and planning session with patient Therapeutic Activity in // bars: Standing hip abduction 2x10 BIL Standing hip extension 2x10 BIL Sidestepping x2 laps - cues to decrease rotation of hips Hurdle taps LLE 2x10 (unable to flex R hip enough to bring foot to hurdle, also lack of dorsiflexion making it hard) STS x2 - cues for form (heavy use of UE, cues for nose over toes, feet underneath)   [facility] Adult PT Treatment:                                                DATE: 05/06/2024  Neuromuscular re-ed: Standing RLE TKE 2x8, 8s hold, RTB Standing march over cone  2x10 B, walker for UE a. (Only L hand when raising L side) Therapeutic Activity: Standing abduction 2x10 B, hold 2s, RTB, use walker for UE a. Nustep  8' For activity tolerance STS 1x10, cue for RLE foot placement and head hips relationship    PATIENT EDUCATION:  Education details: Exam findings, POC, initial HEP Person educated: Patient Education method: Explanation, Demonstration, and Handouts Education comprehension: verbalized understanding, returned demonstration, and needs further education  HOME EXERCISE PROGRAM: Access Code: [email or web address] URL:  [email or web address] Date: 04/23/2024 Prepared by: Gellen April Marie Nonato  Exercises - Seated Heel Slide  - 1 x daily - 7 x weekly - 2 sets - 10 reps - Seated Quad Set  - 1 x daily - 7 x weekly - 2 sets - 10 reps - 3 sec hold - Seated Hip Abduction with Resistance  - 2 x daily - 7 x weekly -  1 sets - 10 reps - 3 sec hold - Seated Hip Flexion Isometric  - 2 x daily - 7 x weekly - 1 sets - 10 reps - 3 sec hold - Seated Active Hip Flexion  - 2 x daily - 7 x weekly - 1 sets - 10 reps - Beginner Bridge  - 2 x daily - 7 x weekly - 1 sets - 10 reps  ASSESSMENT:  CLINICAL IMPRESSION: Patient presents to PT reporting some soreness from working on her exercises at home, no increase in pain today. Session today continued to focus on improving standing activity tolerance and LE strengthening. She continues to display weakness in BIL LE, R>L, including lack of DF BIL. Patient was able to tolerate all prescribed exercises with no adverse effects. Patient continues to benefit from skilled PT services and should be progressed as able to improve functional independence.   From eval: Patient is a 73 y.o. F who was seen today for physical therapy evaluation and treatment for R ORIF on 12/06/23. PMH is significant for bilat knee replacement, prior R THA, bilat LE neuropathy and lumbar surgery in January 2025. Assessment is significant for decreased hip and knee ROM, highly weakened quad and hip musculature and FOF affecting pt's home and community mobility. Pt is considered a high fall risk based on her 5x STS. Pt will benefit from PT to address these issues to maximize her level of function.   OBJECTIVE IMPAIRMENTS: Abnormal gait, decreased activity tolerance, decreased balance, decreased coordination, decreased endurance, decreased mobility, difficulty walking, decreased ROM, decreased strength, hypomobility, increased fascial restrictions, impaired flexibility, improper body mechanics, postural dysfunction, and pain.   ACTIVITY LIMITATIONS: carrying, lifting, bending, sitting, standing, squatting, sleeping, stairs, transfers, bed mobility, dressing, hygiene/grooming, and locomotion level  PARTICIPATION LIMITATIONS: meal prep, cleaning, driving, shopping, and community activity  PERSONAL FACTORS: Age,  Fitness, Past/current experiences, and Time since onset of injury/illness/exacerbation are also affecting patient's functional outcome.   REHAB POTENTIAL: Good  CLINICAL DECISION MAKING: Evolving/moderate complexity  EVALUATION COMPLEXITY: Moderate   GOALS: Goals reviewed with patient? Yes  SHORT TERM GOALS: Target date: 05/13/2024  Pt will be ind with initial HEP Baseline: Goal status: INITIAL  2.  Pt will have improved hip flexion to at least 110 deg AROM Baseline:  Goal status: INITIAL  3.  Pt will report improved medial thigh/knee pain by >/=25% Baseline:  Goal status: INITIAL   LONG TERM GOALS: Target date: 06/10/2024   Pt will be ind with management and progression of HEP Baseline:  Goal status: INITIAL  2.  Pt will be able to amb at least 400' with LRAD for increased home and limited community mobility Baseline:  Goal status: INITIAL  3.  Pt will have improved 5x STS to </=20 sec to reduce her fall risk and increase functional LE strength Baseline:  Goal status: INITIAL  4.  Pt will have improved R knee ext to within at least 10 deg of full extension to demo increased quad strength Baseline: 32 deg away from full ext Goal status: INITIAL  5.  Pt will have  improved LEFS score to >/=42/80 to demo MCID Baseline: 33/80 Goal status: INITIAL   PLAN:  PT FREQUENCY: 2x/week  PT DURATION: 8 weeks  PLANNED INTERVENTIONS: 97164- PT Re-evaluation, 97750- Physical Performance Testing, 97110-Therapeutic exercises, 97530- Therapeutic activity, 97112- Neuromuscular re-education, 97535- Self Care, [id number]- Manual therapy, [phone]- Gait training, [phone]- Aquatic Therapy, [phone]- Electrical stimulation (unattended), 97016- Vasopneumatic device, [id number]- Ultrasound, [id number]- Ionotophoresis 4mg /ml Dexamethasone , 79439 (1-2 muscles), 20561 (3+ muscles)- Dry Needling, Patient/Family education, Balance training, Stair training, Taping, Joint mobilization, Spinal mobilization, Vestibular  training, Cryotherapy, and Moist heat  PLAN FOR NEXT SESSION: Continue with therapeutic focus on standing tolerance, RLE strength, quadriceps recruitment, STS transfers and ambulation quality.    Corean Pouch, PTA 05/13/2024, 9:54 AM

## 2024-05-15 ENCOUNTER — Ambulatory Visit

## 2024-05-15 DIAGNOSIS — M79604 Pain in right leg: Secondary | ICD-10-CM | POA: Diagnosis not present

## 2024-05-15 DIAGNOSIS — R2689 Other abnormalities of gait and mobility: Secondary | ICD-10-CM

## 2024-05-15 DIAGNOSIS — M6281 Muscle weakness (generalized): Secondary | ICD-10-CM

## 2024-05-15 DIAGNOSIS — M25551 Pain in right hip: Secondary | ICD-10-CM

## 2024-05-15 NOTE — Therapy (Signed)
 OUTPATIENT PHYSICAL THERAPY TREATMENT   Patient Name: Renee Watts MRN: [id number] DOB:1951/01/27, 73 y.o., female Today's Date: 05/15/2024  END OF SESSION:  PT End of Session - 05/15/24 0905     Visit Number 9    Number of Visits 16    Date for Recertification  06/10/24    Authorization Type [employer]    Progress Note Due on Visit 10    PT Start Time 0915    PT Stop Time 0953    PT Time Calculation (min) 38 min    Activity Tolerance Patient tolerated treatment well    Behavior During Therapy [facility] for tasks assessed/performed         Past Medical History:  Diagnosis Date   Allergy    Benign paroxysmal positional vertigo 06/25/2013   dx with prior PCP, s/p labs, mri and vestibular rehab; nasal spray for etd seemed to help   Blood in stool 03/11/2012   Dependent edema    Bilateral   Depression    GERD (gastroesophageal reflux disease)    on meds   Hyperlipidemia    on meds   Low back pain    sciatica; sees orthopedic specialist ([facility])for this and shoulder tendinitis   Obesity, diabetes, and hypertension syndrome (HCC)    on med   Osteoarthritis, knee    Bilateral, s/p 3 total knee replacement surgerues on right, last 2005. Dr. Duwayne   Pellegrini-Steida syndrome    Myositis ossificans   Seasonal allergies    Shingles 07/10/2012   LESIONS CLEARED BUT NOT TOLERATING ANY THING TIGHT AGAINST BODY--SHINGLES WERE AROSS BACK AND ABDOMEN   Past Surgical History:  Procedure Laterality Date   ABDOMINAL HYSTERECTOMY  1984   BLADDER SURGERY  03/2017   bladder tac per pt   COLONOSCOPY  2020   MS-MAC-goly (good)-hems/TAx5)   left shoulder steroid injection     ORIF FEMUR FRACTURE Right 12/06/2023   Procedure: OPEN REDUCTION INTERNAL FIXATION (ORIF) DISTAL FEMUR FRACTURE;  Surgeon: Kendal Franky SQUIBB, MD;  Location: [facility] OR;  Service: Orthopedics;  Laterality: Right;   POLYPECTOMY  2020   TA x 5   TONSILLECTOMY  1977   TOTAL HIP ARTHROPLASTY Right  10/10/2012   Procedure: R TOTAL HIP ARTHROPLASTY ANTERIOR APPROACH;  Surgeon: Lonni CINDERELLA Poli, MD;  Location: [facility];  Service: Orthopedics;  Laterality: Right;  Right Total Hip Arthroplasty, Anterior Approach (C-Arm)   TOTAL KNEE ARTHROPLASTY Right    with multiple revisions, 2003, 2005   TOTAL KNEE ARTHROPLASTY Left 01/09/2013   Procedure: LEFT TOTAL KNEE ARTHROPLASTY;  Surgeon: Lonni CINDERELLA Poli, MD;  Location: [facility];  Service: Orthopedics;  Laterality: Left;   TUBAL LIGATION  1978   WISDOM TOOTH EXTRACTION  2013,2014   Patient Active Problem List   Diagnosis Date Noted   Closed fracture of distal end of right femur, unspecified fracture morphology, initial encounter (HCC) 12/05/2023   Lumbar radiculopathy 09/17/2023   Osteopenia 02/15/2021   Venous stasis of both lower extremities 06/27/2020   Bilateral lower extremity edema 02/05/2020   Recurrent sinusitis 09/03/2018   Vaginal vault prolapse after hysterectomy 02/13/2017   Obesity 01/06/2016   Low back pain 01/06/2016   Normocytic anemia 03/12/2013   Diabetic peripheral neuropathy (HCC) 09/01/2012   Hyperlipemia 09/16/2006   Hypertension associated with diabetes (HCC) 09/16/2006   GERD 09/16/2006   Type II diabetes mellitus with neurological manifestations (HCC) 06/17/2006   Osteoarthritis 06/17/2006   DEPENDENT EDEMA, LEGS, BILATERAL 06/17/2006    PCP:  Mercer Clotilda SAUNDERS, MD  REFERRING PROVIDER: Kendal Franky SQUIBB, MD  REFERRING DIAG: ORIF of right distal fracture performed on 12/06/23  THERAPY DIAG:  Pain in right leg  Muscle weakness (generalized)  Pain in right hip  Other abnormalities of gait and mobility  Rationale for Evaluation and Treatment: Rehabilitation  ONSET DATE: 12/06/23  SUBJECTIVE:   SUBJECTIVE STATEMENT: Patient reports that she is having some muscle soreness due to utilizing her RLE more, completing her HEP, and continuing to walk her driveway.   From eval: Pt states she has been  doing home health PT since her 12/06/23 hip surgery. Was d/c-ed ~1 month ago. Was able to walk 500 feet with PT. Pt reports pain is more in the inside of her medial thigh. No pain where the incision is laterally. Has been using quad cane intermittently in the house. Pt is fearful of another fall. Pt notes she gets fatigued with prolonged standing but not limited by pain.   PERTINENT HISTORY: L3/4 laminectomy and PSIF on 09/17/23; neuropathy in legs, bilat knee replacement, R hip replacement   PAIN:  Are you having pain? Yes: NPRS scale: 0 currently; at worst 10/10 Pain location: R medial thigh Pain description: sometimes sharp or achy Aggravating factors: Rolling in bed, stepping wrong, getting up quickly Relieving factors: hydrocodone , ice/heat  PRECAUTIONS: Fall  RED FLAGS: None   WEIGHT BEARING RESTRICTIONS: WBAT with no ROM restrictions per 12/06/23 surgery note  FALLS:  Has patient fallen in last 6 months? Yes. Number of falls 1  -- 12/05/23 resulting in her femur fracture  LIVING ENVIRONMENT: Lives with: lives with their spouse; children and grandchildren are in and out Lives in: House/apartment Stairs: 1 step up to enter Has following equipment at home: Single point cane, Quad cane small base, Walker - 2 wheeled, Grab bars, and walk in shower  OCCUPATION: Retired - loves to [employer] and entertainment  PLOF: Independent  PATIENT GOALS: Return to cooking and normal activities without having to stop and rest  NEXT MD VISIT: 06/04/24 to see PCP  OBJECTIVE:  Note: Objective measures were completed at Evaluation unless otherwise noted.  DIAGNOSTIC FINDINGS: X-rays of the lumbar spine from 03/09/2024 were independently reviewed and interpreted, showing laminectomy defect at L3/4.  Spondylolisthesis at L3/4 that does not shift between flexion and extension views.  Posterior instrumentation at L3 and L4.  No lucency seen around the screws and none of them have backed out.  No fracture or  dislocation seen.   PATIENT SURVEYS:  Lower Extremity Functional Score: 33 / 80 = 41.3 %  COGNITION: Overall cognitive status: Within functional limits for tasks assessed     SENSATION: Neuropathy in bilat LEs -- knee to ankle  EDEMA:  Will swell if legs are down  MUSCLE LENGTH: Hamstrings: See knee AROM below  POSTURE: flexed trunk , weight shift left, and knees flexed  PALPATION: TTP medial knee/adductors  LOWER EXTREMITY ROM:  Active ROM Right eval Left eval Right 05/01/24 Left 05/01/24  Hip flexion sitting 95 PROM WNL in sup At least 110 deg 95 supine 110 supine  Hip extension      Hip abduction      Hip adduction      Hip internal rotation      Hip external rotation      Knee flexion sitting 80 110 85 110  Knee extension sitting -32 PROM can get -10 in sup -10 -9 -6  Ankle dorsiflexion      Ankle plantarflexion  Ankle inversion      Ankle eversion       (Blank rows = not tested)  LOWER EXTREMITY MMT:   MMT Right eval Left eval  Hip flexion 3- 3+  Hip extension    Hip abduction supine 3+ 3+  Hip adduction    Hip internal rotation    Hip external rotation    Knee flexion 4 4  Knee extension 3- 3+  Ankle dorsiflexion    Ankle plantarflexion    Ankle inversion    Ankle eversion     (Blank rows = not tested)  LOWER EXTREMITY SPECIAL TESTS:  Hip special tests: Belvie (FABER) test: positive  and Thomas test: positive   FUNCTIONAL TESTS:  5 times sit to stand: with UE support 49.86 sec  GAIT: Distance walked: Into clinic Assistive device utilized: Environmental consultant - 2 wheeled Level of assistance: Modified independence Comments: Diminished swing phase on R, bilat steppage gait, diminished heel/toe on R, flexed trunk and knees  [facility] Adult PT Treatment:                                                DATE: 05/15/24 Therapeutic Exercise: Nustep level 5 x 8 mins while gathering subjective and planning session with patient Therapeutic Activity in //  bars: Standing hip abduction 2x10 BIL Standing hip extension 2x10 BIL 1/2 foam roll taps BIL x10 (IR on RLE to bring foot back down) 1/2 foam roll step overs leading with RLE (next session) SPC gait training in // bars - cues for form and sequencing - RUE on bars for extra support   [facility] Adult PT Treatment:                                                DATE: 05/13/24 Therapeutic Exercise: Nustep level 5 x 8 mins while gathering subjective and planning session with patient Therapeutic Activity in // bars: Standing hip abduction 2x10 BIL Standing hip extension 2x10 BIL Sidestepping x2 laps - cues to decrease rotation of hips 1/2 foam roll taps BIL 2x5 (IR on RLE to bring foot back down) STS x5 - cues for form (heavy use of UE, cues for nose over toes, feet underneath)  [facility] Adult PT Treatment:                                                DATE: 05/08/24 Therapeutic Exercise: Nustep level 5 x 8 mins while gathering subjective and planning session with patient Therapeutic Activity in // bars: Standing hip abduction 2x10 BIL Standing hip extension 2x10 BIL Sidestepping x2 laps - cues to decrease rotation of hips Hurdle taps LLE 2x10 (unable to flex R hip enough to bring foot to hurdle, also lack of dorsiflexion making it hard) STS x2 - cues for form (heavy use of UE, cues for nose over toes, feet underneath)    PATIENT EDUCATION:  Education details: Exam findings, POC, initial HEP Person educated: Patient Education method: Explanation, Demonstration, and Handouts Education comprehension: verbalized understanding, returned demonstration, and needs further education  HOME EXERCISE PROGRAM: Access Code: [email or web address] URL: [email or web address]  Date: 04/23/2024 Prepared by: Gellen April Marie Nonato  Exercises - Seated Heel Slide  - 1 x daily - 7 x weekly - 2 sets - 10 reps - Seated Quad Set  - 1 x daily - 7 x weekly - 2 sets - 10 reps - 3 sec hold - Seated Hip Abduction  with Resistance  - 2 x daily - 7 x weekly - 1 sets - 10 reps - 3 sec hold - Seated Hip Flexion Isometric  - 2 x daily - 7 x weekly - 1 sets - 10 reps - 3 sec hold - Seated Active Hip Flexion  - 2 x daily - 7 x weekly - 1 sets - 10 reps - Beginner Bridge  - 2 x daily - 7 x weekly - 1 sets - 10 reps  ASSESSMENT:  CLINICAL IMPRESSION: Patient presents to PT reporting some soreness in her LE from previous session and continuing to work on HEP and walking at home. She is interested in transitioning from [facility] to [facility], introduced gait training with the [facility] today. She was able to sequence while remaining in parallel bars and using RUE on bars to support. Patient was able to tolerate all prescribed exercises with no adverse effects. Patient continues to benefit from skilled PT services and should be progressed as able to improve functional independence.   From eval: Patient is a 73 y.o. F who was seen today for physical therapy evaluation and treatment for R ORIF on 12/06/23. PMH is significant for bilat knee replacement, prior R THA, bilat LE neuropathy and lumbar surgery in January 2025. Assessment is significant for decreased hip and knee ROM, highly weakened quad and hip musculature and FOF affecting pt's home and community mobility. Pt is considered a high fall risk based on her 5x STS. Pt will benefit from PT to address these issues to maximize her level of function.   OBJECTIVE IMPAIRMENTS: Abnormal gait, decreased activity tolerance, decreased balance, decreased coordination, decreased endurance, decreased mobility, difficulty walking, decreased ROM, decreased strength, hypomobility, increased fascial restrictions, impaired flexibility, improper body mechanics, postural dysfunction, and pain.   ACTIVITY LIMITATIONS: carrying, lifting, bending, sitting, standing, squatting, sleeping, stairs, transfers, bed mobility, dressing, hygiene/grooming, and locomotion level  PARTICIPATION LIMITATIONS: meal prep,  cleaning, driving, shopping, and community activity  PERSONAL FACTORS: Age, Fitness, Past/current experiences, and Time since onset of injury/illness/exacerbation are also affecting patient's functional outcome.   REHAB POTENTIAL: Good  CLINICAL DECISION MAKING: Evolving/moderate complexity  EVALUATION COMPLEXITY: Moderate   GOALS: Goals reviewed with patient? Yes  SHORT TERM GOALS: Target date: 05/13/2024  Pt will be ind with initial HEP Baseline: Goal status: MET Pt reports adherence 05/15/24  2.  Pt will have improved hip flexion to at least 110 deg AROM Baseline:  Goal status: Ongoing  3.  Pt will report improved medial thigh/knee pain by >/=25% Baseline:  Goal status: MET 05/15/24: Pt reports max pain at 6/10   LONG TERM GOALS: Target date: 06/10/2024   Pt will be ind with management and progression of HEP Baseline:  Goal status: INITIAL  2.  Pt will be able to amb at least 400' with LRAD for increased home and limited community mobility Baseline:  Goal status: INITIAL  3.  Pt will have improved 5x STS to </=20 sec to reduce her fall risk and increase functional LE strength Baseline:  Goal status: INITIAL  4.  Pt will have improved R knee ext to within at least 10 deg of full extension to  demo increased quad strength Baseline: 32 deg away from full ext Goal status: INITIAL  5.  Pt will have improved LEFS score to >/=42/80 to demo MCID Baseline: 33/80 Goal status: INITIAL   PLAN:  PT FREQUENCY: 2x/week  PT DURATION: 8 weeks  PLANNED INTERVENTIONS: 97164- PT Re-evaluation, 97750- Physical Performance Testing, 97110-Therapeutic exercises, 97530- Therapeutic activity, 97112- Neuromuscular re-education, 97535- Self Care, [id number]- Manual therapy, [id number]- Gait training, [phone]- Aquatic Therapy, [phone]- Electrical stimulation (unattended), 97016- Vasopneumatic device, [id number]- Ultrasound, [id number]- Ionotophoresis 4mg /ml Dexamethasone , 79439 (1-2 muscles), [id number] (3+  muscles)- Dry Needling, Patient/Family education, Balance training, Stair training, Taping, Joint mobilization, Spinal mobilization, Vestibular training, Cryotherapy, and Moist heat  PLAN FOR NEXT SESSION: Continue with therapeutic focus on standing tolerance, RLE strength, quadriceps recruitment, STS transfers and ambulation quality.    Corean Pouch, PTA 05/15/2024, 9:52 AM

## 2024-05-19 ENCOUNTER — Ambulatory Visit

## 2024-05-20 ENCOUNTER — Encounter: Payer: Self-pay | Admitting: Physical Therapy

## 2024-05-20 ENCOUNTER — Ambulatory Visit: Admitting: Physical Therapy

## 2024-05-20 DIAGNOSIS — M79604 Pain in right leg: Secondary | ICD-10-CM

## 2024-05-20 DIAGNOSIS — M25551 Pain in right hip: Secondary | ICD-10-CM

## 2024-05-20 DIAGNOSIS — M6281 Muscle weakness (generalized): Secondary | ICD-10-CM

## 2024-05-20 DIAGNOSIS — R2689 Other abnormalities of gait and mobility: Secondary | ICD-10-CM

## 2024-05-20 NOTE — Therapy (Signed)
 OUTPATIENT PHYSICAL THERAPY RE-EVAL   Patient Name: Renee Watts MRN: [id number] DOB:May 02, 1951, 73 y.o., female Today's Date: 05/20/2024  END OF SESSION:  PT End of Session - 05/20/24 1653     Visit Number 10    Number of Visits 20    Date for Recertification  06/10/24    Authorization Type [employer]    Progress Note Due on Visit 10    PT Start Time 1615    PT Stop Time 1653    PT Time Calculation (min) 38 min          Past Medical History:  Diagnosis Date   Allergy    Benign paroxysmal positional vertigo 06/25/2013   dx with prior PCP, s/p labs, mri and vestibular rehab; nasal spray for etd seemed to help   Blood in stool 03/11/2012   Dependent edema    Bilateral   Depression    GERD (gastroesophageal reflux disease)    on meds   Hyperlipidemia    on meds   Low back pain    sciatica; sees orthopedic specialist ([facility])for this and shoulder tendinitis   Obesity, diabetes, and hypertension syndrome (HCC)    on med   Osteoarthritis, knee    Bilateral, s/p 3 total knee replacement surgerues on right, last 2005. Dr. Duwayne   Pellegrini-Steida syndrome    Myositis ossificans   Seasonal allergies    Shingles 07/10/2012   LESIONS CLEARED BUT NOT TOLERATING ANY THING TIGHT AGAINST BODY--SHINGLES WERE AROSS BACK AND ABDOMEN   Past Surgical History:  Procedure Laterality Date   ABDOMINAL HYSTERECTOMY  1984   BLADDER SURGERY  03/2017   bladder tac per pt   COLONOSCOPY  2020   MS-MAC-goly (good)-hems/TAx5)   left shoulder steroid injection     ORIF FEMUR FRACTURE Right 12/06/2023   Procedure: OPEN REDUCTION INTERNAL FIXATION (ORIF) DISTAL FEMUR FRACTURE;  Surgeon: Kendal Franky SQUIBB, MD;  Location: [facility] OR;  Service: Orthopedics;  Laterality: Right;   POLYPECTOMY  2020   TA x 5   TONSILLECTOMY  1977   TOTAL HIP ARTHROPLASTY Right 10/10/2012   Procedure: R TOTAL HIP ARTHROPLASTY ANTERIOR APPROACH;  Surgeon: Lonni CINDERELLA Poli, MD;  Location: [facility]  ORS;  Service: Orthopedics;  Laterality: Right;  Right Total Hip Arthroplasty, Anterior Approach (C-Arm)   TOTAL KNEE ARTHROPLASTY Right    with multiple revisions, 2003, 2005   TOTAL KNEE ARTHROPLASTY Left 01/09/2013   Procedure: LEFT TOTAL KNEE ARTHROPLASTY;  Surgeon: Lonni CINDERELLA Poli, MD;  Location: [facility] ORS;  Service: Orthopedics;  Laterality: Left;   TUBAL LIGATION  1978   WISDOM TOOTH EXTRACTION  2013,2014   Patient Active Problem List   Diagnosis Date Noted   Closed fracture of distal end of right femur, unspecified fracture morphology, initial encounter (HCC) 12/05/2023   Lumbar radiculopathy 09/17/2023   Osteopenia 02/15/2021   Venous stasis of both lower extremities 06/27/2020   Bilateral lower extremity edema 02/05/2020   Recurrent sinusitis 09/03/2018   Vaginal vault prolapse after hysterectomy 02/13/2017   Obesity 01/06/2016   Low back pain 01/06/2016   Normocytic anemia 03/12/2013   Diabetic peripheral neuropathy (HCC) 09/01/2012   Hyperlipemia 09/16/2006   Hypertension associated with diabetes (HCC) 09/16/2006   GERD 09/16/2006   Type II diabetes mellitus with neurological manifestations (HCC) 06/17/2006   Osteoarthritis 06/17/2006   DEPENDENT EDEMA, LEGS, BILATERAL 06/17/2006    PCP: Mercer Clotilda SAUNDERS, MD  REFERRING PROVIDER: Kendal Franky SQUIBB, MD  REFERRING DIAG: ORIF of right distal  fracture performed on 12/06/23  THERAPY DIAG:  Pain in right leg  Pain in right hip  Muscle weakness (generalized)  Other abnormalities of gait and mobility  Rationale for Evaluation and Treatment: Rehabilitation  ONSET DATE: 12/06/23  SUBJECTIVE:   SUBJECTIVE STATEMENT: Pt attended today's session with reports of 0/10 pain. Pt stated that they have maintained good compliance with current HEP.  No real pain, just some stiffness, has been good with testing WB tolerance in the home throughout the day.   From eval: Pt states she has been doing home health PT since her  12/06/23 hip surgery. Was d/c-ed ~1 month ago. Was able to walk 500 feet with PT. Pt reports pain is more in the inside of her medial thigh. No pain where the incision is laterally. Has been using quad cane intermittently in the house. Pt is fearful of another fall. Pt notes she gets fatigued with prolonged standing but not limited by pain.   PERTINENT HISTORY: L3/4 laminectomy and PSIF on 09/17/23; neuropathy in legs, bilat knee replacement, R hip replacement   PAIN:  Are you having pain? Yes: NPRS scale: 0 currently; at worst 10/10 Pain location: R medial thigh Pain description: sometimes sharp or achy Aggravating factors: Rolling in bed, stepping wrong, getting up quickly Relieving factors: hydrocodone , ice/heat  PRECAUTIONS: Fall  RED FLAGS: None   WEIGHT BEARING RESTRICTIONS: WBAT with no ROM restrictions per 12/06/23 surgery note  FALLS:  Has patient fallen in last 6 months? Yes. Number of falls 1  -- 12/05/23 resulting in her femur fracture  LIVING ENVIRONMENT: Lives with: lives with their spouse; children and grandchildren are in and out Lives in: House/apartment Stairs: 1 step up to enter Has following equipment at home: Single point cane, Quad cane small base, Walker - 2 wheeled, Grab bars, and walk in shower  OCCUPATION: Retired - loves to [employer] and entertainment  PLOF: Independent  PATIENT GOALS: Return to cooking and normal activities without having to stop and rest  NEXT MD VISIT: 06/04/24 to see PCP  OBJECTIVE:  Note: Objective measures were completed at Evaluation unless otherwise noted.  DIAGNOSTIC FINDINGS: X-rays of the lumbar spine from 03/09/2024 were independently reviewed and interpreted, showing laminectomy defect at L3/4.  Spondylolisthesis at L3/4 that does not shift between flexion and extension views.  Posterior instrumentation at L3 and L4.  No lucency seen around the screws and none of them have backed out.  No fracture or dislocation seen.   PATIENT  SURVEYS:  Lower Extremity Functional Score: 33 / 80 = 41.3 %  05/20/2024: 53/80  COGNITION: Overall cognitive status: Within functional limits for tasks assessed     SENSATION: Neuropathy in bilat LEs -- knee to ankle  EDEMA:  Will swell if legs are down  MUSCLE LENGTH: Hamstrings: See knee AROM below  POSTURE: flexed trunk , weight shift left, and knees flexed  PALPATION: TTP medial knee/adductors  LOWER EXTREMITY ROM:  Active ROM Right eval Left eval Right 05/01/24 Left 05/01/24  Hip flexion sitting 95 PROM WNL in sup At least 110 deg 95 supine 110 supine  Hip extension      Hip abduction      Hip adduction      Hip internal rotation      Hip external rotation      Knee flexion sitting 80 110 85 110  Knee extension sitting -32 PROM can get -10 in sup -10 -9 -6  Ankle dorsiflexion      Ankle plantarflexion  Ankle inversion      Ankle eversion       (Blank rows = not tested)  LOWER EXTREMITY MMT:   MMT Right eval Left eval  Hip flexion 3- 3+  Hip extension    Hip abduction supine 3+ 3+  Hip adduction    Hip internal rotation    Hip external rotation    Knee flexion 4 4  Knee extension 3- 3+  Ankle dorsiflexion    Ankle plantarflexion    Ankle inversion    Ankle eversion     (Blank rows = not tested)  LOWER EXTREMITY SPECIAL TESTS:  Hip special tests: Belvie (FABER) test: positive  and Thomas test: positive   FUNCTIONAL TESTS:  5 times sit to stand: with UE support 49.86 sec 05/20/2024: 72m, UE support from mat, height parallel with arm chair   : 300' w/ FWW  GAIT: Distance walked: Into clinic Assistive device utilized: Environmental consultant - 2 wheeled Level of assistance: Modified independence Comments: Diminished swing phase on R, bilat steppage gait, diminished heel/toe on R, flexed trunk and knees    [facility] Adult PT Treatment:                                                DATE: 05/20/2024 Therapeutic Activity: Objective measures Goal  assessment, functional testing Self Care: Pt education POC discussion   [facility] Adult PT Treatment:                                                DATE: 05/15/24 Therapeutic Exercise: Nustep level 5 x 8 mins while gathering subjective and planning session with patient Therapeutic Activity in // bars: Standing hip abduction 2x10 BIL Standing hip extension 2x10 BIL 1/2 foam roll taps BIL x10 (IR on RLE to bring foot back down) 1/2 foam roll step overs leading with RLE (next session) SPC gait training in // bars - cues for form and sequencing - RUE on bars for extra support   [facility] Adult PT Treatment:                                                DATE: 05/13/24 Therapeutic Exercise: Nustep level 5 x 8 mins while gathering subjective and planning session with patient Therapeutic Activity in // bars: Standing hip abduction 2x10 BIL Standing hip extension 2x10 BIL Sidestepping x2 laps - cues to decrease rotation of hips 1/2 foam roll taps BIL 2x5 (IR on RLE to bring foot back down) STS x5 - cues for form (heavy use of UE, cues for nose over toes, feet underneath)   PATIENT EDUCATION:  Education details: Exam findings, POC, initial HEP Person educated: Patient Education method: Explanation, Demonstration, and Handouts Education comprehension: verbalized understanding, returned demonstration, and needs further education  HOME EXERCISE PROGRAM: Access Code: [email or web address] URL: [email or web address] Date: 04/23/2024 Prepared by: Gellen April Marie Nonato  Exercises - Seated Heel Slide  - 1 x daily - 7 x weekly - 2 sets - 10 reps - Seated Quad Set  - 1 x daily - 7 x weekly - 2 sets - 10  reps - 3 sec hold - Seated Hip Abduction with Resistance  - 2 x daily - 7 x weekly - 1 sets - 10 reps - 3 sec hold - Seated Hip Flexion Isometric  - 2 x daily - 7 x weekly - 1 sets - 10 reps - 3 sec hold - Seated Active Hip Flexion  - 2 x daily - 7 x weekly - 1 sets - 10 reps - Beginner Bridge  -  2 x daily - 7 x weekly - 1 sets - 10 reps  ASSESSMENT:  CLINICAL IMPRESSION: Progress note 05/20/2024: Pt attended physical therapy session for re-evaluation of R hip ORIF. Pt is progressing well and has met 6 goals, pt continues to work towards 2 other initial goals. Difficulties continue with RLE strength, and transfer quality necessary for a safe return to independent lifestyle inline with PLOF. 2 additional goals were added to further progress quality of AROM and ambulation quality necessary for return to higher level community participation. Pt required minimal cuing as well as no assistance for safe and appropriate performance of today's activities. Continue with therapeutic focus on RLE strengthening, quadriceps recruitment,ambulation quality, and transfer quality. Education was given to continue applying ADL education from previous sessions as well as performing HEP as prescribed with freedom to progress as tolerated using previous education on modification and exercise dosage. Pt has displayed and verbalized competence regarding this education.   Pt continues to require the intervention of skilled outpatient physical therapy to address the aforementioned deficits and progress towards a functional level in line with therapeutic goals.  OBJECTIVE IMPAIRMENTS: Abnormal gait, decreased activity tolerance, decreased balance, decreased coordination, decreased endurance, decreased mobility, difficulty walking, decreased ROM, decreased strength, hypomobility, increased fascial restrictions, impaired flexibility, improper body mechanics, postural dysfunction, and pain.   ACTIVITY LIMITATIONS: carrying, lifting, bending, sitting, standing, squatting, sleeping, stairs, transfers, bed mobility, dressing, hygiene/grooming, and locomotion level  PARTICIPATION LIMITATIONS: meal prep, cleaning, driving, shopping, and community activity  PERSONAL FACTORS: Age, Fitness, Past/current experiences, and Time since  onset of injury/illness/exacerbation are also affecting patient's functional outcome.   REHAB POTENTIAL: Good  CLINICAL DECISION MAKING: Evolving/moderate complexity  EVALUATION COMPLEXITY: Moderate   GOALS: Goals reviewed with patient? Yes  SHORT TERM GOALS: Target date: 05/13/2024  Pt will be ind with initial HEP Baseline: Goal status: MET Pt reports adherence 05/15/24  2.  Pt will have improved hip flexion to at least 110 deg AROM Baseline:  Goal status: MET (in supine, and seated AAROM)  3.  Pt will report improved medial thigh/knee pain by >/=25% Baseline:  Goal status: MET 05/15/24: Pt reports max pain at 6/10   LONG TERM GOALS: Target date: 06/10/2024   Pt will be ind with management and progression of HEP Baseline:  Goal status: MET 05/20/2024  2.  Pt will be able to amb at least 400' with LRAD for increased home and limited community mobility Baseline:  Goal status: MET 05/20/2024 (walked around 500')  3.  Pt will have improved 5x STS to </=20 sec to reduce her fall risk and increase functional LE strength Baseline:  Goal status: ONGOING 05/20/2024  4.  Pt will have improved R knee ext to within at least 10 deg of full extension to demo increased quad strength Baseline: 32 deg away from full ext Goal status: ONGOING (10d AROM, in sitting, 0 AAROM)  5.  Pt will have improved LEFS score to >/=42/80 to demo MCID Baseline: 33/80 05/20/2024: 53/80 Goal status: MET 05/20/2024  6.  Pt will improve hip flexion to 110d AROM in seated   Baseline: 110d AAROM  Goal status INITIAL 05/20/2024  7.  Pt will independently ambulate 500' with LRAD within 6 minutes to demonstrate improved weightbearing tolerance, BLE strength, and functional capacity for community ambulation.  Baseline: - 300' FWW  Goal status INITIAL 05/20/2024   PLAN:  PT FREQUENCY: 2x/week  PT DURATION: 8 weeks  PLANNED INTERVENTIONS: 97164- PT Re-evaluation, 97750- Physical Performance Testing,  97110-Therapeutic exercises, 97530- Therapeutic activity, 97112- Neuromuscular re-education, 97535- Self Care, 02859- Manual therapy, [phone]- Gait training, [phone]- Aquatic Therapy, [phone]- Electrical stimulation (unattended), 97016- Vasopneumatic device, [id number]- Ultrasound, [id number]- Ionotophoresis 4mg /ml Dexamethasone , 79439 (1-2 muscles), 20561 (3+ muscles)- Dry Needling, Patient/Family education, Balance training, Stair training, Taping, Joint mobilization, Spinal mobilization, Vestibular training, Cryotherapy, and Moist heat  PLAN FOR NEXT SESSION: Continue with therapeutic focus on RLE strengthening, quadriceps recruitment,ambulation quality, and transfer quality.    Mabel Kiang, PT, DPT 05/20/2024, 4:55 PM

## 2024-05-26 ENCOUNTER — Encounter: Payer: Self-pay | Admitting: Physical Therapy

## 2024-05-26 ENCOUNTER — Ambulatory Visit: Admitting: Physical Therapy

## 2024-05-26 DIAGNOSIS — M79604 Pain in right leg: Secondary | ICD-10-CM | POA: Diagnosis not present

## 2024-05-26 DIAGNOSIS — R2689 Other abnormalities of gait and mobility: Secondary | ICD-10-CM

## 2024-05-26 DIAGNOSIS — M6281 Muscle weakness (generalized): Secondary | ICD-10-CM

## 2024-05-26 DIAGNOSIS — M25551 Pain in right hip: Secondary | ICD-10-CM

## 2024-05-26 NOTE — Therapy (Signed)
 OUTPATIENT PHYSICAL THERAPY TREATMENT   Patient Name: Renee Watts MRN: [id number] DOB:[birth year], 73 y.o., female Today's Date: 05/26/2024  END OF SESSION:  PT End of Session - 05/26/24 0934     Visit Number 11    Number of Visits 20    Date for Recertification  06/10/24    Authorization Type [employer]    Progress Note Due on Visit 10    PT Start Time 0934    PT Stop Time 1012    PT Time Calculation (min) 38 min           Past Medical History:  Diagnosis Date   Allergy    Benign paroxysmal positional vertigo 06/25/2013   dx with prior PCP, s/p labs, mri and vestibular rehab; nasal spray for etd seemed to help   Blood in stool 03/11/2012   Dependent edema    Bilateral   Depression    GERD (gastroesophageal reflux disease)    on meds   Hyperlipidemia    on meds   Low back pain    sciatica; sees orthopedic specialist ([facility])for this and shoulder tendinitis   Obesity, diabetes, and hypertension syndrome (HCC)    on med   Osteoarthritis, knee    Bilateral, s/p 3 total knee replacement surgerues on right, last 2005. Dr. Duwayne   Pellegrini-Steida syndrome    Myositis ossificans   Seasonal allergies    Shingles 07/10/2012   LESIONS CLEARED BUT NOT TOLERATING ANY THING TIGHT AGAINST BODY--SHINGLES WERE AROSS BACK AND ABDOMEN   Past Surgical History:  Procedure Laterality Date   ABDOMINAL HYSTERECTOMY  1984   BLADDER SURGERY  03/2017   bladder tac per pt   COLONOSCOPY  2020   MS-MAC-goly (good)-hems/TAx5)   left shoulder steroid injection     ORIF FEMUR FRACTURE Right 12/06/2023   Procedure: OPEN REDUCTION INTERNAL FIXATION (ORIF) DISTAL FEMUR FRACTURE;  Surgeon: Kendal Franky SQUIBB, MD;  Location: [facility];  Service: Orthopedics;  Laterality: Right;   POLYPECTOMY  2020   TA x 5   TONSILLECTOMY  1977   TOTAL HIP ARTHROPLASTY Right 10/10/2012   Procedure: R TOTAL HIP ARTHROPLASTY ANTERIOR APPROACH;  Surgeon: Lonni CINDERELLA Poli, MD;  Location:  [facility];  Service: Orthopedics;  Laterality: Right;  Right Total Hip Arthroplasty, Anterior Approach (C-Arm)   TOTAL KNEE ARTHROPLASTY Right    with multiple revisions, 2003, 2005   TOTAL KNEE ARTHROPLASTY Left 01/09/2013   Procedure: LEFT TOTAL KNEE ARTHROPLASTY;  Surgeon: Lonni CINDERELLA Poli, MD;  Location: [facility];  Service: Orthopedics;  Laterality: Left;   TUBAL LIGATION  1978   WISDOM TOOTH EXTRACTION  2013,2014   Patient Active Problem List   Diagnosis Date Noted   Closed fracture of distal end of right femur, unspecified fracture morphology, initial encounter (HCC) 12/05/2023   Lumbar radiculopathy 09/17/2023   Osteopenia 02/15/2021   Venous stasis of both lower extremities 06/27/2020   Bilateral lower extremity edema 02/05/2020   Recurrent sinusitis 09/03/2018   Vaginal vault prolapse after hysterectomy 02/13/2017   Obesity 01/06/2016   Low back pain 01/06/2016   Normocytic anemia 03/12/2013   Diabetic peripheral neuropathy (HCC) 09/01/2012   Hyperlipemia 09/16/2006   Hypertension associated with diabetes (HCC) 09/16/2006   GERD 09/16/2006   Type II diabetes mellitus with neurological manifestations (HCC) 06/17/2006   Osteoarthritis 06/17/2006   DEPENDENT EDEMA, LEGS, BILATERAL 06/17/2006    PCP: Mercer Clotilda SAUNDERS, MD  REFERRING PROVIDER: Kendal Franky SQUIBB, MD  REFERRING DIAG: ORIF of right  distal fracture performed on 12/06/23  THERAPY DIAG:  Pain in right leg  Pain in right hip  Muscle weakness (generalized)  Other abnormalities of gait and mobility  Rationale for Evaluation and Treatment: Rehabilitation  ONSET DATE: 12/06/23  SUBJECTIVE:   SUBJECTIVE STATEMENT: Continues to report no pain just stiffness. States she wasn't feeling so good this weekend so wasn't able to do as much.    From eval: Pt states she has been doing home health PT since her 12/06/23 hip surgery. Was d/c-ed ~1 month ago. Was able to walk 500 feet with PT. Pt reports pain is more in  the inside of her medial thigh. No pain where the incision is laterally. Has been using quad cane intermittently in the house. Pt is fearful of another fall. Pt notes she gets fatigued with prolonged standing but not limited by pain.   PERTINENT HISTORY: L3/4 laminectomy and PSIF on 09/17/23; neuropathy in legs, bilat knee replacement, R hip replacement   PAIN:  Are you having pain? Yes: NPRS scale: 0 currently; at worst 10/10 Pain location: R medial thigh Pain description: sometimes sharp or achy Aggravating factors: Rolling in bed, stepping wrong, getting up quickly Relieving factors: hydrocodone , ice/heat  PRECAUTIONS: Fall  RED FLAGS: None   WEIGHT BEARING RESTRICTIONS: WBAT with no ROM restrictions per 12/06/23 surgery note  FALLS:  Has patient fallen in last 6 months? Yes. Number of falls 1  -- 12/05/23 resulting in her femur fracture  LIVING ENVIRONMENT: Lives with: lives with their spouse; children and grandchildren are in and out Lives in: House/apartment Stairs: 1 step up to enter Has following equipment at home: Single point cane, Quad cane small base, Walker - 2 wheeled, Grab bars, and walk in shower  OCCUPATION: Retired - loves to [employer] and entertainment  PLOF: Independent  PATIENT GOALS: Return to cooking and normal activities without having to stop and rest  NEXT MD VISIT: 06/04/24 to see PCP  OBJECTIVE:  Note: Objective measures were completed at Evaluation unless otherwise noted.  DIAGNOSTIC FINDINGS: X-rays of the lumbar spine from 03/09/2024 were independently reviewed and interpreted, showing laminectomy defect at L3/4.  Spondylolisthesis at L3/4 that does not shift between flexion and extension views.  Posterior instrumentation at L3 and L4.  No lucency seen around the screws and none of them have backed out.  No fracture or dislocation seen.   PATIENT SURVEYS:  Lower Extremity Functional Score: 33 / 80 = 41.3 %  05/20/2024: 53/80    SENSATION: Neuropathy  in bilat LEs -- knee to ankle  EDEMA:  Will swell if legs are down  MUSCLE LENGTH: Hamstrings: See knee AROM below  POSTURE: flexed trunk , weight shift left, and knees flexed  PALPATION: TTP medial knee/adductors  LOWER EXTREMITY ROM:  Active ROM Right eval Left eval Right 05/01/24 Left 05/01/24  Hip flexion sitting 95 PROM WNL in sup At least 110 deg 95 supine 110 supine  Hip extension      Hip abduction      Hip adduction      Hip internal rotation      Hip external rotation      Knee flexion sitting 80 110 85 110  Knee extension sitting -32 PROM can get -10 in sup -10 -9 -6  Ankle dorsiflexion      Ankle plantarflexion      Ankle inversion      Ankle eversion       (Blank rows = not tested)  LOWER EXTREMITY  MMT:   MMT Right eval Left eval  Hip flexion 3- 3+  Hip extension    Hip abduction supine 3+ 3+  Hip adduction    Hip internal rotation    Hip external rotation    Knee flexion 4 4  Knee extension 3- 3+  Ankle dorsiflexion    Ankle plantarflexion    Ankle inversion    Ankle eversion     (Blank rows = not tested)  LOWER EXTREMITY SPECIAL TESTS:  Hip special tests: Belvie (FABER) test: positive  and Thomas test: positive   FUNCTIONAL TESTS:  5 times sit to stand: with UE support 49.86 sec 05/20/2024: 110m, UE support from mat, height parallel with arm chair   : 300' w/ FWW  GAIT: Distance walked: Into clinic Assistive device utilized: Environmental consultant - 2 wheeled Level of assistance: Modified independence Comments: Diminished swing phase on R, bilat steppage gait, diminished heel/toe on R, flexed trunk and knees   [facility] Adult PT Treatment:                                                DATE: 05/26/2024 Therapeutic Exercise: Nustep L4-L6 x 8 min, increased resistance every 2 min intermittently with and without UEs Therapeutic Activity: Standing hip abduction 1# 2x10 bilat Standing hip ext 1# 2x10 bilat Standing marching 1# x5 bilat, eccentrics x  5 Standing heel raise x20 Standing counter squat to chair 2x5 Sit<>stand x5 Gait Training: Amb with RW intermittent fast/slow   [facility] Adult PT Treatment:                                                DATE: 05/20/2024 Therapeutic Activity: Objective measures Goal assessment, functional testing Self Care: Pt education POC discussion   [facility] Adult PT Treatment:                                                DATE: 05/15/24 Therapeutic Exercise: Nustep level 5 x 8 mins while gathering subjective and planning session with patient Therapeutic Activity in // bars: Standing hip abduction 2x10 BIL Standing hip extension 2x10 BIL 1/2 foam roll taps BIL x10 (IR on RLE to bring foot back down) 1/2 foam roll step overs leading with RLE (next session) SPC gait training in // bars - cues for form and sequencing - RUE on bars for extra support   [facility] Adult PT Treatment:                                                DATE: 05/13/24 Therapeutic Exercise: Nustep level 5 x 8 mins while gathering subjective and planning session with patient Therapeutic Activity in // bars: Standing hip abduction 2x10 BIL Standing hip extension 2x10 BIL Sidestepping x2 laps - cues to decrease rotation of hips 1/2 foam roll taps BIL 2x5 (IR on RLE to bring foot back down) STS x5 - cues for form (heavy use of UE, cues for nose over toes,  feet underneath)   PATIENT EDUCATION:  Education details: Exam findings, POC, initial HEP Person educated: Patient Education method: Explanation, Demonstration, and Handouts Education comprehension: verbalized understanding, returned demonstration, and needs further education  HOME EXERCISE PROGRAM: Access Code: [email or web address] URL: [email or web address] Date: 04/23/2024 Prepared by: Ticara Waner April Marie Eward Rutigliano  Exercises - Seated Heel Slide  - 1 x daily - 7 x weekly - 2 sets - 10 reps - Seated Quad Set  - 1 x daily - 7 x weekly - 2 sets - 10 reps - 3 sec hold - Seated  Hip Abduction with Resistance  - 2 x daily - 7 x weekly - 1 sets - 10 reps - 3 sec hold - Seated Hip Flexion Isometric  - 2 x daily - 7 x weekly - 1 sets - 10 reps - 3 sec hold - Seated Active Hip Flexion  - 2 x daily - 7 x weekly - 1 sets - 10 reps - Beginner Bridge  - 2 x daily - 7 x weekly - 1 sets - 10 reps  ASSESSMENT:  CLINICAL IMPRESSION: Treatment focused on progressive strengthening for pt's hips. Pt continues to utilize heavy reliance on UEs for transfers. Working on increasing R LE weight shift with sit to stand and obtaining enough forward flexion. Improving endurance and change of gait speed with cueing.   Progress note 05/20/2024: Pt attended physical therapy session for re-evaluation of R hip ORIF. Pt is progressing well and has met 6 goals, pt continues to work towards 2 other initial goals. Difficulties continue with RLE strength, and transfer quality necessary for a safe return to independent lifestyle inline with PLOF. 2 additional goals were added to further progress quality of AROM and ambulation quality necessary for return to higher level community participation. Pt required minimal cuing as well as no assistance for safe and appropriate performance of today's activities. Continue with therapeutic focus on RLE strengthening, quadriceps recruitment,ambulation quality, and transfer quality. Education was given to continue applying ADL education from previous sessions as well as performing HEP as prescribed with freedom to progress as tolerated using previous education on modification and exercise dosage. Pt has displayed and verbalized competence regarding this education.   Pt continues to require the intervention of skilled outpatient physical therapy to address the aforementioned deficits and progress towards a functional level in line with therapeutic goals.  OBJECTIVE IMPAIRMENTS: Abnormal gait, decreased activity tolerance, decreased balance, decreased coordination, decreased  endurance, decreased mobility, difficulty walking, decreased ROM, decreased strength, hypomobility, increased fascial restrictions, impaired flexibility, improper body mechanics, postural dysfunction, and pain.   ACTIVITY LIMITATIONS: carrying, lifting, bending, sitting, standing, squatting, sleeping, stairs, transfers, bed mobility, dressing, hygiene/grooming, and locomotion level  PARTICIPATION LIMITATIONS: meal prep, cleaning, driving, shopping, and community activity  PERSONAL FACTORS: Age, Fitness, Past/current experiences, and Time since onset of injury/illness/exacerbation are also affecting patient's functional outcome.   REHAB POTENTIAL: Good  CLINICAL DECISION MAKING: Evolving/moderate complexity  EVALUATION COMPLEXITY: Moderate   GOALS: Goals reviewed with patient? Yes  SHORT TERM GOALS: Target date: 05/13/2024  Pt will be ind with initial HEP Baseline: Goal status: MET Pt reports adherence 05/15/24  2.  Pt will have improved hip flexion to at least 110 deg AROM Baseline:  Goal status: MET (in supine, and seated AAROM)  3.  Pt will report improved medial thigh/knee pain by >/=25% Baseline:  Goal status: MET 05/15/24: Pt reports max pain at 6/10   LONG TERM GOALS: Target date: 06/10/2024   Pt will be ind  with management and progression of HEP Baseline:  Goal status: MET 05/20/2024  2.  Pt will be able to amb at least 400' with LRAD for increased home and limited community mobility Baseline:  Goal status: MET 05/20/2024 (walked around 500')  3.  Pt will have improved 5x STS to </=20 sec to reduce her fall risk and increase functional LE strength Baseline:  Goal status: ONGOING 05/20/2024  4.  Pt will have improved R knee ext to within at least 10 deg of full extension to demo increased quad strength Baseline: 32 deg away from full ext Goal status: ONGOING (10d AROM, in sitting, 0 AAROM)  5.  Pt will have improved LEFS score to >/=42/80 to demo MCID Baseline:  33/80 05/20/2024: 53/80 Goal status: MET 05/20/2024  6. Pt will improve hip flexion to 110d AROM in seated   Baseline: 110d AAROM  Goal status INITIAL 05/20/2024  7.  Pt will independently ambulate 500' with LRAD within 6 minutes to demonstrate improved weightbearing tolerance, BLE strength, and functional capacity for community ambulation.  Baseline: - 300' FWW  Goal status INITIAL 05/20/2024   PLAN:  PT FREQUENCY: 2x/week  PT DURATION: 8 weeks  PLANNED INTERVENTIONS: 97164- PT Re-evaluation, 97750- Physical Performance Testing, 97110-Therapeutic exercises, 97530- Therapeutic activity, 97112- Neuromuscular re-education, 97535- Self Care, [id number]- Manual therapy, [phone]- Gait training, [phone]- Aquatic Therapy, [phone]- Electrical stimulation (unattended), 97016- Vasopneumatic device, [id number]- Ultrasound, [id number]- Ionotophoresis 4mg /ml Dexamethasone , 79439 (1-2 muscles), [id number] (3+ muscles)- Dry Needling, Patient/Family education, Balance training, Stair training, Taping, Joint mobilization, Spinal mobilization, Vestibular training, Cryotherapy, and Moist heat  PLAN FOR NEXT SESSION: Continue with therapeutic focus on RLE strengthening, quadriceps recruitment,ambulation quality, and transfer quality.    Marvens Hollars April Ma L Krishika Bugge, PT, DPT 05/26/2024, 9:34 AM

## 2024-05-28 ENCOUNTER — Encounter: Payer: Self-pay | Admitting: Physical Therapy

## 2024-05-28 ENCOUNTER — Ambulatory Visit: Attending: Student | Admitting: Physical Therapy

## 2024-05-28 DIAGNOSIS — M25551 Pain in right hip: Secondary | ICD-10-CM | POA: Diagnosis not present

## 2024-05-28 DIAGNOSIS — M79604 Pain in right leg: Secondary | ICD-10-CM | POA: Insufficient documentation

## 2024-05-28 DIAGNOSIS — M6281 Muscle weakness (generalized): Secondary | ICD-10-CM | POA: Diagnosis not present

## 2024-05-28 DIAGNOSIS — R2689 Other abnormalities of gait and mobility: Secondary | ICD-10-CM | POA: Diagnosis not present

## 2024-05-28 NOTE — Therapy (Signed)
 OUTPATIENT PHYSICAL THERAPY TREATMENT   Patient Name: Renee Watts MRN: [id number] DOB:[birth year], 73 y.o., female Today's Date: 05/28/2024  END OF SESSION:  PT End of Session - 05/28/24 0939     Visit Number 12    Number of Visits 20    Date for Recertification  06/10/24    Authorization Type [employer]    Progress Note Due on Visit 10    PT Start Time 0931    PT Stop Time 1010    PT Time Calculation (min) 39 min    Activity Tolerance Patient tolerated treatment well            Past Medical History:  Diagnosis Date   Allergy    Benign paroxysmal positional vertigo 06/25/2013   dx with prior PCP, s/p labs, mri and vestibular rehab; nasal spray for etd seemed to help   Blood in stool 03/11/2012   Dependent edema    Bilateral   Depression    GERD (gastroesophageal reflux disease)    on meds   Hyperlipidemia    on meds   Low back pain    sciatica; sees orthopedic specialist ([facility])for this and shoulder tendinitis   Obesity, diabetes, and hypertension syndrome (HCC)    on med   Osteoarthritis, knee    Bilateral, s/p 3 total knee replacement surgerues on right, last 2005. Dr. Duwayne   Pellegrini-Steida syndrome    Myositis ossificans   Seasonal allergies    Shingles 07/10/2012   LESIONS CLEARED BUT NOT TOLERATING ANY THING TIGHT AGAINST BODY--SHINGLES WERE AROSS BACK AND ABDOMEN   Past Surgical History:  Procedure Laterality Date   ABDOMINAL HYSTERECTOMY  1984   BLADDER SURGERY  03/2017   bladder tac per pt   COLONOSCOPY  2020   MS-MAC-goly (good)-hems/TAx5)   left shoulder steroid injection     ORIF FEMUR FRACTURE Right 12/06/2023   Procedure: OPEN REDUCTION INTERNAL FIXATION (ORIF) DISTAL FEMUR FRACTURE;  Surgeon: Kendal Franky SQUIBB, MD;  Location: [facility] OR;  Service: Orthopedics;  Laterality: Right;   POLYPECTOMY  2020   TA x 5   TONSILLECTOMY  1977   TOTAL HIP ARTHROPLASTY Right 10/10/2012   Procedure: R TOTAL HIP ARTHROPLASTY ANTERIOR  APPROACH;  Surgeon: Lonni CINDERELLA Poli, MD;  Location: [facility];  Service: Orthopedics;  Laterality: Right;  Right Total Hip Arthroplasty, Anterior Approach (C-Arm)   TOTAL KNEE ARTHROPLASTY Right    with multiple revisions, 2003, 2005   TOTAL KNEE ARTHROPLASTY Left 01/09/2013   Procedure: LEFT TOTAL KNEE ARTHROPLASTY;  Surgeon: Lonni CINDERELLA Poli, MD;  Location: [facility];  Service: Orthopedics;  Laterality: Left;   TUBAL LIGATION  1978   WISDOM TOOTH EXTRACTION  2013,2014   Patient Active Problem List   Diagnosis Date Noted   Closed fracture of distal end of right femur, unspecified fracture morphology, initial encounter (HCC) 12/05/2023   Lumbar radiculopathy 09/17/2023   Osteopenia 02/15/2021   Venous stasis of both lower extremities 06/27/2020   Bilateral lower extremity edema 02/05/2020   Recurrent sinusitis 09/03/2018   Vaginal vault prolapse after hysterectomy 02/13/2017   Obesity 01/06/2016   Low back pain 01/06/2016   Normocytic anemia 03/12/2013   Diabetic peripheral neuropathy (HCC) 09/01/2012   Hyperlipemia 09/16/2006   Hypertension associated with diabetes (HCC) 09/16/2006   GERD 09/16/2006   Type II diabetes mellitus with neurological manifestations (HCC) 06/17/2006   Osteoarthritis 06/17/2006   DEPENDENT EDEMA, LEGS, BILATERAL 06/17/2006    PCP: Mercer Clotilda SAUNDERS, MD  REFERRING PROVIDER:  Kendal Franky SQUIBB, MD  REFERRING DIAG: ORIF of right distal fracture performed on 12/06/23  THERAPY DIAG:  Pain in right leg  Pain in right hip  Muscle weakness (generalized)  Other abnormalities of gait and mobility  Rationale for Evaluation and Treatment: Rehabilitation  ONSET DATE: 12/06/23  SUBJECTIVE:   SUBJECTIVE STATEMENT: Continues to report no pain just stiffness. States she wasn't feeling so good this weekend so wasn't able to do as much.    From eval: Pt states she has been doing home health PT since her 12/06/23 hip surgery. Was d/c-ed ~1 month ago. Was  able to walk 500 feet with PT. Pt reports pain is more in the inside of her medial thigh. No pain where the incision is laterally. Has been using quad cane intermittently in the house. Pt is fearful of another fall. Pt notes she gets fatigued with prolonged standing but not limited by pain.   PERTINENT HISTORY: L3/4 laminectomy and PSIF on 09/17/23; neuropathy in legs, bilat knee replacement, R hip replacement   PAIN:  Are you having pain? Yes: NPRS scale: 0 currently; at worst 10/10 Pain location: R medial thigh Pain description: sometimes sharp or achy Aggravating factors: Rolling in bed, stepping wrong, getting up quickly Relieving factors: hydrocodone , ice/heat  PRECAUTIONS: Fall  RED FLAGS: None   WEIGHT BEARING RESTRICTIONS: WBAT with no ROM restrictions per 12/06/23 surgery note  FALLS:  Has patient fallen in last 6 months? Yes. Number of falls 1  -- 12/05/23 resulting in her femur fracture  LIVING ENVIRONMENT: Lives with: lives with their spouse; children and grandchildren are in and out Lives in: House/apartment Stairs: 1 step up to enter Has following equipment at home: Single point cane, Quad cane small base, Walker - 2 wheeled, Grab bars, and walk in shower  OCCUPATION: Retired - loves to [employer] and entertainment  PLOF: Independent  PATIENT GOALS: Return to cooking and normal activities without having to stop and rest  NEXT MD VISIT: 06/04/24 to see PCP  OBJECTIVE:  Note: Objective measures were completed at Evaluation unless otherwise noted.  DIAGNOSTIC FINDINGS: X-rays of the lumbar spine from 03/09/2024 were independently reviewed and interpreted, showing laminectomy defect at L3/4.  Spondylolisthesis at L3/4 that does not shift between flexion and extension views.  Posterior instrumentation at L3 and L4.  No lucency seen around the screws and none of them have backed out.  No fracture or dislocation seen.   PATIENT SURVEYS:  Lower Extremity Functional Score: 33 / 80  = 41.3 %  05/20/2024: 53/80    SENSATION: Neuropathy in bilat LEs -- knee to ankle  EDEMA:  Will swell if legs are down  MUSCLE LENGTH: Hamstrings: See knee AROM below  POSTURE: flexed trunk , weight shift left, and knees flexed  PALPATION: TTP medial knee/adductors  LOWER EXTREMITY ROM:  Active ROM Right eval Left eval Right 05/01/24 Left 05/01/24  Hip flexion sitting 95 PROM WNL in sup At least 110 deg 95 supine 110 supine  Hip extension      Hip abduction      Hip adduction      Hip internal rotation      Hip external rotation      Knee flexion sitting 80 110 85 110  Knee extension sitting -32 PROM can get -10 in sup -10 -9 -6  Ankle dorsiflexion      Ankle plantarflexion      Ankle inversion      Ankle eversion       (  Blank rows = not tested)  LOWER EXTREMITY MMT:   MMT Right eval Left eval  Hip flexion 3- 3+  Hip extension    Hip abduction supine 3+ 3+  Hip adduction    Hip internal rotation    Hip external rotation    Knee flexion 4 4  Knee extension 3- 3+  Ankle dorsiflexion    Ankle plantarflexion    Ankle inversion    Ankle eversion     (Blank rows = not tested)  LOWER EXTREMITY SPECIAL TESTS:  Hip special tests: Belvie (FABER) test: positive  and Thomas test: positive   FUNCTIONAL TESTS:  5 times sit to stand: with UE support 49.86 sec 05/20/2024: 69m, UE support from mat, height parallel with arm chair   : 300' w/ FWW  GAIT: Distance walked: Into clinic Assistive device utilized: Environmental consultant - 2 wheeled Level of assistance: Modified independence Comments: Diminished swing phase on R, bilat steppage gait, diminished heel/toe on R, flexed trunk and knees   [facility] Adult PT Treatment:                                                DATE: 05/28/2024 Therapeutic Exercise: Seated hamstring stretch x 30 Seated piriformis stretch x 30 Seated figure 4 stretch x30 Seated heel slide 2x10 Seated glute iso press down 2x10x3 Seated LAQ  2x10 Therapeutic Activity: Seated knee/hip flexion side tap on 2 step x2 (to fatigue) Double Leg press 25# 2x10 Single leg press 10# x8 on R only (to fatigue)   [facility] Adult PT Treatment:                                                DATE: 05/26/2024 Therapeutic Exercise: Nustep L4-L6 x 8 min, increased resistance every 2 min intermittently with and without UEs Therapeutic Activity: Standing hip abduction 1# 2x10 bilat Standing hip ext 1# 2x10 bilat Standing marching 1# x5 bilat, eccentrics x 5 Standing heel raise x20 Standing counter squat to chair 2x5 Sit<>stand x5 Gait Training: Amb with RW intermittent fast/slow   [facility] Adult PT Treatment:                                                DATE: 05/20/2024 Therapeutic Activity: Objective measures Goal assessment, functional testing Self Care: Pt education POC discussion   [facility] Adult PT Treatment:                                                DATE: 05/15/24 Therapeutic Exercise: Nustep level 5 x 8 mins while gathering subjective and planning session with patient Therapeutic Activity in // bars: Standing hip abduction 2x10 BIL Standing hip extension 2x10 BIL 1/2 foam roll taps BIL x10 (IR on RLE to bring foot back down) 1/2 foam roll step overs leading with RLE (next session) SPC gait training in // bars - cues for form and sequencing - RUE on bars for extra support   [facility] Adult  PT Treatment:                                                DATE: 05/13/24 Therapeutic Exercise: Nustep level 5 x 8 mins while gathering subjective and planning session with patient Therapeutic Activity in // bars: Standing hip abduction 2x10 BIL Standing hip extension 2x10 BIL Sidestepping x2 laps - cues to decrease rotation of hips 1/2 foam roll taps BIL 2x5 (IR on RLE to bring foot back down) STS x5 - cues for form (heavy use of UE, cues for nose over toes, feet underneath)   PATIENT EDUCATION:  Education details: Exam findings, POC, initial  HEP Person educated: Patient Education method: Explanation, Demonstration, and Handouts Education comprehension: verbalized understanding, returned demonstration, and needs further education  HOME EXERCISE PROGRAM: Access Code: [email or web address] URL: [email or web address] Date: 04/23/2024 Prepared by: Nakaiya Beddow April Marie Meena Barrantes  Exercises - Seated Heel Slide  - 1 x daily - 7 x weekly - 2 sets - 10 reps - Seated Quad Set  - 1 x daily - 7 x weekly - 2 sets - 10 reps - 3 sec hold - Seated Hip Abduction with Resistance  - 2 x daily - 7 x weekly - 1 sets - 10 reps - 3 sec hold - Seated Hip Flexion Isometric  - 2 x daily - 7 x weekly - 1 sets - 10 reps - 3 sec hold - Seated Active Hip Flexion  - 2 x daily - 7 x weekly - 1 sets - 10 reps - Beginner Bridge  - 2 x daily - 7 x weekly - 1 sets - 10 reps  ASSESSMENT:  CLINICAL IMPRESSION: Quad remains very weak notable with sitting LAQ. Continued to work on improving hip mobility with stretching. Focused on progressive LE strengthening. Able to work on machines today.   Progress note 05/20/2024: Pt attended physical therapy session for re-evaluation of R hip ORIF. Pt is progressing well and has met 6 goals, pt continues to work towards 2 other initial goals. Difficulties continue with RLE strength, and transfer quality necessary for a safe return to independent lifestyle inline with PLOF. 2 additional goals were added to further progress quality of AROM and ambulation quality necessary for return to higher level community participation. Pt required minimal cuing as well as no assistance for safe and appropriate performance of today's activities. Continue with therapeutic focus on RLE strengthening, quadriceps recruitment,ambulation quality, and transfer quality. Education was given to continue applying ADL education from previous sessions as well as performing HEP as prescribed with freedom to progress as tolerated using previous education on  modification and exercise dosage. Pt has displayed and verbalized competence regarding this education.   Pt continues to require the intervention of skilled outpatient physical therapy to address the aforementioned deficits and progress towards a functional level in line with therapeutic goals.  OBJECTIVE IMPAIRMENTS: Abnormal gait, decreased activity tolerance, decreased balance, decreased coordination, decreased endurance, decreased mobility, difficulty walking, decreased ROM, decreased strength, hypomobility, increased fascial restrictions, impaired flexibility, improper body mechanics, postural dysfunction, and pain.   ACTIVITY LIMITATIONS: carrying, lifting, bending, sitting, standing, squatting, sleeping, stairs, transfers, bed mobility, dressing, hygiene/grooming, and locomotion level  PARTICIPATION LIMITATIONS: meal prep, cleaning, driving, shopping, and community activity  PERSONAL FACTORS: Age, Fitness, Past/current experiences, and Time since onset of injury/illness/exacerbation are also affecting patient's functional outcome.   REHAB  POTENTIAL: Good  CLINICAL DECISION MAKING: Evolving/moderate complexity  EVALUATION COMPLEXITY: Moderate   GOALS: Goals reviewed with patient? Yes  SHORT TERM GOALS: Target date: 05/13/2024  Pt will be ind with initial HEP Baseline: Goal status: MET Pt reports adherence 05/15/24  2.  Pt will have improved hip flexion to at least 110 deg AROM Baseline:  Goal status: MET (in supine, and seated AAROM)  3.  Pt will report improved medial thigh/knee pain by >/=25% Baseline:  Goal status: MET 05/15/24: Pt reports max pain at 6/10   LONG TERM GOALS: Target date: 06/10/2024   Pt will be ind with management and progression of HEP Baseline:  Goal status: MET 05/20/2024  2.  Pt will be able to amb at least 400' with LRAD for increased home and limited community mobility Baseline:  Goal status: MET 05/20/2024 (walked around 500')  3.  Pt will  have improved 5x STS to </=20 sec to reduce her fall risk and increase functional LE strength Baseline:  Goal status: ONGOING 05/20/2024  4.  Pt will have improved R knee ext to within at least 10 deg of full extension to demo increased quad strength Baseline: 32 deg away from full ext Goal status: ONGOING (10d AROM, in sitting, 0 AAROM)  5.  Pt will have improved LEFS score to >/=42/80 to demo MCID Baseline: 33/80 05/20/2024: 53/80 Goal status: MET 05/20/2024  6. Pt will improve hip flexion to 110d AROM in seated   Baseline: 110d AAROM  Goal status INITIAL 05/20/2024  7.  Pt will independently ambulate 500' with LRAD within 6 minutes to demonstrate improved weightbearing tolerance, BLE strength, and functional capacity for community ambulation.  Baseline: - 300' FWW  Goal status INITIAL 05/20/2024   PLAN:  PT FREQUENCY: 2x/week  PT DURATION: 8 weeks  PLANNED INTERVENTIONS: 97164- PT Re-evaluation, 97750- Physical Performance Testing, 97110-Therapeutic exercises, 97530- Therapeutic activity, 97112- Neuromuscular re-education, 97535- Self Care, 02859- Manual therapy, [phone]- Gait training, [phone]- Aquatic Therapy, [phone]- Electrical stimulation (unattended), 97016- Vasopneumatic device, [id number]- Ultrasound, [id number]- Ionotophoresis 4mg /ml Dexamethasone , 79439 (1-2 muscles), 20561 (3+ muscles)- Dry Needling, Patient/Family education, Balance training, Stair training, Taping, Joint mobilization, Spinal mobilization, Vestibular training, Cryotherapy, and Moist heat  PLAN FOR NEXT SESSION: Continue with therapeutic focus on RLE strengthening, quadriceps recruitment,ambulation quality, and transfer quality.    Lorilee Cafarella April Ma L Jamez Ambrocio, PT, DPT 05/28/2024, 9:39 AM

## 2024-06-02 ENCOUNTER — Ambulatory Visit: Admitting: Physical Therapy

## 2024-06-02 DIAGNOSIS — S72461D Displaced supracondylar fracture with intracondylar extension of lower end of right femur, subsequent encounter for closed fracture with routine healing: Secondary | ICD-10-CM | POA: Diagnosis not present

## 2024-06-03 NOTE — Therapy (Signed)
 OUTPATIENT PHYSICAL THERAPY TREATMENT   Patient Name: Renee Watts MRN: [id number] DOB:Oct 06, 1950, 73 y.o., female Today's Date: 06/03/2024  END OF SESSION:      Past Medical History:  Diagnosis Date   Allergy    Benign paroxysmal positional vertigo 06/25/2013   dx with prior PCP, s/p labs, mri and vestibular rehab; nasal spray for etd seemed to help   Blood in stool 03/11/2012   Dependent edema    Bilateral   Depression    GERD (gastroesophageal reflux disease)    on meds   Hyperlipidemia    on meds   Low back pain    sciatica; sees orthopedic specialist ([facility])for this and shoulder tendinitis   Obesity, diabetes, and hypertension syndrome (HCC)    on med   Osteoarthritis, knee    Bilateral, s/p 3 total knee replacement surgerues on right, last 2005. Dr. Duwayne   Pellegrini-Steida syndrome    Myositis ossificans   Seasonal allergies    Shingles 07/10/2012   LESIONS CLEARED BUT NOT TOLERATING ANY THING TIGHT AGAINST BODY--SHINGLES WERE AROSS BACK AND ABDOMEN   Past Surgical History:  Procedure Laterality Date   ABDOMINAL HYSTERECTOMY  1984   BLADDER SURGERY  03/2017   bladder tac per pt   COLONOSCOPY  2020   MS-MAC-goly (good)-hems/TAx5)   left shoulder steroid injection     ORIF FEMUR FRACTURE Right 12/06/2023   Procedure: OPEN REDUCTION INTERNAL FIXATION (ORIF) DISTAL FEMUR FRACTURE;  Surgeon: Kendal Franky SQUIBB, MD;  Location: [facility];  Service: Orthopedics;  Laterality: Right;   POLYPECTOMY  2020   TA x 5   TONSILLECTOMY  1977   TOTAL HIP ARTHROPLASTY Right 10/10/2012   Procedure: R TOTAL HIP ARTHROPLASTY ANTERIOR APPROACH;  Surgeon: Lonni CINDERELLA Poli, MD;  Location: [facility];  Service: Orthopedics;  Laterality: Right;  Right Total Hip Arthroplasty, Anterior Approach (C-Arm)   TOTAL KNEE ARTHROPLASTY Right    with multiple revisions, 2003, 2005   TOTAL KNEE ARTHROPLASTY Left 01/09/2013   Procedure: LEFT TOTAL KNEE ARTHROPLASTY;  Surgeon:  Lonni CINDERELLA Poli, MD;  Location: [facility];  Service: Orthopedics;  Laterality: Left;   TUBAL LIGATION  1978   WISDOM TOOTH EXTRACTION  2013,2014   Patient Active Problem List   Diagnosis Date Noted   Closed fracture of distal end of right femur, unspecified fracture morphology, initial encounter (HCC) 12/05/2023   Lumbar radiculopathy 09/17/2023   Osteopenia 02/15/2021   Venous stasis of both lower extremities 06/27/2020   Bilateral lower extremity edema 02/05/2020   Recurrent sinusitis 09/03/2018   Vaginal vault 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  Rationale for Evaluation and Treatment: Rehabilitation  ONSET DATE: 12/06/23  SUBJECTIVE:   SUBJECTIVE STATEMENT: Continues to report no pain just stiffness. States she wasn't feeling so good this weekend so wasn't able to do as much.    From eval: Pt states she has been doing home health PT since her 12/06/23 hip surgery. Was d/c-ed ~1 month  ago. Was able to walk 500 feet with PT. Pt reports pain is more in the inside of her medial thigh. No pain where the incision is laterally. Has been using quad cane intermittently in the house. Pt is fearful of another fall. Pt notes she gets fatigued with prolonged standing but not limited by pain.   PERTINENT HISTORY: L3/4 laminectomy and PSIF on 09/17/23; neuropathy in legs, bilat knee replacement, R hip replacement   PAIN:  Are  you having pain? Yes: NPRS scale: 0 currently; at worst 10/10 Pain location: R medial thigh Pain description: sometimes sharp or achy Aggravating factors: Rolling in bed, stepping wrong, getting up quickly Relieving factors: hydrocodone , ice/heat  PRECAUTIONS: Fall  RED FLAGS: None   WEIGHT BEARING RESTRICTIONS: WBAT with no ROM restrictions per 12/06/23 surgery note  FALLS:  Has patient fallen in last 6 months? Yes. Number of falls 1  -- 12/05/23 resulting in her femur fracture  LIVING ENVIRONMENT: Lives with: lives with their spouse; children and grandchildren are in and out Lives in: House/apartment Stairs: 1 step up to enter Has following equipment at home: Single point cane, Quad cane small base, Walker - 2 wheeled, Grab bars, and walk in shower  OCCUPATION: Retired - loves to [employer] and entertainment  PLOF: Independent  PATIENT GOALS: Return to cooking and normal activities without having to stop and rest  NEXT MD VISIT: 06/04/24 to see PCP  OBJECTIVE:  Note: Objective measures were completed at Evaluation unless otherwise noted.  DIAGNOSTIC FINDINGS: X-rays of the lumbar spine from 03/09/2024 were independently reviewed and interpreted, showing laminectomy defect at L3/4.  Spondylolisthesis at L3/4 that does not shift between flexion and extension views.  Posterior instrumentation at L3 and L4.  No lucency seen around the screws and none of them have backed out.  No fracture or dislocation seen.   PATIENT SURVEYS:  Lower Extremity Functional Score: 33 / 80 = 41.3 %  05/20/2024: 53/80    SENSATION: Neuropathy in bilat LEs -- knee to ankle  EDEMA:  Will swell if legs are down  MUSCLE LENGTH: Hamstrings: See knee AROM below  POSTURE: flexed trunk , weight shift left, and knees flexed  PALPATION: TTP medial knee/adductors  LOWER EXTREMITY ROM:  Active ROM Right eval Left eval Right 05/01/24 Left 05/01/24  Hip flexion sitting 95 PROM WNL in sup At least 110 deg  95 supine 110 supine  Hip extension      Hip abduction      Hip adduction      Hip internal rotation      Hip external rotation      Knee flexion sitting 80 110 85 110  Knee extension sitting -32 PROM can get -10 in sup -10 -9 -6  Ankle dorsiflexion      Ankle plantarflexion      Ankle inversion      Ankle eversion       (Blank rows = not tested)  LOWER EXTREMITY MMT:   MMT Right eval Left eval  Hip flexion 3- 3+  Hip extension    Hip abduction supine 3+ 3+  Hip adduction    Hip internal rotation    Hip external rotation    Knee flexion 4 4  Knee extension 3- 3+  Ankle dorsiflexion    Ankle plantarflexion    Ankle inversion    Ankle eversion     (Blank rows = not tested)  LOWER EXTREMITY SPECIAL TESTS:  Hip special tests: Belvie (FABER) test: positive  and Thomas test: positive   FUNCTIONAL TESTS:  5 times sit to stand: with UE support 49.86 sec 05/20/2024: 43m, UE support from mat, height parallel with arm chair   : 300' w/ FWW  GAIT: Distance walked: Into clinic Assistive device utilized: Environmental consultant - 2 wheeled Level of assistance: Modified independence Comments: Diminished swing phase on R, bilat steppage gait, diminished heel/toe on R, flexed trunk and knees  [facility] Adult PT Treatment:                                                DATE: 06/05/24 Therapeutic Exercise: Seated hamstring stretch x 30 Seated piriformis stretch x 30 Seated figure 4 stretch x30 Seated heel slide 2x10 Seated glute iso press down 2x10x3 Seated LAQ 2x10 Therapeutic Activity: Seated knee/hip flexion side tap on 2 step x2 (to fatigue) Double Leg press 25# 2x10 Single leg press 10# x8 on R only (to fatigue) Therapeutic Exercise: *** Manual Therapy: *** Neuromuscular re-ed: *** Therapeutic Activity: *** Modalities: *** Self Care: ***   [facility] Adult PT Treatment:                                                DATE: 05/28/2024 Therapeutic Exercise: Seated hamstring  stretch x 30 Seated piriformis stretch x 30 Seated figure 4 stretch x30 Seated heel slide 2x10 Seated glute iso press down 2x10x3 Seated LAQ 2x10 Therapeutic Activity: Seated knee/hip flexion side tap on 2 step x2 (to fatigue) Double Leg press 25# 2x10 Single leg press 10# x8 on R only (to fatigue)   [facility] Adult PT Treatment:                                                DATE: 05/26/2024 Therapeutic Exercise: Nustep L4-L6 x 8 min, increased resistance every 2 min intermittently with and without UEs Therapeutic Activity: Standing hip abduction 1# 2x10 bilat Standing hip ext 1# 2x10 bilat Standing marching 1# x5 bilat, eccentrics x 5 Standing heel raise x20 Standing counter squat to chair 2x5 Sit<>stand x5 Gait Training: Amb with RW intermittent fast/slow  PATIENT EDUCATION:  Education details: Exam findings, POC, initial HEP Person educated: Patient Education method: Explanation, Demonstration, and Handouts Education comprehension: verbalized understanding, returned demonstration, and needs further education  HOME EXERCISE PROGRAM: Access Code: [email or web address] URL: [email or web address] Date: 04/23/2024 Prepared by: Gellen April Marie Nonato  Exercises - Seated Heel Slide  - 1 x daily - 7 x weekly - 2 sets - 10 reps - Seated Quad Set  - 1 x daily - 7 x weekly - 2 sets - 10 reps - 3 sec hold - Seated Hip Abduction with Resistance  - 2 x daily - 7 x weekly - 1 sets - 10 reps - 3 sec hold - Seated Hip Flexion Isometric  - 2 x daily - 7 x weekly - 1 sets - 10 reps - 3 sec hold - Seated Active Hip Flexion  - 2 x daily - 7 x weekly - 1 sets - 10 reps - Beginner Bridge  - 2 x daily - 7 x weekly -  1 sets - 10 reps  ASSESSMENT:  CLINICAL IMPRESSION:   Quad remains very weak notable with sitting LAQ. Continued to work on improving hip mobility with stretching. Focused on progressive LE strengthening. Able to work on machines today.   Progress note 05/20/2024: Pt  attended physical therapy session for re-evaluation of R hip ORIF. Pt is progressing well and has met 6 goals, pt continues to work towards 2 other initial goals. Difficulties continue with RLE strength, and transfer quality necessary for a safe return to independent lifestyle inline with PLOF. 2 additional goals were added to further progress quality of AROM and ambulation quality necessary for return to higher level community participation. Pt required minimal cuing as well as no assistance for safe and appropriate performance of today's activities. Continue with therapeutic focus on RLE strengthening, quadriceps recruitment,ambulation quality, and transfer quality. Education was given to continue applying ADL education from previous sessions as well as performing HEP as prescribed with freedom to progress as tolerated using previous education on modification and exercise dosage. Pt has displayed and verbalized competence regarding this education.   Pt continues to require the intervention of skilled outpatient physical therapy to address the aforementioned deficits and progress towards a functional level in line with therapeutic goals.  OBJECTIVE IMPAIRMENTS: Abnormal gait, decreased activity tolerance, decreased balance, decreased coordination, decreased endurance, decreased mobility, difficulty walking, decreased ROM, decreased strength, hypomobility, increased fascial restrictions, impaired flexibility, improper body mechanics, postural dysfunction, and pain.   ACTIVITY LIMITATIONS: carrying, lifting, bending, sitting, standing, squatting, sleeping, stairs, transfers, bed mobility, dressing, hygiene/grooming, and locomotion level  PARTICIPATION LIMITATIONS: meal prep, cleaning, driving, shopping, and community activity  PERSONAL FACTORS: Age, Fitness, Past/current experiences, and Time since onset of injury/illness/exacerbation are also affecting patient's functional outcome.   REHAB POTENTIAL:  Good  CLINICAL DECISION MAKING: Evolving/moderate complexity  EVALUATION COMPLEXITY: Moderate   GOALS: Goals reviewed with patient? Yes  SHORT TERM GOALS: Target date: 05/13/2024  Pt will be ind with initial HEP Baseline: Goal status: MET Pt reports adherence 05/15/24  2.  Pt will have improved hip flexion to at least 110 deg AROM Baseline:  Goal status: MET (in supine, and seated AAROM)  3.  Pt will report improved medial thigh/knee pain by >/=25% Baseline:  Goal status: MET 05/15/24: Pt reports max pain at 6/10   LONG TERM GOALS: Target date: 06/10/2024   Pt will be ind with management and progression of HEP Baseline:  Goal status: MET 05/20/2024  2.  Pt will be able to amb at least 400' with LRAD for increased home and limited community mobility Baseline:  Goal status: MET 05/20/2024 (walked around 500')  3.  Pt will have improved 5x STS to </=20 sec to reduce her fall risk and increase functional LE strength Baseline:  Goal status: ONGOING 05/20/2024  4.  Pt will have improved R knee ext to within at least 10 deg of full extension to demo increased quad strength Baseline: 32 deg away from full ext Goal status: ONGOING (10d AROM, in sitting, 0 AAROM)  5.  Pt will have improved LEFS score to >/=42/80 to demo MCID Baseline: 33/80 05/20/2024: 53/80 Goal status: MET 05/20/2024  6. Pt will improve hip flexion to 110d AROM in seated   Baseline: 110d AAROM  Goal status INITIAL 05/20/2024  7.  Pt will independently ambulate 500' with LRAD within 6 minutes to demonstrate improved weightbearing tolerance, BLE strength, and functional capacity for community ambulation.  Baseline: - 300' FWW  Goal status  INITIAL 05/20/2024   PLAN:  PT FREQUENCY: 2x/week  PT DURATION: 8 weeks  PLANNED INTERVENTIONS: 97164- PT Re-evaluation, 97750- Physical Performance Testing, 97110-Therapeutic exercises, 97530- Therapeutic activity, 97112- Neuromuscular re-education, 97535- Self  Care, 02859- Manual therapy, [phone]- Gait training, [phone]- Aquatic Therapy, [phone]- Electrical stimulation (unattended), 97016- Vasopneumatic device, [id number]- Ultrasound, [id number]- Ionotophoresis 4mg /ml Dexamethasone , 79439 (1-2 muscles), [id number] (3+ muscles)- Dry Needling, Patient/Family education, Balance training, Stair training, Taping, Joint mobilization, Spinal mobilization, Vestibular training, Cryotherapy, and Moist heat  PLAN FOR NEXT SESSION: Continue with therapeutic focus on RLE strengthening, quadriceps recruitment,ambulation quality, and transfer quality.    Dasie Daft, PT, DPT 06/03/2024, 4:50 PM

## 2024-06-04 ENCOUNTER — Ambulatory Visit: Admitting: Family Medicine

## 2024-06-04 ENCOUNTER — Encounter: Payer: Self-pay | Admitting: Family Medicine

## 2024-06-04 VITALS — BP 122/76 | HR 64 | Temp 98.2°F | Ht 65.0 in | Wt 153.2 lb

## 2024-06-04 DIAGNOSIS — E1149 Type 2 diabetes mellitus with other diabetic neurological complication: Secondary | ICD-10-CM | POA: Diagnosis not present

## 2024-06-04 DIAGNOSIS — D509 Iron deficiency anemia, unspecified: Secondary | ICD-10-CM

## 2024-06-04 DIAGNOSIS — Z7984 Long term (current) use of oral hypoglycemic drugs: Secondary | ICD-10-CM | POA: Diagnosis not present

## 2024-06-04 DIAGNOSIS — Z23 Encounter for immunization: Secondary | ICD-10-CM

## 2024-06-04 DIAGNOSIS — F4323 Adjustment disorder with mixed anxiety and depressed mood: Secondary | ICD-10-CM | POA: Diagnosis not present

## 2024-06-04 DIAGNOSIS — I10 Essential (primary) hypertension: Secondary | ICD-10-CM

## 2024-06-04 NOTE — Progress Notes (Signed)
 Established Patient Office Visit   Subjective  Patient ID: Renee Watts, female    DOB: 21-Jun-1951  Age: 73 y.o. MRN: [id number]  Chief Complaint  Patient presents with   Medical Management of Chronic Issues    Patient came in today for 3 month follow-up     Pt is a 72 yo female seen for f/u on chronic conditions.  Endorses insomnia, often not sleeping at all during the night.  Getting a couple of hours of sleep at a time. She attributes some of her sleep issues to boredom and lack of activity. Drinking fluids throughout the night, causing nocturia.  No longer taking zoloft , made her sit down/stare off.  In PT twice a week.  Has pain in R thigh during therapy.  Tries to stay active by walking her driveway multiple times a day and working on leg lifts, for R leg strength although she finds some exercises challenging. She uses a walker for support and is learning to use a cane correctly. Wants to regain full mobility and independence.  Followed by Dr. Keturah, Ortho.  Gabapentin  is not effectively managing neuropathy and restless legs. Taking gabapentin  one in the morning, one midday, and two at night.  A1c was 6.7% in July.  Wants to lower it below 6%. No recent high blood sugar readings.  Taking Jardiance .  Also taking vitamin D  weekly.  D/c'd iron supplements, hemoglobin was 13 in July.  Pt has an appt in few wks for cataracts.  Socially, she feels bored and isolated, wanting to engage in more activities such as driving and socializing.  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    Patient Active Problem List   Diagnosis Date Noted   Closed fracture of distal end of right femur, unspecified fracture morphology, initial encounter (HCC) 12/05/2023   Lumbar radiculopathy 09/17/2023   Osteopenia 02/15/2021   Venous stasis of both lower extremities 06/27/2020   Bilateral lower  extremity edema 02/05/2020   Recurrent sinusitis 09/03/2018   Vaginal vault prolapse after hysterectomy 02/13/2017   Obesity 01/06/2016   Low back pain 01/06/2016   Normocytic anemia 03/12/2013   Diabetic peripheral neuropathy (HCC) 09/01/2012   Hyperlipemia 09/16/2006   Hypertension associated with diabetes (HCC) 09/16/2006   GERD 09/16/2006   Type II diabetes mellitus with neurological manifestations (HCC) 06/17/2006   Osteoarthritis 06/17/2006   DEPENDENT EDEMA, LEGS, BILATERAL 06/17/2006   Past Medical History:  Diagnosis Date   Allergy    Benign paroxysmal positional vertigo 06/25/2013   dx with prior PCP, s/p labs, mri and vestibular rehab; nasal spray for etd seemed to help   Blood in stool 03/11/2012   Dependent edema    Bilateral   Depression    GERD (gastroesophageal reflux disease)    on meds   Hyperlipidemia    on meds   Low back pain    sciatica; sees orthopedic specialist ([facility])for this and shoulder tendinitis   Obesity, diabetes, and hypertension syndrome (HCC)    on med   Osteoarthritis, knee    Bilateral, s/p 3 total knee replacement surgerues on right, last 2005. Dr. Duwayne   Pellegrini-Steida syndrome    Myositis ossificans   Seasonal allergies    Shingles 07/10/2012   LESIONS CLEARED BUT NOT TOLERATING ANY THING TIGHT AGAINST BODY--SHINGLES WERE AROSS BACK AND ABDOMEN   Past Surgical History:  Procedure Laterality Date  ABDOMINAL HYSTERECTOMY  1984   BLADDER SURGERY  03/2017   bladder tac per pt   COLONOSCOPY  2020   MS-MAC-goly (good)-hems/TAx5)   left shoulder steroid injection     ORIF FEMUR FRACTURE Right 12/06/2023   Procedure: OPEN REDUCTION INTERNAL FIXATION (ORIF) DISTAL FEMUR FRACTURE;  Surgeon: Kendal Franky SQUIBB, MD;  Location: [facility];  Service: Orthopedics;  Laterality: Right;   POLYPECTOMY  2020   TA x 5   TONSILLECTOMY  1977   TOTAL HIP ARTHROPLASTY Right 10/10/2012   Procedure: R TOTAL HIP ARTHROPLASTY ANTERIOR APPROACH;   Surgeon: Lonni CINDERELLA Poli, MD;  Location: [facility];  Service: Orthopedics;  Laterality: Right;  Right Total Hip Arthroplasty, Anterior Approach (C-Arm)   TOTAL KNEE ARTHROPLASTY Right    with multiple revisions, 2003, 2005   TOTAL KNEE ARTHROPLASTY Left 01/09/2013   Procedure: LEFT TOTAL KNEE ARTHROPLASTY;  Surgeon: Lonni CINDERELLA Poli, MD;  Location: [facility];  Service: Orthopedics;  Laterality: Left;   TUBAL LIGATION  1978   WISDOM TOOTH EXTRACTION  2013,2014   Social History   Tobacco Use   Smoking status: Never   Smokeless tobacco: Never  Vaping Use   Vaping status: Never Used  Substance Use Topics   Alcohol  use: No   Drug use: No   Family History  Problem Relation Age of Onset   Diabetes Mother    Hypertension Mother    Hypertension Father    Cancer Father        Lung CA   Coronary artery disease Father    Colon cancer Neg Hx    Colon polyps Neg Hx    Esophageal cancer Neg Hx    Rectal cancer Neg Hx    Stomach cancer Neg Hx    No Known Allergies  ROS Negative unless stated above    Objective:     BP 122/76 (BP Location: Left Arm, Patient Position: Sitting, Cuff Size: Large)   Pulse 64   Temp 98.2 F (36.8 C) (Oral)   Ht 5' 5 (1.651 m)   Wt 153 lb 3.2 oz (69.5 kg)   SpO2 99%   BMI 25.49 kg/m  BP Readings from Last 3 Encounters:  06/04/24 122/76  03/04/24 108/72  12/25/23 126/72   Wt Readings from Last 3 Encounters:  06/04/24 153 lb 3.2 oz (69.5 kg)  03/20/24 153 lb (69.4 kg)  03/04/24 153 lb 6.4 oz (69.6 kg)      Physical Exam Constitutional:      General: She is not in acute distress.    Appearance: Normal appearance.  HENT:     Head: Normocephalic and atraumatic.     Nose: Nose normal.     Mouth/Throat:     Mouth: Mucous membranes are moist.  Cardiovascular:     Rate and Rhythm: Normal rate and regular rhythm.     Heart sounds: Normal heart sounds. No murmur heard.    No gallop.  Pulmonary:     Effort: Pulmonary effort is  normal. No respiratory distress.     Breath sounds: Normal breath sounds. No wheezing, rhonchi or rales.  Skin:    General: Skin is warm and dry.  Neurological:     Mental Status: She is alert and oriented to person, place, and time.     Cranial Nerves: Cranial nerves 2-12 are intact.     Comments: Ambulating with a walker        06/04/2024   10:58 AM 03/20/2024    8:53 AM 03/04/2024  10:33 AM  Depression screen PHQ 2/9  Decreased Interest 1 0 2  Down, Depressed, Hopeless 1 0 2  PHQ - 2 Score 2 0 4  Altered sleeping 2 0 2  Tired, decreased energy 2 0 2  Change in appetite 2 0 1  Feeling bad or failure about yourself  0 0 1  Trouble concentrating 2 0 1  Moving slowly or fidgety/restless 0 0 0  Suicidal thoughts 0 0 0  PHQ-9 Score 10 0 11      06/04/2024   10:58 AM 03/04/2024   10:33 AM 12/25/2023   11:47 AM 11/27/2023    3:24 PM  GAD 7 : Generalized Anxiety Score  Nervous, Anxious, on Edge 1 1 1  0  Control/stop worrying 1 1 1  0  Worry too much - different things 1 1 1  0  Trouble relaxing 1 1 1 2   Restless 1 0 1 0  Easily annoyed or irritable 1 1 1  0  Afraid - awful might happen 1 2 1  0  Total GAD 7 Score 7 7 7 2   Anxiety Difficulty Somewhat difficult        No results found for any visits on 06/04/24.    Assessment & Plan:   Type II diabetes mellitus with neurological manifestations (HCC)  Essential hypertension  Iron deficiency anemia, unspecified iron deficiency anemia type  Adjustment disorder with mixed anxiety and depressed mood  Need for influenza vaccination -     Flu vaccine HIGH DOSE PF(Fluzone Trivalent)  DM II-year-old.  Hemoglobin A1c 6.7% on 03/04/2024.  Continue current medications including Jardiance  10 mg daily.  On ACE I.  Consider statin.  Increase gabapentin  to 600 mg 3 times daily.  May cause drowsiness, if noted notify clinic.  discontinue iron as hemoglobin 13 on 03/04/2024.  Monitor for s/s of anemia.  No longer taking Zoloft  25, will d/c.  PHQ  9 sore 10 and GAD 7 score 7.  Continue to monitor for increased symptoms.  BP controlled.  Lisinopril  20 mg.  Flu vac given.  Return in about 8 weeks (around 07/30/2024).   Clotilda JONELLE Single, MD

## 2024-06-04 NOTE — Patient Instructions (Addendum)
 You can increase your Gabapentin  to 600 mg three times a day.  If the medication causes drowsiness you can return to you current dosing and notify clinic.     Don't forget to start working on you event.

## 2024-06-05 ENCOUNTER — Ambulatory Visit

## 2024-06-05 DIAGNOSIS — M25551 Pain in right hip: Secondary | ICD-10-CM

## 2024-06-05 DIAGNOSIS — R2689 Other abnormalities of gait and mobility: Secondary | ICD-10-CM

## 2024-06-05 DIAGNOSIS — M79604 Pain in right leg: Secondary | ICD-10-CM

## 2024-06-05 DIAGNOSIS — M6281 Muscle weakness (generalized): Secondary | ICD-10-CM

## 2024-06-06 ENCOUNTER — Other Ambulatory Visit: Payer: Self-pay | Admitting: Family Medicine

## 2024-06-06 DIAGNOSIS — E559 Vitamin D deficiency, unspecified: Secondary | ICD-10-CM

## 2024-06-09 ENCOUNTER — Ambulatory Visit: Admitting: Physical Therapy

## 2024-06-09 ENCOUNTER — Encounter: Payer: Self-pay | Admitting: Physical Therapy

## 2024-06-09 DIAGNOSIS — M6281 Muscle weakness (generalized): Secondary | ICD-10-CM

## 2024-06-09 DIAGNOSIS — M25551 Pain in right hip: Secondary | ICD-10-CM

## 2024-06-09 DIAGNOSIS — R2689 Other abnormalities of gait and mobility: Secondary | ICD-10-CM

## 2024-06-09 DIAGNOSIS — M79604 Pain in right leg: Secondary | ICD-10-CM

## 2024-06-09 NOTE — Therapy (Signed)
 OUTPATIENT PHYSICAL THERAPY TREATMENT   Patient Name: Renee Watts MRN: [id number] DOB:Oct 22, 1950, 73 y.o., female Today's Date: 06/09/2024  END OF SESSION:  PT End of Session - 06/09/24 1520     Visit Number 14    Number of Visits 20    Date for Recertification  07/21/24    Authorization Type [employer]    PT Start Time 1453    PT Stop Time 1525    PT Time Calculation (min) 32 min    Activity Tolerance Patient tolerated treatment well    Behavior During Therapy [facility] for tasks assessed/performed              Past Medical History:  Diagnosis Date   Allergy    Benign paroxysmal positional vertigo 06/25/2013   dx with prior PCP, s/p labs, mri and vestibular rehab; nasal spray for etd seemed to help   Blood in stool 03/11/2012   Dependent edema    Bilateral   Depression    GERD (gastroesophageal reflux disease)    on meds   Hyperlipidemia    on meds   Low back pain    sciatica; sees orthopedic specialist ([facility])for this and shoulder tendinitis   Obesity, diabetes, and hypertension syndrome (HCC)    on med   Osteoarthritis, knee    Bilateral, s/p 3 total knee replacement surgerues on right, last 2005. Dr. Duwayne   Pellegrini-Steida syndrome    Myositis ossificans   Seasonal allergies    Shingles 07/10/2012   LESIONS CLEARED BUT NOT TOLERATING ANY THING TIGHT AGAINST BODY--SHINGLES WERE AROSS BACK AND ABDOMEN   Past Surgical History:  Procedure Laterality Date   ABDOMINAL HYSTERECTOMY  1984   BLADDER SURGERY  03/2017   bladder tac per pt   COLONOSCOPY  2020   MS-MAC-goly (good)-hems/TAx5)   left shoulder steroid injection     ORIF FEMUR FRACTURE Right 12/06/2023   Procedure: OPEN REDUCTION INTERNAL FIXATION (ORIF) DISTAL FEMUR FRACTURE;  Surgeon: Kendal Franky SQUIBB, MD;  Location: [facility] OR;  Service: Orthopedics;  Laterality: Right;   POLYPECTOMY  2020   TA x 5   TONSILLECTOMY  1977   TOTAL HIP ARTHROPLASTY Right 10/10/2012   Procedure: R  TOTAL HIP ARTHROPLASTY ANTERIOR APPROACH;  Surgeon: Lonni CINDERELLA Poli, MD;  Location: [facility];  Service: Orthopedics;  Laterality: Right;  Right Total Hip Arthroplasty, Anterior Approach (C-Arm)   TOTAL KNEE ARTHROPLASTY Right    with multiple revisions, 2003, 2005   TOTAL KNEE ARTHROPLASTY Left 01/09/2013   Procedure: LEFT TOTAL KNEE ARTHROPLASTY;  Surgeon: Lonni CINDERELLA Poli, MD;  Location: [facility];  Service: Orthopedics;  Laterality: Left;   TUBAL LIGATION  1978   WISDOM TOOTH EXTRACTION  2013,2014   Patient Active Problem List   Diagnosis Date Noted   Closed fracture of distal end of right femur, unspecified fracture morphology, initial encounter (HCC) 12/05/2023   Lumbar radiculopathy 09/17/2023   Osteopenia 02/15/2021   Venous stasis of both lower extremities 06/27/2020   Bilateral lower extremity edema 02/05/2020   Recurrent sinusitis 09/03/2018   Vaginal vault prolapse after hysterectomy 02/13/2017   Obesity 01/06/2016   Low back pain 01/06/2016   Normocytic anemia 03/12/2013   Diabetic peripheral neuropathy (HCC) 09/01/2012   Hyperlipemia 09/16/2006   Hypertension associated with diabetes (HCC) 09/16/2006   GERD 09/16/2006   Type II diabetes mellitus with neurological manifestations (HCC) 06/17/2006   Osteoarthritis 06/17/2006   DEPENDENT EDEMA, LEGS, BILATERAL 06/17/2006    PCP: Mercer Clotilda SAUNDERS, MD  REFERRING PROVIDER: Kendal Franky SQUIBB, MD  REFERRING DIAG: ORIF of right distal fracture performed on 12/06/23  THERAPY DIAG:  Pain in right leg - Plan: PT plan of care cert/re-cert  Pain in right hip - Plan: PT plan of care cert/re-cert  Muscle weakness (generalized) - Plan: PT plan of care cert/re-cert  Other abnormalities of gait and mobility - Plan: PT plan of care cert/re-cert  Rationale for Evaluation and Treatment: Rehabilitation  ONSET DATE: 12/06/23  SUBJECTIVE:   SUBJECTIVE STATEMENT: Pt attended today's session with reports of minimal pain. Pt  stated that they have maintained good compliance with current HEP.  Feeling a bit stronger, has been able to be more active with cooking and cleaning around the house.   From eval: Pt states she has been doing home health PT since her 12/06/23 hip surgery. Was d/c-ed ~1 month ago. Was able to walk 500 feet with PT. Pt reports pain is more in the inside of her medial thigh. No pain where the incision is laterally. Has been using quad cane intermittently in the house. Pt is fearful of another fall. Pt notes she gets fatigued with prolonged standing but not limited by pain.   PERTINENT HISTORY: L3/4 laminectomy and PSIF on 09/17/23; neuropathy in legs, bilat knee replacement, R hip replacement   PAIN:  Are you having pain? Yes: NPRS scale: 6 currently for thigh muscles; at worst 10/10 Pain location: R medial thigh Pain description: sometimes sharp or achy Aggravating factors: Rolling in bed, stepping wrong, getting up quickly Relieving factors: hydrocodone , ice/heat  PRECAUTIONS: Fall  RED FLAGS: None   WEIGHT BEARING RESTRICTIONS: WBAT with no ROM restrictions per 12/06/23 surgery note  FALLS:  Has patient fallen in last 6 months? Yes. Number of falls 1  -- 12/05/23 resulting in her femur fracture  LIVING ENVIRONMENT: Lives with: lives with their spouse; children and grandchildren are in and out Lives in: House/apartment Stairs: 1 step up to enter Has following equipment at home: Single point cane, Quad cane small base, Walker - 2 wheeled, Grab bars, and walk in shower  OCCUPATION: Retired - loves to [employer] and entertainment  PLOF: Independent  PATIENT GOALS: Return to cooking and normal activities without having to stop and rest  NEXT MD VISIT: 06/04/24 to see PCP  OBJECTIVE:  Note: Objective measures were completed at Evaluation unless otherwise noted.  DIAGNOSTIC FINDINGS: X-rays of the lumbar spine from 03/09/2024 were independently reviewed and interpreted, showing laminectomy  defect at L3/4.  Spondylolisthesis at L3/4 that does not shift between flexion and extension views.  Posterior instrumentation at L3 and L4.  No lucency seen around the screws and none of them have backed out.  No fracture or dislocation seen.   PATIENT SURVEYS:  Lower Extremity Functional Score: 33 / 80 = 41.3 %  05/20/2024: 53/80    SENSATION: Neuropathy in bilat LEs -- knee to ankle  EDEMA:  Will swell if legs are down  MUSCLE LENGTH: Hamstrings: See knee AROM below  POSTURE: flexed trunk , weight shift left, and knees flexed  PALPATION: TTP medial knee/adductors  LOWER EXTREMITY ROM:  Active ROM Right eval Left eval Right 05/01/24 Left 05/01/24  Hip flexion sitting 95 PROM WNL in sup At least 110 deg 95 supine 110 supine  Hip extension      Hip abduction      Hip adduction      Hip internal rotation      Hip external rotation  Knee flexion sitting 80 110 85 110  Knee extension sitting -32 PROM can get -10 in sup -10 -9 -6  Ankle dorsiflexion      Ankle plantarflexion      Ankle inversion      Ankle eversion       (Blank rows = not tested)  LOWER EXTREMITY MMT:   MMT Right eval Left eval  Hip flexion 3- 3+  Hip extension    Hip abduction supine 3+ 3+  Hip adduction    Hip internal rotation    Hip external rotation    Knee flexion 4 4  Knee extension 3- 3+  Ankle dorsiflexion    Ankle plantarflexion    Ankle inversion    Ankle eversion     (Blank rows = not tested)  LOWER EXTREMITY SPECIAL TESTS:  Hip special tests: Belvie (FABER) test: positive  and Thomas test: positive   FUNCTIONAL TESTS:  5 times sit to stand: with UE support 49.86 sec 05/20/2024: 12m, UE support from mat, height parallel with arm chair : 300' w/ FWW  GAIT: Distance walked: Into clinic Assistive device utilized: Environmental consultant - 2 wheeled Level of assistance: Modified independence Comments: Diminished swing phase on R, bilat steppage gait, diminished heel/toe on R, flexed  trunk and knees  [facility] Adult PT Treatment:                                                DATE: 06/09/2024  Therapeutic Activity: NuStep 8'  Supine marching against YTB 2x12, hold 2s SAQ 1x12 RLE, hold 3s, STS 1x8 Treatment limited d/t pt attending 10 minutes late   [facility] Adult PT Treatment:                                                DATE: 06/05/24 Therapeutic Exercise: Seated quad set x10 5 Seated marching, x10 each, partial R Partial LAQ 2x10 Therapeutic Activity: Nustep 7 mins L3 UE/LE Standing in RW TKE R, RTB, 2x10 Standing in RW marching, x10 each, partial R Standing in RW hip abd, x10 each. 3# R LE. 0# L LE STS x5  [facility] Adult PT Treatment:                                                DATE: 05/28/2024 Therapeutic Exercise: Seated hamstring stretch x 30 Seated piriformis stretch x 30 Seated figure 4 stretch x30 Seated heel slide 2x10 Seated glute iso press down 2x10x3 Seated LAQ 2x10 Therapeutic Activity: Seated knee/hip flexion side tap on 2 step x2 (to fatigue) Double Leg press 25# 2x10 Single leg press 10# x8 on R only (to fatigue)  PATIENT EDUCATION:  Education details: Exam findings, POC, initial HEP Person educated: Patient Education method: Explanation, Demonstration, and Handouts Education comprehension: verbalized understanding, returned demonstration, and needs further education  HOME EXERCISE PROGRAM: Access Code: [email or web address] URL: [email or web address] Date: 04/23/2024 Prepared by: Gellen April Marie Nonato  Exercises - Seated Heel Slide  - 1 x daily - 7 x weekly - 2 sets - 10 reps - Seated Quad Set  - 1 x daily -  7 x weekly - 2 sets - 10 reps - 3 sec hold - Seated Hip Abduction with Resistance  - 2 x daily - 7 x weekly - 1 sets - 10 reps - 3 sec hold - Seated Hip Flexion Isometric  - 2 x daily - 7 x weekly - 1 sets - 10 reps - 3 sec hold - Seated Active Hip Flexion  - 2 x daily - 7 x weekly - 1 sets - 10 reps - Beginner  Bridge  - 2 x daily - 7 x weekly - 1 sets - 10 reps  ASSESSMENT:  CLINICAL IMPRESSION: Pt attended physical therapy session late for continuation of treatment regarding R hip ORIF. Today's treatment focused on improvement of  proximal hip strengthening, activity tolerance, R hip/knee ROM, and quadriceps recruitment. Pt is progressing with strength, able to perform hip flexion in supine against resistance, and can un-weight R hip off of plinth in seated. Pt showed good tolerance to administered treatment with no adverse effects by the end of session. Skilled intervention was utilized via activity modification for pt tolerance with task completion, functional progression/regression promoting best outcomes inline with current rehab goals, as well as minimal verbal/tactile cuing alongside no physical assistance for safe and appropriate performance of today's activities. Continue with therapeutic focus on current POC outline. Re-cert was sent at end of session today.   Progress note 05/20/2024: Pt attended physical therapy session for re-evaluation of R hip ORIF. Pt is progressing well and has met 6 goals, pt continues to work towards 2 other initial goals. Difficulties continue with RLE strength, and transfer quality necessary for a safe return to independent lifestyle inline with PLOF. 2 additional goals were added to further progress quality of AROM and ambulation quality necessary for return to higher level community participation. Pt required minimal cuing as well as no assistance for safe and appropriate performance of today's activities. Continue with therapeutic focus on RLE strengthening, quadriceps recruitment,ambulation quality, and transfer quality. Education was given to continue applying ADL education from previous sessions as well as performing HEP as prescribed with freedom to progress as tolerated using previous education on modification and exercise dosage. Pt has displayed and verbalized  competence regarding this education.   Pt continues to require the intervention of skilled outpatient physical therapy to address the aforementioned deficits and progress towards a functional level in line with therapeutic goals.  OBJECTIVE IMPAIRMENTS: Abnormal gait, decreased activity tolerance, decreased balance, decreased coordination, decreased endurance, decreased mobility, difficulty walking, decreased ROM, decreased strength, hypomobility, increased fascial restrictions, impaired flexibility, improper body mechanics, postural dysfunction, and pain.   ACTIVITY LIMITATIONS: carrying, lifting, bending, sitting, standing, squatting, sleeping, stairs, transfers, bed mobility, dressing, hygiene/grooming, and locomotion level  PARTICIPATION LIMITATIONS: meal prep, cleaning, driving, shopping, and community activity  PERSONAL FACTORS: Age, Fitness, Past/current experiences, and Time since onset of injury/illness/exacerbation are also affecting patient's functional outcome.   REHAB POTENTIAL: Good  CLINICAL DECISION MAKING: Evolving/moderate complexity  EVALUATION COMPLEXITY: Moderate   GOALS: Goals reviewed with patient? Yes  SHORT TERM GOALS: Target date: 05/13/2024  Pt will be ind with initial HEP Baseline: Goal status: MET Pt reports adherence 05/15/24  2.  Pt will have improved hip flexion to at least 110 deg AROM Baseline:  Goal status: MET (in supine, and seated AAROM)  3.  Pt will report improved medial thigh/knee pain by >/=25% Baseline:  Goal status: MET 05/15/24: Pt reports max pain at 6/10   LONG TERM GOALS: Target date: 06/10/2024  Pt will be ind with management and progression of HEP Baseline:  Goal status: MET 05/20/2024  2.  Pt will be able to amb at least 400' with LRAD for increased home and limited community mobility Baseline:  Goal status: MET 05/20/2024 (walked around 500')  3.  Pt will have improved 5x STS to </=20 sec to reduce her fall risk and  increase functional LE strength Baseline:  Goal status: ONGOING 05/20/2024  4.  Pt will have improved R knee ext to within at least 10 deg of full extension to demo increased quad strength Baseline: 32 deg away from full ext Goal status: ONGOING (10d AROM, in sitting, 0 AAROM)  5.  Pt will have improved LEFS score to >/=42/80 to demo MCID Baseline: 33/80 05/20/2024: 53/80 Goal status: MET 05/20/2024  6. Pt will improve hip flexion to 110d AROM in seated   Baseline: 110d AAROM  Goal status INITIAL 05/20/2024  7.  Pt will independently ambulate 500' with LRAD within 6 minutes to demonstrate improved weightbearing tolerance, BLE strength, and functional capacity for community ambulation.  Baseline: - 300' FWW  Goal status INITIAL 05/20/2024   PLAN:  PT FREQUENCY: 2x/week  PT DURATION: 8 weeks  PLANNED INTERVENTIONS: 97164- PT Re-evaluation, 97750- Physical Performance Testing, 97110-Therapeutic exercises, 97530- Therapeutic activity, 97112- Neuromuscular re-education, 97535- Self Care, 02859- Manual therapy, [phone]- Gait training, [phone]- Aquatic Therapy, [phone]- Electrical stimulation (unattended), 97016- Vasopneumatic device, [id number]- Ultrasound, [id number]- Ionotophoresis 4mg /ml Dexamethasone , 79439 (1-2 muscles), 20561 (3+ muscles)- Dry Needling, Patient/Family education, Balance training, Stair training, Taping, Joint mobilization, Spinal mobilization, Vestibular training, Cryotherapy, and Moist heat  PLAN FOR NEXT SESSION: Continue with therapeutic focus on RLE strengthening, quadriceps recruitment,ambulation quality, and transfer quality.   Allen Ralls MS, PT 06/09/24 3:27 PM

## 2024-06-11 ENCOUNTER — Telehealth: Payer: Self-pay | Admitting: Family Medicine

## 2024-06-11 ENCOUNTER — Ambulatory Visit: Admitting: Physical Therapy

## 2024-06-11 ENCOUNTER — Encounter: Payer: Self-pay | Admitting: Physical Therapy

## 2024-06-11 DIAGNOSIS — M79604 Pain in right leg: Secondary | ICD-10-CM | POA: Diagnosis not present

## 2024-06-11 DIAGNOSIS — M25551 Pain in right hip: Secondary | ICD-10-CM

## 2024-06-11 DIAGNOSIS — M6281 Muscle weakness (generalized): Secondary | ICD-10-CM

## 2024-06-11 DIAGNOSIS — R2689 Other abnormalities of gait and mobility: Secondary | ICD-10-CM

## 2024-06-11 NOTE — Therapy (Signed)
 OUTPATIENT PHYSICAL THERAPY TREATMENT   Patient Name: Renee Watts MRN: [id number] DOB:May 09, 1951, 73 y.o., female Today's Date: 06/11/2024  END OF SESSION:  PT End of Session - 06/11/24 0851     Visit Number 15    Number of Visits 20    Date for Recertification  07/21/24    PT Start Time 0830    PT Stop Time 0910    PT Time Calculation (min) 40 min    Activity Tolerance Patient tolerated treatment well    Behavior During Therapy [facility] for tasks assessed/performed               Past Medical History:  Diagnosis Date   Allergy    Benign paroxysmal positional vertigo 06/25/2013   dx with prior PCP, s/p labs, mri and vestibular rehab; nasal spray for etd seemed to help   Blood in stool 03/11/2012   Dependent edema    Bilateral   Depression    GERD (gastroesophageal reflux disease)    on meds   Hyperlipidemia    on meds   Low back pain    sciatica; sees orthopedic specialist ([facility])for this and shoulder tendinitis   Obesity, diabetes, and hypertension syndrome (HCC)    on med   Osteoarthritis, knee    Bilateral, s/p 3 total knee replacement surgerues on right, last 2005. Dr. Duwayne   Pellegrini-Steida syndrome    Myositis ossificans   Seasonal allergies    Shingles 07/10/2012   LESIONS CLEARED BUT NOT TOLERATING ANY THING TIGHT AGAINST BODY--SHINGLES WERE AROSS BACK AND ABDOMEN   Past Surgical History:  Procedure Laterality Date   ABDOMINAL HYSTERECTOMY  1984   BLADDER SURGERY  03/2017   bladder tac per pt   COLONOSCOPY  2020   MS-MAC-goly (good)-hems/TAx5)   left shoulder steroid injection     ORIF FEMUR FRACTURE Right 12/06/2023   Procedure: OPEN REDUCTION INTERNAL FIXATION (ORIF) DISTAL FEMUR FRACTURE;  Surgeon: Kendal Franky SQUIBB, MD;  Location: [facility] OR;  Service: Orthopedics;  Laterality: Right;   POLYPECTOMY  2020   TA x 5   TONSILLECTOMY  1977   TOTAL HIP ARTHROPLASTY Right 10/10/2012   Procedure: R TOTAL HIP ARTHROPLASTY ANTERIOR APPROACH;   Surgeon: Lonni CINDERELLA Poli, MD;  Location: [facility];  Service: Orthopedics;  Laterality: Right;  Right Total Hip Arthroplasty, Anterior Approach (C-Arm)   TOTAL KNEE ARTHROPLASTY Right    with multiple revisions, 2003, 2005   TOTAL KNEE ARTHROPLASTY Left 01/09/2013   Procedure: LEFT TOTAL KNEE ARTHROPLASTY;  Surgeon: Lonni CINDERELLA Poli, MD;  Location: [facility];  Service: Orthopedics;  Laterality: Left;   TUBAL LIGATION  1978   WISDOM TOOTH EXTRACTION  2013,2014   Patient Active Problem List   Diagnosis Date Noted   Closed fracture of distal end of right femur, unspecified fracture morphology, initial encounter (HCC) 12/05/2023   Lumbar radiculopathy 09/17/2023   Osteopenia 02/15/2021   Venous stasis of both lower extremities 06/27/2020   Bilateral lower extremity edema 02/05/2020   Recurrent sinusitis 09/03/2018   Vaginal vault prolapse after hysterectomy 02/13/2017   Obesity 01/06/2016   Low back pain 01/06/2016   Normocytic anemia 03/12/2013   Diabetic peripheral neuropathy (HCC) 09/01/2012   Hyperlipemia 09/16/2006   Hypertension associated with diabetes (HCC) 09/16/2006   GERD 09/16/2006   Type II diabetes mellitus with neurological manifestations (HCC) 06/17/2006   Osteoarthritis 06/17/2006   DEPENDENT EDEMA, LEGS, BILATERAL 06/17/2006    PCP: Mercer Clotilda SAUNDERS, MD  REFERRING PROVIDER: Kendal Franky SQUIBB,  MD  REFERRING DIAG: ORIF of right distal fracture performed on 12/06/23  THERAPY DIAG:  Pain in right leg  Pain in right hip  Muscle weakness (generalized)  Other abnormalities of gait and mobility  Rationale for Evaluation and Treatment: Rehabilitation  ONSET DATE: 12/06/23  SUBJECTIVE:   SUBJECTIVE STATEMENT: Pt attended today's session with reports of 0/10 pain. Pt stated that they have maintained great compliance with current HEP.  Feels motivated to keep working, excited to see the progress she's made.    From eval: Pt states she has been doing home  health PT since her 12/06/23 hip surgery. Was d/c-ed ~1 month ago. Was able to walk 500 feet with PT. Pt reports pain is more in the inside of her medial thigh. No pain where the incision is laterally. Has been using quad cane intermittently in the house. Pt is fearful of another fall. Pt notes she gets fatigued with prolonged standing but not limited by pain.   PERTINENT HISTORY: L3/4 laminectomy and PSIF on 09/17/23; neuropathy in legs, bilat knee replacement, R hip replacement   PAIN:  Are you having pain? Yes: NPRS scale: 6 currently for thigh muscles; at worst 10/10 Pain location: R medial thigh Pain description: sometimes sharp or achy Aggravating factors: Rolling in bed, stepping wrong, getting up quickly Relieving factors: hydrocodone , ice/heat  PRECAUTIONS: Fall  RED FLAGS: None   WEIGHT BEARING RESTRICTIONS: WBAT with no ROM restrictions per 12/06/23 surgery note  FALLS:  Has patient fallen in last 6 months? Yes. Number of falls 1  -- 12/05/23 resulting in her femur fracture  LIVING ENVIRONMENT: Lives with: lives with their spouse; children and grandchildren are in and out Lives in: House/apartment Stairs: 1 step up to enter Has following equipment at home: Single point cane, Quad cane small base, Walker - 2 wheeled, Grab bars, and walk in shower  OCCUPATION: Retired - loves to [employer] and entertainment  PLOF: Independent  PATIENT GOALS: Return to cooking and normal activities without having to stop and rest  NEXT MD VISIT: 06/04/24 to see PCP  OBJECTIVE:  Note: Objective measures were completed at Evaluation unless otherwise noted.  DIAGNOSTIC FINDINGS: X-rays of the lumbar spine from 03/09/2024 were independently reviewed and interpreted, showing laminectomy defect at L3/4.  Spondylolisthesis at L3/4 that does not shift between flexion and extension views.  Posterior instrumentation at L3 and L4.  No lucency seen around the screws and none of them have backed out.  No  fracture or dislocation seen.   PATIENT SURVEYS:  Lower Extremity Functional Score: 33 / 80 = 41.3 %  05/20/2024: 53/80    SENSATION: Neuropathy in bilat LEs -- knee to ankle  EDEMA:  Will swell if legs are down  MUSCLE LENGTH: Hamstrings: See knee AROM below  POSTURE: flexed trunk , weight shift left, and knees flexed  PALPATION: TTP medial knee/adductors  LOWER EXTREMITY ROM:  Active ROM Right eval Left eval Right 05/01/24 Left 05/01/24  Hip flexion sitting 95 PROM WNL in sup At least 110 deg 95 supine 110 supine  Hip extension      Hip abduction      Hip adduction      Hip internal rotation      Hip external rotation      Knee flexion sitting 80 110 85 110  Knee extension sitting -32 PROM can get -10 in sup -10 -9 -6  Ankle dorsiflexion      Ankle plantarflexion      Ankle inversion  Ankle eversion       (Blank rows = not tested)  LOWER EXTREMITY MMT:   MMT Right eval Left eval  Hip flexion 3- 3+  Hip extension    Hip abduction supine 3+ 3+  Hip adduction    Hip internal rotation    Hip external rotation    Knee flexion 4 4  Knee extension 3- 3+  Ankle dorsiflexion    Ankle plantarflexion    Ankle inversion    Ankle eversion     (Blank rows = not tested)  LOWER EXTREMITY SPECIAL TESTS:  Hip special tests: Belvie (FABER) test: positive  and Thomas test: positive   FUNCTIONAL TESTS:  5 times sit to stand: with UE support 49.86 sec 05/20/2024: 59m, UE support from mat, height parallel with arm chair : 300' w/ FWW  GAIT: Distance walked: Into clinic Assistive device utilized: Environmental consultant - 2 wheeled Level of assistance: Modified independence Comments: Diminished swing phase on R, bilat steppage gait, diminished heel/toe on R, flexed trunk and knees  [facility] Adult PT Treatment:                                                DATE: 06/11/2024  Therapeutic Exercise: NuStep 8' Therapeutic Activity: Pball hamstring curl 2x12 SAQ on pball 2x12,  hold 2s Supine marching against YTB 2x12, hold 2 Standing Heel raise 2x15, UE a. PRN Seated toe raises 2x15, hold 2s    [facility] Adult PT Treatment:                                                DATE: 06/09/2024  Therapeutic Activity: NuStep 8'  Supine marching against YTB 2x12, hold 2s SAQ 1x12 RLE, hold 3s, STS 1x8 Treatment limited d/t pt attending 10 minutes late   [facility] Adult PT Treatment:                                                DATE: 06/05/24 Therapeutic Exercise: Seated quad set x10 5 Seated marching, x10 each, partial R Partial LAQ 2x10 Therapeutic Activity: Nustep 7 mins L3 UE/LE Standing in RW TKE R, RTB, 2x10 Standing in RW marching, x10 each, partial R Standing in RW hip abd, x10 each. 3# R LE. 0# L LE STS x5  [facility] Adult PT Treatment:                                                DATE: 05/28/2024 Therapeutic Exercise: Seated hamstring stretch x 30 Seated piriformis stretch x 30 Seated figure 4 stretch x30 Seated heel slide 2x10 Seated glute iso press down 2x10x3 Seated LAQ 2x10 Therapeutic Activity: Seated knee/hip flexion side tap on 2 step x2 (to fatigue) Double Leg press 25# 2x10 Single leg press 10# x8 on R only (to fatigue)  PATIENT EDUCATION:  Education details: Exam findings, POC, initial HEP Person educated: Patient Education method: Explanation, Demonstration, and Handouts Education comprehension: verbalized understanding, returned demonstration, and  needs further education  HOME EXERCISE PROGRAM: Access Code: [email or web address] URL: [email or web address] Date: 04/23/2024 Prepared by: Gellen April Marie Nonato  Exercises - Seated Heel Slide  - 1 x daily - 7 x weekly - 2 sets - 10 reps - Seated Quad Set  - 1 x daily - 7 x weekly - 2 sets - 10 reps - 3 sec hold - Seated Hip Abduction with Resistance  - 2 x daily - 7 x weekly - 1 sets - 10 reps - 3 sec hold - Seated Hip Flexion Isometric  - 2 x daily - 7 x weekly - 1 sets  - 10 reps - 3 sec hold - Seated Active Hip Flexion  - 2 x daily - 7 x weekly - 1 sets - 10 reps - Beginner Bridge  - 2 x daily - 7 x weekly - 1 sets - 10 reps  ASSESSMENT:  CLINICAL IMPRESSION: Pt attended physical therapy session  for continuation of treatment regarding R hip ORIF. Today's treatment focused on improvement of  proximal hip/knee strengthening, Ankle , R hip/knee ROM, and quadriceps recruitment. Pt is progressing with strength, able to perform hip flexion in supine against resistance, and can un-weight R hip off of plinth in seated. Pt showed good tolerance to administered treatment with no adverse effects by the end of session. Skilled intervention was utilized via activity modification for pt tolerance with task completion, functional progression/regression promoting best outcomes inline with current rehab goals, as well as minimal verbal/tactile cuing alongside no physical assistance for safe and appropriate performance of today's activities. Continue with therapeutic focus on current POC outline. Re-cert was sent at end of session today.   Progress note 05/20/2024: Pt attended physical therapy session for re-evaluation of R hip ORIF. Pt is progressing well and has met 6 goals, pt continues to work towards 2 other initial goals. Difficulties continue with RLE strength, and transfer quality necessary for a safe return to independent lifestyle inline with PLOF. 2 additional goals were added to further progress quality of AROM and ambulation quality necessary for return to higher level community participation. Pt required minimal cuing as well as no assistance for safe and appropriate performance of today's activities. Continue with therapeutic focus on RLE strengthening, quadriceps recruitment,ambulation quality, and transfer quality. Education was given to continue applying ADL education from previous sessions as well as performing HEP as prescribed with freedom to progress as tolerated  using previous education on modification and exercise dosage. Pt has displayed and verbalized competence regarding this education.   Pt continues to require the intervention of skilled outpatient physical therapy to address the aforementioned deficits and progress towards a functional level in line with therapeutic goals.  OBJECTIVE IMPAIRMENTS: Abnormal gait, decreased activity tolerance, decreased balance, decreased coordination, decreased endurance, decreased mobility, difficulty walking, decreased ROM, decreased strength, hypomobility, increased fascial restrictions, impaired flexibility, improper body mechanics, postural dysfunction, and pain.   ACTIVITY LIMITATIONS: carrying, lifting, bending, sitting, standing, squatting, sleeping, stairs, transfers, bed mobility, dressing, hygiene/grooming, and locomotion level  PARTICIPATION LIMITATIONS: meal prep, cleaning, driving, shopping, and community activity  PERSONAL FACTORS: Age, Fitness, Past/current experiences, and Time since onset of injury/illness/exacerbation are also affecting patient's functional outcome.   REHAB POTENTIAL: Good  CLINICAL DECISION MAKING: Evolving/moderate complexity  EVALUATION COMPLEXITY: Moderate   GOALS: Goals reviewed with patient? Yes  SHORT TERM GOALS: Target date: 05/13/2024  Pt will be ind with initial HEP Baseline: Goal status: MET Pt reports adherence 05/15/24  2.  Pt will have improved  hip flexion to at least 110 deg AROM Baseline:  Goal status: MET (in supine, and seated AAROM)  3.  Pt will report improved medial thigh/knee pain by >/=25% Baseline:  Goal status: MET 05/15/24: Pt reports max pain at 6/10   LONG TERM GOALS: Target date: 06/10/2024   Pt will be ind with management and progression of HEP Baseline:  Goal status: MET 05/20/2024  2.  Pt will be able to amb at least 400' with LRAD for increased home and limited community mobility Baseline:  Goal status: MET 05/20/2024 (walked  around 500')  3.  Pt will have improved 5x STS to </=20 sec to reduce her fall risk and increase functional LE strength Baseline:  Goal status: ONGOING 05/20/2024  4.  Pt will have improved R knee ext to within at least 10 deg of full extension to demo increased quad strength Baseline: 32 deg away from full ext Goal status: ONGOING (10d AROM, in sitting, 0 AAROM)  5.  Pt will have improved LEFS score to >/=42/80 to demo MCID Baseline: 33/80 05/20/2024: 53/80 Goal status: MET 05/20/2024  6. Pt will improve hip flexion to 110d AROM in seated   Baseline: 110d AAROM  Goal status INITIAL 05/20/2024  7.  Pt will independently ambulate 500' with LRAD within 6 minutes to demonstrate improved weightbearing tolerance, BLE strength, and functional capacity for community ambulation.  Baseline: - 300' FWW  Goal status INITIAL 05/20/2024   PLAN:  PT FREQUENCY: 2x/week  PT DURATION: 8 weeks  PLANNED INTERVENTIONS: 97164- PT Re-evaluation, 97750- Physical Performance Testing, 97110-Therapeutic exercises, 97530- Therapeutic activity, 97112- Neuromuscular re-education, 97535- Self Care, 02859- Manual therapy, [phone]- Gait training, [phone]- Aquatic Therapy, [phone]- Electrical stimulation (unattended), 97016- Vasopneumatic device, [id number]- Ultrasound, [id number]- Ionotophoresis 4mg /ml Dexamethasone , 79439 (1-2 muscles), 20561 (3+ muscles)- Dry Needling, Patient/Family education, Balance training, Stair training, Taping, Joint mobilization, Spinal mobilization, Vestibular training, Cryotherapy, and Moist heat  PLAN FOR NEXT SESSION: Continue with therapeutic focus on RLE strengthening, quadriceps recruitment,ambulation quality, and transfer quality.   Allen Ralls MS, PT 06/11/24 9:11 AM

## 2024-06-11 NOTE — Telephone Encounter (Signed)
 Copied from CRM #[id number]. Topic: Clinical - Prescription Issue >> Jun 11, 2024  2:31 PM Renee Watts HERO wrote: Reason for CRM: Dr Mercer took pt off of potassium chloride  SA (KLOR-CON  M20) 20 MEQ tablet but the pharmacy keeps refilling stated that the Dr has to send something to stop filling.

## 2024-06-15 NOTE — Addendum Note (Signed)
 Addended by: BRIEN SONG A on: 06/15/2024 09:23 AM   Modules accepted: Orders

## 2024-06-16 ENCOUNTER — Encounter: Payer: Self-pay | Admitting: Physical Therapy

## 2024-06-16 ENCOUNTER — Ambulatory Visit: Admitting: Physical Therapy

## 2024-06-16 DIAGNOSIS — M6281 Muscle weakness (generalized): Secondary | ICD-10-CM

## 2024-06-16 DIAGNOSIS — R2689 Other abnormalities of gait and mobility: Secondary | ICD-10-CM

## 2024-06-16 DIAGNOSIS — M79604 Pain in right leg: Secondary | ICD-10-CM | POA: Diagnosis not present

## 2024-06-16 DIAGNOSIS — M25551 Pain in right hip: Secondary | ICD-10-CM

## 2024-06-16 NOTE — Therapy (Signed)
 OUTPATIENT PHYSICAL THERAPY TREATMENT   Patient Name: Renee Watts MRN: [id number] DOB:Jul 06, 1951, 73 y.o., female Today's Date: 06/16/2024  END OF SESSION:  PT End of Session - 06/16/24 0953     Visit Number 16    Number of Visits 20    Date for Recertification  07/21/24    Authorization Type [employer]    PT Start Time 0932    PT Stop Time 1010    PT Time Calculation (min) 38 min    Activity Tolerance Patient tolerated treatment well    Behavior During Therapy [facility] for tasks assessed/performed            Past Medical History:  Diagnosis Date   Allergy    Benign paroxysmal positional vertigo 06/25/2013   dx with prior PCP, s/p labs, mri and vestibular rehab; nasal spray for etd seemed to help   Blood in stool 03/11/2012   Dependent edema    Bilateral   Depression    GERD (gastroesophageal reflux disease)    on meds   Hyperlipidemia    on meds   Low back pain    sciatica; sees orthopedic specialist ([facility])for this and shoulder tendinitis   Obesity, diabetes, and hypertension syndrome (HCC)    on med   Osteoarthritis, knee    Bilateral, s/p 3 total knee replacement surgerues on right, last 2005. Dr. Duwayne   Pellegrini-Steida syndrome    Myositis ossificans   Seasonal allergies    Shingles 07/10/2012   LESIONS CLEARED BUT NOT TOLERATING ANY THING TIGHT AGAINST BODY--SHINGLES WERE AROSS BACK AND ABDOMEN   Past Surgical History:  Procedure Laterality Date   ABDOMINAL HYSTERECTOMY  1984   BLADDER SURGERY  03/2017   bladder tac per pt   COLONOSCOPY  2020   MS-MAC-goly (good)-hems/TAx5)   left shoulder steroid injection     ORIF FEMUR FRACTURE Right 12/06/2023   Procedure: OPEN REDUCTION INTERNAL FIXATION (ORIF) DISTAL FEMUR FRACTURE;  Surgeon: Kendal Franky SQUIBB, MD;  Location: [facility] OR;  Service: Orthopedics;  Laterality: Right;   POLYPECTOMY  2020   TA x 5   TONSILLECTOMY  1977   TOTAL HIP ARTHROPLASTY Right 10/10/2012   Procedure: R TOTAL  HIP ARTHROPLASTY ANTERIOR APPROACH;  Surgeon: Lonni CINDERELLA Poli, MD;  Location: [facility];  Service: Orthopedics;  Laterality: Right;  Right Total Hip Arthroplasty, Anterior Approach (C-Arm)   TOTAL KNEE ARTHROPLASTY Right    with multiple revisions, 2003, 2005   TOTAL KNEE ARTHROPLASTY Left 01/09/2013   Procedure: LEFT TOTAL KNEE ARTHROPLASTY;  Surgeon: Lonni CINDERELLA Poli, MD;  Location: [facility];  Service: Orthopedics;  Laterality: Left;   TUBAL LIGATION  1978   WISDOM TOOTH EXTRACTION  2013,2014   Patient Active Problem List   Diagnosis Date Noted   Closed fracture of distal end of right femur, unspecified fracture morphology, initial encounter (HCC) 12/05/2023   Lumbar radiculopathy 09/17/2023   Osteopenia 02/15/2021   Venous stasis of both lower extremities 06/27/2020   Bilateral lower extremity edema 02/05/2020   Recurrent sinusitis 09/03/2018   Vaginal vault prolapse after hysterectomy 02/13/2017   Obesity 01/06/2016   Low back pain 01/06/2016   Normocytic anemia 03/12/2013   Diabetic peripheral neuropathy (HCC) 09/01/2012   Hyperlipemia 09/16/2006   Hypertension associated with diabetes (HCC) 09/16/2006   GERD 09/16/2006   Type II diabetes mellitus with neurological manifestations (HCC) 06/17/2006   Osteoarthritis 06/17/2006   DEPENDENT EDEMA, LEGS, BILATERAL 06/17/2006    PCP: Mercer Clotilda SAUNDERS, MD  REFERRING  PROVIDER: Kendal Franky SQUIBB, MD  REFERRING DIAG: ORIF of right distal fracture performed on 12/06/23  THERAPY DIAG:  Muscle weakness (generalized)  Other abnormalities of gait and mobility  Pain in right hip  Rationale for Evaluation and Treatment: Rehabilitation  ONSET DATE: 12/06/23  SUBJECTIVE:   SUBJECTIVE STATEMENT: Pt remains highly motivated. Has been walking and wearing out the plastic tips of her walker.   From eval: Pt states she has been doing home health PT since her 12/06/23 hip surgery. Was d/c-ed ~1 month ago. Was able to walk 500 feet  with PT. Pt reports pain is more in the inside of her medial thigh. No pain where the incision is laterally. Has been using quad cane intermittently in the house. Pt is fearful of another fall. Pt notes she gets fatigued with prolonged standing but not limited by pain.   PERTINENT HISTORY: L3/4 laminectomy and PSIF on 09/17/23; neuropathy in legs, bilat knee replacement, R hip replacement   PAIN:  Are you having pain? Yes: NPRS scale: 6 currently for thigh muscles; at worst 10/10 Pain location: R medial thigh Pain description: sometimes sharp or achy Aggravating factors: Rolling in bed, stepping wrong, getting up quickly Relieving factors: hydrocodone , ice/heat  PRECAUTIONS: Fall  RED FLAGS: None   WEIGHT BEARING RESTRICTIONS: WBAT with no ROM restrictions per 12/06/23 surgery note  FALLS:  Has patient fallen in last 6 months? Yes. Number of falls 1  -- 12/05/23 resulting in her femur fracture  LIVING ENVIRONMENT: Lives with: lives with their spouse; children and grandchildren are in and out Lives in: House/apartment Stairs: 1 step up to enter Has following equipment at home: Single point cane, Quad cane small base, Walker - 2 wheeled, Grab bars, and walk in shower  OCCUPATION: Retired - loves to [employer] and entertainment  PLOF: Independent  PATIENT GOALS: Return to cooking and normal activities without having to stop and rest  NEXT MD VISIT: 06/04/24 to see PCP  OBJECTIVE:  Note: Objective measures were completed at Evaluation unless otherwise noted.  DIAGNOSTIC FINDINGS: X-rays of the lumbar spine from 03/09/2024 were independently reviewed and interpreted, showing laminectomy defect at L3/4.  Spondylolisthesis at L3/4 that does not shift between flexion and extension views.  Posterior instrumentation at L3 and L4.  No lucency seen around the screws and none of them have backed out.  No fracture or dislocation seen.   PATIENT SURVEYS:  Lower Extremity Functional Score: 33 / 80 =  41.3 %  05/20/2024: 53/80    SENSATION: Neuropathy in bilat LEs -- knee to ankle  EDEMA:  Will swell if legs are down  MUSCLE LENGTH: Hamstrings: See knee AROM below  POSTURE: flexed trunk , weight shift left, and knees flexed  PALPATION: TTP medial knee/adductors  LOWER EXTREMITY ROM:  Active ROM Right eval Left eval Right 05/01/24 Left 05/01/24  Hip flexion sitting 95 PROM WNL in sup At least 110 deg 95 supine 110 supine  Hip extension      Hip abduction      Hip adduction      Hip internal rotation      Hip external rotation      Knee flexion sitting 80 110 85 110  Knee extension sitting -32 PROM can get -10 in sup -10 -9 -6  Ankle dorsiflexion      Ankle plantarflexion      Ankle inversion      Ankle eversion       (Blank rows = not tested)  LOWER EXTREMITY MMT:   MMT Right eval Left eval  Hip flexion 3- 3+  Hip extension    Hip abduction supine 3+ 3+  Hip adduction    Hip internal rotation    Hip external rotation    Knee flexion 4 4  Knee extension 3- 3+  Ankle dorsiflexion    Ankle plantarflexion    Ankle inversion    Ankle eversion     (Blank rows = not tested)  LOWER EXTREMITY SPECIAL TESTS:  Hip special tests: Belvie (FABER) test: positive  and Thomas test: positive   FUNCTIONAL TESTS:  5 times sit to stand: with UE support 49.86 sec 05/20/2024: 23m, UE support from mat, height parallel with arm chair : 300' w/ FWW  GAIT: Distance walked: Into clinic Assistive device utilized: Environmental consultant - 2 wheeled Level of assistance: Modified independence Comments: Diminished swing phase on R, bilat steppage gait, diminished heel/toe on R, flexed trunk and knees  [facility] Adult PT Treatment:                                                DATE: 06/11/2024  Therapeutic Exercise: NuStep 8' LEs only Sitting LAQ eccentrics R only 2x10 Therapeutic Activity: Sitting step over yoga block 2x8 Sitting lean forward with hands on yoga block and pushing through  heels to lift butt 1 above bed x10 Standing hip hinge with hands hovering RW 2x10 Standing alternating TKE 2x10    [facility] Adult PT Treatment:                                                DATE: 06/09/2024  Therapeutic Activity: NuStep 8'  Supine marching against YTB 2x12, hold 2s SAQ 1x12 RLE, hold 3s, STS 1x8 Treatment limited d/t pt attending 10 minutes late   [facility] Adult PT Treatment:                                                DATE: 06/05/24 Therapeutic Exercise: Seated quad set x10 5 Seated marching, x10 each, partial R Partial LAQ 2x10 Therapeutic Activity: Nustep 7 mins L3 UE/LE Standing in RW TKE R, RTB, 2x10 Standing in RW marching, x10 each, partial R Standing in RW hip abd, x10 each. 3# R LE. 0# L LE STS x5  [facility] Adult PT Treatment:                                                DATE: 05/28/2024 Therapeutic Exercise: Seated hamstring stretch x 30 Seated piriformis stretch x 30 Seated figure 4 stretch x30 Seated heel slide 2x10 Seated glute iso press down 2x10x3 Seated LAQ 2x10 Therapeutic Activity: Seated knee/hip flexion side tap on 2 step x2 (to fatigue) Double Leg press 25# 2x10 Single leg press 10# x8 on R only (to fatigue)  PATIENT EDUCATION:  Education details: Exam findings, POC, initial HEP Person educated: Patient Education method: Explanation, Demonstration, and Handouts Education comprehension: verbalized understanding, returned demonstration,  and needs further education  HOME EXERCISE PROGRAM: Access Code: [email or web address] URL: [email or web address] Date: 04/23/2024 Prepared by: Giulliana Mcroberts April Marie Vail Basista  Exercises - Seated Heel Slide  - 1 x daily - 7 x weekly - 2 sets - 10 reps - Seated Quad Set  - 1 x daily - 7 x weekly - 2 sets - 10 reps - 3 sec hold - Seated Hip Abduction with Resistance  - 2 x daily - 7 x weekly - 1 sets - 10 reps - 3 sec hold - Seated Hip Flexion Isometric  - 2 x daily - 7 x weekly - 1 sets - 10  reps - 3 sec hold - Seated Active Hip Flexion  - 2 x daily - 7 x weekly - 1 sets - 10 reps - Beginner Bridge  - 2 x daily - 7 x weekly - 1 sets - 10 reps  ASSESSMENT:  CLINICAL IMPRESSION: Continued to work on progressing R LE weight shift and hip/knee ext in standing. Working on improving pt's transfers focusing on using her legs and relying less on UEs. Pt remains motivated and compliant.    Progress note 05/20/2024: Pt attended physical therapy session for re-evaluation of R hip ORIF. Pt is progressing well and has met 6 goals, pt continues to work towards 2 other initial goals. Difficulties continue with RLE strength, and transfer quality necessary for a safe return to independent lifestyle inline with PLOF. 2 additional goals were added to further progress quality of AROM and ambulation quality necessary for return to higher level community participation. Pt required minimal cuing as well as no assistance for safe and appropriate performance of today's activities. Continue with therapeutic focus on RLE strengthening, quadriceps recruitment,ambulation quality, and transfer quality. Education was given to continue applying ADL education from previous sessions as well as performing HEP as prescribed with freedom to progress as tolerated using previous education on modification and exercise dosage. Pt has displayed and verbalized competence regarding this education.   Pt continues to require the intervention of skilled outpatient physical therapy to address the aforementioned deficits and progress towards a functional level in line with therapeutic goals.  OBJECTIVE IMPAIRMENTS: Abnormal gait, decreased activity tolerance, decreased balance, decreased coordination, decreased endurance, decreased mobility, difficulty walking, decreased ROM, decreased strength, hypomobility, increased fascial restrictions, impaired flexibility, improper body mechanics, postural dysfunction, and pain.   ACTIVITY  LIMITATIONS: carrying, lifting, bending, sitting, standing, squatting, sleeping, stairs, transfers, bed mobility, dressing, hygiene/grooming, and locomotion level  PARTICIPATION LIMITATIONS: meal prep, cleaning, driving, shopping, and community activity  PERSONAL FACTORS: Age, Fitness, Past/current experiences, and Time since onset of injury/illness/exacerbation are also affecting patient's functional outcome.   REHAB POTENTIAL: Good  CLINICAL DECISION MAKING: Evolving/moderate complexity  EVALUATION COMPLEXITY: Moderate   GOALS: Goals reviewed with patient? Yes  SHORT TERM GOALS: Target date: 05/13/2024  Pt will be ind with initial HEP Baseline: Goal status: MET Pt reports adherence 05/15/24  2.  Pt will have improved hip flexion to at least 110 deg AROM Baseline:  Goal status: MET (in supine, and seated AAROM)  3.  Pt will report improved medial thigh/knee pain by >/=25% Baseline:  Goal status: MET 05/15/24: Pt reports max pain at 6/10   LONG TERM GOALS: Target date: 07/21/2024   Pt will be ind with management and progression of HEP Baseline:  Goal status: MET 05/20/2024  2.  Pt will be able to amb at least 400' with LRAD for increased home and limited community mobility Baseline:  Goal status:  MET 05/20/2024 (walked around 500')  3.  Pt will have improved 5x STS to </=20 sec to reduce her fall risk and increase functional LE strength Baseline:  Goal status: ONGOING 05/20/2024  4.  Pt will have improved R knee ext to within at least 10 deg of full extension to demo increased quad strength Baseline: 32 deg away from full ext Goal status: ONGOING (10d AROM, in sitting, 0 AAROM)  5.  Pt will have improved LEFS score to >/=42/80 to demo MCID Baseline: 33/80 05/20/2024: 53/80 Goal status: MET 05/20/2024  6. Pt will improve hip flexion to 110d AROM in seated   Baseline: 110d AAROM  Goal status INITIAL 05/20/2024  7.  Pt will independently ambulate 500' with LRAD within  6 minutes to demonstrate improved weightbearing tolerance, BLE strength, and functional capacity for community ambulation.  Baseline: - 300' FWW  Goal status INITIAL 05/20/2024   PLAN:  PT FREQUENCY: 2x/week  PT DURATION: 8 weeks  PLANNED INTERVENTIONS: 97164- PT Re-evaluation, 97750- Physical Performance Testing, 97110-Therapeutic exercises, 97530- Therapeutic activity, 97112- Neuromuscular re-education, 97535- Self Care, 02859- Manual therapy, [phone]- Gait training, [phone]- Aquatic Therapy, [phone]- Electrical stimulation (unattended), 97016- Vasopneumatic device, [id number]- Ultrasound, [id number]- Ionotophoresis 4mg /ml Dexamethasone , 79439 (1-2 muscles), 20561 (3+ muscles)- Dry Needling, Patient/Family education, Balance training, Stair training, Taping, Joint mobilization, Spinal mobilization, Vestibular training, Cryotherapy, and Moist heat  PLAN FOR NEXT SESSION: Continue with therapeutic focus on RLE strengthening, quadriceps recruitment,ambulation quality, and transfer quality.   Smith Mcnicholas April Ma L Doristine Shehan, PT, DPT 06/16/24 9:57 AM

## 2024-06-17 NOTE — Therapy (Signed)
 OUTPATIENT PHYSICAL THERAPY TREATMENT   Patient Name: Renee Watts MRN: [id number] DOB:[birth year], 73 y.o., female Today's Date: 06/18/2024  END OF SESSION:  PT End of Session - 06/18/24 0909     Visit Number 17    Number of Visits 20    Date for Recertification  07/21/24    Authorization Type [employer]    Progress Note Due on Visit 10    PT Start Time 0915    PT Stop Time 0953    PT Time Calculation (min) 38 min    Activity Tolerance Patient tolerated treatment well    Behavior During Therapy [facility] for tasks assessed/performed          Past Medical History:  Diagnosis Date   Allergy    Benign paroxysmal positional vertigo 06/25/2013   dx with prior PCP, s/p labs, mri and vestibular rehab; nasal spray for etd seemed to help   Blood in stool 03/11/2012   Dependent edema    Bilateral   Depression    GERD (gastroesophageal reflux disease)    on meds   Hyperlipidemia    on meds   Low back pain    sciatica; sees orthopedic specialist ([facility])for this and shoulder tendinitis   Obesity, diabetes, and hypertension syndrome (HCC)    on med   Osteoarthritis, knee    Bilateral, s/p 3 total knee replacement surgerues on right, last 2005. Dr. Duwayne   Pellegrini-Steida syndrome    Myositis ossificans   Seasonal allergies    Shingles 07/10/2012   LESIONS CLEARED BUT NOT TOLERATING ANY THING TIGHT AGAINST BODY--SHINGLES WERE AROSS BACK AND ABDOMEN   Past Surgical History:  Procedure Laterality Date   ABDOMINAL HYSTERECTOMY  1984   BLADDER SURGERY  03/2017   bladder tac per pt   COLONOSCOPY  2020   MS-MAC-goly (good)-hems/TAx5)   left shoulder steroid injection     ORIF FEMUR FRACTURE Right 12/06/2023   Procedure: OPEN REDUCTION INTERNAL FIXATION (ORIF) DISTAL FEMUR FRACTURE;  Surgeon: Kendal Franky SQUIBB, MD;  Location: [facility] OR;  Service: Orthopedics;  Laterality: Right;   POLYPECTOMY  2020   TA x 5   TONSILLECTOMY  1977   TOTAL HIP ARTHROPLASTY Right  10/10/2012   Procedure: R TOTAL HIP ARTHROPLASTY ANTERIOR APPROACH;  Surgeon: Lonni CINDERELLA Poli, MD;  Location: [facility];  Service: Orthopedics;  Laterality: Right;  Right Total Hip Arthroplasty, Anterior Approach (C-Arm)   TOTAL KNEE ARTHROPLASTY Right    with multiple revisions, 2003, 2005   TOTAL KNEE ARTHROPLASTY Left 01/09/2013   Procedure: LEFT TOTAL KNEE ARTHROPLASTY;  Surgeon: Lonni CINDERELLA Poli, MD;  Location: [facility];  Service: Orthopedics;  Laterality: Left;   TUBAL LIGATION  1978   WISDOM TOOTH EXTRACTION  2013,2014   Patient Active Problem List   Diagnosis Date Noted   Closed fracture of distal end of right femur, unspecified fracture morphology, initial encounter (HCC) 12/05/2023   Lumbar radiculopathy 09/17/2023   Osteopenia 02/15/2021   Venous stasis of both lower extremities 06/27/2020   Bilateral lower extremity edema 02/05/2020   Recurrent sinusitis 09/03/2018   Vaginal vault prolapse after hysterectomy 02/13/2017   Obesity 01/06/2016   Low back pain 01/06/2016   Normocytic anemia 03/12/2013   Diabetic peripheral neuropathy (HCC) 09/01/2012   Hyperlipemia 09/16/2006   Hypertension associated with diabetes (HCC) 09/16/2006   GERD 09/16/2006   Type II diabetes mellitus with neurological manifestations (HCC) 06/17/2006   Osteoarthritis 06/17/2006   DEPENDENT EDEMA, LEGS, BILATERAL 06/17/2006  PCP: Mercer Clotilda SAUNDERS, MD  REFERRING PROVIDER: Kendal Franky SQUIBB, MD  REFERRING DIAG: ORIF of right distal fracture performed on 12/06/23  THERAPY DIAG:  Muscle weakness (generalized)  Other abnormalities of gait and mobility  Pain in right hip  Pain in right leg  Rationale for Evaluation and Treatment: Rehabilitation  ONSET DATE: 12/06/23  SUBJECTIVE:   SUBJECTIVE STATEMENT:  Pt remains highly motivated. Has some soreness from previous session.  From eval: Pt states she has been doing home health PT since her 12/06/23 hip surgery. Was d/c-ed ~1 month  ago. Was able to walk 500 feet with PT. Pt reports pain is more in the inside of her medial thigh. No pain where the incision is laterally. Has been using quad cane intermittently in the house. Pt is fearful of another fall. Pt notes she gets fatigued with prolonged standing but not limited by pain.   PERTINENT HISTORY: L3/4 laminectomy and PSIF on 09/17/23; neuropathy in legs, bilat knee replacement, R hip replacement   PAIN:  Are you having pain? Yes: NPRS scale: 6 currently for thigh muscles; at worst 10/10 Pain location: R medial thigh Pain description: sometimes sharp or achy Aggravating factors: Rolling in bed, stepping wrong, getting up quickly Relieving factors: hydrocodone , ice/heat  PRECAUTIONS: Fall  RED FLAGS: None   WEIGHT BEARING RESTRICTIONS: WBAT with no ROM restrictions per 12/06/23 surgery note  FALLS:  Has patient fallen in last 6 months? Yes. Number of falls 1  -- 12/05/23 resulting in her femur fracture  LIVING ENVIRONMENT: Lives with: lives with their spouse; children and grandchildren are in and out Lives in: House/apartment Stairs: 1 step up to enter Has following equipment at home: Single point cane, Quad cane small base, Walker - 2 wheeled, Grab bars, and walk in shower  OCCUPATION: Retired - loves to [employer] and entertainment  PLOF: Independent  PATIENT GOALS: Return to cooking and normal activities without having to stop and rest  NEXT MD VISIT: 06/04/24 to see PCP  OBJECTIVE:  Note: Objective measures were completed at Evaluation unless otherwise noted.  DIAGNOSTIC FINDINGS: X-rays of the lumbar spine from 03/09/2024 were independently reviewed and interpreted, showing laminectomy defect at L3/4.  Spondylolisthesis at L3/4 that does not shift between flexion and extension views.  Posterior instrumentation at L3 and L4.  No lucency seen around the screws and none of them have backed out.  No fracture or dislocation seen.   PATIENT SURVEYS:  Lower  Extremity Functional Score: 33 / 80 = 41.3 %  05/20/2024: 53/80    SENSATION: Neuropathy in bilat LEs -- knee to ankle  EDEMA:  Will swell if legs are down  MUSCLE LENGTH: Hamstrings: See knee AROM below  POSTURE: flexed trunk , weight shift left, and knees flexed  PALPATION: TTP medial knee/adductors  LOWER EXTREMITY ROM:  Active ROM Right eval Left eval Right 05/01/24 Left 05/01/24  Hip flexion sitting 95 PROM WNL in sup At least 110 deg 95 supine 110 supine  Hip extension      Hip abduction      Hip adduction      Hip internal rotation      Hip external rotation      Knee flexion sitting 80 110 85 110  Knee extension sitting -32 PROM can get -10 in sup -10 -9 -6  Ankle dorsiflexion      Ankle plantarflexion      Ankle inversion      Ankle eversion       (  Blank rows = not tested)  LOWER EXTREMITY MMT:   MMT Right eval Left eval  Hip flexion 3- 3+  Hip extension    Hip abduction supine 3+ 3+  Hip adduction    Hip internal rotation    Hip external rotation    Knee flexion 4 4  Knee extension 3- 3+  Ankle dorsiflexion    Ankle plantarflexion    Ankle inversion    Ankle eversion     (Blank rows = not tested)  LOWER EXTREMITY SPECIAL TESTS:  Hip special tests: Belvie (FABER) test: positive  and Thomas test: positive   FUNCTIONAL TESTS:  5 times sit to stand: with UE support 49.86 sec 05/20/2024: 69m, UE support from mat, height parallel with arm chair : 300' w/ FWW  GAIT: Distance walked: Into clinic Assistive device utilized: Environmental consultant - 2 wheeled Level of assistance: Modified independence Comments: Diminished swing phase on R, bilat steppage gait, diminished heel/toe on R, flexed trunk and knees  [facility] Adult PT Treatment:                                                DATE: 06/17/24 Therapeutic Exercise: NuStep level 3 x 8' LEs only Sitting LAQ eccentrics R only 2x10 Therapeutic Activity: Sitting step over yoga block 2x8 Sitting lean forward  with hands on yoga block and pushing through heels to lift butt 1 above bed x10 Standing alternating toe taps to 4 step in FWW 3x30  [facility] Adult PT Treatment:                                                DATE: 06/11/2024  Therapeutic Exercise: NuStep 8' LEs only Sitting LAQ eccentrics R only 2x10 Therapeutic Activity: Sitting step over yoga block 2x8 Sitting lean forward with hands on yoga block and pushing through heels to lift butt 1 above bed x10 Standing hip hinge with hands hovering RW 2x10 Standing alternating TKE 2x10   [facility] Adult PT Treatment:                                                DATE: 06/09/2024  Therapeutic Activity: NuStep 8'  Supine marching against YTB 2x12, hold 2s SAQ 1x12 RLE, hold 3s, STS 1x8 Treatment limited d/t pt attending 10 minutes late    PATIENT EDUCATION:  Education details: Exam findings, POC, initial HEP Person educated: Patient Education method: Explanation, Demonstration, and Handouts Education comprehension: verbalized understanding, returned demonstration, and needs further education  HOME EXERCISE PROGRAM: Access Code: [email or web address] URL: [email or web address] Date: 04/23/2024 Prepared by: Gellen April Marie Nonato  Exercises - Seated Heel Slide  - 1 x daily - 7 x weekly - 2 sets - 10 reps - Seated Quad Set  - 1 x daily - 7 x weekly - 2 sets - 10 reps - 3 sec hold - Seated Hip Abduction with Resistance  - 2 x daily - 7 x weekly - 1 sets - 10 reps - 3 sec hold - Seated Hip Flexion Isometric  - 2 x daily - 7 x weekly - 1 sets -  10 reps - 3 sec hold - Seated Active Hip Flexion  - 2 x daily - 7 x weekly - 1 sets - 10 reps - Beginner Bridge  - 2 x daily - 7 x weekly - 1 sets - 10 reps  ASSESSMENT:  CLINICAL IMPRESSION: Patient presents to PT reporting no current pain, does have some muscle soreness from previous session. Session today continued to focus on LE strengthening and standing activity tolerance. She  continues to display weakness in the RLE and compensation to lift the LE with trunk involvement. Patient was able to tolerate all prescribed exercises with no adverse effects. Patient continues to benefit from skilled PT services and should be progressed as able to improve functional independence.    Progress note 05/20/2024: Pt attended physical therapy session for re-evaluation of R hip ORIF. Pt is progressing well and has met 6 goals, pt continues to work towards 2 other initial goals. Difficulties continue with RLE strength, and transfer quality necessary for a safe return to independent lifestyle inline with PLOF. 2 additional goals were added to further progress quality of AROM and ambulation quality necessary for return to higher level community participation. Pt required minimal cuing as well as no assistance for safe and appropriate performance of today's activities. Continue with therapeutic focus on RLE strengthening, quadriceps recruitment,ambulation quality, and transfer quality. Education was given to continue applying ADL education from previous sessions as well as performing HEP as prescribed with freedom to progress as tolerated using previous education on modification and exercise dosage. Pt has displayed and verbalized competence regarding this education.   Pt continues to require the intervention of skilled outpatient physical therapy to address the aforementioned deficits and progress towards a functional level in line with therapeutic goals.  OBJECTIVE IMPAIRMENTS: Abnormal gait, decreased activity tolerance, decreased balance, decreased coordination, decreased endurance, decreased mobility, difficulty walking, decreased ROM, decreased strength, hypomobility, increased fascial restrictions, impaired flexibility, improper body mechanics, postural dysfunction, and pain.   ACTIVITY LIMITATIONS: carrying, lifting, bending, sitting, standing, squatting, sleeping, stairs, transfers, bed  mobility, dressing, hygiene/grooming, and locomotion level  PARTICIPATION LIMITATIONS: meal prep, cleaning, driving, shopping, and community activity  PERSONAL FACTORS: Age, Fitness, Past/current experiences, and Time since onset of injury/illness/exacerbation are also affecting patient's functional outcome.   REHAB POTENTIAL: Good  CLINICAL DECISION MAKING: Evolving/moderate complexity  EVALUATION COMPLEXITY: Moderate   GOALS: Goals reviewed with patient? Yes  SHORT TERM GOALS: Target date: 05/13/2024  Pt will be ind with initial HEP Baseline: Goal status: MET Pt reports adherence 05/15/24  2.  Pt will have improved hip flexion to at least 110 deg AROM Baseline:  Goal status: MET (in supine, and seated AAROM)  3.  Pt will report improved medial thigh/knee pain by >/=25% Baseline:  Goal status: MET 05/15/24: Pt reports max pain at 6/10   LONG TERM GOALS: Target date: 07/21/2024   Pt will be ind with management and progression of HEP Baseline:  Goal status: MET 05/20/2024  2.  Pt will be able to amb at least 400' with LRAD for increased home and limited community mobility Baseline:  Goal status: MET 05/20/2024 (walked around 500')  3.  Pt will have improved 5x STS to </=20 sec to reduce her fall risk and increase functional LE strength Baseline:  Goal status: ONGOING 05/20/2024  4.  Pt will have improved R knee ext to within at least 10 deg of full extension to demo increased quad strength Baseline: 32 deg away from full ext Goal status: ONGOING (  10d AROM, in sitting, 0 AAROM)  5.  Pt will have improved LEFS score to >/=42/80 to demo MCID Baseline: 33/80 05/20/2024: 53/80 Goal status: MET 05/20/2024  6. Pt will improve hip flexion to 110d AROM in seated   Baseline: 110d AAROM  Goal status INITIAL 05/20/2024  7.  Pt will independently ambulate 500' with LRAD within 6 minutes to demonstrate improved weightbearing tolerance, BLE strength, and functional capacity for  community ambulation.  Baseline: - 300' FWW  Goal status INITIAL 05/20/2024   PLAN:  PT FREQUENCY: 2x/week  PT DURATION: 8 weeks  PLANNED INTERVENTIONS: 97164- PT Re-evaluation, 97750- Physical Performance Testing, 97110-Therapeutic exercises, 97530- Therapeutic activity, 97112- Neuromuscular re-education, 97535- Self Care, [id number]- Manual therapy, [phone]- Gait training, [phone]- Aquatic Therapy, [phone]- Electrical stimulation (unattended), 97016- Vasopneumatic device, [id number]- Ultrasound, [id number]- Ionotophoresis 4mg /ml Dexamethasone , 79439 (1-2 muscles), 20561 (3+ muscles)- Dry Needling, Patient/Family education, Balance training, Stair training, Taping, Joint mobilization, Spinal mobilization, Vestibular training, Cryotherapy, and Moist heat  PLAN FOR NEXT SESSION: Continue with therapeutic focus on RLE strengthening, quadriceps recruitment,ambulation quality, and transfer quality.   Corean Pouch, PTA 06/18/24 9:53 AM

## 2024-06-18 ENCOUNTER — Ambulatory Visit

## 2024-06-18 DIAGNOSIS — M6281 Muscle weakness (generalized): Secondary | ICD-10-CM

## 2024-06-18 DIAGNOSIS — M79604 Pain in right leg: Secondary | ICD-10-CM

## 2024-06-18 DIAGNOSIS — R2689 Other abnormalities of gait and mobility: Secondary | ICD-10-CM

## 2024-06-18 DIAGNOSIS — M25551 Pain in right hip: Secondary | ICD-10-CM

## 2024-06-22 DIAGNOSIS — H02532 Eyelid retraction right lower eyelid: Secondary | ICD-10-CM | POA: Diagnosis not present

## 2024-06-22 DIAGNOSIS — E119 Type 2 diabetes mellitus without complications: Secondary | ICD-10-CM | POA: Diagnosis not present

## 2024-06-22 DIAGNOSIS — H25813 Combined forms of age-related cataract, bilateral: Secondary | ICD-10-CM | POA: Diagnosis not present

## 2024-06-22 DIAGNOSIS — H02535 Eyelid retraction left lower eyelid: Secondary | ICD-10-CM | POA: Diagnosis not present

## 2024-06-22 NOTE — Therapy (Signed)
 OUTPATIENT PHYSICAL THERAPY TREATMENT   Patient Name: Renee Watts MRN: [id number] DOB:[birth year], 73 y.o., female Today's Date: 06/23/2024  END OF SESSION:  PT End of Session - 06/23/24 0947     Visit Number 18    Number of Visits 20    Date for Recertification  07/21/24    Authorization Type [employer]    Progress Note Due on Visit 20    PT Start Time 0934    PT Stop Time 1015    PT Time Calculation (min) 41 min    Activity Tolerance Patient tolerated treatment well    Behavior During Therapy [facility] for tasks assessed/performed           Past Medical History:  Diagnosis Date   Allergy    Benign paroxysmal positional vertigo 06/25/2013   dx with prior PCP, s/p labs, mri and vestibular rehab; nasal spray for etd seemed to help   Blood in stool 03/11/2012   Dependent edema    Bilateral   Depression    GERD (gastroesophageal reflux disease)    on meds   Hyperlipidemia    on meds   Low back pain    sciatica; sees orthopedic specialist ([facility])for this and shoulder tendinitis   Obesity, diabetes, and hypertension syndrome (HCC)    on med   Osteoarthritis, knee    Bilateral, s/p 3 total knee replacement surgerues on right, last 2005. Dr. Duwayne   Pellegrini-Steida syndrome    Myositis ossificans   Seasonal allergies    Shingles 07/10/2012   LESIONS CLEARED BUT NOT TOLERATING ANY THING TIGHT AGAINST BODY--SHINGLES WERE AROSS BACK AND ABDOMEN   Past Surgical History:  Procedure Laterality Date   ABDOMINAL HYSTERECTOMY  1984   BLADDER SURGERY  03/2017   bladder tac per pt   COLONOSCOPY  2020   MS-MAC-goly (good)-hems/TAx5)   left shoulder steroid injection     ORIF FEMUR FRACTURE Right 12/06/2023   Procedure: OPEN REDUCTION INTERNAL FIXATION (ORIF) DISTAL FEMUR FRACTURE;  Surgeon: Kendal Franky SQUIBB, MD;  Location: [facility] OR;  Service: Orthopedics;  Laterality: Right;   POLYPECTOMY  2020   TA x 5   TONSILLECTOMY  1977   TOTAL HIP ARTHROPLASTY Right  10/10/2012   Procedure: R TOTAL HIP ARTHROPLASTY ANTERIOR APPROACH;  Surgeon: Lonni CINDERELLA Poli, MD;  Location: [facility];  Service: Orthopedics;  Laterality: Right;  Right Total Hip Arthroplasty, Anterior Approach (C-Arm)   TOTAL KNEE ARTHROPLASTY Right    with multiple revisions, 2003, 2005   TOTAL KNEE ARTHROPLASTY Left 01/09/2013   Procedure: LEFT TOTAL KNEE ARTHROPLASTY;  Surgeon: Lonni CINDERELLA Poli, MD;  Location: [facility];  Service: Orthopedics;  Laterality: Left;   TUBAL LIGATION  1978   WISDOM TOOTH EXTRACTION  2013,2014   Patient Active Problem List   Diagnosis Date Noted   Closed fracture of distal end of right femur, unspecified fracture morphology, initial encounter (HCC) 12/05/2023   Lumbar radiculopathy 09/17/2023   Osteopenia 02/15/2021   Venous stasis of both lower extremities 06/27/2020   Bilateral lower extremity edema 02/05/2020   Recurrent sinusitis 09/03/2018   Vaginal vault prolapse after hysterectomy 02/13/2017   Obesity 01/06/2016   Low back pain 01/06/2016   Normocytic anemia 03/12/2013   Diabetic peripheral neuropathy (HCC) 09/01/2012   Hyperlipemia 09/16/2006   Hypertension associated with diabetes (HCC) 09/16/2006   GERD 09/16/2006   Type II diabetes mellitus with neurological manifestations (HCC) 06/17/2006   Osteoarthritis 06/17/2006   DEPENDENT EDEMA, LEGS, BILATERAL 06/17/2006  PCP: Mercer Clotilda SAUNDERS, MD  REFERRING PROVIDER: Kendal Franky SQUIBB, MD  REFERRING DIAG: ORIF of right distal fracture performed on 12/06/23  THERAPY DIAG:  Muscle weakness (generalized)  Other abnormalities of gait and mobility  Pain in right hip  Rationale for Evaluation and Treatment: Rehabilitation  ONSET DATE: 12/06/23  SUBJECTIVE:   SUBJECTIVE STATEMENT: Pt reports being consistent with her exs over the weekend.  From eval: Pt states she has been doing home health PT since her 12/06/23 hip surgery. Was d/c-ed ~1 month ago. Was able to walk 500 feet with  PT. Pt reports pain is more in the inside of her medial thigh. No pain where the incision is laterally. Has been using quad cane intermittently in the house. Pt is fearful of another fall. Pt notes she gets fatigued with prolonged standing but not limited by pain.   PERTINENT HISTORY: L3/4 laminectomy and PSIF on 09/17/23; neuropathy in legs, bilat knee replacement, R hip replacement   PAIN:  Are you having pain? Yes: NPRS scale: 6 currently for thigh muscles; at worst 10/10 Pain location: R medial thigh Pain description: sometimes sharp or achy Aggravating factors: Rolling in bed, stepping wrong, getting up quickly Relieving factors: hydrocodone , ice/heat  PRECAUTIONS: Fall  RED FLAGS: None   WEIGHT BEARING RESTRICTIONS: WBAT with no ROM restrictions per 12/06/23 surgery note  FALLS:  Has patient fallen in last 6 months? Yes. Number of falls 1  -- 12/05/23 resulting in her femur fracture  LIVING ENVIRONMENT: Lives with: lives with their spouse; children and grandchildren are in and out Lives in: House/apartment Stairs: 1 step up to enter Has following equipment at home: Single point cane, Quad cane small base, Walker - 2 wheeled, Grab bars, and walk in shower  OCCUPATION: Retired - loves to the [employer] and entertainment  PLOF: Independent  PATIENT GOALS: Return to cooking and normal activities without having to stop and rest  NEXT MD VISIT: 06/04/24 to see PCP  OBJECTIVE:  Note: Objective measures were completed at Evaluation unless otherwise noted.  DIAGNOSTIC FINDINGS: X-rays of the lumbar spine from 03/09/2024 were independently reviewed and interpreted, showing laminectomy defect at L3/4.  Spondylolisthesis at L3/4 that does not shift between flexion and extension views.  Posterior instrumentation at L3 and L4.  No lucency seen around the screws and none of them have backed out.  No fracture or dislocation seen.   PATIENT SURVEYS:  Lower Extremity Functional Score: 33 / 80 = 41.3 %   05/20/2024: 53/80    SENSATION: Neuropathy in bilat LEs -- knee to ankle  EDEMA:  Will swell if legs are down  MUSCLE LENGTH: Hamstrings: See knee AROM below  POSTURE: flexed trunk , weight shift left, and knees flexed  PALPATION: TTP medial knee/adductors  LOWER EXTREMITY ROM:  Active ROM Right eval Left eval Right 05/01/24 Left 05/01/24  Hip flexion sitting 95 PROM WNL in sup At least 110 deg 95 supine 110 supine  Hip extension      Hip abduction      Hip adduction      Hip internal rotation      Hip external rotation      Knee flexion sitting 80 110 85 110  Knee extension sitting -32 PROM can get -10 in sup -10 -9 -6  Ankle dorsiflexion      Ankle plantarflexion      Ankle inversion      Ankle eversion       (Blank rows = not tested)  LOWER EXTREMITY MMT:   MMT Right eval Left eval  Hip flexion 3- 3+  Hip extension    Hip abduction supine 3+ 3+  Hip adduction    Hip internal rotation    Hip external rotation    Knee flexion 4 4  Knee extension 3- 3+  Ankle dorsiflexion    Ankle plantarflexion    Ankle inversion    Ankle eversion     (Blank rows = not tested)  LOWER EXTREMITY SPECIAL TESTS:  Hip special tests: Belvie (FABER) test: positive  and Thomas test: positive   FUNCTIONAL TESTS:  5 times sit to stand: with UE support 49.86 sec 05/20/2024: 56m, UE support from mat, height parallel with arm chair : 300' w/ FWW  GAIT: Distance walked: Into clinic Assistive device utilized: Environmental Consultant - 2 wheeled Level of assistance: Modified independence Comments: Diminished swing phase on R, bilat steppage gait, diminished heel/toe on R, flexed trunk and knees  [facility] Adult PT Treatment:                                                DATE: 06/23/24 Therapeutic Exercise: Sitting LAQ eccentrics R only 2x10, strap assist to complete full concentric ext and eccentric lowering as needed SLR c quad set 2x8, strap for concentric assist and eccentric lowering as  needed Therapeutic Activity: Standing TKE 2x12 RTB STS c hand assist x10, pt favored the R leg  [facility] Adult PT Treatment:                                                DATE: 06/17/24 Therapeutic Exercise: NuStep level 3 x 8' LEs only Sitting LAQ eccentrics R only 2x10 Therapeutic Activity: Sitting step over yoga block 2x8 Sitting lean forward with hands on yoga block and pushing through heels to lift butt 1 above bed x10 Standing alternating toe taps to 4 step in FWW 3x30  PATIENT EDUCATION:  Education details: Exam findings, POC, initial HEP Person educated: Patient Education method: Explanation, Demonstration, and Handouts Education comprehension: verbalized understanding, returned demonstration, and needs further education  HOME EXERCISE PROGRAM: Access Code: [email or web address] URL: [email or web address] Date: 04/23/2024 Prepared by: Gellen April Marie Nonato  Exercises - Seated Heel Slide  - 1 x daily - 7 x weekly - 2 sets - 10 reps - Seated Quad Set  - 1 x daily - 7 x weekly - 2 sets - 10 reps - 3 sec hold - Seated Hip Abduction with Resistance  - 2 x daily - 7 x weekly - 1 sets - 10 reps - 3 sec hold - Seated Hip Flexion Isometric  - 2 x daily - 7 x weekly - 1 sets - 10 reps - 3 sec hold - Seated Active Hip Flexion  - 2 x daily - 7 x weekly - 1 sets - 10 reps - Beginner Bridge  - 2 x daily - 7 x weekly - 1 sets - 10 reps  ASSESSMENT:  CLINICAL IMPRESSION: Pt does not demonstrate bilat active DF past neutral. PT focused on R LE strengthening for  the pt which needs strap assist for concentric knee ext and a SLR and to less of a degree with eccentric lowering. Pt is to use  strap at home to assist with strengthening through the full AROM. Pt tolerated prescribed exs in PT today without adverse effects.  Progress note 05/20/2024: Pt attended physical therapy session for re-evaluation of R hip ORIF. Pt is progressing well and has met 6 goals, pt continues to work towards  2 other initial goals. Difficulties continue with RLE strength, and transfer quality necessary for a safe return to independent lifestyle inline with PLOF. 2 additional goals were added to further progress quality of AROM and ambulation quality necessary for return to higher level community participation. Pt required minimal cuing as well as no assistance for safe and appropriate performance of today's activities. Continue with therapeutic focus on RLE strengthening, quadriceps recruitment,ambulation quality, and transfer quality. Education was given to continue applying ADL education from previous sessions as well as performing HEP as prescribed with freedom to progress as tolerated using previous education on modification and exercise dosage. Pt has displayed and verbalized competence regarding this education.   Pt continues to require the intervention of skilled outpatient physical therapy to address the aforementioned deficits and progress towards a functional level in line with therapeutic goals.  OBJECTIVE IMPAIRMENTS: Abnormal gait, decreased activity tolerance, decreased balance, decreased coordination, decreased endurance, decreased mobility, difficulty walking, decreased ROM, decreased strength, hypomobility, increased fascial restrictions, impaired flexibility, improper body mechanics, postural dysfunction, and pain.   ACTIVITY LIMITATIONS: carrying, lifting, bending, sitting, standing, squatting, sleeping, stairs, transfers, bed mobility, dressing, hygiene/grooming, and locomotion level  PARTICIPATION LIMITATIONS: meal prep, cleaning, driving, shopping, and community activity  PERSONAL FACTORS: Age, Fitness, Past/current experiences, and Time since onset of injury/illness/exacerbation are also affecting patient's functional outcome.   REHAB POTENTIAL: Good  CLINICAL DECISION MAKING: Evolving/moderate complexity  EVALUATION COMPLEXITY: Moderate   GOALS: Goals reviewed with patient?  Yes  SHORT TERM GOALS: Target date: 05/13/2024  Pt will be ind with initial HEP Baseline: Goal status: MET Pt reports adherence 05/15/24  2.  Pt will have improved hip flexion to at least 110 deg AROM Baseline:  Goal status: MET (in supine, and seated AAROM)  3.  Pt will report improved medial thigh/knee pain by >/=25% Baseline:  Goal status: MET 05/15/24: Pt reports max pain at 6/10   LONG TERM GOALS: Target date: 07/21/2024   Pt will be ind with management and progression of HEP Baseline:  Goal status: MET 05/20/2024  2.  Pt will be able to amb at least 400' with LRAD for increased home and limited community mobility Baseline:  Goal status: MET 05/20/2024 (walked around 500')  3.  Pt will have improved 5x STS to </=20 sec to reduce her fall risk and increase functional LE strength Baseline:  Goal status: ONGOING 05/20/2024  4.  Pt will have improved R knee ext to within at least 10 deg of full extension to demo increased quad strength Baseline: 32 deg away from full ext Goal status: ONGOING (10d AROM, in sitting, 0 AAROM)  5.  Pt will have improved LEFS score to >/=42/80 to demo MCID Baseline: 33/80 05/20/2024: 53/80 Goal status: MET 05/20/2024  6. Pt will improve hip flexion to 110d AROM in seated   Baseline: 110d AAROM  Goal status INITIAL 05/20/2024  7.  Pt will independently ambulate 500' with LRAD within 6 minutes to demonstrate improved weightbearing tolerance, BLE strength, and functional capacity for community ambulation.  Baseline: - 300' FWW  Goal status INITIAL 05/20/2024   PLAN:  PT FREQUENCY: 2x/week  PT DURATION: 8 weeks  PLANNED INTERVENTIONS: 02835- PT Re-evaluation,  02249- Physical Performance Testing, 97110-Therapeutic exercises, 97530- Therapeutic activity, [id number]- Neuromuscular re-education, [phone]- Self Care, 02859- Manual therapy, [phone]- Gait training, [phone]- Aquatic Therapy, [phone]- Electrical stimulation (unattended), 97016- Vasopneumatic  device, [id number]- Ultrasound, [id number]- Ionotophoresis 4mg /ml Dexamethasone , 79439 (1-2 muscles), 20561 (3+ muscles)- Dry Needling, Patient/Family education, Balance training, Stair training, Taping, Joint mobilization, Spinal mobilization, Vestibular training, Cryotherapy, and Moist heat  PLAN FOR NEXT SESSION: Continue with therapeutic focus on RLE strengthening, quadriceps recruitment,ambulation quality, and transfer quality.   Ethelda Deangelo, PT 06/23/24 10:16 AM

## 2024-06-23 ENCOUNTER — Ambulatory Visit

## 2024-06-23 DIAGNOSIS — R2689 Other abnormalities of gait and mobility: Secondary | ICD-10-CM

## 2024-06-23 DIAGNOSIS — M25551 Pain in right hip: Secondary | ICD-10-CM

## 2024-06-23 DIAGNOSIS — M79604 Pain in right leg: Secondary | ICD-10-CM | POA: Diagnosis not present

## 2024-06-23 DIAGNOSIS — M6281 Muscle weakness (generalized): Secondary | ICD-10-CM

## 2024-06-24 NOTE — Therapy (Signed)
 OUTPATIENT PHYSICAL THERAPY TREATMENT   Patient Name: Renee Watts MRN: [id number] DOB:[birth year], 73 y.o., female Today's Date: 06/25/2024  END OF SESSION:  PT End of Session - 06/25/24 0943     Visit Number 19    Number of Visits 20    Date for Recertification  07/21/24    Authorization Type [employer]    Progress Note Due on Visit 20    PT Start Time 0935    PT Stop Time 1015    PT Time Calculation (min) 40 min    Activity Tolerance Patient tolerated treatment well    Behavior During Therapy [facility] for tasks assessed/performed            Past Medical History:  Diagnosis Date   Allergy    Benign paroxysmal positional vertigo 06/25/2013   dx with prior PCP, s/p labs, mri and vestibular rehab; nasal spray for etd seemed to help   Blood in stool 03/11/2012   Dependent edema    Bilateral   Depression    GERD (gastroesophageal reflux disease)    on meds   Hyperlipidemia    on meds   Low back pain    sciatica; sees orthopedic specialist ([facility])for this and shoulder tendinitis   Obesity, diabetes, and hypertension syndrome (HCC)    on med   Osteoarthritis, knee    Bilateral, s/p 3 total knee replacement surgerues on right, last 2005. Dr. Duwayne   Pellegrini-Steida syndrome    Myositis ossificans   Seasonal allergies    Shingles 07/10/2012   LESIONS CLEARED BUT NOT TOLERATING ANY THING TIGHT AGAINST BODY--SHINGLES WERE AROSS BACK AND ABDOMEN   Past Surgical History:  Procedure Laterality Date   ABDOMINAL HYSTERECTOMY  1984   BLADDER SURGERY  03/2017   bladder tac per pt   COLONOSCOPY  2020   MS-MAC-goly (good)-hems/TAx5)   left shoulder steroid injection     ORIF FEMUR FRACTURE Right 12/06/2023   Procedure: OPEN REDUCTION INTERNAL FIXATION (ORIF) DISTAL FEMUR FRACTURE;  Surgeon: Kendal Franky SQUIBB, MD;  Location: [facility] OR;  Service: Orthopedics;  Laterality: Right;   POLYPECTOMY  2020   TA x 5   TONSILLECTOMY  1977   TOTAL HIP ARTHROPLASTY  Right 10/10/2012   Procedure: R TOTAL HIP ARTHROPLASTY ANTERIOR APPROACH;  Surgeon: Lonni CINDERELLA Poli, MD;  Location: [facility] ORS;  Service: Orthopedics;  Laterality: Right;  Right Total Hip Arthroplasty, Anterior Approach (C-Arm)   TOTAL KNEE ARTHROPLASTY Right    with multiple revisions, 2003, 2005   TOTAL KNEE ARTHROPLASTY Left 01/09/2013   Procedure: LEFT TOTAL KNEE ARTHROPLASTY;  Surgeon: Lonni CINDERELLA Poli, MD;  Location: [facility];  Service: Orthopedics;  Laterality: Left;   TUBAL LIGATION  1978   WISDOM TOOTH EXTRACTION  2013,2014   Patient Active Problem List   Diagnosis Date Noted   Closed fracture of distal end of right femur, unspecified fracture morphology, initial encounter (HCC) 12/05/2023   Lumbar radiculopathy 09/17/2023   Osteopenia 02/15/2021   Venous stasis of both lower extremities 06/27/2020   Bilateral lower extremity edema 02/05/2020   Recurrent sinusitis 09/03/2018   Vaginal vault prolapse after hysterectomy 02/13/2017   Obesity 01/06/2016   Low back pain 01/06/2016   Normocytic anemia 03/12/2013   Diabetic peripheral neuropathy (HCC) 09/01/2012   Hyperlipemia 09/16/2006   Hypertension associated with diabetes (HCC) 09/16/2006   GERD 09/16/2006   Type II diabetes mellitus with neurological manifestations (HCC) 06/17/2006   Osteoarthritis 06/17/2006   DEPENDENT EDEMA, LEGS, BILATERAL 06/17/2006  PCP: Mercer Clotilda SAUNDERS, MD  REFERRING PROVIDER: Kendal Franky SQUIBB, MD  REFERRING DIAG: ORIF of right distal fracture performed on 12/06/23  THERAPY DIAG:  Pain in right leg  Muscle weakness (generalized)  Other abnormalities of gait and mobility  Rationale for Evaluation and Treatment: Rehabilitation  ONSET DATE: 12/06/23  SUBJECTIVE:   SUBJECTIVE STATEMENT: Pt reports her R leg weakness may be related the multiple surgeries she had with a THA and several revisions for TKA's.  From eval: Pt states she has been doing home health PT since her 12/06/23 hip  surgery. Was d/c-ed ~1 month ago. Was able to walk 500 feet with PT. Pt reports pain is more in the inside of her medial thigh. No pain where the incision is laterally. Has been using quad cane intermittently in the house. Pt is fearful of another fall. Pt notes she gets fatigued with prolonged standing but not limited by pain.   PERTINENT HISTORY: L3/4 laminectomy and PSIF on 09/17/23; neuropathy in legs, bilat knee replacement, R hip replacement   PAIN:  Are you having pain? Yes: NPRS scale: 6 currently for thigh muscles; at worst 10/10 Pain location: R medial thigh Pain description: sometimes sharp or achy Aggravating factors: Rolling in bed, stepping wrong, getting up quickly Relieving factors: hydrocodone , ice/heat  PRECAUTIONS: Fall  RED FLAGS: None   WEIGHT BEARING RESTRICTIONS: WBAT with no ROM restrictions per 12/06/23 surgery note  FALLS:  Has patient fallen in last 6 months? Yes. Number of falls 1  -- 12/05/23 resulting in her femur fracture  LIVING ENVIRONMENT: Lives with: lives with their spouse; children and grandchildren are in and out Lives in: House/apartment Stairs: 1 step up to enter Has following equipment at home: Single point cane, Quad cane small base, Walker - 2 wheeled, Grab bars, and walk in shower  OCCUPATION: Retired - loves to the [employer] and entertainment  PLOF: Independent  PATIENT GOALS: Return to cooking and normal activities without having to stop and rest  NEXT MD VISIT: 06/04/24 to see PCP  OBJECTIVE:  Note: Objective measures were completed at Evaluation unless otherwise noted.  DIAGNOSTIC FINDINGS: X-rays of the lumbar spine from 03/09/2024 were independently reviewed and interpreted, showing laminectomy defect at L3/4.  Spondylolisthesis at L3/4 that does not shift between flexion and extension views.  Posterior instrumentation at L3 and L4.  No lucency seen around the screws and none of them have backed out.  No fracture or dislocation seen.    PATIENT SURVEYS:  Lower Extremity Functional Score: 33 / 80 = 41.3 %  05/20/2024: 53/80    SENSATION: Neuropathy in bilat LEs -- knee to ankle  EDEMA:  Will swell if legs are down  MUSCLE LENGTH: Hamstrings: See knee AROM below  POSTURE: flexed trunk , weight shift left, and knees flexed  PALPATION: TTP medial knee/adductors  LOWER EXTREMITY ROM:  Active ROM Right eval Left eval Right 05/01/24 Left 05/01/24  Hip flexion sitting 95 PROM WNL in sup At least 110 deg 95 supine 110 supine  Hip extension      Hip abduction      Hip adduction      Hip internal rotation      Hip external rotation      Knee flexion sitting 80 110 85 110  Knee extension sitting -32 PROM can get -10 in sup -10 -9 -6  Ankle dorsiflexion      Ankle plantarflexion      Ankle inversion      Ankle eversion       (  Blank rows = not tested)  LOWER EXTREMITY MMT:   MMT Right eval Left eval  Hip flexion 3- 3+  Hip extension    Hip abduction supine 3+ 3+  Hip adduction    Hip internal rotation    Hip external rotation    Knee flexion 4 4  Knee extension 3- 3+  Ankle dorsiflexion    Ankle plantarflexion    Ankle inversion    Ankle eversion     (Blank rows = not tested)  LOWER EXTREMITY SPECIAL TESTS:  Hip special tests: Belvie (FABER) test: positive  and Thomas test: positive   FUNCTIONAL TESTS:  5 times sit to stand: with UE support 49.86 sec 05/20/2024: 10m, UE support from mat, height parallel with arm chair : 300' w/ FWW  GAIT: Distance walked: Into clinic Assistive device utilized: Environmental Consultant - 2 wheeled Level of assistance: Modified independence Comments: Diminished swing phase on R, bilat steppage gait, diminished heel/toe on R, flexed trunk and knees  [facility] Adult PT Treatment:                                                DATE: 06/25/24 Therapeutic Exercise: Sitting LAQ eccentrics R only x15, strap assist to complete full concentric ext and eccentric lowering as  needed Heel slide x15 c use of strap at EOM for stretch SLR c quad set x15, strap for concentric assist and eccentric lowering as needed Bridge x15 partial H/L R LE clam BluTB Therapeutic Activity: Nustep x5 mins L5 UE/LE Standing knee lifts x10 each Standing TKE 2x12 RTB STS c hand assist x10, pt favored the R leg  [facility] Adult PT Treatment:                                                DATE: 06/23/24 Therapeutic Exercise: Sitting LAQ eccentrics R only 2x10, strap assist to complete full concentric ext and eccentric lowering as needed SLR c quad set 2x8, strap for concentric assist and eccentric lowering as needed Therapeutic Activity: Standing TKE 2x12 RTB STS c hand assist x10, pt favored the R leg  PATIENT EDUCATION:  Education details: Exam findings, POC, initial HEP Person educated: Patient Education method: Explanation, Demonstration, and Handouts Education comprehension: verbalized understanding, returned demonstration, and needs further education  HOME EXERCISE PROGRAM: Access Code: [email or web address] URL: [email or web address] Date: 04/23/2024 Prepared by: Gellen April Marie Nonato  Exercises - Seated Heel Slide  - 1 x daily - 7 x weekly - 2 sets - 10 reps - Seated Quad Set  - 1 x daily - 7 x weekly - 2 sets - 10 reps - 3 sec hold - Seated Hip Abduction with Resistance  - 2 x daily - 7 x weekly - 1 sets - 10 reps - 3 sec hold - Seated Hip Flexion Isometric  - 2 x daily - 7 x weekly - 1 sets - 10 reps - 3 sec hold - Seated Active Hip Flexion  - 2 x daily - 7 x weekly - 1 sets - 10 reps - Beginner Bridge  - 2 x daily - 7 x weekly - 1 sets - 10 reps  ASSESSMENT:  CLINICAL IMPRESSION: PT was completed for R LE strengthening both in the  open and closed kinetic chains. R hip flexor and quad strength continues to be markedly decreased. Will reassess LTGS her next appt for continuation of PT vs DC. Anticipate continuation with pt's fall risk.  Progress note 05/20/2024:  Pt attended physical therapy session for re-evaluation of R hip ORIF. Pt is progressing well and has met 6 goals, pt continues to work towards 2 other initial goals. Difficulties continue with RLE strength, and transfer quality necessary for a safe return to independent lifestyle inline with PLOF. 2 additional goals were added to further progress quality of AROM and ambulation quality necessary for return to higher level community participation. Pt required minimal cuing as well as no assistance for safe and appropriate performance of today's activities. Continue with therapeutic focus on RLE strengthening, quadriceps recruitment,ambulation quality, and transfer quality. Education was given to continue applying ADL education from previous sessions as well as performing HEP as prescribed with freedom to progress as tolerated using previous education on modification and exercise dosage. Pt has displayed and verbalized competence regarding this education.   Pt continues to require the intervention of skilled outpatient physical therapy to address the aforementioned deficits and progress towards a functional level in line with therapeutic goals.  OBJECTIVE IMPAIRMENTS: Abnormal gait, decreased activity tolerance, decreased balance, decreased coordination, decreased endurance, decreased mobility, difficulty walking, decreased ROM, decreased strength, hypomobility, increased fascial restrictions, impaired flexibility, improper body mechanics, postural dysfunction, and pain.   ACTIVITY LIMITATIONS: carrying, lifting, bending, sitting, standing, squatting, sleeping, stairs, transfers, bed mobility, dressing, hygiene/grooming, and locomotion level  PARTICIPATION LIMITATIONS: meal prep, cleaning, driving, shopping, and community activity  PERSONAL FACTORS: Age, Fitness, Past/current experiences, and Time since onset of injury/illness/exacerbation are also affecting patient's functional outcome.   REHAB POTENTIAL:  Good  CLINICAL DECISION MAKING: Evolving/moderate complexity  EVALUATION COMPLEXITY: Moderate   GOALS: Goals reviewed with patient? Yes  SHORT TERM GOALS: Target date: 05/13/2024  Pt will be ind with initial HEP Baseline: Goal status: MET Pt reports adherence 05/15/24  2.  Pt will have improved hip flexion to at least 110 deg AROM Baseline:  Goal status: MET (in supine, and seated AAROM)  3.  Pt will report improved medial thigh/knee pain by >/=25% Baseline:  Goal status: MET 05/15/24: Pt reports max pain at 6/10   LONG TERM GOALS: Target date: 07/21/2024   Pt will be ind with management and progression of HEP Baseline:  Goal status: MET 05/20/2024  2.  Pt will be able to amb at least 400' with LRAD for increased home and limited community mobility Baseline:  Goal status: MET 05/20/2024 (walked around 500')  3.  Pt will have improved 5x STS to </=20 sec to reduce her fall risk and increase functional LE strength Baseline:  Goal status: ONGOING 05/20/2024  4.  Pt will have improved R knee ext to within at least 10 deg of full extension to demo increased quad strength Baseline: 32 deg away from full ext Goal status: ONGOING (10d AROM, in sitting, 0 AAROM)  5.  Pt will have improved LEFS score to >/=42/80 to demo MCID Baseline: 33/80 05/20/2024: 53/80 Goal status: MET 05/20/2024  6. Pt will improve hip flexion to 110d AROM in seated   Baseline: 110d AAROM  Goal status INITIAL 05/20/2024  7.  Pt will independently ambulate 500' with LRAD within 6 minutes to demonstrate improved weightbearing tolerance, BLE strength, and functional capacity for community ambulation.  Baseline: - 300' FWW  Goal status INITIAL 05/20/2024   PLAN:  PT  FREQUENCY: 2x/week  PT DURATION: 8 weeks  PLANNED INTERVENTIONS: 97164- PT Re-evaluation, 97750- Physical Performance Testing, 97110-Therapeutic exercises, 97530- Therapeutic activity, [id number]- Neuromuscular re-education, 97535- Self  Care, [id number]- Manual therapy, [phone]- Gait training, [phone]- Aquatic Therapy, [phone]- Electrical stimulation (unattended), 97016- Vasopneumatic device, [id number]- Ultrasound, [id number]- Ionotophoresis 4mg /ml Dexamethasone , 79439 (1-2 muscles), [id number] (3+ muscles)- Dry Needling, Patient/Family education, Balance training, Stair training, Taping, Joint mobilization, Spinal mobilization, Vestibular training, Cryotherapy, and Moist heat  PLAN FOR NEXT SESSION: Continue with therapeutic focus on RLE strengthening, quadriceps recruitment,ambulation quality, and transfer quality.   Terrisha Lopata MS, PT 06/25/24 5:58 PM

## 2024-06-25 ENCOUNTER — Ambulatory Visit

## 2024-06-25 DIAGNOSIS — M6281 Muscle weakness (generalized): Secondary | ICD-10-CM

## 2024-06-25 DIAGNOSIS — M79604 Pain in right leg: Secondary | ICD-10-CM | POA: Diagnosis not present

## 2024-06-25 DIAGNOSIS — R2689 Other abnormalities of gait and mobility: Secondary | ICD-10-CM

## 2024-06-26 ENCOUNTER — Other Ambulatory Visit: Payer: Self-pay | Admitting: Family Medicine

## 2024-06-26 DIAGNOSIS — J302 Other seasonal allergic rhinitis: Secondary | ICD-10-CM

## 2024-06-29 ENCOUNTER — Encounter: Payer: Self-pay | Admitting: Radiology

## 2024-07-06 NOTE — Therapy (Incomplete)
 OUTPATIENT PHYSICAL THERAPY TREATMENT   Patient Name: Renee Watts MRN: [id number] DOB:04-11-1951, 73 y.o., female Today's Date: 07/06/2024  END OF SESSION:      Past Medical History:  Diagnosis Date   Allergy    Benign paroxysmal positional vertigo 06/25/2013   dx with prior PCP, s/p labs, mri and vestibular rehab; nasal spray for etd seemed to help   Blood in stool 03/11/2012   Dependent edema    Bilateral   Depression    GERD (gastroesophageal reflux disease)    on meds   Hyperlipidemia    on meds   Low back pain    sciatica; sees orthopedic specialist ([facility])for this and shoulder tendinitis   Obesity, diabetes, and hypertension syndrome (HCC)    on med   Osteoarthritis, knee    Bilateral, s/p 3 total knee replacement surgerues on right, last 2005. Dr. Duwayne   Pellegrini-Steida syndrome    Myositis ossificans   Seasonal allergies    Shingles 07/10/2012   LESIONS CLEARED BUT NOT TOLERATING ANY THING TIGHT AGAINST BODY--SHINGLES WERE AROSS BACK AND ABDOMEN   Past Surgical History:  Procedure Laterality Date   ABDOMINAL HYSTERECTOMY  1984   BLADDER SURGERY  03/2017   bladder tac per pt   COLONOSCOPY  2020   MS-MAC-goly (good)-hems/TAx5)   left shoulder steroid injection     ORIF FEMUR FRACTURE Right 12/06/2023   Procedure: OPEN REDUCTION INTERNAL FIXATION (ORIF) DISTAL FEMUR FRACTURE;  Surgeon: Kendal Franky SQUIBB, MD;  Location: [facility];  Service: Orthopedics;  Laterality: Right;   POLYPECTOMY  2020   TA x 5   TONSILLECTOMY  1977   TOTAL HIP ARTHROPLASTY Right 10/10/2012   Procedure: R TOTAL HIP ARTHROPLASTY ANTERIOR APPROACH;  Surgeon: Lonni CINDERELLA Poli, MD;  Location: [facility];  Service: Orthopedics;  Laterality: Right;  Right Total Hip Arthroplasty, Anterior Approach (C-Arm)   TOTAL KNEE ARTHROPLASTY Right    with multiple revisions, 2003, 2005   TOTAL KNEE ARTHROPLASTY Left 01/09/2013   Procedure: LEFT TOTAL KNEE ARTHROPLASTY;  Surgeon:  Lonni CINDERELLA Poli, MD;  Location: [facility];  Service: Orthopedics;  Laterality: Left;   TUBAL LIGATION  1978   WISDOM TOOTH EXTRACTION  2013,2014   Patient Active Problem List   Diagnosis Date Noted   Closed fracture of distal end of right femur, unspecified fracture morphology, initial encounter (HCC) 12/05/2023   Lumbar radiculopathy 09/17/2023   Osteopenia 02/15/2021   Venous stasis of both lower extremities 06/27/2020   Bilateral lower extremity edema 02/05/2020   Recurrent sinusitis 09/03/2018   Vaginal vault 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  Rationale for Evaluation and Treatment: Rehabilitation  ONSET DATE: 12/06/23  SUBJECTIVE:   SUBJECTIVE STATEMENT: Pt reports her R leg weakness may be related the multiple surgeries she had with a THA and several revisions for TKA's.  From eval: Pt states she has been doing home health PT since her 12/06/23 hip surgery. Was d/c-ed ~1 month ago. Was  able to walk 500 feet with PT. Pt reports pain is more in the inside of her medial thigh. No pain where the incision is laterally. Has been using quad cane intermittently in the house. Pt is fearful of another fall. Pt notes she gets fatigued with prolonged standing but not limited by pain.   PERTINENT HISTORY: L3/4 laminectomy and PSIF on 09/17/23; neuropathy in legs, bilat knee replacement, R hip replacement   PAIN:  Are you  having pain? Yes: NPRS scale: 6 currently for thigh muscles; at worst 10/10 Pain location: R medial thigh Pain description: sometimes sharp or achy Aggravating factors: Rolling in bed, stepping wrong, getting up quickly Relieving factors: hydrocodone , ice/heat  PRECAUTIONS: Fall  RED FLAGS: None   WEIGHT BEARING RESTRICTIONS: WBAT with no ROM restrictions per 12/06/23 surgery note  FALLS:  Has patient fallen in last 6 months? Yes. Number of falls 1  -- 12/05/23 resulting in her femur fracture  LIVING ENVIRONMENT: Lives with: lives with their spouse; children and grandchildren are in and out Lives in: House/apartment Stairs: 1 step up to enter Has following equipment at home: Single point cane, Quad cane small base, Walker - 2 wheeled, Grab bars, and walk in shower  OCCUPATION: Retired - loves to the [employer] and entertainment  PLOF: Independent  PATIENT GOALS: Return to cooking and normal activities without having to stop and rest  NEXT MD VISIT: 06/04/24 to see PCP  OBJECTIVE:  Note: Objective measures were completed at Evaluation unless otherwise noted.  DIAGNOSTIC FINDINGS: X-rays of the lumbar spine from 03/09/2024 were independently reviewed and interpreted, showing laminectomy defect at L3/4.  Spondylolisthesis at L3/4 that does not shift between flexion and extension views.  Posterior instrumentation at L3 and L4.  No lucency seen around the screws and none of them have backed out.  No fracture or dislocation seen.   PATIENT SURVEYS:  Lower Extremity Functional Score: 33 / 80 = 41.3 %  05/20/2024: 53/80    SENSATION: Neuropathy in bilat LEs -- knee to ankle  EDEMA:  Will swell if legs are down  MUSCLE LENGTH: Hamstrings: See knee AROM below  POSTURE: flexed trunk , weight shift left, and knees flexed  PALPATION: TTP medial knee/adductors  LOWER EXTREMITY ROM:  Active ROM Right eval Left eval Right 05/01/24 Left 05/01/24  Hip flexion sitting 95 PROM WNL in sup At  least 110 deg 95 supine 110 supine  Hip extension      Hip abduction      Hip adduction      Hip internal rotation      Hip external rotation      Knee flexion sitting 80 110 85 110  Knee extension sitting -32 PROM can get -10 in sup -10 -9 -6  Ankle dorsiflexion      Ankle plantarflexion      Ankle inversion      Ankle eversion       (Blank rows = not tested)  LOWER EXTREMITY MMT:   MMT Right eval Left eval  Hip flexion 3- 3+  Hip extension    Hip abduction supine 3+ 3+  Hip adduction    Hip internal rotation    Hip external rotation    Knee flexion 4 4  Knee extension 3- 3+  Ankle dorsiflexion    Ankle plantarflexion    Ankle inversion    Ankle eversion     (Blank rows = not tested)  LOWER EXTREMITY SPECIAL TESTS:  Hip special tests: Belvie (FABER) test:  positive  and Thomas test: positive   FUNCTIONAL TESTS:  5 times sit to stand: with UE support 49.86 sec 05/20/2024: 1m, UE support from mat, height parallel with arm chair : 300' w/ FWW  GAIT: Distance walked: Into clinic Assistive device utilized: Environmental Consultant - 2 wheeled Level of assistance: Modified independence Comments: Diminished swing phase on R, bilat steppage gait, diminished heel/toe on R, flexed trunk and knees  [facility] Adult PT Treatment:                                                DATE: 07/07/24 Therapeutic Exercise: Sitting LAQ eccentrics R only x15, strap assist to complete full concentric ext and eccentric lowering as needed Heel slide x15 c use of strap at EOM for stretch SLR c quad set x15, strap for concentric assist and eccentric lowering as needed Bridge x15 partial H/L R LE clam BluTB Therapeutic Activity: Nustep x5 mins L5 UE/LE Standing knee lifts x10 each Standing TKE 2x12 RTB STS c hand assist x10, pt favored the R leg  Therapeutic Exercise: *** Manual Therapy: *** Neuromuscular re-ed: *** Therapeutic Activity: *** Modalities: *** Self Care: ***  [facility] Adult [facility]  Treatment:                                                DATE: 06/25/24 Therapeutic Exercise: Sitting LAQ eccentrics R only x15, strap assist to complete full concentric ext and eccentric lowering as needed Heel slide x15 c use of strap at EOM for stretch SLR c quad set x15, strap for concentric assist and eccentric lowering as needed Bridge x15 partial H/L R LE clam BluTB Therapeutic Activity: Nustep x5 mins L5 UE/LE Standing knee lifts x10 each Standing TKE 2x12 RTB STS c hand assist x10, pt favored the R leg  [facility] Adult PT Treatment:                                                DATE: 06/23/24 Therapeutic Exercise: Sitting LAQ eccentrics R only 2x10, strap assist to complete full concentric ext and eccentric lowering as needed SLR c quad set 2x8, strap for concentric assist and eccentric lowering as needed Therapeutic Activity: Standing TKE 2x12 RTB STS c hand assist x10, pt favored the R leg  PATIENT EDUCATION:  Education details: Exam findings, POC, initial HEP Person educated: Patient Education method: Explanation, Demonstration, and Handouts Education comprehension: verbalized understanding, returned demonstration, and needs further education  HOME EXERCISE PROGRAM: Access Code: [email or web address] URL: [email or web address] Date: 04/23/2024 Prepared by: Gellen April Marie Nonato  Exercises - Seated Heel Slide  - 1 x daily - 7 x weekly - 2 sets - 10 reps - Seated Quad Set  - 1 x daily - 7 x weekly - 2 sets - 10 reps - 3 sec hold - Seated Hip Abduction with Resistance  - 2 x daily - 7 x weekly - 1 sets - 10 reps - 3 sec hold - Seated Hip Flexion Isometric  - 2 x daily - 7 x weekly - 1 sets - 10 reps - 3 sec hold -  Seated Active Hip Flexion  - 2 x daily - 7 x weekly - 1 sets - 10 reps - Beginner Bridge  - 2 x daily - 7 x weekly - 1 sets - 10 reps  ASSESSMENT:  CLINICAL IMPRESSION: PT was completed for R LE strengthening both in the open and closed kinetic chains.  R hip flexor and quad strength continues to be markedly decreased. Will reassess LTGS her next appt for continuation of PT vs DC. Anticipate continuation with pt's fall risk.  Progress note 05/20/2024: Pt attended physical therapy session for re-evaluation of R hip ORIF. Pt is progressing well and has met 6 goals, pt continues to work towards 2 other initial goals. Difficulties continue with RLE strength, and transfer quality necessary for a safe return to independent lifestyle inline with PLOF. 2 additional goals were added to further progress quality of AROM and ambulation quality necessary for return to higher level community participation. Pt required minimal cuing as well as no assistance for safe and appropriate performance of today's activities. Continue with therapeutic focus on RLE strengthening, quadriceps recruitment,ambulation quality, and transfer quality. Education was given to continue applying ADL education from previous sessions as well as performing HEP as prescribed with freedom to progress as tolerated using previous education on modification and exercise dosage. Pt has displayed and verbalized competence regarding this education.   Pt continues to require the intervention of skilled outpatient physical therapy to address the aforementioned deficits and progress towards a functional level in line with therapeutic goals.  OBJECTIVE IMPAIRMENTS: Abnormal gait, decreased activity tolerance, decreased balance, decreased coordination, decreased endurance, decreased mobility, difficulty walking, decreased ROM, decreased strength, hypomobility, increased fascial restrictions, impaired flexibility, improper body mechanics, postural dysfunction, and pain.   ACTIVITY LIMITATIONS: carrying, lifting, bending, sitting, standing, squatting, sleeping, stairs, transfers, bed mobility, dressing, hygiene/grooming, and locomotion level  PARTICIPATION LIMITATIONS: meal prep, cleaning, driving, shopping, and  community activity  PERSONAL FACTORS: Age, Fitness, Past/current experiences, and Time since onset of injury/illness/exacerbation are also affecting patient's functional outcome.   REHAB POTENTIAL: Good  CLINICAL DECISION MAKING: Evolving/moderate complexity  EVALUATION COMPLEXITY: Moderate   GOALS: Goals reviewed with patient? Yes  SHORT TERM GOALS: Target date: 05/13/2024  Pt will be ind with initial HEP Baseline: Goal status: MET Pt reports adherence 05/15/24  2.  Pt will have improved hip flexion to at least 110 deg AROM Baseline:  Goal status: MET (in supine, and seated AAROM)  3.  Pt will report improved medial thigh/knee pain by >/=25% Baseline:  Goal status: MET 05/15/24: Pt reports max pain at 6/10   LONG TERM GOALS: Target date: 07/21/2024   Pt will be ind with management and progression of HEP Baseline:  Goal status: MET 05/20/2024  2.  Pt will be able to amb at least 400' with LRAD for increased home and limited community mobility Baseline:  Goal status: MET 05/20/2024 (walked around 500')  3.  Pt will have improved 5x STS to </=20 sec to reduce her fall risk and increase functional LE strength Baseline:  Goal status: ONGOING 05/20/2024  4.  Pt will have improved R knee ext to within at least 10 deg of full extension to demo increased quad strength Baseline: 32 deg away from full ext Goal status: ONGOING (10d AROM, in sitting, 0 AAROM)  5.  Pt will have improved LEFS score to >/=42/80 to demo MCID Baseline: 33/80 05/20/2024: 53/80 Goal status: MET 05/20/2024  6. Pt will improve hip flexion to 110d AROM in seated  Baseline: 110d AAROM  Goal status INITIAL 05/20/2024  7.  Pt will independently ambulate 500' with LRAD within 6 minutes to demonstrate improved weightbearing tolerance, BLE strength, and functional capacity for community ambulation.  Baseline: - 300' FWW  Goal status INITIAL 05/20/2024   PLAN:  PT FREQUENCY: 2x/week  PT DURATION: 8  weeks  PLANNED INTERVENTIONS: 97164- PT Re-evaluation, 97750- Physical Performance Testing, 97110-Therapeutic exercises, 97530- Therapeutic activity, 97112- Neuromuscular re-education, 97535- Self Care, [id number]- Manual therapy, [phone]- Gait training, [phone]- Aquatic Therapy, [phone]- Electrical stimulation (unattended), 97016- Vasopneumatic device, [id number]- Ultrasound, [id number]- Ionotophoresis 4mg /ml Dexamethasone , 79439 (1-2 muscles), [id number] (3+ muscles)- Dry Needling, Patient/Family education, Balance training, Stair training, Taping, Joint mobilization, Spinal mobilization, Vestibular training, Cryotherapy, and Moist heat  PLAN FOR NEXT SESSION: Continue with therapeutic focus on RLE strengthening, quadriceps recruitment,ambulation quality, and transfer quality.   Renee Reach MS, PT 07/06/24 7:35 PM

## 2024-07-07 ENCOUNTER — Ambulatory Visit

## 2024-07-07 NOTE — Therapy (Signed)
 OUTPATIENT PHYSICAL THERAPY TREATMENT/Recert/ReAuth   Patient Name: Renee Watts MRN: [id number] DOB:[birth year], 73 y.o., female Today's Date: 07/08/2024  END OF SESSION:  PT End of Session - 07/08/24 1113     Visit Number 20    Number of Visits 16    Date for Recertification  09/11/24    Authorization Type [employer]    Progress Note Due on Visit 20    PT Start Time 1105    PT Stop Time 1150    PT Time Calculation (min) 45 min    Activity Tolerance Patient tolerated treatment well    Behavior During Therapy [facility] for tasks assessed/performed             Past Medical History:  Diagnosis Date   Allergy    Benign paroxysmal positional vertigo 06/25/2013   dx with prior PCP, s/p labs, mri and vestibular rehab; nasal spray for etd seemed to help   Blood in stool 03/11/2012   Dependent edema    Bilateral   Depression    GERD (gastroesophageal reflux disease)    on meds   Hyperlipidemia    on meds   Low back pain    sciatica; sees orthopedic specialist ([facility])for this and shoulder tendinitis   Obesity, diabetes, and hypertension syndrome (HCC)    on med   Osteoarthritis, knee    Bilateral, s/p 3 total knee replacement surgerues on right, last 2005. Dr. Duwayne   Pellegrini-Steida syndrome    Myositis ossificans   Seasonal allergies    Shingles 07/10/2012   LESIONS CLEARED BUT NOT TOLERATING ANY THING TIGHT AGAINST BODY--SHINGLES WERE AROSS BACK AND ABDOMEN   Past Surgical History:  Procedure Laterality Date   ABDOMINAL HYSTERECTOMY  1984   BLADDER SURGERY  03/2017   bladder tac per pt   COLONOSCOPY  2020   MS-MAC-goly (good)-hems/TAx5)   left shoulder steroid injection     ORIF FEMUR FRACTURE Right 12/06/2023   Procedure: OPEN REDUCTION INTERNAL FIXATION (ORIF) DISTAL FEMUR FRACTURE;  Surgeon: Kendal Franky SQUIBB, MD;  Location: [facility] OR;  Service: Orthopedics;  Laterality: Right;   POLYPECTOMY  2020   TA x 5   TONSILLECTOMY  1977   TOTAL HIP  ARTHROPLASTY Right 10/10/2012   Procedure: R TOTAL HIP ARTHROPLASTY ANTERIOR APPROACH;  Surgeon: Lonni CINDERELLA Poli, MD;  Location: [facility];  Service: Orthopedics;  Laterality: Right;  Right Total Hip Arthroplasty, Anterior Approach (C-Arm)   TOTAL KNEE ARTHROPLASTY Right    with multiple revisions, 2003, 2005   TOTAL KNEE ARTHROPLASTY Left 01/09/2013   Procedure: LEFT TOTAL KNEE ARTHROPLASTY;  Surgeon: Lonni CINDERELLA Poli, MD;  Location: [facility];  Service: Orthopedics;  Laterality: Left;   TUBAL LIGATION  1978   WISDOM TOOTH EXTRACTION  2013,2014   Patient Active Problem List   Diagnosis Date Noted   Closed fracture of distal end of right femur, unspecified fracture morphology, initial encounter (HCC) 12/05/2023   Lumbar radiculopathy 09/17/2023   Osteopenia 02/15/2021   Venous stasis of both lower extremities 06/27/2020   Bilateral lower extremity edema 02/05/2020   Recurrent sinusitis 09/03/2018   Vaginal vault prolapse after hysterectomy 02/13/2017   Obesity 01/06/2016   Low back pain 01/06/2016   Normocytic anemia 03/12/2013   Diabetic peripheral neuropathy (HCC) 09/01/2012   Hyperlipemia 09/16/2006   Hypertension associated with diabetes (HCC) 09/16/2006   GERD 09/16/2006   Type II diabetes mellitus with neurological manifestations (HCC) 06/17/2006   Osteoarthritis 06/17/2006   DEPENDENT EDEMA, LEGS, BILATERAL 06/17/2006  PCP: Mercer Clotilda SAUNDERS, MD  REFERRING PROVIDER: Kendal Franky SQUIBB, MD  REFERRING DIAG: ORIF of right distal fracture performed on 12/06/23  THERAPY DIAG:  Pain in right leg  Muscle weakness (generalized)  Other abnormalities of gait and mobility  Rationale for Evaluation and Treatment: Rehabilitation  ONSET DATE: 12/06/23  SUBJECTIVE:   SUBJECTIVE STATEMENT: Pt reports her R leg weakness may be related the multiple surgeries she had with a THA and several revisions for TKA's.  From eval: Pt states she has been doing home health PT since  her 12/06/23 hip surgery. Was d/c-ed ~1 month ago. Was able to walk 500 feet with PT. Pt reports pain is more in the inside of her medial thigh. No pain where the incision is laterally. Has been using quad cane intermittently in the house. Pt is fearful of another fall. Pt notes she gets fatigued with prolonged standing but not limited by pain.   PERTINENT HISTORY: L3/4 laminectomy and PSIF on 09/17/23; neuropathy in legs, bilat knee replacement, R hip replacement   PAIN:  Are you having pain? Yes: NPRS scale: 6 currently for thigh muscles; at worst 10/10 Pain location: R medial thigh Pain description: sometimes sharp or achy Aggravating factors: Rolling in bed, stepping wrong, getting up quickly Relieving factors: hydrocodone , ice/heat  PRECAUTIONS: Fall  RED FLAGS: None   WEIGHT BEARING RESTRICTIONS: WBAT with no ROM restrictions per 12/06/23 surgery note  FALLS:  Has patient fallen in last 6 months? Yes. Number of falls 1  -- 12/05/23 resulting in her femur fracture  LIVING ENVIRONMENT: Lives with: lives with their spouse; children and grandchildren are in and out Lives in: House/apartment Stairs: 1 step up to enter Has following equipment at home: Single point cane, Quad cane small base, Walker - 2 wheeled, Grab bars, and walk in shower  OCCUPATION: Retired - loves to the [employer] and entertainment  PLOF: Independent  PATIENT GOALS: Return to cooking and normal activities without having to stop and rest  NEXT MD VISIT: 06/04/24 to see PCP  OBJECTIVE:  Note: Objective measures were completed at Evaluation unless otherwise noted.  DIAGNOSTIC FINDINGS: X-rays of the lumbar spine from 03/09/2024 were independently reviewed and interpreted, showing laminectomy defect at L3/4.  Spondylolisthesis at L3/4 that does not shift between flexion and extension views.  Posterior instrumentation at L3 and L4.  No lucency seen around the screws and none of them have backed out.  No fracture or  dislocation seen.   PATIENT SURVEYS:  Lower Extremity Functional Score: 33 / 80 = 41.3 %  05/20/2024: 53/80    SENSATION: Neuropathy in bilat LEs -- knee to ankle  EDEMA:  Will swell if legs are down  MUSCLE LENGTH: Hamstrings: See knee AROM below  POSTURE: flexed trunk , weight shift left, and knees flexed  PALPATION: TTP medial knee/adductors  LOWER EXTREMITY ROM:  Active ROM Right eval Left eval Right 05/01/24 Left 05/01/24  Hip flexion sitting 95 PROM WNL in sup At least 110 deg 95 supine 110 supine  Hip extension      Hip abduction      Hip adduction      Hip internal rotation      Hip external rotation      Knee flexion sitting 80 110 85 110  Knee extension sitting -32 PROM can get -10 in sup -10 -9 -6  Ankle dorsiflexion      Ankle plantarflexion      Ankle inversion      Ankle eversion       (  Blank rows = not tested)  LOWER EXTREMITY MMT:   MMT Right eval Left eval  Hip flexion 3- 3+  Hip extension    Hip abduction supine 3+ 3+  Hip adduction    Hip internal rotation    Hip external rotation    Knee flexion 4 4  Knee extension 3- 3+  Ankle dorsiflexion    Ankle plantarflexion    Ankle inversion    Ankle eversion     (Blank rows = not tested)  LOWER EXTREMITY SPECIAL TESTS:  Hip special tests: Belvie (FABER) test: positive  and Thomas test: positive   FUNCTIONAL TESTS:  5 times sit to stand: with UE support 49.86 sec 05/20/2024: 65m, UE support from mat, height parallel with arm chair : 300' w/ FWW  GAIT: Distance walked: Into clinic Assistive device utilized: Environmental Consultant - 2 wheeled Level of assistance: Modified independence Comments: Diminished swing phase on R, bilat steppage gait, diminished heel/toe on R, flexed trunk and knees  [facility] Adult PT Treatment:                                                DATE: 07/08/24 Therapeutic Exercise: Sitting LAQ eccentrics R only x15, strap assist to complete full concentric ext and eccentric  lowering as needed Heel slide x15 c use of strap at EOM for stretch SLR c quad set x15, strap for concentric assist and eccentric lowering as needed Bridge 2x10 partial Therapeutic Activity: Nustep x8 mins L5 UE/LE 5xSTS 6 MWT STS x10 c use of hands Standing knee lifts 2x10 each Standing TKE 2x12 RTB  [facility] Adult PT Treatment:                                                DATE: 06/25/24 Therapeutic Exercise: Sitting LAQ eccentrics R only x15, strap assist to complete full concentric ext and eccentric lowering as needed Heel slide x15 c use of strap at EOM for stretch SLR c quad set x15, strap for concentric assist and eccentric lowering as needed Bridge x15 partial H/L R LE clam BluTB Therapeutic Activity: Nustep x5 mins L5 UE/LE Standing knee lifts x10 each Standing TKE 2x12 RTB STS c hand assist x10, pt favored the R leg  [facility] Adult PT Treatment:                                                DATE: 06/23/24 Therapeutic Exercise: Sitting LAQ eccentrics R only 2x10, strap assist to complete full concentric ext and eccentric lowering as needed SLR c quad set 2x8, strap for concentric assist and eccentric lowering as needed Therapeutic Activity: Standing TKE 2x12 RTB STS c hand assist x10, pt favored the R leg  PATIENT EDUCATION:  Education details: Exam findings, POC, initial HEP Person educated: Patient Education method: Explanation, Demonstration, and Handouts Education comprehension: verbalized understanding, returned demonstration, and needs further education  HOME EXERCISE PROGRAM: Access Code: [email or web address] URL: [email or web address] Date: 04/23/2024 Prepared by: Gellen April Marie Nonato  Exercises - Seated Heel Slide  - 1 x daily - 7 x weekly - 2 sets -  10 reps - Seated Quad Set  - 1 x daily - 7 x weekly - 2 sets - 10 reps - 3 sec hold - Seated Hip Abduction with Resistance  - 2 x daily - 7 x weekly - 1 sets - 10 reps - 3 sec hold - Seated Hip Flexion  Isometric  - 2 x daily - 7 x weekly - 1 sets - 10 reps - 3 sec hold - Seated Active Hip Flexion  - 2 x daily - 7 x weekly - 1 sets - 10 reps - Beginner Bridge  - 2 x daily - 7 x weekly - 1 sets - 10 reps  ASSESSMENT:  CLINICAL IMPRESSION: Assessed 5xSTS, 6 MWT, and walking tolerance. 5xSTS is significantly declined as well as her . Pt's walking tolerance, walking distance, has improved, but not the pace. Pt reports functioning in her home is better c bed mobility, sitting to standing, and standing tolerance in the kitchen. Pt's progress in PT has been very slow, but gains are being made. Progress has been impacted by the the significant weakness of the R hip and knee seeming related to the multiple surgeries she has had on this leg. A FWW continues to be the most appropriate AD at this time. Pt will continue to benefit from skilled PT 2w8 to address impairments for improved functional mobility and safety at home.   Progress note 05/20/2024: Pt attended physical therapy session for re-evaluation of R hip ORIF. Pt is progressing well and has met 6 goals, pt continues to work towards 2 other initial goals. Difficulties continue with RLE strength, and transfer quality necessary for a safe return to independent lifestyle inline with PLOF. 2 additional goals were added to further progress quality of AROM and ambulation quality necessary for return to higher level community participation. Pt required minimal cuing as well as no assistance for safe and appropriate performance of today's activities. Continue with therapeutic focus on RLE strengthening, quadriceps recruitment,ambulation quality, and transfer quality. Education was given to continue applying ADL education from previous sessions as well as performing HEP as prescribed with freedom to progress as tolerated using previous education on modification and exercise dosage. Pt has displayed and verbalized competence regarding this education.   Pt  continues to require the intervention of skilled outpatient physical therapy to address the aforementioned deficits and progress towards a functional level in line with therapeutic goals.  OBJECTIVE IMPAIRMENTS: Abnormal gait, decreased activity tolerance, decreased balance, decreased coordination, decreased endurance, decreased mobility, difficulty walking, decreased ROM, decreased strength, hypomobility, increased fascial restrictions, impaired flexibility, improper body mechanics, postural dysfunction, and pain.   ACTIVITY LIMITATIONS: carrying, lifting, bending, sitting, standing, squatting, sleeping, stairs, transfers, bed mobility, dressing, hygiene/grooming, and locomotion level  PARTICIPATION LIMITATIONS: meal prep, cleaning, driving, shopping, and community activity  PERSONAL FACTORS: Age, Fitness, Past/current experiences, and Time since onset of injury/illness/exacerbation are also affecting patient's functional outcome.   REHAB POTENTIAL: Good  CLINICAL DECISION MAKING: Evolving/moderate complexity  EVALUATION COMPLEXITY: Moderate   GOALS: Goals reviewed with patient? Yes  SHORT TERM GOALS: Target date: 05/13/2024  Pt will be ind with initial HEP Baseline: Goal status: MET Pt reports adherence 05/15/24  2.  Pt will have improved hip flexion to at least 110 deg AROM Baseline:  Goal status: MET (in supine, and seated AAROM)  3.  Pt will report improved medial thigh/knee pain by >/=25% Baseline:  Goal status: MET 05/15/24: Pt reports max pain at 6/10   LONG TERM GOALS: Target date:  07/21/2024   Pt will be ind with management and progression of HEP Baseline:  Goal status: MET 05/20/2024  2.  Pt will be able to amb at least 400' with LRAD for increased home and limited community mobility Baseline:  Goal status: MET 05/20/2024 (walked around 500') 07/08/24= 600'  3.  Pt will have improved 5x STS to </=20 sec to reduce her fall risk and increase functional LE  strength Baseline: 35.6 c use of hands to RW Goal status: ONGOING  4.  Pt will have improved R knee ext to within at least 10 deg of full extension to demo increased quad strength Baseline: 32 deg away from full ext 07/08/24: 30d from full ext Goal status: ONGOING  5.  Pt will have improved LEFS score to >/=42/80 to demo MCID Baseline: 33/80 05/20/2024: 53/80 Goal status: MET 05/20/2024  6. Pt will improve hip flexion to 110d AROM in seated   Baseline: 110d AAROM  07/08/24: 90d  Goal status:ONGOING  7.  Pt will independently ambulate 500' with LRAD within 6 minutes to demonstrate improved weightbearing tolerance, BLE strength, and functional capacity for community ambulation.  Baseline: - 300' FWW  07/08/24: 300' FWW  Goal status: ONGOING   PLAN:  PT FREQUENCY: 2x/week  PT DURATION: 8 weeks  PLANNED INTERVENTIONS: 97164- PT Re-evaluation, 97750- Physical Performance Testing, 97110-Therapeutic exercises, 97530- Therapeutic activity, [id number]- Neuromuscular re-education, 97535- Self Care, [id number]- Manual therapy, [phone]- Gait training, [phone]- Aquatic Therapy, [phone]- Electrical stimulation (unattended), 97016- Vasopneumatic device, [id number]- Ultrasound, [id number]- Ionotophoresis 4mg /ml Dexamethasone , 79439 (1-2 muscles), [id number] (3+ muscles)- Dry Needling, Patient/Family education, Balance training, Stair training, Taping, Joint mobilization, Spinal mobilization, Vestibular training, Cryotherapy, and Moist heat  PLAN FOR NEXT SESSION: Continue with therapeutic focus on RLE strengthening, quadriceps recruitment,ambulation quality, and transfer quality.   Johnesha Acheampong MS, PT 07/08/24 12:46 PM

## 2024-07-08 ENCOUNTER — Ambulatory Visit: Attending: Student

## 2024-07-08 DIAGNOSIS — M6281 Muscle weakness (generalized): Secondary | ICD-10-CM | POA: Insufficient documentation

## 2024-07-08 DIAGNOSIS — M79604 Pain in right leg: Secondary | ICD-10-CM | POA: Diagnosis not present

## 2024-07-08 DIAGNOSIS — R2689 Other abnormalities of gait and mobility: Secondary | ICD-10-CM | POA: Insufficient documentation

## 2024-07-09 DIAGNOSIS — H2512 Age-related nuclear cataract, left eye: Secondary | ICD-10-CM | POA: Diagnosis not present

## 2024-07-13 ENCOUNTER — Ambulatory Visit

## 2024-07-13 DIAGNOSIS — R2689 Other abnormalities of gait and mobility: Secondary | ICD-10-CM

## 2024-07-13 DIAGNOSIS — M6281 Muscle weakness (generalized): Secondary | ICD-10-CM

## 2024-07-13 DIAGNOSIS — M79604 Pain in right leg: Secondary | ICD-10-CM | POA: Diagnosis not present

## 2024-07-13 NOTE — Therapy (Signed)
 OUTPATIENT PHYSICAL THERAPY TREATMENT   Patient Name: Renee Watts MRN: [id number] DOB:01-14-1951, 73 y.o., female Today's Date: 07/13/2024  END OF SESSION:  PT End of Session - 07/13/24 0823     Visit Number 21    Number of Visits --    Date for Recertification  09/11/24    Authorization Type [employer]    Progress Note Due on Visit 20    PT Start Time 0830    PT Stop Time 0910    PT Time Calculation (min) 40 min    Activity Tolerance Patient tolerated treatment well    Behavior During Therapy [facility] for tasks assessed/performed          Past Medical History:  Diagnosis Date   Allergy    Benign paroxysmal positional vertigo 06/25/2013   dx with prior PCP, s/p labs, mri and vestibular rehab; nasal spray for etd seemed to help   Blood in stool 03/11/2012   Dependent edema    Bilateral   Depression    GERD (gastroesophageal reflux disease)    on meds   Hyperlipidemia    on meds   Low back pain    sciatica; sees orthopedic specialist ([facility])for this and shoulder tendinitis   Obesity, diabetes, and hypertension syndrome (HCC)    on med   Osteoarthritis, knee    Bilateral, s/p 3 total knee replacement surgerues on right, last 2005. Dr. Duwayne   Pellegrini-Steida syndrome    Myositis ossificans   Seasonal allergies    Shingles 07/10/2012   LESIONS CLEARED BUT NOT TOLERATING ANY THING TIGHT AGAINST BODY--SHINGLES WERE AROSS BACK AND ABDOMEN   Past Surgical History:  Procedure Laterality Date   ABDOMINAL HYSTERECTOMY  1984   BLADDER SURGERY  03/2017   bladder tac per pt   COLONOSCOPY  2020   MS-MAC-goly (good)-hems/TAx5)   left shoulder steroid injection     ORIF FEMUR FRACTURE Right 12/06/2023   Procedure: OPEN REDUCTION INTERNAL FIXATION (ORIF) DISTAL FEMUR FRACTURE;  Surgeon: Kendal Franky SQUIBB, MD;  Location: [facility] OR;  Service: Orthopedics;  Laterality: Right;   POLYPECTOMY  2020   TA x 5   TONSILLECTOMY  1977   TOTAL HIP ARTHROPLASTY Right  10/10/2012   Procedure: R TOTAL HIP ARTHROPLASTY ANTERIOR APPROACH;  Surgeon: Lonni CINDERELLA Poli, MD;  Location: [facility] ORS;  Service: Orthopedics;  Laterality: Right;  Right Total Hip Arthroplasty, Anterior Approach (C-Arm)   TOTAL KNEE ARTHROPLASTY Right    with multiple revisions, 2003, 2005   TOTAL KNEE ARTHROPLASTY Left 01/09/2013   Procedure: LEFT TOTAL KNEE ARTHROPLASTY;  Surgeon: Lonni CINDERELLA Poli, MD;  Location: [facility];  Service: Orthopedics;  Laterality: Left;   TUBAL LIGATION  1978   WISDOM TOOTH EXTRACTION  2013,2014   Patient Active Problem List   Diagnosis Date Noted   Closed fracture of distal end of right femur, unspecified fracture morphology, initial encounter (HCC) 12/05/2023   Lumbar radiculopathy 09/17/2023   Osteopenia 02/15/2021   Venous stasis of both lower extremities 06/27/2020   Bilateral lower extremity edema 02/05/2020   Recurrent sinusitis 09/03/2018   Vaginal vault prolapse after hysterectomy 02/13/2017   Obesity 01/06/2016   Low back pain 01/06/2016   Normocytic anemia 03/12/2013   Diabetic peripheral neuropathy (HCC) 09/01/2012   Hyperlipemia 09/16/2006   Hypertension associated with diabetes (HCC) 09/16/2006   GERD 09/16/2006   Type II diabetes mellitus with neurological manifestations (HCC) 06/17/2006   Osteoarthritis 06/17/2006   DEPENDENT EDEMA, LEGS, BILATERAL 06/17/2006  PCP: Mercer Clotilda SAUNDERS, MD  REFERRING PROVIDER: Kendal Franky SQUIBB, MD  REFERRING DIAG: ORIF of right distal fracture performed on 12/06/23  THERAPY DIAG:  Pain in right leg  Muscle weakness (generalized)  Other abnormalities of gait and mobility  Rationale for Evaluation and Treatment: Rehabilitation  ONSET DATE: 12/06/23  SUBJECTIVE:   SUBJECTIVE STATEMENT: Patient reports that she is having some stiffness in her right leg, and was sore after her last session. Patient has been compliant with her HEP.  From eval: Pt states she has been doing home health PT  since her 12/06/23 hip surgery. Was d/c-ed ~1 month ago. Was able to walk 500 feet with PT. Pt reports pain is more in the inside of her medial thigh. No pain where the incision is laterally. Has been using quad cane intermittently in the house. Pt is fearful of another fall. Pt notes she gets fatigued with prolonged standing but not limited by pain.   PERTINENT HISTORY: L3/4 laminectomy and PSIF on 09/17/23; neuropathy in legs, bilat knee replacement, R hip replacement   PAIN:  Are you having pain? Yes: NPRS scale: 6 currently for thigh muscles; at worst 10/10 Pain location: R medial thigh Pain description: sometimes sharp or achy Aggravating factors: Rolling in bed, stepping wrong, getting up quickly Relieving factors: hydrocodone , ice/heat  PRECAUTIONS: Fall  RED FLAGS: None   WEIGHT BEARING RESTRICTIONS: WBAT with no ROM restrictions per 12/06/23 surgery note  FALLS:  Has patient fallen in last 6 months? Yes. Number of falls 1  -- 12/05/23 resulting in her femur fracture  LIVING ENVIRONMENT: Lives with: lives with their spouse; children and grandchildren are in and out Lives in: House/apartment Stairs: 1 step up to enter Has following equipment at home: Single point cane, Quad cane small base, Walker - 2 wheeled, Grab bars, and walk in shower  OCCUPATION: Retired - loves to the [employer] and entertainment  PLOF: Independent  PATIENT GOALS: Return to cooking and normal activities without having to stop and rest  NEXT MD VISIT: 06/04/24 to see PCP  OBJECTIVE:  Note: Objective measures were completed at Evaluation unless otherwise noted.  DIAGNOSTIC FINDINGS: X-rays of the lumbar spine from 03/09/2024 were independently reviewed and interpreted, showing laminectomy defect at L3/4.  Spondylolisthesis at L3/4 that does not shift between flexion and extension views.  Posterior instrumentation at L3 and L4.  No lucency seen around the screws and none of them have backed out.  No fracture or  dislocation seen.   PATIENT SURVEYS:  Lower Extremity Functional Score: 33 / 80 = 41.3 %  05/20/2024: 53/80    SENSATION: Neuropathy in bilat LEs -- knee to ankle  EDEMA:  Will swell if legs are down  MUSCLE LENGTH: Hamstrings: See knee AROM below  POSTURE: flexed trunk , weight shift left, and knees flexed  PALPATION: TTP medial knee/adductors  LOWER EXTREMITY ROM:  Active ROM Right eval Left eval Right 05/01/24 Left 05/01/24  Hip flexion sitting 95 PROM WNL in sup At least 110 deg 95 supine 110 supine  Hip extension      Hip abduction      Hip adduction      Hip internal rotation      Hip external rotation      Knee flexion sitting 80 110 85 110  Knee extension sitting -32 PROM can get -10 in sup -10 -9 -6  Ankle dorsiflexion      Ankle plantarflexion      Ankle inversion  Ankle eversion       (Blank rows = not tested)  LOWER EXTREMITY MMT:   MMT Right eval Left eval  Hip flexion 3- 3+  Hip extension    Hip abduction supine 3+ 3+  Hip adduction    Hip internal rotation    Hip external rotation    Knee flexion 4 4  Knee extension 3- 3+  Ankle dorsiflexion    Ankle plantarflexion    Ankle inversion    Ankle eversion     (Blank rows = not tested)  LOWER EXTREMITY SPECIAL TESTS:  Hip special tests: Belvie (FABER) test: positive  and Thomas test: positive   FUNCTIONAL TESTS:  5 times sit to stand: with UE support 49.86 sec 05/20/2024: 71m, UE support from mat, height parallel with arm chair : 300' w/ FWW  GAIT: Distance walked: Into clinic Assistive device utilized: Environmental Consultant - 2 wheeled Level of assistance: Modified independence Comments: Diminished swing phase on R, bilat steppage gait, diminished heel/toe on R, flexed trunk and knees  [facility] Adult PT Treatment:                                                DATE: 07/13/24 Therapeutic Exercise: Nustep level 5 x 5 mins Heel slides with strap 2x10 SLR 2x10 with strap Seated hamstring curls  RTB 2x10 Seated LAQ 2x10 Supine hip abduction x10 GTB Therapeutic Activity: STS x5 Seated marches 3x15  [facility] Adult PT Treatment:                                                DATE: 07/08/24 Therapeutic Exercise: Sitting LAQ eccentrics R only x15, strap assist to complete full concentric ext and eccentric lowering as needed Heel slide x15 c use of strap at EOM for stretch SLR c quad set x15, strap for concentric assist and eccentric lowering as needed Bridge 2x10 partial Therapeutic Activity: Nustep x8 mins L5 UE/LE 5xSTS 6 MWT STS x10 c use of hands Standing knee lifts 2x10 each Standing TKE 2x12 RTB  [facility] Adult PT Treatment:                                                DATE: 06/25/24 Therapeutic Exercise: Sitting LAQ eccentrics R only x15, strap assist to complete full concentric ext and eccentric lowering as needed Heel slide x15 c use of strap at EOM for stretch SLR c quad set x15, strap for concentric assist and eccentric lowering as needed Bridge x15 partial H/L R LE clam BluTB Therapeutic Activity: Nustep x5 mins L5 UE/LE Standing knee lifts x10 each Standing TKE 2x12 RTB STS c hand assist x10, pt favored the R leg  [facility] Adult PT Treatment:                                                DATE: 06/23/24 Therapeutic Exercise: Sitting LAQ eccentrics R only 2x10, strap assist to complete full concentric ext and eccentric  lowering as needed SLR c quad set 2x8, strap for concentric assist and eccentric lowering as needed Therapeutic Activity: Standing TKE 2x12 RTB STS c hand assist x10, pt favored the R leg  PATIENT EDUCATION:  Education details: Exam findings, POC, initial HEP Person educated: Patient Education method: Explanation, Demonstration, and Handouts Education comprehension: verbalized understanding, returned demonstration, and needs further education  HOME EXERCISE PROGRAM: Access Code: [email or web address] URL: [email or web address] Date:  04/23/2024 Prepared by: Gellen April Marie Nonato  Exercises - Seated Heel Slide  - 1 x daily - 7 x weekly - 2 sets - 10 reps - Seated Quad Set  - 1 x daily - 7 x weekly - 2 sets - 10 reps - 3 sec hold - Seated Hip Abduction with Resistance  - 2 x daily - 7 x weekly - 1 sets - 10 reps - 3 sec hold - Seated Hip Flexion Isometric  - 2 x daily - 7 x weekly - 1 sets - 10 reps - 3 sec hold - Seated Active Hip Flexion  - 2 x daily - 7 x weekly - 1 sets - 10 reps - Beginner Bridge  - 2 x daily - 7 x weekly - 1 sets - 10 reps  ASSESSMENT:  CLINICAL IMPRESSION: Patient presents to PT reporting that she's had some stiffness in her RLE. She states that she has been doing her exercises at home and they seem to be helping. Today's session focused on strengthening the RLE and increasing mobility. Patient tolerated all exercises well, continues to have some difficulty with hip flexion strength. Patient needs longer time to complete sit to stand transfer with UE support, and weight shifts onto LLE. Patient will benefit from skilled PT to improve functional abilities and mobility.  Progress note 05/20/2024: Pt attended physical therapy session for re-evaluation of R hip ORIF. Pt is progressing well and has met 6 goals, pt continues to work towards 2 other initial goals. Difficulties continue with RLE strength, and transfer quality necessary for a safe return to independent lifestyle inline with PLOF. 2 additional goals were added to further progress quality of AROM and ambulation quality necessary for return to higher level community participation. Pt required minimal cuing as well as no assistance for safe and appropriate performance of today's activities. Continue with therapeutic focus on RLE strengthening, quadriceps recruitment,ambulation quality, and transfer quality. Education was given to continue applying ADL education from previous sessions as well as performing HEP as prescribed with freedom to progress as  tolerated using previous education on modification and exercise dosage. Pt has displayed and verbalized competence regarding this education.   Pt continues to require the intervention of skilled outpatient physical therapy to address the aforementioned deficits and progress towards a functional level in line with therapeutic goals.  OBJECTIVE IMPAIRMENTS: Abnormal gait, decreased activity tolerance, decreased balance, decreased coordination, decreased endurance, decreased mobility, difficulty walking, decreased ROM, decreased strength, hypomobility, increased fascial restrictions, impaired flexibility, improper body mechanics, postural dysfunction, and pain.   ACTIVITY LIMITATIONS: carrying, lifting, bending, sitting, standing, squatting, sleeping, stairs, transfers, bed mobility, dressing, hygiene/grooming, and locomotion level  PARTICIPATION LIMITATIONS: meal prep, cleaning, driving, shopping, and community activity  PERSONAL FACTORS: Age, Fitness, Past/current experiences, and Time since onset of injury/illness/exacerbation are also affecting patient's functional outcome.   REHAB POTENTIAL: Good  CLINICAL DECISION MAKING: Evolving/moderate complexity  EVALUATION COMPLEXITY: Moderate   GOALS: Goals reviewed with patient? Yes  SHORT TERM GOALS: Target date: 05/13/2024  Pt will be ind with initial HEP Baseline:  Goal status: MET Pt reports adherence 05/15/24  2.  Pt will have improved hip flexion to at least 110 deg AROM Baseline:  Goal status: MET (in supine, and seated AAROM)  3.  Pt will report improved medial thigh/knee pain by >/=25% Baseline:  Goal status: MET 05/15/24: Pt reports max pain at 6/10   LONG TERM GOALS: Target date: 07/21/2024   Pt will be ind with management and progression of HEP Baseline:  Goal status: MET 05/20/2024  2.  Pt will be able to amb at least 400' with LRAD for increased home and limited community mobility Baseline:  Goal status: MET  05/20/2024 (walked around 500') 07/08/24= 600'  3.  Pt will have improved 5x STS to </=20 sec to reduce her fall risk and increase functional LE strength Baseline: 35.6 c use of hands to RW Goal status: ONGOING  4.  Pt will have improved R knee ext to within at least 10 deg of full extension to demo increased quad strength Baseline: 32 deg away from full ext 07/08/24: 30d from full ext Goal status: ONGOING  5.  Pt will have improved LEFS score to >/=42/80 to demo MCID Baseline: 33/80 05/20/2024: 53/80 Goal status: MET 05/20/2024  6. Pt will improve hip flexion to 110d AROM in seated   Baseline: 110d AAROM  07/08/24: 90d  Goal status:ONGOING  7.  Pt will independently ambulate 500' with LRAD within 6 minutes to demonstrate improved weightbearing tolerance, BLE strength, and functional capacity for community ambulation.  Baseline: - 300' FWW  07/08/24: 300' FWW  Goal status: ONGOING   PLAN:  PT FREQUENCY: 2x/week  PT DURATION: 8 weeks  PLANNED INTERVENTIONS: 97164- PT Re-evaluation, 97750- Physical Performance Testing, 97110-Therapeutic exercises, 97530- Therapeutic activity, [id number]- Neuromuscular re-education, 97535- Self Care, [id number]- Manual therapy, [phone]- Gait training, [phone]- Aquatic Therapy, [phone]- Electrical stimulation (unattended), 97016- Vasopneumatic device, [id number]- Ultrasound, [id number]- Ionotophoresis 4mg /ml Dexamethasone , 79439 (1-2 muscles), [id number] (3+ muscles)- Dry Needling, Patient/Family education, Balance training, Stair training, Taping, Joint mobilization, Spinal mobilization, Vestibular training, Cryotherapy, and Moist heat  PLAN FOR NEXT SESSION: Continue with therapeutic focus on RLE strengthening, quadriceps recruitment,ambulation quality, and transfer quality.   Shanda Code, SPTA 07/13/24 9:28 AM

## 2024-07-15 ENCOUNTER — Ambulatory Visit

## 2024-07-15 NOTE — Therapy (Incomplete)
 OUTPATIENT PHYSICAL THERAPY TREATMENT   Patient Name: Renee Watts MRN: [id number] DOB:18-Jul-1951, 73 y.o., female Today's Date: 07/15/2024  END OF SESSION:    Past Medical History:  Diagnosis Date   Allergy    Benign paroxysmal positional vertigo 06/25/2013   dx with prior PCP, s/p labs, mri and vestibular rehab; nasal spray for etd seemed to help   Blood in stool 03/11/2012   Dependent edema    Bilateral   Depression    GERD (gastroesophageal reflux disease)    on meds   Hyperlipidemia    on meds   Low back pain    sciatica; sees orthopedic specialist ([facility])for this and shoulder tendinitis   Obesity, diabetes, and hypertension syndrome (HCC)    on med   Osteoarthritis, knee    Bilateral, s/p 3 total knee replacement surgerues on right, last 2005. Dr. Duwayne   Pellegrini-Steida syndrome    Myositis ossificans   Seasonal allergies    Shingles 07/10/2012   LESIONS CLEARED BUT NOT TOLERATING ANY THING TIGHT AGAINST BODY--SHINGLES WERE AROSS BACK AND ABDOMEN   Past Surgical History:  Procedure Laterality Date   ABDOMINAL HYSTERECTOMY  1984   BLADDER SURGERY  03/2017   bladder tac per pt   COLONOSCOPY  2020   MS-MAC-goly (good)-hems/TAx5)   left shoulder steroid injection     ORIF FEMUR FRACTURE Right 12/06/2023   Procedure: OPEN REDUCTION INTERNAL FIXATION (ORIF) DISTAL FEMUR FRACTURE;  Surgeon: Kendal Franky SQUIBB, MD;  Location: [facility];  Service: Orthopedics;  Laterality: Right;   POLYPECTOMY  2020   TA x 5   TONSILLECTOMY  1977   TOTAL HIP ARTHROPLASTY Right 10/10/2012   Procedure: R TOTAL HIP ARTHROPLASTY ANTERIOR APPROACH;  Surgeon: Lonni CINDERELLA Poli, MD;  Location: [facility];  Service: Orthopedics;  Laterality: Right;  Right Total Hip Arthroplasty, Anterior Approach (C-Arm)   TOTAL KNEE ARTHROPLASTY Right    with multiple revisions, 2003, 2005   TOTAL KNEE ARTHROPLASTY Left 01/09/2013   Procedure: LEFT TOTAL KNEE ARTHROPLASTY;  Surgeon: Lonni CINDERELLA Poli, MD;  Location: [facility];  Service: Orthopedics;  Laterality: Left;   TUBAL LIGATION  1978   WISDOM TOOTH EXTRACTION  2013,2014   Patient Active Problem List   Diagnosis Date Noted   Closed fracture of distal end of right femur, unspecified fracture morphology, initial encounter (HCC) 12/05/2023   Lumbar radiculopathy 09/17/2023   Osteopenia 02/15/2021   Venous stasis of both lower extremities 06/27/2020   Bilateral lower extremity edema 02/05/2020   Recurrent sinusitis 09/03/2018   Vaginal vault 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  Rationale for Evaluation and Treatment: Rehabilitation  ONSET DATE: 12/06/23  SUBJECTIVE:   SUBJECTIVE STATEMENT: ***  Patient reports that she is having some stiffness in her right leg, and was sore after her last session. Patient has been compliant with her HEP.  From eval: Pt states she has been doing home health PT since her 12/06/23 hip surgery. Was d/c-ed ~  1 month ago. Was able to walk 500 feet with PT. Pt reports pain is more in the inside of her medial thigh. No pain where the incision is laterally. Has been using quad cane intermittently in the house. Pt is fearful of another fall. Pt notes she gets fatigued with prolonged standing but not limited by pain.   PERTINENT HISTORY: L3/4 laminectomy and PSIF on 09/17/23; neuropathy in legs, bilat knee replacement, R hip replacement    PAIN:  Are you having pain? Yes: NPRS scale: 6 currently for thigh muscles; at worst 10/10 Pain location: R medial thigh Pain description: sometimes sharp or achy Aggravating factors: Rolling in bed, stepping wrong, getting up quickly Relieving factors: hydrocodone , ice/heat  PRECAUTIONS: Fall  RED FLAGS: None   WEIGHT BEARING RESTRICTIONS: WBAT with no ROM restrictions per 12/06/23 surgery note  FALLS:  Has patient fallen in last 6 months? Yes. Number of falls 1  -- 12/05/23 resulting in her femur fracture  LIVING ENVIRONMENT: Lives with: lives with their spouse; children and grandchildren are in and out Lives in: House/apartment Stairs: 1 step up to enter Has following equipment at home: Single point cane, Quad cane small base, Walker - 2 wheeled, Grab bars, and walk in shower  OCCUPATION: Retired - loves to the [employer] and entertainment  PLOF: Independent  PATIENT GOALS: Return to cooking and normal activities without having to stop and rest  NEXT MD VISIT: 06/04/24 to see PCP  OBJECTIVE:  Note: Objective measures were completed at Evaluation unless otherwise noted.  DIAGNOSTIC FINDINGS: X-rays of the lumbar spine from 03/09/2024 were independently reviewed and interpreted, showing laminectomy defect at L3/4.  Spondylolisthesis at L3/4 that does not shift between flexion and extension views.  Posterior instrumentation at L3 and L4.  No lucency seen around the screws and none of them have backed out.  No fracture or dislocation seen.   PATIENT SURVEYS:  Lower Extremity Functional Score: 33 / 80 = 41.3 %  05/20/2024: 53/80    SENSATION: Neuropathy in bilat LEs -- knee to ankle  EDEMA:  Will swell if legs are down  MUSCLE LENGTH: Hamstrings: See knee AROM below  POSTURE: flexed trunk , weight shift left, and knees flexed  PALPATION: TTP medial knee/adductors  LOWER EXTREMITY ROM:  Active ROM Right eval Left eval Right 05/01/24 Left 05/01/24  Hip flexion sitting  95 PROM WNL in sup At least 110 deg 95 supine 110 supine  Hip extension      Hip abduction      Hip adduction      Hip internal rotation      Hip external rotation      Knee flexion sitting 80 110 85 110  Knee extension sitting -32 PROM can get -10 in sup -10 -9 -6  Ankle dorsiflexion      Ankle plantarflexion      Ankle inversion      Ankle eversion       (Blank rows = not tested)  LOWER EXTREMITY MMT:   MMT Right eval Left eval  Hip flexion 3- 3+  Hip extension    Hip abduction supine 3+ 3+  Hip adduction    Hip internal rotation    Hip external rotation    Knee flexion 4 4  Knee extension 3- 3+  Ankle dorsiflexion    Ankle plantarflexion    Ankle inversion    Ankle eversion     (Blank rows = not tested)  LOWER EXTREMITY SPECIAL TESTS:  Hip special  tests: [facility]) test: positive  and Thomas test: positive   FUNCTIONAL TESTS:  5 times sit to stand: with UE support 49.86 sec 05/20/2024: 50m, UE support from mat, height parallel with arm chair : 300' w/ FWW  GAIT: Distance walked: Into clinic Assistive device utilized: Environmental Consultant - 2 wheeled Level of assistance: Modified independence Comments: Diminished swing phase on R, bilat steppage gait, diminished heel/toe on R, flexed trunk and knees  [facility] Adult PT Treatment:                                                DATE: 07/15/24 Therapeutic Exercise: Nustep level 5 x 5 mins Heel slides with strap 2x10 SLR 2x10 with strap Seated hamstring curls RTB 2x10 Seated LAQ 2x10 Supine hip abduction x10 GTB Therapeutic Activity: STS x5 Seated marches 3x15  [facility] Adult PT Treatment:                                                DATE: 07/13/24 Therapeutic Exercise: Nustep level 5 x 5 mins Heel slides with strap 2x10 SLR 2x10 with strap Seated hamstring curls RTB 2x10 Seated LAQ 2x10 Supine hip abduction x10 GTB Therapeutic Activity: STS x5 Seated marches 3x15 Sitting lean forward with hands on yoga  block and pushing through heels to lift butt 1 above bed x10  [facility] Adult PT Treatment:                                                DATE: 07/08/24 Therapeutic Exercise: Sitting LAQ eccentrics R only x15, strap assist to complete full concentric ext and eccentric lowering as needed Heel slide x15 c use of strap at EOM for stretch SLR c quad set x15, strap for concentric assist and eccentric lowering as needed Bridge 2x10 partial Therapeutic Activity: Nustep x8 mins L5 UE/LE 5xSTS 6 MWT STS x10 c use of hands Standing knee lifts 2x10 each Standing TKE 2x12 RTB   PATIENT EDUCATION:  Education details: Exam findings, POC, initial HEP Person educated: Patient Education method: Explanation, Demonstration, and Handouts Education comprehension: verbalized understanding, returned demonstration, and needs further education  HOME EXERCISE PROGRAM: Access Code: [email or web address] URL: [email or web address] Date: 04/23/2024 Prepared by: Gellen April Marie Nonato  Exercises - Seated Heel Slide  - 1 x daily - 7 x weekly - 2 sets - 10 reps - Seated Quad Set  - 1 x daily - 7 x weekly - 2 sets - 10 reps - 3 sec hold - Seated Hip Abduction with Resistance  - 2 x daily - 7 x weekly - 1 sets - 10 reps - 3 sec hold - Seated Hip Flexion Isometric  - 2 x daily - 7 x weekly - 1 sets - 10 reps - 3 sec hold - Seated Active Hip Flexion  - 2 x daily - 7 x weekly - 1 sets - 10 reps - Beginner Bridge  - 2 x daily - 7 x weekly - 1 sets - 10 reps  ASSESSMENT:  CLINICAL IMPRESSION: ***  Patient presents to PT reporting that she's had some stiffness  in her RLE. She states that she has been doing her exercises at home and they seem to be helping. Today's session focused on strengthening the RLE and increasing mobility. Patient tolerated all exercises well, continues to have some difficulty with hip flexion strength. Patient needs longer time to complete sit to stand transfer with UE support, and weight  shifts onto LLE. Patient will benefit from skilled PT to improve functional abilities and mobility.  Progress note 05/20/2024: Pt attended physical therapy session for re-evaluation of R hip ORIF. Pt is progressing well and has met 6 goals, pt continues to work towards 2 other initial goals. Difficulties continue with RLE strength, and transfer quality necessary for a safe return to independent lifestyle inline with PLOF. 2 additional goals were added to further progress quality of AROM and ambulation quality necessary for return to higher level community participation. Pt required minimal cuing as well as no assistance for safe and appropriate performance of today's activities. Continue with therapeutic focus on RLE strengthening, quadriceps recruitment,ambulation quality, and transfer quality. Education was given to continue applying ADL education from previous sessions as well as performing HEP as prescribed with freedom to progress as tolerated using previous education on modification and exercise dosage. Pt has displayed and verbalized competence regarding this education.   Pt continues to require the intervention of skilled outpatient physical therapy to address the aforementioned deficits and progress towards a functional level in line with therapeutic goals.  OBJECTIVE IMPAIRMENTS: Abnormal gait, decreased activity tolerance, decreased balance, decreased coordination, decreased endurance, decreased mobility, difficulty walking, decreased ROM, decreased strength, hypomobility, increased fascial restrictions, impaired flexibility, improper body mechanics, postural dysfunction, and pain.   ACTIVITY LIMITATIONS: carrying, lifting, bending, sitting, standing, squatting, sleeping, stairs, transfers, bed mobility, dressing, hygiene/grooming, and locomotion level  PARTICIPATION LIMITATIONS: meal prep, cleaning, driving, shopping, and community activity  PERSONAL FACTORS: Age, Fitness, Past/current  experiences, and Time since onset of injury/illness/exacerbation are also affecting patient's functional outcome.   REHAB POTENTIAL: Good  CLINICAL DECISION MAKING: Evolving/moderate complexity  EVALUATION COMPLEXITY: Moderate   GOALS: Goals reviewed with patient? Yes  SHORT TERM GOALS: Target date: 05/13/2024  Pt will be ind with initial HEP Baseline: Goal status: MET Pt reports adherence 05/15/24  2.  Pt will have improved hip flexion to at least 110 deg AROM Baseline:  Goal status: MET (in supine, and seated AAROM)  3.  Pt will report improved medial thigh/knee pain by >/=25% Baseline:  Goal status: MET 05/15/24: Pt reports max pain at 6/10   LONG TERM GOALS: Target date: 07/21/2024   Pt will be ind with management and progression of HEP Baseline:  Goal status: MET 05/20/2024  2.  Pt will be able to amb at least 400' with LRAD for increased home and limited community mobility Baseline:  Goal status: MET 05/20/2024 (walked around 500') 07/08/24= 600'  3.  Pt will have improved 5x STS to </=20 sec to reduce her fall risk and increase functional LE strength Baseline: 35.6 c use of hands to RW Goal status: ONGOING  4.  Pt will have improved R knee ext to within at least 10 deg of full extension to demo increased quad strength Baseline: 32 deg away from full ext 07/08/24: 30d from full ext Goal status: ONGOING  5.  Pt will have improved LEFS score to >/=42/80 to demo MCID Baseline: 33/80 05/20/2024: 53/80 Goal status: MET 05/20/2024  6. Pt will improve hip flexion to 110d AROM in seated   Baseline: 110d AAROM  07/08/24: 90d  Goal status:ONGOING  7.  Pt will independently ambulate 500' with LRAD within 6 minutes to demonstrate improved weightbearing tolerance, BLE strength, and functional capacity for community ambulation.  Baseline: - 300' FWW  07/08/24: 300' FWW  Goal status: ONGOING   PLAN:  PT FREQUENCY: 2x/week  PT DURATION: 8 weeks  PLANNED  INTERVENTIONS: 97164- PT Re-evaluation, 97750- Physical Performance Testing, 97110-Therapeutic exercises, 97530- Therapeutic activity, [id number]- Neuromuscular re-education, 97535- Self Care, [id number]- Manual therapy, [phone]- Gait training, [phone]- Aquatic Therapy, [phone]- Electrical stimulation (unattended), 97016- Vasopneumatic device, [id number]- Ultrasound, [id number]- Ionotophoresis 4mg /ml Dexamethasone , 79439 (1-2 muscles), [id number] (3+ muscles)- Dry Needling, Patient/Family education, Balance training, Stair training, Taping, Joint mobilization, Spinal mobilization, Vestibular training, Cryotherapy, and Moist heat  PLAN FOR NEXT SESSION: Continue with therapeutic focus on RLE strengthening, quadriceps recruitment,ambulation quality, and transfer quality.   Corean Pouch PTA 07/15/24 8:02 AM

## 2024-07-20 ENCOUNTER — Ambulatory Visit

## 2024-07-20 DIAGNOSIS — R2689 Other abnormalities of gait and mobility: Secondary | ICD-10-CM

## 2024-07-20 DIAGNOSIS — M6281 Muscle weakness (generalized): Secondary | ICD-10-CM

## 2024-07-20 DIAGNOSIS — M79604 Pain in right leg: Secondary | ICD-10-CM | POA: Diagnosis not present

## 2024-07-20 NOTE — Therapy (Signed)
 OUTPATIENT PHYSICAL THERAPY TREATMENT   Patient Name: Renee Watts MRN: [id number] DOB:14-Jun-1951, 73 y.o., female Today's Date: 07/20/2024  END OF SESSION:  PT End of Session - 07/20/24 0838     Visit Number 22    Date for Recertification  09/11/24    Authorization Type [employer]    PT Start Time 0830    PT Stop Time 0910    PT Time Calculation (min) 40 min    Activity Tolerance Patient tolerated treatment well    Behavior During Therapy [facility] for tasks assessed/performed           Past Medical History:  Diagnosis Date   Allergy    Benign paroxysmal positional vertigo 06/25/2013   dx with prior PCP, s/p labs, mri and vestibular rehab; nasal spray for etd seemed to help   Blood in stool 03/11/2012   Dependent edema    Bilateral   Depression    GERD (gastroesophageal reflux disease)    on meds   Hyperlipidemia    on meds   Low back pain    sciatica; sees orthopedic specialist ([facility])for this and shoulder tendinitis   Obesity, diabetes, and hypertension syndrome (HCC)    on med   Osteoarthritis, knee    Bilateral, s/p 3 total knee replacement surgerues on right, last 2005. Dr. Duwayne   Pellegrini-Steida syndrome    Myositis ossificans   Seasonal allergies    Shingles 07/10/2012   LESIONS CLEARED BUT NOT TOLERATING ANY THING TIGHT AGAINST BODY--SHINGLES WERE AROSS BACK AND ABDOMEN   Past Surgical History:  Procedure Laterality Date   ABDOMINAL HYSTERECTOMY  1984   BLADDER SURGERY  03/2017   bladder tac per pt   COLONOSCOPY  2020   MS-MAC-goly (good)-hems/TAx5)   left shoulder steroid injection     ORIF FEMUR FRACTURE Right 12/06/2023   Procedure: OPEN REDUCTION INTERNAL FIXATION (ORIF) DISTAL FEMUR FRACTURE;  Surgeon: Kendal Franky SQUIBB, MD;  Location: [facility] OR;  Service: Orthopedics;  Laterality: Right;   POLYPECTOMY  2020   TA x 5   TONSILLECTOMY  1977   TOTAL HIP ARTHROPLASTY Right 10/10/2012   Procedure: R TOTAL HIP ARTHROPLASTY ANTERIOR  APPROACH;  Surgeon: Lonni CINDERELLA Poli, MD;  Location: [facility] ORS;  Service: Orthopedics;  Laterality: Right;  Right Total Hip Arthroplasty, Anterior Approach (C-Arm)   TOTAL KNEE ARTHROPLASTY Right    with multiple revisions, 2003, 2005   TOTAL KNEE ARTHROPLASTY Left 01/09/2013   Procedure: LEFT TOTAL KNEE ARTHROPLASTY;  Surgeon: Lonni CINDERELLA Poli, MD;  Location: [facility];  Service: Orthopedics;  Laterality: Left;   TUBAL LIGATION  1978   WISDOM TOOTH EXTRACTION  2013,2014   Patient Active Problem List   Diagnosis Date Noted   Closed fracture of distal end of right femur, unspecified fracture morphology, initial encounter (HCC) 12/05/2023   Lumbar radiculopathy 09/17/2023   Osteopenia 02/15/2021   Venous stasis of both lower extremities 06/27/2020   Bilateral lower extremity edema 02/05/2020   Recurrent sinusitis 09/03/2018   Vaginal vault prolapse after hysterectomy 02/13/2017   Obesity 01/06/2016   Low back pain 01/06/2016   Normocytic anemia 03/12/2013   Diabetic peripheral neuropathy (HCC) 09/01/2012   Hyperlipemia 09/16/2006   Hypertension associated with diabetes (HCC) 09/16/2006   GERD 09/16/2006   Type II diabetes mellitus with neurological manifestations (HCC) 06/17/2006   Osteoarthritis 06/17/2006   DEPENDENT EDEMA, LEGS, BILATERAL 06/17/2006    PCP: Mercer Clotilda SAUNDERS, MD  REFERRING PROVIDER: Kendal Franky SQUIBB, MD  REFERRING DIAG:  ORIF of right distal fracture performed on 12/06/23  THERAPY DIAG:  Pain in right leg  Muscle weakness (generalized)  Other abnormalities of gait and mobility  Rationale for Evaluation and Treatment: Rehabilitation  ONSET DATE: 12/06/23  SUBJECTIVE:   SUBJECTIVE STATEMENT: Patient reports that she is feeling better than last week. She reports that her legs are a little sore today. She states that she feels like she isn't progressing like she should be.  From eval: Pt states she has been doing home health PT since her 12/06/23 hip  surgery. Was d/c-ed ~1 month ago. Was able to walk 500 feet with PT. Pt reports pain is more in the inside of her medial thigh. No pain where the incision is laterally. Has been using quad cane intermittently in the house. Pt is fearful of another fall. Pt notes she gets fatigued with prolonged standing but not limited by pain.   PERTINENT HISTORY: L3/4 laminectomy and PSIF on 09/17/23; neuropathy in legs, bilat knee replacement, R hip replacement   PAIN:  Are you having pain? Yes: NPRS scale: 6 currently for thigh muscles; at worst 10/10 Pain location: R medial thigh Pain description: sometimes sharp or achy Aggravating factors: Rolling in bed, stepping wrong, getting up quickly Relieving factors: hydrocodone , ice/heat  PRECAUTIONS: Fall  RED FLAGS: None   WEIGHT BEARING RESTRICTIONS: WBAT with no ROM restrictions per 12/06/23 surgery note  FALLS:  Has patient fallen in last 6 months? Yes. Number of falls 1  -- 12/05/23 resulting in her femur fracture  LIVING ENVIRONMENT: Lives with: lives with their spouse; children and grandchildren are in and out Lives in: House/apartment Stairs: 1 step up to enter Has following equipment at home: Single point cane, Quad cane small base, Walker - 2 wheeled, Grab bars, and walk in shower  OCCUPATION: Retired - loves to the [employer] and entertainment  PLOF: Independent  PATIENT GOALS: Return to cooking and normal activities without having to stop and rest  NEXT MD VISIT: 06/04/24 to see PCP  OBJECTIVE:  Note: Objective measures were completed at Evaluation unless otherwise noted.  DIAGNOSTIC FINDINGS: X-rays of the lumbar spine from 03/09/2024 were independently reviewed and interpreted, showing laminectomy defect at L3/4.  Spondylolisthesis at L3/4 that does not shift between flexion and extension views.  Posterior instrumentation at L3 and L4.  No lucency seen around the screws and none of them have backed out.  No fracture or dislocation seen.    PATIENT SURVEYS:  Lower Extremity Functional Score: 33 / 80 = 41.3 %  05/20/2024: 53/80    SENSATION: Neuropathy in bilat LEs -- knee to ankle  EDEMA:  Will swell if legs are down  MUSCLE LENGTH: Hamstrings: See knee AROM below  POSTURE: flexed trunk , weight shift left, and knees flexed  PALPATION: TTP medial knee/adductors  LOWER EXTREMITY ROM:  Active ROM Right eval Left eval Right 05/01/24 Left 05/01/24  Hip flexion sitting 95 PROM WNL in sup At least 110 deg 95 supine 110 supine  Hip extension      Hip abduction      Hip adduction      Hip internal rotation      Hip external rotation      Knee flexion sitting 80 110 85 110  Knee extension sitting -32 PROM can get -10 in sup -10 -9 -6  Ankle dorsiflexion      Ankle plantarflexion      Ankle inversion      Ankle eversion       (  Blank rows = not tested)  LOWER EXTREMITY MMT:   MMT Right eval Left eval  Hip flexion 3- 3+  Hip extension    Hip abduction supine 3+ 3+  Hip adduction    Hip internal rotation    Hip external rotation    Knee flexion 4 4  Knee extension 3- 3+  Ankle dorsiflexion    Ankle plantarflexion    Ankle inversion    Ankle eversion     (Blank rows = not tested)  LOWER EXTREMITY SPECIAL TESTS:  Hip special tests: Belvie (FABER) test: positive  and Thomas test: positive   FUNCTIONAL TESTS:  5 times sit to stand: with UE support 49.86 sec 05/20/2024: 59m, UE support from mat, height parallel with arm chair : 300' w/ FWW  GAIT: Distance walked: Into clinic Assistive device utilized: Environmental Consultant - 2 wheeled Level of assistance: Modified independence Comments: Diminished swing phase on R, bilat steppage gait, diminished heel/toe on R, flexed trunk and knees  [facility] Adult PT Treatment:                                                DATE: 07/20/24 Therapeutic Exercise: Nustep level 5 x 5 mins SLR RLE x10 with strap Seated hamstring curls RTB x10, GTB x15 Seated LAQ 2x15  BIL Supine hip abduction x10 GTB Therapeutic Activity: Seated marches 2x20 Heel raises x10  [facility] Adult PT Treatment:                                                DATE: 07/13/24 Therapeutic Exercise: Nustep level 5 x 5 mins Heel slides with strap 2x10 SLR 2x10 with strap Seated hamstring curls RTB 2x10 Seated LAQ 2x10 Supine hip abduction x10 GTB Therapeutic Activity: STS x5 Seated marches 3x15 Sitting lean forward with hands on yoga block and pushing through heels to lift butt 1 above bed x10  [facility] Adult PT Treatment:                                                DATE: 07/08/24 Therapeutic Exercise: Sitting LAQ eccentrics R only x15, strap assist to complete full concentric ext and eccentric lowering as needed Heel slide x15 c use of strap at EOM for stretch SLR c quad set x15, strap for concentric assist and eccentric lowering as needed Bridge 2x10 partial Therapeutic Activity: Nustep x8 mins L5 UE/LE 5xSTS 6 MWT STS x10 c use of hands Standing knee lifts 2x10 each Standing TKE 2x12 RTB   PATIENT EDUCATION:  Education details: Exam findings, POC, initial HEP Person educated: Patient Education method: Explanation, Demonstration, and Handouts Education comprehension: verbalized understanding, returned demonstration, and needs further education  HOME EXERCISE PROGRAM: Access Code: [email or web address] URL: [email or web address] Date: 04/23/2024 Prepared by: Gellen April Marie Nonato  Exercises - Seated Heel Slide  - 1 x daily - 7 x weekly - 2 sets - 10 reps - Seated Quad Set  - 1 x daily - 7 x weekly - 2 sets - 10 reps - 3 sec hold - Seated Hip Abduction with Resistance  - 2 x  daily - 7 x weekly - 1 sets - 10 reps - 3 sec hold - Seated Hip Flexion Isometric  - 2 x daily - 7 x weekly - 1 sets - 10 reps - 3 sec hold - Seated Active Hip Flexion  - 2 x daily - 7 x weekly - 1 sets - 10 reps - Beginner Bridge  - 2 x daily - 7 x weekly - 1 sets - 10  reps  ASSESSMENT:  CLINICAL IMPRESSION: Patient presents to PT reporting that she has some soreness in her legs but feels better this week than last week. She states that she has been trying to put more weight through her RLE while walking. Today's session focused on strengthening of the RLE. Patient tolerated all exercises well, she expresses concern that she thinks she isn't progressing like she should, but very motivated. Patient will benefit from skilled PT in order to improve functional independence and mobility.   Progress note 05/20/2024: Pt attended physical therapy session for re-evaluation of R hip ORIF. Pt is progressing well and has met 6 goals, pt continues to work towards 2 other initial goals. Difficulties continue with RLE strength, and transfer quality necessary for a safe return to independent lifestyle inline with PLOF. 2 additional goals were added to further progress quality of AROM and ambulation quality necessary for return to higher level community participation. Pt required minimal cuing as well as no assistance for safe and appropriate performance of today's activities. Continue with therapeutic focus on RLE strengthening, quadriceps recruitment,ambulation quality, and transfer quality. Education was given to continue applying ADL education from previous sessions as well as performing HEP as prescribed with freedom to progress as tolerated using previous education on modification and exercise dosage. Pt has displayed and verbalized competence regarding this education.   Pt continues to require the intervention of skilled outpatient physical therapy to address the aforementioned deficits and progress towards a functional level in line with therapeutic goals.  OBJECTIVE IMPAIRMENTS: Abnormal gait, decreased activity tolerance, decreased balance, decreased coordination, decreased endurance, decreased mobility, difficulty walking, decreased ROM, decreased strength, hypomobility,  increased fascial restrictions, impaired flexibility, improper body mechanics, postural dysfunction, and pain.   ACTIVITY LIMITATIONS: carrying, lifting, bending, sitting, standing, squatting, sleeping, stairs, transfers, bed mobility, dressing, hygiene/grooming, and locomotion level  PARTICIPATION LIMITATIONS: meal prep, cleaning, driving, shopping, and community activity  PERSONAL FACTORS: Age, Fitness, Past/current experiences, and Time since onset of injury/illness/exacerbation are also affecting patient's functional outcome.   REHAB POTENTIAL: Good  CLINICAL DECISION MAKING: Evolving/moderate complexity  EVALUATION COMPLEXITY: Moderate   GOALS: Goals reviewed with patient? Yes  SHORT TERM GOALS: Target date: 05/13/2024  Pt will be ind with initial HEP Baseline: Goal status: MET Pt reports adherence 05/15/24  2.  Pt will have improved hip flexion to at least 110 deg AROM Baseline:  Goal status: MET (in supine, and seated AAROM)  3.  Pt will report improved medial thigh/knee pain by >/=25% Baseline:  Goal status: MET 05/15/24: Pt reports max pain at 6/10   LONG TERM GOALS: Target date: 07/21/2024   Pt will be ind with management and progression of HEP Baseline:  Goal status: MET 05/20/2024  2.  Pt will be able to amb at least 400' with LRAD for increased home and limited community mobility Baseline:  Goal status: MET 05/20/2024 (walked around 500') 07/08/24= 600'  3.  Pt will have improved 5x STS to </=20 sec to reduce her fall risk and increase functional LE strength Baseline: 35.6  c use of hands to RW Goal status: ONGOING  4.  Pt will have improved R knee ext to within at least 10 deg of full extension to demo increased quad strength Baseline: 32 deg away from full ext 07/08/24: 30d from full ext Goal status: ONGOING  5.  Pt will have improved LEFS score to >/=42/80 to demo MCID Baseline: 33/80 05/20/2024: 53/80 Goal status: MET 05/20/2024  6. Pt will  improve hip flexion to 110d AROM in seated   Baseline: 110d AAROM  07/08/24: 90d  Goal status:ONGOING  7.  Pt will independently ambulate 500' with LRAD within 6 minutes to demonstrate improved weightbearing tolerance, BLE strength, and functional capacity for community ambulation.  Baseline: - 300' FWW  07/08/24: 300' FWW  Goal status: ONGOING   PLAN:  PT FREQUENCY: 2x/week  PT DURATION: 8 weeks  PLANNED INTERVENTIONS: 97164- PT Re-evaluation, 97750- Physical Performance Testing, 97110-Therapeutic exercises, 97530- Therapeutic activity, [id number]- Neuromuscular re-education, 97535- Self Care, [id number]- Manual therapy, [phone]- Gait training, [phone]- Aquatic Therapy, [phone]- Electrical stimulation (unattended), 97016- Vasopneumatic device, [id number]- Ultrasound, [id number]- Ionotophoresis 4mg /ml Dexamethasone , 79439 (1-2 muscles), [id number] (3+ muscles)- Dry Needling, Patient/Family education, Balance training, Stair training, Taping, Joint mobilization, Spinal mobilization, Vestibular training, Cryotherapy, and Moist heat  PLAN FOR NEXT SESSION: Continue with therapeutic focus on RLE strengthening, quadriceps recruitment,ambulation quality, and transfer quality.   Shanda Code, SPTA 07/20/24 9:41 AM

## 2024-07-22 ENCOUNTER — Encounter: Payer: Self-pay | Admitting: Physical Therapy

## 2024-07-22 ENCOUNTER — Ambulatory Visit: Admitting: Physical Therapy

## 2024-07-22 DIAGNOSIS — M6281 Muscle weakness (generalized): Secondary | ICD-10-CM

## 2024-07-22 DIAGNOSIS — M79604 Pain in right leg: Secondary | ICD-10-CM | POA: Diagnosis not present

## 2024-07-22 NOTE — Therapy (Signed)
 OUTPATIENT PHYSICAL THERAPY TREATMENT   Patient Name: Renee Watts MRN: [id number] DOB:[birth year], 73 y.o., female Today's Date: 07/22/2024  END OF SESSION:  PT End of Session - 07/22/24 0940     Visit Number 23    Number of Visits 32    Date for Recertification  09/11/24    Authorization Type [employer]    PT Start Time 0930    PT Stop Time 1015    PT Time Calculation (min) 45 min           Past Medical History:  Diagnosis Date   Allergy    Benign paroxysmal positional vertigo 06/25/2013   dx with prior PCP, s/p labs, mri and vestibular rehab; nasal spray for etd seemed to help   Blood in stool 03/11/2012   Dependent edema    Bilateral   Depression    GERD (gastroesophageal reflux disease)    on meds   Hyperlipidemia    on meds   Low back pain    sciatica; sees orthopedic specialist ([facility])for this and shoulder tendinitis   Obesity, diabetes, and hypertension syndrome (HCC)    on med   Osteoarthritis, knee    Bilateral, s/p 3 total knee replacement surgerues on right, last 2005. Dr. Duwayne   Pellegrini-Steida syndrome    Myositis ossificans   Seasonal allergies    Shingles 07/10/2012   LESIONS CLEARED BUT NOT TOLERATING ANY THING TIGHT AGAINST BODY--SHINGLES WERE AROSS BACK AND ABDOMEN   Past Surgical History:  Procedure Laterality Date   ABDOMINAL HYSTERECTOMY  1984   BLADDER SURGERY  03/2017   bladder tac per pt   COLONOSCOPY  2020   MS-MAC-goly (good)-hems/TAx5)   left shoulder steroid injection     ORIF FEMUR FRACTURE Right 12/06/2023   Procedure: OPEN REDUCTION INTERNAL FIXATION (ORIF) DISTAL FEMUR FRACTURE;  Surgeon: Kendal Franky SQUIBB, MD;  Location: [facility] OR;  Service: Orthopedics;  Laterality: Right;   POLYPECTOMY  2020   TA x 5   TONSILLECTOMY  1977   TOTAL HIP ARTHROPLASTY Right 10/10/2012   Procedure: R TOTAL HIP ARTHROPLASTY ANTERIOR APPROACH;  Surgeon: Lonni CINDERELLA Poli, MD;  Location: [facility];  Service: Orthopedics;   Laterality: Right;  Right Total Hip Arthroplasty, Anterior Approach (C-Arm)   TOTAL KNEE ARTHROPLASTY Right    with multiple revisions, 2003, 2005   TOTAL KNEE ARTHROPLASTY Left 01/09/2013   Procedure: LEFT TOTAL KNEE ARTHROPLASTY;  Surgeon: Lonni CINDERELLA Poli, MD;  Location: [facility];  Service: Orthopedics;  Laterality: Left;   TUBAL LIGATION  1978   WISDOM TOOTH EXTRACTION  2013,2014   Patient Active Problem List   Diagnosis Date Noted   Closed fracture of distal end of right femur, unspecified fracture morphology, initial encounter (HCC) 12/05/2023   Lumbar radiculopathy 09/17/2023   Osteopenia 02/15/2021   Venous stasis of both lower extremities 06/27/2020   Bilateral lower extremity edema 02/05/2020   Recurrent sinusitis 09/03/2018   Vaginal vault prolapse after hysterectomy 02/13/2017   Obesity 01/06/2016   Low back pain 01/06/2016   Normocytic anemia 03/12/2013   Diabetic peripheral neuropathy (HCC) 09/01/2012   Hyperlipemia 09/16/2006   Hypertension associated with diabetes (HCC) 09/16/2006   GERD 09/16/2006   Type II diabetes mellitus with neurological manifestations (HCC) 06/17/2006   Osteoarthritis 06/17/2006   DEPENDENT EDEMA, LEGS, BILATERAL 06/17/2006    PCP: Mercer Clotilda SAUNDERS, MD  REFERRING PROVIDER: Kendal Franky SQUIBB, MD  REFERRING DIAG: ORIF of right distal fracture performed on 12/06/23  THERAPY DIAG:  Pain in right leg  Muscle weakness (generalized)  Rationale for Evaluation and Treatment: Rehabilitation  ONSET DATE: 12/06/23  SUBJECTIVE:   SUBJECTIVE STATEMENT: Patient reports she is sore from her exercises.   From eval: Pt states she has been doing home health PT since her 12/06/23 hip surgery. Was d/c-ed ~1 month ago. Was able to walk 500 feet with PT. Pt reports pain is more in the inside of her medial thigh. No pain where the incision is laterally. Has been using quad cane intermittently in the house. Pt is fearful of another fall. Pt notes she  gets fatigued with prolonged standing but not limited by pain.   PERTINENT HISTORY: L3/4 laminectomy and PSIF on 09/17/23; neuropathy in legs, bilat knee replacement, R hip replacement   PAIN:  Are you having pain? Yes: NPRS scale: 6 currently for thigh muscles; at worst 10/10 Pain location: R medial thigh Pain description: sometimes sharp or achy Aggravating factors: Rolling in bed, stepping wrong, getting up quickly Relieving factors: hydrocodone , ice/heat  PRECAUTIONS: Fall  RED FLAGS: None   WEIGHT BEARING RESTRICTIONS: WBAT with no ROM restrictions per 12/06/23 surgery note  FALLS:  Has patient fallen in last 6 months? Yes. Number of falls 1  -- 12/05/23 resulting in her femur fracture  LIVING ENVIRONMENT: Lives with: lives with their spouse; children and grandchildren are in and out Lives in: House/apartment Stairs: 1 step up to enter Has following equipment at home: Single point cane, Quad cane small base, Walker - 2 wheeled, Grab bars, and walk in shower  OCCUPATION: Retired - loves to the [employer] and entertainment  PLOF: Independent  PATIENT GOALS: Return to cooking and normal activities without having to stop and rest  NEXT MD VISIT: 06/04/24 to see PCP  OBJECTIVE:  Note: Objective measures were completed at Evaluation unless otherwise noted.  DIAGNOSTIC FINDINGS: X-rays of the lumbar spine from 03/09/2024 were independently reviewed and interpreted, showing laminectomy defect at L3/4.  Spondylolisthesis at L3/4 that does not shift between flexion and extension views.  Posterior instrumentation at L3 and L4.  No lucency seen around the screws and none of them have backed out.  No fracture or dislocation seen.   PATIENT SURVEYS:  Lower Extremity Functional Score: 33 / 80 = 41.3 %  05/20/2024: 53/80    SENSATION: Neuropathy in bilat LEs -- knee to ankle  EDEMA:  Will swell if legs are down  MUSCLE LENGTH: Hamstrings: See knee AROM below  POSTURE: flexed trunk , weight  shift left, and knees flexed  PALPATION: TTP medial knee/adductors  LOWER EXTREMITY ROM:  Active ROM Right eval Left eval Right 05/01/24 Left 05/01/24  Hip flexion sitting 95 PROM WNL in sup At least 110 deg 95 supine 110 supine  Hip extension      Hip abduction      Hip adduction      Hip internal rotation      Hip external rotation      Knee flexion sitting 80 110 85 110  Knee extension sitting -32 PROM can get -10 in sup -10 -9 -6  Ankle dorsiflexion      Ankle plantarflexion      Ankle inversion      Ankle eversion       (Blank rows = not tested)  LOWER EXTREMITY MMT:   MMT Right eval Left eval  Hip flexion 3- 3+  Hip extension    Hip abduction supine 3+ 3+  Hip adduction    Hip internal rotation  Hip external rotation    Knee flexion 4 4  Knee extension 3- 3+  Ankle dorsiflexion    Ankle plantarflexion    Ankle inversion    Ankle eversion     (Blank rows = not tested)  LOWER EXTREMITY SPECIAL TESTS:  Hip special tests: Belvie (FABER) test: positive  and Thomas test: positive   FUNCTIONAL TESTS:  5 times sit to stand: with UE support 49.86 sec 05/20/2024: 68m, UE support from mat, height parallel with arm chair : 300' w/ FWW  GAIT: Distance walked: Into clinic Assistive device utilized: Environmental Consultant - 2 wheeled Level of assistance: Modified independence Comments: Diminished swing phase on R, bilat steppage gait, diminished heel/toe on R, flexed trunk and knees  [facility] Adult PT Treatment:                                                DATE: 07/22/24 Therapeutic Exercise: Nustep L5 UE/LE x 5 minutes  Heel raise  Standing hip abduction x 10 each supported Supine calf stretch with strap  SLR with strap x 10 right Seated Calf stretch with strap   Therapeutic Activity: Gait with RW working on step through and constant movement of RW , cues for erect posture and forward gaze.  STS using UE     [facility] Adult PT Treatment:                                                 DATE: 07/20/24 Therapeutic Exercise: Nustep level 5 x 5 mins SLR RLE x10 with strap Seated hamstring curls RTB x10, GTB x15 Seated LAQ 2x15 BIL Supine hip abduction x10 GTB Therapeutic Activity: Seated marches 2x20 Heel raises x10  [facility] Adult PT Treatment:                                                DATE: 07/13/24 Therapeutic Exercise: Nustep level 5 x 5 mins Heel slides with strap 2x10 SLR 2x10 with strap Seated hamstring curls RTB 2x10 Seated LAQ 2x10 Supine hip abduction x10 GTB Therapeutic Activity: STS x5 Seated marches 3x15 Sitting lean forward with hands on yoga block and pushing through heels to lift butt 1 above bed x10  [facility] Adult PT Treatment:                                                DATE: 07/08/24 Therapeutic Exercise: Sitting LAQ eccentrics R only x15, strap assist to complete full concentric ext and eccentric lowering as needed Heel slide x15 c use of strap at EOM for stretch SLR c quad set x15, strap for concentric assist and eccentric lowering as needed Bridge 2x10 partial Therapeutic Activity: Nustep x8 mins L5 UE/LE 5xSTS 6 MWT STS x10 c use of hands Standing knee lifts 2x10 each Standing TKE 2x12 RTB   PATIENT EDUCATION:  Education details: Exam findings, POC, initial HEP Person educated: Patient Education method: Explanation, Demonstration, and Handouts Education comprehension: verbalized understanding,  returned demonstration, and needs further education  HOME EXERCISE PROGRAM: Access Code: [email or web address] URL: [email or web address] Date: 04/23/2024 Prepared by: Gellen April Marie Nonato  Exercises - Seated Heel Slide  - 1 x daily - 7 x weekly - 2 sets - 10 reps - Seated Quad Set  - 1 x daily - 7 x weekly - 2 sets - 10 reps - 3 sec hold - Seated Hip Abduction with Resistance  - 2 x daily - 7 x weekly - 1 sets - 10 reps - 3 sec hold - Seated Hip Flexion Isometric  - 2 x daily - 7 x weekly - 1 sets - 10 reps -  3 sec hold - Seated Active Hip Flexion  - 2 x daily - 7 x weekly - 1 sets - 10 reps - Beginner Bridge  - 2 x daily - 7 x weekly - 1 sets - 10 reps  ASSESSMENT:  CLINICAL IMPRESSION: Today's session focused on strengthening of the RLE,  fluid gait pattern with erect posture,  and calf flexibility. Pt is limited by her lack of DF which prevents normal gait pattern and STS transfer mechanics.  Patient will benefit from skilled PT in order to improve functional independence and mobility.   Progress note 05/20/2024: Pt attended physical therapy session for re-evaluation of R hip ORIF. Pt is progressing well and has met 6 goals, pt continues to work towards 2 other initial goals. Difficulties continue with RLE strength, and transfer quality necessary for a safe return to independent lifestyle inline with PLOF. 2 additional goals were added to further progress quality of AROM and ambulation quality necessary for return to higher level community participation. Pt required minimal cuing as well as no assistance for safe and appropriate performance of today's activities. Continue with therapeutic focus on RLE strengthening, quadriceps recruitment,ambulation quality, and transfer quality. Education was given to continue applying ADL education from previous sessions as well as performing HEP as prescribed with freedom to progress as tolerated using previous education on modification and exercise dosage. Pt has displayed and verbalized competence regarding this education.   Pt continues to require the intervention of skilled outpatient physical therapy to address the aforementioned deficits and progress towards a functional level in line with therapeutic goals.  OBJECTIVE IMPAIRMENTS: Abnormal gait, decreased activity tolerance, decreased balance, decreased coordination, decreased endurance, decreased mobility, difficulty walking, decreased ROM, decreased strength, hypomobility, increased fascial restrictions,  impaired flexibility, improper body mechanics, postural dysfunction, and pain.   ACTIVITY LIMITATIONS: carrying, lifting, bending, sitting, standing, squatting, sleeping, stairs, transfers, bed mobility, dressing, hygiene/grooming, and locomotion level  PARTICIPATION LIMITATIONS: meal prep, cleaning, driving, shopping, and community activity  PERSONAL FACTORS: Age, Fitness, Past/current experiences, and Time since onset of injury/illness/exacerbation are also affecting patient's functional outcome.   REHAB POTENTIAL: Good  CLINICAL DECISION MAKING: Evolving/moderate complexity  EVALUATION COMPLEXITY: Moderate   GOALS: Goals reviewed with patient? Yes  SHORT TERM GOALS: Target date: 05/13/2024  Pt will be ind with initial HEP Baseline: Goal status: MET Pt reports adherence 05/15/24  2.  Pt will have improved hip flexion to at least 110 deg AROM Baseline:  Goal status: MET (in supine, and seated AAROM)  3.  Pt will report improved medial thigh/knee pain by >/=25% Baseline:  Goal status: MET 05/15/24: Pt reports max pain at 6/10   LONG TERM GOALS: Target date: 07/21/2024   Pt will be ind with management and progression of HEP Baseline:  Goal status: MET 05/20/2024  2.  Pt will be able  to amb at least 400' with LRAD for increased home and limited community mobility Baseline:  Goal status: MET 05/20/2024 (walked around 500') 07/08/24= 600'  3.  Pt will have improved 5x STS to </=20 sec to reduce her fall risk and increase functional LE strength Baseline: 35.6 c use of hands to RW Goal status: ONGOING  4.  Pt will have improved R knee ext to within at least 10 deg of full extension to demo increased quad strength Baseline: 32 deg away from full ext 07/08/24: 30d from full ext Goal status: ONGOING  5.  Pt will have improved LEFS score to >/=42/80 to demo MCID Baseline: 33/80 05/20/2024: 53/80 Goal status: MET 05/20/2024  6. Pt will improve hip flexion to 110d AROM in  seated   Baseline: 110d AAROM  07/08/24: 90d  Goal status:ONGOING  7.  Pt will independently ambulate 500' with LRAD within 6 minutes to demonstrate improved weightbearing tolerance, BLE strength, and functional capacity for community ambulation.  Baseline: - 300' FWW  07/08/24: 300' FWW  Goal status: ONGOING   PLAN:  PT FREQUENCY: 2x/week  PT DURATION: 8 weeks  PLANNED INTERVENTIONS: 97164- PT Re-evaluation, 97750- Physical Performance Testing, 97110-Therapeutic exercises, 97530- Therapeutic activity, [id number]- Neuromuscular re-education, 97535- Self Care, [id number]- Manual therapy, [phone]- Gait training, [phone]- Aquatic Therapy, [phone]- Electrical stimulation (unattended), 97016- Vasopneumatic device, [id number]- Ultrasound, [id number]- Ionotophoresis 4mg /ml Dexamethasone , 79439 (1-2 muscles), [id number] (3+ muscles)- Dry Needling, Patient/Family education, Balance training, Stair training, Taping, Joint mobilization, Spinal mobilization, Vestibular training, Cryotherapy, and Moist heat  PLAN FOR NEXT SESSION: Continue with therapeutic focus on RLE strengthening, quadriceps recruitment,ambulation quality, and transfer quality.   Harlene Persons, PTA 07/22/24 11:42 AM Phone: [phone] Fax: [phone]

## 2024-07-27 NOTE — Therapy (Signed)
 OUTPATIENT PHYSICAL THERAPY TREATMENT   Patient Name: Renee Watts MRN: [id number] DOB:May 09, 1951, 73 y.o., female Today's Date: 07/28/2024  END OF SESSION:  PT End of Session - 07/28/24 0950     Visit Number 24    Number of Visits 32    Date for Recertification  09/11/24    Authorization Type [employer]    PT Start Time 0930    PT Stop Time 1015    PT Time Calculation (min) 45 min    Activity Tolerance Patient tolerated treatment well    Behavior During Therapy [facility] for tasks assessed/performed            Past Medical History:  Diagnosis Date   Allergy    Benign paroxysmal positional vertigo 06/25/2013   dx with prior PCP, s/p labs, mri and vestibular rehab; nasal spray for etd seemed to help   Blood in stool 03/11/2012   Dependent edema    Bilateral   Depression    GERD (gastroesophageal reflux disease)    on meds   Hyperlipidemia    on meds   Low back pain    sciatica; sees orthopedic specialist ([facility])for this and shoulder tendinitis   Obesity, diabetes, and hypertension syndrome (HCC)    on med   Osteoarthritis, knee    Bilateral, s/p 3 total knee replacement surgerues on right, last 2005. Dr. Duwayne   Pellegrini-Steida syndrome    Myositis ossificans   Seasonal allergies    Shingles 07/10/2012   LESIONS CLEARED BUT NOT TOLERATING ANY THING TIGHT AGAINST BODY--SHINGLES WERE AROSS BACK AND ABDOMEN   Past Surgical History:  Procedure Laterality Date   ABDOMINAL HYSTERECTOMY  1984   BLADDER SURGERY  03/2017   bladder tac per pt   COLONOSCOPY  2020   MS-MAC-goly (good)-hems/TAx5)   left shoulder steroid injection     ORIF FEMUR FRACTURE Right 12/06/2023   Procedure: OPEN REDUCTION INTERNAL FIXATION (ORIF) DISTAL FEMUR FRACTURE;  Surgeon: Kendal Franky SQUIBB, MD;  Location: [facility] OR;  Service: Orthopedics;  Laterality: Right;   POLYPECTOMY  2020   TA x 5   TONSILLECTOMY  1977   TOTAL HIP ARTHROPLASTY Right 10/10/2012   Procedure: R TOTAL  HIP ARTHROPLASTY ANTERIOR APPROACH;  Surgeon: Lonni CINDERELLA Poli, MD;  Location: [facility];  Service: Orthopedics;  Laterality: Right;  Right Total Hip Arthroplasty, Anterior Approach (C-Arm)   TOTAL KNEE ARTHROPLASTY Right    with multiple revisions, 2003, 2005   TOTAL KNEE ARTHROPLASTY Left 01/09/2013   Procedure: LEFT TOTAL KNEE ARTHROPLASTY;  Surgeon: Lonni CINDERELLA Poli, MD;  Location: [facility];  Service: Orthopedics;  Laterality: Left;   TUBAL LIGATION  1978   WISDOM TOOTH EXTRACTION  2013,2014   Patient Active Problem List   Diagnosis Date Noted   Closed fracture of distal end of right femur, unspecified fracture morphology, initial encounter (HCC) 12/05/2023   Lumbar radiculopathy 09/17/2023   Osteopenia 02/15/2021   Venous stasis of both lower extremities 06/27/2020   Bilateral lower extremity edema 02/05/2020   Recurrent sinusitis 09/03/2018   Vaginal vault prolapse after hysterectomy 02/13/2017   Obesity 01/06/2016   Low back pain 01/06/2016   Normocytic anemia 03/12/2013   Diabetic peripheral neuropathy (HCC) 09/01/2012   Hyperlipemia 09/16/2006   Hypertension associated with diabetes (HCC) 09/16/2006   GERD 09/16/2006   Type II diabetes mellitus with neurological manifestations (HCC) 06/17/2006   Osteoarthritis 06/17/2006   DEPENDENT EDEMA, LEGS, BILATERAL 06/17/2006    PCP: Mercer Clotilda SAUNDERS, MD  REFERRING  PROVIDER: Kendal Franky SQUIBB, MD  REFERRING DIAG: ORIF of right distal fracture performed on 12/06/23  THERAPY DIAG:  Pain in right leg  Muscle weakness (generalized)  Other abnormalities of gait and mobility  Pain in right hip  Rationale for Evaluation and Treatment: Rehabilitation  ONSET DATE: 12/06/23  SUBJECTIVE:   SUBJECTIVE STATEMENT: Patient reports she has bee consistent with completing her HEP.  From eval: Pt states she has been doing home health PT since her 12/06/23 hip surgery. Was d/c-ed ~1 month ago. Was able to walk 500 feet with PT. Pt  reports pain is more in the inside of her medial thigh. No pain where the incision is laterally. Has been using quad cane intermittently in the house. Pt is fearful of another fall. Pt notes she gets fatigued with prolonged standing but not limited by pain.   PERTINENT HISTORY: L3/4 laminectomy and PSIF on 09/17/23; neuropathy in legs, bilat knee replacement, R hip replacement   PAIN:  Are you having pain? Yes: NPRS scale: 6 currently for thigh muscles; at worst 10/10 Pain location: R medial thigh Pain description: sometimes sharp or achy Aggravating factors: Rolling in bed, stepping wrong, getting up quickly Relieving factors: hydrocodone , ice/heat  PRECAUTIONS: Fall  RED FLAGS: None   WEIGHT BEARING RESTRICTIONS: WBAT with no ROM restrictions per 12/06/23 surgery note  FALLS:  Has patient fallen in last 6 months? Yes. Number of falls 1  -- 12/05/23 resulting in her femur fracture  LIVING ENVIRONMENT: Lives with: lives with their spouse; children and grandchildren are in and out Lives in: House/apartment Stairs: 1 step up to enter Has following equipment at home: Single point cane, Quad cane small base, Walker - 2 wheeled, Grab bars, and walk in shower  OCCUPATION: Retired - loves to the [employer] and entertainment  PLOF: Independent  PATIENT GOALS: Return to cooking and normal activities without having to stop and rest  NEXT MD VISIT: 06/04/24 to see PCP  OBJECTIVE:  Note: Objective measures were completed at Evaluation unless otherwise noted.  DIAGNOSTIC FINDINGS: X-rays of the lumbar spine from 03/09/2024 were independently reviewed and interpreted, showing laminectomy defect at L3/4.  Spondylolisthesis at L3/4 that does not shift between flexion and extension views.  Posterior instrumentation at L3 and L4.  No lucency seen around the screws and none of them have backed out.  No fracture or dislocation seen.   PATIENT SURVEYS:  Lower Extremity Functional Score: 33 / 80 = 41.3 %   05/20/2024: 53/80    SENSATION: Neuropathy in bilat LEs -- knee to ankle  EDEMA:  Will swell if legs are down  MUSCLE LENGTH: Hamstrings: See knee AROM below  POSTURE: flexed trunk , weight shift left, and knees flexed  PALPATION: TTP medial knee/adductors  LOWER EXTREMITY ROM:  Active ROM Right eval Left eval Right 05/01/24 Left 05/01/24  Hip flexion sitting 95 PROM WNL in sup At least 110 deg 95 supine 110 supine  Hip extension      Hip abduction      Hip adduction      Hip internal rotation      Hip external rotation      Knee flexion sitting 80 110 85 110  Knee extension sitting -32 PROM can get -10 in sup -10 -9 -6  Ankle dorsiflexion      Ankle plantarflexion      Ankle inversion      Ankle eversion       (Blank rows = not tested)  LOWER EXTREMITY  MMT:   MMT Right eval Left eval  Hip flexion 3- 3+  Hip extension    Hip abduction supine 3+ 3+  Hip adduction    Hip internal rotation    Hip external rotation    Knee flexion 4 4  Knee extension 3- 3+  Ankle dorsiflexion    Ankle plantarflexion    Ankle inversion    Ankle eversion     (Blank rows = not tested)  LOWER EXTREMITY SPECIAL TESTS:  Hip special tests: Belvie (FABER) test: positive  and Thomas test: positive   FUNCTIONAL TESTS:  5 times sit to stand: with UE support 49.86 sec 05/20/2024: 79m, UE support from mat, height parallel with arm chair : 300' w/ FWW  GAIT: Distance walked: Into clinic Assistive device utilized: Environmental Consultant - 2 wheeled Level of assistance: Modified independence Comments: Diminished swing phase on R, bilat steppage gait, diminished heel/toe on R, flexed trunk and knees  [facility] Adult PT Treatment:                                                DATE: 07/28/24 Therapeutic Exercise/Activity: Nustep L5 UE/LE x 5 minutes  S/L hip abduction x 10 each  SLR with strap x 10 each LAQ x10 c strap x10 each STS c elevated mat table 2x10 Heel raise 10x2  [facility] Adult PT  Treatment:                                                DATE: 07/22/24 Therapeutic Exercise: Nustep L5 UE/LE x 5 minutes  Heel raise  Standing hip abduction x 10 each supported Supine calf stretch with strap  SLR with strap x 10 right Seated Calf stretch with strap   Therapeutic Activity: Gait with RW working on step through and constant movement of RW , cues for erect posture and forward gaze.  STS using UE   PATIENT EDUCATION:  Education details: Exam findings, POC, initial HEP Person educated: Patient Education method: Explanation, Demonstration, and Handouts Education comprehension: verbalized understanding, returned demonstration, and needs further education  HOME EXERCISE PROGRAM: Access Code: [email or web address] URL: [email or web address] Date: 04/23/2024 Prepared by: Gellen April Marie Nonato  Exercises - Seated Heel Slide  - 1 x daily - 7 x weekly - 2 sets - 10 reps - Seated Quad Set  - 1 x daily - 7 x weekly - 2 sets - 10 reps - 3 sec hold - Seated Hip Abduction with Resistance  - 2 x daily - 7 x weekly - 1 sets - 10 reps - 3 sec hold - Seated Hip Flexion Isometric  - 2 x daily - 7 x weekly - 1 sets - 10 reps - 3 sec hold - Seated Active Hip Flexion  - 2 x daily - 7 x weekly - 1 sets - 10 reps - Beginner Bridge  - 2 x daily - 7 x weekly - 1 sets - 10 reps  ASSESSMENT:  CLINICAL IMPRESSION: Pt was completed to address bilat LE strengthening. Pt was able to eccentrically lower both legs from SLR, the L better than the R, which is am improvement is strength. Pt tolerated PT today without adverse effects   Progress note 05/20/2024: Pt attended physical  therapy session for re-evaluation of R hip ORIF. Pt is progressing well and has met 6 goals, pt continues to work towards 2 other initial goals. Difficulties continue with RLE strength, and transfer quality necessary for a safe return to independent lifestyle inline with PLOF. 2 additional goals were added to further  progress quality of AROM and ambulation quality necessary for return to higher level community participation. Pt required minimal cuing as well as no assistance for safe and appropriate performance of today's activities. Continue with therapeutic focus on RLE strengthening, quadriceps recruitment,ambulation quality, and transfer quality. Education was given to continue applying ADL education from previous sessions as well as performing HEP as prescribed with freedom to progress as tolerated using previous education on modification and exercise dosage. Pt has displayed and verbalized competence regarding this education.   Pt continues to require the intervention of skilled outpatient physical therapy to address the aforementioned deficits and progress towards a functional level in line with therapeutic goals.  OBJECTIVE IMPAIRMENTS: Abnormal gait, decreased activity tolerance, decreased balance, decreased coordination, decreased endurance, decreased mobility, difficulty walking, decreased ROM, decreased strength, hypomobility, increased fascial restrictions, impaired flexibility, improper body mechanics, postural dysfunction, and pain.   ACTIVITY LIMITATIONS: carrying, lifting, bending, sitting, standing, squatting, sleeping, stairs, transfers, bed mobility, dressing, hygiene/grooming, and locomotion level  PARTICIPATION LIMITATIONS: meal prep, cleaning, driving, shopping, and community activity  PERSONAL FACTORS: Age, Fitness, Past/current experiences, and Time since onset of injury/illness/exacerbation are also affecting patient's functional outcome.   REHAB POTENTIAL: Good  CLINICAL DECISION MAKING: Evolving/moderate complexity  EVALUATION COMPLEXITY: Moderate   GOALS: Goals reviewed with patient? Yes  SHORT TERM GOALS: Target date: 05/13/2024  Pt will be ind with initial HEP Baseline: Goal status: MET Pt reports adherence 05/15/24  2.  Pt will have improved hip flexion to at least 110  deg AROM Baseline:  Goal status: MET (in supine, and seated AAROM)  3.  Pt will report improved medial thigh/knee pain by >/=25% Baseline:  Goal status: MET 05/15/24: Pt reports max pain at 6/10   LONG TERM GOALS: Target date: 07/21/2024   Pt will be ind with management and progression of HEP Baseline:  Goal status: MET 05/20/2024  2.  Pt will be able to amb at least 400' with LRAD for increased home and limited community mobility Baseline:  Goal status: MET 05/20/2024 (walked around 500') 07/08/24= 600'  3.  Pt will have improved 5x STS to </=20 sec to reduce her fall risk and increase functional LE strength Baseline: 35.6 c use of hands to RW Goal status: ONGOING  4.  Pt will have improved R knee ext to within at least 10 deg of full extension to demo increased quad strength Baseline: 32 deg away from full ext 07/08/24: 30d from full ext Goal status: ONGOING  5.  Pt will have improved LEFS score to >/=42/80 to demo MCID Baseline: 33/80 05/20/2024: 53/80 Goal status: MET 05/20/2024  6. Pt will improve hip flexion to 110d AROM in seated   Baseline: 110d AAROM  07/08/24: 90d  Goal status:ONGOING  7.  Pt will independently ambulate 500' with LRAD within 6 minutes to demonstrate improved weightbearing tolerance, BLE strength, and functional capacity for community ambulation.  Baseline: - 300' FWW  07/08/24: 300' FWW  Goal status: ONGOING   PLAN:  PT FREQUENCY: 2x/week  PT DURATION: 8 weeks  PLANNED INTERVENTIONS: [id number]- PT Re-evaluation, 97750- Physical Performance Testing, 97110-Therapeutic exercises, 97530- Therapeutic activity, [id number]- Neuromuscular re-education, 97535- Self Care, [id number]- Manual therapy,  02883- Gait training, 02886- Aquatic Therapy, [phone]- Electrical stimulation (unattended), [id number]- Vasopneumatic device, [id number]- Ultrasound, [id number]- Ionotophoresis 4mg /ml Dexamethasone , 79439 (1-2 muscles), [id number] (3+ muscles)- Dry Needling, Patient/Family education,  Balance training, Stair training, Taping, Joint mobilization, Spinal mobilization, Vestibular training, Cryotherapy, and Moist heat  PLAN FOR NEXT SESSION: Continue with therapeutic focus on RLE strengthening, quadriceps recruitment,ambulation quality, and transfer quality.   Mahki Spikes MS, PT 07/28/24 10:21 AM

## 2024-07-28 ENCOUNTER — Ambulatory Visit

## 2024-07-28 DIAGNOSIS — M25561 Pain in right knee: Secondary | ICD-10-CM | POA: Insufficient documentation

## 2024-07-28 DIAGNOSIS — R600 Localized edema: Secondary | ICD-10-CM | POA: Insufficient documentation

## 2024-07-28 DIAGNOSIS — M25551 Pain in right hip: Secondary | ICD-10-CM | POA: Insufficient documentation

## 2024-07-28 DIAGNOSIS — M6281 Muscle weakness (generalized): Secondary | ICD-10-CM | POA: Diagnosis present

## 2024-07-28 DIAGNOSIS — R262 Difficulty in walking, not elsewhere classified: Secondary | ICD-10-CM | POA: Insufficient documentation

## 2024-07-28 DIAGNOSIS — M25562 Pain in left knee: Secondary | ICD-10-CM | POA: Insufficient documentation

## 2024-07-28 DIAGNOSIS — M79604 Pain in right leg: Secondary | ICD-10-CM | POA: Insufficient documentation

## 2024-07-28 DIAGNOSIS — G8929 Other chronic pain: Secondary | ICD-10-CM | POA: Insufficient documentation

## 2024-07-28 DIAGNOSIS — R2689 Other abnormalities of gait and mobility: Secondary | ICD-10-CM | POA: Diagnosis present

## 2024-07-30 ENCOUNTER — Ambulatory Visit: Admitting: Family Medicine

## 2024-07-30 NOTE — Therapy (Signed)
 OUTPATIENT PHYSICAL THERAPY TREATMENT   Patient Name: Renee Watts MRN: [id number] DOB:March 23, 1951, 73 y.o., female Today's Date: 07/31/2024  END OF SESSION:  PT End of Session - 07/31/24 0952     Visit Number 25    Number of Visits 32    Date for Recertification  09/11/24    Authorization Type [employer]    Progress Note Due on Visit 30    PT Start Time 0930    PT Stop Time 1015    PT Time Calculation (min) 45 min    Activity Tolerance Patient tolerated treatment well    Behavior During Therapy [facility] for tasks assessed/performed             Past Medical History:  Diagnosis Date   Allergy    Benign paroxysmal positional vertigo 06/25/2013   dx with prior PCP, s/p labs, mri and vestibular rehab; nasal spray for etd seemed to help   Blood in stool 03/11/2012   Dependent edema    Bilateral   Depression    GERD (gastroesophageal reflux disease)    on meds   Hyperlipidemia    on meds   Low back pain    sciatica; sees orthopedic specialist ([facility])for this and shoulder tendinitis   Obesity, diabetes, and hypertension syndrome (HCC)    on med   Osteoarthritis, knee    Bilateral, s/p 3 total knee replacement surgerues on right, last 2005. Dr. Duwayne   Pellegrini-Steida syndrome    Myositis ossificans   Seasonal allergies    Shingles 07/10/2012   LESIONS CLEARED BUT NOT TOLERATING ANY THING TIGHT AGAINST BODY--SHINGLES WERE AROSS BACK AND ABDOMEN   Past Surgical History:  Procedure Laterality Date   ABDOMINAL HYSTERECTOMY  1984   BLADDER SURGERY  03/2017   bladder tac per pt   COLONOSCOPY  2020   MS-MAC-goly (good)-hems/TAx5)   left shoulder steroid injection     ORIF FEMUR FRACTURE Right 12/06/2023   Procedure: OPEN REDUCTION INTERNAL FIXATION (ORIF) DISTAL FEMUR FRACTURE;  Surgeon: Kendal Franky SQUIBB, MD;  Location: [facility];  Service: Orthopedics;  Laterality: Right;   POLYPECTOMY  2020   TA x 5   TONSILLECTOMY  1977   TOTAL HIP ARTHROPLASTY  Right 10/10/2012   Procedure: R TOTAL HIP ARTHROPLASTY ANTERIOR APPROACH;  Surgeon: Lonni CINDERELLA Poli, MD;  Location: [facility];  Service: Orthopedics;  Laterality: Right;  Right Total Hip Arthroplasty, Anterior Approach (C-Arm)   TOTAL KNEE ARTHROPLASTY Right    with multiple revisions, 2003, 2005   TOTAL KNEE ARTHROPLASTY Left 01/09/2013   Procedure: LEFT TOTAL KNEE ARTHROPLASTY;  Surgeon: Lonni CINDERELLA Poli, MD;  Location: [facility];  Service: Orthopedics;  Laterality: Left;   TUBAL LIGATION  1978   WISDOM TOOTH EXTRACTION  2013,2014   Patient Active Problem List   Diagnosis Date Noted   Closed fracture of distal end of right femur, unspecified fracture morphology, initial encounter (HCC) 12/05/2023   Lumbar radiculopathy 09/17/2023   Osteopenia 02/15/2021   Venous stasis of both lower extremities 06/27/2020   Bilateral lower extremity edema 02/05/2020   Recurrent sinusitis 09/03/2018   Vaginal vault prolapse after hysterectomy 02/13/2017   Obesity 01/06/2016   Low back pain 01/06/2016   Normocytic anemia 03/12/2013   Diabetic peripheral neuropathy (HCC) 09/01/2012   Hyperlipemia 09/16/2006   Hypertension associated with diabetes (HCC) 09/16/2006   GERD 09/16/2006   Type II diabetes mellitus with neurological manifestations (HCC) 06/17/2006   Osteoarthritis 06/17/2006   DEPENDENT EDEMA, LEGS, BILATERAL 06/17/2006  PCP: Mercer Clotilda SAUNDERS, MD  REFERRING PROVIDER: Kendal Franky SQUIBB, MD  REFERRING DIAG: ORIF of right distal fracture performed on 12/06/23  THERAPY DIAG:  Pain in right leg  Muscle weakness (generalized)  Other abnormalities of gait and mobility  Pain in right hip  Rationale for Evaluation and Treatment: Rehabilitation  ONSET DATE: 12/06/23  SUBJECTIVE:   SUBJECTIVE STATEMENT: Patient reports she has bee consistent with completing her HEP.  From eval: Pt states she has been doing home health PT since her 12/06/23 hip surgery. Was d/c-ed ~1 month ago.  Was able to walk 500 feet with PT. Pt reports pain is more in the inside of her medial thigh. No pain where the incision is laterally. Has been using quad cane intermittently in the house. Pt is fearful of another fall. Pt notes she gets fatigued with prolonged standing but not limited by pain.   PERTINENT HISTORY: L3/4 laminectomy and PSIF on 09/17/23; neuropathy in legs, bilat knee replacement, R hip replacement   PAIN:  Are you having pain? Yes: NPRS scale: 6 currently for thigh muscles; at worst 10/10 Pain location: R medial thigh Pain description: sometimes sharp or achy Aggravating factors: Rolling in bed, stepping wrong, getting up quickly Relieving factors: hydrocodone , ice/heat  PRECAUTIONS: Fall  RED FLAGS: None   WEIGHT BEARING RESTRICTIONS: WBAT with no ROM restrictions per 12/06/23 surgery note  FALLS:  Has patient fallen in last 6 months? Yes. Number of falls 1  -- 12/05/23 resulting in her femur fracture  LIVING ENVIRONMENT: Lives with: lives with their spouse; children and grandchildren are in and out Lives in: House/apartment Stairs: 1 step up to enter Has following equipment at home: Single point cane, Quad cane small base, Walker - 2 wheeled, Grab bars, and walk in shower  OCCUPATION: Retired - loves to the [employer] and entertainment  PLOF: Independent  PATIENT GOALS: Return to cooking and normal activities without having to stop and rest  NEXT MD VISIT: 06/04/24 to see PCP  OBJECTIVE:  Note: Objective measures were completed at Evaluation unless otherwise noted.  DIAGNOSTIC FINDINGS: X-rays of the lumbar spine from 03/09/2024 were independently reviewed and interpreted, showing laminectomy defect at L3/4.  Spondylolisthesis at L3/4 that does not shift between flexion and extension views.  Posterior instrumentation at L3 and L4.  No lucency seen around the screws and none of them have backed out.  No fracture or dislocation seen.   PATIENT SURVEYS:  Lower Extremity  Functional Score: 33 / 80 = 41.3 %  05/20/2024: 53/80    SENSATION: Neuropathy in bilat LEs -- knee to ankle  EDEMA:  Will swell if legs are down  MUSCLE LENGTH: Hamstrings: See knee AROM below  POSTURE: flexed trunk , weight shift left, and knees flexed  PALPATION: TTP medial knee/adductors  LOWER EXTREMITY ROM:  Active ROM Right eval Left eval Right 05/01/24 Left 05/01/24  Hip flexion sitting 95 PROM WNL in sup At least 110 deg 95 supine 110 supine  Hip extension      Hip abduction      Hip adduction      Hip internal rotation      Hip external rotation      Knee flexion sitting 80 110 85 110  Knee extension sitting -32 PROM can get -10 in sup -10 -9 -6  Ankle dorsiflexion      Ankle plantarflexion      Ankle inversion      Ankle eversion       (Blank  rows = not tested)  LOWER EXTREMITY MMT:   MMT Right eval Left eval  Hip flexion 3- 3+  Hip extension    Hip abduction supine 3+ 3+  Hip adduction    Hip internal rotation    Hip external rotation    Knee flexion 4 4  Knee extension 3- 3+  Ankle dorsiflexion    Ankle plantarflexion    Ankle inversion    Ankle eversion     (Blank rows = not tested)  LOWER EXTREMITY SPECIAL TESTS:  Hip special tests: Belvie (FABER) test: positive  and Thomas test: positive   FUNCTIONAL TESTS:  5 times sit to stand: with UE support 49.86 sec 05/20/2024: 45m, UE support from mat, height parallel with arm chair : 300' w/ FWW  GAIT: Distance walked: Into clinic Assistive device utilized: Environmental Consultant - 2 wheeled Level of assistance: Modified independence Comments: Diminished swing phase on R, bilat steppage gait, diminished heel/toe on R, flexed trunk and knees  [facility] Adult PT Treatment:                                                DATE: 07/31/24 Therapeutic Exercise/Activity: Nustep L5 UE/LE x 5 minutes  Lateral 4' step up 2x10 each c use of hands Side steps over low blocks c use of hands STS x10 c use of hands from  bari-mat LAQ x10 c strap x10 R, s strap L Heel raise 10x2 Standing hip abd 2x10 2#  [facility] Adult PT Treatment:                                                DATE: 07/28/24 Therapeutic Exercise/Activity: Nustep L5 UE/LE x 5 minutes  S/L hip abduction x 10 each  SLR with strap x 10 each LAQ x10 c strap x10 each STS c elevated mat table 2x10 Heel raise 10x2  PATIENT EDUCATION:  Education details: Exam findings, POC, initial HEP Person educated: Patient Education method: Explanation, Demonstration, and Handouts Education comprehension: verbalized understanding, returned demonstration, and needs further education  HOME EXERCISE PROGRAM: Access Code: [email or web address] URL: [email or web address] Date: 04/23/2024 Prepared by: Gellen April Marie Nonato  Exercises - Seated Heel Slide  - 1 x daily - 7 x weekly - 2 sets - 10 reps - Seated Quad Set  - 1 x daily - 7 x weekly - 2 sets - 10 reps - 3 sec hold - Seated Hip Abduction with Resistance  - 2 x daily - 7 x weekly - 1 sets - 10 reps - 3 sec hold - Seated Hip Flexion Isometric  - 2 x daily - 7 x weekly - 1 sets - 10 reps - 3 sec hold - Seated Active Hip Flexion  - 2 x daily - 7 x weekly - 1 sets - 10 reps - Beginner Bridge  - 2 x daily - 7 x weekly - 1 sets - 10 reps  ASSESSMENT:  CLINICAL IMPRESSION: Pt participated in PT in LE strengthening in both the open and closed chains. Pt is now able to complete con/ecc for a LAQ; LLE, and ecc RLE. Pt still needs partial assist with strap for con portion of LAQ R. Pt remarked noticing her legs feel stronger. Pt tolerated  prescribed exs without adverse effects. Pt will continue to benefit from skilled PT to address impairments for improved function.  Progress note 05/20/2024: Pt attended physical therapy session for re-evaluation of R hip ORIF. Pt is progressing well and has met 6 goals, pt continues to work towards 2 other initial goals. Difficulties continue with RLE strength, and transfer  quality necessary for a safe return to independent lifestyle inline with PLOF. 2 additional goals were added to further progress quality of AROM and ambulation quality necessary for return to higher level community participation. Pt required minimal cuing as well as no assistance for safe and appropriate performance of today's activities. Continue with therapeutic focus on RLE strengthening, quadriceps recruitment,ambulation quality, and transfer quality. Education was given to continue applying ADL education from previous sessions as well as performing HEP as prescribed with freedom to progress as tolerated using previous education on modification and exercise dosage. Pt has displayed and verbalized competence regarding this education.   Pt continues to require the intervention of skilled outpatient physical therapy to address the aforementioned deficits and progress towards a functional level in line with therapeutic goals.  OBJECTIVE IMPAIRMENTS: Abnormal gait, decreased activity tolerance, decreased balance, decreased coordination, decreased endurance, decreased mobility, difficulty walking, decreased ROM, decreased strength, hypomobility, increased fascial restrictions, impaired flexibility, improper body mechanics, postural dysfunction, and pain.   ACTIVITY LIMITATIONS: carrying, lifting, bending, sitting, standing, squatting, sleeping, stairs, transfers, bed mobility, dressing, hygiene/grooming, and locomotion level  PARTICIPATION LIMITATIONS: meal prep, cleaning, driving, shopping, and community activity  PERSONAL FACTORS: Age, Fitness, Past/current experiences, and Time since onset of injury/illness/exacerbation are also affecting patient's functional outcome.   REHAB POTENTIAL: Good  CLINICAL DECISION MAKING: Evolving/moderate complexity  EVALUATION COMPLEXITY: Moderate   GOALS: Goals reviewed with patient? Yes  SHORT TERM GOALS: Target date: 05/13/2024  Pt will be ind with initial  HEP Baseline: Goal status: MET Pt reports adherence 05/15/24  2.  Pt will have improved hip flexion to at least 110 deg AROM Baseline:  Goal status: MET (in supine, and seated AAROM)  3.  Pt will report improved medial thigh/knee pain by >/=25% Baseline:  Goal status: MET 05/15/24: Pt reports max pain at 6/10   LONG TERM GOALS: Target date: 07/21/2024   Pt will be ind with management and progression of HEP Baseline:  Goal status: MET 05/20/2024  2.  Pt will be able to amb at least 400' with LRAD for increased home and limited community mobility Baseline:  Goal status: MET 05/20/2024 (walked around 500') 07/08/24= 600'  3.  Pt will have improved 5x STS to </=20 sec to reduce her fall risk and increase functional LE strength Baseline: 35.6 c use of hands to RW Goal status: ONGOING  4.  Pt will have improved R knee ext to within at least 10 deg of full extension to demo increased quad strength Baseline: 32 deg away from full ext 07/08/24: 30d from full ext Goal status: ONGOING  5.  Pt will have improved LEFS score to >/=42/80 to demo MCID Baseline: 33/80 05/20/2024: 53/80 Goal status: MET 05/20/2024  6. Pt will improve hip flexion to 110d AROM in seated   Baseline: 110d AAROM  07/08/24: 90d  Goal status:ONGOING  7.  Pt will independently ambulate 500' with LRAD within 6 minutes to demonstrate improved weightbearing tolerance, BLE strength, and functional capacity for community ambulation.  Baseline: - 300' FWW  07/08/24: 300' FWW  Goal status: ONGOING   PLAN:  PT FREQUENCY: 2x/week  PT DURATION:  8 weeks  PLANNED INTERVENTIONS: 97164- PT Re-evaluation, 97750- Physical Performance Testing, 97110-Therapeutic exercises, 97530- Therapeutic activity, [id number]- Neuromuscular re-education, [phone]- Self Care, [id number]- Manual therapy, [phone]- Gait training, [phone]- Aquatic Therapy, [phone]- Electrical stimulation (unattended), 97016- Vasopneumatic device, [id number]- Ultrasound, [id number]-  Ionotophoresis 4mg /ml Dexamethasone , 79439 (1-2 muscles), [id number] (3+ muscles)- Dry Needling, Patient/Family education, Balance training, Stair training, Taping, Joint mobilization, Spinal mobilization, Vestibular training, Cryotherapy, and Moist heat  PLAN FOR NEXT SESSION: Continue with therapeutic focus on RLE strengthening, quadriceps recruitment,ambulation quality, and transfer quality.   Dreamer Carillo MS, PT 07/31/24 10:33 AM

## 2024-07-31 ENCOUNTER — Ambulatory Visit

## 2024-07-31 DIAGNOSIS — M6281 Muscle weakness (generalized): Secondary | ICD-10-CM

## 2024-07-31 DIAGNOSIS — M79604 Pain in right leg: Secondary | ICD-10-CM | POA: Diagnosis not present

## 2024-07-31 DIAGNOSIS — R2689 Other abnormalities of gait and mobility: Secondary | ICD-10-CM

## 2024-07-31 DIAGNOSIS — M25551 Pain in right hip: Secondary | ICD-10-CM

## 2024-08-03 ENCOUNTER — Ambulatory Visit (INDEPENDENT_AMBULATORY_CARE_PROVIDER_SITE_OTHER): Admitting: Family Medicine

## 2024-08-03 ENCOUNTER — Encounter: Payer: Self-pay | Admitting: Family Medicine

## 2024-08-03 ENCOUNTER — Other Ambulatory Visit: Payer: Self-pay | Admitting: Family Medicine

## 2024-08-03 VITALS — BP 130/84 | HR 67 | Temp 98.4°F | Ht 65.0 in | Wt 157.4 lb

## 2024-08-03 DIAGNOSIS — I10 Essential (primary) hypertension: Secondary | ICD-10-CM

## 2024-08-03 DIAGNOSIS — M79651 Pain in right thigh: Secondary | ICD-10-CM | POA: Diagnosis not present

## 2024-08-03 DIAGNOSIS — F4323 Adjustment disorder with mixed anxiety and depressed mood: Secondary | ICD-10-CM | POA: Diagnosis not present

## 2024-08-03 DIAGNOSIS — E1149 Type 2 diabetes mellitus with other diabetic neurological complication: Secondary | ICD-10-CM | POA: Diagnosis not present

## 2024-08-03 DIAGNOSIS — R600 Localized edema: Secondary | ICD-10-CM | POA: Diagnosis not present

## 2024-08-03 DIAGNOSIS — Z7984 Long term (current) use of oral hypoglycemic drugs: Secondary | ICD-10-CM

## 2024-08-03 DIAGNOSIS — M25471 Effusion, right ankle: Secondary | ICD-10-CM

## 2024-08-03 DIAGNOSIS — M1990 Unspecified osteoarthritis, unspecified site: Secondary | ICD-10-CM | POA: Diagnosis not present

## 2024-08-03 DIAGNOSIS — E1142 Type 2 diabetes mellitus with diabetic polyneuropathy: Secondary | ICD-10-CM

## 2024-08-03 MED ORDER — GABAPENTIN 300 MG PO CAPS: 600.0000 mg | ORAL_CAPSULE | Freq: Three times a day (TID) | ORAL | 3 refills | Status: AC

## 2024-08-03 NOTE — Progress Notes (Unsigned)
 "  Established Patient Office Visit   Subjective  Patient ID: Renee Watts, female    DOB: 1951/03/04  Age: 73 y.o. MRN: [id number]  Chief Complaint  Patient presents with   Anemia   Medical Management of Chronic Issues    Patient came in for a follow-up for diabetes, BP, and anxiety.  Needs a refill on her gabapentin .    Patient is a 73 year old female seen for follow-up on chronic conditions and ongoing concern.   Continued weakness in R thigh muscles since August. Despite attending physical therapy twice a week, she is frustrated with the slow progress. The pain is described as 'nagging' and radiates from her buttocks down her leg, which she attributes to arthritis.  She experiences swelling in her foot, particularly when sitting with her legs folded. The swelling subsides after lying down for about thirty minutes. She does not wear compression socks and acknowledges that the swelling is due to her leg hanging down. She has difficulty using recliners due to the need to push them down, which she cannot do without assistance.  She is currently taking gabapentin , 300 mg in the morning, at lunch, and 600 mg at night, which helps with the pain but does not make her sleepy.  Her social situation is challenging, as she relies on family members for transportation and assistance with daily activities. She is frustrated with her limited mobility and the need for assistance, which impacts her independence.     Patient Active Problem List   Diagnosis Date Noted   Closed fracture of distal end of right femur, unspecified fracture morphology, initial encounter (HCC) 12/05/2023   Lumbar radiculopathy 09/17/2023   Osteopenia 02/15/2021   Venous stasis of both lower extremities 06/27/2020   Bilateral lower extremity edema 02/05/2020   Recurrent sinusitis 09/03/2018   Vaginal vault prolapse after hysterectomy 02/13/2017   Obesity 01/06/2016   Low back pain 01/06/2016   Normocytic anemia  03/12/2013   Diabetic peripheral neuropathy (HCC) 09/01/2012   Hyperlipemia 09/16/2006   Hypertension associated with diabetes (HCC) 09/16/2006   GERD 09/16/2006   Type II diabetes mellitus with neurological manifestations (HCC) 06/17/2006   Osteoarthritis 06/17/2006   DEPENDENT EDEMA, LEGS, BILATERAL 06/17/2006   Past Medical History:  Diagnosis Date   Allergy    Benign paroxysmal positional vertigo 06/25/2013   dx with prior PCP, s/p labs, mri and vestibular rehab; nasal spray for etd seemed to help   Blood in stool 03/11/2012   Dependent edema    Bilateral   Depression    GERD (gastroesophageal reflux disease)    on meds   Hyperlipidemia    on meds   Low back pain    sciatica; sees orthopedic specialist ([facility])for this and shoulder tendinitis   Obesity, diabetes, and hypertension syndrome (HCC)    on med   Osteoarthritis, knee    Bilateral, s/p 3 total knee replacement surgerues on right, last 2005. Dr. Duwayne   Pellegrini-Steida syndrome    Myositis ossificans   Seasonal allergies    Shingles 07/10/2012   LESIONS CLEARED BUT NOT TOLERATING ANY THING TIGHT AGAINST BODY--SHINGLES WERE AROSS BACK AND ABDOMEN   Past Surgical History:  Procedure Laterality Date   ABDOMINAL HYSTERECTOMY  1984   BLADDER SURGERY  03/2017   bladder tac per pt   COLONOSCOPY  2020   MS-MAC-goly (good)-hems/TAx5)   left shoulder steroid injection     ORIF FEMUR FRACTURE Right 12/06/2023   Procedure: OPEN REDUCTION INTERNAL FIXATION (ORIF) DISTAL  FEMUR FRACTURE;  Surgeon: Kendal Franky SQUIBB, MD;  Location: [facility];  Service: Orthopedics;  Laterality: Right;   POLYPECTOMY  2020   TA x 5   TONSILLECTOMY  1977   TOTAL HIP ARTHROPLASTY Right 10/10/2012   Procedure: R TOTAL HIP ARTHROPLASTY ANTERIOR APPROACH;  Surgeon: Lonni CINDERELLA Poli, MD;  Location: [facility];  Service: Orthopedics;  Laterality: Right;  Right Total Hip Arthroplasty, Anterior Approach (C-Arm)   TOTAL KNEE ARTHROPLASTY Right     with multiple revisions, 2003, 2005   TOTAL KNEE ARTHROPLASTY Left 01/09/2013   Procedure: LEFT TOTAL KNEE ARTHROPLASTY;  Surgeon: Lonni CINDERELLA Poli, MD;  Location: [facility];  Service: Orthopedics;  Laterality: Left;   TUBAL LIGATION  1978   WISDOM TOOTH EXTRACTION  2013,2014   Social History   Tobacco Use   Smoking status: Never   Smokeless tobacco: Never  Vaping Use   Vaping status: Never Used  Substance Use Topics   Alcohol  use: No   Drug use: No   Family History  Problem Relation Age of Onset   Diabetes Mother    Hypertension Mother    Hypertension Father    Cancer Father        Lung CA   Coronary artery disease Father    Colon cancer Neg Hx    Colon polyps Neg Hx    Esophageal cancer Neg Hx    Rectal cancer Neg Hx    Stomach cancer Neg Hx    No Known Allergies  ROS Negative unless stated above    Objective:     BP 130/84 (BP Location: Right Arm, Patient Position: Sitting, Cuff Size: Normal)   Pulse 67   Temp 98.4 F (36.9 C) (Oral)   Ht 5' 5 (1.651 m)   Wt 157 lb 6.4 oz (71.4 kg)   SpO2 97%   BMI 26.19 kg/m  BP Readings from Last 3 Encounters:  08/03/24 130/84  06/04/24 122/76  03/04/24 108/72   Wt Readings from Last 3 Encounters:  08/03/24 157 lb 6.4 oz (71.4 kg)  06/04/24 153 lb 3.2 oz (69.5 kg)  03/20/24 153 lb (69.4 kg)      Physical Exam     06/04/2024   10:58 AM 03/20/2024    8:53 AM 03/04/2024   10:33 AM  Depression screen PHQ 2/9  Decreased Interest 1 0 2  Down, Depressed, Hopeless 1 0 2  PHQ - 2 Score 2 0 4  Altered sleeping 2 0 2  Tired, decreased energy 2 0 2  Change in appetite 2 0 1  Feeling bad or failure about yourself  0 0 1  Trouble concentrating 2 0 1  Moving slowly or fidgety/restless 0 0 0  Suicidal thoughts 0 0 0  PHQ-9 Score 10  0  11      Data saved with a previous flowsheet row definition      06/04/2024   10:58 AM 03/04/2024   10:33 AM 12/25/2023   11:47 AM 11/27/2023    3:24 PM  GAD 7 : Generalized  Anxiety Score  Nervous, Anxious, on Edge 1 1 1  0  Control/stop worrying 1 1 1  0  Worry too much - different things 1 1 1  0  Trouble relaxing 1 1 1 2   Restless 1 0 1 0  Easily annoyed or irritable 1 1 1  0  Afraid - awful might happen 1 2 1  0  Total GAD 7 Score 7 7 7 2   Anxiety Difficulty Somewhat difficult  No results found for any visits on 08/03/24.    Assessment & Plan:   Type II diabetes mellitus with neurological manifestations (HCC)  Essential hypertension  Vitamin D  deficiency  Iron deficiency anemia, unspecified iron deficiency anemia type  Adjustment disorder with mixed anxiety and depressed mood  Encounter for diabetic foot exam (HCC)  Chronic pain due to osteoarthritis of hip and right thigh   Chronic pain is exacerbated by activity and managed with gabapentin  and tramadol . Increased tramadol  dosage is desired for better control. Gabapentin  is increased to 300 mg three times daily. Tramadol  dosage is increased as needed.  Lower extremity muscle weakness and deconditioning   Muscle weakness and deconditioning in the lower extremities persist, with ongoing physical therapy and home exercises. Progress is slower than desired. Continue physical therapy twice weekly and perform home exercises on non-therapy days.  Lower extremity edema   Intermittent edema occurs in the lower extremities, and she is not using compression socks. Use of compression socks is encouraged.  Type 2 diabetes mellitus with neurological complications   Type 2 diabetes presents with potential neurological complications. There are concerns about blood sugar control, especially during holidays. Recheck A1c levels and follow dietary modifications for blood sugar management during holidays.  Return in about 3 months (around 11/01/2024).   Clotilda JONELLE Single, MD "

## 2024-08-03 NOTE — Patient Instructions (Addendum)
 See if the increased dose of gabapentin  helps.  If you are still having increased pain we can then send in the a prescription for tramadol  if needed as this medication can cause drowsiness.  And we do not want it to increase your risk of falls.  A1c was 7.2% this visit.  This is still considered controlled however stop eating the pork rinds.

## 2024-08-04 DIAGNOSIS — H2511 Age-related nuclear cataract, right eye: Secondary | ICD-10-CM | POA: Diagnosis not present

## 2024-08-05 NOTE — Therapy (Addendum)
 OUTPATIENT PHYSICAL THERAPY TREATMENT   Patient Name: Renee Watts MRN: [id number] DOB:04-Jan-1951, 73 y.o., female Today's Date: 08/06/2024  END OF SESSION:  PT End of Session - 08/06/24 0941     Visit Number 26    Date for Recertification  09/11/24    Authorization Type [employer]    Progress Note Due on Visit 30    PT Start Time 0932    PT Stop Time 1015    PT Time Calculation (min) 43 min    Activity Tolerance Patient tolerated treatment well    Behavior During Therapy [facility] for tasks assessed/performed              Past Medical History:  Diagnosis Date   Allergy    Benign paroxysmal positional vertigo 06/25/2013   dx with prior PCP, s/p labs, mri and vestibular rehab; nasal spray for etd seemed to help   Blood in stool 03/11/2012   Dependent edema    Bilateral   Depression    GERD (gastroesophageal reflux disease)    on meds   Hyperlipidemia    on meds   Low back pain    sciatica; sees orthopedic specialist ([facility])for this and shoulder tendinitis   Obesity, diabetes, and hypertension syndrome (HCC)    on med   Osteoarthritis, knee    Bilateral, s/p 3 total knee replacement surgerues on right, last 2005. Dr. Duwayne   Pellegrini-Steida syndrome    Myositis ossificans   Seasonal allergies    Shingles 07/10/2012   LESIONS CLEARED BUT NOT TOLERATING ANY THING TIGHT AGAINST BODY--SHINGLES WERE AROSS BACK AND ABDOMEN   Past Surgical History:  Procedure Laterality Date   ABDOMINAL HYSTERECTOMY  1984   BLADDER SURGERY  03/2017   bladder tac per pt   COLONOSCOPY  2020   MS-MAC-goly (good)-hems/TAx5)   left shoulder steroid injection     ORIF FEMUR FRACTURE Right 12/06/2023   Procedure: OPEN REDUCTION INTERNAL FIXATION (ORIF) DISTAL FEMUR FRACTURE;  Surgeon: Kendal Franky SQUIBB, MD;  Location: [facility] OR;  Service: Orthopedics;  Laterality: Right;   POLYPECTOMY  2020   TA x 5   TONSILLECTOMY  1977   TOTAL HIP ARTHROPLASTY Right 10/10/2012    Procedure: R TOTAL HIP ARTHROPLASTY ANTERIOR APPROACH;  Surgeon: Lonni CINDERELLA Poli, MD;  Location: [facility];  Service: Orthopedics;  Laterality: Right;  Right Total Hip Arthroplasty, Anterior Approach (C-Arm)   TOTAL KNEE ARTHROPLASTY Right    with multiple revisions, 2003, 2005   TOTAL KNEE ARTHROPLASTY Left 01/09/2013   Procedure: LEFT TOTAL KNEE ARTHROPLASTY;  Surgeon: Lonni CINDERELLA Poli, MD;  Location: [facility];  Service: Orthopedics;  Laterality: Left;   TUBAL LIGATION  1978   WISDOM TOOTH EXTRACTION  2013,2014   Patient Active Problem List   Diagnosis Date Noted   Closed fracture of distal end of right femur, unspecified fracture morphology, initial encounter (HCC) 12/05/2023   Lumbar radiculopathy 09/17/2023   Osteopenia 02/15/2021   Venous stasis of both lower extremities 06/27/2020   Bilateral lower extremity edema 02/05/2020   Recurrent sinusitis 09/03/2018   Vaginal vault prolapse after hysterectomy 02/13/2017   Obesity 01/06/2016   Low back pain 01/06/2016   Normocytic anemia 03/12/2013   Diabetic peripheral neuropathy (HCC) 09/01/2012   Hyperlipemia 09/16/2006   Hypertension associated with diabetes (HCC) 09/16/2006   GERD 09/16/2006   Type II diabetes mellitus with neurological manifestations (HCC) 06/17/2006   Osteoarthritis 06/17/2006   DEPENDENT EDEMA, LEGS, BILATERAL 06/17/2006    PCP: Mercer Kirsch  R, MD  REFERRING PROVIDER: Kendal Franky SQUIBB, MD  REFERRING DIAG: ORIF of right distal fracture performed on 12/06/23  THERAPY DIAG:  Pain in right leg  Muscle weakness (generalized)  Other abnormalities of gait and mobility  Pain in right hip  Rationale for Evaluation and Treatment: Rehabilitation  ONSET DATE: 12/06/23  SUBJECTIVE:   SUBJECTIVE STATEMENT: Patient reports she had cataract surgery 2 days ago and she has been feeling more fatigued.  From eval: Pt states she has been doing home health PT since her 12/06/23 hip surgery. Was d/c-ed ~1  month ago. Was able to walk 500 feet with PT. Pt reports pain is more in the inside of her medial thigh. No pain where the incision is laterally. Has been using quad cane intermittently in the house. Pt is fearful of another fall. Pt notes she gets fatigued with prolonged standing but not limited by pain.   PERTINENT HISTORY: L3/4 laminectomy and PSIF on 09/17/23; neuropathy in legs, bilat knee replacement, R hip replacement   PAIN:  Are you having pain? Yes: NPRS scale: 6 currently for thigh muscles; at worst 10/10 Pain location: R medial thigh Pain description: sometimes sharp or achy Aggravating factors: Rolling in bed, stepping wrong, getting up quickly Relieving factors: hydrocodone , ice/heat  PRECAUTIONS: Fall  RED FLAGS: None   WEIGHT BEARING RESTRICTIONS: WBAT with no ROM restrictions per 12/06/23 surgery note  FALLS:  Has patient fallen in last 6 months? Yes. Number of falls 1  -- 12/05/23 resulting in her femur fracture  LIVING ENVIRONMENT: Lives with: lives with their spouse; children and grandchildren are in and out Lives in: House/apartment Stairs: 1 step up to enter Has following equipment at home: Single point cane, Quad cane small base, Walker - 2 wheeled, Grab bars, and walk in shower  OCCUPATION: Retired - loves to the [employer] and entertainment  PLOF: Independent  PATIENT GOALS: Return to cooking and normal activities without having to stop and rest  NEXT MD VISIT: 06/04/24 to see PCP  OBJECTIVE:  Note: Objective measures were completed at Evaluation unless otherwise noted.  DIAGNOSTIC FINDINGS: X-rays of the lumbar spine from 03/09/2024 were independently reviewed and interpreted, showing laminectomy defect at L3/4.  Spondylolisthesis at L3/4 that does not shift between flexion and extension views.  Posterior instrumentation at L3 and L4.  No lucency seen around the screws and none of them have backed out.  No fracture or dislocation seen.   PATIENT SURVEYS:  Lower  Extremity Functional Score: 33 / 80 = 41.3 %  05/20/2024: 53/80    SENSATION: Neuropathy in bilat LEs -- knee to ankle  EDEMA:  Will swell if legs are down  MUSCLE LENGTH: Hamstrings: See knee AROM below  POSTURE: flexed trunk , weight shift left, and knees flexed  PALPATION: TTP medial knee/adductors  LOWER EXTREMITY ROM:  Active ROM Right eval Left eval Right 05/01/24 Left 05/01/24  Hip flexion sitting 95 PROM WNL in sup At least 110 deg 95 supine 110 supine  Hip extension      Hip abduction      Hip adduction      Hip internal rotation      Hip external rotation      Knee flexion sitting 80 110 85 110  Knee extension sitting -32 PROM can get -10 in sup -10 -9 -6  Ankle dorsiflexion      Ankle plantarflexion      Ankle inversion      Ankle eversion       (  Blank rows = not tested)  LOWER EXTREMITY MMT:   MMT Right eval Left eval  Hip flexion 3- 3+  Hip extension    Hip abduction supine 3+ 3+  Hip adduction    Hip internal rotation    Hip external rotation    Knee flexion 4 4  Knee extension 3- 3+  Ankle dorsiflexion    Ankle plantarflexion    Ankle inversion    Ankle eversion     (Blank rows = not tested)  LOWER EXTREMITY SPECIAL TESTS:  Hip special tests: Belvie (FABER) test: positive  and Thomas test: positive   FUNCTIONAL TESTS:  5 times sit to stand: with UE support 49.86 sec 05/20/2024: 95m, UE support from mat, height parallel with arm chair : 300' w/ FWW  GAIT: Distance walked: Into clinic Assistive device utilized: Environmental Consultant - 2 wheeled Level of assistance: Modified independence Comments: Diminished swing phase on R, bilat steppage gait, diminished heel/toe on R, flexed trunk and knees  [facility] Adult PT Treatment:                                                DATE: 08/06/24 Therapeutic Exercise/Activity: Nustep L5 UE/LE x 5 minutes  STS 2x5 c use of hands from bari-mat LAQ x10 c strap x10 R, s strap L Seated hip add sets c ball  x15 Seated hip abd GTB x15 Heel raise 10x2 Standing hip abd 2x10  [facility] Adult PT Treatment:                                                DATE: 07/31/24 Therapeutic Exercise/Activity: Nustep L5 UE/LE x 5 minutes  Lateral 4' step up 2x10 each c use of hands Side steps over low blocks c use of hands STS x10 c use of hands from bari-mat LAQ x10 c strap x10 R, s strap L Heel raise 10x2 Standing hip abd 2x10 2#  PATIENT EDUCATION:  Education details: Exam findings, POC, initial HEP Person educated: Patient Education method: Explanation, Demonstration, and Handouts Education comprehension: verbalized understanding, returned demonstration, and needs further education  HOME EXERCISE PROGRAM: Access Code: [email or web address] URL: [email or web address] Date: 04/23/2024 Prepared by: Gellen April Marie Nonato  Exercises - Seated Heel Slide  - 1 x daily - 7 x weekly - 2 sets - 10 reps - Seated Quad Set  - 1 x daily - 7 x weekly - 2 sets - 10 reps - 3 sec hold - Seated Hip Abduction with Resistance  - 2 x daily - 7 x weekly - 1 sets - 10 reps - 3 sec hold - Seated Hip Flexion Isometric  - 2 x daily - 7 x weekly - 1 sets - 10 reps - 3 sec hold - Seated Active Hip Flexion  - 2 x daily - 7 x weekly - 1 sets - 10 reps - Beginner Bridge  - 2 x daily - 7 x weekly - 1 sets - 10 reps  ASSESSMENT:  CLINICAL IMPRESSION: Pt tolerated exs for LE strengthening with out adverse effects. With pt feeling fatigued following cataract surgery 2 days ago, the demand for PT was lowered to meet her tolerance level and her exs were completed in sitting or standing to  minimize eye pressure.  Progress note 05/20/2024: Pt attended physical therapy session for re-evaluation of R hip ORIF. Pt is progressing well and has met 6 goals, pt continues to work towards 2 other initial goals. Difficulties continue with RLE strength, and transfer quality necessary for a safe return to independent lifestyle inline with PLOF. 2  additional goals were added to further progress quality of AROM and ambulation quality necessary for return to higher level community participation. Pt required minimal cuing as well as no assistance for safe and appropriate performance of today's activities. Continue with therapeutic focus on RLE strengthening, quadriceps recruitment,ambulation quality, and transfer quality. Education was given to continue applying ADL education from previous sessions as well as performing HEP as prescribed with freedom to progress as tolerated using previous education on modification and exercise dosage. Pt has displayed and verbalized competence regarding this education.   Pt continues to require the intervention of skilled outpatient physical therapy to address the aforementioned deficits and progress towards a functional level in line with therapeutic goals.  OBJECTIVE IMPAIRMENTS: Abnormal gait, decreased activity tolerance, decreased balance, decreased coordination, decreased endurance, decreased mobility, difficulty walking, decreased ROM, decreased strength, hypomobility, increased fascial restrictions, impaired flexibility, improper body mechanics, postural dysfunction, and pain.   ACTIVITY LIMITATIONS: carrying, lifting, bending, sitting, standing, squatting, sleeping, stairs, transfers, bed mobility, dressing, hygiene/grooming, and locomotion level  PARTICIPATION LIMITATIONS: meal prep, cleaning, driving, shopping, and community activity  PERSONAL FACTORS: Age, Fitness, Past/current experiences, and Time since onset of injury/illness/exacerbation are also affecting patient's functional outcome.   REHAB POTENTIAL: Good  CLINICAL DECISION MAKING: Evolving/moderate complexity  EVALUATION COMPLEXITY: Moderate   GOALS: Goals reviewed with patient? Yes  SHORT TERM GOALS: Target date: 05/13/2024  Pt will be ind with initial HEP Baseline: Goal status: MET Pt reports adherence 05/15/24  2.  Pt will have  improved hip flexion to at least 110 deg AROM Baseline:  Goal status: MET (in supine, and seated AAROM)  3.  Pt will report improved medial thigh/knee pain by >/=25% Baseline:  Goal status: MET 05/15/24: Pt reports max pain at 6/10   LONG TERM GOALS: Target date: 07/21/2024   Pt will be ind with management and progression of HEP Baseline:  Goal status: MET 05/20/2024  2.  Pt will be able to amb at least 400' with LRAD for increased home and limited community mobility Baseline:  Goal status: MET 05/20/2024 (walked around 500') 07/08/24= 600'  3.  Pt will have improved 5x STS to </=20 sec to reduce her fall risk and increase functional LE strength Baseline: 35.6 c use of hands to RW Goal status: ONGOING  4.  Pt will have improved R knee ext to within at least 10 deg of full extension to demo increased quad strength Baseline: 32 deg away from full ext 07/08/24: 30d from full ext Goal status: ONGOING  5.  Pt will have improved LEFS score to >/=42/80 to demo MCID Baseline: 33/80 05/20/2024: 53/80 Goal status: MET 05/20/2024  6. Pt will improve hip flexion to 110d AROM in seated   Baseline: 110d AAROM  07/08/24: 90d  Goal status:ONGOING  7.  Pt will independently ambulate 500' with LRAD within 6 minutes to demonstrate improved weightbearing tolerance, BLE strength, and functional capacity for community ambulation.  Baseline: - 300' FWW  07/08/24: 300' FWW  Goal status: ONGOING   PLAN:  PT FREQUENCY: 2x/week  PT DURATION: 8 weeks  PLANNED INTERVENTIONS: 97164- PT Re-evaluation, 97750- Physical Performance Testing, 97110-Therapeutic exercises, 97530- Therapeutic  activity, [id number]- Neuromuscular re-education, [phone]- Self Care, 02859- Manual therapy, [phone]- Gait training, [phone]- Aquatic Therapy, [phone]- Electrical stimulation (unattended), 97016- Vasopneumatic device, [id number]- Ultrasound, [id number]- Ionotophoresis 4mg /ml Dexamethasone , 20560 (1-2 muscles), 20561 (3+ muscles)- Dry  Needling, Patient/Family education, Balance training, Stair training, Taping, Joint mobilization, Spinal mobilization, Vestibular training, Cryotherapy, and Moist heat  PLAN FOR NEXT SESSION: Continue with therapeutic focus on RLE strengthening, quadriceps recruitment,ambulation quality, and transfer quality.   Kenedy Haisley MS, PT 08/06/2024 10:22 AM

## 2024-08-06 ENCOUNTER — Ambulatory Visit

## 2024-08-06 DIAGNOSIS — M79604 Pain in right leg: Secondary | ICD-10-CM | POA: Diagnosis not present

## 2024-08-06 DIAGNOSIS — R2689 Other abnormalities of gait and mobility: Secondary | ICD-10-CM

## 2024-08-06 DIAGNOSIS — M25551 Pain in right hip: Secondary | ICD-10-CM

## 2024-08-06 DIAGNOSIS — M6281 Muscle weakness (generalized): Secondary | ICD-10-CM

## 2024-08-06 NOTE — Therapy (Incomplete)
 OUTPATIENT PHYSICAL THERAPY TREATMENT   Patient Name: Renee Watts MRN: [id number] DOB:1951-01-22, 73 y.o., female Today's Date: 08/06/2024  END OF SESSION:        Past Medical History:  Diagnosis Date   Allergy    Benign paroxysmal positional vertigo 06/25/2013   dx with prior PCP, s/p labs, mri and vestibular rehab; nasal spray for etd seemed to help   Blood in stool 03/11/2012   Dependent edema    Bilateral   Depression    GERD (gastroesophageal reflux disease)    on meds   Hyperlipidemia    on meds   Low back pain    sciatica; sees orthopedic specialist ([facility])for this and shoulder tendinitis   Obesity, diabetes, and hypertension syndrome (HCC)    on med   Osteoarthritis, knee    Bilateral, s/p 3 total knee replacement surgerues on right, last 2005. Dr. Duwayne   Pellegrini-Steida syndrome    Myositis ossificans   Seasonal allergies    Shingles 07/10/2012   LESIONS CLEARED BUT NOT TOLERATING ANY THING TIGHT AGAINST BODY--SHINGLES WERE AROSS BACK AND ABDOMEN   Past Surgical History:  Procedure Laterality Date   ABDOMINAL HYSTERECTOMY  1984   BLADDER SURGERY  03/2017   bladder tac per pt   COLONOSCOPY  2020   MS-MAC-goly (good)-hems/TAx5)   left shoulder steroid injection     ORIF FEMUR FRACTURE Right 12/06/2023   Procedure: OPEN REDUCTION INTERNAL FIXATION (ORIF) DISTAL FEMUR FRACTURE;  Surgeon: Kendal Franky SQUIBB, MD;  Location: [facility];  Service: Orthopedics;  Laterality: Right;   POLYPECTOMY  2020   TA x 5   TONSILLECTOMY  1977   TOTAL HIP ARTHROPLASTY Right 10/10/2012   Procedure: R TOTAL HIP ARTHROPLASTY ANTERIOR APPROACH;  Surgeon: Lonni CINDERELLA Poli, MD;  Location: [facility];  Service: Orthopedics;  Laterality: Right;  Right Total Hip Arthroplasty, Anterior Approach (C-Arm)   TOTAL KNEE ARTHROPLASTY Right    with multiple revisions, 2003, 2005   TOTAL KNEE ARTHROPLASTY Left 01/09/2013   Procedure: LEFT TOTAL KNEE ARTHROPLASTY;  Surgeon:  Lonni CINDERELLA Poli, MD;  Location: [facility];  Service: Orthopedics;  Laterality: Left;   TUBAL LIGATION  1978   WISDOM TOOTH EXTRACTION  2013,2014   Patient Active Problem List   Diagnosis Date Noted   Closed fracture of distal end of right femur, unspecified fracture morphology, initial encounter (HCC) 12/05/2023   Lumbar radiculopathy 09/17/2023   Osteopenia 02/15/2021   Venous stasis of both lower extremities 06/27/2020   Bilateral lower extremity edema 02/05/2020   Recurrent sinusitis 09/03/2018   Vaginal vault 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  Rationale for Evaluation and Treatment: Rehabilitation  ONSET DATE: 12/06/23  SUBJECTIVE:   SUBJECTIVE STATEMENT: Patient reports she had cataract surgery 2 days ago and she has been feeling more fatigued.  From eval: Pt states she has been doing home health PT since her 12/06/23 hip surgery. Was d/c-ed ~1 month ago. Was able to walk 500  feet with PT. Pt reports pain is more in the inside of her medial thigh. No pain where the incision is laterally. Has been using quad cane intermittently in the house. Pt is fearful of another fall. Pt notes she gets fatigued with prolonged standing but not limited by pain.   PERTINENT HISTORY: L3/4 laminectomy and PSIF on 09/17/23; neuropathy in legs, bilat knee replacement, R hip replacement   PAIN:  Are you having pain? Yes: NPRS scale:  6 currently for thigh muscles; at worst 10/10 Pain location: R medial thigh Pain description: sometimes sharp or achy Aggravating factors: Rolling in bed, stepping wrong, getting up quickly Relieving factors: hydrocodone , ice/heat  PRECAUTIONS: Fall  RED FLAGS: None   WEIGHT BEARING RESTRICTIONS: WBAT with no ROM restrictions per 12/06/23 surgery note  FALLS:  Has patient fallen in last 6 months? Yes. Number of falls 1  -- 12/05/23 resulting in her femur fracture  LIVING ENVIRONMENT: Lives with: lives with their spouse; children and grandchildren are in and out Lives in: House/apartment Stairs: 1 step up to enter Has following equipment at home: Single point cane, Quad cane small base, Walker - 2 wheeled, Grab bars, and walk in shower  OCCUPATION: Retired - loves to the [employer] and entertainment  PLOF: Independent  PATIENT GOALS: Return to cooking and normal activities without having to stop and rest  NEXT MD VISIT: 06/04/24 to see PCP  OBJECTIVE:  Note: Objective measures were completed at Evaluation unless otherwise noted.  DIAGNOSTIC FINDINGS: X-rays of the lumbar spine from 03/09/2024 were independently reviewed and interpreted, showing laminectomy defect at L3/4.  Spondylolisthesis at L3/4 that does not shift between flexion and extension views.  Posterior instrumentation at L3 and L4.  No lucency seen around the screws and none of them have backed out.  No fracture or dislocation seen.   PATIENT SURVEYS:  Lower Extremity Functional Score: 33 / 80 = 41.3 %  05/20/2024: 53/80    SENSATION: Neuropathy in bilat LEs -- knee to ankle  EDEMA:  Will swell if legs are down  MUSCLE LENGTH: Hamstrings: See knee AROM below  POSTURE: flexed trunk , weight shift left, and knees flexed  PALPATION: TTP medial knee/adductors  LOWER EXTREMITY ROM:  Active ROM Right eval Left eval Right 05/01/24 Left 05/01/24  Hip flexion sitting 95 PROM WNL in sup At least 110 deg 95 supine  110 supine  Hip extension      Hip abduction      Hip adduction      Hip internal rotation      Hip external rotation      Knee flexion sitting 80 110 85 110  Knee extension sitting -32 PROM can get -10 in sup -10 -9 -6  Ankle dorsiflexion      Ankle plantarflexion      Ankle inversion      Ankle eversion       (Blank rows = not tested)  LOWER EXTREMITY MMT:   MMT Right eval Left eval  Hip flexion 3- 3+  Hip extension    Hip abduction supine 3+ 3+  Hip adduction    Hip internal rotation    Hip external rotation    Knee flexion 4 4  Knee extension 3- 3+  Ankle dorsiflexion    Ankle plantarflexion    Ankle inversion    Ankle eversion     (Blank rows = not tested)  LOWER EXTREMITY SPECIAL TESTS:  Hip special tests: [facility]) test: positive  and Debby  test: positive   FUNCTIONAL TESTS:  5 times sit to stand: with UE support 49.86 sec 05/20/2024: 40m, UE support from mat, height parallel with arm chair : 300' w/ FWW  GAIT: Distance walked: Into clinic Assistive device utilized: Environmental Consultant - 2 wheeled Level of assistance: Modified independence Comments: Diminished swing phase on R, bilat steppage gait, diminished heel/toe on R, flexed trunk and knees  [facility] Adult PT Treatment:                                                DATE: 08/06/24 Therapeutic Exercise/Activity: Nustep L5 UE/LE x 5 minutes  STS 2x5 c use of hands from bari-mat LAQ x10 c strap x10 R, s strap L Seated hip add sets c ball x15 Seated hip abd GTB x15 Heel raise 10x2 Standing hip abd 2x10  [facility] Adult PT Treatment:                                                DATE: 07/31/24 Therapeutic Exercise/Activity: Nustep L5 UE/LE x 5 minutes  Lateral 4' step up 2x10 each c use of hands Side steps over low blocks c use of hands STS x10 c use of hands from bari-mat LAQ x10 c strap x10 R, s strap L Heel raise 10x2 Standing hip abd 2x10 2#  PATIENT EDUCATION:  Education details: Exam findings,  POC, initial HEP Person educated: Patient Education method: Explanation, Demonstration, and Handouts Education comprehension: verbalized understanding, returned demonstration, and needs further education  HOME EXERCISE PROGRAM: Access Code: [email or web address] URL: [email or web address] Date: 04/23/2024 Prepared by: Gellen April Marie Nonato  Exercises - Seated Heel Slide  - 1 x daily - 7 x weekly - 2 sets - 10 reps - Seated Quad Set  - 1 x daily - 7 x weekly - 2 sets - 10 reps - 3 sec hold - Seated Hip Abduction with Resistance  - 2 x daily - 7 x weekly - 1 sets - 10 reps - 3 sec hold - Seated Hip Flexion Isometric  - 2 x daily - 7 x weekly - 1 sets - 10 reps - 3 sec hold - Seated Active Hip Flexion  - 2 x daily - 7 x weekly - 1 sets - 10 reps - Beginner Bridge  - 2 x daily - 7 x weekly - 1 sets - 10 reps  ASSESSMENT:  CLINICAL IMPRESSION: Pt tolerated exs for LE strengthening with out adverse effects. With pt feeling fatigued following cataract surgery 2 days ago, the demand for PT was lowered to meet her tolerance level and her exs were completed in sitting or standing to minimize eye pressure.  Pt participated in PT in LE strengthening in both the open and closed chains. Pt is now able to complete con/ecc for a LAQ; LLE, and ecc RLE. Pt still needs partial assist with strap for con portion of LAQ R. Pt remarked noticing her legs feel stronger. Pt tolerated prescribed exs without adverse effects. Pt will continue to benefit from skilled PT to address impairments for improved function.  Progress note 05/20/2024: Pt attended physical therapy session for re-evaluation of R hip ORIF. Pt is progressing well and has met 6 goals, pt continues to work towards  2 other initial goals. Difficulties continue with RLE strength, and transfer quality necessary for a safe return to independent lifestyle inline with PLOF. 2 additional goals were added to further progress quality of AROM and ambulation  quality necessary for return to higher level community participation. Pt required minimal cuing as well as no assistance for safe and appropriate performance of today's activities. Continue with therapeutic focus on RLE strengthening, quadriceps recruitment,ambulation quality, and transfer quality. Education was given to continue applying ADL education from previous sessions as well as performing HEP as prescribed with freedom to progress as tolerated using previous education on modification and exercise dosage. Pt has displayed and verbalized competence regarding this education.   Pt continues to require the intervention of skilled outpatient physical therapy to address the aforementioned deficits and progress towards a functional level in line with therapeutic goals.  OBJECTIVE IMPAIRMENTS: Abnormal gait, decreased activity tolerance, decreased balance, decreased coordination, decreased endurance, decreased mobility, difficulty walking, decreased ROM, decreased strength, hypomobility, increased fascial restrictions, impaired flexibility, improper body mechanics, postural dysfunction, and pain.   ACTIVITY LIMITATIONS: carrying, lifting, bending, sitting, standing, squatting, sleeping, stairs, transfers, bed mobility, dressing, hygiene/grooming, and locomotion level  PARTICIPATION LIMITATIONS: meal prep, cleaning, driving, shopping, and community activity  PERSONAL FACTORS: Age, Fitness, Past/current experiences, and Time since onset of injury/illness/exacerbation are also affecting patient's functional outcome.   REHAB POTENTIAL: Good  CLINICAL DECISION MAKING: Evolving/moderate complexity  EVALUATION COMPLEXITY: Moderate   GOALS: Goals reviewed with patient? Yes  SHORT TERM GOALS: Target date: 05/13/2024  Pt will be ind with initial HEP Baseline: Goal status: MET Pt reports adherence 05/15/24  2.  Pt will have improved hip flexion to at least 110 deg AROM Baseline:  Goal status: MET  (in supine, and seated AAROM)  3.  Pt will report improved medial thigh/knee pain by >/=25% Baseline:  Goal status: MET 05/15/24: Pt reports max pain at 6/10   LONG TERM GOALS: Target date: 07/21/2024   Pt will be ind with management and progression of HEP Baseline:  Goal status: MET 05/20/2024  2.  Pt will be able to amb at least 400' with LRAD for increased home and limited community mobility Baseline:  Goal status: MET 05/20/2024 (walked around 500') 07/08/24= 600'  3.  Pt will have improved 5x STS to </=20 sec to reduce her fall risk and increase functional LE strength Baseline: 35.6 c use of hands to RW Goal status: ONGOING  4.  Pt will have improved R knee ext to within at least 10 deg of full extension to demo increased quad strength Baseline: 32 deg away from full ext 07/08/24: 30d from full ext Goal status: ONGOING  5.  Pt will have improved LEFS score to >/=42/80 to demo MCID Baseline: 33/80 05/20/2024: 53/80 Goal status: MET 05/20/2024  6. Pt will improve hip flexion to 110d AROM in seated   Baseline: 110d AAROM  07/08/24: 90d  Goal status:ONGOING  7.  Pt will independently ambulate 500' with LRAD within 6 minutes to demonstrate improved weightbearing tolerance, BLE strength, and functional capacity for community ambulation.  Baseline: - 300' FWW  07/08/24: 300' FWW  Goal status: ONGOING   PLAN:  PT FREQUENCY: 2x/week  PT DURATION: 8 weeks  PLANNED INTERVENTIONS: 97164- PT Re-evaluation, 97750- Physical Performance Testing, 97110-Therapeutic exercises, 97530- Therapeutic activity, [id number]- Neuromuscular re-education, 97535- Self Care, [id number]- Manual therapy, [id number]- Gait training, [phone]- Aquatic Therapy, [phone]- Electrical stimulation (unattended), 97016- Vasopneumatic device, [id number]- Ultrasound, [id number]- Ionotophoresis 4mg /ml Dexamethasone , 79439 (1-2 muscles),  79438 (3+ muscles)- Dry Needling, Patient/Family education, Balance training, Stair training, Taping,  Joint mobilization, Spinal mobilization, Vestibular training, Cryotherapy, and Moist heat  PLAN FOR NEXT SESSION: Continue with therapeutic focus on RLE strengthening, quadriceps recruitment,ambulation quality, and transfer quality.   Amarianna Abplanalp MS, PT 08/06/2024 10:20 AM

## 2024-08-07 ENCOUNTER — Ambulatory Visit

## 2024-08-10 ENCOUNTER — Ambulatory Visit: Admitting: Physical Therapy

## 2024-08-10 ENCOUNTER — Encounter: Payer: Self-pay | Admitting: Physical Therapy

## 2024-08-10 DIAGNOSIS — M79604 Pain in right leg: Secondary | ICD-10-CM | POA: Diagnosis not present

## 2024-08-10 DIAGNOSIS — M6281 Muscle weakness (generalized): Secondary | ICD-10-CM

## 2024-08-10 NOTE — Therapy (Signed)
 OUTPATIENT PHYSICAL THERAPY TREATMENT   Patient Name: Renee Watts MRN: [id number] DOB:08-07-1951, 73 y.o., female Today's Date: 08/10/2024  END OF SESSION:  PT End of Session - 08/10/24 0938     Visit Number 27    Number of Visits 32    Date for Recertification  09/11/24    Authorization Type [employer]    PT Start Time 0935    PT Stop Time 1015    PT Time Calculation (min) 40 min              Past Medical History:  Diagnosis Date   Allergy    Benign paroxysmal positional vertigo 06/25/2013   dx with prior PCP, s/p labs, mri and vestibular rehab; nasal spray for etd seemed to help   Blood in stool 03/11/2012   Dependent edema    Bilateral   Depression    GERD (gastroesophageal reflux disease)    on meds   Hyperlipidemia    on meds   Low back pain    sciatica; sees orthopedic specialist ([facility])for this and shoulder tendinitis   Obesity, diabetes, and hypertension syndrome (HCC)    on med   Osteoarthritis, knee    Bilateral, s/p 3 total knee replacement surgerues on right, last 2005. Dr. Duwayne   Pellegrini-Steida syndrome    Myositis ossificans   Seasonal allergies    Shingles 07/10/2012   LESIONS CLEARED BUT NOT TOLERATING ANY THING TIGHT AGAINST BODY--SHINGLES WERE AROSS BACK AND ABDOMEN   Past Surgical History:  Procedure Laterality Date   ABDOMINAL HYSTERECTOMY  1984   BLADDER SURGERY  03/2017   bladder tac per pt   COLONOSCOPY  2020   MS-MAC-goly (good)-hems/TAx5)   left shoulder steroid injection     ORIF FEMUR FRACTURE Right 12/06/2023   Procedure: OPEN REDUCTION INTERNAL FIXATION (ORIF) DISTAL FEMUR FRACTURE;  Surgeon: Kendal Franky SQUIBB, MD;  Location: [facility];  Service: Orthopedics;  Laterality: Right;   POLYPECTOMY  2020   TA x 5   TONSILLECTOMY  1977   TOTAL HIP ARTHROPLASTY Right 10/10/2012   Procedure: R TOTAL HIP ARTHROPLASTY ANTERIOR APPROACH;  Surgeon: Lonni CINDERELLA Poli, MD;  Location: [facility];  Service:  Orthopedics;  Laterality: Right;  Right Total Hip Arthroplasty, Anterior Approach (C-Arm)   TOTAL KNEE ARTHROPLASTY Right    with multiple revisions, 2003, 2005   TOTAL KNEE ARTHROPLASTY Left 01/09/2013   Procedure: LEFT TOTAL KNEE ARTHROPLASTY;  Surgeon: Lonni CINDERELLA Poli, MD;  Location: [facility];  Service: Orthopedics;  Laterality: Left;   TUBAL LIGATION  1978   WISDOM TOOTH EXTRACTION  2013,2014   Patient Active Problem List   Diagnosis Date Noted   Closed fracture of distal end of right femur, unspecified fracture morphology, initial encounter (HCC) 12/05/2023   Lumbar radiculopathy 09/17/2023   Osteopenia 02/15/2021   Venous stasis of both lower extremities 06/27/2020   Bilateral lower extremity edema 02/05/2020   Recurrent sinusitis 09/03/2018   Vaginal vault prolapse after hysterectomy 02/13/2017   Obesity 01/06/2016   Low back pain 01/06/2016   Normocytic anemia 03/12/2013   Diabetic peripheral neuropathy (HCC) 09/01/2012   Hyperlipemia 09/16/2006   Hypertension associated with diabetes (HCC) 09/16/2006   GERD 09/16/2006   Type II diabetes mellitus with neurological manifestations (HCC) 06/17/2006   Osteoarthritis 06/17/2006   DEPENDENT EDEMA, LEGS, BILATERAL 06/17/2006    PCP: Mercer Clotilda SAUNDERS, MD  REFERRING PROVIDER: Kendal Franky SQUIBB, MD  REFERRING DIAG: ORIF of right distal fracture performed on 12/06/23  THERAPY DIAG:  Pain in right leg  Muscle weakness (generalized)  Rationale for Evaluation and Treatment: Rehabilitation  ONSET DATE: 12/06/23  SUBJECTIVE:   SUBJECTIVE STATEMENT: Patient reports she is still feeling fatigued from cataract surgery last week.   From eval: Pt states she has been doing home health PT since her 12/06/23 hip surgery. Was d/c-ed ~1 month ago. Was able to walk 500 feet with PT. Pt reports pain is more in the inside of her medial thigh. No pain where the incision is laterally. Has been using quad cane intermittently in the house. Pt  is fearful of another fall. Pt notes she gets fatigued with prolonged standing but not limited by pain.   PERTINENT HISTORY: L3/4 laminectomy and PSIF on 09/17/23; neuropathy in legs, bilat knee replacement, R hip replacement   PAIN:  Are you having pain? Yes: NPRS scale: 6 currently for thigh muscles; at worst 10/10 Pain location: R medial thigh Pain description: sometimes sharp or achy Aggravating factors: Rolling in bed, stepping wrong, getting up quickly Relieving factors: hydrocodone , ice/heat  PRECAUTIONS: Fall  RED FLAGS: None   WEIGHT BEARING RESTRICTIONS: WBAT with no ROM restrictions per 12/06/23 surgery note  FALLS:  Has patient fallen in last 6 months? Yes. Number of falls 1  -- 12/05/23 resulting in her femur fracture  LIVING ENVIRONMENT: Lives with: lives with their spouse; children and grandchildren are in and out Lives in: House/apartment Stairs: 1 step up to enter Has following equipment at home: Single point cane, Quad cane small base, Walker - 2 wheeled, Grab bars, and walk in shower  OCCUPATION: Retired - loves to the [employer] and entertainment  PLOF: Independent  PATIENT GOALS: Return to cooking and normal activities without having to stop and rest  NEXT MD VISIT: 06/04/24 to see PCP  OBJECTIVE:  Note: Objective measures were completed at Evaluation unless otherwise noted.  DIAGNOSTIC FINDINGS: X-rays of the lumbar spine from 03/09/2024 were independently reviewed and interpreted, showing laminectomy defect at L3/4.  Spondylolisthesis at L3/4 that does not shift between flexion and extension views.  Posterior instrumentation at L3 and L4.  No lucency seen around the screws and none of them have backed out.  No fracture or dislocation seen.   PATIENT SURVEYS:  Lower Extremity Functional Score: 33 / 80 = 41.3 %  05/20/2024: 53/80    SENSATION: Neuropathy in bilat LEs -- knee to ankle  EDEMA:  Will swell if legs are down  MUSCLE LENGTH: Hamstrings: See knee  AROM below  POSTURE: flexed trunk , weight shift left, and knees flexed  PALPATION: TTP medial knee/adductors  LOWER EXTREMITY ROM:  Active ROM Right eval Left eval Right 05/01/24 Left 05/01/24  Hip flexion sitting 95 PROM WNL in sup At least 110 deg 95 supine 110 supine  Hip extension      Hip abduction      Hip adduction      Hip internal rotation      Hip external rotation      Knee flexion sitting 80 110 85 110  Knee extension sitting -32 PROM can get -10 in sup -10 -9 -6  Ankle dorsiflexion      Ankle plantarflexion      Ankle inversion      Ankle eversion       (Blank rows = not tested)  LOWER EXTREMITY MMT:   MMT Right eval Left eval  Hip flexion 3- 3+  Hip extension    Hip abduction supine 3+ 3+  Hip  adduction    Hip internal rotation    Hip external rotation    Knee flexion 4 4  Knee extension 3- 3+  Ankle dorsiflexion    Ankle plantarflexion    Ankle inversion    Ankle eversion     (Blank rows = not tested)  LOWER EXTREMITY SPECIAL TESTS:  Hip special tests: Belvie (FABER) test: positive  and Thomas test: positive   FUNCTIONAL TESTS:  5 times sit to stand: with UE support 49.86 sec 05/20/2024: 53m, UE support from mat, height parallel with arm chair : 300' w/ FWW  GAIT: Distance walked: Into clinic Assistive device utilized: Environmental Consultant - 2 wheeled Level of assistance: Modified independence Comments: Diminished swing phase on R, bilat steppage gait, diminished heel/toe on R, flexed trunk and knees  [facility] Adult PT Treatment:                                                DATE: 08/10/24  Nustep L5 UE/LE x 7 min Standing heel raise Standing march Standing alternating hip abduction  Forearms on counter alternating hip extension Mini squat at counter x 10 STS elevated seated ,  hands on RW to sit 5 x 2  Supine march - difficult   [facility] Adult PT Treatment:                                                DATE: 08/06/24 Therapeutic  Exercise/Activity: Nustep L5 UE/LE x 5 minutes  STS 2x5 c use of hands from bari-mat LAQ x10 c strap x10 R, s strap L Seated hip add sets c ball x15 Seated hip abd GTB x15 Heel raise 10x2 Standing hip abd 2x10  [facility] Adult PT Treatment:                                                DATE: 07/31/24 Therapeutic Exercise/Activity: Nustep L5 UE/LE x 5 minutes  Lateral 4' step up 2x10 each c use of hands Side steps over low blocks c use of hands STS x10 c use of hands from bari-mat LAQ x10 c strap x10 R, s strap L Heel raise 10x2 Standing hip abd 2x10 2#  PATIENT EDUCATION:  Education details: Exam findings, POC, initial HEP Person educated: Patient Education method: Explanation, Demonstration, and Handouts Education comprehension: verbalized understanding, returned demonstration, and needs further education  HOME EXERCISE PROGRAM: Access Code: [email or web address] URL: [email or web address] Date: 04/23/2024 Prepared by: Gellen April Marie Nonato  Exercises - Seated Heel Slide  - 1 x daily - 7 x weekly - 2 sets - 10 reps - Seated Quad Set  - 1 x daily - 7 x weekly - 2 sets - 10 reps - 3 sec hold - Seated Hip Abduction with Resistance  - 2 x daily - 7 x weekly - 1 sets - 10 reps - 3 sec hold - Seated Hip Flexion Isometric  - 2 x daily - 7 x weekly - 1 sets - 10 reps - 3 sec hold - Seated Active Hip Flexion  - 2 x daily - 7 x weekly - 1  sets - 10 reps - Beginner Bridge  - 2 x daily - 7 x weekly - 1 sets - 10 reps  ASSESSMENT:  CLINICAL IMPRESSION: Pt tolerated exs for LE strengthening with out adverse effects. With pt still feeling fatigued following cataract surgery last week. She continues to require use of UE to assist RLE during transfers. Worked on STS from elevated surface focusing on sitting without UE assist. This was challenging for her, allowed her to hold RW on descent and she reported feeling her muscles in her thighs getting exercised.   Progress note 05/20/2024: Pt  attended physical therapy session for re-evaluation of R hip ORIF. Pt is progressing well and has met 6 goals, pt continues to work towards 2 other initial goals. Difficulties continue with RLE strength, and transfer quality necessary for a safe return to independent lifestyle inline with PLOF. 2 additional goals were added to further progress quality of AROM and ambulation quality necessary for return to higher level community participation. Pt required minimal cuing as well as no assistance for safe and appropriate performance of today's activities. Continue with therapeutic focus on RLE strengthening, quadriceps recruitment,ambulation quality, and transfer quality. Education was given to continue applying ADL education from previous sessions as well as performing HEP as prescribed with freedom to progress as tolerated using previous education on modification and exercise dosage. Pt has displayed and verbalized competence regarding this education.   Pt continues to require the intervention of skilled outpatient physical therapy to address the aforementioned deficits and progress towards a functional level in line with therapeutic goals.  OBJECTIVE IMPAIRMENTS: Abnormal gait, decreased activity tolerance, decreased balance, decreased coordination, decreased endurance, decreased mobility, difficulty walking, decreased ROM, decreased strength, hypomobility, increased fascial restrictions, impaired flexibility, improper body mechanics, postural dysfunction, and pain.   ACTIVITY LIMITATIONS: carrying, lifting, bending, sitting, standing, squatting, sleeping, stairs, transfers, bed mobility, dressing, hygiene/grooming, and locomotion level  PARTICIPATION LIMITATIONS: meal prep, cleaning, driving, shopping, and community activity  PERSONAL FACTORS: Age, Fitness, Past/current experiences, and Time since onset of injury/illness/exacerbation are also affecting patient's functional outcome.   REHAB POTENTIAL:  Good  CLINICAL DECISION MAKING: Evolving/moderate complexity  EVALUATION COMPLEXITY: Moderate   GOALS: Goals reviewed with patient? Yes  SHORT TERM GOALS: Target date: 05/13/2024  Pt will be ind with initial HEP Baseline: Goal status: MET Pt reports adherence 05/15/24  2.  Pt will have improved hip flexion to at least 110 deg AROM Baseline:  Goal status: MET (in supine, and seated AAROM)  3.  Pt will report improved medial thigh/knee pain by >/=25% Baseline:  Goal status: MET 05/15/24: Pt reports max pain at 6/10   LONG TERM GOALS: Target date: 07/21/2024   Pt will be ind with management and progression of HEP Baseline:  Goal status: MET 05/20/2024  2.  Pt will be able to amb at least 400' with LRAD for increased home and limited community mobility Baseline:  Goal status: MET 05/20/2024 (walked around 500') 07/08/24= 600'  3.  Pt will have improved 5x STS to </=20 sec to reduce her fall risk and increase functional LE strength Baseline: 35.6 c use of hands to RW Goal status: ONGOING  4.  Pt will have improved R knee ext to within at least 10 deg of full extension to demo increased quad strength Baseline: 32 deg away from full ext 07/08/24: 30d from full ext Goal status: ONGOING  5.  Pt will have improved LEFS score to >/=42/80 to demo MCID Baseline: 33/80 05/20/2024: 53/80 Goal  status: MET 05/20/2024  6. Pt will improve hip flexion to 110d AROM in seated   Baseline: 110d AAROM  07/08/24: 90d  Goal status:ONGOING  7.  Pt will independently ambulate 500' with LRAD within 6 minutes to demonstrate improved weightbearing tolerance, BLE strength, and functional capacity for community ambulation.  Baseline: - 300' FWW  07/08/24: 300' FWW  Goal status: ONGOING   PLAN:  PT FREQUENCY: 2x/week  PT DURATION: 8 weeks  PLANNED INTERVENTIONS: 97164- PT Re-evaluation, 97750- Physical Performance Testing, 97110-Therapeutic exercises, 97530- Therapeutic activity,  [id number]- Neuromuscular re-education, 97535- Self Care, [id number]- Manual therapy, [phone]- Gait training, [phone]- Aquatic Therapy, [phone]- Electrical stimulation (unattended), 97016- Vasopneumatic device, [id number]- Ultrasound, [id number]- Ionotophoresis 4mg /ml Dexamethasone , 79439 (1-2 muscles), [id number] (3+ muscles)- Dry Needling, Patient/Family education, Balance training, Stair training, Taping, Joint mobilization, Spinal mobilization, Vestibular training, Cryotherapy, and Moist heat  PLAN FOR NEXT SESSION: Continue with therapeutic focus on RLE strengthening, quadriceps recruitment,ambulation quality, and transfer quality.  Harlene Persons, PTA 08/10/2024 11:05 AM Phone: [phone] Fax: [phone]

## 2024-08-13 ENCOUNTER — Encounter: Payer: Self-pay | Admitting: Physical Therapy

## 2024-08-13 ENCOUNTER — Ambulatory Visit: Admitting: Physical Therapy

## 2024-08-13 DIAGNOSIS — M6281 Muscle weakness (generalized): Secondary | ICD-10-CM

## 2024-08-13 DIAGNOSIS — M79604 Pain in right leg: Secondary | ICD-10-CM | POA: Diagnosis not present

## 2024-08-13 NOTE — Therapy (Addendum)
 OUTPATIENT PHYSICAL THERAPY TREATMENT   Patient Name: ANAYI BRICCO MRN: [id number] DOB:1951/08/02, 73 y.o., female Today's Date: 08/13/2024  END OF SESSION:  PT End of Session - 08/13/24 0937     Visit Number 28    Number of Visits 32    Date for Recertification  09/11/24    Authorization Type [employer]    Progress Note Due on Visit 30    PT Start Time 0935    PT Stop Time 1017    PT Time Calculation (min) 42 min              Past Medical History:  Diagnosis Date   Allergy    Benign paroxysmal positional vertigo 06/25/2013   dx with prior PCP, s/p labs, mri and vestibular rehab; nasal spray for etd seemed to help   Blood in stool 03/11/2012   Dependent edema    Bilateral   Depression    GERD (gastroesophageal reflux disease)    on meds   Hyperlipidemia    on meds   Low back pain    sciatica; sees orthopedic specialist ([facility])for this and shoulder tendinitis   Obesity, diabetes, and hypertension syndrome (HCC)    on med   Osteoarthritis, knee    Bilateral, s/p 3 total knee replacement surgerues on right, last 2005. Dr. Duwayne   Pellegrini-Steida syndrome    Myositis ossificans   Seasonal allergies    Shingles 07/10/2012   LESIONS CLEARED BUT NOT TOLERATING ANY THING TIGHT AGAINST BODY--SHINGLES WERE AROSS BACK AND ABDOMEN   Past Surgical History:  Procedure Laterality Date   ABDOMINAL HYSTERECTOMY  1984   BLADDER SURGERY  03/2017   bladder tac per pt   COLONOSCOPY  2020   MS-MAC-goly (good)-hems/TAx5)   left shoulder steroid injection     ORIF FEMUR FRACTURE Right 12/06/2023   Procedure: OPEN REDUCTION INTERNAL FIXATION (ORIF) DISTAL FEMUR FRACTURE;  Surgeon: Kendal Franky SQUIBB, MD;  Location: [facility] OR;  Service: Orthopedics;  Laterality: Right;   POLYPECTOMY  2020   TA x 5   TONSILLECTOMY  1977   TOTAL HIP ARTHROPLASTY Right 10/10/2012   Procedure: R TOTAL HIP ARTHROPLASTY ANTERIOR APPROACH;  Surgeon: Lonni CINDERELLA Poli, MD;   Location: [facility];  Service: Orthopedics;  Laterality: Right;  Right Total Hip Arthroplasty, Anterior Approach (C-Arm)   TOTAL KNEE ARTHROPLASTY Right    with multiple revisions, 2003, 2005   TOTAL KNEE ARTHROPLASTY Left 01/09/2013   Procedure: LEFT TOTAL KNEE ARTHROPLASTY;  Surgeon: Lonni CINDERELLA Poli, MD;  Location: [facility];  Service: Orthopedics;  Laterality: Left;   TUBAL LIGATION  1978   WISDOM TOOTH EXTRACTION  2013,2014   Patient Active Problem List   Diagnosis Date Noted   Closed fracture of distal end of right femur, unspecified fracture morphology, initial encounter (HCC) 12/05/2023   Lumbar radiculopathy 09/17/2023   Osteopenia 02/15/2021   Venous stasis of both lower extremities 06/27/2020   Bilateral lower extremity edema 02/05/2020   Recurrent sinusitis 09/03/2018   Vaginal vault prolapse after hysterectomy 02/13/2017   Obesity 01/06/2016   Low back pain 01/06/2016   Normocytic anemia 03/12/2013   Diabetic peripheral neuropathy (HCC) 09/01/2012   Hyperlipemia 09/16/2006   Hypertension associated with diabetes (HCC) 09/16/2006   GERD 09/16/2006   Type II diabetes mellitus with neurological manifestations (HCC) 06/17/2006   Osteoarthritis 06/17/2006   DEPENDENT EDEMA, LEGS, BILATERAL 06/17/2006    PCP: Mercer Clotilda SAUNDERS, MD  REFERRING PROVIDER: Kendal Franky SQUIBB, MD  REFERRING DIAG:  ORIF of right distal fracture performed on 12/06/23  THERAPY DIAG:  Pain in right leg  Muscle weakness (generalized)  Rationale for Evaluation and Treatment: Rehabilitation  ONSET DATE: 12/06/23  SUBJECTIVE:   SUBJECTIVE STATEMENT: I have been doing a lot of walking and my legs are tired.   From eval: Pt states she has been doing home health PT since her 12/06/23 hip surgery. Was d/c-ed ~1 month ago. Was able to walk 500 feet with PT. Pt reports pain is more in the inside of her medial thigh. No pain where the incision is laterally. Has been using quad cane intermittently in the  house. Pt is fearful of another fall. Pt notes she gets fatigued with prolonged standing but not limited by pain.   PERTINENT HISTORY: L3/4 laminectomy and PSIF on 09/17/23; neuropathy in legs, bilat knee replacement, R hip replacement   PAIN:  Are you having pain? Yes: NPRS scale: 6 currently for thigh muscles; at worst 10/10 Pain location: R medial thigh Pain description: sometimes sharp or achy Aggravating factors: Rolling in bed, stepping wrong, getting up quickly Relieving factors: hydrocodone , ice/heat  PRECAUTIONS: Fall  RED FLAGS: None   WEIGHT BEARING RESTRICTIONS: WBAT with no ROM restrictions per 12/06/23 surgery note  FALLS:  Has patient fallen in last 6 months? Yes. Number of falls 1  -- 12/05/23 resulting in her femur fracture  LIVING ENVIRONMENT: Lives with: lives with their spouse; children and grandchildren are in and out Lives in: House/apartment Stairs: 1 step up to enter Has following equipment at home: Single point cane, Quad cane small base, Walker - 2 wheeled, Grab bars, and walk in shower  OCCUPATION: Retired - loves to the [employer] and entertainment  PLOF: Independent  PATIENT GOALS: Return to cooking and normal activities without having to stop and rest  NEXT MD VISIT: 06/04/24 to see PCP  OBJECTIVE:  Note: Objective measures were completed at Evaluation unless otherwise noted.  DIAGNOSTIC FINDINGS: X-rays of the lumbar spine from 03/09/2024 were independently reviewed and interpreted, showing laminectomy defect at L3/4.  Spondylolisthesis at L3/4 that does not shift between flexion and extension views.  Posterior instrumentation at L3 and L4.  No lucency seen around the screws and none of them have backed out.  No fracture or dislocation seen.   PATIENT SURVEYS:  Lower Extremity Functional Score: 33 / 80 = 41.3 %  05/20/2024: 53/80    SENSATION: Neuropathy in bilat LEs -- knee to ankle  EDEMA:  Will swell if legs are down  MUSCLE LENGTH: Hamstrings:  See knee AROM below  POSTURE: flexed trunk , weight shift left, and knees flexed  PALPATION: TTP medial knee/adductors  LOWER EXTREMITY ROM:  Active ROM Right eval Left eval Right 05/01/24 Left 05/01/24  Hip flexion sitting 95 PROM WNL in sup At least 110 deg 95 supine 110 supine  Hip extension      Hip abduction      Hip adduction      Hip internal rotation      Hip external rotation      Knee flexion sitting 80 110 85 110  Knee extension sitting -32 PROM can get -10 in sup -10 -9 -6  Ankle dorsiflexion      Ankle plantarflexion      Ankle inversion      Ankle eversion       (Blank rows = not tested)  LOWER EXTREMITY MMT:   MMT Right eval Left eval  Hip flexion 3- 3+  Hip extension  Hip abduction supine 3+ 3+  Hip adduction    Hip internal rotation    Hip external rotation    Knee flexion 4 4  Knee extension 3- 3+  Ankle dorsiflexion    Ankle plantarflexion    Ankle inversion    Ankle eversion     (Blank rows = not tested)  LOWER EXTREMITY SPECIAL TESTS:  Hip special tests: Belvie (FABER) test: positive  and Thomas test: positive   FUNCTIONAL TESTS:  5 times sit to stand: with UE support 49.86 sec 05/20/2024: 52m, UE support from mat, height parallel with arm chair : 300' w/ FWW  GAIT: Distance walked: Into clinic Assistive device utilized: Environmental Consultant - 2 wheeled Level of assistance: Modified independence Comments: Diminished swing phase on R, bilat steppage gait, diminished heel/toe on R, flexed trunk and knees  [facility] Adult PT Treatment:                                                DATE: 08/13/24 Therapeutic Exercise: Nustep L5 Ue/LE a 7 min Seated rocker board Seated calf stretch with strap  LAQ 10 x 2 each   Neuromuscular re-ed: Wide stance balance 30 sec x 3  Narrow stance balance needs 1 UE x 30 sec  Therapeutic Activity: STS 5 x 2 elevated surface  Standing march, hip abduction 10x 2 each  Heel raise      [facility] Adult PT  Treatment:                                                DATE: 08/10/24  Nustep L5 UE/LE x 7 min Standing heel raise Standing march Standing alternating hip abduction  Forearms on counter alternating hip extension Mini squat at counter x 10 STS elevated seated ,  hands on RW to sit 5 x 2  Supine march - difficult   [facility] Adult PT Treatment:                                                DATE: 08/06/24 Therapeutic Exercise/Activity: Nustep L5 UE/LE x 5 minutes  STS 2x5 c use of hands from bari-mat LAQ x10 c strap x10 R, s strap L Seated hip add sets c ball x15 Seated hip abd GTB x15 Heel raise 10x2 Standing hip abd 2x10  [facility] Adult PT Treatment:                                                DATE: 07/31/24 Therapeutic Exercise/Activity: Nustep L5 UE/LE x 5 minutes  Lateral 4' step up 2x10 each c use of hands Side steps over low blocks c use of hands STS x10 c use of hands from bari-mat LAQ x10 c strap x10 R, s strap L Heel raise 10x2 Standing hip abd 2x10 2#  PATIENT EDUCATION:  Education details: Exam findings, POC, initial HEP Person educated: Patient Education method: Explanation, Demonstration, and Handouts Education comprehension: verbalized understanding, returned demonstration, and needs  further education  HOME EXERCISE PROGRAM: Access Code: [email or web address] URL: [email or web address] Date: 04/23/2024 Prepared by: Gellen April Marie Nonato  Exercises - Seated Heel Slide  - 1 x daily - 7 x weekly - 2 sets - 10 reps - Seated Quad Set  - 1 x daily - 7 x weekly - 2 sets - 10 reps - 3 sec hold - Seated Hip Abduction with Resistance  - 2 x daily - 7 x weekly - 1 sets - 10 reps - 3 sec hold - Seated Hip Flexion Isometric  - 2 x daily - 7 x weekly - 1 sets - 10 reps - 3 sec hold - Seated Active Hip Flexion  - 2 x daily - 7 x weekly - 1 sets - 10 reps - Beginner Bridge  - 2 x daily - 7 x weekly - 1 sets - 10 reps  ASSESSMENT:  CLINICAL IMPRESSION: Pt tolerated  exs for LE strengthening with out adverse effects. Incorporated standing balance without UE which she can hold for approx 30 sec prior to LOB. She reports knees feel unstable without UE support. Continued working on STS from elevated surface focusing on sitting without UE assist.    Recert 07/08/24: Assessed 5xSTS, 6 MWT, and walking tolerance. 5xSTS is significantly declined as well as her . Pt's walking tolerance, walking distance, has improved, but not the pace. Pt reports functioning in her home is better c bed mobility, sitting to standing, and standing tolerance in the kitchen. Pt's progress in PT has been very slow, but gains are being made. Progress has been impacted by the the significant weakness of the R hip and knee seeming related to the multiple surgeries she has had on this leg. A FWW continues to be the most appropriate AD at this time. Pt will continue to benefit from skilled PT 2w8 to address impairments for improved functional mobility and safety at home.    Progress note 05/20/2024: Pt attended physical therapy session for re-evaluation of R hip ORIF. Pt is progressing well and has met 6 goals, pt continues to work towards 2 other initial goals. Difficulties continue with RLE strength, and transfer quality necessary for a safe return to independent lifestyle inline with PLOF. 2 additional goals were added to further progress quality of AROM and ambulation quality necessary for return to higher level community participation. Pt required minimal cuing as well as no assistance for safe and appropriate performance of today's activities. Continue with therapeutic focus on RLE strengthening, quadriceps recruitment,ambulation quality, and transfer quality. Education was given to continue applying ADL education from previous sessions as well as performing HEP as prescribed with freedom to progress as tolerated using previous education on modification and exercise dosage. Pt has displayed and  verbalized competence regarding this education.   Pt continues to require the intervention of skilled outpatient physical therapy to address the aforementioned deficits and progress towards a functional level in line with therapeutic goals.  OBJECTIVE IMPAIRMENTS: Abnormal gait, decreased activity tolerance, decreased balance, decreased coordination, decreased endurance, decreased mobility, difficulty walking, decreased ROM, decreased strength, hypomobility, increased fascial restrictions, impaired flexibility, improper body mechanics, postural dysfunction, and pain.   ACTIVITY LIMITATIONS: carrying, lifting, bending, sitting, standing, squatting, sleeping, stairs, transfers, bed mobility, dressing, hygiene/grooming, and locomotion level  PARTICIPATION LIMITATIONS: meal prep, cleaning, driving, shopping, and community activity  PERSONAL FACTORS: Age, Fitness, Past/current experiences, and Time since onset of injury/illness/exacerbation are also affecting patient's functional outcome.   REHAB POTENTIAL: Good  CLINICAL DECISION MAKING: Evolving/moderate complexity  EVALUATION COMPLEXITY: Moderate   GOALS: Goals reviewed with patient? Yes  SHORT TERM GOALS: Target date: 05/13/2024  Pt will be ind with initial HEP Baseline: Goal status: MET Pt reports adherence 05/15/24  2.  Pt will have improved hip flexion to at least 110 deg AROM Baseline:  Goal status: MET (in supine, and seated AAROM)  3.  Pt will report improved medial thigh/knee pain by >/=25% Baseline:  Goal status: MET 05/15/24: Pt reports max pain at 6/10   LONG TERM GOALS: Target date: 07/21/2024   Pt will be ind with management and progression of HEP Baseline:  Goal status: MET 05/20/2024  2.  Pt will be able to amb at least 400' with LRAD for increased home and limited community mobility Baseline:  Goal status: MET 05/20/2024 (walked around 500') 07/08/24= 600'  3.  Pt will have improved 5x STS to </=20 sec to  reduce her fall risk and increase functional LE strength Baseline: 35.6 c use of hands to RW Goal status: ONGOING  4.  Pt will have improved R knee ext to within at least 10 deg of full extension to demo increased quad strength Baseline: 32 deg away from full ext 07/08/24: 30d from full ext Goal status: ONGOING  5.  Pt will have improved LEFS score to >/=42/80 to demo MCID Baseline: 33/80 05/20/2024: 53/80 Goal status: MET 05/20/2024  6. Pt will improve hip flexion to 110d AROM in seated   Baseline: 110d AAROM  07/08/24: 90d  Goal status:ONGOING  7.  Pt will independently ambulate 500' with LRAD within 6 minutes to demonstrate improved weightbearing tolerance, BLE strength, and functional capacity for community ambulation.  Baseline: - 300' FWW  07/08/24: 300' FWW  Goal status: ONGOING   PLAN:  PT FREQUENCY: 2x/week  PT DURATION: 8 weeks  PLANNED INTERVENTIONS: 97164- PT Re-evaluation, 97750- Physical Performance Testing, 97110-Therapeutic exercises, 97530- Therapeutic activity, [id number]- Neuromuscular re-education, 97535- Self Care, [id number]- Manual therapy, [phone]- Gait training, [phone]- Aquatic Therapy, [phone]- Electrical stimulation (unattended), 97016- Vasopneumatic device, [id number]- Ultrasound, [id number]- Ionotophoresis 4mg /ml Dexamethasone , 79439 (1-2 muscles), 20561 (3+ muscles)- Dry Needling, Patient/Family education, Balance training, Stair training, Taping, Joint mobilization, Spinal mobilization, Vestibular training, Cryotherapy, and Moist heat  PLAN FOR NEXT SESSION: Continue with therapeutic focus on RLE strengthening, quadriceps recruitment,ambulation quality, and transfer quality.   Harlene Persons, PTA 08/13/2024 1:21 PM Phone: [phone] Fax: [phone]

## 2024-08-18 ENCOUNTER — Encounter: Payer: Self-pay | Admitting: Physical Therapy

## 2024-08-18 ENCOUNTER — Other Ambulatory Visit: Payer: Self-pay | Admitting: Family Medicine

## 2024-08-18 ENCOUNTER — Ambulatory Visit: Admitting: Physical Therapy

## 2024-08-18 ENCOUNTER — Ambulatory Visit: Payer: Self-pay

## 2024-08-18 DIAGNOSIS — M6281 Muscle weakness (generalized): Secondary | ICD-10-CM

## 2024-08-18 DIAGNOSIS — M79604 Pain in right leg: Secondary | ICD-10-CM

## 2024-08-18 MED ORDER — TRAMADOL HCL 50 MG PO TABS: 100.0000 mg | ORAL_TABLET | Freq: Two times a day (BID) | ORAL | 0 refills | Status: DC | PRN

## 2024-08-18 NOTE — Therapy (Signed)
 " OUTPATIENT PHYSICAL THERAPY TREATMENT   Patient Name: Renee Watts MRN: [id number] DOB:05/14/1951, 73 y.o., female Today's Date: 08/18/2024  END OF SESSION:  PT End of Session - 08/18/24 0940     Visit Number 29    Number of Visits 32    Date for Recertification  09/11/24    Authorization Type [employer]- 30 visit limit    Authorization - Visit Number 29    Authorization - Number of Visits 30    Progress Note Due on Visit 30    PT Start Time 0930    PT Stop Time 1015    PT Time Calculation (min) 45 min              Past Medical History:  Diagnosis Date   Allergy    Benign paroxysmal positional vertigo 06/25/2013   dx with prior PCP, s/p labs, mri and vestibular rehab; nasal spray for etd seemed to help   Blood in stool 03/11/2012   Dependent edema    Bilateral   Depression    GERD (gastroesophageal reflux disease)    on meds   Hyperlipidemia    on meds   Low back pain    sciatica; sees orthopedic specialist ([facility])for this and shoulder tendinitis   Obesity, diabetes, and hypertension syndrome (HCC)    on med   Osteoarthritis, knee    Bilateral, s/p 3 total knee replacement surgerues on right, last 2005. Dr. Duwayne   Pellegrini-Steida syndrome    Myositis ossificans   Seasonal allergies    Shingles 07/10/2012   LESIONS CLEARED BUT NOT TOLERATING ANY THING TIGHT AGAINST BODY--SHINGLES WERE AROSS BACK AND ABDOMEN   Past Surgical History:  Procedure Laterality Date   ABDOMINAL HYSTERECTOMY  1984   BLADDER SURGERY  03/2017   bladder tac per pt   COLONOSCOPY  2020   MS-MAC-goly (good)-hems/TAx5)   left shoulder steroid injection     ORIF FEMUR FRACTURE Right 12/06/2023   Procedure: OPEN REDUCTION INTERNAL FIXATION (ORIF) DISTAL FEMUR FRACTURE;  Surgeon: Kendal Franky SQUIBB, MD;  Location: [facility] OR;  Service: Orthopedics;  Laterality: Right;   POLYPECTOMY  2020   TA x 5   TONSILLECTOMY  1977   TOTAL HIP ARTHROPLASTY Right 10/10/2012    Procedure: R TOTAL HIP ARTHROPLASTY ANTERIOR APPROACH;  Surgeon: Lonni CINDERELLA Poli, MD;  Location: [facility];  Service: Orthopedics;  Laterality: Right;  Right Total Hip Arthroplasty, Anterior Approach (C-Arm)   TOTAL KNEE ARTHROPLASTY Right    with multiple revisions, 2003, 2005   TOTAL KNEE ARTHROPLASTY Left 01/09/2013   Procedure: LEFT TOTAL KNEE ARTHROPLASTY;  Surgeon: Lonni CINDERELLA Poli, MD;  Location: [facility];  Service: Orthopedics;  Laterality: Left;   TUBAL LIGATION  1978   WISDOM TOOTH EXTRACTION  2013,2014   Patient Active Problem List   Diagnosis Date Noted   Closed fracture of distal end of right femur, unspecified fracture morphology, initial encounter (HCC) 12/05/2023   Lumbar radiculopathy 09/17/2023   Osteopenia 02/15/2021   Venous stasis of both lower extremities 06/27/2020   Bilateral lower extremity edema 02/05/2020   Recurrent sinusitis 09/03/2018   Vaginal vault prolapse after hysterectomy 02/13/2017   Obesity 01/06/2016   Low back pain 01/06/2016   Normocytic anemia 03/12/2013   Diabetic peripheral neuropathy (HCC) 09/01/2012   Hyperlipemia 09/16/2006   Hypertension associated with diabetes (HCC) 09/16/2006   GERD 09/16/2006   Type II diabetes mellitus with neurological manifestations (HCC) 06/17/2006   Osteoarthritis 06/17/2006   DEPENDENT EDEMA,  LEGS, BILATERAL 06/17/2006    PCP: Mercer Clotilda SAUNDERS, MD  REFERRING PROVIDER: Kendal Franky SQUIBB, MD  REFERRING DIAG: ORIF of right distal fracture performed on 12/06/23  THERAPY DIAG:  Pain in right leg  Muscle weakness (generalized)  Rationale for Evaluation and Treatment: Rehabilitation  ONSET DATE: 12/06/23  SUBJECTIVE:   SUBJECTIVE STATEMENT: I was in the store for an hour yesterday and I used my walker I did not ride the cart.   From eval: Pt states she has been doing home health PT since her 12/06/23 hip surgery. Was d/c-ed ~1 month ago. Was able to walk 500 feet with PT. Pt reports pain is more in  the inside of her medial thigh. No pain where the incision is laterally. Has been using quad cane intermittently in the house. Pt is fearful of another fall. Pt notes she gets fatigued with prolonged standing but not limited by pain.   PERTINENT HISTORY: L3/4 laminectomy and PSIF on 09/17/23; neuropathy in legs, bilat knee replacement, R hip replacement   PAIN:  Are you having pain? Yes: NPRS scale: 6 currently for thigh muscles; at worst 10/10 Pain location: R medial thigh Pain description: sometimes sharp or achy Aggravating factors: Rolling in bed, stepping wrong, getting up quickly Relieving factors: hydrocodone , ice/heat  PRECAUTIONS: Fall  RED FLAGS: None   WEIGHT BEARING RESTRICTIONS: WBAT with no ROM restrictions per 12/06/23 surgery note  FALLS:  Has patient fallen in last 6 months? Yes. Number of falls 1  -- 12/05/23 resulting in her femur fracture  LIVING ENVIRONMENT: Lives with: lives with their spouse; children and grandchildren are in and out Lives in: House/apartment Stairs: 1 step up to enter Has following equipment at home: Single point cane, Quad cane small base, Walker - 2 wheeled, Grab bars, and walk in shower  OCCUPATION: Retired - loves to the [employer] and entertainment  PLOF: Independent  PATIENT GOALS: Return to cooking and normal activities without having to stop and rest  NEXT MD VISIT: 06/04/24 to see PCP  OBJECTIVE:  Note: Objective measures were completed at Evaluation unless otherwise noted.  DIAGNOSTIC FINDINGS: X-rays of the lumbar spine from 03/09/2024 were independently reviewed and interpreted, showing laminectomy defect at L3/4.  Spondylolisthesis at L3/4 that does not shift between flexion and extension views.  Posterior instrumentation at L3 and L4.  No lucency seen around the screws and none of them have backed out.  No fracture or dislocation seen.   PATIENT SURVEYS:  Lower Extremity Functional Score: 33 / 80 = 41.3 %  05/20/2024:  53/80    SENSATION: Neuropathy in bilat LEs -- knee to ankle  EDEMA:  Will swell if legs are down  MUSCLE LENGTH: Hamstrings: See knee AROM below  POSTURE: flexed trunk , weight shift left, and knees flexed  PALPATION: TTP medial knee/adductors  LOWER EXTREMITY ROM:  Active ROM Right eval Left eval Right 05/01/24 Left 05/01/24  Hip flexion sitting 95 PROM WNL in sup At least 110 deg 95 supine 110 supine  Hip extension      Hip abduction      Hip adduction      Hip internal rotation      Hip external rotation      Knee flexion sitting 80 110 85 110  Knee extension sitting -32 PROM can get -10 in sup -10 -9 -6  Ankle dorsiflexion      Ankle plantarflexion      Ankle inversion      Ankle eversion       (  Blank rows = not tested)  LOWER EXTREMITY MMT:   MMT Right eval Left eval  Hip flexion 3- 3+  Hip extension    Hip abduction supine 3+ 3+  Hip adduction    Hip internal rotation    Hip external rotation    Knee flexion 4 4  Knee extension 3- 3+  Ankle dorsiflexion    Ankle plantarflexion    Ankle inversion    Ankle eversion     (Blank rows = not tested)  LOWER EXTREMITY SPECIAL TESTS:  Hip special tests: Belvie (FABER) test: positive  and Thomas test: positive   FUNCTIONAL TESTS:  5 times sit to stand: with UE support 49.86 sec 05/20/2024: 15m, UE support from mat, height parallel with arm chair : 300' w/ FWW 08/18/24: 274 feet with FWW  GAIT: Distance walked: Into clinic Assistive device utilized: Environmental Consultant - 2 wheeled Level of assistance: Modified independence Comments: Diminished swing phase on R, bilat steppage gait, diminished heel/toe on R, flexed trunk and knees  [facility] Adult PT Treatment:                                                DATE: 08/18/24 Therapeutic Activity Nustep 9 min Standing blue rocker fwd back with UE AIREX pad wide base stance 3-5 sec trials  Seated calf stretch with strap  Therapeutic Activity: 6 MWT 274 feet  with FWW     [facility] Adult PT Treatment:                                                DATE: 08/13/24 Therapeutic Exercise: Nustep L5 Ue/LE a 7 min Seated rocker board Seated calf stretch with strap  LAQ 10 x 2 each   Neuromuscular re-ed: Wide stance balance 30 sec x 3  Narrow stance balance needs 1 UE x 30 sec  Therapeutic Activity: STS 5 x 2 elevated surface  Standing march, hip abduction 10x 2 each  Heel raise      [facility] Adult PT Treatment:                                                DATE: 08/10/24  Nustep L5 UE/LE x 7 min Standing heel raise Standing march Standing alternating hip abduction  Forearms on counter alternating hip extension Mini squat at counter x 10 STS elevated seated ,  hands on RW to sit 5 x 2  Supine march - difficult   [facility] Adult PT Treatment:                                                DATE: 08/06/24 Therapeutic Exercise/Activity: Nustep L5 UE/LE x 5 minutes  STS 2x5 c use of hands from bari-mat LAQ x10 c strap x10 R, s strap L Seated hip add sets c ball x15 Seated hip abd GTB x15 Heel raise 10x2 Standing hip abd 2x10  [facility] Adult PT Treatment:  DATE: 07/31/24 Therapeutic Exercise/Activity: Nustep L5 UE/LE x 5 minutes  Lateral 4' step up 2x10 each c use of hands Side steps over low blocks c use of hands STS x10 c use of hands from bari-mat LAQ x10 c strap x10 R, s strap L Heel raise 10x2 Standing hip abd 2x10 2#  PATIENT EDUCATION:  Education details: Exam findings, POC, initial HEP Person educated: Patient Education method: Explanation, Demonstration, and Handouts Education comprehension: verbalized understanding, returned demonstration, and needs further education  HOME EXERCISE PROGRAM: Access Code: [email or web address] URL: [email or web address] Date: 04/23/2024 Prepared by: Gellen April Marie Nonato  Exercises - Seated Heel Slide  - 1 x daily - 7 x weekly - 2 sets - 10  reps - Seated Quad Set  - 1 x daily - 7 x weekly - 2 sets - 10 reps - 3 sec hold - Seated Hip Abduction with Resistance  - 2 x daily - 7 x weekly - 1 sets - 10 reps - 3 sec hold - Seated Hip Flexion Isometric  - 2 x daily - 7 x weekly - 1 sets - 10 reps - 3 sec hold - Seated Active Hip Flexion  - 2 x daily - 7 x weekly - 1 sets - 10 reps - Beginner Bridge  - 2 x daily - 7 x weekly - 1 sets - 10 reps  ASSESSMENT:  CLINICAL IMPRESSION: Pt tolerated exs for LE strengthening with out adverse effects. Continued standing balance on unlevel surface which provided significant challenge. Captured 6 MWT which was found to be decreased since last measured. She has one more covered visit and would like to HOLD PT and discuss her progress with MD in early January. Will plan to check LTGS.     Recert 07/08/24: Assessed 5xSTS, 6 MWT, and walking tolerance. 5xSTS is significantly declined as well as her . Pt's walking tolerance, walking distance, has improved, but not the pace. Pt reports functioning in her home is better c bed mobility, sitting to standing, and standing tolerance in the kitchen. Pt's progress in PT has been very slow, but gains are being made. Progress has been impacted by the the significant weakness of the R hip and knee seeming related to the multiple surgeries she has had on this leg. A FWW continues to be the most appropriate AD at this time. Pt will continue to benefit from skilled PT 2w8 to address impairments for improved functional mobility and safety at home.    Progress note 05/20/2024: Pt attended physical therapy session for re-evaluation of R hip ORIF. Pt is progressing well and has met 6 goals, pt continues to work towards 2 other initial goals. Difficulties continue with RLE strength, and transfer quality necessary for a safe return to independent lifestyle inline with PLOF. 2 additional goals were added to further progress quality of AROM and ambulation quality necessary for  return to higher level community participation. Pt required minimal cuing as well as no assistance for safe and appropriate performance of today's activities. Continue with therapeutic focus on RLE strengthening, quadriceps recruitment,ambulation quality, and transfer quality. Education was given to continue applying ADL education from previous sessions as well as performing HEP as prescribed with freedom to progress as tolerated using previous education on modification and exercise dosage. Pt has displayed and verbalized competence regarding this education.   Pt continues to require the intervention of skilled outpatient physical therapy to address the aforementioned deficits and progress towards a functional level in line with therapeutic goals.  OBJECTIVE IMPAIRMENTS: Abnormal gait, decreased activity tolerance, decreased balance, decreased coordination, decreased endurance, decreased mobility, difficulty walking, decreased ROM, decreased strength, hypomobility, increased fascial restrictions, impaired flexibility, improper body mechanics, postural dysfunction, and pain.   ACTIVITY LIMITATIONS: carrying, lifting, bending, sitting, standing, squatting, sleeping, stairs, transfers, bed mobility, dressing, hygiene/grooming, and locomotion level  PARTICIPATION LIMITATIONS: meal prep, cleaning, driving, shopping, and community activity  PERSONAL FACTORS: Age, Fitness, Past/current experiences, and Time since onset of injury/illness/exacerbation are also affecting patient's functional outcome.   REHAB POTENTIAL: Good  CLINICAL DECISION MAKING: Evolving/moderate complexity  EVALUATION COMPLEXITY: Moderate   GOALS: Goals reviewed with patient? Yes  SHORT TERM GOALS: Target date: 05/13/2024  Pt will be ind with initial HEP Baseline: Goal status: MET Pt reports adherence 05/15/24  2.  Pt will have improved hip flexion to at least 110 deg AROM Baseline:  Goal status: MET (in supine, and seated  AAROM)  3.  Pt will report improved medial thigh/knee pain by >/=25% Baseline:  Goal status: MET 05/15/24: Pt reports max pain at 6/10   LONG TERM GOALS: Target date: 07/21/2024   Pt will be ind with management and progression of HEP Baseline:  Goal status: MET 05/20/2024  2.  Pt will be able to amb at least 400' with LRAD for increased home and limited community mobility Baseline:  Goal status: MET 05/20/2024 (walked around 500') 07/08/24= 600'  3.  Pt will have improved 5x STS to </=20 sec to reduce her fall risk and increase functional LE strength Baseline: 35.6 c use of hands to RW Goal status: ONGOING  4.  Pt will have improved R knee ext to within at least 10 deg of full extension to demo increased quad strength Baseline: 32 deg away from full ext 07/08/24: 30d from full ext Goal status: ONGOING  5.  Pt will have improved LEFS score to >/=42/80 to demo MCID Baseline: 33/80 05/20/2024: 53/80 Goal status: MET 05/20/2024  6. Pt will improve hip flexion to 110d AROM in seated   Baseline: 110d AAROM  07/08/24: 90d  Goal status:ONGOING  7.  Pt will independently ambulate 500' with LRAD within 6 minutes to demonstrate improved weightbearing tolerance, BLE strength, and functional capacity for community ambulation.  Baseline: - 300' FWW  07/08/24: 300' FWW  08/18/24: 274 FWW  Goal status: ONGOING   PLAN:  PT FREQUENCY: 2x/week  PT DURATION: 8 weeks  PLANNED INTERVENTIONS: 97164- PT Re-evaluation, 97750- Physical Performance Testing, 97110-Therapeutic exercises, 97530- Therapeutic activity, [id number]- Neuromuscular re-education, 97535- Self Care, 02859- Manual therapy, [phone]- Gait training, [phone]- Aquatic Therapy, [phone]- Electrical stimulation (unattended), 97016- Vasopneumatic device, [id number]- Ultrasound, [id number]- Ionotophoresis 4mg /ml Dexamethasone , 79439 (1-2 muscles), 20561 (3+ muscles)- Dry Needling, Patient/Family education, Balance training, Stair training, Taping,  Joint mobilization, Spinal mobilization, Vestibular training, Cryotherapy, and Moist heat  PLAN FOR NEXT SESSION: Continue with therapeutic focus on RLE strengthening, quadriceps recruitment,ambulation quality, and transfer quality.   Harlene Persons, PTA 08/18/2024 1:20 PM Phone: [phone] Fax: [phone]   "

## 2024-08-18 NOTE — Telephone Encounter (Signed)
" °  FYI Only or Action Required?: Action required by provider: request for pain medication for leg pain. Advised by physical therapy to call.  Patient was last seen in primary care on 08/03/2024 by Mercer Clotilda SAUNDERS, MD.  Called Nurse Triage reporting Leg Pain.  Symptoms began several months ago.  Interventions attempted: OTC medications: tylenol .  Symptoms are: stable.  Triage Disposition: See PCP When Office is Open (Within 3 Days)  Patient/caregiver understands and will follow disposition?: yes   Copied from CRM #[id number]. Topic: Clinical - Red Word Triage >> Aug 18, 2024 11:26 AM Renee Watts wrote: Red Word that prompted transfer to Nurse Triage: Pain in right leg that's getting worse Reason for Disposition  Prescription request for new medicine (not a refill)  Answer Assessment - Initial Assessment Questions 1. NAME of MEDICINE: What medicine(s) are you calling about?     Requesting pain medication. States spoken with PCP at last visit was told to call back if needed. 2. QUESTION: What is your question? (e.g., double dose of medicine, side effect)     Requesting pain medication  4. SYMPTOMS: Do you have any symptoms? If Yes, ask: What symptoms are you having?  How bad are the symptoms (e.g., mild, moderate, severe)     Ongoing leg pain from fracture  Protocols used: Medication Question Call-A-AH  "

## 2024-08-18 NOTE — Progress Notes (Signed)
 I sent in some Tramadol 

## 2024-08-18 NOTE — Telephone Encounter (Signed)
 I sent in some Tramadol 

## 2024-08-21 ENCOUNTER — Ambulatory Visit

## 2024-08-25 NOTE — Therapy (Signed)
 " OUTPATIENT PHYSICAL THERAPY TREATMENT/DC   Patient Name: Renee Watts MRN: [id number] DOB:April 29, 1951, 73 y.o., female Today's Date: 08/26/2024  END OF SESSION:  PT End of Session - 08/26/24 1102     Visit Number 30    Number of Visits 32    Date for Recertification  09/11/24    Authorization Type [employer]- 30 visit limit    Authorization - Visit Number 30    Authorization - Number of Visits 30    Progress Note Due on Visit 30    PT Start Time 1102    PT Stop Time 1144    PT Time Calculation (min) 42 min    Activity Tolerance Patient tolerated treatment well    Behavior During Therapy WFL for tasks assessed/performed               Past Medical History:  Diagnosis Date   Allergy    Benign paroxysmal positional vertigo 06/25/2013   dx with prior PCP, s/p labs, mri and vestibular rehab; nasal spray for etd seemed to help   Blood in stool 03/11/2012   Dependent edema    Bilateral   Depression    GERD (gastroesophageal reflux disease)    on meds   Hyperlipidemia    on meds   Low back pain    sciatica; sees orthopedic specialist ([facility])for this and shoulder tendinitis   Obesity, diabetes, and hypertension syndrome (HCC)    on med   Osteoarthritis, knee    Bilateral, s/p 3 total knee replacement surgerues on right, last 2005. Dr. Duwayne   Pellegrini-Steida syndrome    Myositis ossificans   Seasonal allergies    Shingles 07/10/2012   LESIONS CLEARED BUT NOT TOLERATING ANY THING TIGHT AGAINST BODY--SHINGLES WERE AROSS BACK AND ABDOMEN   Past Surgical History:  Procedure Laterality Date   ABDOMINAL HYSTERECTOMY  1984   BLADDER SURGERY  03/2017   bladder tac per pt   COLONOSCOPY  2020   MS-MAC-goly (good)-hems/TAx5)   left shoulder steroid injection     ORIF FEMUR FRACTURE Right 12/06/2023   Procedure: OPEN REDUCTION INTERNAL FIXATION (ORIF) DISTAL FEMUR FRACTURE;  Surgeon: Kendal Franky SQUIBB, MD;  Location: [facility];  Service: Orthopedics;   Laterality: Right;   POLYPECTOMY  2020   TA x 5   TONSILLECTOMY  1977   TOTAL HIP ARTHROPLASTY Right 10/10/2012   Procedure: R TOTAL HIP ARTHROPLASTY ANTERIOR APPROACH;  Surgeon: Lonni CINDERELLA Poli, MD;  Location: [facility];  Service: Orthopedics;  Laterality: Right;  Right Total Hip Arthroplasty, Anterior Approach (C-Arm)   TOTAL KNEE ARTHROPLASTY Right    with multiple revisions, 2003, 2005   TOTAL KNEE ARTHROPLASTY Left 01/09/2013   Procedure: LEFT TOTAL KNEE ARTHROPLASTY;  Surgeon: Lonni CINDERELLA Poli, MD;  Location: [facility];  Service: Orthopedics;  Laterality: Left;   TUBAL LIGATION  1978   WISDOM TOOTH EXTRACTION  2013,2014   Patient Active Problem List   Diagnosis Date Noted   Closed fracture of distal end of right femur, unspecified fracture morphology, initial encounter (HCC) 12/05/2023   Lumbar radiculopathy 09/17/2023   Osteopenia 02/15/2021   Venous stasis of both lower extremities 06/27/2020   Bilateral lower extremity edema 02/05/2020   Recurrent sinusitis 09/03/2018   Vaginal vault prolapse after hysterectomy 02/13/2017   Obesity 01/06/2016   Low back pain 01/06/2016   Normocytic anemia 03/12/2013   Diabetic peripheral neuropathy (HCC) 09/01/2012   Hyperlipemia 09/16/2006   Hypertension associated with diabetes (HCC) 09/16/2006   GERD  09/16/2006   Type II diabetes mellitus with neurological manifestations (HCC) 06/17/2006   Osteoarthritis 06/17/2006   DEPENDENT EDEMA, LEGS, BILATERAL 06/17/2006    PCP: Mercer Clotilda SAUNDERS, MD  REFERRING PROVIDER: Kendal Franky SQUIBB, MD  REFERRING DIAG: ORIF of right distal fracture performed on 12/06/23  THERAPY DIAG:  Chronic pain of left knee  Chronic pain of right knee  Difficulty in walking, not elsewhere classified  Localized edema  Muscle weakness (generalized)  Rationale for Evaluation and Treatment: Rehabilitation  ONSET DATE: 12/06/23  SUBJECTIVE:   SUBJECTIVE STATEMENT: Pt reports she is being diligent  with the completion of her HE, completing daily.  From eval: Pt states she has been doing home health PT since her 12/06/23 hip surgery. Was d/c-ed ~1 month ago. Was able to walk 500 feet with PT. Pt reports pain is more in the inside of her medial thigh. No pain where the incision is laterally. Has been using quad cane intermittently in the house. Pt is fearful of another fall. Pt notes she gets fatigued with prolonged standing but not limited by pain.   PERTINENT HISTORY: L3/4 laminectomy and PSIF on 09/17/23; neuropathy in legs, bilat knee replacement, R hip replacement   PAIN:  Are you having pain? Yes: NPRS scale: 6 currently for thigh muscles; at worst 10/10 Pain location: R medial thigh Pain description: sometimes sharp or achy Aggravating factors: Rolling in bed, stepping wrong, getting up quickly Relieving factors: hydrocodone , ice/heat  PRECAUTIONS: Fall  RED FLAGS: None   WEIGHT BEARING RESTRICTIONS: WBAT with no ROM restrictions per 12/06/23 surgery note  FALLS:  Has patient fallen in last 6 months? Yes. Number of falls 1  -- 12/05/23 resulting in her femur fracture  LIVING ENVIRONMENT: Lives with: lives with their spouse; children and grandchildren are in and out Lives in: House/apartment Stairs: 1 step up to enter Has following equipment at home: Single point cane, Quad cane small base, Walker - 2 wheeled, Grab bars, and walk in shower  OCCUPATION: Retired - loves to the [employer] and entertainment  PLOF: Independent  PATIENT GOALS: Return to cooking and normal activities without having to stop and rest  NEXT MD VISIT: 06/04/24 to see PCP  OBJECTIVE:  Note: Objective measures were completed at Evaluation unless otherwise noted.  DIAGNOSTIC FINDINGS: X-rays of the lumbar spine from 03/09/2024 were independently reviewed and interpreted, showing laminectomy defect at L3/4.  Spondylolisthesis at L3/4 that does not shift between flexion and extension views.  Posterior  instrumentation at L3 and L4.  No lucency seen around the screws and none of them have backed out.  No fracture or dislocation seen.   PATIENT SURVEYS:  Lower Extremity Functional Score: 33 / 80 = 41.3 %  05/20/2024: 53/80    SENSATION: Neuropathy in bilat LEs -- knee to ankle  EDEMA:  Will swell if legs are down  MUSCLE LENGTH: Hamstrings: See knee AROM below  POSTURE: flexed trunk , weight shift left, and knees flexed  PALPATION: TTP medial knee/adductors  LOWER EXTREMITY ROM:  Active ROM Right eval Left eval Right 05/01/24 Left 05/01/24  Hip flexion sitting 95 PROM WNL in sup At least 110 deg 95 supine 110 supine  Hip extension      Hip abduction      Hip adduction      Hip internal rotation      Hip external rotation      Knee flexion sitting 80 110 85 110  Knee extension sitting -32 PROM can get -10 in  sup -10 -9 -6  Ankle dorsiflexion      Ankle plantarflexion      Ankle inversion      Ankle eversion       (Blank rows = not tested)  LOWER EXTREMITY MMT:   MMT Right eval Left eval  Hip flexion 3- 3+  Hip extension    Hip abduction supine 3+ 3+  Hip adduction    Hip internal rotation    Hip external rotation    Knee flexion 4 4  Knee extension 3- 3+  Ankle dorsiflexion    Ankle plantarflexion    Ankle inversion    Ankle eversion     (Blank rows = not tested)  LOWER EXTREMITY SPECIAL TESTS:  Hip special tests: Belvie (FABER) test: positive  and Thomas test: positive   FUNCTIONAL TESTS:  5 times sit to stand: with UE support 49.86 sec 05/20/2024: 27m, UE support from mat, height parallel with arm chair : 300' w/ FWW 08/18/24: 274 feet with FWW  GAIT: Distance walked: Into clinic Assistive device utilized: Environmental Consultant - 2 wheeled Level of assistance: Modified independence Comments: Diminished swing phase on R, bilat steppage gait, diminished heel/toe on R, flexed trunk and knees  [facility] Adult PT Treatment:                                                 DATE: 08/26/24 Therapeutic Exercise/Activity Nustep 9 min  LAQs c strap assist for concentric 2x10 5xSTS AIREX pad wide base stance 3-5 sec trials  Standing hip abd 2x10 at counter Standing heel raises 2x10 Review of HEP  [facility] Adult PT Treatment:                                                DATE: 08/18/24 Therapeutic Activity Nustep 9 min Standing blue rocker fwd back with UE AIREX pad wide base stance 3-5 sec trials  Seated calf stretch with strap  Therapeutic Activity: 6 MWT 274 feet with FWW  PATIENT EDUCATION:  Education details: Exam findings, POC, initial HEP Person educated: Patient Education method: Explanation, Demonstration, and Handouts Education comprehension: verbalized understanding, returned demonstration, and needs further education  HOME EXERCISE PROGRAM: Access Code: [email or web address] URL: [email or web address] Date: 04/23/2024 Prepared by: Gellen April Marie Nonato  Exercises - Seated Heel Slide  - 1 x daily - 7 x weekly - 2 sets - 10 reps - Seated Quad Set  - 1 x daily - 7 x weekly - 2 sets - 10 reps - 3 sec hold - Seated Hip Abduction with Resistance  - 2 x daily - 7 x weekly - 1 sets - 10 reps - 3 sec hold - Seated Hip Flexion Isometric  - 2 x daily - 7 x weekly - 1 sets - 10 reps - 3 sec hold - Seated Active Hip Flexion  - 2 x daily - 7 x weekly - 1 sets - 10 reps - Beginner Bridge  - 2 x daily - 7 x weekly - 1 sets - 10 reps  ASSESSMENT:  CLINICAL IMPRESSION: Pt completed her course of PT today. Pt has worked very hard to improve her LE strength and function, however due to chronic weakness, progress in these areas  has been limited. Pt is Ind in a HEP to address her strength to maintain her current functional level. Pt's tolerance to standing and walking has improved, but she continues to need the use of a RW to be safe with ambulation. Pt is in agreement with DC at this time.   Recert 07/08/24: Assessed 5xSTS, 6 MWT, and walking  tolerance. 5xSTS is significantly declined as well as her . Pt's walking tolerance, walking distance, has improved, but not the pace. Pt reports functioning in her home is better c bed mobility, sitting to standing, and standing tolerance in the kitchen. Pt's progress in PT has been very slow, but gains are being made. Progress has been impacted by the the significant weakness of the R hip and knee seeming related to the multiple surgeries she has had on this leg. A FWW continues to be the most appropriate AD at this time. Pt will continue to benefit from skilled PT 2w8 to address impairments for improved functional mobility and safety at home.    Progress note 05/20/2024: Pt attended physical therapy session for re-evaluation of R hip ORIF. Pt is progressing well and has met 6 goals, pt continues to work towards 2 other initial goals. Difficulties continue with RLE strength, and transfer quality necessary for a safe return to independent lifestyle inline with PLOF. 2 additional goals were added to further progress quality of AROM and ambulation quality necessary for return to higher level community participation. Pt required minimal cuing as well as no assistance for safe and appropriate performance of today's activities. Continue with therapeutic focus on RLE strengthening, quadriceps recruitment,ambulation quality, and transfer quality. Education was given to continue applying ADL education from previous sessions as well as performing HEP as prescribed with freedom to progress as tolerated using previous education on modification and exercise dosage. Pt has displayed and verbalized competence regarding this education.   Pt continues to require the intervention of skilled outpatient physical therapy to address the aforementioned deficits and progress towards a functional level in line with therapeutic goals.  OBJECTIVE IMPAIRMENTS: Abnormal gait, decreased activity tolerance, decreased balance, decreased  coordination, decreased endurance, decreased mobility, difficulty walking, decreased ROM, decreased strength, hypomobility, increased fascial restrictions, impaired flexibility, improper body mechanics, postural dysfunction, and pain.   ACTIVITY LIMITATIONS: carrying, lifting, bending, sitting, standing, squatting, sleeping, stairs, transfers, bed mobility, dressing, hygiene/grooming, and locomotion level  PARTICIPATION LIMITATIONS: meal prep, cleaning, driving, shopping, and community activity  PERSONAL FACTORS: Age, Fitness, Past/current experiences, and Time since onset of injury/illness/exacerbation are also affecting patient's functional outcome.   REHAB POTENTIAL: Good  CLINICAL DECISION MAKING: Evolving/moderate complexity  EVALUATION COMPLEXITY: Moderate   GOALS: Goals reviewed with patient? Yes  SHORT TERM GOALS: Target date: 05/13/2024  Pt will be ind with initial HEP Baseline: Goal status: MET Pt reports adherence 05/15/24  2.  Pt will have improved hip flexion to at least 110 deg AROM Baseline:  Goal status: MET (in supine, and seated AAROM)  3.  Pt will report improved medial thigh/knee pain by >/=25% Baseline:  Goal status: MET 05/15/24: Pt reports max pain at 6/10   LONG TERM GOALS: Target date: 07/21/2024   Pt will be ind with management and progression of HEP Baseline:  Goal status: MET 05/20/2024  2.  Pt will be able to amb at least 400' with LRAD for increased home and limited community mobility Baseline:  Goal status: MET 05/20/2024 (walked around 500') 07/08/24= 600'  3.  Pt will have improved 5x STS to </=  20 sec to reduce her fall risk and increase functional LE strength Baseline: 35.6 c use of hands to RW 08/26/24: 40.2 c use of hands to RW Goal status: Not MET  4.  Pt will have improved R knee ext to within at least 10 deg of full extension to demo increased quad strength Baseline: 32 deg away from full ext 07/08/24: 30d from full  ext 08/26/24:33d away from full ext Goal status: ONGOING  5.  Pt will have improved LEFS score to >/=42/80 to demo MCID Baseline: 33/80 05/20/2024: 53/80 Goal status: MET 05/20/2024  6. Pt will improve hip flexion to 110d AROM in seated   Baseline: 110d AAROM  07/08/24: 90d  08/26/24: 90d  Goal status:ONGOING  7.  Pt will independently ambulate 500' with LRAD within 6 minutes to demonstrate improved weightbearing tolerance, BLE strength, and functional capacity for community ambulation.  Baseline: - 300' FWW  07/08/24: 300' FWW  08/18/24: 274 FWW  Goal status: Not MET   PLAN:  PT FREQUENCY: 2x/week  PT DURATION: 8 weeks  PLANNED INTERVENTIONS: 97164- PT Re-evaluation, 97750- Physical Performance Testing, 97110-Therapeutic exercises, 97530- Therapeutic activity, 97112- Neuromuscular re-education, 97535- Self Care, 02859- Manual therapy, [phone]- Gait training, [phone]- Aquatic Therapy, [phone]- Electrical stimulation (unattended), 97016- Vasopneumatic device, [id number]- Ultrasound, [id number]- Ionotophoresis 4mg /ml Dexamethasone , 79439 (1-2 muscles), 20561 (3+ muscles)- Dry Needling, Patient/Family education, Balance training, Stair training, Taping, Joint mobilization, Spinal mobilization, Vestibular training, Cryotherapy, and Moist heat  PLAN FOR NEXT SESSION: Continue with therapeutic focus on RLE strengthening, quadriceps recruitment,ambulation quality, and transfer quality.   PHYSICAL THERAPY DISCHARGE SUMMARY  Visits from Start of Care: 30  Current functional level related to goals / functional outcomes: See clinical impression and PT goals   Remaining deficits: See clinical impression and PT goals   Education / Equipment: HEP/Pt Ed   Patient agrees to discharge. Patient goals were partially met. Patient is being discharged due to maximized rehab potential.    Dasie Daft MS, PT 08/26/2024 11:24 AM   "

## 2024-08-26 ENCOUNTER — Ambulatory Visit

## 2024-08-26 DIAGNOSIS — M6281 Muscle weakness (generalized): Secondary | ICD-10-CM

## 2024-08-26 DIAGNOSIS — G8929 Other chronic pain: Secondary | ICD-10-CM

## 2024-08-26 DIAGNOSIS — R262 Difficulty in walking, not elsewhere classified: Secondary | ICD-10-CM

## 2024-08-26 DIAGNOSIS — M79604 Pain in right leg: Secondary | ICD-10-CM | POA: Diagnosis not present

## 2024-08-26 DIAGNOSIS — R600 Localized edema: Secondary | ICD-10-CM

## 2024-08-30 ENCOUNTER — Other Ambulatory Visit: Payer: Self-pay | Admitting: Family Medicine

## 2024-08-30 DIAGNOSIS — F4323 Adjustment disorder with mixed anxiety and depressed mood: Secondary | ICD-10-CM

## 2024-08-31 LAB — POCT GLYCOSYLATED HEMOGLOBIN (HGB A1C): Hemoglobin A1C: 7.2 % — AB (ref 4.0–5.6)

## 2024-09-05 ENCOUNTER — Other Ambulatory Visit: Payer: Self-pay | Admitting: Family Medicine

## 2024-09-05 DIAGNOSIS — M25471 Effusion, right ankle: Secondary | ICD-10-CM

## 2024-09-05 DIAGNOSIS — I10 Essential (primary) hypertension: Secondary | ICD-10-CM

## 2024-09-09 ENCOUNTER — Ambulatory Visit: Admitting: Orthopedic Surgery

## 2024-09-15 ENCOUNTER — Telehealth: Payer: Self-pay

## 2024-09-15 NOTE — Telephone Encounter (Signed)
 Copied from CRM [id number]. Topic: Clinical - Medication Question >> Sep 15, 2024 10:55 AM Joesph NOVAK wrote: Reason for CRM: patient states her pcp took her off potassium and furosemide  (LASIX ) 40 MG tablet [[id number]]. She mentioned it was called in to the pharmacy so she wants to know if pcp wants her to take it again.  Please call her.

## 2024-09-15 NOTE — Telephone Encounter (Signed)
 Copied from CRM [id number]. Topic: Clinical - Medication Question >> Sep 15, 2024 10:55 AM Joesph NOVAK wrote: Reason for CRM: patient states her pcp took her off potassium and furosemide  (LASIX ) 40 MG tablet [[id number]]. She mentioned it was called in to the pharmacy so she wants to know if pcp wants her to take it again.  Please call her.

## 2024-09-16 NOTE — Addendum Note (Signed)
 Addended by: MERCER CLOTILDA SAUNDERS on: 09/16/2024 06:12 PM   Modules accepted: Orders

## 2024-09-16 NOTE — Telephone Encounter (Signed)
 No pt does not need to pick them up.  The request for the medications came from the pharmacy.  Will remove them from med list.

## 2024-09-17 NOTE — Telephone Encounter (Signed)
 Spoke with patient she aware

## 2024-09-22 ENCOUNTER — Ambulatory Visit: Payer: Self-pay

## 2024-09-22 NOTE — Telephone Encounter (Signed)
 Appt 12/8

## 2024-09-22 NOTE — Telephone Encounter (Signed)
 FYI Only or Action Required?: FYI only for provider: appointment scheduled on 09/23/24.  Patient was last seen in primary care on 08/03/2024 by Mercer Clotilda SAUNDERS, MD.  Called Nurse Triage reporting Sinusitis.  Symptoms began several weeks ago.  Interventions attempted: levocetirizine (XYZAL ) .  Symptoms are: gradually worsening.  Triage Disposition: See Physician Within 24 Hours  Patient/caregiver understands and will follow disposition?: Yes  Reason for Disposition  Earache  Answer Assessment - Initial Assessment Questions Patient states that she has been feeling sinus pressure in her face to neck for about 2 weeks now and nothing is relieving it. She also reports intermittent earache. Office visit advised.   1. LOCATION: Where does it hurt?      Starts in nose and goes down to left side of ear to the neck  2. ONSET: When did the sinus pain start?  (e.g., hours, days)      About 2 weeks ago  3. SEVERITY: How bad is the pain?   (Scale 0-10; or none, mild, moderate or severe)     9/10 pressure/discomfort  4. RECURRENT SYMPTOM: Have you ever had sinus problems before? If Yes, ask: When was the last time? and What happened that time?      Unknown  5. NASAL CONGESTION: Is the nose blocked? If Yes, ask: Can you open it or must you breathe through your mouth?     Unknown  6. NASAL DISCHARGE: Do you have discharge from your nose? If so ask, What color?     Unknown  7. FEVER: Do you have a fever? If Yes, ask: What is it, how was it measured, and when did it start?      No  8. OTHER SYMPTOMS: Do you have any other symptoms? (e.g., sore throat, cough, earache, difficulty breathing)     Intermittent earache  9. PREGNANCY: Is there any chance you are pregnant? When was your last menstrual period?     NA  Protocols used: Sinus Pain or Congestion-A-AH  Reason for Triage: patient states she's having a sinus pressure going down the left side her face to  her ear down her neck, ongoing for 2 weeks been getting worse, also states a dark spot on her right foot there's pain to it every now an then

## 2024-09-23 ENCOUNTER — Ambulatory Visit: Admitting: Family Medicine

## 2024-09-28 ENCOUNTER — Ambulatory Visit: Admitting: Family Medicine

## 2024-10-01 ENCOUNTER — Encounter: Payer: Self-pay | Admitting: Family Medicine

## 2024-10-01 ENCOUNTER — Ambulatory Visit: Admitting: Family Medicine

## 2024-10-01 VITALS — BP 120/64 | HR 67 | Temp 98.2°F | Ht 65.0 in | Wt 161.8 lb

## 2024-10-01 DIAGNOSIS — M1990 Unspecified osteoarthritis, unspecified site: Secondary | ICD-10-CM

## 2024-10-01 DIAGNOSIS — E1149 Type 2 diabetes mellitus with other diabetic neurological complication: Secondary | ICD-10-CM

## 2024-10-01 DIAGNOSIS — S90811A Abrasion, right foot, initial encounter: Secondary | ICD-10-CM

## 2024-10-01 DIAGNOSIS — M62838 Other muscle spasm: Secondary | ICD-10-CM

## 2024-10-01 DIAGNOSIS — H66002 Acute suppurative otitis media without spontaneous rupture of ear drum, left ear: Secondary | ICD-10-CM

## 2024-10-01 DIAGNOSIS — R0981 Nasal congestion: Secondary | ICD-10-CM

## 2024-10-01 MED ORDER — TRAMADOL HCL 50 MG PO TABS: 100.0000 mg | ORAL_TABLET | Freq: Two times a day (BID) | ORAL | 1 refills | Status: AC | PRN

## 2024-10-01 MED ORDER — AMOXICILLIN-POT CLAVULANATE 500-125 MG PO TABS: 1.0000 | ORAL_TABLET | Freq: Two times a day (BID) | ORAL | 0 refills | Status: AC

## 2024-10-01 MED ORDER — METHOCARBAMOL 500 MG PO TABS: 500.0000 mg | ORAL_TABLET | Freq: Every day | ORAL | 0 refills | Status: AC | PRN

## 2024-10-01 NOTE — Progress Notes (Signed)
 "  Established Acute Office Visit   Subjective  Patient ID: Renee Watts, female    DOB: December 03, 1950  Age: 74 y.o. MRN: [id number]  Chief Complaint  Patient presents with   Acute Visit    Patient came in today for a sinus, congestion , face(left Side) and ear pressure started 2 1/2 weeks ago,   Patient also has a sore on the right foot lateral side of the foot, started a month ago, black, patient denies any pain    Pt is a 74 year old female seen for acute concern.  Patient endorses nasal congestion, facial pressure, dark areas under her eyes and on side of nose x 2-1/2 weeks.  Patient unable to be seen sooner due to weather.  Patient also with left ear pain.  Denies fever, chills, nausea, vomiting, sore throat, cough.  Tried Xyzal  which did not help symptoms.  Patient requesting a refill on tramadol .  Using sparingly for history of low back pain/lumbar radiculopathy.  Also requesting refill on muscle relaxer as suggested by PT.  States has difficulty in PT/with exercises of LEs due to muscle spasms of left knee.  Advised by Ortho to wean off of using walker.  Using cane around the house.  Patient also mentions a sore on right foot.  Denies injury, drainage, or pain.  Wears bedroom shoes in the house.  Unsure if shoe was rubbing on side of foot.  Gets regular pedicures, last 1 was 6 weeks ago.    Patient Active Problem List   Diagnosis Date Noted   Closed fracture of distal end of right femur, unspecified fracture morphology, initial encounter (HCC) 12/05/2023   Lumbar radiculopathy 09/17/2023   Osteopenia 02/15/2021   Venous stasis of both lower extremities 06/27/2020   Bilateral lower extremity edema 02/05/2020   Recurrent sinusitis 09/03/2018   Vaginal vault prolapse after hysterectomy 02/13/2017   Obesity 01/06/2016   Low back pain 01/06/2016   Normocytic anemia 03/12/2013   Diabetic peripheral neuropathy (HCC) 09/01/2012   Hyperlipemia 09/16/2006   Hypertension associated with  diabetes (HCC) 09/16/2006   GERD 09/16/2006   Type II diabetes mellitus with neurological manifestations (HCC) 06/17/2006   Osteoarthritis 06/17/2006   DEPENDENT EDEMA, LEGS, BILATERAL 06/17/2006   Past Medical History:  Diagnosis Date   Allergy    Benign paroxysmal positional vertigo 06/25/2013   dx with prior PCP, s/p labs, mri and vestibular rehab; nasal spray for etd seemed to help   Blood in stool 03/11/2012   Dependent edema    Bilateral   Depression    GERD (gastroesophageal reflux disease)    on meds   Hyperlipidemia    on meds   Low back pain    sciatica; sees orthopedic specialist ([facility])for this and shoulder tendinitis   Obesity, diabetes, and hypertension syndrome (HCC)    on med   Osteoarthritis, knee    Bilateral, s/p 3 total knee replacement surgerues on right, last 2005. Dr. Duwayne   Pellegrini-Steida syndrome    Myositis ossificans   Seasonal allergies    Shingles 07/10/2012   LESIONS CLEARED BUT NOT TOLERATING ANY THING TIGHT AGAINST BODY--SHINGLES WERE AROSS BACK AND ABDOMEN   Past Surgical History:  Procedure Laterality Date   ABDOMINAL HYSTERECTOMY  1984   BLADDER SURGERY  03/2017   bladder tac per pt   COLONOSCOPY  2020   MS-MAC-goly (good)-hems/TAx5)   left shoulder steroid injection     ORIF FEMUR FRACTURE Right 12/06/2023   Procedure: OPEN REDUCTION INTERNAL FIXATION (  ORIF) DISTAL FEMUR FRACTURE;  Surgeon: Kendal Franky SQUIBB, MD;  Location: [facility];  Service: Orthopedics;  Laterality: Right;   POLYPECTOMY  2020   TA x 5   TONSILLECTOMY  1977   TOTAL HIP ARTHROPLASTY Right 10/10/2012   Procedure: R TOTAL HIP ARTHROPLASTY ANTERIOR APPROACH;  Surgeon: Lonni CINDERELLA Poli, MD;  Location: [facility];  Service: Orthopedics;  Laterality: Right;  Right Total Hip Arthroplasty, Anterior Approach (C-Arm)   TOTAL KNEE ARTHROPLASTY Right    with multiple revisions, 2003, 2005   TOTAL KNEE ARTHROPLASTY Left 01/09/2013   Procedure: LEFT TOTAL KNEE  ARTHROPLASTY;  Surgeon: Lonni CINDERELLA Poli, MD;  Location: [facility];  Service: Orthopedics;  Laterality: Left;   TUBAL LIGATION  1978   WISDOM TOOTH EXTRACTION  2013,2014   Social History[1] Family History  Problem Relation Age of Onset   Diabetes Mother    Hypertension Mother    Hypertension Father    Cancer Father        Lung CA   Coronary artery disease Father    Colon cancer Neg Hx    Colon polyps Neg Hx    Esophageal cancer Neg Hx    Rectal cancer Neg Hx    Stomach cancer Neg Hx    Allergies[2]  ROS Negative unless stated above    Objective:     BP 120/64 (BP Location: Left Arm, Patient Position: Sitting, Cuff Size: Large)   Pulse 67   Temp 98.2 F (36.8 C) (Oral)   Ht 5' 5 (1.651 m)   Wt 161 lb 12.8 oz (73.4 kg)   SpO2 95%   BMI 26.92 kg/m  BP Readings from Last 3 Encounters:  10/01/24 120/64  08/03/24 130/84  06/04/24 122/76   Wt Readings from Last 3 Encounters:  10/01/24 161 lb 12.8 oz (73.4 kg)  08/03/24 157 lb 6.4 oz (71.4 kg)  06/04/24 153 lb 3.2 oz (69.5 kg)      Physical Exam Constitutional:      General: She is not in acute distress.    Appearance: Normal appearance.  HENT:     Head: Normocephalic and atraumatic.     Ears:     Comments: Right TM full.  Left TM full with mild erythema and suppurative fluid.    Nose: Nose normal.     Mouth/Throat:     Mouth: Mucous membranes are moist.  Cardiovascular:     Rate and Rhythm: Normal rate and regular rhythm.     Heart sounds: Normal heart sounds. No murmur heard.    No gallop.  Pulmonary:     Effort: Pulmonary effort is normal. No respiratory distress.     Breath sounds: Normal breath sounds. No wheezing, rhonchi or rales.  Skin:    General: Skin is warm and dry.     Comments: Lateral right midfoot with a 5 mm area of eschar.  No drainage, erythema, induration, or tenderness.  No foreign objects present.  Small area of callus on distal edge of eschar.  Ruddy discoloration of bilateral  LEs.  Neurological:     Mental Status: She is alert and oriented to person, place, and time.     Comments: Ambulating with walker.    Diabetic Foot Exam - Simple   Simple Foot Form Diabetic Foot exam was performed with the following findings: Yes 10/01/2024 10:58 AM  Visual Inspection No deformities, no ulcerations, no other skin breakdown bilaterally: Yes Sensation Testing Intact to touch and monofilament testing bilaterally: Yes Pulse Check Posterior  Tibialis and Dorsalis pulse intact bilaterally: Yes Comments Healing abrasion of Lateral R foot.         10/01/2024   11:29 AM 10/01/2024   10:57 AM 08/03/2024   11:50 AM  Depression screen PHQ 2/9  Decreased Interest 0 2 2  Down, Depressed, Hopeless 0 2 2  PHQ - 2 Score 0 4 4  Altered sleeping 3 3 3   Tired, decreased energy 3 3 3   Change in appetite 3 3 3   Feeling bad or failure about yourself  1 1 0  Trouble concentrating 0 0 1  Moving slowly or fidgety/restless 0 0 0  Suicidal thoughts 0 0 0  PHQ-9 Score 10 14 14   Difficult doing work/chores  Not difficult at all Somewhat difficult      10/01/2024   10:57 AM 08/03/2024   11:51 AM 06/04/2024   10:58 AM 03/04/2024   10:33 AM  GAD 7 : Generalized Anxiety Score  Nervous, Anxious, on Edge 1 2  1  1    Control/stop worrying 3 2  1  1    Worry too much - different things 3 2  1  1    Trouble relaxing 2 2  1  1    Restless 0 0  1  0   Easily annoyed or irritable 2 1  1  1    Afraid - awful might happen 2 2  1  2    Total GAD 7 Score 13 11 7 7   Anxiety Difficulty Not difficult at all  Somewhat difficult      Data saved with a previous flowsheet row definition     No results found for any visits on 10/01/24.    Assessment & Plan:   Acute suppurative otitis media of left ear without spontaneous rupture of tympanic membrane, recurrence not specified -     Amoxicillin -Pot Clavulanate; Take 1 tablet by mouth in the morning and at bedtime for 7 days.  Dispense: 14 tablet; Refill:  0  Type II diabetes mellitus with neurological manifestations (HCC)  Nasal congestion  Arthritis -     traMADol  HCl; Take 2 tablets (100 mg total) by mouth every 12 (twelve) hours as needed for severe pain (pain score 7-10).  Dispense: 60 tablet; Refill: 1  Abrasion of right foot, initial encounter  Muscle spasm -     Methocarbamol ; Take 1 tablet (500 mg total) by mouth daily as needed for muscle spasms.  Dispense: 28 tablet; Refill: 0  Left AOM.  Start ABX.  Tylenol  or ibuprofen as needed.  Continue supportive care for nasal congestion with OTC medications for people with HTN and DM.  DM 2 controlled.  A1c 7.2% on 08/31/2024.  Continue lifestyle modifications.  Continue Jardiance  10 mg daily,.  Continue ACEI.  Foot exam done this visit.  Eye exam done 02/19/2024.  Abrasion of right foot stable.  Healing well.  Continue supportive care.  Continue to monitor.  PDMP reviewed and appropriate.  Tramadol  as needed sparingly for severe low back/leg pain.  Limited supply of muscle relaxer as needed to help with muscle spasms during PT.  Return in about 4 months (around 01/29/2025), or if symptoms worsen or fail to improve.   Clotilda JONELLE Single, MD      [1]  Social History Tobacco Use   Smoking status: Never   Smokeless tobacco: Never  Vaping Use   Vaping status: Never Used  Substance Use Topics   Alcohol  use: No   Drug use: No  [2] No Known  Allergies  "

## 2024-10-12 ENCOUNTER — Ambulatory Visit: Admitting: Orthopedic Surgery

## 2024-11-02 ENCOUNTER — Ambulatory Visit: Admitting: Family Medicine

## 2025-03-26 ENCOUNTER — Ambulatory Visit
